# Patient Record
Sex: Female | Born: 1970
Health system: Southern US, Community
[De-identification: ages and names within clinical notes are randomized; demographics above are authoritative.]

## PROBLEM LIST (undated history)

## (undated) DIAGNOSIS — R87619 Unspecified abnormal cytological findings in specimens from cervix uteri: Secondary | ICD-10-CM

## (undated) DIAGNOSIS — N390 Urinary tract infection, site not specified: Secondary | ICD-10-CM

## (undated) DIAGNOSIS — N200 Calculus of kidney: Secondary | ICD-10-CM

## (undated) DIAGNOSIS — F419 Anxiety disorder, unspecified: Secondary | ICD-10-CM

## (undated) DIAGNOSIS — L570 Actinic keratosis: Secondary | ICD-10-CM

## (undated) DIAGNOSIS — G35 Multiple sclerosis: Secondary | ICD-10-CM

## (undated) HISTORY — DX: Anxiety disorder, unspecified: F41.9

## (undated) HISTORY — DX: Urinary tract infection, site not specified: N39.0

## (undated) HISTORY — DX: Calculus of kidney: N20.0

## (undated) HISTORY — DX: Unspecified abnormal cytological findings in specimens from cervix uteri: R87.619

## (undated) HISTORY — DX: Multiple sclerosis: G35

## (undated) HISTORY — DX: Actinic keratosis: L57.0

---

## 2004-03-27 HISTORY — PX: TONSILLECTOMY: SUR1361

## 2004-07-29 ENCOUNTER — Ambulatory Visit: Payer: Self-pay | Admitting: Otolaryngology

## 2006-02-26 ENCOUNTER — Ambulatory Visit: Payer: Self-pay

## 2006-04-26 ENCOUNTER — Inpatient Hospital Stay: Payer: Self-pay | Admitting: Obstetrics & Gynecology

## 2008-03-27 HISTORY — PX: BREAST BIOPSY: SHX20

## 2008-04-14 DIAGNOSIS — C4491 Basal cell carcinoma of skin, unspecified: Secondary | ICD-10-CM

## 2008-04-14 HISTORY — DX: Basal cell carcinoma of skin, unspecified: C44.91

## 2008-05-25 ENCOUNTER — Ambulatory Visit: Payer: Self-pay | Admitting: General Surgery

## 2009-05-19 ENCOUNTER — Ambulatory Visit: Payer: Self-pay | Admitting: Internal Medicine

## 2010-03-27 HISTORY — PX: BREAST BIOPSY: SHX20

## 2010-04-04 ENCOUNTER — Ambulatory Visit: Payer: Self-pay | Admitting: General Surgery

## 2010-12-07 ENCOUNTER — Ambulatory Visit: Payer: Self-pay | Admitting: General Surgery

## 2012-02-13 ENCOUNTER — Ambulatory Visit: Payer: Self-pay | Admitting: Internal Medicine

## 2012-04-05 ENCOUNTER — Ambulatory Visit: Payer: Self-pay | Admitting: Neurology

## 2012-06-26 ENCOUNTER — Encounter: Payer: Self-pay | Admitting: Neurology

## 2012-07-25 ENCOUNTER — Encounter: Payer: Self-pay | Admitting: Neurology

## 2012-09-17 ENCOUNTER — Other Ambulatory Visit: Payer: Self-pay | Admitting: Gastroenterology

## 2012-09-17 LAB — CLOSTRIDIUM DIFFICILE BY PCR

## 2012-09-19 LAB — STOOL CULTURE

## 2013-03-14 ENCOUNTER — Ambulatory Visit: Payer: Self-pay | Admitting: Family Medicine

## 2013-04-18 ENCOUNTER — Ambulatory Visit: Payer: Self-pay | Admitting: Neurology

## 2013-04-18 LAB — HCG, QUANTITATIVE, PREGNANCY: Beta Hcg, Quant.: <1 m[IU]/mL — ABNORMAL LOW

## 2014-01-14 ENCOUNTER — Encounter: Payer: Self-pay | Admitting: Neurology

## 2014-01-25 ENCOUNTER — Encounter: Payer: Self-pay | Admitting: Neurology

## 2014-02-24 ENCOUNTER — Encounter: Payer: Self-pay | Admitting: Neurology

## 2014-03-27 ENCOUNTER — Encounter: Payer: Self-pay | Admitting: Neurology

## 2014-09-01 ENCOUNTER — Other Ambulatory Visit: Payer: Self-pay | Admitting: Neurology

## 2014-09-01 DIAGNOSIS — G35 Multiple sclerosis: Secondary | ICD-10-CM

## 2014-09-11 ENCOUNTER — Ambulatory Visit: Payer: Self-pay

## 2014-09-24 ENCOUNTER — Encounter: Payer: BLUE CROSS/BLUE SHIELD | Attending: Neurology | Admitting: Dietician

## 2014-09-24 ENCOUNTER — Encounter: Payer: Self-pay | Admitting: Dietician

## 2014-09-24 VITALS — Ht 69.0 in | Wt 180.4 lb

## 2014-09-24 DIAGNOSIS — R635 Abnormal weight gain: Secondary | ICD-10-CM | POA: Insufficient documentation

## 2014-09-24 DIAGNOSIS — E669 Obesity, unspecified: Secondary | ICD-10-CM

## 2014-09-24 NOTE — Progress Notes (Signed)
Medical Nutrition Therapy: Visit start time: 13:30 end time: 14:30  Assessment:  Diagnosis: unexplained weight gain Past medical history: MS diagnosed in 2014 Psychosocial issues/ stress concerns: rates her stress as moderate and indicates "ok" as to how she is dealing with her stress. Preferred learning method:  . Audio Current weight: 180.4lbs  Height: 69 in Medications, supplements: see list Progress and evaluation: Patient in for initial nutrition assessment. Reports that despite efforts of exercise and improved diet habits, she has continued to gain weight.Documented gain of 5 lbs in past month. She is interested in a meal plan to promote weight loss but wants to lose weight in a "healthy way". Her current diet is low in fruits/vegetables. There is often more than 6 hours between lunch and dinner making it difficult to control portions at dinner. Continues to make frequent high fat choices. Physical activity: 30 minutes of walking daily. Has recently added 30 minutes on her stationary bike.  Dietary Intake:  Usual eating pattern includes 3 meals and 1 snacks per day. Dining out frequency: 2 meals per week.  Breakfast: 10:00am- granola bar (2 in pkg), water, 8oz. Coffee with 1 tbsp sugar Lunch: 12:00 - hamburger, chips, potato salad, coleslaw or roast beef sandwich with cheese, mustard, mayonnaise, carrots, veggie dip, Supper: rice/beans/broccoli for example or salmon, starch, non-starchy vegetables Snack: ice cream, coffee with sugar Beverages: water, unsweetened tea and coffee (2 c./day)  Nutrition Care Education: Weight control:Instructed on a meal plan based on 1500 calories, including carbohydrate counting and balance of carbohydrate, protein and fat. Discussed how to adjust portions of current meals and ways to increase fruit/vegetable intake. Also, instructed on reading labels. Exercise: Commended on her exercise and suggested some light weight bearing exercises to help with  maintaining muscle mass. Nutritional Diagnosis:  [place]-3.4 Unintentional weight gain As related to higher fat food choices, added fats and large portions at evening meals.  As evidenced by weight gain.  Intervention:  Balance meals with 2-3 servings of carbohydrate, protein and non-starchy vegetables. Spread 10 servings of carbohydrate over the day. Include at least 1 oz. protein at breakfast. Add a small serving of fruit when possible at breakfast. Add an afternoon snack that consists of 1 oz. Protein and 1 serving of carbohydrate. Include at least 5 servings of fruits/veg. daily. Limit wine to 5 oz. on Friday and Saturday. Limit sugar in coffee to 1 tsp. In each of 2 cups.  Education Materials given:  . Food lists/ Planning A Balanced Meal . Sample meal pattern/ menus . Goals/ instructions  Learner/ who was taught:  . Patient   Level of understanding: . Partial understanding; needs review/ practice Learning barriers: . None  Willingness to learn/ readiness for change: . Eager, change in progress  Monitoring and Evaluation:  Dietary intake, exercise,, and body weight      follow up: July 25,2016

## 2014-09-24 NOTE — Patient Instructions (Signed)
Balance meals with 2-3 servings of carbohydrate, protein and non-starchy vegetables. Include at least 1 oz. protein at breakfast. Add a small serving of fruit when possible. Add an afternoon snack that consists of 1 oz. Protein and 1 serving of carbohydrate. Include at least 5 servings of fruits/veg. daily. Limit wine to 5 oz. on Friday and Saturday. Limit sugar in coffee to 1 tsp. In each of 2 cups.

## 2014-10-06 ENCOUNTER — Ambulatory Visit
Admission: RE | Admit: 2014-10-06 | Discharge: 2014-10-06 | Disposition: A | Discharge status: Home / Self Care | Payer: BLUE CROSS/BLUE SHIELD | Source: Ambulatory Visit | Attending: Neurology | Admitting: Neurology

## 2014-10-06 DIAGNOSIS — G35 Multiple sclerosis: Secondary | ICD-10-CM | POA: Insufficient documentation

## 2014-10-06 DIAGNOSIS — G959 Disease of spinal cord, unspecified: Secondary | ICD-10-CM | POA: Insufficient documentation

## 2014-10-06 MED ORDER — GADOBENATE DIMEGLUMINE 529 MG/ML IV SOLN
20.0000 mL | Freq: Once | INTRAVENOUS | Status: AC | PRN
  Administered 2014-10-06: 16 mL via INTRAVENOUS

## 2014-10-19 ENCOUNTER — Encounter: Payer: Self-pay | Admitting: Dietician

## 2014-10-19 ENCOUNTER — Encounter: Payer: BLUE CROSS/BLUE SHIELD | Attending: Neurology | Admitting: Dietician

## 2014-10-19 VITALS — Wt 176.2 lb

## 2014-10-19 DIAGNOSIS — R635 Abnormal weight gain: Secondary | ICD-10-CM

## 2014-10-19 NOTE — Progress Notes (Signed)
Medical Nutrition Therapy Follow-up visit:  Time:13:00 - 13:55 Visit #: 2 ASSESSMENT:    Current weight:176.2 lbs    Height:69 in Medications: See list Medical History: MS since 2014 Progress and evaluation: Patient has lost 4.4 lbs since previous visit 1 month ago. She has followed through with goals to better balance meals, increase vegetables, decrease added sugar (she now adds stevia to her coffee instead of sugar), and to include an afternoon snack to help control portions at dinner meals. She states she has not had a cheeseburger since her last visit. She has added [place] yogurt to her breakfast which she states is helping to be less hungry at lunch.  Physical activity: walks on days that are not excessively hot. The heat aggravates her MS symptoms. She rides her exercise bike daily for 30 minutes.   NUTRITION CARE EDUCATION:  Weight control: Commended patient on following through with goals from initial visit. Reviewed 1500 calorie meal plan instructed on at initial visit. Discussed ways to increase fiber through more whole grains and fruits/vegetables. Also, discussed calcium needs and sources.  INTERVENTION:  Increase fiber by increasing whole grains: Ex. Fiber One Clusters,   100% whole wheat bread, Quinoa, corn, brown or wild rice, or 100% whole wheat pasta Include at least 1 cup of vegetables at dinner. Add stevia to strawberries to help sweeten it. Include yogurt 2x/day. Continue with previous goals.   EDUCATION MATERIALS GIVEN:  . Goals/ instructions . Other : List of foods and fiber content  LEARNER/ who was taught:  . Patient   LEVEL OF UNDERSTANDING: . Verbalizes/ demonstrates competency LEARNING BARRIERS: . None  WILLINGNESS TO LEARN/READINESS FOR CHANGE: . Eager, change in progress MONITORING AND EVALUATION:  Diet, exercise and weight Follow-up: 11/18/14 at 8:00am.

## 2014-10-19 NOTE — Patient Instructions (Signed)
Increase fiber by increasing whole grains: Ex. Fiber One Clusters,   100% whole wheat bread, Quinoa, corn, brown or wild rice, or 100% whole wheat pasta Include at least 1 cup of vegetables at dinner. Add stevia to strawberries to help sweeten it. Include yogurt 2x/day.

## 2014-11-18 ENCOUNTER — Encounter: Payer: Self-pay | Admitting: Dietician

## 2014-11-18 ENCOUNTER — Encounter: Payer: BLUE CROSS/BLUE SHIELD | Attending: Neurology | Admitting: Dietician

## 2014-11-18 VITALS — Ht 69.0 in | Wt 173.5 lb

## 2014-11-18 DIAGNOSIS — R635 Abnormal weight gain: Secondary | ICD-10-CM | POA: Insufficient documentation

## 2014-11-18 NOTE — Progress Notes (Signed)
Medical Nutrition Therapy Follow-up visit:  Time:8:00-8:45 Visit #:3 ASSESSMENT:  Diagnosis:unexplained weight gain  Current weight: 173.5 lbs    Height: 69 in Medications: See list Medical History: MS since 2014 Progress and evaluation:Patient has lost 2.7 lbs in past month. She has followed through with previous goals set to increase fiber by increasing whole grain foods. She tried Fiber One Clusters and states "I love that cereal". She eats the cereal for evening snack. She eats a vegetable serving at lunch (carrots) and includes 1 to 1.5 cups of vegetables for her evening meal. She is including yogurt, cheese and milk daily for 3 servings of milk product/day. She is meeting protein needs through her milk products, lean meats at lunch and dinner and a protein bar for afternoon snack. Physical activity:walks 30 minutes per day; also uses exercise bike 30 minutes/day.   NUTRITION CARE EDUCATION:  Weight control: Commended patient on her continued efforts to lose weight through healthy diet habits and exercise. Reviewed food group servings needed and compared to her current intake. Also, discussed benefit of adding weight bearing exercises to help maintain muscle over time.  INTERVENTION:  Consider adding weight bearing exercises 3 days per week. Continue with previous goals. Add 1 fruit serving per day.  EDUCATION MATERIALS GIVEN:  . Goals/ instructions LEARNER/ who was taught:  . Patient   LEVEL OF UNDERSTANDING: . Verbalizes/ demonstrates competency LEARNING BARRIERS: . None  WILLINGNESS TO LEARN/READINESS FOR CHANGE: . Eager, change in progress MONITORING AND EVALUATION:  Follow-up to monitor diet, weight, exercise Sept. 28th at 8:00am.

## 2014-11-18 NOTE — Patient Instructions (Signed)
Consider adding weight bearing exercises 3 days per week. Continue with previous goals. Add 1 fruit serving per day.

## 2014-12-23 ENCOUNTER — Ambulatory Visit: Payer: BLUE CROSS/BLUE SHIELD | Admitting: Dietician

## 2015-01-14 ENCOUNTER — Ambulatory Visit: Payer: BLUE CROSS/BLUE SHIELD | Admitting: Dietician

## 2015-01-28 ENCOUNTER — Encounter: Payer: Self-pay | Admitting: Dietician

## 2015-08-27 DIAGNOSIS — F411 Generalized anxiety disorder: Secondary | ICD-10-CM | POA: Diagnosis not present

## 2015-08-27 DIAGNOSIS — F330 Major depressive disorder, recurrent, mild: Secondary | ICD-10-CM | POA: Diagnosis not present

## 2015-08-27 DIAGNOSIS — F3281 Premenstrual dysphoric disorder: Secondary | ICD-10-CM | POA: Diagnosis not present

## 2015-10-06 ENCOUNTER — Other Ambulatory Visit: Payer: Self-pay

## 2015-10-06 ENCOUNTER — Ambulatory Visit: Payer: BLUE CROSS/BLUE SHIELD

## 2015-10-25 DIAGNOSIS — Z79899 Other long term (current) drug therapy: Secondary | ICD-10-CM | POA: Diagnosis not present

## 2015-10-25 DIAGNOSIS — G35 Multiple sclerosis: Secondary | ICD-10-CM | POA: Diagnosis not present

## 2015-11-23 DIAGNOSIS — F330 Major depressive disorder, recurrent, mild: Secondary | ICD-10-CM | POA: Diagnosis not present

## 2015-11-23 DIAGNOSIS — F3281 Premenstrual dysphoric disorder: Secondary | ICD-10-CM | POA: Diagnosis not present

## 2015-11-23 DIAGNOSIS — F411 Generalized anxiety disorder: Secondary | ICD-10-CM | POA: Diagnosis not present

## 2015-12-07 DIAGNOSIS — Z1283 Encounter for screening for malignant neoplasm of skin: Secondary | ICD-10-CM | POA: Diagnosis not present

## 2015-12-07 DIAGNOSIS — L700 Acne vulgaris: Secondary | ICD-10-CM | POA: Diagnosis not present

## 2015-12-07 DIAGNOSIS — L219 Seborrheic dermatitis, unspecified: Secondary | ICD-10-CM | POA: Diagnosis not present

## 2015-12-07 DIAGNOSIS — Z85828 Personal history of other malignant neoplasm of skin: Secondary | ICD-10-CM | POA: Diagnosis not present

## 2015-12-07 DIAGNOSIS — L820 Inflamed seborrheic keratosis: Secondary | ICD-10-CM | POA: Diagnosis not present

## 2015-12-30 DIAGNOSIS — Z23 Encounter for immunization: Secondary | ICD-10-CM | POA: Diagnosis not present

## 2016-02-24 DIAGNOSIS — F411 Generalized anxiety disorder: Secondary | ICD-10-CM | POA: Diagnosis not present

## 2016-02-24 DIAGNOSIS — N3000 Acute cystitis without hematuria: Secondary | ICD-10-CM | POA: Diagnosis not present

## 2016-02-24 DIAGNOSIS — F330 Major depressive disorder, recurrent, mild: Secondary | ICD-10-CM | POA: Diagnosis not present

## 2016-02-24 DIAGNOSIS — F3281 Premenstrual dysphoric disorder: Secondary | ICD-10-CM | POA: Diagnosis not present

## 2016-02-24 DIAGNOSIS — N390 Urinary tract infection, site not specified: Secondary | ICD-10-CM | POA: Diagnosis not present

## 2016-02-25 DIAGNOSIS — N3000 Acute cystitis without hematuria: Secondary | ICD-10-CM | POA: Diagnosis not present

## 2016-02-25 DIAGNOSIS — N390 Urinary tract infection, site not specified: Secondary | ICD-10-CM | POA: Diagnosis not present

## 2016-03-06 DIAGNOSIS — N390 Urinary tract infection, site not specified: Secondary | ICD-10-CM | POA: Diagnosis not present

## 2016-03-06 DIAGNOSIS — R300 Dysuria: Secondary | ICD-10-CM | POA: Diagnosis not present

## 2016-03-14 DIAGNOSIS — F330 Major depressive disorder, recurrent, mild: Secondary | ICD-10-CM | POA: Diagnosis not present

## 2016-03-14 DIAGNOSIS — F3281 Premenstrual dysphoric disorder: Secondary | ICD-10-CM | POA: Diagnosis not present

## 2016-03-14 DIAGNOSIS — F411 Generalized anxiety disorder: Secondary | ICD-10-CM | POA: Diagnosis not present

## 2016-04-26 DIAGNOSIS — G35 Multiple sclerosis: Secondary | ICD-10-CM | POA: Diagnosis not present

## 2016-04-26 DIAGNOSIS — Z79899 Other long term (current) drug therapy: Secondary | ICD-10-CM | POA: Diagnosis not present

## 2016-04-27 DIAGNOSIS — F3281 Premenstrual dysphoric disorder: Secondary | ICD-10-CM | POA: Diagnosis not present

## 2016-04-27 DIAGNOSIS — F411 Generalized anxiety disorder: Secondary | ICD-10-CM | POA: Diagnosis not present

## 2016-04-27 DIAGNOSIS — F330 Major depressive disorder, recurrent, mild: Secondary | ICD-10-CM | POA: Diagnosis not present

## 2016-05-16 DIAGNOSIS — F411 Generalized anxiety disorder: Secondary | ICD-10-CM | POA: Diagnosis not present

## 2016-05-16 DIAGNOSIS — F3281 Premenstrual dysphoric disorder: Secondary | ICD-10-CM | POA: Diagnosis not present

## 2016-05-16 DIAGNOSIS — F330 Major depressive disorder, recurrent, mild: Secondary | ICD-10-CM | POA: Diagnosis not present

## 2016-06-20 DIAGNOSIS — L281 Prurigo nodularis: Secondary | ICD-10-CM | POA: Diagnosis not present

## 2016-06-20 DIAGNOSIS — D485 Neoplasm of uncertain behavior of skin: Secondary | ICD-10-CM | POA: Diagnosis not present

## 2016-06-20 DIAGNOSIS — Z85828 Personal history of other malignant neoplasm of skin: Secondary | ICD-10-CM | POA: Diagnosis not present

## 2016-06-20 DIAGNOSIS — Z1283 Encounter for screening for malignant neoplasm of skin: Secondary | ICD-10-CM | POA: Diagnosis not present

## 2016-06-20 DIAGNOSIS — D229 Melanocytic nevi, unspecified: Secondary | ICD-10-CM | POA: Diagnosis not present

## 2016-06-20 DIAGNOSIS — L578 Other skin changes due to chronic exposure to nonionizing radiation: Secondary | ICD-10-CM | POA: Diagnosis not present

## 2016-07-04 ENCOUNTER — Encounter: Payer: Self-pay | Admitting: Obstetrics and Gynecology

## 2016-07-04 ENCOUNTER — Ambulatory Visit (INDEPENDENT_AMBULATORY_CARE_PROVIDER_SITE_OTHER): Payer: BLUE CROSS/BLUE SHIELD | Admitting: Obstetrics and Gynecology

## 2016-07-04 VITALS — BP 124/82 | HR 94 | Ht 69.0 in | Wt 184.0 lb

## 2016-07-04 DIAGNOSIS — Z1231 Encounter for screening mammogram for malignant neoplasm of breast: Secondary | ICD-10-CM | POA: Diagnosis not present

## 2016-07-04 DIAGNOSIS — Z01419 Encounter for gynecological examination (general) (routine) without abnormal findings: Secondary | ICD-10-CM

## 2016-07-04 DIAGNOSIS — Z1239 Encounter for other screening for malignant neoplasm of breast: Secondary | ICD-10-CM

## 2016-07-04 NOTE — Progress Notes (Signed)
Chief Complaint  Patient presents with  . Gynecologic Exam     HPI:      [name] is a 46 y.o. G2P1011 who LMP was Patient's last menstrual period was 06/30/2016., presents today for her annual examination.  Her menses are regular every 25 days, lasting 4 days.  Dysmenorrhea none. She does not have intermenstrual bleeding.  Sex activity: single partner, contraception - vasectomy.  Last Pap: April 15, 2015  Results were: no abnormalities /neg HPV DNA  Hx of STDs: none  Last mammogram: April 15, 2015  Results were: normal--routine follow-up in 12 months There is no FH of breast cancer. There is no FH of ovarian cancer. The patient does not do self-breast exams.  Tobacco use: The patient denies current or previous tobacco use. Alcohol use: none Exercise: moderately active  She does get adequate calcium and Vitamin D in her diet.  Her UTI sx from 11/17 and 12/17 have resolved.   Past Medical History:  Diagnosis Date  . Abnormal Pap smear of cervix   . Anxiety   . Kidney stones   . Neuromuscular disorder [facility])     Past Surgical History:  Procedure Laterality Date  . CESAREAN SECTION  2008   [facility]    Family History  Problem Relation Age of Onset  . Endometrial cancer Mother 71  . Multiple sclerosis Sister 48  . Autism Son     Infantile  . Diabetes type II Other     Aunt   Social History   Social History  . Marital status: Married    Spouse name: N/A  . Number of children: N/A  . Years of education: N/A   Occupational History  . Not on file.   Social History Main Topics  . Smoking status: Never Smoker  . Smokeless tobacco: Never Used  . Alcohol use 2.4 oz/week    4 Glasses of wine per week  . Drug use: No  . Sexual activity: Yes    Birth control/ protection: Surgical     Comment: Vasectomy   Other Topics Concern  . Not on file   Social History Narrative  . No narrative on file    Current Outpatient Prescriptions:  .  baclofen  (LIORESAL) 10 MG tablet, 10 mg 2 (two) times daily., Disp: , Rfl:  .  buPROPion (WELLBUTRIN) 100 MG tablet, Take 100 mg by mouth 2 (two) times daily., Disp: , Rfl:  .  Cholecalciferol (PA VITAMIN D-3) 2000 UNITS CAPS, Take 2,000 Units by mouth., Disp: , Rfl:  .  cyanocobalamin 2000 MCG tablet, Take 2,000 mcg by mouth daily., Disp: , Rfl:  .  dalfampridine (AMPYRA) 10 MG TB12, Take 10 mg by mouth 2 (two) times daily., Disp: , Rfl:  .  Dimethyl Fumarate (TECFIDERA) 240 MG CPDR, 240 mg 1 day or 1 dose., Disp: , Rfl:  .  sertraline (ZOLOFT) 50 MG tablet, Take 100 mg by mouth., Disp: , Rfl:   ROS:  Review of Systems  Constitutional: Negative for fever, malaise/fatigue and weight loss.  HENT: Negative for congestion, ear pain and sinus pain.   Respiratory: Negative for cough, shortness of breath and wheezing.   Cardiovascular: Negative for chest pain, orthopnea and leg swelling.  Gastrointestinal: Negative for constipation, diarrhea, nausea and vomiting.  Genitourinary: Negative for dysuria, frequency, hematuria and urgency.       Breast ROS: negative   Musculoskeletal: Negative for back pain, joint pain and myalgias.  Skin: Negative for itching and  rash.  Neurological: Negative for dizziness, tingling, focal weakness and headaches.  Endo/Heme/Allergies: Negative for environmental allergies. Does not bruise/bleed easily.  Psychiatric/Behavioral: Negative for depression and suicidal ideas. The patient is not nervous/anxious and does not have insomnia.     Objective: BP 124/82 (BP Location: Left Arm, Patient Position: Sitting, Cuff Size: Normal)   Pulse 94   Ht 5\' 9"  (1.753 m)   Wt 184 lb (83.5 kg)   LMP 06/30/2016   BMI 27.17 kg/m    Physical Exam  Constitutional: She is oriented to person, place, and time. She appears well-developed and well-nourished.  Genitourinary: Vagina normal and uterus normal. No erythema or tenderness in the vagina. No vaginal discharge found. Right adnexum  does not display mass and does not display tenderness. Left adnexum does not display mass and does not display tenderness. Cervix does not exhibit motion tenderness or polyp. Uterus is not enlarged or tender.  Neck: Normal range of motion. No thyromegaly present.  Cardiovascular: Normal rate, regular rhythm and normal heart sounds.   No murmur heard. Pulmonary/Chest: Effort normal and breath sounds normal. Right breast exhibits no mass, no nipple discharge, no skin change and no tenderness. Left breast exhibits no mass, no nipple discharge, no skin change and no tenderness.  Abdominal: Soft. There is no tenderness. There is no guarding.  Musculoskeletal: Normal range of motion.  Neurological: She is alert and oriented to person, place, and time. No cranial nerve deficit.  Psychiatric: She has a normal mood and affect. Her behavior is normal.  Vitals reviewed.   Assessment/Plan: Encounter for annual routine gynecological examination  Screening for breast cancer - Plan: MM DIGITAL SCREENING BILATERAL              GYN counsel mammography screening, menopause, adequate intake of calcium and vitamin D     F/U  No Follow-up on file.  Tobey Schmelzle B. Nino Amano, PA-C 07/04/2016 2:12 PM

## 2016-07-18 ENCOUNTER — Ambulatory Visit
Admission: RE | Admit: 2016-07-18 | Discharge: 2016-07-18 | Disposition: A | Discharge status: Home / Self Care | Payer: BLUE CROSS/BLUE SHIELD | Source: Ambulatory Visit | Attending: Obstetrics and Gynecology | Admitting: Obstetrics and Gynecology

## 2016-07-18 DIAGNOSIS — Z1231 Encounter for screening mammogram for malignant neoplasm of breast: Secondary | ICD-10-CM | POA: Diagnosis not present

## 2016-07-18 DIAGNOSIS — Z1239 Encounter for other screening for malignant neoplasm of breast: Secondary | ICD-10-CM

## 2016-07-27 ENCOUNTER — Inpatient Hospital Stay
Admission: RE | Admit: 2016-07-27 | Discharge: 2016-07-27 | Disposition: A | Discharge status: Home / Self Care | Payer: Self-pay | Source: Ambulatory Visit | Attending: *Deleted | Admitting: *Deleted

## 2016-07-27 ENCOUNTER — Other Ambulatory Visit: Payer: Self-pay | Admitting: *Deleted

## 2016-07-27 DIAGNOSIS — Z9289 Personal history of other medical treatment: Secondary | ICD-10-CM

## 2016-08-09 DIAGNOSIS — F3281 Premenstrual dysphoric disorder: Secondary | ICD-10-CM | POA: Diagnosis not present

## 2016-08-09 DIAGNOSIS — F411 Generalized anxiety disorder: Secondary | ICD-10-CM | POA: Diagnosis not present

## 2016-08-09 DIAGNOSIS — F330 Major depressive disorder, recurrent, mild: Secondary | ICD-10-CM | POA: Diagnosis not present

## 2016-08-18 DIAGNOSIS — S0501XA Injury of conjunctiva and corneal abrasion without foreign body, right eye, initial encounter: Secondary | ICD-10-CM | POA: Diagnosis not present

## 2016-08-24 DIAGNOSIS — S0501XD Injury of conjunctiva and corneal abrasion without foreign body, right eye, subsequent encounter: Secondary | ICD-10-CM | POA: Diagnosis not present

## 2016-09-19 DIAGNOSIS — G35 Multiple sclerosis: Secondary | ICD-10-CM | POA: Diagnosis not present

## 2016-09-19 DIAGNOSIS — Z79899 Other long term (current) drug therapy: Secondary | ICD-10-CM | POA: Diagnosis not present

## 2016-09-29 ENCOUNTER — Other Ambulatory Visit: Payer: Self-pay | Admitting: Neurology

## 2016-09-29 DIAGNOSIS — G35 Multiple sclerosis: Secondary | ICD-10-CM

## 2016-10-05 ENCOUNTER — Ambulatory Visit
Admission: RE | Admit: 2016-10-05 | Discharge: 2016-10-05 | Disposition: A | Discharge status: Home / Self Care | Payer: BLUE CROSS/BLUE SHIELD | Source: Ambulatory Visit | Attending: Neurology | Admitting: Neurology

## 2016-10-05 DIAGNOSIS — G35 Multiple sclerosis: Secondary | ICD-10-CM

## 2016-10-05 DIAGNOSIS — M5021 Other cervical disc displacement,  high cervical region: Secondary | ICD-10-CM | POA: Diagnosis not present

## 2016-10-05 MED ORDER — GADOBENATE DIMEGLUMINE 529 MG/ML IV SOLN
17.0000 mL | Freq: Once | INTRAVENOUS | Status: AC | PRN
  Administered 2016-10-05: 17 mL via INTRAVENOUS

## 2016-10-23 ENCOUNTER — Encounter: Payer: Self-pay | Admitting: Urology

## 2016-10-23 ENCOUNTER — Ambulatory Visit: Payer: BLUE CROSS/BLUE SHIELD | Admitting: Urology

## 2016-10-23 VITALS — BP 125/80 | HR 91 | Ht 69.0 in | Wt 184.3 lb

## 2016-10-23 DIAGNOSIS — R3912 Poor urinary stream: Secondary | ICD-10-CM | POA: Diagnosis not present

## 2016-10-23 NOTE — Progress Notes (Signed)
10/23/2016 11:55 AM   Michelle Ruiz 10-01-1970 [id number]  Referring provider: Vladimir Crofts, MD [facility] [place], [place] [address]  Chief Complaint  Patient presents with  . New Patient (Initial Visit)    Neurogenic bladder    HPI: I was consulted to assess the patient's flow symptoms and likely neurogenic bladder. She has had multiple sclerosis for 5 years. For a number of years she has a poor flow and hesitates. She stops and starts. She has decreased sensation. She will sit and tried her relaxants and double void of varying amount. She took a medication that may have been Flomax and it causes pressure and the inability to urinate  Once the year she gets a bladder infection. She is continent. She voids every 2 or 3 hours and has nocturia 1. The patient thinks her sensation is still present but just a little bit less near the vagina and rectum  She denies a history of kidney stones or previous GU surgery. She has not had a hysterectomy  Modifying factors: There are no other modifying factors  Associated signs and symptoms: There are no other associated signs and symptoms Aggravating and relieving factors: There are no other aggravating or relieving factors Severity: Moderate Duration: Persistent   PMH: Past Medical History:  Diagnosis Date  . Abnormal Pap smear of cervix   . Anxiety   . Kidney stones   . Neuromuscular disorder [facility])     Surgical History: Past Surgical History:  Procedure Laterality Date  . BREAST BIOPSY Right 2010   stereotactic biopsy/ neg/ dr.byrnett  . BREAST BIOPSY Left 2012   neg/dr. brynett  . CESAREAN SECTION  2008   [name]    Home Medications:  Allergies as of 10/23/2016      Reactions   Amoxicillin Other (See Comments)   Other Other (See Comments)   Sulfa Antibiotics    Other reaction(s): Unknown      Medication List       Accurate as of 10/23/16 11:55 AM. Always use your most recent med  list.          AMPYRA 10 MG Tb12 Generic drug:  dalfampridine Take 10 mg by mouth 2 (two) times daily.   baclofen 10 MG tablet Commonly known as:  LIORESAL 10 mg 2 (two) times daily.   buPROPion 100 MG tablet Commonly known as:  WELLBUTRIN Take 100 mg by mouth 2 (two) times daily.   cyanocobalamin 2000 MCG tablet Take 2,000 mcg by mouth daily.   PA VITAMIN D-3 2000 units Caps Generic drug:  Cholecalciferol Take 2,000 Units by mouth.   sertraline 50 MG tablet Commonly known as:  ZOLOFT Take 100 mg by mouth.   TECFIDERA 240 MG Cpdr Generic drug:  Dimethyl Fumarate 240 mg 1 day or 1 dose.       Allergies:  Allergies  Allergen Reactions  . Amoxicillin Other (See Comments)  . Other Other (See Comments)  . Sulfa Antibiotics     Other reaction(s): Unknown    Family History: Family History  Problem Relation Age of Onset  . Endometrial cancer Mother 63  . Multiple sclerosis Sister 35  . Autism Son        Infantile  . Diabetes type II Other        Aunt  . Bladder Cancer Neg Hx   . Kidney cancer Neg Hx     Social History:  reports that she has never smoked. She has  never used smokeless tobacco. She reports that she drinks about 2.4 oz of alcohol per week . She reports that she does not use drugs.  ROS: UROLOGY Frequent Urination?: No Hard to postpone urination?: No Burning/pain with urination?: No Get up at night to urinate?: Yes Leakage of urine?: No Urine stream starts and stops?: Yes Trouble starting stream?: Yes Do you have to strain to urinate?: Yes Blood in urine?: No Urinary tract infection?: No Sexually transmitted disease?: No Injury to kidneys or bladder?: No Painful intercourse?: No Weak stream?: Yes Currently pregnant?: No Vaginal bleeding?: No Last menstrual period?: n  Gastrointestinal Nausea?: No Vomiting?: No Indigestion/heartburn?: No Diarrhea?: No Constipation?: No  Constitutional Fever: No Night sweats?: No Weight loss?:  No Fatigue?: No  Skin Skin rash/lesions?: No Itching?: No  Eyes Blurred vision?: No Double vision?: No  Ears/Nose/Throat Sore throat?: No Sinus problems?: No  Hematologic/Lymphatic Swollen glands?: No Easy bruising?: No  Cardiovascular Leg swelling?: No Chest pain?: No  Respiratory Cough?: No Shortness of breath?: No  Endocrine Excessive thirst?: No  Musculoskeletal Back pain?: No Joint pain?: No  Neurological Headaches?: No Dizziness?: No  Psychologic Depression?: No Anxiety?: No  Physical Exam: BP 125/80 (BP Location: Left Arm, Patient Position: Sitting, Cuff Size: Normal)   Pulse 91   Ht 5\' 9"  (1.753 m)   Wt 184 lb 4.8 oz (83.6 kg)   BMI 27.22 kg/m   Constitutional:  Alert and oriented, No acute distress. HEENT: [place] AT, moist mucus membranes.  Trachea midline, no masses. Cardiovascular: No clubbing, cyanosis, or edema. Respiratory: Normal respiratory effort, no increased work of breathing. GI: Abdomen is soft, nontender, nondistended, no abdominal masses GU: No CVA tenderness. No prolapse. Possible mild decrease in sensation of the introitus Skin: No rashes, bruises or suspicious lesions. Lymph: No cervical or inguinal adenopathy. Neurologic: Grossly intact, no focal deficits, moving all 4 extremities. Psychiatric: Normal mood and affect.  Laboratory Data:  Urinalysis No results found for: COLORURINE, APPEARANCEUR, LABSPEC, PHURINE, GLUCOSEU, HGBUR, BILIRUBINUR, KETONESUR, PROTEINUR, UROBILINOGEN, NITRITE, LEUKOCYTESUR  Pertinent Imaging: None  Assessment & Plan:  The patient has significant flow symptoms in keeping with a diagnosis of neurogenic bladder secondary to multiple sclerosis. The role of urodynamics and baseline renal ultrasound was discussed including the concept of high bladder pressures. She did not void today so a residual was not checked. I could not confirm from the chart that she took Flomax but she did remember that name.  Pathophysiology of retention discussed     There are no diagnoses linked to this encounter.  No Follow-up on file.  Reece Packer, MD  [facility] [address] [place],  [address] [phone]

## 2016-11-01 ENCOUNTER — Telehealth: Payer: Self-pay | Admitting: Urology

## 2016-11-01 NOTE — Telephone Encounter (Signed)
Can you get with Dr.Macdiarmid on Monday and make sure he really wanted this patient to have a RUS? He ordered on and mentioned something about it in her chart but she said she was not told about it. I told her I would have you ask and let me know on Tuesday and I will call her.  Thanks, Sharyn Lull

## 2016-11-06 NOTE — Telephone Encounter (Signed)
Per MacDiarmid No ultrasound needed yet

## 2016-11-10 DIAGNOSIS — F411 Generalized anxiety disorder: Secondary | ICD-10-CM | POA: Diagnosis not present

## 2016-11-10 DIAGNOSIS — F330 Major depressive disorder, recurrent, mild: Secondary | ICD-10-CM | POA: Diagnosis not present

## 2016-11-10 DIAGNOSIS — F3281 Premenstrual dysphoric disorder: Secondary | ICD-10-CM | POA: Diagnosis not present

## 2016-12-21 DIAGNOSIS — R3911 Hesitancy of micturition: Secondary | ICD-10-CM | POA: Diagnosis not present

## 2016-12-21 DIAGNOSIS — R351 Nocturia: Secondary | ICD-10-CM | POA: Diagnosis not present

## 2016-12-21 DIAGNOSIS — N311 Reflex neuropathic bladder, not elsewhere classified: Secondary | ICD-10-CM | POA: Diagnosis not present

## 2016-12-22 ENCOUNTER — Other Ambulatory Visit: Payer: Self-pay | Admitting: Urology

## 2016-12-30 DIAGNOSIS — Z23 Encounter for immunization: Secondary | ICD-10-CM | POA: Diagnosis not present

## 2017-01-01 ENCOUNTER — Ambulatory Visit (INDEPENDENT_AMBULATORY_CARE_PROVIDER_SITE_OTHER): Payer: BLUE CROSS/BLUE SHIELD | Admitting: Urology

## 2017-01-01 VITALS — BP 139/91 | HR 87 | Ht 69.0 in | Wt 186.9 lb

## 2017-01-01 DIAGNOSIS — N1330 Unspecified hydronephrosis: Secondary | ICD-10-CM | POA: Diagnosis not present

## 2017-01-01 MED ORDER — TAMSULOSIN HCL 0.4 MG PO CAPS: 0.4000 mg | ORAL_CAPSULE | Freq: Every day | ORAL | 11 refills | Status: DC

## 2017-01-01 NOTE — Progress Notes (Signed)
01/01/2017 9:51 AM   Michelle Ruiz 12/07/1970 [id number]  Referring provider: No referring provider defined for this encounter.  Chief Complaint  Patient presents with  . Follow-up    UDS results    HPI: I was consulted to assess the patient's flow symptoms and likely neurogenic bladder. She has had multiple sclerosis for 5 years. For a number of years she has a poor flow and hesitates. She stops and starts. She has decreased sensation. She will sit and tried her relaxants and double void of varying amount. She took a medication that may have been Flomax and it causes pressure and the inability to urinate  Once the year she gets a bladder infection. She is continent. She voids every 2 or 3 hours and has nocturia 1. The patient thinks her sensation is still present but just a little bit less near the vagina and rectum   The patient has significant flow symptoms in keeping with a diagnosis of neurogenic bladder secondary to multiple sclerosis. The role of urodynamics and baseline renal ultrasound was discussed including the concept of high bladder pressures. She did not void today so a residual was not checked. I could not confirm from the chart that she took Flomax but she did remember that name. Pathophysiology of retention discussed  Today It appears the renal ultrasound was not done as noted Frequency and flow symptoms persisting On urodynamics she did not void was catheterized for 300 mL. Bladder capacity was 578 mL. Bladder was unstable reaching a pressure of 7 cm water and she did not leak. She did not leak with a Valsalva pressure of 78 cm of water. She could not voluntarily void. She tried for a long time. She generated contraction of 6 cm of water but it was not well sustained. She does have a bashful bladder. EMG activity was unremarkable. Bladder neck descent it 1 cm. Importantly she voided 550 mL after the study. The details of the urodynamics are signed and  dictated   PMH: Past Medical History:  Diagnosis Date  . Abnormal Pap smear of cervix   . Anxiety   . Kidney stones   . Neuromuscular disorder [facility])     Surgical History: Past Surgical History:  Procedure Laterality Date  . BREAST BIOPSY Right 2010   stereotactic biopsy/ neg/ dr.byrnett  . BREAST BIOPSY Left 2012   neg/dr. brynett  . CESAREAN SECTION  2008   [facility]    Home Medications:  Allergies as of 01/01/2017      Reactions   Amoxicillin Other (See Comments)   Other Other (See Comments)   Sulfa Antibiotics    Other reaction(s): Unknown      Medication List       Accurate as of 01/01/17  9:51 AM. Always use your most recent med list.          AMPYRA 10 MG Tb12 Generic drug:  dalfampridine Take 10 mg by mouth 2 (two) times daily.   baclofen 10 MG tablet Commonly known as:  LIORESAL 10 mg 2 (two) times daily.   buPROPion 100 MG tablet Commonly known as:  WELLBUTRIN Take 100 mg by mouth 2 (two) times daily.   cyanocobalamin 2000 MCG tablet Take 2,000 mcg by mouth daily.   PA VITAMIN D-3 2000 units Caps Generic drug:  Cholecalciferol Take 2,000 Units by mouth.   sertraline 50 MG tablet Commonly known as:  ZOLOFT Take 100 mg by mouth.   TECFIDERA 240 MG Cpdr Generic drug:  Dimethyl Fumarate 240 mg 1 day or 1 dose.       Allergies:  Allergies  Allergen Reactions  . Amoxicillin Other (See Comments)  . Other Other (See Comments)  . Sulfa Antibiotics     Other reaction(s): Unknown    Family History: Family History  Problem Relation Age of Onset  . Endometrial cancer Mother 73  . Multiple sclerosis Sister 27  . Autism Son        Infantile  . Diabetes type II Other        Aunt  . Bladder Cancer Neg Hx   . Kidney cancer Neg Hx     Social History:  reports that she has never smoked. She has never used smokeless tobacco. She reports that she drinks about 2.4 oz of alcohol per week . She reports that she does not use  drugs.  ROS: UROLOGY Frequent Urination?: No Hard to postpone urination?: No Burning/pain with urination?: No Get up at night to urinate?: No Leakage of urine?: No Urine stream starts and stops?: No Trouble starting stream?: Yes Do you have to strain to urinate?: No Blood in urine?: No Urinary tract infection?: No Sexually transmitted disease?: No Injury to kidneys or bladder?: No Painful intercourse?: No Weak stream?: Yes Currently pregnant?: No Vaginal bleeding?: No Last menstrual period?: n  Gastrointestinal Nausea?: No Vomiting?: No Indigestion/heartburn?: No Diarrhea?: No Constipation?: No  Constitutional Fever: No Night sweats?: No Weight loss?: No Fatigue?: No  Skin Skin rash/lesions?: No Itching?: No  Eyes Blurred vision?: No Double vision?: No  Ears/Nose/Throat Sore throat?: No Sinus problems?: No  Hematologic/Lymphatic Swollen glands?: No Easy bruising?: No  Cardiovascular Leg swelling?: No Chest pain?: No  Respiratory Cough?: No Shortness of breath?: No  Endocrine Excessive thirst?: No  Musculoskeletal Back pain?: No Joint pain?: No  Neurological Headaches?: No Dizziness?: No  Psychologic Depression?: No Anxiety?: No  Physical Exam: BP (!) 139/91 (BP Location: Right Arm, Patient Position: Sitting, Cuff Size: Normal)   Pulse 87   Ht 5\' 9"  (1.753 m)   Wt 84.8 kg (186 lb 14.4 oz)   BMI 27.60 kg/m   Constitutional:  Alert and oriented, No acute distress.   Laboratory Data:  Urinalysis No results found for: COLORURINE, APPEARANCEUR, LABSPEC, PHURINE, GLUCOSEU, HGBUR, BILIRUBINUR, KETONESUR, PROTEINUR, UROBILINOGEN, NITRITE, LEUKOCYTESUR  Pertinent Imaging: none  Assessment & Plan:  The patient has a low pressure neurogenic bladder. It appears that her flow symptoms are from poor bladder contractility. The role of Flomax physical therapy and a baseline renal ultrasound was discussed. The patient and I both think she  never took Flomax. Prescription given. She will call when things settle down and likely see physical therapy. We will get a baseline ultrasound and I will reassess her in 6 or 7 weeks unfortunately she mentioned that I believe her sister has cancer in her appendix  There are no diagnoses linked to this encounter.  No Follow-up on file.  Reece Packer, MD  [facility] [address] [place], [place] [address] [phone]

## 2017-01-16 DIAGNOSIS — D485 Neoplasm of uncertain behavior of skin: Secondary | ICD-10-CM | POA: Diagnosis not present

## 2017-01-16 DIAGNOSIS — Z85828 Personal history of other malignant neoplasm of skin: Secondary | ICD-10-CM | POA: Diagnosis not present

## 2017-01-16 DIAGNOSIS — B078 Other viral warts: Secondary | ICD-10-CM | POA: Diagnosis not present

## 2017-02-13 DIAGNOSIS — D485 Neoplasm of uncertain behavior of skin: Secondary | ICD-10-CM | POA: Diagnosis not present

## 2017-02-13 DIAGNOSIS — L739 Follicular disorder, unspecified: Secondary | ICD-10-CM | POA: Diagnosis not present

## 2017-02-13 DIAGNOSIS — Z85828 Personal history of other malignant neoplasm of skin: Secondary | ICD-10-CM | POA: Diagnosis not present

## 2017-02-13 DIAGNOSIS — Z1283 Encounter for screening for malignant neoplasm of skin: Secondary | ICD-10-CM | POA: Diagnosis not present

## 2017-02-13 DIAGNOSIS — D229 Melanocytic nevi, unspecified: Secondary | ICD-10-CM | POA: Diagnosis not present

## 2017-02-13 DIAGNOSIS — D2371 Other benign neoplasm of skin of right lower limb, including hip: Secondary | ICD-10-CM | POA: Diagnosis not present

## 2017-02-13 DIAGNOSIS — L820 Inflamed seborrheic keratosis: Secondary | ICD-10-CM | POA: Diagnosis not present

## 2017-02-22 DIAGNOSIS — F411 Generalized anxiety disorder: Secondary | ICD-10-CM | POA: Diagnosis not present

## 2017-02-22 DIAGNOSIS — F330 Major depressive disorder, recurrent, mild: Secondary | ICD-10-CM | POA: Diagnosis not present

## 2017-02-22 DIAGNOSIS — F3281 Premenstrual dysphoric disorder: Secondary | ICD-10-CM | POA: Diagnosis not present

## 2017-02-23 ENCOUNTER — Ambulatory Visit
Admission: RE | Admit: 2017-02-23 | Discharge: 2017-02-23 | Disposition: A | Discharge status: Home / Self Care | Payer: BLUE CROSS/BLUE SHIELD | Source: Ambulatory Visit | Attending: Urology | Admitting: Urology

## 2017-02-23 DIAGNOSIS — N1330 Unspecified hydronephrosis: Secondary | ICD-10-CM | POA: Diagnosis not present

## 2017-02-26 ENCOUNTER — Encounter: Payer: Self-pay | Admitting: Urology

## 2017-02-26 ENCOUNTER — Ambulatory Visit: Payer: BLUE CROSS/BLUE SHIELD | Admitting: Urology

## 2017-02-26 VITALS — BP 150/81 | HR 90 | Ht 69.0 in | Wt 187.0 lb

## 2017-02-26 DIAGNOSIS — R3912 Poor urinary stream: Secondary | ICD-10-CM

## 2017-02-26 NOTE — Progress Notes (Signed)
02/26/2017 9:16 AM   Michelle Ruiz 17-Jul-1970 [id number]  Referring provider: No referring provider defined for this encounter.  Chief Complaint  Patient presents with  . Hydronephrosis    8 wk follow up    HPI: I was consulted to assess the patient's flow symptoms and likely neurogenic bladder. She has had multiple sclerosisfor 5 years. For a number of years she has a poor flow and hesitates. She stops and starts. She has decreased sensation. She will sit and tried her relaxants and double void of varying amount. She took a medication that may have been Flomax and it causes pressure and the inability to urinate  Once the year she gets a bladder infection. She is continent. She voids every 2 or 3 hours and has nocturia 1. The patient thinks her sensation is still present but just a little bit less near the vagina and rectum   The patient has significant flow symptoms in keeping with a diagnosis of neurogenic bladder secondary to multiple sclerosis.   On urodynamics she did not void was catheterized for 300 mL. Bladder capacity was 578 mL. Bladder was unstable reaching a pressure of 7 cm water and she did not leak. She did not leak with a Valsalva pressure of 78 cm of water. She could not voluntarily void. She tried for a long time. She generated contraction of 6 cm of water but it was not well sustained. She does have a bashful bladder. EMG activity was unremarkable. Bladder neck descent it 1 cm. Importantly she voided 550 mL after the study.  The patient has a low pressure neurogenic bladder. It appears that her flow symptoms are from poor bladder contractility. The role of Flomax physical therapy and a baseline renal ultrasound was discussed. The patient and I both think she never took Flomax. Prescription given. She will call when things settle down and likely see physical therapy. We will get a baseline ultrasound and I will reassess her in 6 or 7 weeks unfortunately she mentioned  that I believe her sister has cancer in her appendix   Today U/sound normal and PVR was 32 ML Frequency and flow symptoms are stable.    PMH: Past Medical History:  Diagnosis Date  . Abnormal Pap smear of cervix   . Anxiety   . Kidney stones   . Neuromuscular disorder [name])     Surgical History: Past Surgical History:  Procedure Laterality Date  . BREAST BIOPSY Right 2010   stereotactic biopsy/ neg/ dr.byrnett  . BREAST BIOPSY Left 2012   neg/dr. brynett  . CESAREAN SECTION  2008   [facility]    Home Medications:  Allergies as of 02/26/2017      Reactions   Amoxicillin Other (See Comments)   Other Other (See Comments)   Sulfa Antibiotics    Other reaction(s): Unknown      Medication List        Accurate as of 02/26/17  9:16 AM. Always use your most recent med list.          AMPYRA 10 MG Tb12 Generic drug:  dalfampridine Take 10 mg by mouth 2 (two) times daily.   baclofen 10 MG tablet Commonly known as:  LIORESAL 10 mg 2 (two) times daily.   buPROPion 100 MG tablet Commonly known as:  WELLBUTRIN Take 100 mg by mouth 2 (two) times daily.   cyanocobalamin 2000 MCG tablet Take 2,000 mcg by mouth daily.   PA VITAMIN D-3 2000 units Caps Generic drug:  Cholecalciferol Take 2,000 Units by mouth.   sertraline 50 MG tablet Commonly known as:  ZOLOFT Take 100 mg by mouth.   tamsulosin 0.4 MG Caps capsule Commonly known as:  FLOMAX Take 1 capsule (0.4 mg total) by mouth daily.   TECFIDERA 240 MG Cpdr Generic drug:  Dimethyl Fumarate 240 mg 1 day or 1 dose.       Allergies:  Allergies  Allergen Reactions  . Amoxicillin Other (See Comments)  . Other Other (See Comments)  . Sulfa Antibiotics     Other reaction(s): Unknown    Family History: Family History  Problem Relation Age of Onset  . Endometrial cancer Mother 43  . Multiple sclerosis Sister 39  . Autism Son        Infantile  . Diabetes type II Other        Aunt  . Bladder Cancer Neg Hx   .  Kidney cancer Neg Hx     Social History:  reports that  has never smoked. she has never used smokeless tobacco. She reports that she drinks about 2.4 oz of alcohol per week. She reports that she does not use drugs.  ROS: UROLOGY Frequent Urination?: No Hard to postpone urination?: No Burning/pain with urination?: No Get up at night to urinate?: No Leakage of urine?: No Urine stream starts and stops?: No Trouble starting stream?: No Do you have to strain to urinate?: No Blood in urine?: No Urinary tract infection?: No Sexually transmitted disease?: No Injury to kidneys or bladder?: No Painful intercourse?: No Weak stream?: No Currently pregnant?: No Vaginal bleeding?: No Last menstrual period?: n  Gastrointestinal Nausea?: No Vomiting?: No Indigestion/heartburn?: No Diarrhea?: No Constipation?: No  Constitutional Fever: No Night sweats?: No Weight loss?: No Fatigue?: No  Skin Skin rash/lesions?: No Itching?: No  Eyes Blurred vision?: No Double vision?: No  Ears/Nose/Throat Sore throat?: No Sinus problems?: No  Hematologic/Lymphatic Swollen glands?: No Easy bruising?: No  Cardiovascular Leg swelling?: No Chest pain?: No  Respiratory Cough?: No Shortness of breath?: No  Endocrine Excessive thirst?: No  Musculoskeletal Back pain?: No Joint pain?: No  Neurological Headaches?: No Dizziness?: No  Psychologic Depression?: No Anxiety?: No  Physical Exam: BP (!) 150/81   Pulse 90   Ht 5\' 9"  (1.753 m)   Wt 187 lb (84.8 kg)   LMP 02/03/2017 (Approximate)   BMI 27.62 kg/m   Constitutional:  Alert and oriented, No acute distress.  Laboratory Data:  Urinalysis No results found for: COLORURINE, APPEARANCEUR, LABSPEC, PHURINE, GLUCOSEU, HGBUR, BILIRUBINUR, KETONESUR, PROTEINUR, UROBILINOGEN, NITRITE, LEUKOCYTESUR  Pertinent Imaging: As above  Assessment & Plan: The patient does not want to take the alpha blocker or see physical therapy at  this stage.  If she does go on a pill I will see her once a year.  Otherwise I will see her as needed.  She was pleased with the reassurance  There are no diagnoses linked to this encounter.  No Follow-up on file.  Reece Packer, MD  [facility] [address], [place], [place] [address] [phone]

## 2017-03-29 DIAGNOSIS — R635 Abnormal weight gain: Secondary | ICD-10-CM | POA: Diagnosis not present

## 2017-03-29 DIAGNOSIS — G35 Multiple sclerosis: Secondary | ICD-10-CM | POA: Diagnosis not present

## 2017-03-29 DIAGNOSIS — Z79899 Other long term (current) drug therapy: Secondary | ICD-10-CM | POA: Diagnosis not present

## 2017-04-06 DIAGNOSIS — L570 Actinic keratosis: Secondary | ICD-10-CM | POA: Diagnosis not present

## 2017-04-18 ENCOUNTER — Encounter: Payer: BLUE CROSS/BLUE SHIELD | Attending: Neurology | Admitting: Dietician

## 2017-04-18 ENCOUNTER — Encounter: Payer: Self-pay | Admitting: Dietician

## 2017-04-18 VITALS — Ht 69.0 in | Wt 193.5 lb

## 2017-04-18 DIAGNOSIS — R739 Hyperglycemia, unspecified: Secondary | ICD-10-CM | POA: Diagnosis not present

## 2017-04-18 DIAGNOSIS — R635 Abnormal weight gain: Secondary | ICD-10-CM

## 2017-04-18 NOTE — Patient Instructions (Signed)
   Decrease ice cream intake at night, look for low-cal alternatives such as lowfat fudge bar, frozen fruit and vanilla yogurt, high fiber cereal, etc.   Use less creamy/ cheesy/ buttery sauces and toppings on foods -- smaller portions or lower fat alternatives.   Limit or avoid fast foods. Choose grilled chicken, salads, soups instead of burgers and fries when possible.   Track food intake using phone app -- Fitbit, MyFitnessPal, LoseIt, and Calorie [employer] are all options that offer free tracking.

## 2017-04-18 NOTE — Progress Notes (Signed)
Medical Nutrition Therapy: Visit start time: 1030  end time: 1130  Assessment:  Diagnosis: weight gain; patient reports pre-diabetes Past medical history: MS Psychosocial issues/ stress concerns: none; some history of anxiety and depression Preferred learning method:  . Visual  Current weight: 193.5lbs with shoes  Height: 5'9" Medications, supplements: reconciled list in medical record  Progress and evaluation: Patient reports weight gain of about 30lbs in past 2 years. She has stopped drinking 2 glasses of wine nightly as of 1 year ago, but replaced it with 1pt. ice cream nightly. Otherwise no significant diet or lifesltye changes. No special dieting at this time; met with RD several years ago, did weight watchers 10 years ago. Goal weight is 160lbs. Husband does grocery shopping and meal prep.    Physical activity: walking 30 minutes daily, bike 30 minutes 3 times a week + arm exercise with 5lb free weights.  Dietary Intake:  Usual eating pattern includes 3 meals and 1-2 snacks per day. Dining out frequency: 3 meals per week.  Breakfast: 10am [place] light n fit yogurt with granola or [employer] protein bar Snack: none Lunch: 12 [place] sandwich with garlic aioli, lettuce, and chips or pretzels Snack: occasional protein bar about 4pm Supper: husband cooks: salmon with couscous and asparagus; meat less red meats veg, starch; grilled chicken sandwich, etc. Sometimes with creamy sauces, butter. Snack: 1 pint Ben and Jerry's ice cream (craves it) Beverages: water, occasional unsweetened tea (no sweetener), 2-3c. coffee with Stevia, creamer. No sodas, wine.   Nutrition Care Education: Topics covered: weight control.  Basic nutrition: basic food groups, appropriate nutrient balance, appropriate meal and snack schedule, general nutrition guidelines    Weight control: determining reasonable weight loss rate, determined energy needs for weight loss at about 1600kcal daily per patient request, and  instructed on needs for carbohydrates and healthy carb choices; importance of low fat and low sugar foods, and increasing fiber to promote satiety; discussed healthy options for snacks; portion control strategies; benefits of tracking food intake and regular weighing.  Hyperglycemia: appropriate meal and snack schedule, appropriate carb intake and balance and healthy carb choices, strategies for limiting sugar intake.  Nutritional Diagnosis:  [place]-3.4 Unintentional weight gain As related to increase in caloric intake.  As evidenced by gain of 30 lbs in past 2 years per patient report.  Intervention: Instruction as noted above.   Patient is now making many healthy choices; will work on some sources of extra calories.   Set goals with input from patient.     Education Materials given:  . General diet guidelines for Diabetes . Food lists/ Planning A Balanced Meal . Snacking handout . Goals/ instructions   Learner/ who was taught:  . Patient   Level of understanding: Marland Kitchen Verbalizes/ demonstrates competency  Demonstrated understanding of instruction by: teach back Learning barriers: . None  Willingness to learn/ readiness for change: . Eager, change in progress  Monitoring and Evaluation:  Dietary intake, exercise, and body weight      follow up: 05/16/17

## 2017-05-01 DIAGNOSIS — J069 Acute upper respiratory infection, unspecified: Secondary | ICD-10-CM | POA: Diagnosis not present

## 2017-05-10 DIAGNOSIS — F3281 Premenstrual dysphoric disorder: Secondary | ICD-10-CM | POA: Diagnosis not present

## 2017-05-10 DIAGNOSIS — F411 Generalized anxiety disorder: Secondary | ICD-10-CM | POA: Diagnosis not present

## 2017-05-10 DIAGNOSIS — F330 Major depressive disorder, recurrent, mild: Secondary | ICD-10-CM | POA: Diagnosis not present

## 2017-05-16 ENCOUNTER — Encounter: Payer: Self-pay | Admitting: Dietician

## 2017-05-16 ENCOUNTER — Encounter: Payer: BLUE CROSS/BLUE SHIELD | Attending: Neurology | Admitting: Dietician

## 2017-05-16 VITALS — Ht 69.0 in | Wt 189.3 lb

## 2017-05-16 DIAGNOSIS — R635 Abnormal weight gain: Secondary | ICD-10-CM | POA: Diagnosis not present

## 2017-05-16 NOTE — Progress Notes (Signed)
Medical Nutrition Therapy: Visit start time: 8115  end time: 1145  Assessment:  Diagnosis: abnormal weight gain Medical history changes: no changes Psychosocial issues/ stress concerns: none  Current weight: 189.3lbs  Height: 5'9" Medications, supplement changes: no changes per patient  Progress and evaluation: Weight has decreased by 4.2lbs since 04/18/17. Patient states she has stopped Ben and Jerry's ice cream at night, switched to [employer] bars instead, is now portioning snacks; logs food intake almost daily, measures food portions; making lower-cal choices when eating out, no fast food unless salad.    Physical activity: walking 30 minutes daily, bike 30 minutes 3 times a week + arm exercise 5lb free weights.   Dietary Intake:  Usual eating pattern includes 3 meals and 2 snacks per day. Dining out frequency: not assessed today.  Breafast: [place] yogurt or protein bar Snack: none Lunch: [place] sandwich with mustard, provolone cheese, and mini pretzels (portioned) Snack: protein bar, or 1/4cup peanuts and string cheese Supper: tilapia with couscours and asparagus, grilled chicken sandwich with broccoli Snack: [place] bar 100-130kcal Beverages: water, some unsweetened tea, coffee with creamer and Stevia  Nutrition Care Education: Topics covered: weight control Weight control: reviewed progress since previous visit; typical weight loss patterns and potential for weight loss plateaus, making lifestyle changes into permanent habits.  Nutritional Diagnosis:  [place]-3.3 Overweight/obesity As related to history of excess calories.  As evidenced by BMI of 27.95, patient report.  Intervention: Discussion as noted above.   Commended patient for changes made.    No new goals at this time, patient will continue with current eating and exercise patterns.  Education Materials given:  Marland Kitchen Goals/ instructions  Learner/ who was taught:  . Patient   Level of understanding: Marland Kitchen Verbalizes/  demonstrates competency   Demonstrated degree of understanding via:   Teach back Learning barriers: . None  Willingness to learn/ readiness for change: . Eager, change in progress  Monitoring and Evaluation:  Dietary intake, exercise, and body weight      follow up: 06/27/17

## 2017-05-16 NOTE — Patient Instructions (Signed)
   Continue with current healthy food choices and portion control, great job!

## 2017-05-18 DIAGNOSIS — L570 Actinic keratosis: Secondary | ICD-10-CM | POA: Diagnosis not present

## 2017-06-12 ENCOUNTER — Ambulatory Visit: Payer: BLUE CROSS/BLUE SHIELD | Admitting: Obstetrics and Gynecology

## 2017-06-12 ENCOUNTER — Encounter: Payer: Self-pay | Admitting: Obstetrics and Gynecology

## 2017-06-12 VITALS — BP 124/80 | HR 75 | Ht 69.0 in | Wt 183.0 lb

## 2017-06-12 DIAGNOSIS — N3001 Acute cystitis with hematuria: Secondary | ICD-10-CM | POA: Diagnosis not present

## 2017-06-12 LAB — POCT URINALYSIS DIPSTICK
Bilirubin, UA: NEGATIVE
Glucose, UA: NEGATIVE
Ketones, UA: NEGATIVE
Nitrite, UA: POSITIVE
Protein, UA: NEGATIVE
Spec Grav, UA: 1.01 (ref 1.010–1.025)
pH, UA: 7.5 (ref 5.0–8.0)

## 2017-06-12 MED ORDER — CIPROFLOXACIN HCL 500 MG PO TABS: 500.0000 mg | ORAL_TABLET | Freq: Two times a day (BID) | ORAL | 0 refills | Status: DC

## 2017-06-12 NOTE — Patient Instructions (Signed)
I value your feedback and entrusting us with your care. If you get a [place] patient survey, I would appreciate you taking the time to let us know about your experience today. Thank you! 

## 2017-06-12 NOTE — Progress Notes (Signed)
Patient, No Pcp Per   Chief Complaint  Patient presents with  . Urinary Tract Infection    HPI:      [name] is a 47 y.o. G2P1011 who LMP was Patient's last menstrual period was 06/05/2017., presents today for UTI sx of urinary frequency/urgency/dysuria, urine odor, and suprapubic pressure. No hematuria/LBP/fevers, no vag sx, no VB. Hx of UTIs in past, last one 2017. Macrobid didn't work in past even though sensitivities showed susceptibility. Pt does better with cipro.   Past Medical History:  Diagnosis Date  . Abnormal Pap smear of cervix   . Anxiety   . Kidney stones   . Neuromuscular disorder [facility])     Past Surgical History:  Procedure Laterality Date  . BREAST BIOPSY Right 2010   stereotactic biopsy/ neg/ dr.byrnett  . BREAST BIOPSY Left 2012   neg/dr. brynett  . CESAREAN SECTION  2008   [facility]    Family History  Problem Relation Age of Onset  . Endometrial cancer Mother 22  . Multiple sclerosis Sister 83  . Autism Son        Infantile  . Diabetes type II Other        Aunt  . Bladder Cancer Neg Hx   . Kidney cancer Neg Hx     Social History   Socioeconomic History  . Marital status: Married    Spouse name: Not on file  . Number of children: Not on file  . Years of education: Not on file  . Highest education level: Not on file  Social Needs  . Financial resource strain: Not on file  . Food insecurity - worry: Not on file  . Food insecurity - inability: Not on file  . Transportation needs - medical: Not on file  . Transportation needs - non-medical: Not on file  Occupational History  . Not on file  Tobacco Use  . Smoking status: Never Smoker  . Smokeless tobacco: Never Used  Substance and Sexual Activity  . Alcohol use: Yes    Alcohol/week: 2.4 oz    Types: 4 Glasses of wine per week  . Drug use: No  . Sexual activity: Yes    Birth control/protection: Surgical    Comment: Vasectomy  Other Topics Concern  . Not on file  Social  History Narrative  . Not on file    Outpatient Medications Prior to Visit  Medication Sig Dispense Refill  . baclofen (LIORESAL) 10 MG tablet 10 mg 2 (two) times daily.    Marland Kitchen buPROPion (WELLBUTRIN SR) 100 MG 12 hr tablet TAKE 1 TABLET BY MOUTH TWICE DAILY AT 8AM AND 3PM  1  . Cholecalciferol (PA VITAMIN D-3) 2000 UNITS CAPS Take 2,000 Units by mouth.    . clonazePAM (KLONOPIN) 0.5 MG tablet TAKE 1 TABLET BY MOUTH EVERY DAY AS NEEDED **STOP XANAX**  3  . dalfampridine (AMPYRA) 10 MG TB12 Take 10 mg by mouth 2 (two) times daily.    . Dimethyl Fumarate (TECFIDERA) 240 MG CPDR 240 mg 1 day or 1 dose.    . sertraline (ZOLOFT) 50 MG tablet Take 100 mg by mouth.    . cyanocobalamin 2000 MCG tablet Take 2,000 mcg by mouth daily.     No facility-administered medications prior to visit.     ROS:  Review of Systems  Constitutional: Negative for fever.  Gastrointestinal: Negative for blood in stool, constipation, diarrhea, nausea and vomiting.  Genitourinary: Positive for dysuria, frequency and urgency. Negative for  dyspareunia, flank pain, hematuria, vaginal bleeding, vaginal discharge and vaginal pain.  Musculoskeletal: Negative for back pain.  Skin: Negative for rash.    OBJECTIVE:   Vitals:  BP 124/80   Pulse 75   Ht 5\' 9"  (1.753 m)   Wt 183 lb (83 kg)   LMP 06/05/2017   BMI 27.02 kg/m   Physical Exam  Constitutional: She is oriented to person, place, and time and well-developed, well-nourished, and in no distress.  Abdominal: There is no CVA tenderness.  Neurological: She is alert and oriented to person, place, and time.  Psychiatric: Memory, affect and judgment normal.  Vitals reviewed.   Results: Results for orders placed or performed in visit on 06/12/17 (from the past 24 hour(s))  POCT Urinalysis Dipstick     Status: Abnormal   Collection Time: 06/12/17  4:15 PM  Result Value Ref Range   Color, UA yellow    Clarity, UA cloudy    Glucose, UA neg    Bilirubin, UA neg     Ketones, UA neg    Spec Grav, UA 1.010 1.010 - 1.025   Blood, UA moderate    pH, UA 7.5 5.0 - 8.0   Protein, UA neg    Urobilinogen, UA  0.2 or 1.0 E.U./dL   Nitrite, UA pos    Leukocytes, UA Trace (A) Negative   Appearance     Odor yes     Assessment/Plan: Acute cystitis with hematuria - Pos dip. Rx cipro (macrobid didn't help in past even though bacteria susc). Check C&S. F/u prn.  - Plan: POCT Urinalysis Dipstick, Urine Culture, ciprofloxacin (CIPRO) 500 MG tablet    Meds ordered this encounter  Medications  . ciprofloxacin (CIPRO) 500 MG tablet    Sig: Take 1 tablet (500 mg total) by mouth 2 (two) times daily.    Dispense:  6 tablet    Refill:  0    Order Specific Question:   Supervising Provider    Answer:   Gae Dry [[id number]]   Due for annual.    Return if symptoms worsen or fail to improve.  Alicia B. Copland, PA-C 06/12/2017 4:17 PM

## 2017-06-14 LAB — URINE CULTURE

## 2017-06-27 ENCOUNTER — Encounter: Payer: Self-pay | Admitting: Obstetrics and Gynecology

## 2017-06-27 ENCOUNTER — Ambulatory Visit: Payer: BLUE CROSS/BLUE SHIELD | Admitting: Dietician

## 2017-06-27 ENCOUNTER — Ambulatory Visit: Payer: BLUE CROSS/BLUE SHIELD | Admitting: Obstetrics and Gynecology

## 2017-06-27 VITALS — BP 140/80 | HR 84 | Ht 69.0 in | Wt 187.0 lb

## 2017-06-27 DIAGNOSIS — N3001 Acute cystitis with hematuria: Secondary | ICD-10-CM

## 2017-06-27 LAB — POCT URINALYSIS DIPSTICK
Bilirubin, UA: NEGATIVE
Glucose, UA: NEGATIVE
Ketones, UA: NEGATIVE
Nitrite, UA: NEGATIVE
Protein, UA: NEGATIVE
Spec Grav, UA: 1.01 (ref 1.010–1.025)
pH, UA: 7 (ref 5.0–8.0)

## 2017-06-27 MED ORDER — CIPROFLOXACIN HCL 500 MG PO TABS: 500.0000 mg | ORAL_TABLET | Freq: Two times a day (BID) | ORAL | 0 refills | Status: DC

## 2017-06-27 NOTE — Progress Notes (Signed)
Patient, No Pcp Per   Chief Complaint  Patient presents with  . Urinary Tract Infection    feels like it's coming back;  woke up in middle of night c a lot of pressure    HPI:      [name] is a 47 y.o. G2P1011 who LMP was Patient's last menstrual period was 06/05/2017., presents today for UTI sx again. Pt with urinary frequency, pressure, dysuria, chills since this morning. No LBP, vag sx, vag bleeding. Drinks lots of water. Drinks caffeine. Hx of frequent UTIs due to bladder relaxation and spasms with MS. Has had full eval with urology in past. Treated for UTI 06/12/17 with cipro for 3 days. C&S showed proteus mirabilis, cipro susc, 06/12/17.  Past Medical History:  Diagnosis Date  . Abnormal Pap smear of cervix   . Anxiety   . Kidney stones   . MS (multiple sclerosis) ([place])   . UTI (urinary tract infection)     Past Surgical History:  Procedure Laterality Date  . BREAST BIOPSY Right 2010   stereotactic biopsy/ neg/ dr.byrnett  . BREAST BIOPSY Left 2012   neg/dr. brynett  . C[facility]AREAN SECTION  2008   [facility]    Family History  Problem Relation Age of Onset  . Endometrial cancer Mother 70  . Multiple sclerosis Sister 41  . Autism Son        Infantile  . Diabetes type II Other        Aunt  . Bladder Cancer Neg Hx   . Kidney cancer Neg Hx     Social History   Socioeconomic History  . Marital status: Married    Spouse name: Not on file  . Number of children: Not on file  . Years of education: Not on file  . Highest education level: Not on file  Occupational History  . Not on file  Social Needs  . Financial resource strain: Not on file  . Food insecurity:    Worry: Not on file    Inability: Not on file  . Transportation needs:    Medical: Not on file    Non-medical: Not on file  Tobacco Use  . Smoking status: Never Smoker  . Smokeless tobacco: Never Used  Substance and Sexual Activity  . Alcohol use: Not Currently    Alcohol/week: 2.4 oz   Types: 4 Glasses of wine per week  . Drug use: No  . Sexual activity: Yes    Birth control/protection: Surgical    Comment: Vasectomy  Lifestyle  . Physical activity:    Days per week: Not on file    Minutes per session: Not on file  . Stress: Not on file  Relationships  . Social connections:    Talks on phone: Not on file    Gets together: Not on file    Attends religious service: Not on file    Active member of club or organization: Not on file    Attends meetings of clubs or organizations: Not on file    Relationship status: Not on file  . Intimate partner violence:    Fear of current or ex partner: Not on file    Emotionally abused: Not on file    Physically abused: Not on file    Forced sexual activity: Not on file  Other Topics Concern  . Not on file  Social History Narrative  . Not on file    Outpatient Medications Prior to Visit  Medication Sig  Dispense Refill  . buPROPion (WELLBUTRIN SR) 100 MG 12 hr tablet TAKE 1 TABLET BY MOUTH TWICE DAILY AT 8AM AND 3PM  1  . Cholecalciferol (PA VITAMIN D-3) 2000 UNITS CAPS Take 2,000 Units by mouth.    . clonazePAM (KLONOPIN) 0.5 MG tablet TAKE 1 TABLET BY MOUTH EVERY DAY AS NEEDED **STOP XANAX**  3  . cyanocobalamin 2000 MCG tablet Take 2,000 mcg by mouth daily.    Marland Kitchen dalfampridine (AMPYRA) 10 MG TB12 Take 10 mg by mouth 2 (two) times daily.    . Dimethyl Fumarate (TECFIDERA) 240 MG CPDR 240 mg 1 day or 1 dose.    . sertraline (ZOLOFT) 50 MG tablet Take 100 mg by mouth.    . baclofen (LIORESAL) 10 MG tablet 10 mg 2 (two) times daily.    . ciprofloxacin (CIPRO) 500 MG tablet Take 1 tablet (500 mg total) by mouth 2 (two) times daily. (Patient not taking: Reported on 06/27/2017) 6 tablet 0   No facility-administered medications prior to visit.       ROS:  Review of Systems  Constitutional: Positive for chills. Negative for fever.  Gastrointestinal: Negative for blood in stool, constipation, diarrhea, nausea and vomiting.    Genitourinary: Positive for dysuria and frequency. Negative for dyspareunia, flank pain, hematuria, urgency, vaginal bleeding, vaginal discharge and vaginal pain.  Musculoskeletal: Negative for back pain.  Skin: Negative for rash.   BREAST: No symptoms   OBJECTIVE:   Vitals:  BP 140/80   Pulse 84   Ht 5\' 9"  (1.753 m)   Wt 187 lb (84.8 kg)   LMP 06/05/2017   BMI 27.62 kg/m   Physical Exam  Constitutional: She is oriented to person, place, and time.  Abdominal: There is no CVA tenderness.  Genitourinary: Vagina normal and uterus normal. There is no rash, tenderness, lesion or injury on the right labia. There is no rash, tenderness, lesion or injury on the left labia. Uterus is not enlarged and not tender. Cervix exhibits no motion tenderness and no discharge. Right adnexum displays no mass and no tenderness. Left adnexum displays no mass and no tenderness. No erythema or bleeding in the vagina. No vaginal discharge found.  Neurological: She is alert and oriented to person, place, and time.  Psychiatric: Judgment normal.  Vitals reviewed.   Results: Results for orders placed or performed in visit on 06/27/17 (from the past 24 hour(s))  POCT Urinalysis Dipstick     Status: Abnormal   Collection Time: 06/27/17  4:59 PM  Result Value Ref Range   Color, UA yellow    Clarity, UA clear    Glucose, UA neg    Bilirubin, UA neg    Ketones, UA neg    Spec Grav, UA 1.010 1.010 - 1.025   Blood, UA small    pH, UA 7.0 5.0 - 8.0   Protein, UA neg    Urobilinogen, UA  0.2 or 1.0 E.U./dL   Nitrite, UA neg    Leukocytes, UA Trace (A) Negative   Appearance     Odor       Assessment/Plan: Acute cystitis with hematuria - Pos dip. Rx cipro. Check C&S. If susc to cipro, will add additional wk of tx. D/C caffeine/add Vit C/void frequently. Has seen uro for sx, but has MS. - Plan: POCT Urinalysis Dipstick, Urine Culture, ciprofloxacin (CIPRO) 500 MG tablet    Meds ordered this encounter   Medications  . ciprofloxacin (CIPRO) 500 MG tablet    Sig: Take 1  tablet (500 mg total) by mouth 2 (two) times daily for 7 days.    Dispense:  14 tablet    Refill:  0    Order Specific Question:   Supervising Provider    Answer:   Gae Dry [[id number]]      Return if symptoms worsen or fail to improve.  Alicia B. Copland, PA-C 06/27/2017 5:00 PM

## 2017-06-27 NOTE — Patient Instructions (Signed)
I value your feedback and entrusting us with your care. If you get a [place] patient survey, I would appreciate you taking the time to let us know about your experience today. Thank you! 

## 2017-06-28 DIAGNOSIS — N3001 Acute cystitis with hematuria: Secondary | ICD-10-CM | POA: Diagnosis not present

## 2017-07-02 ENCOUNTER — Telehealth: Payer: Self-pay | Admitting: Obstetrics and Gynecology

## 2017-07-02 DIAGNOSIS — N3001 Acute cystitis with hematuria: Secondary | ICD-10-CM

## 2017-07-02 LAB — URINE CULTURE

## 2017-07-02 MED ORDER — CIPROFLOXACIN HCL 500 MG PO TABS: 500.0000 mg | ORAL_TABLET | Freq: Two times a day (BID) | ORAL | 0 refills | Status: AC

## 2017-07-02 NOTE — Telephone Encounter (Signed)
Pt aware of C&S results. Same bacteria as 3/19. On cipro with sx improvement. Will treat for an addl wk (2 wks total) due to sx hx. F/u prn.

## 2017-07-05 ENCOUNTER — Encounter: Payer: Self-pay | Admitting: Obstetrics and Gynecology

## 2017-07-05 ENCOUNTER — Ambulatory Visit (INDEPENDENT_AMBULATORY_CARE_PROVIDER_SITE_OTHER): Payer: BLUE CROSS/BLUE SHIELD | Admitting: Obstetrics and Gynecology

## 2017-07-05 VITALS — BP 108/70 | HR 79 | Ht 69.0 in | Wt 187.0 lb

## 2017-07-05 DIAGNOSIS — Z01419 Encounter for gynecological examination (general) (routine) without abnormal findings: Secondary | ICD-10-CM

## 2017-07-05 DIAGNOSIS — Z1231 Encounter for screening mammogram for malignant neoplasm of breast: Secondary | ICD-10-CM | POA: Diagnosis not present

## 2017-07-05 DIAGNOSIS — Z1239 Encounter for other screening for malignant neoplasm of breast: Secondary | ICD-10-CM

## 2017-07-05 NOTE — Patient Instructions (Signed)
I value your feedback and entrusting us with your care. If you get a  patient survey, I would appreciate you taking the time to let us know about your experience today. Thank you! 

## 2017-07-05 NOTE — Progress Notes (Signed)
Chief Complaint  Patient presents with  . Gynecologic Exam     HPI:      [name] is a 47 y.o. G2P1011 who LMP was Patient's last menstrual period was 05/31/2017 (exact date)., presents today for her annual examination. Her menses are regular every 25 days, lasting 4 days. Dysmenorrhea none. She does not have intermenstrual bleeding.  Sex activity: single partner, contraception - vasectomy.  Last Pap: April 15, 2015  Results were: no abnormalities /neg HPV DNA  Hx of STDs: none  Last mammogram: 07/18/16  Results were: normal--routine follow-up in 12 months There is no FH of breast cancer. There is no FH of ovarian cancer. The patient does not do self-breast exams.  Tobacco use: The patient denies current or previous tobacco use. Alcohol use: none Exercise: moderately active  She does get adequate calcium and Vitamin D in her diet.  Her UTI sx from 3/19 and 4/19 have resolved with cipro. Taking cipro for 2 wks this time due to recurrent sx. Hx of MS with bladder laxity.   Past Medical History:  Diagnosis Date  . Abnormal Pap smear of cervix   . Anxiety   . Kidney stones   . MS (multiple sclerosis) ([place])   . UTI (urinary tract infection)     Past Surgical History:  Procedure Laterality Date  . BREAST BIOPSY Right 2010   stereotactic biopsy/ neg/ dr.byrnett  . BREAST BIOPSY Left 2012   neg/dr. brynett  . CESAREAN SECTION  2008   [facility]    Family History  Problem Relation Age of Onset  . Endometrial cancer Mother 77  . Multiple sclerosis Sister 35  . Cancer Sister 21       appendix  . Autism Son        Infantile  . Diabetes type II Other        Aunt  . Bladder Cancer Neg Hx   . Kidney cancer Neg Hx     Social History   Socioeconomic History  . Marital status: Married    Spouse name: Not on file  . Number of children: Not on file  . Years of education: Not on file  . Highest education level: Not on file  Occupational History  . Not on  file  Social Needs  . Financial resource strain: Not on file  . Food insecurity:    Worry: Not on file    Inability: Not on file  . Transportation needs:    Medical: Not on file    Non-medical: Not on file  Tobacco Use  . Smoking status: Never Smoker  . Smokeless tobacco: Never Used  Substance and Sexual Activity  . Alcohol use: Not Currently    Alcohol/week: 2.4 oz    Types: 4 Glasses of wine per week  . Drug use: No  . Sexual activity: Yes    Birth control/protection: Surgical    Comment: Vasectomy  Lifestyle  . Physical activity:    Days per week: Not on file    Minutes per session: Not on file  . Stress: Not on file  Relationships  . Social connections:    Talks on phone: Not on file    Gets together: Not on file    Attends religious service: Not on file    Active member of club or organization: Not on file    Attends meetings of clubs or organizations: Not on file    Relationship status: Not on file  .  Intimate partner violence:    Fear of current or ex partner: Not on file    Emotionally abused: Not on file    Physically abused: Not on file    Forced sexual activity: Not on file  Other Topics Concern  . Not on file  Social History Narrative  . Not on file    Current Outpatient Medications on File Prior to Visit  Medication Sig Dispense Refill  . baclofen (LIORESAL) 10 MG tablet 10 mg 2 (two) times daily.    Marland Kitchen buPROPion (WELLBUTRIN SR) 100 MG 12 hr tablet TAKE 1 TABLET BY MOUTH TWICE DAILY AT 8AM AND 3PM  1  . Cholecalciferol (PA VITAMIN D-3) 2000 UNITS CAPS Take 2,000 Units by mouth.    . ciprofloxacin (CIPRO) 500 MG tablet Take 1 tablet (500 mg total) by mouth 2 (two) times daily for 7 days. 14 tablet 0  . clonazePAM (KLONOPIN) 0.5 MG tablet TAKE 1 TABLET BY MOUTH EVERY DAY AS NEEDED **STOP XANAX**  3  . cyanocobalamin 2000 MCG tablet Take 2,000 mcg by mouth daily.    Marland Kitchen dalfampridine (AMPYRA) 10 MG TB12 Take 10 mg by mouth 2 (two) times daily.    . Dimethyl  Fumarate (TECFIDERA) 240 MG CPDR 240 mg 1 day or 1 dose.    . sertraline (ZOLOFT) 50 MG tablet Take 100 mg by mouth.     No current facility-administered medications on file prior to visit.       ROS:  Review of Systems  Constitutional: Negative for fatigue, fever and unexpected weight change.  Respiratory: Negative for cough, shortness of breath and wheezing.   Cardiovascular: Negative for chest pain, palpitations and leg swelling.  Gastrointestinal: Negative for blood in stool, constipation, diarrhea, nausea and vomiting.  Endocrine: Negative for cold intolerance, heat intolerance and polyuria.  Genitourinary: Negative for dyspareunia, dysuria, flank pain, frequency, genital sores, hematuria, menstrual problem, pelvic pain, urgency, vaginal bleeding, vaginal discharge and vaginal pain.  Musculoskeletal: Negative for back pain, joint swelling and myalgias.  Skin: Negative for rash.  Neurological: Negative for dizziness, syncope, light-headedness, numbness and headaches.  Hematological: Negative for adenopathy.  Psychiatric/Behavioral: Negative for agitation, confusion, sleep disturbance and suicidal ideas. The patient is not nervous/anxious.      Objective: BP 108/70   Pulse 79   Ht 5\' 9"  (1.753 m)   Wt 187 lb (84.8 kg)   LMP 05/31/2017 (Exact Date)   BMI 27.62 kg/m    Physical Exam  Constitutional: She is oriented to person, place, and time. She appears well-developed and well-nourished.  Genitourinary: Vagina normal and uterus normal. There is no rash or tenderness on the right labia. There is no rash or tenderness on the left labia. No erythema or tenderness in the vagina. No vaginal discharge found. Right adnexum does not display mass and does not display tenderness. Left adnexum does not display mass and does not display tenderness. Cervix does not exhibit motion tenderness or polyp. Uterus is not enlarged or tender.  Neck: Normal range of motion. No thyromegaly present.    Cardiovascular: Normal rate, regular rhythm and normal heart sounds.  No murmur heard. Pulmonary/Chest: Effort normal and breath sounds normal. Right breast exhibits no mass, no nipple discharge, no skin change and no tenderness. Left breast exhibits no mass, no nipple discharge, no skin change and no tenderness.  Abdominal: Soft. There is no tenderness. There is no guarding.  Musculoskeletal: Normal range of motion.  Neurological: She is alert and oriented to person, place,  and time. No cranial nerve deficit.  Psychiatric: She has a normal mood and affect. Her behavior is normal.  Vitals reviewed.  Assessment/Plan: Encounter for annual routine gynecological examination  Screening for breast cancer - Pt to sched mammo - Plan: MM DIGITAL SCREENING BILATERAL     GYN counsel breast self exam, mammography screening, adequate intake of calcium and vitamin D, diet and exercise     F/U  Return in about 1 year (around 07/06/2018).  Freyja Govea B. Maijor Hornig, PA-C 07/05/2017 9:32 AM

## 2017-07-09 ENCOUNTER — Encounter: Payer: Self-pay | Admitting: Dietician

## 2017-07-09 ENCOUNTER — Encounter: Payer: BLUE CROSS/BLUE SHIELD | Attending: Neurology | Admitting: Dietician

## 2017-07-09 VITALS — Ht 69.0 in | Wt 185.8 lb

## 2017-07-09 DIAGNOSIS — R635 Abnormal weight gain: Secondary | ICD-10-CM

## 2017-07-09 NOTE — Progress Notes (Signed)
Medical Nutrition Therapy: Visit start time: 1330  end time: 1410  Assessment:  Diagnosis: abnormal weight gain Medical history changes: no changes per patient Psychosocial issues/ stress concerns: none  Current weight: 185.8lbs Height: 5'9" Medications, supplement changes: reconciled list in medical record  Progress and evaluation: Patient reports less food tracking and attention to food portions in the past month. She has continued with lower calorie snacks and with regular physical activity. She has been eating multiple lifesavers candies during the day to help herself wean off nicotine gum.    Physical activity: bike, walking, arm weights 3x a week  Dietary Intake:  Usual eating pattern includes 3 meals and 1 snacks per day. Dining out frequency: not assessed today.  Breakfast: [place] yogurt of protein bar Snack: lifesavers Lunch: [place] and provolone sandwich with sun chips or pretaels Snack: lifesavers orange mint throughout the day Supper: salmon, tuna with rice and asparagus or green beans; crock pot pork [place] style with tortilla chips scoops and cheese dip Snack: [place] bar Beverages: water, unsweetened tea, 2c coffee daily  Nutrition Care Education: Topics covered: weight control    Weight control: appropriate food portions and behavioral strategies for controlling, such as measuring, hand comparisons, avoiding eating from large containers; options for tracking food intake Other:  Recommended limit for added sugars and plan for limiting sugar from lifesavers.   Nutritional Diagnosis:  [place]-3.3 Overweight/obesity As related to history of excess calories and inactivity.  As evidenced by patient with BMI of 27.4, improving with gradual weight loss with diet changes.  Intervention: Discussion as noted above.   Updated goals with direction from patient.   Education Materials given:  Marland Kitchen Goals/ instructions   Learner/ who was taught:  . Patient   Level of  understanding: Marland Kitchen Verbalizes/ demonstrates competency   Demonstrated degree of understanding via:   Teach back Learning barriers: . None  Willingness to learn/ readiness for change: . Eager, change in progress  Monitoring and Evaluation:  Dietary intake, exercise, and body weight      follow up: 09/17/17

## 2017-07-09 NOTE — Patient Instructions (Signed)
   Resume logging food intake, OK to try paper instead of phone app.   Work on limiting the number of [employer] each day. Ideal goal would be 8 or less daily to keep added sugar to a heart healthy, anti-inflammatory level.  Measure some food portions, especially starches, and added sauces/gravies/dips. Goal for starches is 1 cup, or fist-size or less. Goal for sauces is likely 2-4 Tbsp.

## 2017-08-03 ENCOUNTER — Ambulatory Visit
Admission: RE | Admit: 2017-08-03 | Discharge: 2017-08-03 | Disposition: A | Discharge status: Home / Self Care | Payer: BLUE CROSS/BLUE SHIELD | Source: Ambulatory Visit | Attending: Obstetrics and Gynecology | Admitting: Obstetrics and Gynecology

## 2017-08-03 DIAGNOSIS — Z1231 Encounter for screening mammogram for malignant neoplasm of breast: Secondary | ICD-10-CM | POA: Insufficient documentation

## 2017-08-03 DIAGNOSIS — Z1239 Encounter for other screening for malignant neoplasm of breast: Secondary | ICD-10-CM

## 2017-08-06 ENCOUNTER — Encounter: Payer: Self-pay | Admitting: Obstetrics and Gynecology

## 2017-08-14 DIAGNOSIS — D1800 Hemangioma unspecified site: Secondary | ICD-10-CM | POA: Diagnosis not present

## 2017-08-14 DIAGNOSIS — L820 Inflamed seborrheic keratosis: Secondary | ICD-10-CM | POA: Diagnosis not present

## 2017-08-14 DIAGNOSIS — Z85828 Personal history of other malignant neoplasm of skin: Secondary | ICD-10-CM | POA: Diagnosis not present

## 2017-08-14 DIAGNOSIS — Z1283 Encounter for screening for malignant neoplasm of skin: Secondary | ICD-10-CM | POA: Diagnosis not present

## 2017-08-14 DIAGNOSIS — D229 Melanocytic nevi, unspecified: Secondary | ICD-10-CM | POA: Diagnosis not present

## 2017-09-11 DIAGNOSIS — F411 Generalized anxiety disorder: Secondary | ICD-10-CM | POA: Diagnosis not present

## 2017-09-11 DIAGNOSIS — F3281 Premenstrual dysphoric disorder: Secondary | ICD-10-CM | POA: Diagnosis not present

## 2017-09-11 DIAGNOSIS — F330 Major depressive disorder, recurrent, mild: Secondary | ICD-10-CM | POA: Diagnosis not present

## 2017-09-12 DIAGNOSIS — H6693 Otitis media, unspecified, bilateral: Secondary | ICD-10-CM | POA: Diagnosis not present

## 2017-09-12 DIAGNOSIS — J069 Acute upper respiratory infection, unspecified: Secondary | ICD-10-CM | POA: Diagnosis not present

## 2017-09-17 ENCOUNTER — Ambulatory Visit: Payer: BLUE CROSS/BLUE SHIELD | Admitting: Dietician

## 2017-09-19 DIAGNOSIS — H6500 Acute serous otitis media, unspecified ear: Secondary | ICD-10-CM | POA: Diagnosis not present

## 2017-09-19 DIAGNOSIS — H6980 Other specified disorders of Eustachian tube, unspecified ear: Secondary | ICD-10-CM | POA: Diagnosis not present

## 2017-09-21 ENCOUNTER — Encounter: Payer: Self-pay | Admitting: Dietician

## 2017-09-21 NOTE — Progress Notes (Signed)
Patient cancelled appointment for 09/17/17, and does not wish to reschedule at this time. Sent letter to referring provider.

## 2017-09-25 DIAGNOSIS — E538 Deficiency of other specified B group vitamins: Secondary | ICD-10-CM | POA: Diagnosis not present

## 2017-09-25 DIAGNOSIS — E559 Vitamin D deficiency, unspecified: Secondary | ICD-10-CM | POA: Diagnosis not present

## 2017-09-25 DIAGNOSIS — G35 Multiple sclerosis: Secondary | ICD-10-CM | POA: Diagnosis not present

## 2017-09-25 DIAGNOSIS — R635 Abnormal weight gain: Secondary | ICD-10-CM | POA: Diagnosis not present

## 2017-12-15 DIAGNOSIS — S4991XA Unspecified injury of right shoulder and upper arm, initial encounter: Secondary | ICD-10-CM | POA: Diagnosis not present

## 2017-12-15 DIAGNOSIS — M25421 Effusion, right elbow: Secondary | ICD-10-CM | POA: Diagnosis not present

## 2017-12-15 DIAGNOSIS — S52124A Nondisplaced fracture of head of right radius, initial encounter for closed fracture: Secondary | ICD-10-CM | POA: Diagnosis not present

## 2017-12-15 DIAGNOSIS — S59911A Unspecified injury of right forearm, initial encounter: Secondary | ICD-10-CM | POA: Diagnosis not present

## 2017-12-18 DIAGNOSIS — S80212A Abrasion, left knee, initial encounter: Secondary | ICD-10-CM | POA: Diagnosis not present

## 2017-12-18 DIAGNOSIS — W010XXA Fall on same level from slipping, tripping and stumbling without subsequent striking against object, initial encounter: Secondary | ICD-10-CM | POA: Diagnosis not present

## 2017-12-18 DIAGNOSIS — M25562 Pain in left knee: Secondary | ICD-10-CM | POA: Diagnosis not present

## 2017-12-18 DIAGNOSIS — S52124A Nondisplaced fracture of head of right radius, initial encounter for closed fracture: Secondary | ICD-10-CM | POA: Diagnosis not present

## 2017-12-26 DIAGNOSIS — M25521 Pain in right elbow: Secondary | ICD-10-CM | POA: Diagnosis not present

## 2017-12-26 DIAGNOSIS — S52124D Nondisplaced fracture of head of right radius, subsequent encounter for closed fracture with routine healing: Secondary | ICD-10-CM | POA: Diagnosis not present

## 2018-01-09 DIAGNOSIS — M25521 Pain in right elbow: Secondary | ICD-10-CM | POA: Diagnosis not present

## 2018-01-09 DIAGNOSIS — S52124D Nondisplaced fracture of head of right radius, subsequent encounter for closed fracture with routine healing: Secondary | ICD-10-CM | POA: Diagnosis not present

## 2018-01-15 ENCOUNTER — Ambulatory Visit: Payer: BLUE CROSS/BLUE SHIELD | Attending: Orthopedic Surgery

## 2018-01-15 DIAGNOSIS — M25621 Stiffness of right elbow, not elsewhere classified: Secondary | ICD-10-CM | POA: Diagnosis not present

## 2018-01-15 DIAGNOSIS — M25521 Pain in right elbow: Secondary | ICD-10-CM | POA: Diagnosis not present

## 2018-01-15 NOTE — Addendum Note (Signed)
Addended by: Blain Pais on: 01/15/2018 01:03 PM   Modules accepted: Orders

## 2018-01-15 NOTE — Therapy (Signed)
[facility] [address] [place], [address] Phone: [phone]   Fax:  [phone]  Physical Therapy Evaluation  Patient Details  Name: Michelle Ruiz MRN: [id number] Date of Birth: 1970/06/16 Referring Provider (PT): Rachelle Hora   Encounter Date: 01/15/2018  PT End of Session - 01/15/18 0924    Visit Number  1    Number of Visits  13    Date for PT Re-Evaluation  02/26/18    Authorization Type  BCBS    PT Start Time  0805    PT Stop Time  0905    PT Time Calculation (min)  60 min    Activity Tolerance  Patient tolerated treatment well    Behavior During Therapy  [facility] for tasks assessed/performed       Past Medical History:  Diagnosis Date  . Abnormal Pap smear of cervix   . Anxiety   . Kidney stones   . MS (multiple sclerosis) ([place])   . UTI (urinary tract infection)     Past Surgical History:  Procedure Laterality Date  . BREAST BIOPSY Right 2010   stereotactic biopsy/ neg/ dr.byrnett  . BREAST BIOPSY Left 2012   neg/dr. brynett  . CESAREAN SECTION  2008   RPH    There were no vitals filed for this visit.   Subjective Assessment - 01/15/18 0915    Subjective  Patient states increased R elbow fracture and pain after sustaining a fall 12/14/2017. Patient reports difficulty in moving her elbow into flexion/extension and performing supination/pronation on the R side. Patient reports a history of fall risk secondary to having foot drop along her L LE. Patient reports she was in physical therapy before for balance difficulties from her diagnosis of MS. Patient states she has difficulties with donning/doffing her bra and washing her hair. Patient reports overall improvement in symtpoms but continues to have difficulties otherwise. Patient states she has no pain at baseline but increased pain at end range and all elbow directions. Patient reports she is afraid to move her shoulder and R UE secondary to  pain.     Pertinent History  Patient reports history of falls x3 in the past 6 months secondary to drop foot on the L LE. PMH: Multiple sclerosis; elbow fracture of the R UE    Limitations  Lifting    Currently in Pain?  No/denies    Pain Score  0-No pain   worst pain: 8/10   Pain Location  Elbow    Pain Orientation  Right    Pain Descriptors / Indicators  Aching;Sore    Pain Type  Acute pain    Pain Onset  More than a month ago    Pain Frequency  Intermittent         [facility] PT Assessment - 01/15/18 0811      Assessment   Medical Diagnosis  R elbow fracture    Referring Provider (PT)  Rachelle Hora    Onset Date/Surgical Date  12/14/17    Hand Dominance  Right    Next MD Visit  unknown    Prior Therapy  yes- balance      Balance Screen   Has the patient fallen in the past 6 months  Yes    How many times?  3    Has the patient had a decrease in activity level because of a fear of falling?   Yes    Is the patient reluctant to  leave their home because of a fear of falling?   No      Home Film/video editor residence    Living Arrangements  Spouse/significant other;Children      Prior Function   Level of Independence  Independent    Vocation  Full time employment    Vocation Requirements  typing    Leisure  work, Associate Professor, stationary bike      Cognition   Overall Cognitive Status  Within Functional Limits for tasks assessed      Observation/Other Assessments   Observations  Increased forward head posture and forward rounded shoulders      Sensation   Light Touch  Appears Intact      Posture/Postural Control   Posture Comments  Forward rounded shoulders, forward head posture       ROM / Strength   AROM / PROM / Strength  AROM;Strength      AROM   AROM Assessment Site  Elbow;Forearm;Shoulder    Right/Left Shoulder  Right;Left    Right Shoulder Flexion  170 Degrees    Right Shoulder ABduction  170 Degrees    Right Shoulder Internal Rotation   90 Degrees   painful at end rnge   Right Shoulder External Rotation  80 Degrees   Increased pain at end range   Left Shoulder Flexion  170 Degrees    Left Shoulder ABduction  170 Degrees    Left Shoulder Internal Rotation  90 Degrees    Left Shoulder External Rotation  80 Degrees    Right/Left Elbow  Right;Left    Right/Left Forearm  Right;Left    Right Forearm Pronation  85 Degrees   Inreased pain at end range   Right Forearm Supination  80 Degrees   Increased pain at end range   Left Forearm Pronation  85 Degrees    Left Forearm Supination  80 Degrees    Right/Left Wrist  --      Strength   Strength Assessment Site  Forearm;Elbow;Shoulder    Right/Left Shoulder  Left;Right    Right Shoulder Flexion  4/5    Right Shoulder ABduction  4/5    Right Shoulder Internal Rotation  4-/5    Right Shoulder External Rotation  4-/5    Left Shoulder Flexion  5/5    Left Shoulder ABduction  5/5    Left Shoulder Internal Rotation  4+/5    Left Shoulder External Rotation  4+/5    Right/Left Elbow  Right;Left    Right Elbow Flexion  4-/5   Increased pain   Right Elbow Extension  4-/5   Increased pain   Left Elbow Flexion  5/5    Left Elbow Extension  5/5    Right/Left Forearm  Right;Left    Right Forearm Pronation  4-/5   Increased pain   Right Forearm Supination  4-/5   Increased pain   Left Forearm Pronation  5/5    Left Forearm Supination  5/5      Palpation   Palpation comment  Increased muscular spasms along tricep tendon, forearm supinators       Ambulation/Gait   Gait Comments  Increased dorsiflexion along L LE        Objective measurements completed on examination: See above findings.    TREATMENT  Therapeutic Exercise: Supination/pronation in sitting -- 2x 20  Elbow flexion/extension with OP into extension -- 2x 20  Patient demonstrates no increase in pain at the end of the session  PT Education - 01/15/18 [phone]    Education Details  HEP: elbow  flexion/extension, supination/pronation; not to lift anything over 5 # on the R side; POC and expectations with therapy    Person(s) Educated  Patient    Methods  Explanation;Demonstration;Handout    Comprehension  Returned demonstration;Verbalized understanding          PT Long Term Goals - 01/15/18 0934      PT LONG TERM GOAL #1   Title  Patient will be independent with HEP to continue benefits of therapy after discharge.    Baseline  dependent with form/technique    Time  6    Period  Weeks    Status  New    Target Date  02/26/18      PT LONG TERM GOAL #2   Title  Patient will have a worst elbow pain of 0/10 with all movements to indicate improvement with performing typing actions    Baseline  worst pain: 8/10    Time  6    Period  Weeks    Status  New    Target Date  02/26/18      PT LONG TERM GOAL #3   Title  Patient will be able to donn/doff her bra without increased difficulties or pain to better be able to self-dress without assistance.    Baseline  Increased pain with peformance    Time  6    Period  Weeks    Status  New    Target Date  02/26/18             Plan - 01/15/18 0925    Clinical Impression Statement  Michelle Ruiz is a 47 yo right hand dominant female presenting with increased pain along her R elbow s/p a fall on 12/14/2017. Patient demosntrates increased elbow dysfunction with increased muscular guarding along the elbow restricting motion. Patient demonstrates similar AROM on the R vs the L with the exception of performing elbow extension which is restricted 10 degrees. Patient is overall hesistant to elbow motion and goals of therapy will be focused on decreasing guarding to allow further motion. Patient also demonstrates restrictions and pain with R shoulder motion most likely from muscular guarding; will further address limitations at a later date. Patient will benefit from further skilled therapy to return to prior level of function     History and  Personal Factors relevant to plan of care:  Hx of MS; requent falls    Clinical Presentation  Stable    Clinical Presentation due to:  Pain not improving    Clinical Decision Making  Moderate    Rehab Potential  Good    Clinical Impairments Affecting Rehab Potential  (-) hx of MS; (+) good restriction    PT Frequency  2x / week    PT Duration  6 weeks    PT Treatment/Interventions  Moist Heat;Iontophoresis 4mg /ml Dexamethasone;Electrical Stimulation;Aquatic Therapy;Cryotherapy;Therapeutic activities;Neuromuscular re-education;Therapeutic exercise;Patient/family education;Dry needling;Manual techniques;Passive range of motion;Balance training;Functional mobility training;Gait training;Stair training;Joint Manipulations;Taping    PT Next Visit Plan  address shoulder limitations, screen balance    PT Home Exercise Plan  See education    Consulted and Agree with Plan of Care  Patient       Patient will benefit from skilled therapeutic intervention in order to improve the following deficits and impairments:  Pain, Decreased coordination, Decreased endurance, Decreased range of motion, Decreased strength, Hypomobility, Impaired perceived functional ability, Increased fascial restricitons, Increased muscle spasms, Difficulty walking  Visit  Diagnosis: Pain in right elbow  Stiffness of right elbow, not elsewhere classified     Problem List There are no active problems to display for this patient.   [name], PT DPT 01/15/2018, 12:59 PM  [facility] [address], [place], [address] Phone: [phone]   Fax:  [phone]  Name: Michelle Ruiz MRN: [id number] Date of Birth: 1971-02-17

## 2018-01-17 ENCOUNTER — Ambulatory Visit: Payer: BLUE CROSS/BLUE SHIELD

## 2018-01-17 DIAGNOSIS — M25521 Pain in right elbow: Secondary | ICD-10-CM | POA: Diagnosis not present

## 2018-01-17 DIAGNOSIS — M25621 Stiffness of right elbow, not elsewhere classified: Secondary | ICD-10-CM | POA: Diagnosis not present

## 2018-01-17 NOTE — Therapy (Signed)
[facility] [address], [place], [address] Phone: [phone]   Fax:  [phone]  Physical Therapy Treatment  Patient Details  Name: Michelle Ruiz MRN: [id number] Date of Birth: October 30, 1970 Referring Provider (PT): Rachelle Hora   Encounter Date: 01/17/2018  PT End of Session - 01/17/18 1150    Visit Number  2    Number of Visits  13    Date for PT Re-Evaluation  02/26/18    Authorization Type  BCBS    PT Start Time  0815    PT Stop Time  0900    PT Time Calculation (min)  45 min    Activity Tolerance  Patient tolerated treatment well    Behavior During Therapy  [facility] for tasks assessed/performed       Past Medical History:  Diagnosis Date  . Abnormal Pap smear of cervix   . Anxiety   . Kidney stones   . MS (multiple sclerosis) ([place])   . UTI (urinary tract infection)     Past Surgical History:  Procedure Laterality Date  . BREAST BIOPSY Right 2010   stereotactic biopsy/ neg/ dr.byrnett  . BREAST BIOPSY Left 2012   neg/dr. brynett  . CESAREAN SECTION  2008   [facility]    There were no vitals filed for this visit.  Subjective Assessment - 01/17/18 0844    Subjective  Patient reports she felt increased popping in her elbow and states she tired to perform the exercises but had increased popping and stopped.     Pertinent History  Patient reports history of falls x3 in the past 6 months secondary to drop foot on the L LE. PMH: Multiple sclerosis; elbow fracture of the R UE    Limitations  Lifting    Currently in Pain?  No/denies    Pain Onset  More than a month ago       TREATMENT  Therapeutic Exercise Forearm maze - x3 back and forth on the R UE Pronation/supination in R UE - x30 Elbow/flexion in R UE- x 30  Tricep extension in standing - OMEGA 2 x 10 5# Shoulder rows in standing - OMEGA x 20 5# Single leg stance - 2 x 20sec Tandem stance balance - 2 x 20sec Patient demonstrates increased  fatigue at the end of the session   PT Education - 01/17/18 1150    Education Details  HEP: single leg stance    Person(s) Educated  Patient    Methods  Explanation;Demonstration    Comprehension  Verbalized understanding;Returned demonstration          PT Long Term Goals - 01/15/18 0934      PT LONG TERM GOAL #1   Title  Patient will be independent with HEP to continue benefits of therapy after discharge.    Baseline  dependent with form/technique    Time  6    Period  Weeks    Status  New    Target Date  02/26/18      PT LONG TERM GOAL #2   Title  Patient will have a worst elbow pain of 0/10 with all movements to indicate improvement with performing typing actions    Baseline  worst pain: 8/10    Time  6    Period  Weeks    Status  New    Target Date  02/26/18      PT LONG TERM GOAL #3   Title  Patient  will be able to donn/doff her bra without increased difficulties or pain to better be able to self-dress without assistance.    Baseline  Increased pain with peformance    Time  6    Period  Weeks    Status  New    Target Date  02/26/18            Plan - 01/17/18 1151    Clinical Impression Statement  Patient demosntrates poor balance most notably with performing static balancing with narrow BOS. Patient demonstrates decreased dorsiflexion AROM and MMT along the L side and educated patient to wear dorsiflexion brace to help with pain. Patient demonstrates improvement with speed of movement with elbow function but continues to have increased pain at end range. Patient will benefit from further skilled therapy to return to prior level of function.     Rehab Potential  Good    Clinical Impairments Affecting Rehab Potential  (-) hx of MS; (+) good restriction    PT Frequency  2x / week    PT Duration  6 weeks    PT Treatment/Interventions  Moist Heat;Iontophoresis 4mg /ml Dexamethasone;Electrical Stimulation;Aquatic Therapy;Cryotherapy;Therapeutic  activities;Neuromuscular re-education;Therapeutic exercise;Patient/family education;Dry needling;Manual techniques;Passive range of motion;Balance training;Functional mobility training;Gait training;Stair training;Joint Manipulations;Taping    PT Next Visit Plan  address shoulder limitations, screen balance    PT Home Exercise Plan  See education    Consulted and Agree with Plan of Care  Patient       Patient will benefit from skilled therapeutic intervention in order to improve the following deficits and impairments:  Pain, Decreased coordination, Decreased endurance, Decreased range of motion, Decreased strength, Hypomobility, Impaired perceived functional ability, Increased fascial restricitons, Increased muscle spasms, Difficulty walking  Visit Diagnosis: Pain in right elbow  Stiffness of right elbow, not elsewhere classified     Problem List There are no active problems to display for this patient.   [name], PT DPT 01/17/2018, 11:55 AM  [facility] [address], [place], [address] Phone: [phone]   Fax:  [phone]  Name: Michelle Ruiz MRN: [id number] Date of Birth: [birth year]

## 2018-01-22 ENCOUNTER — Ambulatory Visit: Payer: BLUE CROSS/BLUE SHIELD

## 2018-01-22 DIAGNOSIS — M25621 Stiffness of right elbow, not elsewhere classified: Secondary | ICD-10-CM | POA: Diagnosis not present

## 2018-01-22 DIAGNOSIS — M25521 Pain in right elbow: Secondary | ICD-10-CM

## 2018-01-22 NOTE — Therapy (Signed)
[facility] [address], [place], [address] Phone: [phone]   Fax:  [phone]  Physical Therapy Treatment  Patient Details  Name: Michelle Ruiz MRN: [id number] Date of Birth: 1970/08/12 Referring Provider (PT): Rachelle Hora   Encounter Date: 01/22/2018  PT End of Session - 01/22/18 0945    Visit Number  3    Number of Visits  13    Date for PT Re-Evaluation  02/26/18    Authorization Type  BCBS    PT Start Time  0815    PT Stop Time  0900    PT Time Calculation (min)  45 min    Activity Tolerance  Patient tolerated treatment well    Behavior During Therapy  [facility] for tasks assessed/performed       Past Medical History:  Diagnosis Date  . Abnormal Pap smear of cervix   . Anxiety   . Kidney stones   . MS (multiple sclerosis) ([place])   . UTI (urinary tract infection)     Past Surgical History:  Procedure Laterality Date  . BREAST BIOPSY Right 2010   stereotactic biopsy/ neg/ dr.byrnett  . BREAST BIOPSY Left 2012   neg/dr. brynett  . CESAREAN SECTION  2008   [facility]    There were no vitals filed for this visit.  Subjective Assessment - 01/22/18 0845    Subjective  Patient reports she is improving and was able to use her mouse all day yesterday.     Pertinent History  Patient reports history of falls x3 in the past 6 months secondary to drop foot on the L LE. PMH: Multiple sclerosis; elbow fracture of the R UE    Limitations  Lifting    Currently in Pain?  No/denies    Pain Onset  More than a month ago       TREATMENT  Therapeutic Exercise Pronation/supination in R UE - x30 with 1#db Elbow/flexion in R UE- x 30 1#db Tricep extension in standing - OMEGA 2 x 10 5# [place] behind back with 1# weight for shoulder IR - x 20  Tandem stance - x30   Patient demonstrates increased fatigue at the end of the session   PT Education - 01/22/18 0853    Education Details  HEP: tandem stance    Person(s) Educated  Patient    Methods  Explanation;Demonstration    Comprehension  Verbalized understanding;Returned demonstration          PT Long Term Goals - 01/15/18 0934      PT LONG TERM GOAL #1   Title  Patient will be independent with HEP to continue benefits of therapy after discharge.    Baseline  dependent with form/technique    Time  6    Period  Weeks    Status  New    Target Date  02/26/18      PT LONG TERM GOAL #2   Title  Patient will have a worst elbow pain of 0/10 with all movements to indicate improvement with performing typing actions    Baseline  worst pain: 8/10    Time  6    Period  Weeks    Status  New    Target Date  02/26/18      PT LONG TERM GOAL #3   Title  Patient will be able to donn/doff her bra without increased difficulties or pain to better be able to self-dress without assistance.  Baseline  Increased pain with peformance    Time  6    Period  Weeks    Status  New    Target Date  02/26/18            Plan - 01/22/18 0946    Clinical Impression Statement  Patient demonstrates improvement with pain and spasms with performing exercises indicating functional carryover between treatment sessions. Patient continues to have increased difficulty with  pronation and supination and patient will benefit from further skilled therapy to return to prior level of function.     Rehab Potential  Good    Clinical Impairments Affecting Rehab Potential  (-) hx of MS; (+) good restriction    PT Frequency  2x / week    PT Duration  6 weeks    PT Treatment/Interventions  Moist Heat;Iontophoresis 4mg /ml Dexamethasone;Electrical Stimulation;Aquatic Therapy;Cryotherapy;Therapeutic activities;Neuromuscular re-education;Therapeutic exercise;Patient/family education;Dry needling;Manual techniques;Passive range of motion;Balance training;Functional mobility training;Gait training;Stair training;Joint Manipulations;Taping    PT Next Visit Plan  address shoulder  limitations, screen balance    PT Home Exercise Plan  See education    Consulted and Agree with Plan of Care  Patient       Patient will benefit from skilled therapeutic intervention in order to improve the following deficits and impairments:  Pain, Decreased coordination, Decreased endurance, Decreased range of motion, Decreased strength, Hypomobility, Impaired perceived functional ability, Increased fascial restricitons, Increased muscle spasms, Difficulty walking  Visit Diagnosis: Pain in right elbow  Stiffness of right elbow, not elsewhere classified     Problem List There are no active problems to display for this patient.   [name], PT DPT 01/22/2018, 10:10 AM  [facility] [address], [place], [address] Phone: [phone]   Fax:  [phone]  Name: Michelle Ruiz MRN: [id number] Date of Birth: [birth year]

## 2018-01-24 ENCOUNTER — Ambulatory Visit: Payer: BLUE CROSS/BLUE SHIELD

## 2018-01-24 DIAGNOSIS — M25521 Pain in right elbow: Secondary | ICD-10-CM | POA: Diagnosis not present

## 2018-01-24 DIAGNOSIS — M25621 Stiffness of right elbow, not elsewhere classified: Secondary | ICD-10-CM

## 2018-01-24 NOTE — Therapy (Signed)
[facility] [address], [place], [address] Phone: [phone]   Fax:  [phone]  Physical Therapy Treatment  Patient Details  Name: Michelle Ruiz MRN: [id number] Date of Birth: [birth year] Referring Provider (PT): Rachelle Hora   Encounter Date: 01/24/2018  PT End of Session - 01/24/18 0847    Visit Number  4    Number of Visits  13    Date for PT Re-Evaluation  02/26/18    Authorization Type  BCBS    PT Start Time  0815    PT Stop Time  0900    PT Time Calculation (min)  45 min    Activity Tolerance  Patient tolerated treatment well    Behavior During Therapy  [facility] for tasks assessed/performed       Past Medical History:  Diagnosis Date  . Abnormal Pap smear of cervix   . Anxiety   . Kidney stones   . MS (multiple sclerosis) ([place])   . UTI (urinary tract infection)     Past Surgical History:  Procedure Laterality Date  . BREAST BIOPSY Right 2010   stereotactic biopsy/ neg/ dr.byrnett  . BREAST BIOPSY Left 2012   neg/dr. brynett  . CESAREAN SECTION  2008   RPH    There were no vitals filed for this visit.  Subjective Assessment - 01/24/18 0832    Subjective  Patient reports she is improving but continues to have issues with strapping her bra strap and combing her hair.     Pertinent History  Patient reports history of falls x3 in the past 6 months secondary to drop foot on the L LE. PMH: Multiple sclerosis; elbow fracture of the R UE    Limitations  Lifting    Currently in Pain?  No/denies    Pain Onset  More than a month ago       TREATMENT  Therapeutic Exercise Pronation/supination in R UE - x30 with 1#db Elbow/flexion in R UE- x 30 2#db Shoulder IR behind the back with towel - x20  Behind the back push offs - x 30 Tricep push downs unilaterally - x 30  Tandem stance - x30  Shoulder extension with YTB - x 30 Single leg stance in standing - x 30sec Feet together balance on  airex pad - x 30    Patient demonstrates increased fatigue at the end of the session    PT Education - 01/24/18 0846    Education Details  form/technique with exercise    Person(s) Educated  Patient    Methods  Explanation;Demonstration    Comprehension  Verbalized understanding;Returned demonstration          PT Long Term Goals - 01/15/18 0934      PT LONG TERM GOAL #1   Title  Patient will be independent with HEP to continue benefits of therapy after discharge.    Baseline  dependent with form/technique    Time  6    Period  Weeks    Status  New    Target Date  02/26/18      PT LONG TERM GOAL #2   Title  Patient will have a worst elbow pain of 0/10 with all movements to indicate improvement with performing typing actions    Baseline  worst pain: 8/10    Time  6    Period  Weeks    Status  New    Target Date  02/26/18  PT LONG TERM GOAL #3   Title  Patient will be able to donn/doff her bra without increased difficulties or pain to better be able to self-dress without assistance.    Baseline  Increased pain with peformance    Time  6    Period  Weeks    Status  New    Target Date  02/26/18            Plan - 01/24/18 0847    Clinical Impression Statement  Patietn demosntrats improvement with exercise technique and performance of shoulder mobility indicating functional carryover between visits. Patient continues to demosntrates increased balance difficulties and increased pain with performing tricep extension. Patient will benefit from further skilled therapy to return to prior level of function.     Rehab Potential  Good    Clinical Impairments Affecting Rehab Potential  (-) hx of MS; (+) good restriction    PT Frequency  2x / week    PT Duration  6 weeks    PT Treatment/Interventions  Moist Heat;Iontophoresis 4mg /ml Dexamethasone;Electrical Stimulation;Aquatic Therapy;Cryotherapy;Therapeutic activities;Neuromuscular re-education;Therapeutic  exercise;Patient/family education;Dry needling;Manual techniques;Passive range of motion;Balance training;Functional mobility training;Gait training;Stair training;Joint Manipulations;Taping    PT Next Visit Plan  address shoulder limitations, screen balance    PT Home Exercise Plan  See education    Consulted and Agree with Plan of Care  Patient       Patient will benefit from skilled therapeutic intervention in order to improve the following deficits and impairments:  Pain, Decreased coordination, Decreased endurance, Decreased range of motion, Decreased strength, Hypomobility, Impaired perceived functional ability, Increased fascial restricitons, Increased muscle spasms, Difficulty walking  Visit Diagnosis: Stiffness of right elbow, not elsewhere classified  Pain in right elbow     Problem List There are no active problems to display for this patient.   [name], PT DPT 01/24/2018, 9:00 AM  [facility] [address], [place], [address] Phone: [phone]   Fax:  [phone]  Name: Michelle Ruiz MRN: [id number] Date of Birth: 01/15/1971

## 2018-01-25 DIAGNOSIS — R441 Visual hallucinations: Secondary | ICD-10-CM | POA: Diagnosis not present

## 2018-01-25 DIAGNOSIS — R2689 Other abnormalities of gait and mobility: Secondary | ICD-10-CM | POA: Diagnosis not present

## 2018-01-25 DIAGNOSIS — G35 Multiple sclerosis: Secondary | ICD-10-CM | POA: Diagnosis not present

## 2018-01-25 DIAGNOSIS — Z8659 Personal history of other mental and behavioral disorders: Secondary | ICD-10-CM | POA: Diagnosis not present

## 2018-01-28 ENCOUNTER — Ambulatory Visit: Payer: BLUE CROSS/BLUE SHIELD | Attending: Orthopedic Surgery

## 2018-01-28 DIAGNOSIS — M25621 Stiffness of right elbow, not elsewhere classified: Secondary | ICD-10-CM | POA: Diagnosis not present

## 2018-01-28 DIAGNOSIS — R262 Difficulty in walking, not elsewhere classified: Secondary | ICD-10-CM | POA: Diagnosis not present

## 2018-01-28 DIAGNOSIS — Z9181 History of falling: Secondary | ICD-10-CM | POA: Insufficient documentation

## 2018-01-28 DIAGNOSIS — M25521 Pain in right elbow: Secondary | ICD-10-CM | POA: Diagnosis not present

## 2018-01-28 NOTE — Therapy (Signed)
[facility] [address] [place], [address] Phone: [phone]   Fax:  [phone]  Physical Therapy Treatment  Patient Details  Name: Michelle Ruiz MRN: [id number] Date of Birth: 1970/04/21 Referring Provider (PT): Rachelle Hora   Encounter Date: 01/28/2018  PT End of Session - 01/28/18 1826    Visit Number  5    Number of Visits  13    Date for PT Re-Evaluation  02/26/18    Authorization Type  BCBS    PT Start Time  1800    PT Stop Time  1845    PT Time Calculation (min)  45 min    Activity Tolerance  Patient tolerated treatment well    Behavior During Therapy  [facility] for tasks assessed/performed       Past Medical History:  Diagnosis Date  . Abnormal Pap smear of cervix   . Anxiety   . Kidney stones   . MS (multiple sclerosis) ([place])   . UTI (urinary tract infection)     Past Surgical History:  Procedure Laterality Date  . BREAST BIOPSY Right 2010   stereotactic biopsy/ neg/ dr.byrnett  . BREAST BIOPSY Left 2012   neg/dr. brynett  . CESAREAN SECTION  2008   [facility]    There were no vitals filed for this visit.  Subjective Assessment - 01/28/18 1807    Subjective  Patient reports she saw her neurologist who gave her a prescription for balance and a walkaide brace.     Pertinent History  Patient reports history of falls x3 in the past 6 months secondary to drop foot on the L LE. PMH: Multiple sclerosis; elbow fracture of the R UE    Limitations  Lifting    Currently in Pain?  No/denies    Pain Onset  More than a month ago          TREATMENT  Manual Therapy Shoulder mobilizations with patient in supine to improve mobility and decrease pain along the R UE grade III inferiorly, distraction, and posteriorly 3 x 30sec  Therapeutic Exercise Pronation/supination in R UE - x30 with 2#db Elbow/flexion in R UE- x 30 2#db Shoulder IR behind the back with towel - x20  Shoulder IR in standing - x 20  with YTB Tricep push downs bilaterally - x 30 RTB B Dorsiflexion in standing - 2 x 20    Patient demonstrates increased fatigue at the end of the session    PT Education - 01/28/18 1826    Education Details  Behind the back shoulder IR    Person(s) Educated  Patient    Methods  Demonstration;Explanation    Comprehension  Verbalized understanding;Returned demonstration          PT Long Term Goals - 01/15/18 0934      PT LONG TERM GOAL #1   Title  Patient will be independent with HEP to continue benefits of therapy after discharge.    Baseline  dependent with form/technique    Time  6    Period  Weeks    Status  New    Target Date  02/26/18      PT LONG TERM GOAL #2   Title  Patient will have a worst elbow pain of 0/10 with all movements to indicate improvement with performing typing actions    Baseline  worst pain: 8/10    Time  6    Period  Weeks    Status  New    Target Date  02/26/18      PT LONG TERM GOAL #3   Title  Patient will be able to donn/doff her bra without increased difficulties or pain to better be able to self-dress without assistance.    Baseline  Increased pain with peformance    Time  6    Period  Weeks    Status  New    Target Date  02/26/18            Plan - 01/28/18 1834    Clinical Impression Statement  Patient demonstrates improvement in shoulder IR after performing shoulder mobilization posteriorly and inferiorly indicating improvement in tissue elasticity. Patient demonstrates increased weakness and pain with performing end range ER/IR and will benefit from further skilled therapy to return to prior level of function.     Rehab Potential  Good    Clinical Impairments Affecting Rehab Potential  (-) hx of MS; (+) good restriction    PT Frequency  2x / week    PT Duration  6 weeks    PT Treatment/Interventions  Moist Heat;Iontophoresis 4mg /ml Dexamethasone;Electrical Stimulation;Aquatic Therapy;Cryotherapy;Therapeutic  activities;Neuromuscular re-education;Therapeutic exercise;Patient/family education;Dry needling;Manual techniques;Passive range of motion;Balance training;Functional mobility training;Gait training;Stair training;Joint Manipulations;Taping    PT Next Visit Plan  address shoulder limitations, screen balance    PT Home Exercise Plan  See education    Consulted and Agree with Plan of Care  Patient       Patient will benefit from skilled therapeutic intervention in order to improve the following deficits and impairments:  Pain, Decreased coordination, Decreased endurance, Decreased range of motion, Decreased strength, Hypomobility, Impaired perceived functional ability, Increased fascial restricitons, Increased muscle spasms, Difficulty walking  Visit Diagnosis: Stiffness of right elbow, not elsewhere classified  Pain in right elbow     Problem List There are no active problems to display for this patient.   [name], PT DPT 01/28/2018, 6:43 PM  [facility] [address], [place], [address] Phone: [phone]   Fax:  [phone]  Name: Michelle Ruiz MRN: [id number] Date of Birth: 1971-01-21

## 2018-01-30 ENCOUNTER — Ambulatory Visit: Payer: BLUE CROSS/BLUE SHIELD

## 2018-01-30 DIAGNOSIS — M25621 Stiffness of right elbow, not elsewhere classified: Secondary | ICD-10-CM | POA: Diagnosis not present

## 2018-01-30 DIAGNOSIS — Z9181 History of falling: Secondary | ICD-10-CM | POA: Diagnosis not present

## 2018-01-30 DIAGNOSIS — M25521 Pain in right elbow: Secondary | ICD-10-CM | POA: Diagnosis not present

## 2018-01-30 DIAGNOSIS — R262 Difficulty in walking, not elsewhere classified: Secondary | ICD-10-CM | POA: Diagnosis not present

## 2018-01-30 DIAGNOSIS — S52124D Nondisplaced fracture of head of right radius, subsequent encounter for closed fracture with routine healing: Secondary | ICD-10-CM | POA: Diagnosis not present

## 2018-01-30 NOTE — Therapy (Signed)
[facility] [facility] [address], [place], [address] Phone: [phone]   Fax:  [phone]  Physical Therapy Treatment  Patient Details  Name: Michelle Ruiz MRN: [id number] Date of Birth: [birth year] Referring Provider (PT): Rachelle Hora   Encounter Date: 01/30/2018  PT End of Session - 01/30/18 1712    Visit Number  6    Number of Visits  13    Date for PT Re-Evaluation  02/26/18    Authorization Type  BCBS    PT Start Time  1630    PT Stop Time  1711    PT Time Calculation (min)  41 min    Activity Tolerance  Patient tolerated treatment well    Behavior During Therapy  [facility] for tasks assessed/performed       Past Medical History:  Diagnosis Date  . Abnormal Pap smear of cervix   . Anxiety   . Kidney stones   . MS (multiple sclerosis) ([place])   . UTI (urinary tract infection)     Past Surgical History:  Procedure Laterality Date  . BREAST BIOPSY Right 2010   stereotactic biopsy/ neg/ dr.byrnett  . BREAST BIOPSY Left 2012   neg/dr. brynett  . CESAREAN SECTION  2008   RPH    There were no vitals filed for this visit.  Subjective Assessment - 01/30/18 1703    Subjective  Patient reports she saw her orthopedic physician who reports she is cleared from her elbow fracture but states the physician would like her to continue physical therapy    Pertinent History  Patient reports history of falls x3 in the past 6 months secondary to drop foot on the L LE. PMH: Multiple sclerosis; elbow fracture of the R UE    Limitations  Lifting    Currently in Pain?  No/denies    Pain Onset  More than a month ago         TREATMENT    Therapeutic Exercise Shoulder IR with blue molding - x 20   Shoulder IR in standing - x 20 with YTB Tricep push downs bilaterally - x 20 5# Shoulder flexion with RTB - x 20  Shoulder flexion with yellow physioball - x 20  Shoulder ER with hand behind head - x 20  Shoulder retraction  rows - x 20 10# B Dorsiflexion in standing - 2 x 20 5#  Single leg stance - 2 x 30sec on airex pad  Tandem stance with lumbar rotation for balance - x 20     Patient demonstrates increased fatigue at the end of the session    PT Education - 01/30/18 1708    Education Details  Behind the back blue clay manipulations    Person(s) Educated  Patient    Methods  Explanation;Demonstration    Comprehension  Verbalized understanding;Returned demonstration          PT Long Term Goals - 01/15/18 0934      PT LONG TERM GOAL #1   Title  Patient will be independent with HEP to continue benefits of therapy after discharge.    Baseline  dependent with form/technique    Time  6    Period  Weeks    Status  New    Target Date  02/26/18      PT LONG TERM GOAL #2   Title  Patient will have a worst elbow pain of 0/10 with all movements to indicate improvement with performing typing  actions    Baseline  worst pain: 8/10    Time  6    Period  Weeks    Status  New    Target Date  02/26/18      PT LONG TERM GOAL #3   Title  Patient will be able to donn/doff her bra without increased difficulties or pain to better be able to self-dress without assistance.    Baseline  Increased pain with peformance    Time  6    Period  Weeks    Status  New    Target Date  02/26/18            Plan - 01/30/18 1712    Clinical Impression Statement  Patient demonstrates improvement in shoulder IR at the end of the session. Patient demonstrates improvement with strength but continues to have increased pain with end range movement of the shoulder. Patient will benefit from further skilled therapy to return to prior level of function.     Rehab Potential  Good    Clinical Impairments Affecting Rehab Potential  (-) hx of MS; (+) good restriction    PT Frequency  2x / week    PT Duration  6 weeks    PT Treatment/Interventions  Moist Heat;Iontophoresis 4mg /ml Dexamethasone;Electrical Stimulation;Aquatic  Therapy;Cryotherapy;Therapeutic activities;Neuromuscular re-education;Therapeutic exercise;Patient/family education;Dry needling;Manual techniques;Passive range of motion;Balance training;Functional mobility training;Gait training;Stair training;Joint Manipulations;Taping    PT Next Visit Plan  address shoulder limitations, screen balance    PT Home Exercise Plan  See education    Consulted and Agree with Plan of Care  Patient       Patient will benefit from skilled therapeutic intervention in order to improve the following deficits and impairments:  Pain, Decreased coordination, Decreased endurance, Decreased range of motion, Decreased strength, Hypomobility, Impaired perceived functional ability, Increased fascial restricitons, Increased muscle spasms, Difficulty walking  Visit Diagnosis: Stiffness of right elbow, not elsewhere classified  Pain in right elbow     Problem List There are no active problems to display for this patient.   [name], PT DPT 01/30/2018, 5:19 PM  [facility] [address] [place], [address] Phone: [phone]   Fax:  [phone]  Name: Michelle Ruiz MRN: [id number] Date of Birth: 01/13/1971

## 2018-02-04 ENCOUNTER — Ambulatory Visit: Payer: BLUE CROSS/BLUE SHIELD

## 2018-02-04 DIAGNOSIS — M25521 Pain in right elbow: Secondary | ICD-10-CM | POA: Diagnosis not present

## 2018-02-04 DIAGNOSIS — M25621 Stiffness of right elbow, not elsewhere classified: Secondary | ICD-10-CM

## 2018-02-04 DIAGNOSIS — Z9181 History of falling: Secondary | ICD-10-CM | POA: Diagnosis not present

## 2018-02-04 DIAGNOSIS — R262 Difficulty in walking, not elsewhere classified: Secondary | ICD-10-CM | POA: Diagnosis not present

## 2018-02-04 NOTE — Therapy (Signed)
[facility] [address] [place], [address] Phone: [phone]   Fax:  [phone]  Physical Therapy Treatment/ Discharge Summary  Patient Details  Name: Michelle Ruiz MRN: [id number] Date of Birth: [birth year] Referring Provider (PT): Rachelle Hora  Patient has either met or partially met all long term goals and is to be discharged from PT from elbow dysfunction  Encounter Date: 02/04/2018  PT End of Session - 02/04/18 1843    Visit Number  7    Number of Visits  13    Date for PT Re-Evaluation  02/26/18    Authorization Type  BCBS    PT Start Time  1800    PT Stop Time  1845    PT Time Calculation (min)  45 min    Activity Tolerance  Patient tolerated treatment well    Behavior During Therapy  [facility] for tasks assessed/performed       Past Medical History:  Diagnosis Date  . Abnormal Pap smear of cervix   . Anxiety   . Kidney stones   . MS (multiple sclerosis) ([place])   . UTI (urinary tract infection)     Past Surgical History:  Procedure Laterality Date  . BREAST BIOPSY Right 2010   stereotactic biopsy/ neg/ dr.byrnett  . BREAST BIOPSY Left 2012   neg/dr. brynett  . CESAREAN SECTION  2008   RPH    There were no vitals filed for this visit.  Subjective Assessment - 02/04/18 1842    Subjective  Patient reports her shoulder and elbow has greatly improved and is prepared to be dishcarged from her elbow pain and start addressing more of her balance concerns next visit.     Pertinent History  Patient reports history of falls x3 in the past 6 months secondary to drop foot on the L LE. PMH: Multiple sclerosis; elbow fracture of the R UE    Limitations  Lifting    Currently in Pain?  No/denies    Pain Onset  More than a month ago       TREATMENT    Therapeutic Exercise Shoulder Forearm supination/pronation - x30 Shoulder rows in standing - x 30 GTB Behind the back shoulder IR - x 30 db  3# Tricep push down GTB - x 30 Bicep push down YTB - x30  Shoulder IR / ER with YRB - x 20     Patient demonstrates increased fatigue at the end of the session    PT Education - 02/04/18 [place]    Education Details  form/technique with exercise    Person(s) Educated  Patient    Methods  Explanation;Demonstration    Comprehension  Verbalized understanding;Returned demonstration          PT Long Term Goals - 02/04/18 1844      PT LONG TERM GOAL #1   Title  Patient will be independent with HEP to continue benefits of therapy after discharge.    Baseline  dependent with form/technique; 11/11/2019Independent with Elbow/shoulder exercises    Time  6    Period  Weeks    Status  Achieved      PT LONG TERM GOAL #2   Title  Patient will have a worst elbow pain of 0/10 with all movements to indicate improvement with performing typing actions    Baseline  worst pain: 8/10; 02/04/2018: 2/10    Time  6    Period  Weeks    Status  Partially Met      PT LONG TERM GOAL #3   Title  Patient will be able to donn/doff her bra without increased difficulties or pain to better be able to self-dress without assistance.    Baseline  Increased pain with peformance; 02/04/2018: Able to do without difficulty    Time  6    Period  Weeks    Status  Achieved            Plan - 02/04/18 1843    Clinical Impression Statement  Patient has either met or partially met all long term goals, she has demonstrated significant improvement with ability to perform all elbow movements without increase in pain and is able to donn/doff her bra strap without pain or difficulty. Patient is to be discharged from elbow physical therapy and to start balancing and LE strength based therapy next visit.     Rehab Potential  Good    Clinical Impairments Affecting Rehab Potential  (-) hx of MS; (+) good restriction    PT Frequency  2x / week    PT Duration  6 weeks    PT Treatment/Interventions  Moist Heat;Iontophoresis  '4mg'$ /ml Dexamethasone;Electrical Stimulation;Aquatic Therapy;Cryotherapy;Therapeutic activities;Neuromuscular re-education;Therapeutic exercise;Patient/family education;Dry needling;Manual techniques;Passive range of motion;Balance training;Functional mobility training;Gait training;Stair training;Joint Manipulations;Taping    PT Next Visit Plan  address shoulder limitations, screen balance    PT Home Exercise Plan  See education    Consulted and Agree with Plan of Care  Patient       Patient will benefit from skilled therapeutic intervention in order to improve the following deficits and impairments:  Pain, Decreased coordination, Decreased endurance, Decreased range of motion, Decreased strength, Hypomobility, Impaired perceived functional ability, Increased fascial restricitons, Increased muscle spasms, Difficulty walking  Visit Diagnosis: Pain in right elbow  Stiffness of right elbow, not elsewhere classified     Problem List There are no active problems to display for this patient.   [name], PT DPT 02/04/2018, 6:46 PM  [facility] [address], [place], [address] Phone: [phone]   Fax:  [phone]  Name: Michelle Ruiz MRN: [id number] Date of Birth: 08-17-1970

## 2018-02-06 ENCOUNTER — Ambulatory Visit: Payer: BLUE CROSS/BLUE SHIELD

## 2018-02-06 DIAGNOSIS — M25621 Stiffness of right elbow, not elsewhere classified: Secondary | ICD-10-CM | POA: Diagnosis not present

## 2018-02-06 DIAGNOSIS — R262 Difficulty in walking, not elsewhere classified: Secondary | ICD-10-CM | POA: Diagnosis not present

## 2018-02-06 DIAGNOSIS — Z9181 History of falling: Secondary | ICD-10-CM | POA: Diagnosis not present

## 2018-02-06 DIAGNOSIS — M25521 Pain in right elbow: Secondary | ICD-10-CM | POA: Diagnosis not present

## 2018-02-07 NOTE — Therapy (Signed)
[facility] [address], [place], [address] Phone: [phone]   Fax:  [phone]  Physical Therapy Evaluation  Patient Details  Name: Michelle Ruiz MRN: [id number] Date of Birth: 1970-04-02 Referring Provider (PT): Darrin Luis MD   Encounter Date: 02/06/2018  PT End of Session - 02/07/18 0824    Visit Number  1    Number of Visits  13    Date for PT Re-Evaluation  03/20/18    Authorization Type  BCBS    PT Start Time  1800    PT Stop Time  1855    PT Time Calculation (min)  55 min    Activity Tolerance  Patient tolerated treatment well    Behavior During Therapy  [facility] for tasks assessed/performed       Past Medical History:  Diagnosis Date  . Abnormal Pap smear of cervix   . Anxiety   . Kidney stones   . MS (multiple sclerosis) ([place])   . UTI (urinary tract infection)     Past Surgical History:  Procedure Laterality Date  . BREAST BIOPSY Right 2010   stereotactic biopsy/ neg/ dr.byrnett  . BREAST BIOPSY Left 2012   neg/dr. brynett  . CESAREAN SECTION  2008   [facility]    There were no vitals filed for this visit.   Subjective Assessment - 02/06/18 1804    Subjective  Patient reports increased difficulty with walking and performing prolonged standing (more than 20 min). Patient states her difficulty from walking stems from her inability to lift her foot when ambulating and states most often when she falls, it is because she trips over a crack on the road/sidewalk. Patient states she must stand with a wide BOS to balance and shift her weight laterally to maintain stability. Patient states increased balance difficulties with walking and report she was advised to use a gait assistive device for her walking with activating the doriflexors of the foot. Patient states she has no pain and repots overall LE weakness on the L side versus the R secondary to her MS. Patient reports decreased sensationon the L  side versus the R side.      Pertinent History  Patient reports history of falls x3 in the past 6 months secondary to drop foot on the L LE. PMH: Multiple sclerosis; elbow fracture of the R UE    Limitations  Standing;Walking    How long can you stand comfortably?  20 min    How long can you walk comfortably?  1 miles     Patient Stated Goals  To be more balanced and core strengthening     Currently in Pain?  No/denies    Pain Onset  --         [facility] PT Assessment - 02/07/18 0001      Assessment   Medical Diagnosis  MS    Referring Provider (PT)  Darrin Luis MD    Onset Date/Surgical Date  03/27/13    Hand Dominance  Right    Next MD Visit  unknown    Prior Therapy  yes- balance & elbow fx      Balance Screen   Has the patient fallen in the past 6 months  Yes    How many times?  3    Has the patient had a decrease in activity level because of a fear of falling?   Yes    Is the patient reluctant to leave  their home because of a fear of falling?   No      Home Film/video editor residence    Living Arrangements  Spouse/significant other;Children      Prior Function   Level of Independence  Independent    Vocation  Full time employment    Vocation Requirements  typing, walking    Leisure  work, walking, stationary bike      Cognition   Overall Cognitive Status  Within Functional Limits for tasks assessed      Observation/Other Assessments   Observations  Increased forward head posture and forward rounded shoulders      Sensation   Light Touch  Impaired by gross assessment   Hypersensitivity on L LE     Posture/Postural Control   Posture Comments  Forward rounded shoulders, forward head posture       ROM / Strength   AROM / PROM / Strength  AROM;Strength      AROM   AROM Assessment Site  Ankle;Knee;Hip    Right/Left Hip  Right;Left    Right Hip Extension  10    Right Hip Flexion  120    Right Hip External Rotation   30    Right Hip  Internal Rotation   20    Right Hip ABduction  40    Right Hip ADduction  20    Left Hip Extension  10    Left Hip Flexion  120    Left Hip External Rotation   30    Left Hip Internal Rotation   20    Left Hip ABduction  30    Left Hip ADduction  20    Right/Left Knee  Right;Left    Right Knee Extension  0    Right Knee Flexion  110    Left Knee Extension  0    Left Knee Flexion  110    Right/Left Ankle  Right;Left    Right Ankle Dorsiflexion  0    Right Ankle Inversion  30    Right Ankle Eversion  15    Left Ankle Dorsiflexion  0    Left Ankle Inversion  30    Left Ankle Eversion  15      Strength   Strength Assessment Site  Hip;Knee;Ankle    Right/Left Hip  Right;Left    Right Hip Flexion  4/5    Right Hip Extension  4-/5    Right Hip External Rotation   4/5    Right Hip Internal Rotation  4-/5    Right Hip ABduction  4-/5    Right Hip ADduction  4/5    Left Hip Flexion  4/5    Left Hip Extension  4-/5    Left Hip External Rotation  4-/5    Left Hip Internal Rotation  4-/5    Left Hip ABduction  4-/5    Left Hip ADduction  4-/5    Right/Left Knee  Left;Right    Right Knee Flexion  4-/5    Right Knee Extension  5/5    Left Knee Flexion  4-/5    Left Knee Extension  5/5    Right/Left Ankle  Right;Left    Right Ankle Dorsiflexion  4/5    Right Ankle Plantar Flexion  4/5    Right Ankle Inversion  4+/5    Right Ankle Eversion  4+/5    Left Ankle Dorsiflexion  4-/5    Left Ankle Plantar Flexion  4-/5    Left Ankle Inversion  4/5    Left Ankle Eversion  4/5      Ambulation/Gait   Gait velocity  1.71m/s    Gait Comments  Decreased dorsiflexion on the L LE with amb, decreased speed, decreased push off on the R LE      High Level Balance   High Level Balance Comments  SLS EC: 2 sec; SLS EO: >10sec      Functional Gait  Assessment   Gait assessed   Yes    Gait Level Surface  Walks 20 ft in less than 5.5 sec, no assistive devices, good speed, no evidence for  imbalance, normal gait pattern, deviates no more than 6 in outside of the 12 in walkway width.    Change in Gait Speed  Able to change speed, demonstrates mild gait deviations, deviates 6-10 in outside of the 12 in walkway width, or no gait deviations, unable to achieve a major change in velocity, or uses a change in velocity, or uses an assistive device.    Gait with Horizontal Head Turns  Performs head turns smoothly with slight change in gait velocity (eg, minor disruption to smooth gait path), deviates 6-10 in outside 12 in walkway width, or uses an assistive device.    Gait with Vertical Head Turns  Performs task with slight change in gait velocity (eg, minor disruption to smooth gait path), deviates 6 - 10 in outside 12 in walkway width or uses assistive device    Gait and Pivot Turn  Pivot turns safely in greater than 3 sec and stops with no loss of balance, or pivot turns safely within 3 sec and stops with mild imbalance, requires small steps to catch balance.    Step Over Obstacle  Is able to step over 2 stacked shoe boxes taped together (9 in total height) without changing gait speed. No evidence of imbalance.    Gait with Narrow Base of Support  Ambulates 7-9 steps.    Gait with Eyes Closed  Walks 20 ft, slow speed, abnormal gait pattern, evidence for imbalance, deviates 10-15 in outside 12 in walkway width. Requires more than 9 sec to ambulate 20 ft.    Ambulating Backwards  Walks 20 ft, no assistive devices, good speed, no evidence for imbalance, normal gait    Steps  Alternating feet, must use rail.    Total Score  22        Objective measurements completed on examination: See above findings.   TREATMENT Therapeutic Exercise: SLS in stand -- 2 x 30 sec  Tandem Ambulation -- x 20  Ambulation with focus on improving dorsiflexion -- x 20    Patient demonstrates fatigue at the end of the session      PT Education - 02/07/18 0824    Education Details  POC; ambulation with heel  strike, standing for time on the L LE versus the R LE    Person(s) Educated  Patient    Methods  Explanation;Demonstration    Comprehension  Verbalized understanding;Returned demonstration          PT Long Term Goals - 02/07/18 7591      PT LONG TERM GOAL #1   Title  Patient will be independent with balance HEP to continue benefits of therapy after discharge.    Baseline  dependent with form/technique for balance exercise    Time  6    Period  Weeks    Status  New  Target Date  03/20/18      PT LONG TERM GOAL #2   Title  Patient will improve FGA by 4 points to indicate significant improvement with balancing while ambulating and decrease of fall risk    Time  6    Period  Weeks    Status  New    Target Date  03/20/18      PT LONG TERM GOAL #3   Title  Patient will be able to perform prolonged single leg stance >10sec with eyes closed to indicate improvement with static balance and decrease fall risk    Baseline  2 sec in standing EC    Time  6    Period  Weeks    Status  New    Target Date  03/20/18             Plan - 02/07/18 0825    Clinical Impression Statement  Patient is a 47 yo right hand dominant female presenting with increased weakness and balance difficulties secondary to MS and weakness from general inactivity. Patient demonstrates poor strength along her hips B and demonstrates poor dynamic and static balance as indicated by decreased FGA and single leg stance scores. Patient will benefit from further skilled therapy to return to prior level of function.     History and Personal Factors relevant to plan of care:  Hx of MS; frequent falls; previous elbow fx    Clinical Presentation  Stable    Clinical Presentation due to:  balance not improving    Clinical Decision Making  Moderate    Rehab Potential  Good    Clinical Impairments Affecting Rehab Potential  (-) hx of MS; (+) good motivation    PT Frequency  2x / week    PT Duration  6 weeks    PT  Treatment/Interventions  Moist Heat;Iontophoresis 4mg /ml Dexamethasone;Electrical Stimulation;Aquatic Therapy;Cryotherapy;Therapeutic activities;Neuromuscular re-education;Therapeutic exercise;Patient/family education;Dry needling;Manual techniques;Passive range of motion;Balance training;Functional mobility training;Gait training;Stair training;Joint Manipulations    PT Next Visit Plan  Continue addressing balance    PT Home Exercise Plan  See education    Consulted and Agree with Plan of Care  Patient       Patient will benefit from skilled therapeutic intervention in order to improve the following deficits and impairments:  Pain, Decreased coordination, Impaired perceived functional ability, Increased fascial restricitons, Increased muscle spasms, Difficulty walking, Abnormal gait, Decreased balance, Decreased mobility, Impaired sensation  Visit Diagnosis: Stiffness of right elbow, not elsewhere classified  Pain in right elbow     Problem List There are no active problems to display for this patient.   [name], PT DPT 02/07/2018, 8:44 AM  [facility] [address] [place], [address] Phone: [phone]   Fax:  [phone]  Name: LOVELY KERINS MRN: [id number] Date of Birth: 08-08-1970

## 2018-02-11 ENCOUNTER — Ambulatory Visit: Payer: BLUE CROSS/BLUE SHIELD

## 2018-02-11 DIAGNOSIS — R262 Difficulty in walking, not elsewhere classified: Secondary | ICD-10-CM

## 2018-02-11 DIAGNOSIS — Z9181 History of falling: Secondary | ICD-10-CM | POA: Diagnosis not present

## 2018-02-11 DIAGNOSIS — M25621 Stiffness of right elbow, not elsewhere classified: Secondary | ICD-10-CM | POA: Diagnosis not present

## 2018-02-11 DIAGNOSIS — M25521 Pain in right elbow: Secondary | ICD-10-CM | POA: Diagnosis not present

## 2018-02-11 NOTE — Therapy (Signed)
[facility] [address], [place], [address] Phone: [phone]   Fax:  [phone]  Physical Therapy Treatment  Patient Details  Name: Michelle Ruiz MRN: [id number] Date of Birth: 1970-07-14 Referring Provider (PT): Darrin Luis MD   Encounter Date: 02/11/2018  PT End of Session - 02/11/18 1757    Visit Number  2    Number of Visits  13    Date for PT Re-Evaluation  03/20/18    Authorization Type  BCBS    PT Start Time  9622    PT Stop Time  1842    PT Time Calculation (min)  44 min    Activity Tolerance  Patient tolerated treatment well    Behavior During Therapy  [facility] for tasks assessed/performed       Past Medical History:  Diagnosis Date  . Abnormal Pap smear of cervix   . Anxiety   . Kidney stones   . MS (multiple sclerosis) ([place])   . UTI (urinary tract infection)     Past Surgical History:  Procedure Laterality Date  . BREAST BIOPSY Right 2010   stereotactic biopsy/ neg/ dr.byrnett  . BREAST BIOPSY Left 2012   neg/dr. brynett  . CESAREAN SECTION  2008   [facility]    There were no vitals filed for this visit.  Subjective Assessment - 02/11/18 1801    Subjective  Patient stated that she has been trying to be more aware of walking heel to toe when she remembers.     Currently in Pain?  No/denies    Pain Onset  More than a month ago       TREATMENT:   Gait training Tandem Ambulation x12ft with CGA/close supervision Ambulation with focus on improving dorsiflexion -- 115ft x2 rounds during session pregait activities, rocking heel toe in step forward position 2 x5mins with ambulation immediately following  Neuro-reedu SLS in stand -- 2 x 30 sec  Tandem ambulation on blue foam x19ft 3 rounds Step forward over hurdles, to touch oppposite heel down 2x20 bilaterally   Therapeutic exercises Seated DF x30 on LLE Standing DF/PF x20  With UE support ankle rocker x30 with UE support    Patient fatigued with no complaints of pain at end of session.     PT Education - 02/11/18 1757    Education Details  therex technique/form    Person(s) Educated  Patient    Methods  Demonstration;Explanation    Comprehension  Verbalized understanding;Returned demonstration;Verbal cues required          PT Long Term Goals - 02/07/18 0832      PT LONG TERM GOAL #1   Title  Patient will be independent with balance HEP to continue benefits of therapy after discharge.    Baseline  dependent with form/technique for balance exercise    Time  6    Period  Weeks    Status  New    Target Date  03/20/18      PT LONG TERM GOAL #2   Title  Patient will improve FGA by 4 points to indicate significant improvement with balancing while ambulating and decrease of fall risk    Time  6    Period  Weeks    Status  New    Target Date  03/20/18      PT LONG TERM GOAL #3   Title  Patient will be able to perform prolonged single leg stance >10sec with eyes  closed to indicate improvement with static balance and decrease fall risk    Baseline  2 sec in standing EC    Time  6    Period  Weeks    Status  New    Target Date  03/20/18            Plan - 02/11/18 1827    Clinical Impression Statement  Patient most challenged by active DF of L ankle, as well as single leg stance. The patient would benefit from further gait training as well as hip and ankle strengthening. May consider L ankle mobilizations to maxmize ankle mobility.    Rehab Potential  Good    Clinical Impairments Affecting Rehab Potential  (-) hx of MS; (+) good motivation    PT Frequency  2x / week    PT Duration  6 weeks    PT Treatment/Interventions  Moist Heat;Iontophoresis 4mg /ml Dexamethasone;Electrical Stimulation;Aquatic Therapy;Cryotherapy;Therapeutic activities;Neuromuscular re-education;Therapeutic exercise;Patient/family education;Dry needling;Manual techniques;Passive range of motion;Balance training;Functional  mobility training;Gait training;Stair training;Joint Manipulations    PT Next Visit Plan  Continue addressing balance    PT Home Exercise Plan  See education    Consulted and Agree with Plan of Care  Patient       Patient will benefit from skilled therapeutic intervention in order to improve the following deficits and impairments:  Pain, Decreased coordination, Impaired perceived functional ability, Increased fascial restricitons, Increased muscle spasms, Difficulty walking, Abnormal gait, Decreased balance, Decreased mobility, Impaired sensation  Visit Diagnosis: Difficulty in walking, not elsewhere classified  History of falling     Problem List There are no active problems to display for this patient.  Lieutenant Diego PT, DPT 6:41 PM,02/11/18 [facility] [address], [place], [address] Phone: [phone]   Fax:  [phone]  Name: Michelle Ruiz MRN: [id number] Date of Birth: 10-02-1970

## 2018-02-13 ENCOUNTER — Ambulatory Visit: Payer: BLUE CROSS/BLUE SHIELD

## 2018-02-13 DIAGNOSIS — Z9181 History of falling: Secondary | ICD-10-CM | POA: Diagnosis not present

## 2018-02-13 DIAGNOSIS — R262 Difficulty in walking, not elsewhere classified: Secondary | ICD-10-CM

## 2018-02-13 DIAGNOSIS — M25521 Pain in right elbow: Secondary | ICD-10-CM | POA: Diagnosis not present

## 2018-02-13 DIAGNOSIS — M25621 Stiffness of right elbow, not elsewhere classified: Secondary | ICD-10-CM | POA: Diagnosis not present

## 2018-02-13 NOTE — Therapy (Signed)
[facility] [address], [place], [address] Phone: [phone]   Fax:  [phone]  Physical Therapy Treatment  Patient Details  Name: Michelle Ruiz MRN: [id number] Date of Birth: 1971-01-29 Referring Provider (PT): Darrin Luis MD   Encounter Date: 02/13/2018  PT End of Session - 02/13/18 1852    Visit Number  3    Number of Visits  13    Date for PT Re-Evaluation  03/20/18    Authorization Type  BCBS    PT Start Time  1800    PT Stop Time  1845    PT Time Calculation (min)  45 min    Activity Tolerance  Patient tolerated treatment well    Behavior During Therapy  [facility] for tasks assessed/performed       Past Medical History:  Diagnosis Date  . Abnormal Pap smear of cervix   . Anxiety   . Kidney stones   . MS (multiple sclerosis) ([place])   . UTI (urinary tract infection)     Past Surgical History:  Procedure Laterality Date  . BREAST BIOPSY Right 2010   stereotactic biopsy/ neg/ dr.byrnett  . BREAST BIOPSY Left 2012   neg/dr. brynett  . CESAREAN SECTION  2008   [facility]    There were no vitals filed for this visit.  Subjective Assessment - 02/13/18 1829    Subjective  Patient reports she was sore after the previous session and states she is not wearing the dorsiflexion brace to walk. Patient reports she twisted her ankle yesterday.     Pertinent History  Patient reports history of falls x3 in the past 6 months secondary to drop foot on the L LE. PMH: Multiple sclerosis; elbow fracture of the R UE    Limitations  Standing;Walking    How long can you stand comfortably?  20 min    How long can you walk comfortably?  1 miles     Patient Stated Goals  To be more balanced and core strengthening     Currently in Pain?  No/denies    Pain Onset  More than a month ago       TREATMENT:    Therapeutic Exercise Ambulation over hurdles (6) - x 10 without UE support  SLS in standing -- 4 x 30 sec   Standing DF/PF x20  With UE support Ankle rocker x30 with UE support Duck walking - 2 x 71ft Ambulation with focus on performing heel - toe - 352ft  Heel raises unilaterally - x 20  Plantarflexion stretch in sitting - x 20   Manual therapy: with patient in sitting STM performed to the plantar flexors along the affected side to improve AROM and decrease pain TC A-P to improve AROM grade III - 4 x 30sec   Patient fatigued with no complaints of pain at end of session.    PT Education - 02/13/18 1844    Education Details  form/technique with exercise    Person(s) Educated  Patient    Methods  Explanation;Demonstration    Comprehension  Verbalized understanding;Returned demonstration          PT Long Term Goals - 02/07/18 9233      PT LONG TERM GOAL #1   Title  Patient will be independent with balance HEP to continue benefits of therapy after discharge.    Baseline  dependent with form/technique for balance exercise    Time  6    Period  Weeks    Status  New    Target Date  03/20/18      PT LONG TERM GOAL #2   Title  Patient will improve FGA by 4 points to indicate significant improvement with balancing while ambulating and decrease of fall risk    Time  6    Period  Weeks    Status  New    Target Date  03/20/18      PT LONG TERM GOAL #3   Title  Patient will be able to perform prolonged single leg stance >10sec with eyes closed to indicate improvement with static balance and decrease fall risk    Baseline  2 sec in standing EC    Time  6    Period  Weeks    Status  New    Target Date  03/20/18            Plan - 02/13/18 1853    Clinical Impression Statement  Patient demonstrates increased difficulty performing DF most notably after performing walking exercises indicating increased fatigue and decreased AROM. Patient continues to demonstrate increased overall fatigue with exercises and will benefit from further skilled therapy to return to prior level of  function.     Rehab Potential  Good    Clinical Impairments Affecting Rehab Potential  (-) hx of MS; (+) good motivation    PT Frequency  2x / week    PT Duration  6 weeks    PT Treatment/Interventions  Moist Heat;Iontophoresis 4mg /ml Dexamethasone;Electrical Stimulation;Aquatic Therapy;Cryotherapy;Therapeutic activities;Neuromuscular re-education;Therapeutic exercise;Patient/family education;Dry needling;Manual techniques;Passive range of motion;Balance training;Functional mobility training;Gait training;Stair training;Joint Manipulations    PT Next Visit Plan  Continue addressing balance    PT Home Exercise Plan  See education    Consulted and Agree with Plan of Care  Patient       Patient will benefit from skilled therapeutic intervention in order to improve the following deficits and impairments:  Pain, Decreased coordination, Impaired perceived functional ability, Increased fascial restricitons, Increased muscle spasms, Difficulty walking, Abnormal gait, Decreased balance, Decreased mobility, Impaired sensation  Visit Diagnosis: Difficulty in walking, not elsewhere classified  History of falling     Problem List There are no active problems to display for this patient.   [name], PT DPT 02/13/2018, 6:56 PM  [facility] [address], [place], [address] Phone: [phone]   Fax:  [phone]  Name: NASHALIE SALLIS MRN: [id number] Date of Birth: 1971/01/30

## 2018-02-18 ENCOUNTER — Ambulatory Visit: Payer: BLUE CROSS/BLUE SHIELD

## 2018-02-18 DIAGNOSIS — Z9181 History of falling: Secondary | ICD-10-CM | POA: Diagnosis not present

## 2018-02-18 DIAGNOSIS — M25521 Pain in right elbow: Secondary | ICD-10-CM | POA: Diagnosis not present

## 2018-02-18 DIAGNOSIS — M25621 Stiffness of right elbow, not elsewhere classified: Secondary | ICD-10-CM | POA: Diagnosis not present

## 2018-02-18 DIAGNOSIS — R262 Difficulty in walking, not elsewhere classified: Secondary | ICD-10-CM

## 2018-02-19 NOTE — Therapy (Signed)
[facility] [address], [place], [address] Phone: [phone]   Fax:  [phone]  Physical Therapy Treatment  Patient Details  Name: Michelle Ruiz MRN: [id number] Date of Birth: November 22, 1970 Referring Provider (PT): Darrin Luis MD   Encounter Date: 02/18/2018  PT End of Session - 02/18/18 1831    Visit Number  4    Number of Visits  13    Date for PT Re-Evaluation  03/20/18    Authorization Type  BCBS    PT Start Time  1800    PT Stop Time  1845    PT Time Calculation (min)  45 min    Activity Tolerance  Patient tolerated treatment well    Behavior During Therapy  [facility] for tasks assessed/performed       Past Medical History:  Diagnosis Date  . Abnormal Pap smear of cervix   . Anxiety   . Kidney stones   . MS (multiple sclerosis) ([place])   . UTI (urinary tract infection)     Past Surgical History:  Procedure Laterality Date  . BREAST BIOPSY Right 2010   stereotactic biopsy/ neg/ dr.byrnett  . BREAST BIOPSY Left 2012   neg/dr. brynett  . CESAREAN SECTION  2008   R[facility]    There were no vitals filed for this visit.  Subjective Assessment - 02/18/18 1824    Subjective  Patient reports her foot has hurting but has been recently feeling better. Patient states she has not been wearing her dorsiflexion brace to improve her strengthening.     Pertinent History  Patient reports history of falls x3 in the past 6 months secondary to drop foot on the L LE. PMH: Multiple sclerosis; elbow fracture of the R UE    Limitations  Standing;Walking    How long can you stand comfortably?  20 min    How long can you walk comfortably?  1 miles     Patient Stated Goals  To be more balanced and core strengthening     Currently in Pain?  No/denies    Pain Onset  More than a month ago           TREATMENT:    Therapeutic Exercise Ambulation over hurdles (6) - x 10 without UE support  Side stepping over hurdles (6)  - x 4 without UE support  Squat pick ups of cones and balance stones from airex pad - 2 x 10  Side stepping across balance stones - x 5 B (7 balance stones) Side stepping with GTB around knees - x 3 B with 75ft Cone taps from airex pad - x 15 B  Standing gastroc stretch - x 2 x 10sec holds B    Patient fatigued with no complaints of pain at end of session.   PT Education - 02/18/18 [place]    Education Details  form/technique with exercise    Person(s) Educated  Patient    Methods  Explanation;Demonstration    Comprehension  Verbalized understanding;Returned demonstration          PT Long Term Goals - 02/07/18 0832      PT LONG TERM GOAL #1   Title  Patient will be independent with balance HEP to continue benefits of therapy after discharge.    Baseline  dependent with form/technique for balance exercise    Time  6    Period  Weeks    Status  New    Target Date  03/20/18      PT LONG TERM GOAL #2   Title  Patient will improve FGA by 4 points to indicate significant improvement with balancing while ambulating and decrease of fall risk    Time  6    Period  Weeks    Status  New    Target Date  03/20/18      PT LONG TERM GOAL #3   Title  Patient will be able to perform prolonged single leg stance >10sec with eyes closed to indicate improvement with static balance and decrease fall risk    Baseline  2 sec in standing EC    Time  6    Period  Weeks    Status  New    Target Date  03/20/18            Plan - 02/18/18 1832    Clinical Impression Statement  Patient demonstrates improvement with exercises however continues to increased faitgue with exercises requiring semi-frequent breaks. Patient edemosntrates improvement with dynamic balance requiring less self-righting stepping techniques to maintain balance. Patient will benefit from furhter skilled therapy to return to prior level of function.     Rehab Potential  Good    Clinical Impairments Affecting Rehab Potential   (-) hx of MS; (+) good motivation    PT Frequency  2x / week    PT Duration  6 weeks    PT Treatment/Interventions  Moist Heat;Iontophoresis 4mg /ml Dexamethasone;Electrical Stimulation;Aquatic Therapy;Cryotherapy;Therapeutic activities;Neuromuscular re-education;Therapeutic exercise;Patient/family education;Dry needling;Manual techniques;Passive range of motion;Balance training;Functional mobility training;Gait training;Stair training;Joint Manipulations    PT Next Visit Plan  Continue addressing balance    PT Home Exercise Plan  See education    Consulted and Agree with Plan of Care  Patient       Patient will benefit from skilled therapeutic intervention in order to improve the following deficits and impairments:  Pain, Decreased coordination, Impaired perceived functional ability, Increased fascial restricitons, Increased muscle spasms, Difficulty walking, Abnormal gait, Decreased balance, Decreased mobility, Impaired sensation  Visit Diagnosis: Difficulty in walking, not elsewhere classified  History of falling     Problem List There are no active problems to display for this patient.   [name], PT DPT 02/19/2018, 9:37 AM  [facility] [address] [place], [address] Phone: [phone]   Fax:  [phone]  Name: Michelle Ruiz MRN: [id number] Date of Birth: 02/26/1971

## 2018-02-20 ENCOUNTER — Ambulatory Visit: Payer: BLUE CROSS/BLUE SHIELD

## 2018-02-20 DIAGNOSIS — Z9181 History of falling: Secondary | ICD-10-CM

## 2018-02-20 DIAGNOSIS — M25521 Pain in right elbow: Secondary | ICD-10-CM | POA: Diagnosis not present

## 2018-02-20 DIAGNOSIS — R262 Difficulty in walking, not elsewhere classified: Secondary | ICD-10-CM | POA: Diagnosis not present

## 2018-02-20 DIAGNOSIS — M25621 Stiffness of right elbow, not elsewhere classified: Secondary | ICD-10-CM | POA: Diagnosis not present

## 2018-02-20 NOTE — Therapy (Signed)
[facility] [address], [place], [address] Phone: [phone]   Fax:  [phone]  Physical Therapy Treatment  Patient Details  Name: Michelle Ruiz MRN: [id number] Date of Birth: 1970/04/14 Referring Provider (PT): Darrin Luis MD   Encounter Date: 02/20/2018  PT End of Session - 02/20/18 1828    Visit Number  5    Number of Visits  13    Date for PT Re-Evaluation  03/20/18    Authorization Type  BCBS    PT Start Time  0973    PT Stop Time  1840    PT Time Calculation (min)  45 min    Activity Tolerance  Patient tolerated treatment well    Behavior During Therapy  [facility] for tasks assessed/performed       Past Medical History:  Diagnosis Date  . Abnormal Pap smear of cervix   . Anxiety   . Kidney stones   . MS (multiple sclerosis) ([place])   . UTI (urinary tract infection)     Past Surgical History:  Procedure Laterality Date  . BREAST BIOPSY Right 2010   stereotactic biopsy/ neg/ dr.byrnett  . BREAST BIOPSY Left 2012   neg/dr. brynett  . CESAREAN SECTION  2008   [facility]    There were no vitals filed for this visit.  Subjective Assessment - 02/20/18 1759    Subjective  Patient reports increased fatigue at the previous sessions. Patient states no major changes otherwise.     Pertinent History  Patient reports history of falls x3 in the past 6 months secondary to drop foot on the L LE. PMH: Multiple sclerosis; elbow fracture of the R UE    Limitations  Standing;Walking    How long can you stand comfortably?  20 min    How long can you walk comfortably?  1 miles     Patient Stated Goals  To be more balanced and core strengthening     Currently in Pain?  No/denies    Pain Onset  More than a month ago       TREATMENT:    Therapeutic Exercise Standing gastroc stretch - x 2 x 10sec holds B  Backwards ambulation with GTB around knees - x 20  Sit to stands with GTB around knees - 2 x 15 Side  stepping with GTB around knees - x 3 B with 67ft Backwards ambulation over hurdles (6) - x 4 Ambulation over hurdles (6) - x 10 without UE support  Side stepping over hurdles (6) - x 4 without UE support  Side stepping across airex beam - x 4 B  Feet together with ball toss on airex - x 10   Patient fatigued with no complaints of pain at end of session.   PT Education - 02/20/18 1824    Education Details  form/technique with exercise    Person(s) Educated  Patient    Methods  Explanation;Demonstration    Comprehension  Verbalized understanding;Returned demonstration          PT Long Term Goals - 02/07/18 0832      PT LONG TERM GOAL #1   Title  Patient will be independent with balance HEP to continue benefits of therapy after discharge.    Baseline  dependent with form/technique for balance exercise    Time  6    Period  Weeks    Status  New    Target Date  03/20/18  PT LONG TERM GOAL #2   Title  Patient will improve FGA by 4 points to indicate significant improvement with balancing while ambulating and decrease of fall risk    Time  6    Period  Weeks    Status  New    Target Date  03/20/18      PT LONG TERM GOAL #3   Title  Patient will be able to perform prolonged single leg stance >10sec with eyes closed to indicate improvement with static balance and decrease fall risk    Baseline  2 sec in standing EC    Time  6    Period  Weeks    Status  New    Target Date  03/20/18            Plan - 02/20/18 1830    Clinical Impression Statement  Patient demonstrates improvement with ability to perform greater amount of exercises in standing with less sitting rest breaks compared to the previous session. Patient continues to demonstrate poor tandem stance and poor balance with lesser BOS. Patient will benefit from furhter skilled therapy to return to prior level of function.     Rehab Potential  Good    Clinical Impairments Affecting Rehab Potential  (-) hx of MS;  (+) good motivation    PT Frequency  2x / week    PT Duration  6 weeks    PT Treatment/Interventions  Moist Heat;Iontophoresis 4mg /ml Dexamethasone;Electrical Stimulation;Aquatic Therapy;Cryotherapy;Therapeutic activities;Neuromuscular re-education;Therapeutic exercise;Patient/family education;Dry needling;Manual techniques;Passive range of motion;Balance training;Functional mobility training;Gait training;Stair training;Joint Manipulations    PT Next Visit Plan  Continue addressing balance    PT Home Exercise Plan  See education    Consulted and Agree with Plan of Care  Patient       Patient will benefit from skilled therapeutic intervention in order to improve the following deficits and impairments:  Pain, Decreased coordination, Impaired perceived functional ability, Increased fascial restricitons, Increased muscle spasms, Difficulty walking, Abnormal gait, Decreased balance, Decreased mobility, Impaired sensation  Visit Diagnosis: Difficulty in walking, not elsewhere classified  History of falling     Problem List There are no active problems to display for this patient.   [name], PT DPT 02/20/2018, 6:43 PM  [facility] [address], [place], [address] Phone: [phone]   Fax:  [phone]  Name: Michelle Ruiz MRN: [id number] Date of Birth: 1970-08-23

## 2018-02-25 ENCOUNTER — Ambulatory Visit: Payer: BLUE CROSS/BLUE SHIELD

## 2018-02-25 DIAGNOSIS — F330 Major depressive disorder, recurrent, mild: Secondary | ICD-10-CM | POA: Diagnosis not present

## 2018-02-25 DIAGNOSIS — F3281 Premenstrual dysphoric disorder: Secondary | ICD-10-CM | POA: Diagnosis not present

## 2018-02-25 DIAGNOSIS — F411 Generalized anxiety disorder: Secondary | ICD-10-CM | POA: Diagnosis not present

## 2018-02-27 ENCOUNTER — Ambulatory Visit: Payer: BLUE CROSS/BLUE SHIELD | Attending: Neurology

## 2018-02-27 DIAGNOSIS — R262 Difficulty in walking, not elsewhere classified: Secondary | ICD-10-CM | POA: Diagnosis not present

## 2018-02-27 DIAGNOSIS — M25521 Pain in right elbow: Secondary | ICD-10-CM | POA: Diagnosis not present

## 2018-02-27 DIAGNOSIS — Z9181 History of falling: Secondary | ICD-10-CM | POA: Diagnosis not present

## 2018-02-27 NOTE — Therapy (Signed)
[facility] [address] [place], [address] Phone: [phone]   Fax:  [phone]  Physical Therapy Treatment  Patient Details  Name: Michelle Ruiz MRN: [id number] Date of Birth: 05-25-1970 Referring Provider (PT): Darrin Luis MD   Encounter Date: 02/27/2018  PT End of Session - 02/27/18 1830    Visit Number  6    Number of Visits  13    Date for PT Re-Evaluation  03/20/18    Authorization Type  BCBS    PT Start Time  1800    PT Stop Time  1845    PT Time Calculation (min)  45 min    Activity Tolerance  Patient tolerated treatment well    Behavior During Therapy  [facility] for tasks assessed/performed       Past Medical History:  Diagnosis Date  . Abnormal Pap smear of cervix   . Anxiety   . Kidney stones   . MS (multiple sclerosis) ([place])   . UTI (urinary tract infection)     Past Surgical History:  Procedure Laterality Date  . BREAST BIOPSY Right 2010   stereotactic biopsy/ neg/ dr.byrnett  . BREAST BIOPSY Left 2012   neg/dr. brynett  . CESAREAN SECTION  2008   RPH    There were no vitals filed for this visit.  Subjective Assessment - 02/27/18 1824    Subjective  Patient states no major changes since the previous session. States she has a small amount of shoulder pain.     Pertinent History  Patient reports history of falls x3 in the past 6 months secondary to drop foot on the L LE. PMH: Multiple sclerosis; elbow fracture of the R UE    Limitations  Standing;Walking    How long can you stand comfortably?  20 min    How long can you walk comfortably?  1 miles     Patient Stated Goals  To be more balanced and core strengthening     Currently in Pain?  No/denies    Pain Onset  More than a month ago       TREATMENT:    Therapeutic Exercise Side stepping over hurdles (6) - x 8 without UE support  Ambulation over hurdles (6) - x 10 without UE support  Backwards ambulation over hurdles (6) -  x 6 Hops with B UE support - x 20  Running man on airex pad - 2 x 15  Ball toss with standing on airex pad - x 20  Hip abduction on airex pad - x 20 with UE support Hip extension on airex pad - x 20    Patient fatigued with no complaints of pain at end of session.   PT Education - 02/27/18 1830    Education Details  form/technique with exercise    Person(s) Educated  Patient    Methods  Explanation;Demonstration    Comprehension  Verbalized understanding;Returned demonstration          PT Long Term Goals - 02/07/18 0832      PT LONG TERM GOAL #1   Title  Patient will be independent with balance HEP to continue benefits of therapy after discharge.    Baseline  dependent with form/technique for balance exercise    Time  6    Period  Weeks    Status  New    Target Date  03/20/18      PT LONG TERM GOAL #2   Title  Patient will improve FGA by 4 points to indicate significant improvement with balancing while ambulating and decrease of fall risk    Time  6    Period  Weeks    Status  New    Target Date  03/20/18      PT LONG TERM GOAL #3   Title  Patient will be able to perform prolonged single leg stance >10sec with eyes closed to indicate improvement with static balance and decrease fall risk    Baseline  2 sec in standing EC    Time  6    Period  Weeks    Status  New    Target Date  03/20/18            Plan - 02/27/18 1832    Clinical Impression Statement  Patient demonstrates improved ability to perform greater amount of standing exercises compared to the previous session indicating functional carryover between sessions. Although patient is improving, she continues to have decreased strength and patient will benefit from further skilled therapy to return to prior level of function.     Rehab Potential  Good    Clinical Impairments Affecting Rehab Potential  (-) hx of MS; (+) good motivation    PT Frequency  2x / week    PT Duration  6 weeks    PT  Treatment/Interventions  Moist Heat;Iontophoresis 4mg /ml Dexamethasone;Electrical Stimulation;Aquatic Therapy;Cryotherapy;Therapeutic activities;Neuromuscular re-education;Therapeutic exercise;Patient/family education;Dry needling;Manual techniques;Passive range of motion;Balance training;Functional mobility training;Gait training;Stair training;Joint Manipulations    PT Next Visit Plan  Continue addressing balance    PT Home Exercise Plan  See education    Consulted and Agree with Plan of Care  Patient       Patient will benefit from skilled therapeutic intervention in order to improve the following deficits and impairments:  Pain, Decreased coordination, Impaired perceived functional ability, Increased fascial restricitons, Increased muscle spasms, Difficulty walking, Abnormal gait, Decreased balance, Decreased mobility, Impaired sensation  Visit Diagnosis: Difficulty in walking, not elsewhere classified  History of falling  Pain in right elbow     Problem List There are no active problems to display for this patient.   [name], PT DPT 02/27/2018, 6:40 PM  [facility] [address] [place], [address] Phone: [phone]   Fax:  [phone]  Name: Michelle Ruiz MRN: [id number] Date of Birth: 1970/10/18

## 2018-03-04 ENCOUNTER — Ambulatory Visit: Payer: BLUE CROSS/BLUE SHIELD

## 2018-03-04 DIAGNOSIS — R262 Difficulty in walking, not elsewhere classified: Secondary | ICD-10-CM | POA: Diagnosis not present

## 2018-03-04 DIAGNOSIS — Z9181 History of falling: Secondary | ICD-10-CM

## 2018-03-04 DIAGNOSIS — M25521 Pain in right elbow: Secondary | ICD-10-CM | POA: Diagnosis not present

## 2018-03-04 NOTE — Therapy (Signed)
[facility] [address] [place], [address] Phone: [phone]   Fax:  [phone]  Physical Therapy Treatment  Patient Details  Name: Michelle Ruiz MRN: [id number] Date of Birth: 1971-01-19 Referring Provider (PT): Darrin Luis MD   Encounter Date: 03/04/2018  PT End of Session - 03/04/18 1859    Visit Number  7    Number of Visits  13    Date for PT Re-Evaluation  03/20/18    Authorization Type  BCBS    PT Start Time  1815    PT Stop Time  1900    PT Time Calculation (min)  45 min    Activity Tolerance  Patient tolerated treatment well    Behavior During Therapy  [facility] for tasks assessed/performed       Past Medical History:  Diagnosis Date  . Abnormal Pap smear of cervix   . Anxiety   . Kidney stones   . MS (multiple sclerosis) ([place])   . UTI (urinary tract infection)     Past Surgical History:  Procedure Laterality Date  . BREAST BIOPSY Right 2010   stereotactic biopsy/ neg/ dr.byrnett  . BREAST BIOPSY Left 2012   neg/dr. brynett  . CESAREAN SECTION  2008   [facility]    There were no vitals filed for this visit.  Subjective Assessment - 03/04/18 1856    Subjective  Patient states no major changes since the previous session but states no pain overall.     Pertinent History  Patient reports history of falls x3 in the past 6 months secondary to drop foot on the L LE. PMH: Multiple sclerosis; elbow fracture of the R UE    Limitations  Standing;Walking    How long can you stand comfortably?  20 min    How long can you walk comfortably?  1 miles     Patient Stated Goals  To be more balanced and core strengthening     Currently in Pain?  No/denies    Pain Onset  More than a month ago       TREATMENT:    Therapeutic Exercise Side stepping over hurdles (6) - x 8 without UE support with airex pads underneath Ambulation over hurdles (6) - x 10 without UE support with airex pads underneath Step ups  onto 8 " step - x 15 B LE standing on blue side of bosu - x 2 min Sit to stands with holding 10# weight - x10 Ball toss with standing on airex pad - x 20  Agility ladder hops forward - x 20  Hip hikes in standing off of 4" step - x15   Patient fatigued with no complaints of pain at end of session.  PT Education - 03/04/18 1859    Education Details  form/technique with exercise    Person(s) Educated  Patient    Methods  Explanation;Demonstration    Comprehension  Verbalized understanding;Returned demonstration          PT Long Term Goals - 02/07/18 0832      PT LONG TERM GOAL #1   Title  Patient will be independent with balance HEP to continue benefits of therapy after discharge.    Baseline  dependent with form/technique for balance exercise    Time  6    Period  Weeks    Status  New    Target Date  03/20/18      PT LONG TERM GOAL #2  Title  Patient will improve FGA by 4 points to indicate significant improvement with balancing while ambulating and decrease of fall risk    Time  6    Period  Weeks    Status  New    Target Date  03/20/18      PT LONG TERM GOAL #3   Title  Patient will be able to perform prolonged single leg stance >10sec with eyes closed to indicate improvement with static balance and decrease fall risk    Baseline  2 sec in standing EC    Time  6    Period  Weeks    Status  New    Target Date  03/20/18            Plan - 03/04/18 1900    Clinical Impression Statement  Patient demonstrates improvement overall most notably with hopping motions indicating improvement with coordination, power and strength. Although patient is improving, she continues to demonstrate difficulty with performing static and dynamic balance activites. Patient will benefit from further skilled therapy to return to prior level of function.     Rehab Potential  Good    Clinical Impairments Affecting Rehab Potential  (-) hx of MS; (+) good motivation    PT Frequency  2x /  week    PT Duration  6 weeks    PT Treatment/Interventions  Moist Heat;Iontophoresis 4mg /ml Dexamethasone;Electrical Stimulation;Aquatic Therapy;Cryotherapy;Therapeutic activities;Neuromuscular re-education;Therapeutic exercise;Patient/family education;Dry needling;Manual techniques;Passive range of motion;Balance training;Functional mobility training;Gait training;Stair training;Joint Manipulations    PT Next Visit Plan  Continue addressing balance    PT Home Exercise Plan  See education    Consulted and Agree with Plan of Care  Patient       Patient will benefit from skilled therapeutic intervention in order to improve the following deficits and impairments:  Pain, Decreased coordination, Impaired perceived functional ability, Increased fascial restricitons, Increased muscle spasms, Difficulty walking, Abnormal gait, Decreased balance, Decreased mobility, Impaired sensation  Visit Diagnosis: Difficulty in walking, not elsewhere classified  History of falling     Problem List There are no active problems to display for this patient.   [name], PT DPT 03/04/2018, 7:03 PM  [facility] [address] [place], [address] Phone: [phone]   Fax:  [phone]  Name: Michelle Ruiz MRN: [id number] Date of Birth: [birth year]

## 2018-03-06 ENCOUNTER — Ambulatory Visit: Payer: BLUE CROSS/BLUE SHIELD

## 2018-03-06 DIAGNOSIS — M25521 Pain in right elbow: Secondary | ICD-10-CM | POA: Diagnosis not present

## 2018-03-06 DIAGNOSIS — Z9181 History of falling: Secondary | ICD-10-CM

## 2018-03-06 DIAGNOSIS — R262 Difficulty in walking, not elsewhere classified: Secondary | ICD-10-CM | POA: Diagnosis not present

## 2018-03-07 NOTE — Therapy (Signed)
[facility] [address], [place], [address] Phone: [phone]   Fax:  [phone]  Physical Therapy Treatment  Patient Details  Name: Michelle Ruiz MRN: [id number] Date of Birth: September 06, 1970 Referring Provider (PT): Darrin Luis MD   Encounter Date: 03/06/2018  PT End of Session - 03/07/18 1238    Visit Number  8    Number of Visits  13    Date for PT Re-Evaluation  03/20/18    Authorization Type  BCBS    PT Start Time  1815    PT Stop Time  1900    PT Time Calculation (min)  45 min    Activity Tolerance  Patient tolerated treatment well    Behavior During Therapy  [facility] for tasks assessed/performed       Past Medical History:  Diagnosis Date  . Abnormal Pap smear of cervix   . Anxiety   . Kidney stones   . MS (multiple sclerosis) ([place])   . UTI (urinary tract infection)     Past Surgical History:  Procedure Laterality Date  . BREAST BIOPSY Right 2010   stereotactic biopsy/ neg/ dr.byrnett  . BREAST BIOPSY Left 2012   neg/dr. brynett  . CESAREAN SECTION  2008   RPH    There were no vitals filed for this visit.  Subjective Assessment - 03/07/18 1236    Subjective  Patient reports she has been performing her HEP and states she is improving overall.     Pertinent History  Patient reports history of falls x3 in the past 6 months secondary to drop foot on the L LE. PMH: Multiple sclerosis; elbow fracture of the R UE    Limitations  Standing;Walking    How long can you stand comfortably?  20 min    How long can you walk comfortably?  1 miles     Patient Stated Goals  To be more balanced and core strengthening     Currently in Pain?  No/denies    Pain Onset  More than a month ago        TREATMENT:    Therapeutic Exercise Agility ladder hops forward - x 10 Agility ladder hops forward double hops - x 6 Side stepping across airex beam - x10 without UE support B Cone taps from airex pad - x20  B Tandem ambulation across airex beam - x 10  Ball toss with standing on airex pad - x 20  B LE standing on blue side of bosu - x 2 min [place] with YTB - x x39ft   Patient fatigued with no complaints of pain at end of session.   PT Education - 03/07/18 1238    Education Details  form/technique with exercise    Person(s) Educated  Patient    Methods  Explanation;Demonstration    Comprehension  Verbalized understanding;Returned demonstration          PT Long Term Goals - 02/07/18 0832      PT LONG TERM GOAL #1   Title  Patient will be independent with balance HEP to continue benefits of therapy after discharge.    Baseline  dependent with form/technique for balance exercise    Time  6    Period  Weeks    Status  New    Target Date  03/20/18      PT LONG TERM GOAL #2   Title  Patient will improve FGA by 4 points to indicate significant  improvement with balancing while ambulating and decrease of fall risk    Time  6    Period  Weeks    Status  New    Target Date  03/20/18      PT LONG TERM GOAL #3   Title  Patient will be able to perform prolonged single leg stance >10sec with eyes closed to indicate improvement with static balance and decrease fall risk    Baseline  2 sec in standing EC    Time  6    Period  Weeks    Status  New    Target Date  03/20/18            Plan - 03/07/18 1238    Clinical Impression Statement  Continued to address balance difficulties and power in standing with exercises. Patient demonstrates greater amount of stepping out motions. Patient demonstrates decreased hip strength limiting ability to perform prolonged ambulation and patient will benefit from further skilled therapy to return to prior level of function.     Rehab Potential  Good    Clinical Impairments Affecting Rehab Potential  (-) hx of MS; (+) good motivation    PT Frequency  2x / week    PT Duration  6 weeks    PT Treatment/Interventions  Moist Heat;Iontophoresis 4mg /ml  Dexamethasone;Electrical Stimulation;Aquatic Therapy;Cryotherapy;Therapeutic activities;Neuromuscular re-education;Therapeutic exercise;Patient/family education;Dry needling;Manual techniques;Passive range of motion;Balance training;Functional mobility training;Gait training;Stair training;Joint Manipulations    PT Next Visit Plan  Continue addressing balance    PT Home Exercise Plan  See education    Consulted and Agree with Plan of Care  Patient       Patient will benefit from skilled therapeutic intervention in order to improve the following deficits and impairments:  Pain, Decreased coordination, Impaired perceived functional ability, Increased fascial restricitons, Increased muscle spasms, Difficulty walking, Abnormal gait, Decreased balance, Decreased mobility, Impaired sensation  Visit Diagnosis: Difficulty in walking, not elsewhere classified  History of falling     Problem List There are no active problems to display for this patient.   [name], PT DPT 03/07/2018, 12:42 PM  [facility] [address] [place], [address] Phone: [phone]   Fax:  [phone]  Name: Michelle Ruiz MRN: [id number] Date of Birth: [birth year]

## 2018-03-11 ENCOUNTER — Ambulatory Visit: Payer: BLUE CROSS/BLUE SHIELD

## 2018-03-11 ENCOUNTER — Encounter: Payer: Self-pay | Admitting: Obstetrics and Gynecology

## 2018-03-11 ENCOUNTER — Ambulatory Visit: Payer: BLUE CROSS/BLUE SHIELD | Admitting: Obstetrics and Gynecology

## 2018-03-11 VITALS — BP 124/74 | HR 100 | Ht 69.0 in | Wt 198.0 lb

## 2018-03-11 DIAGNOSIS — Z9181 History of falling: Secondary | ICD-10-CM

## 2018-03-11 DIAGNOSIS — M25521 Pain in right elbow: Secondary | ICD-10-CM | POA: Diagnosis not present

## 2018-03-11 DIAGNOSIS — R262 Difficulty in walking, not elsewhere classified: Secondary | ICD-10-CM | POA: Diagnosis not present

## 2018-03-11 DIAGNOSIS — R300 Dysuria: Secondary | ICD-10-CM

## 2018-03-11 LAB — POCT URINALYSIS DIPSTICK
Bilirubin, UA: NEGATIVE
Glucose, UA: NEGATIVE
Ketones, UA: NEGATIVE
Leukocytes, UA: NEGATIVE
Nitrite, UA: NEGATIVE
Protein, UA: NEGATIVE
Spec Grav, UA: 1.01 (ref 1.010–1.025)
pH, UA: 6 (ref 5.0–8.0)

## 2018-03-11 NOTE — Progress Notes (Signed)
Copland, Deirdre Evener, PA-C   Chief Complaint  Patient presents with  . Urinary Tract Infection    at the end of pts urine stream it burns, no frequency or blood in urine, sometimes odor since saturday    HPI:      [name] is a 47 y.o. G2P1011 who LMP was Patient's last menstrual period was 03/04/2018 (exact date)., presents today for dysuria sx for 3 days. No frequency/urgency/hematuria/LBP/fevers. Hx of UTIs in past, bacteria usually Proteus and resistant to macrobid. Due to pt drug allergies, usually has to take cipro and pt states needs 10 days worth to get resolution. Usually has more sx than this, however. No vag sx. Stopped menses yesterday.    Past Medical History:  Diagnosis Date  . Abnormal Pap smear of cervix   . Anxiety   . Kidney stones   . MS (multiple sclerosis) ([place])   . UTI (urinary tract infection)     Past Surgical History:  Procedure Laterality Date  . BREAST BIOPSY Right 2010   stereotactic biopsy/ neg/ dr.byrnett  . BREAST BIOPSY Left 2012   neg/dr. brynett  . CESAREAN SECTION  2008   RPH  . TONSILLECTOMY  2006    Family History  Problem Relation Age of Onset  . Endometrial cancer Mother 41  . Multiple sclerosis Sister 57  . Cancer Sister 83       appendix  . Autism Son        Infantile  . Diabetes type II Other        Aunt  . Bladder Cancer Neg Hx   . Kidney cancer Neg Hx     Social History   Socioeconomic History  . Marital status: Married    Spouse name: Not on file  . Number of children: Not on file  . Years of education: Not on file  . Highest education level: Not on file  Occupational History  . Not on file  Social Needs  . Financial resource strain: Not on file  . Food insecurity:    Worry: Not on file    Inability: Not on file  . Transportation needs:    Medical: Not on file    Non-medical: Not on file  Tobacco Use  . Smoking status: Never Smoker  . Smokeless tobacco: Never Used  Substance and Sexual  Activity  . Alcohol use: Not Currently    Alcohol/week: 4.0 standard drinks    Types: 4 Glasses of wine per week  . Drug use: No  . Sexual activity: Yes    Birth control/protection: Surgical    Comment: Vasectomy  Lifestyle  . Physical activity:    Days per week: Not on file    Minutes per session: Not on file  . Stress: Not on file  Relationships  . Social connections:    Talks on phone: Not on file    Gets together: Not on file    Attends religious service: Not on file    Active member of club or organization: Not on file    Attends meetings of clubs or organizations: Not on file    Relationship status: Not on file  . Intimate partner violence:    Fear of current or ex partner: Not on file    Emotionally abused: Not on file    Physically abused: Not on file    Forced sexual activity: Not on file  Other Topics Concern  . Not on file  Social History  Narrative  . Not on file    Outpatient Medications Prior to Visit  Medication Sig Dispense Refill  . Ascorbic Acid (VITAMIN C) 1000 MG tablet Take 1,000 mg by mouth daily.    . baclofen (LIORESAL) 10 MG tablet 10 mg 2 (two) times daily.    Marland Kitchen buPROPion (WELLBUTRIN SR) 100 MG 12 hr tablet TAKE 1 TABLET BY MOUTH TWICE DAILY AT 8AM AND 3PM  1  . Cholecalciferol (PA VITAMIN D-3) 2000 UNITS CAPS Take 2,000 Units by mouth.    . clonazePAM (KLONOPIN) 0.5 MG tablet TAKE 1 TABLET BY MOUTH EVERY DAY AS NEEDED **STOP XANAX**  3  . cyanocobalamin 2000 MCG tablet Take 2,000 mcg by mouth daily.    Marland Kitchen dalfampridine (AMPYRA) 10 MG TB12 Take 10 mg by mouth 2 (two) times daily.    . Dimethyl Fumarate (TECFIDERA) 240 MG CPDR 240 mg 1 day or 1 dose.    . sertraline (ZOLOFT) 50 MG tablet Take 100 mg by mouth.     No facility-administered medications prior to visit.       ROS:  Review of Systems  Constitutional: Negative for fever.  Gastrointestinal: Negative for blood in stool, constipation, diarrhea, nausea and vomiting.  Genitourinary:  Positive for dysuria. Negative for dyspareunia, flank pain, frequency, hematuria, urgency, vaginal bleeding, vaginal discharge and vaginal pain.  Musculoskeletal: Negative for back pain.  Skin: Negative for rash.    OBJECTIVE:   Vitals:  BP 124/74   Pulse 100   Ht 5\' 9"  (1.753 m)   Wt 198 lb (89.8 kg)   LMP 03/04/2018 (Exact Date)   BMI 29.24 kg/m   Physical Exam Vitals signs reviewed.  Constitutional:      Appearance: She is well-developed.  Neck:     Musculoskeletal: Normal range of motion.  Pulmonary:     Effort: Pulmonary effort is normal.  Abdominal:     Tenderness: There is no right CVA tenderness or left CVA tenderness.  Neurological:     Mental Status: She is alert and oriented to person, place, and time.     Cranial Nerves: No cranial nerve deficit.  Psychiatric:        Behavior: Behavior normal.        Thought Content: Thought content normal.        Judgment: Judgment normal.     Results: Results for orders placed or performed in visit on 03/11/18 (from the past 24 hour(s))  POCT Urinalysis Dipstick     Status: Normal   Collection Time: 03/11/18  2:46 PM  Result Value Ref Range   Color, UA yellow    Clarity, UA clear    Glucose, UA Negative Negative   Bilirubin, UA neg    Ketones, UA neg    Spec Grav, UA 1.010 1.010 - 1.025   Blood, UA trace    pH, UA 6.0 5.0 - 8.0   Protein, UA Negative Negative   Urobilinogen, UA     Nitrite, UA neg    Leukocytes, UA Negative Negative   Appearance     Odor     FINISHED MENSES YESTERDAY, QUESTION BLOOD FROM URINE OR VAGINA  Assessment/Plan: Dysuria - Neg UA. Check C&S. Will call with results. Several drug allergies. May need levaquin prn C&S results.  - Plan: POCT Urinalysis Dipstick, Urine Culture    Return if symptoms worsen or fail to improve.  Alicia B. Copland, PA-C 03/11/2018 2:53 PM

## 2018-03-11 NOTE — Patient Instructions (Signed)
I value your feedback and entrusting us with your care. If you get a [facility] patient survey, I would appreciate you taking the time to let us know about your experience today. Thank you! 

## 2018-03-11 NOTE — Therapy (Signed)
[facility] [address] [place], [address] Phone: [phone]   Fax:  [phone]  Physical Therapy Treatment  Patient Details  Name: Michelle Ruiz MRN: [id number] Date of Birth: [birth year] Referring Provider (PT): Darrin Luis MD   Encounter Date: 03/11/2018  PT End of Session - 03/11/18 1811    Visit Number  9    Number of Visits  13    Date for PT Re-Evaluation  03/20/18    Authorization Type  BCBS    PT Start Time  1745    PT Stop Time  1830    PT Time Calculation (min)  45 min    Activity Tolerance  Patient tolerated treatment well    Behavior During Therapy  [facility] for tasks assessed/performed       Past Medical History:  Diagnosis Date  . Abnormal Pap smear of cervix   . Anxiety   . Kidney stones   . MS (multiple sclerosis) ([place])   . UTI (urinary tract infection)     Past Surgical History:  Procedure Laterality Date  . BREAST BIOPSY Right 2010   stereotactic biopsy/ neg/ dr.byrnett  . BREAST BIOPSY Left 2012   neg/dr. brynett  . CESAREAN SECTION  2008   RPH  . TONSILLECTOMY  2006    There were no vitals filed for this visit.  Subjective Assessment - 03/11/18 1802    Subjective  Patient reports she was able to perform her HEP at home and states she has been improving.     Pertinent History  Patient reports history of falls x3 in the past 6 months secondary to drop foot on the L LE. PMH: Multiple sclerosis; elbow fracture of the R UE    Limitations  Standing;Walking    How long can you stand comfortably?  20 min    How long can you walk comfortably?  1 miles     Patient Stated Goals  To be more balanced and core strengthening     Currently in Pain?  No/denies    Pain Onset  More than a month ago       TREATMENT:    Therapeutic Exercise Step ups onto bosu ball onto blue side - x 15  Agility ladder hops forward double hops - x 15 Hip machine abduction - 2 x 10 40#  Side  stepping up and down Bosu ball - x 20  Ball toss with standing on airex pad - x 20  Running man on airex pad - x 10 B with UE support  Box lifts from airex pad - x 10 with 5# weight in box Foot sliders in standing hip abduction - x 10    Patient fatigued with no complaints of pain at end of session.  PT Education - 03/11/18 1810    Education Details  form/technique with exercise    Person(s) Educated  Patient    Methods  Explanation;Demonstration    Comprehension  Verbalized understanding;Returned demonstration          PT Long Term Goals - 02/07/18 0832      PT LONG TERM GOAL #1   Title  Patient will be independent with balance HEP to continue benefits of therapy after discharge.    Baseline  dependent with form/technique for balance exercise    Time  6    Period  Weeks    Status  New    Target Date  03/20/18  PT LONG TERM GOAL #2   Title  Patient will improve FGA by 4 points to indicate significant improvement with balancing while ambulating and decrease of fall risk    Time  6    Period  Weeks    Status  New    Target Date  03/20/18      PT LONG TERM GOAL #3   Title  Patient will be able to perform prolonged single leg stance >10sec with eyes closed to indicate improvement with static balance and decrease fall risk    Baseline  2 sec in standing EC    Time  6    Period  Weeks    Status  New    Target Date  03/20/18            Plan - 03/11/18 1825    Clinical Impression Statement  Patient demonstrates improvement with exercise performance with ability to perform triple hop where previously fearful to perform x1 week. Although patient is improving, she continues to demonstrates decreased strength and balance with single leg positions. Patient will benefi tfrom further skilled therapy to return to prior level of function.     Clinical Decision Making  Low    Rehab Potential  Good    Clinical Impairments Affecting Rehab Potential  (-) hx of MS; (+) good  motivation    PT Frequency  2x / week    PT Duration  6 weeks    PT Treatment/Interventions  Moist Heat;Iontophoresis 4mg /ml Dexamethasone;Electrical Stimulation;Aquatic Therapy;Cryotherapy;Therapeutic activities;Neuromuscular re-education;Therapeutic exercise;Patient/family education;Dry needling;Manual techniques;Passive range of motion;Balance training;Functional mobility training;Gait training;Stair training;Joint Manipulations    PT Next Visit Plan  Continue addressing balance    PT Home Exercise Plan  See education    Consulted and Agree with Plan of Care  Patient       Patient will benefit from skilled therapeutic intervention in order to improve the following deficits and impairments:  Pain, Decreased coordination, Impaired perceived functional ability, Increased fascial restricitons, Increased muscle spasms, Difficulty walking, Abnormal gait, Decreased balance, Decreased mobility, Impaired sensation  Visit Diagnosis: Difficulty in walking, not elsewhere classified  History of falling     Problem List There are no active problems to display for this patient.   [name], PT DPT 03/11/2018, 6:33 PM  [facility] [address], [place], [address] Phone: [phone]   Fax:  [phone]  Name: Michelle Ruiz MRN: [id number] Date of Birth: [birth year]

## 2018-03-13 ENCOUNTER — Ambulatory Visit: Payer: BLUE CROSS/BLUE SHIELD

## 2018-03-13 ENCOUNTER — Other Ambulatory Visit: Payer: Self-pay | Admitting: Obstetrics and Gynecology

## 2018-03-13 LAB — URINE CULTURE

## 2018-03-13 MED ORDER — NITROFURANTOIN MONOHYD MACRO 100 MG PO CAPS: 100.0000 mg | ORAL_CAPSULE | Freq: Two times a day (BID) | ORAL | 0 refills | Status: AC

## 2018-03-13 NOTE — Progress Notes (Signed)
Rx macrobid for pos urine C&S.

## 2018-03-25 ENCOUNTER — Ambulatory Visit: Payer: BLUE CROSS/BLUE SHIELD

## 2018-03-25 DIAGNOSIS — R262 Difficulty in walking, not elsewhere classified: Secondary | ICD-10-CM | POA: Diagnosis not present

## 2018-03-25 DIAGNOSIS — Z9181 History of falling: Secondary | ICD-10-CM

## 2018-03-25 DIAGNOSIS — M25521 Pain in right elbow: Secondary | ICD-10-CM | POA: Diagnosis not present

## 2018-03-26 NOTE — Therapy (Signed)
[facility] [address] [place], [address] Phone: [phone]   Fax:  [phone]  Physical Therapy Treatment  Patient Details  Name: YOCHEVED DEPNER MRN: [id number] Date of Birth: [birth year] Referring Provider (PT): Darrin Luis MD   Encounter Date: 03/25/2018  PT End of Session - 03/25/18 1842    Visit Number  10    Number of Visits  13    Date for PT Re-Evaluation  04/22/18    Authorization Type  BCBS    PT Start Time  1815    PT Stop Time  1900    PT Time Calculation (min)  45 min    Activity Tolerance  Patient tolerated treatment well    Behavior During Therapy  [facility] for tasks assessed/performed       Past Medical History:  Diagnosis Date  . Abnormal Pap smear of cervix   . Anxiety   . Kidney stones   . MS (multiple sclerosis) ([place])   . UTI (urinary tract infection)     Past Surgical History:  Procedure Laterality Date  . BREAST BIOPSY Right 2010   stereotactic biopsy/ neg/ dr.byrnett  . BREAST BIOPSY Left 2012   neg/dr. brynett  . CESAREAN SECTION  2008   RPH  . TONSILLECTOMY  2006    There were no vitals filed for this visit.  Subjective Assessment - 03/25/18 1834    Subjective  Patient states she continues to perform her HEP and states walking has been good. Patient states her shoes sometimes "scuff" but she is able to maintain her balance.     Pertinent History  Patient reports history of falls x3 in the past 6 months secondary to drop foot on the L LE. PMH: Multiple sclerosis; elbow fracture of the R UE    Limitations  Standing;Walking    How long can you stand comfortably?  20 min    How long can you walk comfortably?  1 miles     Patient Stated Goals  To be more balanced and core strengthening     Currently in Pain?  No/denies    Pain Onset  More than a month ago         TREATMENT- RECERT Therapeutic Exercise EC Single leg stance - 2 x 30sec Side stepping up and down - x  20 4" Pro stretch in standing - x 20 with UE support Lunges in standing - x 20  Squats in standing with UE support - x 20  Forward stepping over hurdles - x 20  Tandem ambulation - x 20  EC ambulation - 3 x 65ft  Patient demonstrates increased fatigue at the end of the session    PT Education - 03/25/18 [facility]    Education Details  form/technique with exercise    Person(s) Educated  Patient    Methods  Explanation;Demonstration    Comprehension  Verbalized understanding;Returned demonstration          PT Long Term Goals - 03/25/18 1844      PT LONG TERM GOAL #1   Title  Patient will be independent with balance HEP to continue benefits of therapy after discharge.    Baseline  dependent with form/technique for balance exercise; 03/25/2018: Independent     Time  6    Period  Weeks    Status  Achieved      PT LONG TERM GOAL #2   Title  Patient will improve FGA by 4  points to indicate significant improvement with balancing while ambulating and decrease of fall risk    Baseline  03/25/2018: 28/30    Time  6    Period  Weeks    Status  Achieved      PT LONG TERM GOAL #3   Title  Patient will be able to perform prolonged single leg stance >10sec with eyes closed to indicate improvement with static balance and decrease fall risk    Baseline  2 sec in standing EC; 03/25/2018: 3 sec     Time  6    Period  Weeks    Status  On-going            Plan - 03/26/18 2440    Clinical Impression Statement  Patient demonstrates improvement with her long term goals with ability to perform single leg stance with her EC as well as overall improvement with her FGA scores. Although patient is improving, she continues to have decreased single leg stance positions and patient will benefit from further skilled therapy.     Rehab Potential  Good    Clinical Impairments Affecting Rehab Potential  (-) hx of MS; (+) good motivation    PT Frequency  2x / week    PT Duration  6 weeks    PT  Treatment/Interventions  Moist Heat;Iontophoresis 4mg /ml Dexamethasone;Electrical Stimulation;Aquatic Therapy;Cryotherapy;Therapeutic activities;Neuromuscular re-education;Therapeutic exercise;Patient/family education;Dry needling;Manual techniques;Passive range of motion;Balance training;Functional mobility training;Gait training;Stair training;Joint Manipulations    PT Next Visit Plan  Continue addressing balance    PT Home Exercise Plan  See education    Consulted and Agree with Plan of Care  Patient       Patient will benefit from skilled therapeutic intervention in order to improve the following deficits and impairments:  Pain, Decreased coordination, Impaired perceived functional ability, Increased fascial restricitons, Increased muscle spasms, Difficulty walking, Abnormal gait, Decreased balance, Decreased mobility, Impaired sensation  Visit Diagnosis: Difficulty in walking, not elsewhere classified  History of falling     Problem List There are no active problems to display for this patient.   [name], PT DPT 03/26/2018, 8:14 AM  [facility] [address] [place], [address] Phone: [phone]   Fax:  [phone]  Name: SAHARRA SANTO MRN: [id number] Date of Birth: 1971-03-03

## 2018-04-02 ENCOUNTER — Ambulatory Visit: Payer: BLUE CROSS/BLUE SHIELD | Attending: Neurology

## 2018-04-02 DIAGNOSIS — Z9181 History of falling: Secondary | ICD-10-CM

## 2018-04-02 DIAGNOSIS — R262 Difficulty in walking, not elsewhere classified: Secondary | ICD-10-CM | POA: Diagnosis not present

## 2018-04-02 NOTE — Therapy (Signed)
[facility] [address] [place], [address] Phone: [phone]   Fax:  [phone]  Physical Therapy Treatment  Patient Details  Name: Michelle Ruiz MRN: [id number] Date of Birth: [birth year] Referring Provider (PT): Darrin Luis MD   Encounter Date: 04/02/2018  PT End of Session - 04/02/18 1726    Visit Number  11    Number of Visits  13    Date for PT Re-Evaluation  04/22/18    Authorization Type  BCBS    PT Start Time  1700    PT Stop Time  1745    PT Time Calculation (min)  45 min    Activity Tolerance  Patient tolerated treatment well    Behavior During Therapy  [facility] for tasks assessed/performed       Past Medical History:  Diagnosis Date  . Abnormal Pap smear of cervix   . Anxiety   . Kidney stones   . MS (multiple sclerosis) ([place])   . UTI (urinary tract infection)     Past Surgical History:  Procedure Laterality Date  . BREAST BIOPSY Right 2010   stereotactic biopsy/ neg/ dr.byrnett  . BREAST BIOPSY Left 2012   neg/dr. brynett  . CESAREAN SECTION  2008   RPH  . TONSILLECTOMY  2006    There were no vitals filed for this visit.  Subjective Assessment - 04/02/18 1720    Subjective  Patient states she slightly twisted her ankle over the weekend but was able to continue with little to no pain.     Pertinent History  Patient reports history of falls x3 in the past 6 months secondary to drop foot on the L LE. PMH: Multiple sclerosis; elbow fracture of the R UE    Limitations  Standing;Walking    How long can you stand comfortably?  20 min    How long can you walk comfortably?  1 miles     Patient Stated Goals  To be more balanced and core strengthening     Currently in Pain?  No/denies    Pain Onset  More than a month ago       TREATMENT Therapeutic Exercise Lunges sideways in standing - x 20  Running man on an airex pad - x 20  Squats in standing with UE support - x 20  Single leg  with pushing ball into wall - x 20  Standing semi tandem ball toss on the airex pad - x 20  Ball throws from kneeling position on a pillow - x 20  Pro stretch in standing - x 20 with UE support Hip abduction in standing with hip machine -- x 20 B 40#  Patient demonstrates increased fatigue at the end of the session  PT Education - 04/02/18 1725    Education Details  form/technique with exercise    Person(s) Educated  Patient    Methods  Explanation;Demonstration    Comprehension  Verbalized understanding;Returned demonstration          PT Long Term Goals - 03/25/18 1844      PT LONG TERM GOAL #1   Title  Patient will be independent with balance HEP to continue benefits of therapy after discharge.    Baseline  dependent with form/technique for balance exercise; 03/25/2018: Independent     Time  6    Period  Weeks    Status  Achieved      PT LONG TERM GOAL #2  Title  Patient will improve FGA by 4 points to indicate significant improvement with balancing while ambulating and decrease of fall risk    Baseline  03/25/2018: 28/30    Time  6    Period  Weeks    Status  Achieved      PT LONG TERM GOAL #3   Title  Patient will be able to perform prolonged single leg stance >10sec with eyes closed to indicate improvement with static balance and decrease fall risk    Baseline  2 sec in standing EC; 03/25/2018: 3 sec     Time  6    Period  Weeks    Status  On-going            Plan - 04/02/18 1728    Clinical Impression Statement  Patient demonstrates improvement with ability to perform greater single leg exercises indicating improvement in static and dynamic balance. Although patient is improving, she continues to demonstrates early onset of fatigue and decreased muscular endurance with exercises. Patient will benefit from further skilled therapy to return to prior level of function.     Rehab Potential  Good    Clinical Impairments Affecting Rehab Potential  (-) hx of MS;  (+) good motivation    PT Frequency  2x / week    PT Duration  6 weeks    PT Treatment/Interventions  Moist Heat;Iontophoresis 4mg /ml Dexamethasone;Electrical Stimulation;Aquatic Therapy;Cryotherapy;Therapeutic activities;Neuromuscular re-education;Therapeutic exercise;Patient/family education;Dry needling;Manual techniques;Passive range of motion;Balance training;Functional mobility training;Gait training;Stair training;Joint Manipulations    PT Next Visit Plan  Continue addressing balance    PT Home Exercise Plan  See education    Consulted and Agree with Plan of Care  Patient       Patient will benefit from skilled therapeutic intervention in order to improve the following deficits and impairments:  Pain, Decreased coordination, Impaired perceived functional ability, Increased fascial restricitons, Increased muscle spasms, Difficulty walking, Abnormal gait, Decreased balance, Decreased mobility, Impaired sensation  Visit Diagnosis: Difficulty in walking, not elsewhere classified  History of falling     Problem List There are no active problems to display for this patient.   [name], PT DPT 04/02/2018, 5:34 PM  [place] [address] [place], [address] Phone: [phone]   Fax:  [phone]  Name: ZAEDA MCFERRAN MRN: [id number] Date of Birth: 1970/04/28

## 2018-04-03 DIAGNOSIS — Z8659 Personal history of other mental and behavioral disorders: Secondary | ICD-10-CM | POA: Diagnosis not present

## 2018-04-03 DIAGNOSIS — G35 Multiple sclerosis: Secondary | ICD-10-CM | POA: Diagnosis not present

## 2018-04-03 DIAGNOSIS — R2689 Other abnormalities of gait and mobility: Secondary | ICD-10-CM | POA: Diagnosis not present

## 2018-04-03 DIAGNOSIS — R441 Visual hallucinations: Secondary | ICD-10-CM | POA: Diagnosis not present

## 2018-04-08 ENCOUNTER — Ambulatory Visit: Payer: BLUE CROSS/BLUE SHIELD

## 2018-04-10 ENCOUNTER — Ambulatory Visit: Payer: BLUE CROSS/BLUE SHIELD

## 2018-04-10 DIAGNOSIS — R262 Difficulty in walking, not elsewhere classified: Secondary | ICD-10-CM

## 2018-04-10 DIAGNOSIS — Z9181 History of falling: Secondary | ICD-10-CM

## 2018-04-10 NOTE — Therapy (Signed)
[facility] [address] [place], [address] Phone: [phone]   Fax:  [phone]  Physical Therapy Treatment  Patient Details  Name: Michelle Ruiz MRN: [id number] Date of Birth: 20-Nov-1970 Referring Provider (PT): Darrin Luis MD   Encounter Date: 04/10/2018  PT End of Session - 04/10/18 1848    Visit Number  12    Number of Visits  13    Date for PT Re-Evaluation  04/22/18    Authorization Type  BCBS    PT Start Time  1800    PT Stop Time  1845    PT Time Calculation (min)  45 min    Activity Tolerance  Patient tolerated treatment well    Behavior During Therapy  [facility] for tasks assessed/performed       Past Medical History:  Diagnosis Date  . Abnormal Pap smear of cervix   . Anxiety   . Kidney stones   . MS (multiple sclerosis) ([place])   . UTI (urinary tract infection)     Past Surgical History:  Procedure Laterality Date  . BREAST BIOPSY Right 2010   stereotactic biopsy/ neg/ dr.byrnett  . BREAST BIOPSY Left 2012   neg/dr. brynett  . CESAREAN SECTION  2008   RPH  . TONSILLECTOMY  2006    There were no vitals filed for this visit.  Subjective Assessment - 04/10/18 1840    Subjective  Patient reports her ankle is bothering her slightly but not as much as it was the previous week. Patient states she had a poor day with her MS on Monday limiting her ability to participate in therapy thus canceling.     Pertinent History  Patient reports history of falls x3 in the past 6 months secondary to drop foot on the L LE. PMH: Multiple sclerosis; elbow fracture of the R UE    Limitations  Standing;Walking    How long can you stand comfortably?  20 min    How long can you walk comfortably?  1 miles     Patient Stated Goals  To be more balanced and core strengthening     Currently in Pain?  No/denies    Pain Onset  More than a month ago       TREATMENT  Therapeutic Exercise  Agility ladder drill:   . Hopping Forward down and back x1 (10 hops per length of ladder) B tandem jumping stance switching feet: x5 B narrowed tandem stance with eyes closed: 1x30 sec Backward walk over hurdles (6) down and back: x2 Single hurdle hop: 1x10 (with hurdle lying on ground) B lateral step-up on 8" step down and back: x10 Sagittal plane lunges B with UE support of one hand: 1x20 B lateral lunge on furniture slider with UE support: 1x15 Ball passes on Airex pad: 5 min with feet in sharpened [place]  Therapeutic Exercise to improve on balance, coordination, and LE strengthening/endurance to decrease fall risk.   PT Education - 04/10/18 1848    Education Details  form/technique with exercise    Person(s) Educated  Patient    Methods  Explanation;Demonstration    Comprehension  Returned demonstration;Verbalized understanding          PT Long Term Goals - 04/10/18 1855      PT LONG TERM GOAL #1   Title  Patient will be independent with balance HEP to continue benefits of therapy after discharge.    Baseline  dependent with form/technique for  balance exercise; 03/25/2018: Independent     Time  6    Period  Weeks    Status  Achieved      PT LONG TERM GOAL #2   Title  Patient will improve FGA by 4 points to indicate significant improvement with balancing while ambulating and decrease of fall risk    Baseline  03/25/2018: 28/30    Time  6    Period  Weeks    Status  Achieved      PT LONG TERM GOAL #3   Title  Patient will be able to perform prolonged single leg stance >10sec with eyes closed to indicate improvement with static balance and decrease fall risk    Baseline  2 sec in standing EC; 03/25/2018: 3 sec     Time  6    Period  Weeks    Status  On-going            Plan - 04/10/18 1849    Clinical Impression Statement  Pt able to perform greater hopping exercises and perform hops higher in air demonstarting improvement in power to benefit LE strength and decrease fall risk.  However, pt continues to show early onset fatigue and decreased LE endurance requiring breaks between and during exercises. Therefore pt can further benefit from skilled treatment to decrease fatigue, increase endurance, and Increase LE strength in order to decrease fall risk.    Rehab Potential  Good    Clinical Impairments Affecting Rehab Potential  (-) hx of MS; (+) good motivation    PT Frequency  2x / week    PT Duration  6 weeks    PT Treatment/Interventions  Moist Heat;Iontophoresis 4mg /ml Dexamethasone;Electrical Stimulation;Aquatic Therapy;Cryotherapy;Therapeutic activities;Neuromuscular re-education;Therapeutic exercise;Patient/family education;Dry needling;Manual techniques;Passive range of motion;Balance training;Functional mobility training;Gait training;Stair training;Joint Manipulations    PT Next Visit Plan  Continue addressing balance    PT Home Exercise Plan  See education    Consulted and Agree with Plan of Care  Patient       Patient will benefit from skilled therapeutic intervention in order to improve the following deficits and impairments:  Pain, Decreased coordination, Impaired perceived functional ability, Increased fascial restricitons, Increased muscle spasms, Difficulty walking, Abnormal gait, Decreased balance, Decreased mobility, Impaired sensation  Visit Diagnosis: Difficulty in walking, not elsewhere classified  History of falling     Problem List There are no active problems to display for this patient.   Rachael Fee, SPT 04/11/2018, 9:12 AM  [place] [facility] [address], [place], [address] Phone: [phone]   Fax:  [phone]  Name: Michelle Ruiz MRN: [id number] Date of Birth: [birth year]

## 2018-04-16 DIAGNOSIS — Z85828 Personal history of other malignant neoplasm of skin: Secondary | ICD-10-CM | POA: Diagnosis not present

## 2018-04-16 DIAGNOSIS — D229 Melanocytic nevi, unspecified: Secondary | ICD-10-CM | POA: Diagnosis not present

## 2018-04-16 DIAGNOSIS — L578 Other skin changes due to chronic exposure to nonionizing radiation: Secondary | ICD-10-CM | POA: Diagnosis not present

## 2018-04-16 DIAGNOSIS — L820 Inflamed seborrheic keratosis: Secondary | ICD-10-CM | POA: Diagnosis not present

## 2018-04-16 DIAGNOSIS — Z1283 Encounter for screening for malignant neoplasm of skin: Secondary | ICD-10-CM | POA: Diagnosis not present

## 2018-04-22 ENCOUNTER — Ambulatory Visit: Payer: BLUE CROSS/BLUE SHIELD

## 2018-04-22 DIAGNOSIS — Z9181 History of falling: Secondary | ICD-10-CM

## 2018-04-22 DIAGNOSIS — R262 Difficulty in walking, not elsewhere classified: Secondary | ICD-10-CM | POA: Diagnosis not present

## 2018-04-22 NOTE — Therapy (Signed)
[facility] [address] [place], [address] Phone: [phone]   Fax:  [phone]  Physical Therapy Treatment  Patient Details  Name: Michelle Ruiz MRN: [id number] Date of Birth: 12-15-1970 Referring Provider (PT): Darrin Luis MD   Encounter Date: 04/22/2018  PT End of Session - 04/22/18 1852    Visit Number  13    Number of Visits  13    Date for PT Re-Evaluation  04/22/18    Authorization Type  BCBS    PT Start Time  1800    PT Stop Time  1845    PT Time Calculation (min)  45 min    Activity Tolerance  Patient tolerated treatment well    Behavior During Therapy  [facility] for tasks assessed/performed       Past Medical History:  Diagnosis Date  . Abnormal Pap smear of cervix   . Anxiety   . Kidney stones   . MS (multiple sclerosis) ([place])   . UTI (urinary tract infection)     Past Surgical History:  Procedure Laterality Date  . BREAST BIOPSY Right 2010   stereotactic biopsy/ neg/ dr.byrnett  . BREAST BIOPSY Left 2012   neg/dr. brynett  . CESAREAN SECTION  2008   RPH  . TONSILLECTOMY  2006    There were no vitals filed for this visit.  Subjective Assessment - 04/22/18 1850    Subjective  Patient reports her ankle has been feeling much better since the previous session and states she has been performing her exercises. Patient states no major changes otherwise.     Pertinent History  Patient reports history of falls x3 in the past 6 months secondary to drop foot on the L LE. PMH: Multiple sclerosis; elbow fracture of the R UE    Limitations  Standing;Walking    How long can you stand comfortably?  20 min    How long can you walk comfortably?  1 miles     Patient Stated Goals  To be more balanced and core strengthening     Currently in Pain?  No/denies    Pain Onset  More than a month ago           TREATMENT  Therapeutic Exercise Side stepping up and down 8" step - x10 without UE  support Lunges in sagittal plane - x 15 Side lunges with UE support - x 15 Squats with 3kg ball - 2 x 20  Hip abduction with BTB - x15 Hip extension with BTB - x 15  Single leg stance on airex - x 2 min Ball passes on Airex pad: 5 min with feet in sharpened [place] Airex beam tandem ambulation - x 20    Therapeutic Exercise to improve on balance, coordination, and LE strengthening/endurance to decrease fall risk and improve walking quality   PT Education - 04/22/18 1852    Education Details  form/technique with exercise    Person(s) Educated  Patient    Methods  Explanation;Demonstration    Comprehension  Verbalized understanding;Returned demonstration          PT Long Term Goals - 04/10/18 1855      PT LONG TERM GOAL #1   Title  Patient will be independent with balance HEP to continue benefits of therapy after discharge.    Baseline  dependent with form/technique for balance exercise; 03/25/2018: Independent     Time  6    Period  Weeks  Status  Achieved      PT LONG TERM GOAL #2   Title  Patient will improve FGA by 4 points to indicate significant improvement with balancing while ambulating and decrease of fall risk    Baseline  03/25/2018: 28/30    Time  6    Period  Weeks    Status  Achieved      PT LONG TERM GOAL #3   Title  Patient will be able to perform prolonged single leg stance >10sec with eyes closed to indicate improvement with static balance and decrease fall risk    Baseline  2 sec in standing EC; 03/25/2018: 3 sec     Time  6    Period  Weeks    Status  On-going            Plan - 04/22/18 1853    Clinical Impression Statement  Patient demonstrates improvement with exercises with ability to perform greater amount off exercises before requiring standing rest breaks. Although patient is improving, she conitnues to requiring UE support to perform high level dynamic activities such as single leg stance on compliant surfaces and ambulating in tandem  across airex beam. Patient will benefit from [place] skilled therapy to return to prior level of function.     Rehab Potential  Good    Clinical Impairments Affecting Rehab Potential  (-) hx of MS; (+) good motivation    PT Frequency  2x / week    PT Duration  6 weeks    PT Treatment/Interventions  Moist Heat;Iontophoresis 4mg /ml Dexamethasone;Electrical Stimulation;Aquatic Therapy;Cryotherapy;Therapeutic activities;Neuromuscular re-education;Therapeutic exercise;Patient/family education;Dry needling;Manual techniques;Passive range of motion;Balance training;Functional mobility training;Gait training;Stair training;Joint Manipulations    PT Next Visit Plan  Continue addressing balance    PT Home Exercise Plan  See education    Consulted and Agree with Plan of Care  Patient       Patient will benefit from skilled therapeutic intervention in order to improve the following deficits and impairments:  Pain, Decreased coordination, Impaired perceived functional ability, Increased fascial restricitons, Increased muscle spasms, Difficulty walking, Abnormal gait, Decreased balance, Decreased mobility, Impaired sensation  Visit Diagnosis: Difficulty in walking, not elsewhere classified  History of falling     Problem List There are no active problems to display for this patient.   [name], PT DPT 04/22/2018, 6:56 PM  [place] [facility] [address] [place], [address] Phone: [phone]   Fax:  [phone]  Name: Michelle Ruiz MRN: [id number] Date of Birth: January 21, 1971

## 2018-04-24 ENCOUNTER — Ambulatory Visit: Payer: BLUE CROSS/BLUE SHIELD

## 2018-04-29 ENCOUNTER — Ambulatory Visit: Payer: BLUE CROSS/BLUE SHIELD | Attending: Neurology

## 2018-04-29 DIAGNOSIS — Z9181 History of falling: Secondary | ICD-10-CM | POA: Diagnosis not present

## 2018-04-29 DIAGNOSIS — R262 Difficulty in walking, not elsewhere classified: Secondary | ICD-10-CM | POA: Diagnosis not present

## 2018-04-29 NOTE — Therapy (Signed)
[place] [address], [place], [address] Phone: [phone]   Fax:  [phone]  Physical Therapy Treatment  Patient Details  Name: Michelle Ruiz MRN: [id number] Date of Birth: September 10, 1970 Referring Provider (PT): Darrin Luis MD   Encounter Date: 04/29/2018  PT End of Session - 04/29/18 1755    Visit Number  14    Number of Visits  20    Date for PT Re-Evaluation  06/10/18    Authorization Type  BCBS    PT Start Time  1324    PT Stop Time  1800    PT Time Calculation (min)  45 min    Activity Tolerance  Patient tolerated treatment well    Behavior During Therapy  [facility] for tasks assessed/performed       Past Medical History:  Diagnosis Date  . Abnormal Pap smear of cervix   . Anxiety   . Kidney stones   . MS (multiple sclerosis) ([place])   . UTI (urinary tract infection)     Past Surgical History:  Procedure Laterality Date  . BREAST BIOPSY Right 2010   stereotactic biopsy/ neg/ dr.byrnett  . BREAST BIOPSY Left 2012   neg/dr. brynett  . CESAREAN SECTION  2008   RPH  . TONSILLECTOMY  2006    There were no vitals filed for this visit.  Subjective Assessment - 04/29/18 1753    Subjective  Pt reports no pain or relapse since previous sesssion. No pain reported in ankle at this time. Pt reports difficulty in gettind medications to help with her MS.     Pertinent History  Patient reports history of falls x3 in the past 6 months secondary to drop foot on the L LE. PMH: Multiple sclerosis; elbow fracture of the R UE    Limitations  Standing;Walking    How long can you stand comfortably?  20 min    How long can you walk comfortably?  1 miles     Patient Stated Goals  To be more balanced and core strengthening     Currently in Pain?  No/denies    Pain Onset  More than a month ago     TREATMENT  THERAPEUTIC EXERCISE   B lateral lunge 8" step: 1x15 each Walking lunges: 4x by 10 feet Sideways walk  with TB (red): 4x by 10 feet Tandem stance med ball toss: 2x12 per foot, with 2 kg ball Tandem stance on airex beam to 2x4 board to  foam bolster: 3x down and back Hip extension machine: 2x12, 40 lbs.  Single leg stance with eyes closed: x4 by 15 sec   Therapeutic exercises to improve LE strength and to improve balance. Pt continues to demonstrate fatigue in between sets and in between exercises.   PT Education - 04/29/18 1754    Education Details  Form/technique with exercise.    Person(s) Educated  Patient    Methods  Explanation;Demonstration    Comprehension  Verbalized understanding;Returned demonstration          PT Long Term Goals - 04/29/18 1816      PT LONG TERM GOAL #3   Baseline  2 sec in standing EC; 03/25/2018: 3 sec; 04/29/2018: 4 sec    Time  6    Period  Weeks    Status  On-going    Target Date  06/10/18            Plan - 04/29/18 1808  Clinical Impression Statement  Pt was able to tolerate improved number of resistance exercises and balance but pt continues to display fatigue and LE weakness. Pt has achieved all goals except SLS with eyes closed. However pt was able to improve SLS time to 4 seconds. Due to a decrease in LE endurance and cardiorespiratory endurance, a new, long-term goal was added by improving 6 MWT time. This goal was deferred to next session due to not enough time to perform. Pt can continue to benefit from skilled PT treatment to continue to improve LE strength/endurance, and cardiorespiratory fitness to return to PLOF.    Rehab Potential  Good    Clinical Impairments Affecting Rehab Potential  (-) hx of MS; (+) good motivation    PT Frequency  2x / week    PT Duration  6 weeks    PT Treatment/Interventions  Moist Heat;Iontophoresis 4mg /ml Dexamethasone;Electrical Stimulation;Aquatic Therapy;Cryotherapy;Therapeutic activities;Neuromuscular re-education;Therapeutic exercise;Patient/family education;Dry needling;Manual techniques;Passive  range of motion;Balance training;Functional mobility training;Gait training;Stair training;Joint Manipulations    PT Next Visit Plan  Continue addressing balance    PT Home Exercise Plan  See education    Consulted and Agree with Plan of Care  Patient       Patient will benefit from skilled therapeutic intervention in order to improve the following deficits and impairments:  Pain, Decreased coordination, Impaired perceived functional ability, Increased fascial restricitons, Increased muscle spasms, Difficulty walking, Abnormal gait, Decreased balance, Decreased mobility, Impaired sensation  Visit Diagnosis: Difficulty in walking, not elsewhere classified     Problem List There are no active problems to display for this patient.   Rachael Fee, SPT 04/29/2018, 6:20 PM  [place] [facility] [address] [place], [address] Phone: [phone]   Fax:  [phone]  Name: Michelle Ruiz MRN: [id number] Date of Birth: 1970-12-09

## 2018-05-01 ENCOUNTER — Ambulatory Visit: Payer: BLUE CROSS/BLUE SHIELD

## 2018-05-01 DIAGNOSIS — R262 Difficulty in walking, not elsewhere classified: Secondary | ICD-10-CM | POA: Diagnosis not present

## 2018-05-01 DIAGNOSIS — Z9181 History of falling: Secondary | ICD-10-CM | POA: Diagnosis not present

## 2018-05-02 NOTE — Therapy (Signed)
[facility] [address] [place], [address] Phone: [phone]   Fax:  [phone]  Physical Therapy Treatment  Patient Details  Name: Michelle Ruiz MRN: [id number] Date of Birth: Dec 16, 1970 Referring Provider (PT): Darrin Luis MD   Encounter Date: 05/01/2018  PT End of Session - 05/01/18 1859    Visit Number  15    Number of Visits  20    Date for PT Re-Evaluation  06/10/18    Authorization Type  BCBS    PT Start Time  1849    PT Stop Time  1930    PT Time Calculation (min)  41 min    Activity Tolerance  Patient tolerated treatment well    Behavior During Therapy  [facility] for tasks assessed/performed       Past Medical History:  Diagnosis Date  . Abnormal Pap smear of cervix   . Anxiety   . Kidney stones   . MS (multiple sclerosis) ([place])   . UTI (urinary tract infection)     Past Surgical History:  Procedure Laterality Date  . BREAST BIOPSY Right 2010   stereotactic biopsy/ neg/ dr.byrnett  . BREAST BIOPSY Left 2012   neg/dr. brynett  . CESAREAN SECTION  2008   RPH  . TONSILLECTOMY  2006    There were no vitals filed for this visit.  Subjective Assessment - 05/01/18 1854    Subjective  Patient reports she has been performing her HEP. Patient states she would like to perform her 46min walk test. Patient states no current changes otherwise.     Pertinent History  Patient reports history of falls x3 in the past 6 months secondary to drop foot on the L LE. PMH: Multiple sclerosis; elbow fracture of the R UE    Limitations  Standing;Walking    How long can you stand comfortably?  20 min    How long can you walk comfortably?  1 miles     Patient Stated Goals  To be more balanced and core strengthening     Currently in Pain?  No/denies    Pain Onset  More than a month ago       TREATMENT  THERAPEUTIC EXERCISE  Single leg stance with eyes closed: x4 by 15 sec  Pro-Stretch in standing - x 2  min Sit to stands with TRX with heel strike - x 20  Semi-Circular motions with furniture slider - x 20 with UE support Ambulation around gym with focus on improving speed - x1448ft 4-square jumping in ccw and cw motions - x 3 (4 jumps each direction) Feet together ball toss on airex pad - x 20     74minWT:1450ft  Therapeutic exercises to improve LE strength and to improve balance. Pt continues to demonstrate fatigue in between sets and in between exercises.   PT Education - 05/01/18 1858    Education Details  form/technique with exercise.    Person(s) Educated  Patient    Methods  Explanation;Demonstration    Comprehension  Verbalized understanding;Returned demonstration          PT Long Term Goals - 04/30/18 0757      PT LONG TERM GOAL #1   Title  Patient will be independent with balance HEP to continue benefits of therapy after discharge.    Baseline  dependent with form/technique for balance exercise; 03/25/2018: Independent     Time  6    Period  Weeks  Status  Achieved      PT LONG TERM GOAL #2   Title  Patient will improve FGA by 4 points to indicate significant improvement with balancing while ambulating and decrease of fall risk    Baseline  03/25/2018: 28/30    Time  6    Period  Weeks    Status  Achieved      PT LONG TERM GOAL #3   Title  Patient will be able to perform prolonged single leg stance >10sec with eyes closed to indicate improvement with static balance and decrease fall risk    Baseline  2 sec in standing EC; 03/25/2018: 3 sec; 04/29/2018: 4 sec    Time  6    Period  Weeks    Status  On-going      PT LONG TERM GOAL #4   Title   Pt will improve 6 MWT by 100 m to demonstrate improved cardiorespiratory fitness    Baseline  Deferred to next session: 04/29/2018    Period  Weeks    Status  New    Target Date  06/10/18            Plan - 05/02/18 0755    Clinical Impression Statement  Patient demonstrates poor strength with performing sit to  stand motions with inability to perform bending motions in single leg positions. However, patient demonstrates improvement with overall hopping ability, demonstrating improvement with ability to perform hopping in the four main directions without use of UEs. Patient will benefit from further skilled therapy to further improve power and return to prior level of function.     Rehab Potential  Good    Clinical Impairments Affecting Rehab Potential  (-) hx of MS; (+) good motivation    PT Frequency  2x / week    PT Duration  6 weeks    PT Treatment/Interventions  Moist Heat;Iontophoresis 4mg /ml Dexamethasone;Electrical Stimulation;Aquatic Therapy;Cryotherapy;Therapeutic activities;Neuromuscular re-education;Therapeutic exercise;Patient/family education;Dry needling;Manual techniques;Passive range of motion;Balance training;Functional mobility training;Gait training;Stair training;Joint Manipulations    PT Next Visit Plan  Continue addressing balance    PT Home Exercise Plan  See education    Consulted and Agree with Plan of Care  Patient       Patient will benefit from skilled therapeutic intervention in order to improve the following deficits and impairments:  Pain, Decreased coordination, Impaired perceived functional ability, Increased fascial restricitons, Increased muscle spasms, Difficulty walking, Abnormal gait, Decreased balance, Decreased mobility, Impaired sensation  Visit Diagnosis: Difficulty in walking, not elsewhere classified  History of falling     Problem List There are no active problems to display for this patient.   [name], PT DPT 05/02/2018, 9:10 AM  [facility] [address] [place], [address] Phone: [phone]   Fax:  [phone]  Name: Michelle Ruiz MRN: [id number] Date of Birth: 13-Jan-1971

## 2018-05-06 ENCOUNTER — Ambulatory Visit: Payer: BLUE CROSS/BLUE SHIELD

## 2018-05-06 DIAGNOSIS — R262 Difficulty in walking, not elsewhere classified: Secondary | ICD-10-CM | POA: Diagnosis not present

## 2018-05-06 DIAGNOSIS — Z9181 History of falling: Secondary | ICD-10-CM

## 2018-05-07 NOTE — Therapy (Signed)
[facility] [address] [place], [address] Phone: [phone]   Fax:  [phone]  Physical Therapy Treatment  Patient Details  Name: Michelle Ruiz MRN: [id number] Date of Birth: 06-Feb-1971 Referring Provider (PT): Darrin Luis MD   Encounter Date: 05/06/2018  PT End of Session - 05/07/18 0740    Visit Number  16    Number of Visits  20    Date for PT Re-Evaluation  06/10/18    Authorization Type  BCBS    PT Start Time  1800    PT Stop Time  1845    PT Time Calculation (min)  45 min    Activity Tolerance  Patient tolerated treatment well    Behavior During Therapy  [facility] for tasks assessed/performed       Past Medical History:  Diagnosis Date  . Abnormal Pap smear of cervix   . Anxiety   . Kidney stones   . MS (multiple sclerosis) ([place])   . UTI (urinary tract infection)     Past Surgical History:  Procedure Laterality Date  . BREAST BIOPSY Right 2010   stereotactic biopsy/ neg/ dr.byrnett  . BREAST BIOPSY Left 2012   neg/dr. brynett  . CESAREAN SECTION  2008   RPH  . TONSILLECTOMY  2006    There were no vitals filed for this visit.  Subjective Assessment - 05/06/18 1834    Subjective  Patient reports she has been performing her HEP. Patient states her husband is worried about her walking her dogs by herself.     Pertinent History  Patient reports history of falls x3 in the past 6 months secondary to drop foot on the L LE. PMH: Multiple sclerosis; elbow fracture of the R UE    Limitations  Standing;Walking    How long can you stand comfortably?  20 min    How long can you walk comfortably?  1 miles     Patient Stated Goals  To be more balanced and core strengthening     Currently in Pain?  No/denies    Pain Onset  More than a month ago       TREATMENT Therapeutic Exercise  Sit to stands - 2 x 10 with arms straight Side stepping with RTB around ankles - x 5 down and back on airex beam   Balance stones standing with ball toss at rebounder - 2 x 10 with 4# Stepping forward in mini squats - 6 x 24ft with RTB around ankles Running man off of airex beam - x 15 with unilateral support Lateral lunges in standing with UE support - x 15  Ball toss in standing with performing prolonged standing on airex pad - x 20  Patient requires unilateral support to perform most exercises in standing. Patient requires tactile and verbal cueing for limb progression to activate appropriate hip musculature in standing.    PT Education - 05/07/18 0739    Education Details  form/technique with exercise; walking safely with dog    Person(s) Educated  Patient    Methods  Demonstration;Explanation    Comprehension  Verbalized understanding;Returned demonstration          PT Long Term Goals - 05/02/18 0914      PT LONG TERM GOAL #1   Title  Patient will be independent with balance HEP to continue benefits of therapy after discharge.    Baseline  dependent with form/technique for balance exercise; 03/25/2018: Independent  Time  6    Period  Weeks    Status  Achieved      PT LONG TERM GOAL #2   Title  Patient will improve FGA by 4 points to indicate significant improvement with balancing while ambulating and decrease of fall risk    Baseline  03/25/2018: 28/30    Time  6    Period  Weeks    Status  Achieved      PT LONG TERM GOAL #3   Title  Patient will be able to perform prolonged single leg stance >10sec with eyes closed to indicate improvement with static balance and decrease fall risk    Baseline  2 sec in standing EC; 03/25/2018: 3 sec; 04/29/2018: 4 sec    Time  6    Period  Weeks    Status  On-going      PT LONG TERM GOAL #4   Title   Pt will improve 6 MWT by 100 m (143ft) to demonstrate improved cardiorespiratory fitness    Baseline  Deferred to next session: 04/29/2018; 05/01/2018: 1425ft    Period  Weeks    Status  New            Plan - 05/07/18 0740    Clinical  Impression Statement  Patient demonstrates early onset of fatigue with exercises today and requires increased sitting rest break compared to previous sessions. However, patient was able to tolerate greater amount  exercises performed unilaterally indicating improvement in overall strength. Patient continues to demonstrate decreased foot clearane with ambulation, especially when fatigued, leading to fall risk and patient will benefit from further skilled therapy to return to prior level of function.     Rehab Potential  Good    Clinical Impairments Affecting Rehab Potential  (-) hx of MS; (+) good motivation    PT Frequency  2x / week    PT Duration  6 weeks    PT Treatment/Interventions  Moist Heat;Iontophoresis 4mg /ml Dexamethasone;Electrical Stimulation;Aquatic Therapy;Cryotherapy;Therapeutic activities;Neuromuscular re-education;Therapeutic exercise;Patient/family education;Dry needling;Manual techniques;Passive range of motion;Balance training;Functional mobility training;Gait training;Stair training;Joint Manipulations    PT Next Visit Plan  Continue addressing balance    PT Home Exercise Plan  See education    Consulted and Agree with Plan of Care  Patient       Patient will benefit from skilled therapeutic intervention in order to improve the following deficits and impairments:  Pain, Decreased coordination, Impaired perceived functional ability, Increased fascial restricitons, Increased muscle spasms, Difficulty walking, Abnormal gait, Decreased balance, Decreased mobility, Impaired sensation  Visit Diagnosis: Difficulty in walking, not elsewhere classified  History of falling     Problem List There are no active problems to display for this patient.   [name], PT DPT 05/07/2018, 7:44 AM  [facility] [address], [place], [address] Phone: [phone]   Fax:  [phone]  Name: Michelle Ruiz MRN: [id number] Date of Birth: 17-Sep-1970

## 2018-05-08 ENCOUNTER — Ambulatory Visit: Payer: BLUE CROSS/BLUE SHIELD

## 2018-05-08 DIAGNOSIS — Z9181 History of falling: Secondary | ICD-10-CM

## 2018-05-08 DIAGNOSIS — R262 Difficulty in walking, not elsewhere classified: Secondary | ICD-10-CM

## 2018-05-08 NOTE — Therapy (Signed)
[facility] [address], [place], [address] Phone: [phone]   Fax:  [phone]  Physical Therapy Treatment  Patient Details  Name: Michelle Ruiz MRN: [id number] Date of Birth: 08/10/1970 Referring Provider (PT): Darrin Luis MD   Encounter Date: 05/08/2018  PT End of Session - 05/08/18 1730    Visit Number  17    Number of Visits  20    Date for PT Re-Evaluation  06/10/18    Authorization Type  BCBS    PT Start Time  7253    PT Stop Time  1800    PT Time Calculation (min)  45 min    Activity Tolerance  Patient tolerated treatment well    Behavior During Therapy  [facility] for tasks assessed/performed       Past Medical History:  Diagnosis Date  . Abnormal Pap smear of cervix   . Anxiety   . Kidney stones   . MS (multiple sclerosis) ([place])   . UTI (urinary tract infection)     Past Surgical History:  Procedure Laterality Date  . BREAST BIOPSY Right 2010   stereotactic biopsy/ neg/ dr.byrnett  . BREAST BIOPSY Left 2012   neg/dr. brynett  . CESAREAN SECTION  2008   RPH  . TONSILLECTOMY  2006    There were no vitals filed for this visit.  Subjective Assessment - 05/08/18 1721    Subjective  Pt reports performing HEP regularly but did not perform yesterday. No reports of soreness from previous session.     Pertinent History  Patient reports history of falls x3 in the past 6 months secondary to drop foot on the L LE. PMH: Multiple sclerosis; elbow fracture of the R UE    Limitations  Standing;Walking    How long can you stand comfortably?  20 min    How long can you walk comfortably?  1 miles     Patient Stated Goals  To be more balanced and core strengthening     Currently in Pain?  No/denies    Pain Score  0-No pain    Pain Onset  More than a month ago     TREATMENT  THERAPEUTIC EXERCISE  B hip abduction machine: 2x15, 40 lbs,  B hip extension machine: 2x12, 70 lbs  4 square TB (Blue)  clockwise/counterclockwise: 1x5 rotations CW/CCW  Obstacle course to challenge balance: x4   Tandem Walking across 2x4 plank/ Side-stepping (2x)   Side-stepping across airex beam/ tandem stance on beam (2x)   SLS on airexbeam with Y balance testing touching each balance stone (3)   Side-stepping over hurdles (2)     Body squats with med ball: 2x20, 3.3 KG  Standing on airex pad catching med ball (1 KG) using it as perturbation to challenge balance side to side and overhead: 4 min   Therapeutic exercises to target pt's balance deficits. LE resistance exercises to improve LE strength and endurance to decrease pt's risk of falls and improve ADL's. Pt displayed fatigue after session. Min CGA with Y balance on balance stones.  PT Education - 05/08/18 1730    Education Details  Form/technique with exercise    Person(s) Educated  Patient    Methods  Demonstration;Explanation    Comprehension  Verbalized understanding;Returned demonstration          PT Long Term Goals - 05/02/18 0914      PT LONG TERM GOAL #1   Title  Patient will  be independent with balance HEP to continue benefits of therapy after discharge.    Baseline  dependent with form/technique for balance exercise; 03/25/2018: Independent     Time  6    Period  Weeks    Status  Achieved      PT LONG TERM GOAL #2   Title  Patient will improve FGA by 4 points to indicate significant improvement with balancing while ambulating and decrease of fall risk    Baseline  03/25/2018: 28/30    Time  6    Period  Weeks    Status  Achieved      PT LONG TERM GOAL #3   Title  Patient will be able to perform prolonged single leg stance >10sec with eyes closed to indicate improvement with static balance and decrease fall risk    Baseline  2 sec in standing EC; 03/25/2018: 3 sec; 04/29/2018: 4 sec    Time  6    Period  Weeks    Status  On-going      PT LONG TERM GOAL #4   Title   Pt will improve 6 MWT by 100 m (129ft) to demonstrate  improved cardiorespiratory fitness    Baseline  Deferred to next session: 04/29/2018; 05/01/2018: 1462ft    Period  Weeks    Status  New            Plan - 05/08/18 1804    Clinical Impression Statement  Pt was able to tolerate all resistance and balancing exercises with minimal resting between sessions compared to previous sessions. However pt tolerated an increase in amount of resistance exercises compared to previous sessions displaying long term carryover from previous treatment. Pt coninues to display LE weakness, decreaesd balance, and fatigue with exercises. Pt can benefit from skilled Pt treatment to further imrpove these impairments and return to PLOF.    Rehab Potential  Good    Clinical Impairments Affecting Rehab Potential  (-) hx of MS; (+) good motivation    PT Frequency  2x / week    PT Duration  6 weeks    PT Treatment/Interventions  Moist Heat;Iontophoresis 4mg /ml Dexamethasone;Electrical Stimulation;Aquatic Therapy;Cryotherapy;Therapeutic activities;Neuromuscular re-education;Therapeutic exercise;Patient/family education;Dry needling;Manual techniques;Passive range of motion;Balance training;Functional mobility training;Gait training;Stair training;Joint Manipulations    PT Next Visit Plan  Continue addressing balance    PT Home Exercise Plan  See education    Consulted and Agree with Plan of Care  Patient       Patient will benefit from skilled therapeutic intervention in order to improve the following deficits and impairments:  Pain, Decreased coordination, Impaired perceived functional ability, Increased fascial restricitons, Increased muscle spasms, Difficulty walking, Abnormal gait, Decreased balance, Decreased mobility, Impaired sensation  Visit Diagnosis: Difficulty in walking, not elsewhere classified  History of falling     Problem List There are no active problems to display for this patient.   Rachael Fee, SPT 05/08/2018, 6:12 PM  [facility] [address] [place], [address] Phone: [phone]   Fax:  [phone]  Name: Michelle Ruiz MRN: [id number] Date of Birth: [birth year]

## 2018-05-09 DIAGNOSIS — L570 Actinic keratosis: Secondary | ICD-10-CM | POA: Diagnosis not present

## 2018-05-13 ENCOUNTER — Ambulatory Visit: Payer: BLUE CROSS/BLUE SHIELD

## 2018-05-13 DIAGNOSIS — R262 Difficulty in walking, not elsewhere classified: Secondary | ICD-10-CM

## 2018-05-13 DIAGNOSIS — Z9181 History of falling: Secondary | ICD-10-CM

## 2018-05-14 NOTE — Therapy (Signed)
[facility] [address], [place], [address] Phone: [phone]   Fax:  [phone]  Physical Therapy Treatment  Patient Details  Name: Michelle Ruiz MRN: [id number] Date of Birth: 1970-05-06 Referring Provider (PT): Darrin Luis MD   Encounter Date: 05/13/2018  PT End of Session - 05/14/18 0852    Visit Number  18    Number of Visits  20    Date for PT Re-Evaluation  06/10/18    Authorization Type  BCBS    PT Start Time  1815    PT Stop Time  1900    PT Time Calculation (min)  45 min    Activity Tolerance  Patient tolerated treatment well    Behavior During Therapy  [facility] for tasks assessed/performed       Past Medical History:  Diagnosis Date  . Abnormal Pap smear of cervix   . Anxiety   . Kidney stones   . MS (multiple sclerosis) ([place])   . UTI (urinary tract infection)     Past Surgical History:  Procedure Laterality Date  . BREAST BIOPSY Right 2010   stereotactic biopsy/ neg/ dr.byrnett  . BREAST BIOPSY Left 2012   neg/dr. brynett  . CESAREAN SECTION  2008   RPH  . TONSILLECTOMY  2006    There were no vitals filed for this visit.  Subjective Assessment - 05/14/18 0846    Subjective  Patient states no major changes since the previous session. Patient reports she has had no fall since starting therapy and has conitnued to ambulate.     Pertinent History  Patient reports history of falls x3 in the past 6 months secondary to drop foot on the L LE. PMH: Multiple sclerosis; elbow fracture of the R UE    Limitations  Standing;Walking    How long can you stand comfortably?  20 min    How long can you walk comfortably?  1 miles     Patient Stated Goals  To be more balanced and core strengthening     Currently in Pain?  No/denies    Pain Onset  More than a month ago       TREATMENT  THERAPEUTIC EXERCISE             B hip abduction machine: 2x15, 40 lbs,             B hip extension machine:  2x15, 70 lbs  Marches on large dynadiscs without UE support - x 20  Body squats with large physioball - x 15 on wall   Hops over semicircle foam roller - x 10  Standing on airex pad catching med ball using it as perturbation to challenge balance side to side and overhead: 4 min     Therapeutic exercises to target pt's balance deficits. LE resistance exercises to improve LE strength and endurance to decrease pt's risk of falls and improve ADL's. Pt displayed fatigue after session. Min CGA with Y balance on balance stones.   PT Education - 05/14/18 0850    Education Details  form/technique with exercise; continue to perform ambulation at home for exercise    Person(s) Educated  Patient    Methods  Explanation;Demonstration    Comprehension  Verbalized understanding;Returned demonstration          PT Long Term Goals - 05/02/18 0914      PT LONG TERM GOAL #1   Title  Patient will be independent with balance  HEP to continue benefits of therapy after discharge.    Baseline  dependent with form/technique for balance exercise; 03/25/2018: Independent     Time  6    Period  Weeks    Status  Achieved      PT LONG TERM GOAL #2   Title  Patient will improve FGA by 4 points to indicate significant improvement with balancing while ambulating and decrease of fall risk    Baseline  03/25/2018: 28/30    Time  6    Period  Weeks    Status  Achieved      PT LONG TERM GOAL #3   Title  Patient will be able to perform prolonged single leg stance >10sec with eyes closed to indicate improvement with static balance and decrease fall risk    Baseline  2 sec in standing EC; 03/25/2018: 3 sec; 04/29/2018: 4 sec    Time  6    Period  Weeks    Status  On-going      PT LONG TERM GOAL #4   Title   Pt will improve 6 MWT by 100 m (152ft) to demonstrate improved cardiorespiratory fitness    Baseline  Deferred to next session: 04/29/2018; 05/01/2018: 1423ft    Period  Weeks    Status  New             Plan - 05/14/18 0901    Clinical Impression Statement  Patient demonstrates overall improvement with ability to perform a forward hop of 31in compard to previous session with inability to hop forward. Although patient is improving, she continues to have decresaed balance with performing activites requiring narrow BOS. Patient will benefit from further skilled therapy focused on improving limitations to return to prior level of function.     Rehab Potential  Good    Clinical Impairments Affecting Rehab Potential  (-) hx of MS; (+) good motivation    PT Frequency  2x / week    PT Duration  6 weeks    PT Treatment/Interventions  Moist Heat;Iontophoresis 4mg /ml Dexamethasone;Electrical Stimulation;Aquatic Therapy;Cryotherapy;Therapeutic activities;Neuromuscular re-education;Therapeutic exercise;Patient/family education;Dry needling;Manual techniques;Passive range of motion;Balance training;Functional mobility training;Gait training;Stair training;Joint Manipulations    PT Next Visit Plan  Continue addressing balance    PT Home Exercise Plan  See education    Consulted and Agree with Plan of Care  Patient       Patient will benefit from skilled therapeutic intervention in order to improve the following deficits and impairments:  Pain, Decreased coordination, Impaired perceived functional ability, Increased fascial restricitons, Increased muscle spasms, Difficulty walking, Abnormal gait, Decreased balance, Decreased mobility, Impaired sensation  Visit Diagnosis: Difficulty in walking, not elsewhere classified  History of falling     Problem List There are no active problems to display for this patient.   [name], PT DPT 05/14/2018, 9:10 AM  [facility] [address] [place], [address] Phone: [phone]   Fax:  [phone]  Name: Michelle Ruiz MRN: [id number] Date of Birth: 1970/07/30

## 2018-05-15 ENCOUNTER — Ambulatory Visit: Payer: BLUE CROSS/BLUE SHIELD

## 2018-05-15 DIAGNOSIS — Z9181 History of falling: Secondary | ICD-10-CM

## 2018-05-15 DIAGNOSIS — R262 Difficulty in walking, not elsewhere classified: Secondary | ICD-10-CM | POA: Diagnosis not present

## 2018-05-15 NOTE — Therapy (Signed)
[facility] [address], [place], [address] Phone: [phone]   Fax:  [phone]  Physical Therapy Treatment  Patient Details  Name: Michelle Ruiz MRN: [id number] Date of Birth: 1970-05-02 Referring Provider (PT): Darrin Luis MD   Encounter Date: 05/15/2018  PT End of Session - 05/15/18 1745    Visit Number  19    Number of Visits  20    Date for PT Re-Evaluation  06/10/18    Authorization Type  BCBS    PT Start Time  9323    PT Stop Time  1800    PT Time Calculation (min)  45 min    Activity Tolerance  Patient tolerated treatment well    Behavior During Therapy  [facility] for tasks assessed/performed       Past Medical History:  Diagnosis Date  . Abnormal Pap smear of cervix   . Anxiety   . Kidney stones   . MS (multiple sclerosis) ([place])   . UTI (urinary tract infection)     Past Surgical History:  Procedure Laterality Date  . BREAST BIOPSY Right 2010   stereotactic biopsy/ neg/ dr.byrnett  . BREAST BIOPSY Left 2012   neg/dr. brynett  . CESAREAN SECTION  2008   RPH  . TONSILLECTOMY  2006    There were no vitals filed for this visit.  Subjective Assessment - 05/15/18 1717    Subjective  Pt reports "nothing new" since previous session. Patient reports she has difficulty with ambulation but has noticed improvement in her fall risk.     Patient is accompained by:  Family member    Pertinent History  Patient reports history of falls x3 in the past 6 months secondary to drop foot on the L LE. PMH: Multiple sclerosis; elbow fracture of the R UE    Limitations  Standing;Walking    How long can you stand comfortably?  20 min    How long can you walk comfortably?  1 miles     Patient Stated Goals  To be more balanced and core strengthening     Currently in Pain?  No/denies    Pain Onset  More than a month ago     TREATMENT THERAPEUTIC EXERCISE  Hopping:    B LE Frontal plane over line: 1x10 per  leg    B LE Sagittal plane over half bolster: 1x10 forward hopping B hip abduction machine: 1x20, 40 lbs   B hip extension machine: 1x20, 70 lbs  Sit-to-stands with airex pad under feet holding med ball: 1 kg, 2x10  Standing on airex pad catching 1kg med ball using it as perturbation to challenge balance side to side and overhead: 4 min   B Standing on one leg sundial: x2 per leg  Therapeutic exercises to improve LE strength and to challenge pt's balance to improve community ambulation and to decrease risk of falls. Pt continues to have difficulty with single leg stance. Pt displays LE fatigue after session.  PT Education - 05/15/18 1744    Education Details  Form/technique with exercise. Pain science education throughout session.    Person(s) Educated  Patient    Methods  Explanation;Demonstration    Comprehension  Verbalized understanding;Returned demonstration          PT Long Term Goals - 05/02/18 0914      PT LONG TERM GOAL #1   Title  Patient will be independent with balance HEP to continue benefits of therapy  after discharge.    Baseline  dependent with form/technique for balance exercise; 03/25/2018: Independent     Time  6    Period  Weeks    Status  Achieved      PT LONG TERM GOAL #2   Title  Patient will improve FGA by 4 points to indicate significant improvement with balancing while ambulating and decrease of fall risk    Baseline  03/25/2018: 28/30    Time  6    Period  Weeks    Status  Achieved      PT LONG TERM GOAL #3   Title  Patient will be able to perform prolonged single leg stance >10sec with eyes closed to indicate improvement with static balance and decrease fall risk    Baseline  2 sec in standing EC; 03/25/2018: 3 sec; 04/29/2018: 4 sec    Time  6    Period  Weeks    Status  On-going      PT LONG TERM GOAL #4   Title   Pt will improve 6 MWT by 100 m (175ft) to demonstrate improved cardiorespiratory fitness    Baseline  Deferred to next session:  04/29/2018; 05/01/2018: 1433ft    Period  Weeks    Status  New            Plan - 05/15/18 1842    Clinical Impression Statement  Pt was able to perform hopping forward and sideways with improved ability to performing stepping out strategies to maintain balance. Pt continuse to display decreased balance with decreased BOS and displays LE muscle weakness and fatigue. Pt can continue to benefit from skilled treatment to return to PLOF.    Rehab Potential  Good    Clinical Impairments Affecting Rehab Potential  (-) hx of MS; (+) good motivation    PT Frequency  2x / week    PT Duration  6 weeks    PT Treatment/Interventions  Moist Heat;Iontophoresis 4mg /ml Dexamethasone;Electrical Stimulation;Aquatic Therapy;Cryotherapy;Therapeutic activities;Neuromuscular re-education;Therapeutic exercise;Patient/family education;Dry needling;Manual techniques;Passive range of motion;Balance training;Functional mobility training;Gait training;Stair training;Joint Manipulations    PT Next Visit Plan  Continue addressing balance    PT Home Exercise Plan  See education    Consulted and Agree with Plan of Care  Patient       Patient will benefit from skilled therapeutic intervention in order to improve the following deficits and impairments:  Pain, Decreased coordination, Impaired perceived functional ability, Increased fascial restricitons, Increased muscle spasms, Difficulty walking, Abnormal gait, Decreased balance, Decreased mobility, Impaired sensation  Visit Diagnosis: Difficulty in walking, not elsewhere classified  History of falling     Problem List There are no active problems to display for this patient.   Rachael Fee, SPT 05/16/2018, 8:54 AM  [facility] [address], [place], [address] Phone: [phone]   Fax:  [phone]  Name: Michelle Ruiz MRN: [id number] Date of Birth: 10-25-1970

## 2018-05-20 ENCOUNTER — Ambulatory Visit: Payer: BLUE CROSS/BLUE SHIELD

## 2018-05-20 DIAGNOSIS — R262 Difficulty in walking, not elsewhere classified: Secondary | ICD-10-CM | POA: Diagnosis not present

## 2018-05-20 DIAGNOSIS — Z9181 History of falling: Secondary | ICD-10-CM | POA: Diagnosis not present

## 2018-05-20 NOTE — Therapy (Signed)
[facility] [address], [place], [address] Phone: [phone]   Fax:  [phone]  Physical Therapy Treatment  Patient Details  Name: Michelle Ruiz MRN: [id number] Date of Birth: [birth year] Referring Provider (PT): Darrin Luis MD   Encounter Date: 05/20/2018  PT End of Session - 05/20/18 1850    Visit Number  20    Number of Visits  20    Date for PT Re-Evaluation  06/10/18    Authorization Type  BCBS    PT Start Time  1800    PT Stop Time  1845    PT Time Calculation (min)  45 min    Activity Tolerance  Patient tolerated treatment well    Behavior During Therapy  [facility] for tasks assessed/performed       Past Medical History:  Diagnosis Date  . Abnormal Pap smear of cervix   . Anxiety   . Kidney stones   . MS (multiple sclerosis) ([place])   . UTI (urinary tract infection)     Past Surgical History:  Procedure Laterality Date  . BREAST BIOPSY Right 2010   stereotactic biopsy/ neg/ dr.byrnett  . BREAST BIOPSY Left 2012   neg/dr. brynett  . CESAREAN SECTION  2008   RPH  . TONSILLECTOMY  2006    There were no vitals filed for this visit.  Subjective Assessment - 05/20/18 1822    Subjective  Pt reports soreness from previous session. Pt states continuing having difficulty with hopping acitivites.    Patient is accompained by:  Family member    Pertinent History  Patient reports history of falls x3 in the past 6 months secondary to drop foot on the L LE. PMH: Multiple sclerosis; elbow fracture of the R UE    Limitations  Standing;Walking    How long can you stand comfortably?  20 min    How long can you walk comfortably?  1 miles     Patient Stated Goals  To be more balanced and core strengthening     Currently in Pain?  No/denies    Pain Onset  More than a month ago     TREATMENT  THERAPEUTIC EXERCISE  Sit to stands with med ball: 2x10, 3kg  B hip abduction machine: 2x8, 40 lbs  Hopping  forward focusing on power production and eccentric control: 1x12   Longest distance: 40"  Side-hopping over line: 1x10 per leg  SLS on airex pad tapping cones: 1x20 B, 13 cones  B 8" step-ups with contralateral hip flexion to improve SLS: 2x12    Therapeutic exercise focusing on single leg balance to decrease risk of falls and to improve ADL's requiring single leg balance such as ascending and descending stairs. LE strengthening to improve function with ADL's and improved muscular endurance to tolerate longer bouts of activity. Pt displayed muscular fatigue after session.  PT Education - 05/20/18 1849    Education Details  Form/technique with exercise. Educated on motorunit recruitment responsible for inital strength gains with exercise.    Person(s) Educated  Patient    Methods  Explanation;Demonstration    Comprehension  Verbalized understanding;Returned demonstration          PT Long Term Goals - 05/02/18 0914      PT LONG TERM GOAL #1   Title  Patient will be independent with balance HEP to continue benefits of therapy after discharge.    Baseline  dependent with form/technique for balance exercise; 03/25/2018:  Independent     Time  6    Period  Weeks    Status  Achieved      PT LONG TERM GOAL #2   Title  Patient will improve FGA by 4 points to indicate significant improvement with balancing while ambulating and decrease of fall risk    Baseline  03/25/2018: 28/30    Time  6    Period  Weeks    Status  Achieved      PT LONG TERM GOAL #3   Title  Patient will be able to perform prolonged single leg stance >10sec with eyes closed to indicate improvement with static balance and decrease fall risk    Baseline  2 sec in standing EC; 03/25/2018: 3 sec; 04/29/2018: 4 sec    Time  6    Period  Weeks    Status  On-going      PT LONG TERM GOAL #4   Title   Pt will improve 6 MWT by 100 m (16ft) to demonstrate improved cardiorespiratory fitness    Baseline  Deferred to next  session: 04/29/2018; 05/01/2018: 1442ft    Period  Weeks    Status  New            Plan - 05/20/18 1850    Clinical Impression Statement  pt was able to forward hop 40" compared to 31" previously displaying long term carryover in power production and LE strength. Pt continues to have difficulty with balancing on one leg requiring SBA. Pt also displays increased muscular fatigue and LE weakness. Pt can continue to benefit from skilled PT treament to return to PLOF.    Rehab Potential  Good    Clinical Impairments Affecting Rehab Potential  (-) hx of MS; (+) good motivation    PT Frequency  2x / week    PT Duration  6 weeks    PT Treatment/Interventions  Moist Heat;Iontophoresis 4mg /ml Dexamethasone;Electrical Stimulation;Aquatic Therapy;Cryotherapy;Therapeutic activities;Neuromuscular re-education;Therapeutic exercise;Patient/family education;Dry needling;Manual techniques;Passive range of motion;Balance training;Functional mobility training;Gait training;Stair training;Joint Manipulations    PT Next Visit Plan  Continue addressing balance    PT Home Exercise Plan  See education    Consulted and Agree with Plan of Care  Patient       Patient will benefit from skilled therapeutic intervention in order to improve the following deficits and impairments:  Pain, Decreased coordination, Impaired perceived functional ability, Increased fascial restricitons, Increased muscle spasms, Difficulty walking, Abnormal gait, Decreased balance, Decreased mobility, Impaired sensation  Visit Diagnosis: History of falling  Difficulty in walking, not elsewhere classified     Problem List There are no active problems to display for this patient.   Rachael Fee, SPT 05/20/2018, 6:53 PM  [facility] [address], [place], [address] Phone: [phone]   Fax:  [phone]  Name: Michelle Ruiz MRN: [id number] Date of Birth:  Oct 09, 1970

## 2018-05-22 ENCOUNTER — Ambulatory Visit: Payer: BLUE CROSS/BLUE SHIELD

## 2018-05-22 DIAGNOSIS — R262 Difficulty in walking, not elsewhere classified: Secondary | ICD-10-CM | POA: Diagnosis not present

## 2018-05-22 DIAGNOSIS — Z9181 History of falling: Secondary | ICD-10-CM | POA: Diagnosis not present

## 2018-05-22 NOTE — Therapy (Signed)
[facility] [address], [place], [address] Phone: [phone]   Fax:  [phone]  Physical Therapy Treatment  Patient Details  Name: LENNI RECKNER MRN: [id number] Date of Birth: [birth year] Referring Provider (PT): Darrin Luis MD   Encounter Date: 05/22/2018  PT End of Session - 05/22/18 1732    Visit Number  21    Number of Visits  26    Date for PT Re-Evaluation  06/10/18    Authorization Type  BCBS    PT Start Time  8250    PT Stop Time  1800    PT Time Calculation (min)  45 min    Activity Tolerance  Patient tolerated treatment well    Behavior During Therapy  [facility] for tasks assessed/performed       Past Medical History:  Diagnosis Date  . Abnormal Pap smear of cervix   . Anxiety   . Kidney stones   . MS (multiple sclerosis) ([place])   . UTI (urinary tract infection)     Past Surgical History:  Procedure Laterality Date  . BREAST BIOPSY Right 2010   stereotactic biopsy/ neg/ dr.byrnett  . BREAST BIOPSY Left 2012   neg/dr. brynett  . CESAREAN SECTION  2008   RPH  . TONSILLECTOMY  2006    There were no vitals filed for this visit.  Subjective Assessment - 05/22/18 1718    Subjective  Pt reports soreness from previous session. Pt states using exercise bike and walking. Pt states walking and biking has improved since starting PT treatment.    Patient is accompained by:  Family member    Pertinent History  Patient reports history of falls x3 in the past 6 months secondary to drop foot on the L LE. PMH: Multiple sclerosis; elbow fracture of the R UE    Limitations  Standing;Walking    How long can you stand comfortably?  20 min    How long can you walk comfortably?  1 miles     Patient Stated Goals  To be more balanced and core strengthening     Currently in Pain?  No/denies    Pain Onset  More than a month ago     TREATMENT  THERAPEUTIC EXERCISE             Body squat med ball wall toss:  2x10, 1 KG  B lunges with UE support: 2x10 B hip abduction machine: 2x12, 55 lbs             B Standing march: 1x15             B 8" step-ups with contralateral hip flexion to improve SLS: 2x12, 3 lbs ankle weights   Therapeutic exercises to address LE muscle weakness. Compound LE exercises focusing on improving power production to improve walking speed to reduce risk of fall. Pt displayed fatigue after session. Standing marches and 8" step-ups to improve single leg stance balance.   PT Education - 05/22/18 1731    Education Details  Form/technique with exercise.     Person(s) Educated  Patient    Methods  Explanation;Demonstration    Comprehension  Verbalized understanding;Returned demonstration          PT Long Term Goals - 05/02/18 0914      PT LONG TERM GOAL #1   Title  Patient will be independent with balance HEP to continue benefits of therapy after discharge.    Baseline  dependent  with form/technique for balance exercise; 03/25/2018: Independent     Time  6    Period  Weeks    Status  Achieved      PT LONG TERM GOAL #2   Title  Patient will improve FGA by 4 points to indicate significant improvement with balancing while ambulating and decrease of fall risk    Baseline  03/25/2018: 28/30    Time  6    Period  Weeks    Status  Achieved      PT LONG TERM GOAL #3   Title  Patient will be able to perform prolonged single leg stance >10sec with eyes closed to indicate improvement with static balance and decrease fall risk    Baseline  2 sec in standing EC; 03/25/2018: 3 sec; 04/29/2018: 4 sec    Time  6    Period  Weeks    Status  On-going      PT LONG TERM GOAL #4   Title   Pt will improve 6 MWT by 100 m (14ft) to demonstrate improved cardiorespiratory fitness    Baseline  Deferred to next session: 04/29/2018; 05/01/2018: 1481ft    Period  Weeks    Status  New            Plan - 05/22/18 1805    Clinical Impression Statement  Pt required decreased rest breaks  between sets of resistance exercises. Pt able to increase resistance with 8" step ups displaying improved LE strength. Pt continues to have difficulty with single leg balance and displays LE weakness therefore pt can continue to benefit from skilled PT treatment to return to PLOF.    Rehab Potential  Good    Clinical Impairments Affecting Rehab Potential  (-) hx of MS; (+) good motivation    PT Frequency  2x / week    PT Duration  6 weeks    PT Treatment/Interventions  Moist Heat;Iontophoresis 4mg /ml Dexamethasone;Electrical Stimulation;Aquatic Therapy;Cryotherapy;Therapeutic activities;Neuromuscular re-education;Therapeutic exercise;Patient/family education;Dry needling;Manual techniques;Passive range of motion;Balance training;Functional mobility training;Gait training;Stair training;Joint Manipulations    PT Next Visit Plan  Continue addressing balance    PT Home Exercise Plan  See education    Consulted and Agree with Plan of Care  Patient       Patient will benefit from skilled therapeutic intervention in order to improve the following deficits and impairments:  Pain, Decreased coordination, Impaired perceived functional ability, Increased fascial restricitons, Increased muscle spasms, Difficulty walking, Abnormal gait, Decreased balance, Decreased mobility, Impaired sensation  Visit Diagnosis: History of falling  Difficulty in walking, not elsewhere classified     Problem List There are no active problems to display for this patient.   Rachael Fee, SPT 05/22/2018, 6:08 PM  [facility] [address] [place], [address] Phone: [phone]   Fax:  [phone]  Name: VESTA WHEELAND MRN: [id number] Date of Birth: 1970-06-21

## 2018-05-27 ENCOUNTER — Ambulatory Visit: Payer: BLUE CROSS/BLUE SHIELD | Attending: Neurology

## 2018-05-27 DIAGNOSIS — F330 Major depressive disorder, recurrent, mild: Secondary | ICD-10-CM | POA: Diagnosis not present

## 2018-05-27 DIAGNOSIS — F411 Generalized anxiety disorder: Secondary | ICD-10-CM | POA: Diagnosis not present

## 2018-05-27 DIAGNOSIS — Z9181 History of falling: Secondary | ICD-10-CM | POA: Diagnosis not present

## 2018-05-27 DIAGNOSIS — F3281 Premenstrual dysphoric disorder: Secondary | ICD-10-CM | POA: Diagnosis not present

## 2018-05-27 DIAGNOSIS — R262 Difficulty in walking, not elsewhere classified: Secondary | ICD-10-CM | POA: Diagnosis not present

## 2018-05-27 NOTE — Therapy (Signed)
[facility] [address] [place], [address] Phone: [phone]   Fax:  [phone]  Physical Therapy Treatment  Patient Details  Name: Michelle Ruiz MRN: [id number] Date of Birth: 21-Jun-1970 Referring Provider (PT): Darrin Luis MD   Encounter Date: 05/27/2018  PT End of Session - 05/27/18 1747    Visit Number  22    Number of Visits  26    Date for PT Re-Evaluation  06/10/18    Authorization Type  BCBS    PT Start Time  8546    PT Stop Time  1800    PT Time Calculation (min)  45 min    Activity Tolerance  Patient tolerated treatment well    Behavior During Therapy  [facility] for tasks assessed/performed       Past Medical History:  Diagnosis Date  . Abnormal Pap smear of cervix   . Anxiety   . Kidney stones   . MS (multiple sclerosis) ([place])   . UTI (urinary tract infection)     Past Surgical History:  Procedure Laterality Date  . BREAST BIOPSY Right 2010   stereotactic biopsy/ neg/ dr.byrnett  . BREAST BIOPSY Left 2012   neg/dr. brynett  . CESAREAN SECTION  2008   RPH  . TONSILLECTOMY  2006    There were no vitals filed for this visit.  Subjective Assessment - 05/27/18 1730    Subjective  Patient reports no major changes and denies having any falls since the previous session.    Patient is accompained by:  Family member    Pertinent History  Patient reports history of falls x3 in the past 6 months secondary to drop foot on the L LE. PMH: Multiple sclerosis; elbow fracture of the R UE    Limitations  Standing;Walking    How long can you stand comfortably?  20 min    How long can you walk comfortably?  1 miles     Patient Stated Goals  To be more balanced and core strengthening     Currently in Pain?  No/denies    Pain Onset  More than a month ago         TREATMENT  THERAPEUTIC EXERCISE Step ups onto 8" without UE - x 20 B Body squat sit to stand: 2x10, 10# Standing ball toss on airex  pad - x 20 in semitandem stance to address static balance Standing Jumping for distance to address decrease in power - x 20 Hip hikes in standing off of airex pad - x 15 B hip abduction machine: x20, 55 lbs Running man in standing with foot slider - x 10    Therapeutic exercises to address LE muscle weakness. Compound LE exercises focusing on improving power production to improve walking speed to reduce risk of fall. Pt displayed fatigue after session. 8" step-ups to improve single leg stance balance.    PT Education - 05/27/18 1745    Education Details  form/technique with exercise    Person(s) Educated  Patient    Methods  Explanation;Demonstration    Comprehension  Verbalized understanding;Returned demonstration          PT Long Term Goals - 05/02/18 0914      PT LONG TERM GOAL #1   Title  Patient will be independent with balance HEP to continue benefits of therapy after discharge.    Baseline  dependent with form/technique for balance exercise; 03/25/2018: Independent     Time  6    Period  Weeks    Status  Achieved      PT LONG TERM GOAL #2   Title  Patient will improve FGA by 4 points to indicate significant improvement with balancing while ambulating and decrease of fall risk    Baseline  03/25/2018: 28/30    Time  6    Period  Weeks    Status  Achieved      PT LONG TERM GOAL #3   Title  Patient will be able to perform prolonged single leg stance >10sec with eyes closed to indicate improvement with static balance and decrease fall risk    Baseline  2 sec in standing EC; 03/25/2018: 3 sec; 04/29/2018: 4 sec    Time  6    Period  Weeks    Status  On-going      PT LONG TERM GOAL #4   Title   Pt will improve 6 MWT by 100 m (173ft) to demonstrate improved cardiorespiratory fitness    Baseline  Deferred to next session: 04/29/2018; 05/01/2018: 1414ft    Period  Weeks    Status  New            Plan - 05/27/18 1801    Clinical Impression Statement  Patient  demonstrates improvement with ability to perform same amount of jumping. Patient demonstrates ability to perform greater amount of hip strengthening compared to previous session and improved static balance ability on airex pad during ball toss. Patient continues to have decreased single leg stance indicating poor balance quality. Patient will benefit from further skilled therapy to return to prior level of function.     Rehab Potential  Good    Clinical Impairments Affecting Rehab Potential  (-) hx of MS; (+) good motivation    PT Frequency  2x / week    PT Duration  6 weeks    PT Treatment/Interventions  Moist Heat;Iontophoresis 4mg /ml Dexamethasone;Electrical Stimulation;Aquatic Therapy;Cryotherapy;Therapeutic activities;Neuromuscular re-education;Therapeutic exercise;Patient/family education;Dry needling;Manual techniques;Passive range of motion;Balance training;Functional mobility training;Gait training;Stair training;Joint Manipulations    PT Next Visit Plan  Continue addressing balance    PT Home Exercise Plan  See education    Consulted and Agree with Plan of Care  Patient       Patient will benefit from skilled therapeutic intervention in order to improve the following deficits and impairments:  Pain, Decreased coordination, Impaired perceived functional ability, Increased fascial restricitons, Increased muscle spasms, Difficulty walking, Abnormal gait, Decreased balance, Decreased mobility, Impaired sensation  Visit Diagnosis: History of falling  Difficulty in walking, not elsewhere classified     Problem List There are no active problems to display for this patient.   [name], PT DPT 05/27/2018, 6:13 PM  [facility] [address], [place], [address] Phone: [phone]   Fax:  [phone]  Name: KHAMRYN CALDERONE MRN: [id number] Date of Birth: 02-Apr-1970

## 2018-05-29 ENCOUNTER — Ambulatory Visit: Payer: BLUE CROSS/BLUE SHIELD

## 2018-05-29 DIAGNOSIS — R262 Difficulty in walking, not elsewhere classified: Secondary | ICD-10-CM

## 2018-05-29 DIAGNOSIS — Z9181 History of falling: Secondary | ICD-10-CM | POA: Diagnosis not present

## 2018-05-29 NOTE — Therapy (Signed)
[facility] [address] [place], [address] Phone: [phone]   Fax:  [phone]  Physical Therapy Treatment  Patient Details  Name: MATY ZEISLER MRN: [id number] Date of Birth: 1970/04/05 Referring Provider (PT): Darrin Luis MD   Encounter Date: 05/29/2018  PT End of Session - 05/29/18 1754    Visit Number  23    Number of Visits  26    Date for PT Re-Evaluation  06/10/18    Authorization Type  BCBS    PT Start Time  2505    PT Stop Time  1800    PT Time Calculation (min)  45 min    Activity Tolerance  Patient tolerated treatment well    Behavior During Therapy  [facility] for tasks assessed/performed       Past Medical History:  Diagnosis Date  . Abnormal Pap smear of cervix   . Anxiety   . Kidney stones   . MS (multiple sclerosis) ([place])   . UTI (urinary tract infection)     Past Surgical History:  Procedure Laterality Date  . BREAST BIOPSY Right 2010   stereotactic biopsy/ neg/ dr.byrnett  . BREAST BIOPSY Left 2012   neg/dr. brynett  . CESAREAN SECTION  2008   RPH  . TONSILLECTOMY  2006    There were no vitals filed for this visit.  Subjective Assessment - 05/29/18 1752    Subjective  Patient reports improvement overtime and states she has been able to wear boots which she has not been able to do.     Patient is accompained by:  Family member    Pertinent History  Patient reports history of falls x3 in the past 6 months secondary to drop foot on the L LE. PMH: Multiple sclerosis; elbow fracture of the R UE    Limitations  Standing;Walking    How long can you stand comfortably?  20 min    How long can you walk comfortably?  1 miles     Patient Stated Goals  To be more balanced and core strengthening     Currently in Pain?  No/denies    Pain Onset  More than a month ago       TREATMENT  THERAPEUTIC EXERCISE B hip abduction machine: x20, 55 lbs; x 20 40# Planks on treatment table with  arm raises - x 10, x5 Floor to standing transfers without external device use to address power - x 5  Body squat sit to stand: 2x10, 10# Step ups onto bosu without UE - x 12 B 4# Hip extension with 4# weights around ankles - x 20 Standing ball toss on airex pad - x 20 in semitandem stance to address static balance and with 4# weights around ankles   Therapeutic exercises to address LE muscle weakness. Compound LE exercises focusing on improving power production to improve walking speed to reduce risk of fall. Pt displayed fatigue after session. Addressed static and dyanamic balance with use of airex pad and use of bosu ball   PT Education - 05/29/18 1753    Education Details  form/technique with exercise    Person(s) Educated  Patient    Methods  Explanation;Demonstration    Comprehension  Verbalized understanding;Returned demonstration          PT Long Term Goals - 05/02/18 0914      PT LONG TERM GOAL #1   Title  Patient will be independent with balance HEP to continue  benefits of therapy after discharge.    Baseline  dependent with form/technique for balance exercise; 03/25/2018: Independent     Time  6    Period  Weeks    Status  Achieved      PT LONG TERM GOAL #2   Title  Patient will improve FGA by 4 points to indicate significant improvement with balancing while ambulating and decrease of fall risk    Baseline  03/25/2018: 28/30    Time  6    Period  Weeks    Status  Achieved      PT LONG TERM GOAL #3   Title  Patient will be able to perform prolonged single leg stance >10sec with eyes closed to indicate improvement with static balance and decrease fall risk    Baseline  2 sec in standing EC; 03/25/2018: 3 sec; 04/29/2018: 4 sec    Time  6    Period  Weeks    Status  On-going      PT LONG TERM GOAL #4   Title   Pt will improve 6 MWT by 100 m (120ft) to demonstrate improved cardiorespiratory fitness    Baseline  Deferred to next session: 04/29/2018; 05/01/2018: 1465ft     Period  Weeks    Status  New            Plan - 05/29/18 1754    Clinical Impression Statement  Patient reports limited ability to perform high stepping with heavy boots and performed weighted step ups to address this. Patient demosntrates difficulty with performing exercises with narrow base of support. Although patient continues to demonstrate difficulty, she is overall improving with dynamic balance and will benefit from further skilled therapy to return to prior level of function.     Rehab Potential  Good    Clinical Impairments Affecting Rehab Potential  (-) hx of MS; (+) good motivation    PT Frequency  2x / week    PT Duration  6 weeks    PT Treatment/Interventions  Moist Heat;Iontophoresis 4mg /ml Dexamethasone;Electrical Stimulation;Aquatic Therapy;Cryotherapy;Therapeutic activities;Neuromuscular re-education;Therapeutic exercise;Patient/family education;Dry needling;Manual techniques;Passive range of motion;Balance training;Functional mobility training;Gait training;Stair training;Joint Manipulations    PT Next Visit Plan  Continue addressing balance    PT Home Exercise Plan  See education    Consulted and Agree with Plan of Care  Patient       Patient will benefit from skilled therapeutic intervention in order to improve the following deficits and impairments:  Pain, Decreased coordination, Impaired perceived functional ability, Increased fascial restricitons, Increased muscle spasms, Difficulty walking, Abnormal gait, Decreased balance, Decreased mobility, Impaired sensation  Visit Diagnosis: History of falling  Difficulty in walking, not elsewhere classified     Problem List There are no active problems to display for this patient.   [name], PT DPT 05/29/2018, 6:08 PM  [place] [address], [place], [address] Phone: [phone]   Fax:  [phone]  Name: MADY OUBRE MRN:  [id number] Date of Birth: 1970-09-03

## 2018-06-03 ENCOUNTER — Ambulatory Visit: Payer: BLUE CROSS/BLUE SHIELD

## 2018-06-03 DIAGNOSIS — Z9181 History of falling: Secondary | ICD-10-CM

## 2018-06-03 DIAGNOSIS — R262 Difficulty in walking, not elsewhere classified: Secondary | ICD-10-CM | POA: Diagnosis not present

## 2018-06-03 NOTE — Therapy (Signed)
[facility] [address] [place], [address] Phone: [phone]   Fax:  [phone]  Physical Therapy Treatment  Patient Details  Name: Michelle Ruiz MRN: [id number] Date of Birth: [birth year] Referring Provider (PT): Darrin Luis MD   Encounter Date: 06/03/2018  PT End of Session - 06/03/18 1804    Visit Number  24    Number of Visits  26    Date for PT Re-Evaluation  06/10/18    Authorization Type  BCBS    PT Start Time  1884    PT Stop Time  1800    PT Time Calculation (min)  45 min    Activity Tolerance  Patient tolerated treatment well    Behavior During Therapy  [facility] for tasks assessed/performed       Past Medical History:  Diagnosis Date  . Abnormal Pap smear of cervix   . Anxiety   . Kidney stones   . MS (multiple sclerosis) ([place])   . UTI (urinary tract infection)     Past Surgical History:  Procedure Laterality Date  . BREAST BIOPSY Right 2010   stereotactic biopsy/ neg/ dr.byrnett  . BREAST BIOPSY Left 2012   neg/dr. brynett  . CESAREAN SECTION  2008   RPH  . TONSILLECTOMY  2006    There were no vitals filed for this visit.  Subjective Assessment - 06/03/18 1800    Subjective  Patient reports no major changes since the previous session. Patient states overall improvement in strength compared to previous sessions.     Patient is accompained by:  Family member    Pertinent History  Patient reports history of falls x3 in the past 6 months secondary to drop foot on the L LE. PMH: Multiple sclerosis; elbow fracture of the R UE    Limitations  Standing;Walking    How long can you stand comfortably?  20 min    How long can you walk comfortably?  1 miles     Patient Stated Goals  To be more balanced and core strengthening     Currently in Pain?  No/denies    Pain Onset  More than a month ago       TREATMENT  THERAPEUTIC EXERCISE Running man off of airex pad - x 20 Hip  abduction/extension with foot slider - x 20 cw Hops in tandem back and forth - x 15 Body squat sit to stand -- 2x10, 12# Single leg stance with intermittent UE support - x 20 B hip abduction machine: x20, 55 lbs;  Single leg heel taps off of 6" step - x 20    Therapeutic exercises to address LE muscle weakness. Compound LE exercises focusing on improving power production to improve walking speed to reduce risk of fall. Pt displayed fatigue after session. Addressed static balance with use of single leg stance.    PT Education - 06/03/18 1804    Education Details  form/technique with exercise    Person(s) Educated  Patient    Methods  Explanation;Demonstration    Comprehension  Verbalized understanding;Returned demonstration          PT Long Term Goals - 05/02/18 0914      PT LONG TERM GOAL #1   Title  Patient will be independent with balance HEP to continue benefits of therapy after discharge.    Baseline  dependent with form/technique for balance exercise; 03/25/2018: Independent     Time  6  Period  Weeks    Status  Achieved      PT LONG TERM GOAL #2   Title  Patient will improve FGA by 4 points to indicate significant improvement with balancing while ambulating and decrease of fall risk    Baseline  03/25/2018: 28/30    Time  6    Period  Weeks    Status  Achieved      PT LONG TERM GOAL #3   Title  Patient will be able to perform prolonged single leg stance >10sec with eyes closed to indicate improvement with static balance and decrease fall risk    Baseline  2 sec in standing EC; 03/25/2018: 3 sec; 04/29/2018: 4 sec    Time  6    Period  Weeks    Status  On-going      PT LONG TERM GOAL #4   Title   Pt will improve 6 MWT by 100 m (13ft) to demonstrate improved cardiorespiratory fitness    Baseline  Deferred to next session: 04/29/2018; 05/01/2018: 1417ft    Period  Weeks    Status  New            Plan - 06/03/18 1810    Clinical Impression Statement  Patient  demonstrates overall improvement with hopping compared to previous sessions indicating functional carryover between sessions and an overall improvement of power/strength. Patient demonstrates poor muscular endurance with exercises requiring frequent standing/sitting rest breaks throuhgout the entirity of the session. Patient will benefit from furhter skilled therapy to return to prior level of function.     Rehab Potential  Good    Clinical Impairments Affecting Rehab Potential  (-) hx of MS; (+) good motivation    PT Frequency  2x / week    PT Duration  6 weeks    PT Treatment/Interventions  Moist Heat;Iontophoresis 4mg /ml Dexamethasone;Electrical Stimulation;Aquatic Therapy;Cryotherapy;Therapeutic activities;Neuromuscular re-education;Therapeutic exercise;Patient/family education;Dry needling;Manual techniques;Passive range of motion;Balance training;Functional mobility training;Gait training;Stair training;Joint Manipulations    PT Next Visit Plan  Continue addressing balance    PT Home Exercise Plan  See education    Consulted and Agree with Plan of Care  Patient       Patient will benefit from skilled therapeutic intervention in order to improve the following deficits and impairments:  Pain, Decreased coordination, Impaired perceived functional ability, Increased fascial restricitons, Increased muscle spasms, Difficulty walking, Abnormal gait, Decreased balance, Decreased mobility, Impaired sensation  Visit Diagnosis: History of falling  Difficulty in walking, not elsewhere classified     Problem List There are no active problems to display for this patient.   [name], PT DPT 06/03/2018, 6:24 PM  [place] [facility] [address], [place], [address] Phone: [phone]   Fax:  [phone]  Name: Michelle Ruiz MRN: [id number] Date of Birth: 1971-02-26

## 2018-06-05 ENCOUNTER — Other Ambulatory Visit: Payer: Self-pay

## 2018-06-05 ENCOUNTER — Ambulatory Visit: Payer: BLUE CROSS/BLUE SHIELD

## 2018-06-05 DIAGNOSIS — Z9181 History of falling: Secondary | ICD-10-CM

## 2018-06-05 DIAGNOSIS — R262 Difficulty in walking, not elsewhere classified: Secondary | ICD-10-CM | POA: Diagnosis not present

## 2018-06-05 NOTE — Therapy (Signed)
[facility] [address], [place], [address] Phone: [phone]   Fax:  [phone]  Physical Therapy Treatment  Patient Details  Name: Michelle Ruiz MRN: [id number] Date of Birth: 1971-02-02 Referring Provider (PT): Darrin Luis MD   Encounter Date: 06/05/2018  PT End of Session - 06/05/18 1748    Visit Number  25    Number of Visits  26    Date for PT Re-Evaluation  06/10/18    Authorization Type  BCBS    PT Start Time  6962    PT Stop Time  1800    PT Time Calculation (min)  45 min    Activity Tolerance  Patient tolerated treatment well    Behavior During Therapy  [facility] for tasks assessed/performed       Past Medical History:  Diagnosis Date  . Abnormal Pap smear of cervix   . Anxiety   . Kidney stones   . MS (multiple sclerosis) ([place])   . UTI (urinary tract infection)     Past Surgical History:  Procedure Laterality Date  . BREAST BIOPSY Right 2010   stereotactic biopsy/ neg/ dr.byrnett  . BREAST BIOPSY Left 2012   neg/dr. brynett  . CESAREAN SECTION  2008   RPH  . TONSILLECTOMY  2006    There were no vitals filed for this visit.  Subjective Assessment - 06/05/18 1732    Subjective  Patient reports increased soreness after the previous session. Patient states she continues to be sore today although not as much as the previous day.      Patient is accompained by:  Family member    Pertinent History  Patient reports history of falls x3 in the past 6 months secondary to drop foot on the L LE. PMH: Multiple sclerosis; elbow fracture of the R UE    Limitations  Standing;Walking    How long can you stand comfortably?  20 min    How long can you walk comfortably?  1 miles     Patient Stated Goals  To be more balanced and core strengthening     Currently in Pain?  No/denies    Pain Onset  More than a month ago       TREATMENT  THERAPEUTIC EXERCISE Side stepping over hurdle in a circular  motion - x 8 in each direction ( 3 hurdles in each direction) Heel raises in standing -2 x 20 Side stepping over airex beam - x 10 wide steps  Ball tosses off of airex pad - x30 [place] walking - x 20  Tandem stance on airex beam - x 20  Single leg stance on flat ground - x10 x 5 sec     Therapeutic exercises to address LE muscle weakness. Compound LE exercises focusing on improving power production to improve walking speed to reduce risk of fall. Pt displayed fatigue after session. Addressed static balance with use of single leg stance.   PT Education - 06/05/18 1743    Education Details  form/technique with exercise    Person(s) Educated  Patient    Methods  Explanation;Demonstration    Comprehension  Verbalized understanding;Returned demonstration          PT Long Term Goals - 05/02/18 0914      PT LONG TERM GOAL #1   Title  Patient will be independent with balance HEP to continue benefits of therapy after discharge.    Baseline  dependent with form/technique for  balance exercise; 03/25/2018: Independent     Time  6    Period  Weeks    Status  Achieved      PT LONG TERM GOAL #2   Title  Patient will improve FGA by 4 points to indicate significant improvement with balancing while ambulating and decrease of fall risk    Baseline  03/25/2018: 28/30    Time  6    Period  Weeks    Status  Achieved      PT LONG TERM GOAL #3   Title  Patient will be able to perform prolonged single leg stance >10sec with eyes closed to indicate improvement with static balance and decrease fall risk    Baseline  2 sec in standing EC; 03/25/2018: 3 sec; 04/29/2018: 4 sec    Time  6    Period  Weeks    Status  On-going      PT LONG TERM GOAL #4   Title   Pt will improve 6 MWT by 100 m (13ft) to demonstrate improved cardiorespiratory fitness    Baseline  Deferred to next session: 04/29/2018; 05/01/2018: 1460ft    Period  Weeks    Status  New            Plan - 06/05/18 1748    Clinical  Impression Statement  Focussed on improving balance today as patient continues to have increased soreness along her LEs from the previous session. Patient demosntrates ability to perform balancing without UE support with eyes closed in tandem stance on airex. Patient continues to demonstrate improvement with SLS balance. Patient will benefit from further skilled therapy to return to prior level of function.     Rehab Potential  Good    Clinical Impairments Affecting Rehab Potential  (-) hx of MS; (+) good motivation    PT Frequency  2x / week    PT Duration  6 weeks    PT Treatment/Interventions  Moist Heat;Iontophoresis 4mg /ml Dexamethasone;Electrical Stimulation;Aquatic Therapy;Cryotherapy;Therapeutic activities;Neuromuscular re-education;Therapeutic exercise;Patient/family education;Dry needling;Manual techniques;Passive range of motion;Balance training;Functional mobility training;Gait training;Stair training;Joint Manipulations    PT Next Visit Plan  Continue addressing balance    PT Home Exercise Plan  See education    Consulted and Agree with Plan of Care  Patient       Patient will benefit from skilled therapeutic intervention in order to improve the following deficits and impairments:  Pain, Decreased coordination, Impaired perceived functional ability, Increased fascial restricitons, Increased muscle spasms, Difficulty walking, Abnormal gait, Decreased balance, Decreased mobility, Impaired sensation  Visit Diagnosis: History of falling  Difficulty in walking, not elsewhere classified     Problem List There are no active problems to display for this patient.   [name], PT DPT 06/05/2018, 5:57 PM  [place] [address] [place], [address] Phone: [phone]   Fax:  [phone]  Name: Michelle Ruiz MRN: [id number] Date of Birth: Apr 05, 1970

## 2018-06-06 ENCOUNTER — Other Ambulatory Visit: Payer: Self-pay

## 2018-06-10 ENCOUNTER — Other Ambulatory Visit: Payer: Self-pay

## 2018-06-10 ENCOUNTER — Ambulatory Visit: Payer: BLUE CROSS/BLUE SHIELD

## 2018-06-10 DIAGNOSIS — Z9181 History of falling: Secondary | ICD-10-CM

## 2018-06-10 DIAGNOSIS — R262 Difficulty in walking, not elsewhere classified: Secondary | ICD-10-CM | POA: Diagnosis not present

## 2018-06-11 NOTE — Therapy (Signed)
[facility] [address], [place], [address] Phone: [phone]   Fax:  [phone]  Physical Therapy Treatment  Patient Details  Name: Michelle Ruiz MRN: [id number] Date of Birth: [birth year] Referring Provider (PT): Darrin Luis MD   Encounter Date: 06/10/2018  PT End of Session - 06/10/18 1905    Visit Number  26    Number of Visits  26    Date for PT Re-Evaluation  06/10/18    Authorization Type  BCBS    PT Start Time  1840    PT Stop Time  1930    PT Time Calculation (min)  50 min    Activity Tolerance  Patient tolerated treatment well    Behavior During Therapy  [facility] for tasks assessed/performed       Past Medical History:  Diagnosis Date  . Abnormal Pap smear of cervix   . Anxiety   . Kidney stones   . MS (multiple sclerosis) ([place])   . UTI (urinary tract infection)     Past Surgical History:  Procedure Laterality Date  . BREAST BIOPSY Right 2010   stereotactic biopsy/ neg/ dr.byrnett  . BREAST BIOPSY Left 2012   neg/dr. brynett  . CESAREAN SECTION  2008   RPH  . TONSILLECTOMY  2006    There were no vitals filed for this visit.  Subjective Assessment - 06/10/18 1855    Subjective  Patient reports she felt beter after the previous session. Patient states she feels okay today.    Patient is accompained by:  Family member    Pertinent History  Patient reports history of falls x3 in the past 6 months secondary to drop foot on the L LE. PMH: Multiple sclerosis; elbow fracture of the R UE    Limitations  Standing;Walking    How long can you stand comfortably?  20 min    How long can you walk comfortably?  1 miles     Patient Stated Goals  To be more balanced and core strengthening     Currently in Pain?  No/denies    Pain Onset  More than a month ago       TREATMENT  THERAPEUTIC EXERCISE  Heel raises in standing -2 x 20 Tandem walking across balance stones with hurdle - x 12 Side  stepping across bosu ball - x 20 Forward step ups on bosu ball - x 20 Tandem stance on airex pad - x 20 with ball toss Hip abduction at hip machine -- x 20 55#    Therapeutic exercises to address LE muscle weakness. Compound LE exercises focusing on improving power production to improve walking speed to reduce risk of fall. Pt displayed fatigue after session. Addressed static balance with use of single leg stance.       PT Education - 06/10/18 1904    Education Details  form/technique with exercise    Person(s) Educated  Patient    Methods  Explanation;Demonstration    Comprehension  Verbalized understanding;Returned demonstration          PT Long Term Goals - 05/02/18 0914      PT LONG TERM GOAL #1   Title  Patient will be independent with balance HEP to continue benefits of therapy after discharge.    Baseline  dependent with form/technique for balance exercise; 03/25/2018: Independent     Time  6    Period  Weeks    Status  Achieved  PT LONG TERM GOAL #2   Title  Patient will improve FGA by 4 points to indicate significant improvement with balancing while ambulating and decrease of fall risk    Baseline  03/25/2018: 28/30    Time  6    Period  Weeks    Status  Achieved      PT LONG TERM GOAL #3   Title  Patient will be able to perform prolonged single leg stance >10sec with eyes closed to indicate improvement with static balance and decrease fall risk    Baseline  2 sec in standing EC; 03/25/2018: 3 sec; 04/29/2018: 4 sec    Time  6    Period  Weeks    Status  On-going      PT LONG TERM GOAL #4   Title   Pt will improve 6 MWT by 100 m (131ft) to demonstrate improved cardiorespiratory fitness    Baseline  Deferred to next session: 04/29/2018; 05/01/2018: 1410ft    Period  Weeks    Status  New            Plan - 06/10/18 1913    Clinical Impression Statement  Continued to perform exercises in standing to address strength and balance limitations. Although  patient demonstrates improvement with these motions, she demonstrates difficulty with performing dynamic balance in narrow BOS indicating increased fall risk with ambulating with narrow BOS. Patient will benefit from further skilled therapy to return to prior level of function; will perform recert NV.     Rehab Potential  Good    Clinical Impairments Affecting Rehab Potential  (-) hx of MS; (+) good motivation    PT Frequency  2x / week    PT Duration  6 weeks    PT Treatment/Interventions  Moist Heat;Iontophoresis 4mg /ml Dexamethasone;Electrical Stimulation;Aquatic Therapy;Cryotherapy;Therapeutic activities;Neuromuscular re-education;Therapeutic exercise;Patient/family education;Dry needling;Manual techniques;Passive range of motion;Balance training;Functional mobility training;Gait training;Stair training;Joint Manipulations    PT Next Visit Plan  Continue addressing balance    PT Home Exercise Plan  See education    Consulted and Agree with Plan of Care  Patient       Patient will benefit from skilled therapeutic intervention in order to improve the following deficits and impairments:  Pain, Decreased coordination, Impaired perceived functional ability, Increased fascial restricitons, Increased muscle spasms, Difficulty walking, Abnormal gait, Decreased balance, Decreased mobility, Impaired sensation  Visit Diagnosis: History of falling  Difficulty in walking, not elsewhere classified     Problem List There are no active problems to display for this patient.   [name], PT DPT 06/11/2018, 9:27 AM  [place] [facility] [address], [place], [address] Phone: [phone]   Fax:  [phone]  Name: Michelle Ruiz MRN: [id number] Date of Birth: 04-Jul-1970

## 2018-06-20 ENCOUNTER — Ambulatory Visit: Payer: BLUE CROSS/BLUE SHIELD

## 2018-07-08 NOTE — Therapy (Signed)
[place] [facility] SERVICES [address] [place], [place], [address] Phone: [phone]   Fax:  [phone]  Patient Details  Name: Michelle Ruiz MRN: [id number] Date of Birth: 19-Sep-1970 Referring Provider:  No ref. provider found  Encounter Date: 07/08/2018  The [facility] outpatient clinics are closed at this time due to the COVID-19 epidemic. The patient was contacted in regards to their therapy services. The patient is in agreement that they are safe and consent to being on hold for therapy services until the [facility] outpatient facilities reopen. At which time, the patient will be contacted to schedule an appointment to resume therapy services.    [name], PT DPT 07/08/2018, 11:13 AM  [place] [facility] SERVICES [address] [place], [place], [address] Phone: [phone]   Fax:  [phone]

## 2018-07-29 ENCOUNTER — Encounter: Payer: Self-pay | Admitting: Obstetrics and Gynecology

## 2018-07-29 ENCOUNTER — Other Ambulatory Visit: Payer: Self-pay

## 2018-07-29 ENCOUNTER — Ambulatory Visit: Payer: BLUE CROSS/BLUE SHIELD | Admitting: Obstetrics and Gynecology

## 2018-07-29 ENCOUNTER — Ambulatory Visit (INDEPENDENT_AMBULATORY_CARE_PROVIDER_SITE_OTHER): Payer: BLUE CROSS/BLUE SHIELD | Admitting: Obstetrics and Gynecology

## 2018-07-29 VITALS — BP 130/80 | Ht 69.0 in | Wt 187.0 lb

## 2018-07-29 DIAGNOSIS — R300 Dysuria: Secondary | ICD-10-CM

## 2018-07-29 DIAGNOSIS — N390 Urinary tract infection, site not specified: Secondary | ICD-10-CM | POA: Diagnosis not present

## 2018-07-29 LAB — POCT URINALYSIS DIPSTICK
Bilirubin, UA: NEGATIVE
Glucose, UA: NEGATIVE
Ketones, UA: NEGATIVE
Nitrite, UA: NEGATIVE
Protein, UA: NEGATIVE
Spec Grav, UA: <=1.005 — AB (ref 1.010–1.025)
Urobilinogen, UA: 0.2 E.U./dL
pH, UA: 6.5 (ref 5.0–8.0)

## 2018-07-29 MED ORDER — CIPROFLOXACIN HCL 500 MG PO TABS: 500.0000 mg | ORAL_TABLET | Freq: Every day | ORAL | 1 refills | Status: DC

## 2018-07-29 NOTE — Patient Instructions (Signed)
Michelle Ruiz [id number] [employer] Environmental consultant

## 2018-07-29 NOTE — Progress Notes (Signed)
Patient ID: Michelle Ruiz, female   DOB: 1970-03-30, 48 y.o.   MRN: [id number]  Reason for Consult: Urinary Tract Infection (Burning with urination, frequency, pain with urination )   Referred by Copland, Deirdre Evener, PA-C  Subjective:     HPI:  Michelle Ruiz is a 48 y.o. female  She presents today with complaints of a UTI. She has been having pain and frequency. This happens to her commonly after having intercourse. She has been seen by urology for this issue and was diagnosed with MD bladder. She has difficulty fully emptying her bladder. She tries to urinate and shower after intercourse. Because she commonly gets UTI after intercourse she often avoids the activity despite desire to have intercourse.    Past Medical History:  Diagnosis Date  . Abnormal Pap smear of cervix   . Anxiety   . Kidney stones   . MS (multiple sclerosis) ([place])   . UTI (urinary tract infection)    Family History  Problem Relation Age of Onset  . Endometrial cancer Mother 74  . Multiple sclerosis Sister 74  . Cancer Sister 58       appendix  . Autism Son        Infantile  . Diabetes type II Other        Aunt  . Bladder Cancer Neg Hx   . Kidney cancer Neg Hx    Past Surgical History:  Procedure Laterality Date  . BREAST BIOPSY Right 2010   stereotactic biopsy/ neg/ dr.byrnett  . BREAST BIOPSY Left 2012   neg/dr. brynett  . CESAREAN SECTION  2008   RPH  . TONSILLECTOMY  2006    Short Social History:  Social History   Tobacco Use  . Smoking status: Never Smoker  . Smokeless tobacco: Never Used  Substance Use Topics  . Alcohol use: Not Currently    Alcohol/week: 4.0 standard drinks    Types: 4 Glasses of wine per week    Allergies  Allergen Reactions  . Amoxicillin Other (See Comments)  . Other Other (See Comments)    Mushrooms - SOB  . Sulfa Antibiotics     Other reaction(s): Unknown    Current Outpatient Medications  Medication Sig Dispense Refill  . Ascorbic Acid (VITAMIN C)  1000 MG tablet Take 1,000 mg by mouth daily.    . baclofen (LIORESAL) 10 MG tablet 10 mg 2 (two) times daily.    Marland Kitchen buPROPion (WELLBUTRIN SR) 100 MG 12 hr tablet TAKE 1 TABLET BY MOUTH TWICE DAILY AT 8AM AND 3PM  1  . Cholecalciferol (PA VITAMIN D-3) 2000 UNITS CAPS Take 2,000 Units by mouth.    . clonazePAM (KLONOPIN) 0.5 MG tablet TAKE 1 TABLET BY MOUTH EVERY DAY AS NEEDED **STOP XANAX**  3  . cyanocobalamin 2000 MCG tablet Take 2,000 mcg by mouth daily.    Marland Kitchen dalfampridine (AMPYRA) 10 MG TB12 Take 10 mg by mouth 2 (two) times daily.    . Dimethyl Fumarate (TECFIDERA) 240 MG CPDR 240 mg 1 day or 1 dose.    . sertraline (ZOLOFT) 50 MG tablet Take 100 mg by mouth.     No current facility-administered medications for this visit.     Review of Systems  Constitutional: Negative for chills, fatigue, fever and unexpected weight change.  HENT: Negative for trouble swallowing.  Eyes: Negative for loss of vision.  Respiratory: Negative for cough, shortness of breath and wheezing.  Cardiovascular: Negative for chest pain, leg swelling, palpitations and  syncope.  GI: Negative for abdominal pain, blood in stool, diarrhea, nausea and vomiting.  GU: Negative for difficulty urinating, dysuria, frequency and hematuria.  Musculoskeletal: Negative for back pain, leg pain and joint pain.  Skin: Negative for rash.  Neurological: Negative for dizziness, headaches, light-headedness, numbness and seizures.  Psychiatric: Negative for behavioral problem, confusion, depressed mood and sleep disturbance.       Objective:  Objective   Vitals:   07/29/18 1044  BP: 130/80  Weight: 187 lb (84.8 kg)  Height: 5\' 9"  (1.753 m)   Body mass index is 27.62 kg/m.  Physical Exam Vitals signs and nursing note reviewed.  Constitutional:      Appearance: She is well-developed.  HENT:     Head: Normocephalic and atraumatic.  Eyes:     Pupils: Pupils are equal, round, and reactive to light.  Cardiovascular:      Rate and Rhythm: Normal rate and regular rhythm.  Pulmonary:     Effort: Pulmonary effort is normal. No respiratory distress.  Skin:    General: Skin is warm and dry.  Neurological:     Mental Status: She is alert and oriented to person, place, and time.  Psychiatric:        Behavior: Behavior normal.        Thought Content: Thought content normal.        Judgment: Judgment normal.        Assessment/Plan:     48 yo with recurrent UTI 1. Will treat with 3 day course of ciprofloxacin urine culture sent to confirm diagnosis. Has been sensitive to all medications when tested in the past.  2. She would like to start suppressive therapy by taking an antibiotic after intercourse. Her frequency of intercourse is such that this would be reasonable and not result with her taking a daily medication.  3. Discussed options such as a bidet to aide in hygiene.  More than 20 minutes were spent face to face with the patient in the room with more than 50% of the time spent providing counseling and discussing the plan of management. We discussed.    Adrian Prows MD [facility], [facility] 07/29/2018 11:52 AM

## 2018-07-30 ENCOUNTER — Telehealth: Payer: Self-pay

## 2018-07-30 NOTE — Telephone Encounter (Signed)
Pt called triage line stating she was seen yesterday for a UTI. She was given Cipro 500mg . The instructions state she needs to take 1 tablet once daily. Pt says when she takes this medication she usually takes it 2x daily. She thinks it was sent in wrong. Please call her back at [phone]

## 2018-07-30 NOTE — Telephone Encounter (Signed)
Returned call and explained that the dose and instructions were correct. She may of taken 250mg  of CIPRO twice a day in the past but since I gave her 500mg  she only needs it once a day.  Thanks, [employer]

## 2018-07-31 LAB — URINE CULTURE

## 2018-07-31 NOTE — Progress Notes (Signed)
Released to mychart with note, patient taking antibiotic.

## 2018-08-21 ENCOUNTER — Other Ambulatory Visit: Payer: Self-pay

## 2018-08-21 ENCOUNTER — Ambulatory Visit: Payer: BLUE CROSS/BLUE SHIELD | Attending: Neurology

## 2018-08-21 DIAGNOSIS — R262 Difficulty in walking, not elsewhere classified: Secondary | ICD-10-CM | POA: Diagnosis not present

## 2018-08-21 DIAGNOSIS — Z9181 History of falling: Secondary | ICD-10-CM | POA: Diagnosis not present

## 2018-08-21 NOTE — Therapy (Signed)
[facility] [address], [place], [address] Phone: [phone]   Fax:  [phone]  Physical Therapy Treatment The patient has been informed of current processes in place at Outpatient Rehab to protect patients from Covid-19 exposure including social distancing, schedule modifications, and new cleaning procedures. After discussing their particular risk with a therapist based on the patient's personal risk factors, the patient has decided to proceed with in-person therapy.   Patient Details  Name: Michelle Ruiz MRN: [id number] Date of Birth: 1970/04/10 Referring Provider (PT): Darrin Luis MD   Encounter Date: 08/21/2018  PT End of Session - 08/21/18 1618    Visit Number  27    Number of Visits  36    Date for PT Re-Evaluation  09/18/18    Authorization Type  BCBS    PT Start Time  1600    PT Stop Time  1645    PT Time Calculation (min)  45 min    Activity Tolerance  Patient tolerated treatment well    Behavior During Therapy  WFL for tasks assessed/performed       Past Medical History:  Diagnosis Date  . Abnormal Pap smear of cervix   . Anxiety   . Kidney stones   . MS (multiple sclerosis) ([place])   . UTI (urinary tract infection)     Past Surgical History:  Procedure Laterality Date  . BREAST BIOPSY Right 2010   stereotactic biopsy/ neg/ dr.byrnett  . BREAST BIOPSY Left 2012   neg/dr. brynett  . CESAREAN SECTION  2008   RPH  . TONSILLECTOMY  2006    There were no vitals filed for this visit.  Subjective Assessment - 08/21/18 1626    Subjective  Patient reports she had one fall since the previous session. Patient states she has been performing her exercise routine two times per week. Patient states she is worried about her mask keeping her too warm. Patient states increased pain along her knee since the fall but this has been improving since the incident.     Patient is accompained by:  Family  member    Pertinent History  Patient reports history of falls x3 in the past 6 months secondary to drop foot on the L LE. PMH: Multiple sclerosis; elbow fracture of the R UE    Limitations  Standing;Walking    How long can you stand comfortably?  20 min    How long can you walk comfortably?  1 miles     Patient Stated Goals  To be more balanced and core strengthening     Currently in Pain?  No/denies    Pain Onset  More than a month ago       TREATMENT  Therapeutic Exercise Single leg stance EC, EO - x 4 x 30sec Both for EC and EO 1424ft ambulation with focus on improving heel strike  Lunges - with UE support - x15  Lateral hip slider - x 20  Single leg - leg reaches - x 8 B  Ball toss while standing on airex pad - x 20 Addressed these exercises to help patient's endurance and LE strength. Patient demonstrates increased fatigue at the end of the session but no increase in pain.    PT Education - 08/21/18 1622    Education Details  form/technique with exercise ; HEP lunges    Person(s) Educated  Patient    Methods  Explanation;Demonstration    Comprehension  Verbalized  understanding;Returned demonstration          PT Long Term Goals - 08/21/18 1639      PT LONG TERM GOAL #1   Title  Patient will be independent with balance HEP to continue benefits of therapy after discharge.    Baseline  dependent with form/technique for balance exercise; 03/25/2018: Independent     Time  6    Period  Weeks    Status  Achieved      PT LONG TERM GOAL #2   Title  Patient will improve FGA by 4 points to indicate significant improvement with balancing while ambulating and decrease of fall risk    Baseline  03/25/2018: 28/30    Time  6    Period  Weeks    Status  Achieved      PT LONG TERM GOAL #3   Title  Patient will be able to perform prolonged single leg stance >10sec with eyes closed to indicate improvement with static balance and decrease fall risk    Baseline  2 sec in standing EC;  03/25/2018: 3 sec; 04/29/2018: 4 sec; 08/21/2018: 5 sec    Time  6    Period  Weeks    Status  On-going      PT LONG TERM GOAL #4   Title   Pt will improve 6 MWT by 100 m (110ft) to demonstrate improved cardiorespiratory fitness    Baseline  Deferred to next session: 04/29/2018; 05/01/2018: 1439ft; 08/21/2018: 1423ft    Period  Weeks    Status  On-going            Plan - 08/21/18 1632    Clinical Impression Statement  Patient is making progress towards long term goals despite being away from physical from physical therapy secondary to COVID crisis. Although patient is improving, she continues to have limitations with ambulation with scoring less on the 14minWT. Patient demonstrates poor dynamic balance with requiring UE support for exercises with movement most notably with single leg stance. Patient will benefit from further skilled therapy to return to prior level of function.      Rehab Potential  Good    Clinical Impairments Affecting Rehab Potential  (-) hx of MS; (+) good motivation    PT Frequency  2x / week    PT Duration  6 weeks    PT Treatment/Interventions  Moist Heat;Iontophoresis 4mg /ml Dexamethasone;Electrical Stimulation;Aquatic Therapy;Cryotherapy;Therapeutic activities;Neuromuscular re-education;Therapeutic exercise;Patient/family education;Dry needling;Manual techniques;Passive range of motion;Balance training;Functional mobility training;Gait training;Stair training;Joint Manipulations    PT Next Visit Plan  Continue addressing balance    PT Home Exercise Plan  See education    Consulted and Agree with Plan of Care  Patient       Patient will benefit from skilled therapeutic intervention in order to improve the following deficits and impairments:  Pain, Decreased coordination, Impaired perceived functional ability, Increased fascial restricitons, Increased muscle spasms, Difficulty walking, Abnormal gait, Decreased balance, Decreased mobility, Impaired sensation  Visit  Diagnosis: History of falling  Difficulty in walking, not elsewhere classified     Problem List There are no active problems to display for this patient.   [name], PT DPT 08/21/2018, 4:50 PM  [facility] [address] [place], [address] Phone: [phone]   Fax:  [phone]  Name: Michelle Ruiz MRN: [id number] Date of Birth: [birth year]

## 2018-08-26 ENCOUNTER — Other Ambulatory Visit: Payer: Self-pay

## 2018-08-26 ENCOUNTER — Ambulatory Visit: Payer: BC Managed Care – PPO | Attending: Neurology

## 2018-08-26 DIAGNOSIS — Z9181 History of falling: Secondary | ICD-10-CM | POA: Diagnosis not present

## 2018-08-26 DIAGNOSIS — R262 Difficulty in walking, not elsewhere classified: Secondary | ICD-10-CM | POA: Insufficient documentation

## 2018-08-26 NOTE — Therapy (Signed)
[facility] [address] [place], [address] Phone: [phone]   Fax:  [phone]  Physical Therapy Treatment  Patient Details  Name: Michelle Ruiz MRN: [id number] Date of Birth: 10/25/1970 Referring Provider (PT): Darrin Luis MD   Encounter Date: 08/26/2018  PT End of Session - 08/26/18 1715    Visit Number  28    Number of Visits  36    Date for PT Re-Evaluation  09/18/18    Authorization Type  BCBS    PT Start Time  1700    PT Stop Time  1745    PT Time Calculation (min)  45 min    Activity Tolerance  Patient tolerated treatment well    Behavior During Therapy  [facility] for tasks assessed/performed       Past Medical History:  Diagnosis Date  . Abnormal Pap smear of cervix   . Anxiety   . Kidney stones   . MS (multiple sclerosis) ([place])   . UTI (urinary tract infection)     Past Surgical History:  Procedure Laterality Date  . BREAST BIOPSY Right 2010   stereotactic biopsy/ neg/ dr.byrnett  . BREAST BIOPSY Left 2012   neg/dr. brynett  . CESAREAN SECTION  2008   RPH  . TONSILLECTOMY  2006    There were no vitals filed for this visit.  Subjective Assessment - 08/26/18 1711    Subjective  Patient reports increased sorenes and fatigue after the previous session but no issues otherwise.     Patient is accompained by:  Family member    Pertinent History  Patient reports history of falls x3 in the past 6 months secondary to drop foot on the L LE. PMH: Multiple sclerosis; elbow fracture of the R UE    Limitations  Standing;Walking    How long can you stand comfortably?  20 min    How long can you walk comfortably?  1 miles     Patient Stated Goals  To be more balanced and core strengthening     Currently in Pain?  No/denies    Pain Onset  More than a month ago         TREATMENT  Therapeutic Exercise Running man on airex pad - x15 Monster walks in standing - x5  Forward/backward leg swings -  x 1 min Side to side leg swings - x 74min  Single leg - leg reaches - x 8 B  Lunges - with UE support - x15  Semi-Tandem stance hops - x 10 Standing squat jumps - x 20   Addressed these exercises to help patient's endurance and LE strength. Patient demonstrates increased fatigue at the end of the session but no increase in pain.    PT Education - 08/26/18 1714    Education Details  form/technique with exercise; HEP running man     Person(s) Educated  Patient    Methods  Explanation;Demonstration    Comprehension  Verbalized understanding;Returned demonstration          PT Long Term Goals - 08/21/18 1639      PT LONG TERM GOAL #1   Title  Patient will be independent with balance HEP to continue benefits of therapy after discharge.    Baseline  dependent with form/technique for balance exercise; 03/25/2018: Independent     Time  6    Period  Weeks    Status  Achieved      PT LONG TERM  GOAL #2   Title  Patient will improve FGA by 4 points to indicate significant improvement with balancing while ambulating and decrease of fall risk    Baseline  03/25/2018: 28/30    Time  6    Period  Weeks    Status  Achieved      PT LONG TERM GOAL #3   Title  Patient will be able to perform prolonged single leg stance >10sec with eyes closed to indicate improvement with static balance and decrease fall risk    Baseline  2 sec in standing EC; 03/25/2018: 3 sec; 04/29/2018: 4 sec; 08/21/2018: 5 sec    Time  6    Period  Weeks    Status  On-going      PT LONG TERM GOAL #4   Title   Pt will improve 6 MWT by 100 m (150ft) to demonstrate improved cardiorespiratory fitness    Baseline  Deferred to next session: 04/29/2018; 05/01/2018: 1462ft; 08/21/2018: 1413ft    Period  Weeks    Status  On-going            Plan - 08/26/18 1741    Clinical Impression Statement  Patient demonstrates improvement with hopping compared to previous sessions indicating functional carryover between sessions. Although  patient is improving, she continues to have difficulty with single leg positions and will benefit from further skilled therapy to return to prior level function.     Rehab Potential  Good    Clinical Impairments Affecting Rehab Potential  (-) hx of MS; (+) good motivation    PT Frequency  2x / week    PT Duration  6 weeks    PT Treatment/Interventions  Moist Heat;Iontophoresis 4mg /ml Dexamethasone;Electrical Stimulation;Aquatic Therapy;Cryotherapy;Therapeutic activities;Neuromuscular re-education;Therapeutic exercise;Patient/family education;Dry needling;Manual techniques;Passive range of motion;Balance training;Functional mobility training;Gait training;Stair training;Joint Manipulations    PT Next Visit Plan  Continue addressing balance    PT Home Exercise Plan  See education    Consulted and Agree with Plan of Care  Patient       Patient will benefit from skilled therapeutic intervention in order to improve the following deficits and impairments:  Pain, Decreased coordination, Impaired perceived functional ability, Increased fascial restricitons, Increased muscle spasms, Difficulty walking, Abnormal gait, Decreased balance, Decreased mobility, Impaired sensation  Visit Diagnosis: Difficulty in walking, not elsewhere classified  History of falling     Problem List There are no active problems to display for this patient.   [name], PT DPT 08/26/2018, 5:52 PM  [facility] [address] [place], [address] Phone: [phone]   Fax:  [phone]  Name: FINESSE FIELDER MRN: [id number] Date of Birth: 10/02/1970

## 2018-08-28 ENCOUNTER — Ambulatory Visit: Payer: BC Managed Care – PPO

## 2018-08-28 ENCOUNTER — Other Ambulatory Visit: Payer: Self-pay

## 2018-08-28 DIAGNOSIS — R262 Difficulty in walking, not elsewhere classified: Secondary | ICD-10-CM

## 2018-08-28 DIAGNOSIS — Z9181 History of falling: Secondary | ICD-10-CM | POA: Diagnosis not present

## 2018-08-28 NOTE — Therapy (Signed)
[facility] [address] [place], [address] Phone: [phone]   Fax:  [phone]  Physical Therapy Treatment  Patient Details  Name: Michelle Ruiz MRN: [id number] Date of Birth: September 24, 1970 Referring Provider (PT): Darrin Luis MD   Encounter Date: 08/28/2018  PT End of Session - 08/28/18 1709    Visit Number  29    Number of Visits  36    Date for PT Re-Evaluation  09/18/18    Authorization Type  BCBS    PT Start Time  1700    PT Stop Time  1745    PT Time Calculation (min)  45 min    Activity Tolerance  Patient tolerated treatment well    Behavior During Therapy  [facility] for tasks assessed/performed       Past Medical History:  Diagnosis Date  . Abnormal Pap smear of cervix   . Anxiety   . Kidney stones   . MS (multiple sclerosis) ([place])   . UTI (urinary tract infection)     Past Surgical History:  Procedure Laterality Date  . BREAST BIOPSY Right 2010   stereotactic biopsy/ neg/ dr.byrnett  . BREAST BIOPSY Left 2012   neg/dr. brynett  . CESAREAN SECTION  2008   RPH  . TONSILLECTOMY  2006    There were no vitals filed for this visit.  Subjective Assessment - 08/28/18 1706    Subjective  Patient reports she was not as sore compared to the previous session. Patient states she is improving overall.     Patient is accompained by:  Family member    Pertinent History  Patient reports history of falls x3 in the past 6 months secondary to drop foot on the L LE. PMH: Multiple sclerosis; elbow fracture of the R UE    Limitations  Standing;Walking    How long can you stand comfortably?  20 min    How long can you walk comfortably?  1 miles     Patient Stated Goals  To be more balanced and core strengthening     Currently in Pain?  No/denies    Pain Onset  More than a month ago       TREATMENT  Therapeutic Exercise Side to side leg swings - x 66min B Forward leg swings - x 76min B Cone taps with  hands onto 3 cones on SLS with UE support - x 20  Single leg - leg reaches - x 5 with three cones  Single leg hops with UE support - x 15 Squats: sit to stands with - x 15 Monster walks in standing - x10 2ft Ball toss with feet in semitandem - x 15 on airex   Addressed these exercises to help patient's endurance and LE strength. Patient demonstrates increased fatigue at the end of the session but no increase in pain.    PT Education - 08/28/18 1708    Education Details  Form/technique with exercise; hip abduction    Person(s) Educated  Patient    Methods  Explanation;Demonstration    Comprehension  Verbalized understanding;Returned demonstration          PT Long Term Goals - 08/21/18 1639      PT LONG TERM GOAL #1   Title  Patient will be independent with balance HEP to continue benefits of therapy after discharge.    Baseline  dependent with form/technique for balance exercise; 03/25/2018: Independent     Time  6  Period  Weeks    Status  Achieved      PT LONG TERM GOAL #2   Title  Patient will improve FGA by 4 points to indicate significant improvement with balancing while ambulating and decrease of fall risk    Baseline  03/25/2018: 28/30    Time  6    Period  Weeks    Status  Achieved      PT LONG TERM GOAL #3   Title  Patient will be able to perform prolonged single leg stance >10sec with eyes closed to indicate improvement with static balance and decrease fall risk    Baseline  2 sec in standing EC; 03/25/2018: 3 sec; 04/29/2018: 4 sec; 08/21/2018: 5 sec    Time  6    Period  Weeks    Status  On-going      PT LONG TERM GOAL #4   Title   Pt will improve 6 MWT by 100 m (199ft) to demonstrate improved cardiorespiratory fitness    Baseline  Deferred to next session: 04/29/2018; 05/01/2018: 1424ft; 08/21/2018: 1423ft    Period  Weeks    Status  On-going            Plan - 08/28/18 1725    Clinical Impression Statement  Patient demonstrates improvement with hopping,  shes able to perform on one hop which she was not able to perform previously indicating improvement in power. Patient demonstrates earlier onset of fatigue compared to the visits before the COVID crisis indicating worse endurance. Patient will benefit from further skilled therapy focused on improving power to address balance limitations and getting up from the ground without assistance.      Rehab Potential  Good    Clinical Impairments Affecting Rehab Potential  (-) hx of MS; (+) good motivation    PT Frequency  2x / week    PT Duration  6 weeks    PT Treatment/Interventions  Moist Heat;Iontophoresis 4mg /ml Dexamethasone;Electrical Stimulation;Aquatic Therapy;Cryotherapy;Therapeutic activities;Neuromuscular re-education;Therapeutic exercise;Patient/family education;Dry needling;Manual techniques;Passive range of motion;Balance training;Functional mobility training;Gait training;Stair training;Joint Manipulations    PT Next Visit Plan  Continue addressing balance    PT Home Exercise Plan  See education    Consulted and Agree with Plan of Care  Patient       Patient will benefit from skilled therapeutic intervention in order to improve the following deficits and impairments:  Pain, Decreased coordination, Impaired perceived functional ability, Increased fascial restricitons, Increased muscle spasms, Difficulty walking, Abnormal gait, Decreased balance, Decreased mobility, Impaired sensation  Visit Diagnosis: Difficulty in walking, not elsewhere classified  History of falling     Problem List There are no active problems to display for this patient.   [name], PT DPT 08/28/2018, 5:45 PM  [facility] [address] [place], [address] Phone: [phone]   Fax:  [phone]  Name: CALISE DUNCKEL MRN: [id number] Date of Birth: 09-11-1970

## 2018-09-02 ENCOUNTER — Ambulatory Visit: Payer: BC Managed Care – PPO

## 2018-09-04 ENCOUNTER — Other Ambulatory Visit: Payer: Self-pay | Admitting: Obstetrics and Gynecology

## 2018-09-04 DIAGNOSIS — Z1231 Encounter for screening mammogram for malignant neoplasm of breast: Secondary | ICD-10-CM

## 2018-09-05 ENCOUNTER — Other Ambulatory Visit: Payer: Self-pay

## 2018-09-05 ENCOUNTER — Ambulatory Visit: Payer: BC Managed Care – PPO

## 2018-09-05 DIAGNOSIS — Z9181 History of falling: Secondary | ICD-10-CM

## 2018-09-05 DIAGNOSIS — R262 Difficulty in walking, not elsewhere classified: Secondary | ICD-10-CM | POA: Diagnosis not present
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
## 2018-09-09 ENCOUNTER — Other Ambulatory Visit: Payer: Self-pay

## 2018-09-09 ENCOUNTER — Ambulatory Visit: Payer: BC Managed Care – PPO

## 2018-09-09 DIAGNOSIS — R262 Difficulty in walking, not elsewhere classified: Secondary | ICD-10-CM

## 2018-09-09 DIAGNOSIS — Z9181 History of falling: Secondary | ICD-10-CM | POA: Diagnosis not present

## 2018-09-09 NOTE — Therapy (Signed)
[facility] [address] [place], [address] Phone: [phone]   Fax:  [phone]  Physical Therapy Treatment  Patient Details  Name: Michelle Ruiz MRN: [id number] Date of Birth: January 25, 1971 Referring Provider (PT): Darrin Luis MD   Encounter Date: 09/09/2018  PT End of Session - 09/09/18 1731    Visit Number  31    Number of Visits  36    Date for PT Re-Evaluation  09/18/18    Authorization Type  BCBS    PT Start Time  1700    PT Stop Time  1745    PT Time Calculation (min)  45 min    Activity Tolerance  Patient tolerated treatment well    Behavior During Therapy  [facility] for tasks assessed/performed       Past Medical History:  Diagnosis Date  . Abnormal Pap smear of cervix   . Anxiety   . Kidney stones   . MS (multiple sclerosis) ([place])   . UTI (urinary tract infection)     Past Surgical History:  Procedure Laterality Date  . BREAST BIOPSY Right 2010   stereotactic biopsy/ neg/ dr.byrnett  . BREAST BIOPSY Left 2012   neg/dr. brynett  . CESAREAN SECTION  2008   RPH  . TONSILLECTOMY  2006    There were no vitals filed for this visit.  Subjective Assessment - 09/09/18 1721    Subjective  Patient states she was sore from the previous session reports no changes otherwise.    Patient is accompained by:  Family member    Pertinent History  Patient reports history of falls x3 in the past 6 months secondary to drop foot on the L LE. PMH: Multiple sclerosis; elbow fracture of the R UE    Limitations  Standing;Walking    How long can you stand comfortably?  20 min    How long can you walk comfortably?  1 miles     Patient Stated Goals  To be more balanced and core strengthening     Currently in Pain?  No/denies    Pain Onset  More than a month ago       TREATMENT  Therapeutic Exercise Side stepping across airex beam - x 20 Lunges reverse - x 20 Side to side leg swings - x 54min B Forward leg  swings - x 58min B Lateral lunges in standing - x 20  Tandem ambulation along airex beam - x10 down and back Hip circles with YTB around knees - x 20 circles cw/ccw Running man in standing off of airex pad - x 10 Ball toss with feet in semitandem - x 25 on airex beam   Addressed these exercises to help patient's endurance and LE strength. Patient demonstrates increased fatigue at the end of the session but no increase in pain.     PT Education - 09/09/18 1730    Education Details  form/technique with exercise; hip circles in standing    Person(s) Educated  Patient    Methods  Explanation;Demonstration    Comprehension  Verbalized understanding;Returned demonstration          PT Long Term Goals - 08/21/18 1639      PT LONG TERM GOAL #1   Title  Patient will be independent with balance HEP to continue benefits of therapy after discharge.    Baseline  dependent with form/technique for balance exercise; 03/25/2018: Independent     Time  6  Period  Weeks    Status  Achieved      PT LONG TERM GOAL #2   Title  Patient will improve FGA by 4 points to indicate significant improvement with balancing while ambulating and decrease of fall risk    Baseline  03/25/2018: 28/30    Time  6    Period  Weeks    Status  Achieved      PT LONG TERM GOAL #3   Title  Patient will be able to perform prolonged single leg stance >10sec with eyes closed to indicate improvement with static balance and decrease fall risk    Baseline  2 sec in standing EC; 03/25/2018: 3 sec; 04/29/2018: 4 sec; 08/21/2018: 5 sec    Time  6    Period  Weeks    Status  On-going      PT LONG TERM GOAL #4   Title   Pt will improve 6 MWT by 100 m (166ft) to demonstrate improved cardiorespiratory fitness    Baseline  Deferred to next session: 04/29/2018; 05/01/2018: 1425ft; 08/21/2018: 1471ft    Period  Weeks    Status  On-going            Plan - 09/09/18 1731    Clinical Impression Statement  Continued to focus on  improving patient's LE strength and balance with activities to address patient's ability to dynamic step out to catch her balance when ambulating. Patient demonstrates increased onset of fatigue with exercises, however, is able to do more than previous sessions demonstrating improvement. Patient will benefit from further skilled therapy to return to prior level of function.    Rehab Potential  Good    Clinical Impairments Affecting Rehab Potential  (-) hx of MS; (+) good motivation    PT Frequency  2x / week    PT Duration  6 weeks    PT Treatment/Interventions  Moist Heat;Iontophoresis 4mg /ml Dexamethasone;Electrical Stimulation;Aquatic Therapy;Cryotherapy;Therapeutic activities;Neuromuscular re-education;Therapeutic exercise;Patient/family education;Dry needling;Manual techniques;Passive range of motion;Balance training;Functional mobility training;Gait training;Stair training;Joint Manipulations    PT Next Visit Plan  Continue addressing balance    PT Home Exercise Plan  See education    Consulted and Agree with Plan of Care  Patient       Patient will benefit from skilled therapeutic intervention in order to improve the following deficits and impairments:  Pain, Decreased coordination, Impaired perceived functional ability, Increased fascial restricitons, Increased muscle spasms, Difficulty walking, Abnormal gait, Decreased balance, Decreased mobility, Impaired sensation  Visit Diagnosis: 1. Difficulty in walking, not elsewhere classified   2. History of falling        Problem List There are no active problems to display for this patient.   [name], PT DPT 09/09/2018, 5:46 PM  [facility] [address] [place], [address] Phone: [phone]   Fax:  [phone]  Name: Michelle Ruiz MRN: [id number] Date of Birth: Jan 20, 1971

## 2018-09-12 ENCOUNTER — Other Ambulatory Visit: Payer: Self-pay

## 2018-09-12 ENCOUNTER — Ambulatory Visit: Payer: BC Managed Care – PPO

## 2018-09-12 DIAGNOSIS — Z9181 History of falling: Secondary | ICD-10-CM

## 2018-09-12 DIAGNOSIS — R262 Difficulty in walking, not elsewhere classified: Secondary | ICD-10-CM

## 2018-09-12 NOTE — Therapy (Signed)
[facility] [address], [place], [address] Phone: [phone]   Fax:  [phone]  Physical Therapy Treatment  Patient Details  Name: Michelle Ruiz MRN: [id number] Date of Birth: May 18, 1970 Referring Provider (PT): Darrin Luis MD   Encounter Date: 09/12/2018  PT End of Session - 09/12/18 1725    Visit Number  32    Number of Visits  36    Date for PT Re-Evaluation  09/18/18    Authorization Type  BCBS    PT Start Time  1700    PT Stop Time  1745    PT Time Calculation (min)  45 min    Activity Tolerance  Patient tolerated treatment well    Behavior During Therapy  [facility] for tasks assessed/performed       Past Medical History:  Diagnosis Date  . Abnormal Pap smear of cervix   . Anxiety   . Kidney stones   . MS (multiple sclerosis) ([place])   . UTI (urinary tract infection)     Past Surgical History:  Procedure Laterality Date  . BREAST BIOPSY Right 2010   stereotactic biopsy/ neg/ dr.byrnett  . BREAST BIOPSY Left 2012   neg/dr. brynett  . CESAREAN SECTION  2008   RPH  . TONSILLECTOMY  2006    There were no vitals filed for this visit.  Subjective Assessment - 09/12/18 1705    Subjective  Patient reports no major changes since the previous session.    Patient is accompained by:  Family member    Pertinent History  Patient reports history of falls x3 in the past 6 months secondary to drop foot on the L LE. PMH: Multiple sclerosis; elbow fracture of the R UE    Limitations  Standing;Walking    How long can you stand comfortably?  20 min    How long can you walk comfortably?  1 miles     Patient Stated Goals  To be more balanced and core strengthening     Currently in Pain?  No/denies    Pain Onset  More than a month ago       TREATMENT  Therapeutic Exercise Side to side leg swings - x 92min B Forward leg swings - x 41min B Lateral lunges in standing - x 20  Squats in standing with UE  support - x 20 Single leg stance on airex pad - x 10 10sec Hip abduction with GTB around the knees - x 20  Heel raises on airex pad - x 20 with minimal UE support Side stepping across GTB - x 20 Ball toss with feet in semitandem - x 25 on airex beam   Addressed these exercises to help patient's endurance and LE strength. Patient demonstrates increased fatigue at the end of the session but no increase in pain.       PT Education - 09/12/18 1724    Education Details  form/technique with exercise    Person(s) Educated  Patient    Methods  Explanation;Demonstration    Comprehension  Verbalized understanding;Returned demonstration          PT Long Term Goals - 08/21/18 1639      PT LONG TERM GOAL #1   Title  Patient will be independent with balance HEP to continue benefits of therapy after discharge.    Baseline  dependent with form/technique for balance exercise; 03/25/2018: Independent     Time  6    Period  Weeks    Status  Achieved      PT LONG TERM GOAL #2   Title  Patient will improve FGA by 4 points to indicate significant improvement with balancing while ambulating and decrease of fall risk    Baseline  03/25/2018: 28/30    Time  6    Period  Weeks    Status  Achieved      PT LONG TERM GOAL #3   Title  Patient will be able to perform prolonged single leg stance >10sec with eyes closed to indicate improvement with static balance and decrease fall risk    Baseline  2 sec in standing EC; 03/25/2018: 3 sec; 04/29/2018: 4 sec; 08/21/2018: 5 sec    Time  6    Period  Weeks    Status  On-going      PT LONG TERM GOAL #4   Title   Pt will improve 6 MWT by 100 m (174ft) to demonstrate improved cardiorespiratory fitness    Baseline  Deferred to next session: 04/29/2018; 05/01/2018: 1450ft; 08/21/2018: 1439ft    Period  Weeks    Status  On-going            Plan - 09/12/18 1729    Clinical Impression Statement  Patient demonstrates with exercise with ability to perform  greater amount of exercise with the GTB compared to previous session. Continued to focus on improving dynamic balance with hopping and performing exercise with less restrictive UE support. Patient continues to demonstrate deceased dorsiflexion with commands and will benefit from further skilled therapy to return to prior level of function.    Rehab Potential  Good    Clinical Impairments Affecting Rehab Potential  (-) hx of MS; (+) good motivation    PT Frequency  2x / week    PT Duration  6 weeks    PT Treatment/Interventions  Moist Heat;Iontophoresis 4mg /ml Dexamethasone;Electrical Stimulation;Aquatic Therapy;Cryotherapy;Therapeutic activities;Neuromuscular re-education;Therapeutic exercise;Patient/family education;Dry needling;Manual techniques;Passive range of motion;Balance training;Functional mobility training;Gait training;Stair training;Joint Manipulations    PT Next Visit Plan  Continue addressing balance    PT Home Exercise Plan  See education    Consulted and Agree with Plan of Care  Patient       Patient will benefit from skilled therapeutic intervention in order to improve the following deficits and impairments:  Pain, Decreased coordination, Impaired perceived functional ability, Increased fascial restricitons, Increased muscle spasms, Difficulty walking, Abnormal gait, Decreased balance, Decreased mobility, Impaired sensation  Visit Diagnosis: 1. Difficulty in walking, not elsewhere classified   2. History of falling        Problem List There are no active problems to display for this patient.   [name], PT DPT 09/12/2018, 5:48 PM  [facility] [address], [place], [address] Phone: [phone]   Fax:  [phone]  Name: Michelle Ruiz MRN: [id number] Date of Birth: 1970-07-26

## 2018-09-13 ENCOUNTER — Ambulatory Visit
Admission: RE | Admit: 2018-09-13 | Discharge: 2018-09-13 | Disposition: A | Discharge status: Home / Self Care | Payer: BC Managed Care – PPO | Source: Ambulatory Visit | Attending: Obstetrics and Gynecology | Admitting: Obstetrics and Gynecology

## 2018-09-13 DIAGNOSIS — Z1231 Encounter for screening mammogram for malignant neoplasm of breast: Secondary | ICD-10-CM | POA: Insufficient documentation

## 2018-09-16 ENCOUNTER — Other Ambulatory Visit: Payer: Self-pay | Admitting: Obstetrics and Gynecology

## 2018-09-16 ENCOUNTER — Ambulatory Visit: Payer: BC Managed Care – PPO

## 2018-09-16 ENCOUNTER — Other Ambulatory Visit: Payer: Self-pay

## 2018-09-16 DIAGNOSIS — R262 Difficulty in walking, not elsewhere classified: Secondary | ICD-10-CM

## 2018-09-16 DIAGNOSIS — Z9181 History of falling: Secondary | ICD-10-CM

## 2018-09-16 DIAGNOSIS — N6310 Unspecified lump in the right breast, unspecified quadrant: Secondary | ICD-10-CM

## 2018-09-16 DIAGNOSIS — R928 Other abnormal and inconclusive findings on diagnostic imaging of breast: Secondary | ICD-10-CM

## 2018-09-17 DIAGNOSIS — F330 Major depressive disorder, recurrent, mild: Secondary | ICD-10-CM | POA: Diagnosis not present

## 2018-09-17 DIAGNOSIS — F3281 Premenstrual dysphoric disorder: Secondary | ICD-10-CM | POA: Diagnosis not present

## 2018-09-17 DIAGNOSIS — F411 Generalized anxiety disorder: Secondary | ICD-10-CM | POA: Diagnosis not present

## 2018-09-17 NOTE — Therapy (Signed)
[facility] [address], [place], [address] Phone: [phone]   Fax:  [phone]  Physical Therapy Treatment  Patient Details  Name: Michelle Ruiz MRN: [id number] Date of Birth: [birth year] Referring Provider (PT): Darrin Luis MD   Encounter Date: 09/16/2018  PT End of Session - 09/16/18 1731    Visit Number  33    Number of Visits  36    Date for PT Re-Evaluation  09/18/18    Authorization Type  BCBS    PT Start Time  1700    PT Stop Time  1745    PT Time Calculation (min)  45 min    Activity Tolerance  Patient tolerated treatment well    Behavior During Therapy  [facility] for tasks assessed/performed       Past Medical History:  Diagnosis Date  . Abnormal Pap smear of cervix   . Anxiety   . Kidney stones   . MS (multiple sclerosis) ([place])   . UTI (urinary tract infection)     Past Surgical History:  Procedure Laterality Date  . BREAST BIOPSY Right 2010   stereotactic biopsy/ neg/ dr.byrnett  . BREAST BIOPSY Left 2012   neg/dr. brynett  . CESAREAN SECTION  2008   RPH  . TONSILLECTOMY  2006    There were no vitals filed for this visit.  Subjective Assessment - 09/16/18 1723    Subjective  Patient states she had difficulty with performing walking after the last session but states it was temporary.    Patient is accompained by:  Family member    Pertinent History  Patient reports history of falls x3 in the past 6 months secondary to drop foot on the L LE. PMH: Multiple sclerosis; elbow fracture of the R UE    Limitations  Standing;Walking    How long can you stand comfortably?  20 min    How long can you walk comfortably?  1 miles     Patient Stated Goals  To be more balanced and core strengthening     Currently in Pain?  No/denies    Pain Onset  More than a month ago       TREATMENT  Therapeutic Exercise Side to side leg swings - x 62min B Forward leg swings - x 51min B Squats in standing  with UE support - x 20 with chair Tandem ambulation on airex beam - x 10 down and back Side stepping with heels off the airex beam - x 10 Lunges in standing forward and backward - x10 Making circles on airex beam for dynamic balance and foot planning - x 5 B Ball toss with feet in semitandem - x 25 on airex beam   Addressed these exercises to help patient's endurance and LE strength. Patient demonstrates increased fatigue at the end of the session but no increase in pain.     PT Education - 09/16/18 1731    Education Details  form/technique with exercise    Person(s) Educated  Patient    Methods  Explanation;Demonstration    Comprehension  Verbalized understanding;Returned demonstration          PT Long Term Goals - 08/21/18 1639      PT LONG TERM GOAL #1   Title  Patient will be independent with balance HEP to continue benefits of therapy after discharge.    Baseline  dependent with form/technique for balance exercise; 03/25/2018: Independent     Time  6    Period  Weeks    Status  Achieved      PT LONG TERM GOAL #2   Title  Patient will improve FGA by 4 points to indicate significant improvement with balancing while ambulating and decrease of fall risk    Baseline  03/25/2018: 28/30    Time  6    Period  Weeks    Status  Achieved      PT LONG TERM GOAL #3   Title  Patient will be able to perform prolonged single leg stance >10sec with eyes closed to indicate improvement with static balance and decrease fall risk    Baseline  2 sec in standing EC; 03/25/2018: 3 sec; 04/29/2018: 4 sec; 08/21/2018: 5 sec    Time  6    Period  Weeks    Status  On-going      PT LONG TERM GOAL #4   Title   Pt will improve 6 MWT by 100 m (162ft) to demonstrate improved cardiorespiratory fitness    Baseline  Deferred to next session: 04/29/2018; 05/01/2018: 1458ft; 08/21/2018: 148ft    Period  Weeks    Status  On-going            Plan - 09/17/18 1000    Clinical Impression Statement   Focused on improving LE strength and balance today. Patient makes continous improvements with ability to perform more dynamic strengthening exercises; i.e forward/backward lunges with step downs where needed indicating improvement in power and strength. Although patient is improving, she continues to demonstrate poor foot clearance when LEs are fatigued. Patient will benefit from furhter skilled therapy focused on improving endurance most notably of the dorsiflexors to decrease fall risk.    Rehab Potential  Good    Clinical Impairments Affecting Rehab Potential  (-) hx of MS; (+) good motivation    PT Frequency  2x / week    PT Duration  6 weeks    PT Treatment/Interventions  Moist Heat;Iontophoresis 4mg /ml Dexamethasone;Electrical Stimulation;Aquatic Therapy;Cryotherapy;Therapeutic activities;Neuromuscular re-education;Therapeutic exercise;Patient/family education;Dry needling;Manual techniques;Passive range of motion;Balance training;Functional mobility training;Gait training;Stair training;Joint Manipulations    PT Next Visit Plan  Continue addressing balance    PT Home Exercise Plan  See education    Consulted and Agree with Plan of Care  Patient       Patient will benefit from skilled therapeutic intervention in order to improve the following deficits and impairments:  Pain, Decreased coordination, Impaired perceived functional ability, Increased fascial restricitons, Increased muscle spasms, Difficulty walking, Abnormal gait, Decreased balance, Decreased mobility, Impaired sensation  Visit Diagnosis: 1. Difficulty in walking, not elsewhere classified   2. History of falling        Problem List There are no active problems to display for this patient.   [name], PT DPT 09/17/2018, 10:09 AM  [facility] [address] [place], [address] Phone: [phone]   Fax:  [phone]  Name: Michelle Ruiz MRN: [id number] Date of Birth: 10/09/1970

## 2018-09-19 ENCOUNTER — Ambulatory Visit: Payer: BC Managed Care – PPO

## 2018-09-19 ENCOUNTER — Other Ambulatory Visit: Payer: Self-pay

## 2018-09-19 DIAGNOSIS — R262 Difficulty in walking, not elsewhere classified: Secondary | ICD-10-CM | POA: Diagnosis not present

## 2018-09-19 DIAGNOSIS — Z9181 History of falling: Secondary | ICD-10-CM | POA: Diagnosis not present

## 2018-09-19 NOTE — Addendum Note (Signed)
Addended by: Blain Pais on: 09/19/2018 05:57 PM   Modules accepted: Orders

## 2018-09-19 NOTE — Therapy (Signed)
[name] [address], [place], [address] Phone: [phone]   Fax:  [phone]  Physical Therapy Treatment  Patient Details  Name: Michelle Ruiz MRN: [id number] Date of Birth: 07/04/1970 Referring Provider (PT): Darrin Luis MD   Encounter Date: 09/19/2018  PT End of Session - 09/19/18 1749    Visit Number  34    Number of Visits  44    Date for PT Re-Evaluation  10/17/18    Authorization Type  BCBS    PT Start Time  1700    PT Stop Time  1745    PT Time Calculation (min)  45 min    Activity Tolerance  Patient tolerated treatment well    Behavior During Therapy  [facility] for tasks assessed/performed       Past Medical History:  Diagnosis Date  . Abnormal Pap smear of cervix   . Anxiety   . Kidney stones   . MS (multiple sclerosis) ([place])   . UTI (urinary tract infection)     Past Surgical History:  Procedure Laterality Date  . BREAST BIOPSY Right 2010   stereotactic biopsy/ neg/ dr.byrnett  . BREAST BIOPSY Left 2012   neg/dr. brynett  . CESAREAN SECTION  2008   RPH  . TONSILLECTOMY  2006    There were no vitals filed for this visit.  Subjective Assessment - 09/19/18 1726    Subjective  Patient states her legs continue to be sore after the previous session. Patient states increased soreness along her LEs.    Patient is accompained by:  Family member    Pertinent History  Patient reports history of falls x3 in the past 6 months secondary to drop foot on the L LE. PMH: Multiple sclerosis; elbow fracture of the R UE    Limitations  Standing;Walking    How long can you stand comfortably?  20 min    How long can you walk comfortably?  1 miles     Patient Stated Goals  To be more balanced and core strengthening     Pain Onset  More than a month ago       TREATMENT  Therapeutic Exercise Side to side leg swings - x 39min B Forward leg swings - x 2min B Walking lunges - 59ft x 4 Ball toss with  feet in semitandem - x 25 on airex beam Walking with cues to improve heel strike and cadence - 15 min with frequent cueing on improving hip extension/stride length and decreasing flat foot strike Walking on treadmill for total of 3.5 min @ 2.3 and 2.5 mph with UE support to build up speed for jogging Single leg stance EC - 6sec x 20 on R x10 on L   Addressed these exercises to help patient's endurance and LE strength. Patient demonstrates increased fatigue at the end of the session but no increase in pain.      PT Education - 09/19/18 1738    Education Details  form/technique with exercise    Person(s) Educated  Patient    Methods  Explanation;Demonstration    Comprehension  Verbalized understanding;Returned demonstration          PT Long Term Goals - 09/19/18 1712      PT LONG TERM GOAL #1   Title  Patient will be independent with balance HEP to continue benefits of therapy after discharge.    Baseline  dependent with form/technique for balance exercise; 03/25/2018: Independent  Time  6    Period  Weeks    Status  Achieved      PT LONG TERM GOAL #2   Title  Patient will improve FGA by 4 points to indicate significant improvement with balancing while ambulating and decrease of fall risk    Baseline  03/25/2018: 28/30    Time  6    Period  Weeks    Status  Achieved      PT LONG TERM GOAL #3   Title  Patient will be able to perform prolonged single leg stance >10sec with eyes closed to indicate improvement with static balance and decrease fall risk    Baseline  2 sec in standing EC; 03/25/2018: 3 sec; 04/29/2018: 4 sec; 08/21/2018: 5 sec; 09/19/2018: 6 sec    Time  6    Period  Weeks    Status  On-going      PT LONG TERM GOAL #4   Title   Pt will improve 6 MWT by 100 m (143ft) to demonstrate improved cardiorespiratory fitness    Baseline  Deferred to next session: 04/29/2018; 05/01/2018: 1410ft; 08/21/2018: 1421ft; 09/19/2018: 1488ft    Period  Weeks    Status  On-going             Plan - 09/19/18 1749    Clinical Impression Statement  Patient is making progress towards long term goals with ability to perform greater amount of single leg stance with EC and improvement in 82minWT walk test compared to the previous session. Although patient is improving, she continues to demonstrate poor tib ant eccentric control with walking quickly indicating increased fall risk most notably with walking at quick speeds. Patient will benefit from furhter skilled therapy focused on improving limitations to return to prior level of function.    Rehab Potential  Good    Clinical Impairments Affecting Rehab Potential  (-) hx of MS; (+) good motivation    PT Frequency  2x / week    PT Duration  6 weeks    PT Treatment/Interventions  Moist Heat;Iontophoresis 4mg /ml Dexamethasone;Electrical Stimulation;Aquatic Therapy;Cryotherapy;Therapeutic activities;Neuromuscular re-education;Therapeutic exercise;Patient/family education;Dry needling;Manual techniques;Passive range of motion;Balance training;Functional mobility training;Gait training;Stair training;Joint Manipulations    PT Next Visit Plan  Continue addressing balance    PT Home Exercise Plan  See education    Consulted and Agree with Plan of Care  Patient       Patient will benefit from skilled therapeutic intervention in order to improve the following deficits and impairments:  Pain, Decreased coordination, Impaired perceived functional ability, Increased fascial restricitons, Increased muscle spasms, Difficulty walking, Abnormal gait, Decreased balance, Decreased mobility, Impaired sensation  Visit Diagnosis: 1. Difficulty in walking, not elsewhere classified   2. History of falling        Problem List There are no active problems to display for this patient.   [name], PT DPT 09/19/2018, 5:52 PM  [facility] [address] [place],  [address] Phone: [phone]   Fax:  [phone]  Name: Michelle Ruiz MRN: [id number] Date of Birth: November 09, 1970

## 2018-09-23 ENCOUNTER — Ambulatory Visit: Payer: BC Managed Care – PPO

## 2018-09-23 ENCOUNTER — Other Ambulatory Visit: Payer: Self-pay

## 2018-09-23 DIAGNOSIS — R262 Difficulty in walking, not elsewhere classified: Secondary | ICD-10-CM

## 2018-09-23 DIAGNOSIS — Z9181 History of falling: Secondary | ICD-10-CM

## 2018-09-23 NOTE — Therapy (Signed)
[facility] [address] [place], [address] Phone: [phone]   Fax:  [phone]  Physical Therapy Treatment  Patient Details  Name: Michelle Ruiz MRN: [id number] Date of Birth: Nov 29, 1970 Referring Provider (PT): Darrin Luis MD   Encounter Date: 09/23/2018  PT End of Session - 09/23/18 1702    Visit Number  35    Number of Visits  44    Date for PT Re-Evaluation  10/17/18    Authorization Type  BCBS    PT Start Time  1700    PT Stop Time  1745    PT Time Calculation (min)  45 min    Activity Tolerance  Patient tolerated treatment well    Behavior During Therapy  [facility] for tasks assessed/performed       Past Medical History:  Diagnosis Date  . Abnormal Pap smear of cervix   . Anxiety   . Kidney stones   . MS (multiple sclerosis) ([place])   . UTI (urinary tract infection)     Past Surgical History:  Procedure Laterality Date  . BREAST BIOPSY Right 2010   stereotactic biopsy/ neg/ dr.byrnett  . BREAST BIOPSY Left 2012   neg/dr. brynett  . CESAREAN SECTION  2008   RPH  . TONSILLECTOMY  2006    There were no vitals filed for this visit.  Subjective Assessment - 09/23/18 1659    Subjective  Patient reports no major changes since the previous session. Patient states improvement overall.    Patient is accompained by:  Family member    Pertinent History  Patient reports history of falls x3 in the past 6 months secondary to drop foot on the L LE. PMH: Multiple sclerosis; elbow fracture of the R UE    Limitations  Standing;Walking    How long can you stand comfortably?  20 min    How long can you walk comfortably?  1 miles     Patient Stated Goals  To be more balanced and core strengthening     Currently in Pain?  No/denies    Pain Onset  More than a month ago       TREATMENT  Therapeutic Exercise Side to side leg swings - x 63min B Forward leg swings - x 69min B Twist and pivot in standing - 1  min B  Walking lunges - 64ft x 4 Ball toss with feet in semitandem - x 25 on airex beam Walking on treadmill for total of 6 min @ 2.5, 2.8, 3.0 and 3.2 mph with UE support to build up speed for jogging with focus on improving  Dorsiflexion in standing with 5# weight - x 15 Walking with speed with focus on improving dorsiflexion - 54ft x 6 with 5# around each ankle Single leg stance EC - 6sec x 20 on R x10 on L   Addressed these exercises to help patient's endurance and LE strength. Patient demonstrates increased fatigue at the end of the session but no increase in pain.    PT Education - 09/23/18 1702    Education Details  form/technique with exercise    Person(s) Educated  Patient    Methods  Explanation;Demonstration    Comprehension  Verbalized understanding;Returned demonstration          PT Long Term Goals - 09/19/18 1712      PT LONG TERM GOAL #1   Title  Patient will be independent with balance HEP to continue benefits  of therapy after discharge.    Baseline  dependent with form/technique for balance exercise; 03/25/2018: Independent     Time  6    Period  Weeks    Status  Achieved      PT LONG TERM GOAL #2   Title  Patient will improve FGA by 4 points to indicate significant improvement with balancing while ambulating and decrease of fall risk    Baseline  03/25/2018: 28/30    Time  6    Period  Weeks    Status  Achieved      PT LONG TERM GOAL #3   Title  Patient will be able to perform prolonged single leg stance >10sec with eyes closed to indicate improvement with static balance and decrease fall risk    Baseline  2 sec in standing EC; 03/25/2018: 3 sec; 04/29/2018: 4 sec; 08/21/2018: 5 sec; 09/19/2018: 6 sec    Time  6    Period  Weeks    Status  On-going      PT LONG TERM GOAL #4   Title   Pt will improve 6 MWT by 100 m (134ft) to demonstrate improved cardiorespiratory fitness    Baseline  Deferred to next session: 04/29/2018; 05/01/2018: 1447ft; 08/21/2018: 1445ft;  09/19/2018: 1463ft    Period  Weeks    Status  On-going            Plan - 09/23/18 1730    Clinical Impression Statement  Focused on improving dorsiflexion during today's session as patient has most trouble with ambulating long distances most notably when performing this quickly. Patient demonstrates improvement with dorsiflexion as she is able to perform dorsiflexion based exercises with a 5# weight. Although patient is improving, she continues to demonstrate difficulty walking and will benefit from furhter skilled therapy to return to prior level of function.    Rehab Potential  Good    Clinical Impairments Affecting Rehab Potential  (-) hx of MS; (+) good motivation    PT Frequency  2x / week    PT Duration  6 weeks    PT Treatment/Interventions  Moist Heat;Iontophoresis 4mg /ml Dexamethasone;Electrical Stimulation;Aquatic Therapy;Cryotherapy;Therapeutic activities;Neuromuscular re-education;Therapeutic exercise;Patient/family education;Dry needling;Manual techniques;Passive range of motion;Balance training;Functional mobility training;Gait training;Stair training;Joint Manipulations    PT Next Visit Plan  Continue addressing balance    PT Home Exercise Plan  See education    Consulted and Agree with Plan of Care  Patient       Patient will benefit from skilled therapeutic intervention in order to improve the following deficits and impairments:  Pain, Decreased coordination, Impaired perceived functional ability, Increased fascial restricitons, Increased muscle spasms, Difficulty walking, Abnormal gait, Decreased balance, Decreased mobility, Impaired sensation  Visit Diagnosis: 1. Difficulty in walking, not elsewhere classified   2. History of falling        Problem List There are no active problems to display for this patient.   [name], PT DPT 09/23/2018, 5:34 PM  [facility] [address] [place], [address] Phone: [phone]   Fax:  [phone]  Name: Michelle Ruiz MRN: [id number] Date of Birth: 04/08/1970

## 2018-09-26 ENCOUNTER — Encounter: Payer: Self-pay | Admitting: Obstetrics and Gynecology

## 2018-09-26 ENCOUNTER — Ambulatory Visit
Admission: RE | Admit: 2018-09-26 | Discharge: 2018-09-26 | Disposition: A | Discharge status: Home / Self Care | Payer: BC Managed Care – PPO | Source: Ambulatory Visit | Attending: Obstetrics and Gynecology | Admitting: Obstetrics and Gynecology

## 2018-09-26 ENCOUNTER — Other Ambulatory Visit: Payer: Self-pay

## 2018-09-26 DIAGNOSIS — R928 Other abnormal and inconclusive findings on diagnostic imaging of breast: Secondary | ICD-10-CM | POA: Insufficient documentation

## 2018-09-26 DIAGNOSIS — N6310 Unspecified lump in the right breast, unspecified quadrant: Secondary | ICD-10-CM

## 2018-09-26 DIAGNOSIS — R922 Inconclusive mammogram: Secondary | ICD-10-CM | POA: Diagnosis not present

## 2018-09-26 DIAGNOSIS — N6001 Solitary cyst of right breast: Secondary | ICD-10-CM | POA: Diagnosis not present

## 2018-09-30 ENCOUNTER — Ambulatory Visit: Payer: BLUE CROSS/BLUE SHIELD | Admitting: Obstetrics and Gynecology

## 2018-09-30 ENCOUNTER — Ambulatory Visit: Payer: BC Managed Care – PPO | Attending: Neurology

## 2018-09-30 ENCOUNTER — Other Ambulatory Visit: Payer: Self-pay

## 2018-09-30 DIAGNOSIS — Z9181 History of falling: Secondary | ICD-10-CM

## 2018-09-30 DIAGNOSIS — R262 Difficulty in walking, not elsewhere classified: Secondary | ICD-10-CM

## 2018-10-01 NOTE — Therapy (Signed)
[facility] [address] [place], [address] Phone: [phone]   Fax:  [phone]  Physical Therapy Treatment  Patient Details  Name: CAMRIE STOCK MRN: [id number] Date of Birth: 01-19-1971 Referring Provider (PT): Darrin Luis MD   Encounter Date: 09/30/2018  PT End of Session - 09/30/18 1747    Visit Number  36    Number of Visits  44    Date for PT Re-Evaluation  10/17/18    Authorization Type  BCBS    PT Start Time  1730    PT Stop Time  1815    PT Time Calculation (min)  45 min    Activity Tolerance  Patient tolerated treatment well    Behavior During Therapy  [facility] for tasks assessed/performed       Past Medical History:  Diagnosis Date  . Abnormal Pap smear of cervix   . Anxiety   . Kidney stones   . MS (multiple sclerosis) ([place])   . UTI (urinary tract infection)     Past Surgical History:  Procedure Laterality Date  . BREAST BIOPSY Right 2010   stereotactic biopsy/ neg/ dr.byrnett  . BREAST BIOPSY Left 2012   neg/dr. brynett  . CESAREAN SECTION  2008   RPH  . TONSILLECTOMY  2006    There were no vitals filed for this visit.  Subjective Assessment - 09/30/18 1742    Subjective  Patient states she was sore after the previous session. Patient reports she has been walking and have not had any near falls.    Patient is accompained by:  Family member    Pertinent History  Patient reports history of falls x3 in the past 6 months secondary to drop foot on the L LE. PMH: Multiple sclerosis; elbow fracture of the R UE    Limitations  Standing;Walking    How long can you stand comfortably?  20 min    How long can you walk comfortably?  1 miles     Patient Stated Goals  To be more balanced and core strengthening     Currently in Pain?  No/denies    Pain Onset  More than a month ago        TREATMENT  Therapeutic Exercise Side to side leg swings - x 23min B Forward leg swings - x 38min  B Walking lunges - 31ft x 4 Monster walks in standing with GTB around ankles with squats  - x 20 Dorsiflexion in standing with 5# weight - x 20 Walking on treadmill for total of 4.5 min @ 2.6, 2.9, 3.0 and 3.3 mph with UE support to build up speed for jogging with focus on improving  Ball toss with feet in semitandem - x 25 on airex beam Single leg stance EC - 6sec x 20 on R x10 on L  Performed exercises to address improvement in balance and LE strength most notably with performing walking. At fast speeds, patient demonstrates foot drop and less eccentric control.   PT Education - 09/30/18 1746    Education Details  form/technique with exercise    Person(s) Educated  Patient    Methods  Explanation;Demonstration    Comprehension  Verbalized understanding;Returned demonstration          PT Long Term Goals - 09/19/18 1712      PT LONG TERM GOAL #1   Title  Patient will be independent with balance HEP to continue benefits of therapy after discharge.  Baseline  dependent with form/technique for balance exercise; 03/25/2018: Independent     Time  6    Period  Weeks    Status  Achieved      PT LONG TERM GOAL #2   Title  Patient will improve FGA by 4 points to indicate significant improvement with balancing while ambulating and decrease of fall risk    Baseline  03/25/2018: 28/30    Time  6    Period  Weeks    Status  Achieved      PT LONG TERM GOAL #3   Title  Patient will be able to perform prolonged single leg stance >10sec with eyes closed to indicate improvement with static balance and decrease fall risk    Baseline  2 sec in standing EC; 03/25/2018: 3 sec; 04/29/2018: 4 sec; 08/21/2018: 5 sec; 09/19/2018: 6 sec    Time  6    Period  Weeks    Status  On-going      PT LONG TERM GOAL #4   Title   Pt will improve 6 MWT by 100 m (164ft) to demonstrate improved cardiorespiratory fitness    Baseline  Deferred to next session: 04/29/2018; 05/01/2018: 1487ft; 08/21/2018: 1421ft;  09/19/2018: 1464ft    Period  Weeks    Status  On-going            Plan - 09/30/18 1749    Clinical Impression Statement  Conitnued to focus on improving strength and coordination of the dorsiflexors with ambulating today. Patient demonstrates ability to perform faster ambulating on the treadmill compared to the previous session indicating improvement in muscular coordination. Patient demonstrates poor single leg balance most notably with eyes closed and continues to require frequent use of UEs. Patient will benefit from further skilled therapy to return to prior level of function.    Rehab Potential  Good    Clinical Impairments Affecting Rehab Potential  (-) hx of MS; (+) good motivation    PT Frequency  2x / week    PT Duration  6 weeks    PT Treatment/Interventions  Moist Heat;Iontophoresis 4mg /ml Dexamethasone;Electrical Stimulation;Aquatic Therapy;Cryotherapy;Therapeutic activities;Neuromuscular re-education;Therapeutic exercise;Patient/family education;Dry needling;Manual techniques;Passive range of motion;Balance training;Functional mobility training;Gait training;Stair training;Joint Manipulations    PT Next Visit Plan  Continue addressing balance    PT Home Exercise Plan  See education    Consulted and Agree with Plan of Care  Patient       Patient will benefit from skilled therapeutic intervention in order to improve the following deficits and impairments:  Pain, Decreased coordination, Impaired perceived functional ability, Increased fascial restricitons, Increased muscle spasms, Difficulty walking, Abnormal gait, Decreased balance, Decreased mobility, Impaired sensation  Visit Diagnosis: 1. Difficulty in walking, not elsewhere classified   2. History of falling        Problem List There are no active problems to display for this patient.   [name], PT DPT 10/01/2018, 9:21 AM  [facility] [address] [place], [address] Phone: [phone]   Fax:  [phone]  Name: VALERYA MAXTON MRN: [id number] Date of Birth: 09/10/1970

## 2018-10-03 ENCOUNTER — Ambulatory Visit: Payer: BC Managed Care – PPO

## 2018-10-03 ENCOUNTER — Other Ambulatory Visit: Payer: Self-pay

## 2018-10-03 DIAGNOSIS — Z9181 History of falling: Secondary | ICD-10-CM

## 2018-10-03 DIAGNOSIS — R262 Difficulty in walking, not elsewhere classified: Secondary | ICD-10-CM

## 2018-10-03 NOTE — Therapy (Signed)
[facility] [address] [place], [address] Phone: [phone]   Fax:  [phone]  Physical Therapy Treatment  Patient Details  Name: Michelle Ruiz MRN: [id number] Date of Birth: [birth year] Referring Provider (PT): Darrin Luis MD   Encounter Date: 10/03/2018  PT End of Session - 10/03/18 1754    Visit Number  37    Number of Visits  44    Date for PT Re-Evaluation  10/17/18    Authorization Type  BCBS    PT Start Time  1700    PT Stop Time  7341    PT Time Calculation (min)  45 min    Activity Tolerance  Patient tolerated treatment well    Behavior During Therapy  [facility] for tasks assessed/performed       Past Medical History:  Diagnosis Date   Abnormal Pap smear of cervix    Anxiety    Kidney stones    MS (multiple sclerosis) ([place])    UTI (urinary tract infection)     Past Surgical History:  Procedure Laterality Date   BREAST BIOPSY Right 2010   stereotactic biopsy/ neg/ dr.byrnett   BREAST BIOPSY Left 2012   neg/dr. brynett   CESAREAN SECTION  2008   [facility]   TONSILLECTOMY  2006    There were no vitals filed for this visit.  Subjective Assessment - 10/03/18 1749    Subjective  Patient states she was sore after the previous session but otherwise recovered well.    Pertinent History  Patient reports history of falls x3 in the past 6 months secondary to drop foot on the L LE. PMH: Multiple sclerosis; elbow fracture of the R UE    Limitations  Standing;Walking    How long can you stand comfortably?  20 min    How long can you walk comfortably?  1 miles     Patient Stated Goals  To be more balanced and core strengthening     Currently in Pain?  No/denies    Pain Score  0-No pain    Multiple Pain Sites  No           [facility] Adult PT Treatment/Exercise - 10/03/18 1752      Ambulation/Gait   Gait Comments  Decreased dorsiflexion on the L LE with amb, decreased speed, decreased push  off on bilateral (L>R) LE      Posture/Postural Control   Posture Comments  Forward rounded shoulders, forward head posture       Neuromuscular Re-education to address and improve her strength, dynamic/static postural stability, and independence with functional mobility.  Side-to-side leg swings, 1 min B with UE support Forward/back leg swings, 1 min B with UE support Agility Ladder bounding 30 feet x8 Pogo stick unilateral hopping 2x10 each leg with UE support Penguin walking (walking on heels) 50 feet x6 Monster walks GTB at ankles with squats, 50 feet x12 with throw/catch coordination SLS eyes closed, 3 minutes total, able to balance ~2 sec L, ~8 sec R SLS eyes open, 3 minutes total, able to balance ~10 sec L, ~20 sec R Tandem walking 50 feet x1, with FTN coordination component 50 feet x2  Gait training to address and improve her gait mechanics and activity tolerance.   Treadmill training, 8 minutes total: 2 min warm-up and cool-down, 90 second stages @ 2.6, 2.9, and 3.1 speed. With UE support       PT Education - 10/03/18 1753  Education Details  Form/technique with exercise. Cued to increase stride length/step height and cadence (audio).    Person(s) Educated  Patient    Methods  Explanation;Verbal cues    Comprehension  Verbalized understanding;Verbal cues required          PT Long Term Goals - 09/19/18 1712      PT LONG TERM GOAL #1   Title  Patient will be independent with balance HEP to continue benefits of therapy after discharge.    Baseline  dependent with form/technique for balance exercise; 03/25/2018: Independent     Time  6    Period  Weeks    Status  Achieved      PT LONG TERM GOAL #2   Title  Patient will improve FGA by 4 points to indicate significant improvement with balancing while ambulating and decrease of fall risk    Baseline  03/25/2018: 28/30    Time  6    Period  Weeks    Status  Achieved      PT LONG TERM GOAL #3   Title  Patient  will be able to perform prolonged single leg stance >10sec with eyes closed to indicate improvement with static balance and decrease fall risk    Baseline  2 sec in standing EC; 03/25/2018: 3 sec; 04/29/2018: 4 sec; 08/21/2018: 5 sec; 09/19/2018: 6 sec    Time  6    Period  Weeks    Status  On-going      PT LONG TERM GOAL #4   Title   Pt will improve 6 MWT by 100 m (118ft) to demonstrate improved cardiorespiratory fitness    Baseline  Deferred to next session: 04/29/2018; 05/01/2018: 1466ft; 08/21/2018: 1466ft; 09/19/2018: 1461ft    Period  Weeks    Status  On-going            Plan - 10/03/18 1801    Clinical Impression Statement  Focused on improving activity tolerance through varying loads and intensities. Pt demonstrated slight improvement in coordination with agility ladder exercises and pogo stick jumping, gait cadence (decreased foot slap bilaterally) during treadmill training, and single limb stance eyes open and eyes closed. Pt demonstrates improved confidence with static balance exercises. Pt demonstrated good dynamic balance with tandem walking then additional FTN coordination simultaneously. Pt demonstrated difficulty with coordination tasks, as well as dual tasking in general while ambulating. Pt still demonstrates increased difficulty with eyes closed static balance and plyometric exercises L>R, although pt required less UE support during said exercises compared to recent sessions. Pt will benefit from further skilled therapy treatment to return to prior level of function and to improve all aforementioned impairments.    Personal Factors and Comorbidities  Past/Current Experience    Examination-Activity Limitations  Locomotion Level    Examination-Participation Restrictions  Community Activity    Stability/Clinical Decision Making  Stable/Uncomplicated    Clinical Decision Making  Low    Clinical Presentation due to:  Stable presentation, unchanging status    Rehab Potential  Good     Clinical Impairments Affecting Rehab Potential  (-) hx of MS; (+) good motivation    PT Frequency  2x / week    PT Duration  6 weeks    PT Treatment/Interventions  Moist Heat;Iontophoresis 4mg /ml Dexamethasone;Electrical Stimulation;Aquatic Therapy;Cryotherapy;Therapeutic activities;Neuromuscular re-education;Therapeutic exercise;Patient/family education;Dry needling;Manual techniques;Passive range of motion;Balance training;Functional mobility training;Gait training;Stair training;Joint Manipulations    PT Next Visit Plan  Continue addressing static (eyes closed), dynamic balance exercises, plyometric power movements, as well  as incorporating more dual tasking and coordination activities.    PT Home Exercise Plan  See education    Consulted and Agree with Plan of Care  Patient       Patient will benefit from skilled therapeutic intervention in order to improve the following deficits and impairments:  Pain, Decreased coordination, Impaired perceived functional ability, Increased fascial restricitons, Increased muscle spasms, Difficulty walking, Abnormal gait, Decreased balance, Decreased mobility, Impaired sensation  Visit Diagnosis: 1. Difficulty in walking, not elsewhere classified        Problem List There are no active problems to display for this patient.   Scarlette Calico, SPT 10/03/2018, 6:04 PM  [facility] [address] [place], [address] Phone: [phone]   Fax:  [phone]  Name: Michelle Ruiz MRN: [id number] Date of Birth: 1970/05/17

## 2018-10-07 ENCOUNTER — Ambulatory Visit: Payer: BC Managed Care – PPO

## 2018-10-10 ENCOUNTER — Ambulatory Visit: Payer: BC Managed Care – PPO

## 2018-10-10 ENCOUNTER — Other Ambulatory Visit: Payer: Self-pay

## 2018-10-10 DIAGNOSIS — Z9181 History of falling: Secondary | ICD-10-CM | POA: Diagnosis not present

## 2018-10-10 DIAGNOSIS — R262 Difficulty in walking, not elsewhere classified: Secondary | ICD-10-CM

## 2018-10-10 NOTE — Therapy (Signed)
[facility] [address], [place], [address] Phone: [phone]   Fax:  [phone]  Physical Therapy Treatment  Patient Details  Name: Michelle Ruiz MRN: [id number] Date of Birth: 1970-05-25 Referring Provider (PT): Darrin Luis MD   Encounter Date: 10/10/2018  PT End of Session - 10/10/18 1809    Visit Number  38    Number of Visits  44    Date for PT Re-Evaluation  10/17/18    Authorization Type  BCBS    PT Start Time  1700    PT Stop Time  1745    PT Time Calculation (min)  45 min    Activity Tolerance  Patient tolerated treatment well    Behavior During Therapy  [facility] for tasks assessed/performed       Past Medical History:  Diagnosis Date  . Abnormal Pap smear of cervix   . Anxiety   . Kidney stones   . MS (multiple sclerosis) ([place])   . UTI (urinary tract infection)     Past Surgical History:  Procedure Laterality Date  . BREAST BIOPSY Right 2010   stereotactic biopsy/ neg/ dr.byrnett  . BREAST BIOPSY Left 2012   neg/dr. brynett  . CESAREAN SECTION  2008   [facility]  . TONSILLECTOMY  2006    There were no vitals filed for this visit.  Subjective Assessment - 10/10/18 1730    Subjective  Patient reports she was significantly sore after the previous session. Patient reports a slight increase in knee pain.    Pertinent History  Patient reports history of falls x3 in the past 6 months secondary to drop foot on the L LE. PMH: Multiple sclerosis; elbow fracture of the R UE    Limitations  Standing;Walking    How long can you stand comfortably?  20 min    How long can you walk comfortably?  1 miles     Patient Stated Goals  To be more balanced and core strengthening     Currently in Pain?  No/denies       TREATMENT Therapeutic Exercise Ambulation with increased dorsiflexion -- 10 x 38ft Ball toss with feet on airex pad -- x 20 Standing dorsiflexion with B 10# weight on each ankle -- x 20 Sit  to stand from chair -- x 15 Hip abduction in standing -- x 20 Hip external in standing -- x20 Resisted dorsiflexion -- x 20   Patient demonstrates improvement in dorsiflexion after performing resisted dorsiflexion   PT Education - 10/10/18 1808    Education Details  form/technique with exercise    Person(s) Educated  Patient    Methods  Explanation;Demonstration    Comprehension  Verbalized understanding;Returned demonstration          PT Long Term Goals - 09/19/18 1712      PT LONG TERM GOAL #1   Title  Patient will be independent with balance HEP to continue benefits of therapy after discharge.    Baseline  dependent with form/technique for balance exercise; 03/25/2018: Independent     Time  6    Period  Weeks    Status  Achieved      PT LONG TERM GOAL #2   Title  Patient will improve FGA by 4 points to indicate significant improvement with balancing while ambulating and decrease of fall risk    Baseline  03/25/2018: 28/30    Time  6    Period  Weeks  Status  Achieved      PT LONG TERM GOAL #3   Title  Patient will be able to perform prolonged single leg stance >10sec with eyes closed to indicate improvement with static balance and decrease fall risk    Baseline  2 sec in standing EC; 03/25/2018: 3 sec; 04/29/2018: 4 sec; 08/21/2018: 5 sec; 09/19/2018: 6 sec    Time  6    Period  Weeks    Status  On-going      PT LONG TERM GOAL #4   Title   Pt will improve 6 MWT by 100 m (131ft) to demonstrate improved cardiorespiratory fitness    Baseline  Deferred to next session: 04/29/2018; 05/01/2018: 1476ft; 08/21/2018: 1419ft; 09/19/2018: 1450ft    Period  Weeks    Status  On-going            Plan - 10/10/18 1809    Clinical Impression Statement  Focused today's session on improving dorsiflexion on the affected side as she continues to have decreased foot clearance on the L LE. Patient demonstrates improvement in muscular activation after performing tactile cueing to the  musculature. Patient demonstrates less fatigue today compared to previous sessions and patient will benefit from further skilled therapy to return to prior level of function.    Personal Factors and Comorbidities  Past/Current Experience    Examination-Activity Limitations  Locomotion Level    Examination-Participation Restrictions  Community Activity    Stability/Clinical Decision Making  Stable/Uncomplicated    Rehab Potential  Good    Clinical Impairments Affecting Rehab Potential  (-) hx of MS; (+) good motivation    PT Frequency  2x / week    PT Duration  6 weeks    PT Treatment/Interventions  Moist Heat;Iontophoresis 4mg /ml Dexamethasone;Electrical Stimulation;Aquatic Therapy;Cryotherapy;Therapeutic activities;Neuromuscular re-education;Therapeutic exercise;Patient/family education;Dry needling;Manual techniques;Passive range of motion;Balance training;Functional mobility training;Gait training;Stair training;Joint Manipulations    PT Next Visit Plan  Continue addressing static (eyes closed), dynamic balance exercises, plyometric power movements, as well as incorporating more dual tasking and coordination activities.    PT Home Exercise Plan  See education    Consulted and Agree with Plan of Care  Patient       Patient will benefit from skilled therapeutic intervention in order to improve the following deficits and impairments:  Pain, Decreased coordination, Impaired perceived functional ability, Increased fascial restricitons, Increased muscle spasms, Difficulty walking, Abnormal gait, Decreased balance, Decreased mobility, Impaired sensation  Visit Diagnosis: 1. Difficulty in walking, not elsewhere classified   2. History of falling        Problem List There are no active problems to display for this patient.   [name], PT DPT 10/10/2018, 6:12 PM  [facility] [address], [place],  [address] Phone: [phone]   Fax:  [phone]  Name: Michelle Ruiz MRN: [id number] Date of Birth: 1970/07/06

## 2018-10-11 ENCOUNTER — Encounter: Payer: Self-pay | Admitting: Obstetrics and Gynecology

## 2018-10-11 ENCOUNTER — Other Ambulatory Visit (HOSPITAL_COMMUNITY)
Admission: RE | Admit: 2018-10-11 | Discharge: 2018-10-11 | Disposition: A | Discharge status: Home / Self Care | Payer: BC Managed Care – PPO | Source: Ambulatory Visit | Attending: Obstetrics and Gynecology | Admitting: Obstetrics and Gynecology

## 2018-10-11 ENCOUNTER — Ambulatory Visit (INDEPENDENT_AMBULATORY_CARE_PROVIDER_SITE_OTHER): Payer: BC Managed Care – PPO | Admitting: Obstetrics and Gynecology

## 2018-10-11 VITALS — BP 120/70 | HR 92 | Ht 69.0 in | Wt 186.0 lb

## 2018-10-11 DIAGNOSIS — Z1211 Encounter for screening for malignant neoplasm of colon: Secondary | ICD-10-CM

## 2018-10-11 DIAGNOSIS — Z124 Encounter for screening for malignant neoplasm of cervix: Secondary | ICD-10-CM | POA: Diagnosis not present

## 2018-10-11 DIAGNOSIS — Z01419 Encounter for gynecological examination (general) (routine) without abnormal findings: Secondary | ICD-10-CM | POA: Diagnosis not present

## 2018-10-11 DIAGNOSIS — Z1239 Encounter for other screening for malignant neoplasm of breast: Secondary | ICD-10-CM

## 2018-10-11 DIAGNOSIS — Z0000 Encounter for general adult medical examination without abnormal findings: Secondary | ICD-10-CM

## 2018-10-11 NOTE — Progress Notes (Signed)
Gynecology Annual Exam  PCP: Chad Cordial, PA-C  Chief Complaint:  Chief Complaint  Patient presents with  . Gynecologic Exam    History of Present Illness: Patient is a 48 y.o. G2P1011 presents for annual exam. The patient has no complaints today.   LMP: Patient's last menstrual period was 09/23/2018 (exact date). Average Interval: regular, 25 days Duration of flow: 5 days Heavy Menses: no Clots: no Intermenstrual Bleeding: no Postcoital Bleeding: no Dysmenorrhea: no   The patient is sexually active. She currently uses none for contraception. She denies dyspareunia.  The patient does perform self breast exams.  There is no notable family history of breast or ovarian cancer in her family.  The patient wears seatbelts: yes.   The patient has regular exercise: yes, bicycle and PT for balance.    The patient denies current symptoms of depression.    Review of Systems: ROS  Past Medical History:  Past Medical History:  Diagnosis Date  . Abnormal Pap smear of cervix   . Anxiety   . Kidney stones   . MS (multiple sclerosis) ([place])   . UTI (urinary tract infection)     Past Surgical History:  Past Surgical History:  Procedure Laterality Date  . BREAST BIOPSY Right 2010   stereotactic biopsy/ neg/ dr.byrnett  . BREAST BIOPSY Left 2012   neg/dr. brynett  . CESAREAN SECTION  2008   [facility]  . TONSILLECTOMY  2006    Gynecologic History:  Patient's last menstrual period was 09/23/2018 (exact date). Contraception: none Last Pap: Results were: 2017 Last mammogram: 09/26/2018 Results were: BI-RAD II  Obstetric History: G2P1011  Family History:  Family History  Problem Relation Age of Onset  . Endometrial cancer Mother 75  . Multiple sclerosis Sister 62  . Cancer Sister 35       appendix  . Autism Son        Infantile  . Diabetes type II Other        Aunt  . Bladder Cancer Neg Hx   . Kidney cancer Neg Hx     Social History:  Social History    Socioeconomic History  . Marital status: Married    Spouse name: Not on file  . Number of children: Not on file  . Years of education: Not on file  . Highest education level: Not on file  Occupational History  . Not on file  Social Needs  . Financial resource strain: Not on file  . Food insecurity    Worry: Not on file    Inability: Not on file  . Transportation needs    Medical: Not on file    Non-medical: Not on file  Tobacco Use  . Smoking status: Never Smoker  . Smokeless tobacco: Never Used  Substance and Sexual Activity  . Alcohol use: Not Currently    Alcohol/week: 4.0 standard drinks    Types: 4 Glasses of wine per week  . Drug use: No  . Sexual activity: Yes    Birth control/protection: Surgical    Comment: Vasectomy  Lifestyle  . Physical activity    Days per week: Not on file    Minutes per session: Not on file  . Stress: Not on file  Relationships  . Social Herbalist on phone: Not on file    Gets together: Not on file    Attends religious service: Not on file    Active member of club or organization: Not on file  Attends meetings of clubs or organizations: Not on file    Relationship status: Not on file  . Intimate partner violence    Fear of current or ex partner: Not on file    Emotionally abused: Not on file    Physically abused: Not on file    Forced sexual activity: Not on file  Other Topics Concern  . Not on file  Social History Narrative  . Not on file    Allergies:  Allergies  Allergen Reactions  . Amoxicillin Other (See Comments)  . Other Other (See Comments)    Mushrooms - SOB  . Sulfa Antibiotics     Other reaction(s): Unknown    Medications: Prior to Admission medications   Medication Sig Start Date End Date Taking? Authorizing Provider  Ascorbic Acid (VITAMIN C) 1000 MG tablet Take 1,000 mg by mouth daily.   Yes [provider]  baclofen (LIORESAL) 10 MG tablet 10 mg 2 (two) times daily. 08/31/14  Yes  [provider]  buPROPion (WELLBUTRIN SR) 100 MG 12 hr tablet TAKE 1 TABLET BY MOUTH TWICE DAILY AT 8AM AND 3PM 03/18/17  Yes [provider]  Cholecalciferol (PA VITAMIN D-3) 2000 UNITS CAPS Take 2,000 Units by mouth.   Yes [provider]  clonazePAM (KLONOPIN) 0.5 MG tablet TAKE 1 TABLET BY MOUTH EVERY DAY AS NEEDED **STOP XANAX** 02/22/17  Yes [provider]  cyanocobalamin 2000 MCG tablet Take 2,000 mcg by mouth daily.   Yes [provider]  dalfampridine (AMPYRA) 10 MG TB12 Take 10 mg by mouth 2 (two) times daily.   Yes [provider]  Dimethyl Fumarate (TECFIDERA) 240 MG CPDR 240 mg 1 day or 1 dose. 07/27/14  Yes [provider]  sertraline (ZOLOFT) 50 MG tablet Take 100 mg by mouth.   Yes [provider]    Physical Exam Vitals: Blood pressure 120/70, pulse 92, height 5\' 9"  (1.753 m), weight 186 lb (84.4 kg), last menstrual period 09/23/2018.  General: NAD HEENT: normocephalic, anicteric Thyroid: no enlargement, no palpable nodules Pulmonary: No increased work of breathing, CTAB Cardiovascular: RRR, distal pulses 2+ Breast: Breast symmetrical, no tenderness, no palpable nodules or masses, no skin or nipple retraction present, no nipple discharge.  No axillary or supraclavicular lymphadenopathy. Abdomen: NABS, soft, non-tender, non-distended.  Umbilicus without lesions.  No hepatomegaly, splenomegaly or masses palpable. No evidence of hernia  Genitourinary:  External: Normal external female genitalia.  Normal urethral meatus, normal Bartholin's and Skene's glands.    Vagina: Normal vaginal mucosa, no evidence of prolapse.    Cervix: Grossly normal in appearance, no bleeding  Uterus: Non-enlarged, mobile, normal contour.  No CMT  Adnexa: ovaries non-enlarged, no adnexal masses  Rectal: deferred  Lymphatic: no evidence of inguinal lymphadenopathy Extremities: no edema, erythema, or tenderness Neurologic:  Grossly intact Psychiatric: mood appropriate, affect full  Female chaperone present for pelvic and breast  portions of the physical exam    Assessment: 48 y.o. G2P1011 routine annual exam  Plan: Problem List Items Addressed This Visit    None    Visit Diagnoses    Healthcare maintenance    -  Primary   Cervical cancer screening       Relevant Orders   Cytology - PAP   Colon cancer screening       Relevant Orders   Ambulatory referral to Gastroenterology   Screening for breast cancer       Encounter for cervical Pap smear with pelvic exam  1) Mammogram - recommend yearly screening mammogram.  Mammogram Is up to date   2) STI screening  was notoffered and therefore not obtained  3) ASCCP guidelines and rational discussed.  Patient opts for every 3 years screening interval  4) Contraception - the patient is currently using  none.  She is happy with her current form of contraception and plans to continue  5) Colonoscopy -- Screening recommended starting at age 65 for average risk individuals, age 35 for individuals deemed at increased risk (including African Americans) and recommended to continue until age 67.  For patient age 39-85 individualized approach is recommended.  Gold standard screening is via colonoscopy, Cologuard screening is an acceptable alternative for patient unwilling or unable to undergo colonoscopy.  "Colorectal cancer screening for average?risk adults: 2018 guideline update from the American Cancer Society"CA: A Cancer Journal for Clinicians: Aug 23, 2016   6) Routine healthcare maintenance including cholesterol, diabetes screening discussed managed by neurologist, WNL  7) Return in about 1 year (around 10/11/2019) for annual.  Adrian Prows MD [place] 10/11/2018 9:03 AM

## 2018-10-15 DIAGNOSIS — L820 Inflamed seborrheic keratosis: Secondary | ICD-10-CM | POA: Diagnosis not present

## 2018-10-15 DIAGNOSIS — Z85828 Personal history of other malignant neoplasm of skin: Secondary | ICD-10-CM | POA: Diagnosis not present

## 2018-10-15 DIAGNOSIS — L578 Other skin changes due to chronic exposure to nonionizing radiation: Secondary | ICD-10-CM | POA: Diagnosis not present

## 2018-10-15 DIAGNOSIS — L570 Actinic keratosis: Secondary | ICD-10-CM | POA: Diagnosis not present

## 2018-10-15 DIAGNOSIS — Z1283 Encounter for screening for malignant neoplasm of skin: Secondary | ICD-10-CM | POA: Diagnosis not present

## 2018-10-15 DIAGNOSIS — D1800 Hemangioma unspecified site: Secondary | ICD-10-CM | POA: Diagnosis not present

## 2018-10-15 LAB — CYTOLOGY - PAP
Diagnosis: NEGATIVE
HPV: NOT DETECTED

## 2018-10-16 ENCOUNTER — Other Ambulatory Visit: Payer: Self-pay

## 2018-10-16 ENCOUNTER — Ambulatory Visit: Payer: BC Managed Care – PPO

## 2018-10-16 DIAGNOSIS — Z9181 History of falling: Secondary | ICD-10-CM

## 2018-10-16 DIAGNOSIS — R262 Difficulty in walking, not elsewhere classified: Secondary | ICD-10-CM | POA: Diagnosis not present

## 2018-10-16 NOTE — Therapy (Signed)
[facility] [address] [place], [address] Phone: [phone]   Fax:  [phone]  Physical Therapy Treatment  Patient Details  Name: Michelle Ruiz MRN: [id number] Date of Birth: [birth year] Referring Provider (PT): Darrin Luis MD   Encounter Date: 10/16/2018  PT End of Session - 10/16/18 1758    Visit Number  39    Number of Visits  44    Date for PT Re-Evaluation  10/17/18    Authorization Type  BCBS    PT Start Time  1745    PT Stop Time  1830    PT Time Calculation (min)  45 min    Activity Tolerance  Patient tolerated treatment well    Behavior During Therapy  [facility] for tasks assessed/performed       Past Medical History:  Diagnosis Date  . Abnormal Pap smear of cervix   . Anxiety   . Kidney stones   . MS (multiple sclerosis) ([place])   . UTI (urinary tract infection)     Past Surgical History:  Procedure Laterality Date  . BREAST BIOPSY Right 2010   stereotactic biopsy/ neg/ dr.byrnett  . BREAST BIOPSY Left 2012   neg/dr. brynett  . CESAREAN SECTION  2008   [facility]  . TONSILLECTOMY  2006    There were no vitals filed for this visit.  Subjective Assessment - 10/16/18 1754    Subjective  Patient states she is no longer having fatigue with exercise. Patient reports no significant changes otherwise.    Pertinent History  Patient reports history of falls x3 in the past 6 months secondary to drop foot on the L LE. PMH: Multiple sclerosis; elbow fracture of the R UE    Limitations  Standing;Walking    How long can you stand comfortably?  20 min    How long can you walk comfortably?  1 miles     Patient Stated Goals  To be more balanced and core strengthening     Currently in Pain?  No/denies       TREATMENT  Therapeutic Exercise Side to side leg swings - x 98min B Forward leg swings - x 31min B Standing squats with UE support - x 25 Dorsiflexion in standing with 5# weight - x 35 Walking  lunges - 52ft x 4 Single leg stance EC - 6sec x 20 on R x10 on L Stepping with skipping a square on agility ladder - x 5 down and back  Ball toss with feet in semitandem - x 25 on airex beam   Performed exercises to address improvement in balance and LE strength most notably with performing walking. At fast speeds, patient demonstrates foot drop and less eccentric control.   PT Education - 10/16/18 1758    Education Details  form/technique with exercise    Person(s) Educated  Patient    Methods  Explanation;Demonstration    Comprehension  Verbalized understanding;Returned demonstration          PT Long Term Goals - 09/19/18 1712      PT LONG TERM GOAL #1   Title  Patient will be independent with balance HEP to continue benefits of therapy after discharge.    Baseline  dependent with form/technique for balance exercise; 03/25/2018: Independent     Time  6    Period  Weeks    Status  Achieved      PT LONG TERM GOAL #2   Title  Patient  will improve FGA by 4 points to indicate significant improvement with balancing while ambulating and decrease of fall risk    Baseline  03/25/2018: 28/30    Time  6    Period  Weeks    Status  Achieved      PT LONG TERM GOAL #3   Title  Patient will be able to perform prolonged single leg stance >10sec with eyes closed to indicate improvement with static balance and decrease fall risk    Baseline  2 sec in standing EC; 03/25/2018: 3 sec; 04/29/2018: 4 sec; 08/21/2018: 5 sec; 09/19/2018: 6 sec    Time  6    Period  Weeks    Status  On-going      PT LONG TERM GOAL #4   Title   Pt will improve 6 MWT by 100 m (173ft) to demonstrate improved cardiorespiratory fitness    Baseline  Deferred to next session: 04/29/2018; 05/01/2018: 1460ft; 08/21/2018: 149ft; 09/19/2018: 1467ft    Period  Weeks    Status  On-going            Plan - 10/16/18 1813    Clinical Impression Statement  Patient demonstrates improvement with dorsiflexion today versus previous  session and continued to address single leg stance balance both statically and dynamically. Patient continues to have difficulty with single leg stance statically and dynamically indicating poor stability contributing to a decrease in ability to ambulate. Patient will benefit from further skilled therapy to return to prior level of function.    Personal Factors and Comorbidities  Past/Current Experience    Examination-Activity Limitations  Locomotion Level    Examination-Participation Restrictions  Community Activity    Stability/Clinical Decision Making  Stable/Uncomplicated    Rehab Potential  Good    Clinical Impairments Affecting Rehab Potential  (-) hx of MS; (+) good motivation    PT Frequency  2x / week    PT Duration  6 weeks    PT Treatment/Interventions  Moist Heat;Iontophoresis 4mg /ml Dexamethasone;Electrical Stimulation;Aquatic Therapy;Cryotherapy;Therapeutic activities;Neuromuscular re-education;Therapeutic exercise;Patient/family education;Dry needling;Manual techniques;Passive range of motion;Balance training;Functional mobility training;Gait training;Stair training;Joint Manipulations    PT Next Visit Plan  Continue addressing static (eyes closed), dynamic balance exercises, plyometric power movements, as well as incorporating more dual tasking and coordination activities.    PT Home Exercise Plan  See education    Consulted and Agree with Plan of Care  Patient       Patient will benefit from skilled therapeutic intervention in order to improve the following deficits and impairments:  Pain, Decreased coordination, Impaired perceived functional ability, Increased fascial restricitons, Increased muscle spasms, Difficulty walking, Abnormal gait, Decreased balance, Decreased mobility, Impaired sensation  Visit Diagnosis: 1. Difficulty in walking, not elsewhere classified   2. History of falling        Problem List There are no active problems to display for this  patient.   [name], PT DPT 10/16/2018, 6:30 PM  [facility] [address], [place], [address] Phone: [phone]   Fax:  [phone]  Name: Michelle Ruiz MRN: [id number] Date of Birth: 1970-11-02

## 2018-10-21 ENCOUNTER — Other Ambulatory Visit: Payer: Self-pay

## 2018-10-21 ENCOUNTER — Encounter: Payer: Self-pay | Admitting: *Deleted

## 2018-10-21 ENCOUNTER — Ambulatory Visit: Payer: BC Managed Care – PPO

## 2018-10-21 DIAGNOSIS — R262 Difficulty in walking, not elsewhere classified: Secondary | ICD-10-CM

## 2018-10-21 DIAGNOSIS — Z9181 History of falling: Secondary | ICD-10-CM

## 2018-10-21 NOTE — Therapy (Signed)
[facility] [address], [place], [address] Phone: [phone]   Fax:  [phone]  Physical Therapy Treatment, Progress Note and Re-certification  Dates of reporting period  09/19/2018   to   10/21/2018    Patient Details  Name: Michelle Ruiz MRN: [id number] Date of Birth: [birth year] Referring Provider (PT): Darrin Luis MD   Encounter Date: 10/21/2018  PT End of Session - 10/21/18 1730    Visit Number  40    Number of Visits  44    Authorization Type  BCBS    PT Start Time  3295    PT Stop Time  1814    PT Time Calculation (min)  43 min    Activity Tolerance  Patient tolerated treatment well    Behavior During Therapy  [facility] for tasks assessed/performed       Past Medical History:  Diagnosis Date  . Abnormal Pap smear of cervix   . Anxiety   . Kidney stones   . MS (multiple sclerosis) ([place])   . UTI (urinary tract infection)     Past Surgical History:  Procedure Laterality Date  . BREAST BIOPSY Right 2010   stereotactic biopsy/ neg/ dr.byrnett  . BREAST BIOPSY Left 2012   neg/dr. brynett  . CESAREAN SECTION  2008   [facility]  . TONSILLECTOMY  2006    There were no vitals filed for this visit.  Subjective Assessment - 10/21/18 1732    Subjective  Patient reported that she had fatigue "for days" two weeks ago until the middle of last week. Stated that now she feels better. Stated that she feels 75-80% since start of evaluation. Stated she has felt an improvement in balance, strength, and  confidence. Pt would like to continue with PT to continue to progress.    Patient is accompained by:  Family member    Pertinent History  Patient reports history of falls x3 in the past 6 months secondary to drop foot on the L LE. PMH: Multiple sclerosis; elbow fracture of the R UE    Limitations  Standing;Walking    How long can you stand comfortably?  20 min    How long can you walk comfortably?  1 miles    Patient Stated Goals  To be more balanced and core strengthening     Currently in Pain?  No/denies       TREATMENT   HEP updated:  Access Code: [email or web address]  URL: [email or web address]  Date: 10/21/2018  Prepared by: Lieutenant Diego   Therapeutic exercises: Mini Squat with Counter Support - 10 reps - 3 sets - 1x daily - 7x weekly Toe Raises with Counter Support - 20 reps - 2 sets - 1x daily - 7x weekly Static Lunge - 10 reps - 1 sets - 1x daily - 7x weekly Penguin walking 3 rounds 39ft (DF during ambulation, (heel walking)   Neuromuscular re-ed: Standing hip flexion/extension x45sec ea side focused on balance/hip control 2 finger support Standing Single Leg Stance with Counter Support - 3 reps - 1 sets- 10 hold - 1x daily - 7x weekly Standing Romberg to 1/2 Tandem Stance - 4 reps - 1 sets - 20 hold - 1x daily - 7x weekly   Pt response/clinical impression: Patient continued to demonstrated progression towards goals as well as improved functional abilities. The patient still exhibits fatigue with prolonged activities and difficulty with dynamic balance activities. The patient would benefit from continued skilled PT to  maximize pt mobility, safety, and maintenance against natural progression of disease.           PT Education - 10/21/18 1728    Education Details  form/technique, HEP, POC    Person(s) Educated  Patient    Methods  Explanation;Demonstration    Comprehension  Verbalized understanding;Returned demonstration          PT Long Term Goals - 10/21/18 1735      PT LONG TERM GOAL #1   Title  Patient will be independent with balance HEP to continue benefits of therapy after discharge.    Baseline  dependent with form/technique for balance exercise; 03/25/2018: Independent     Time  6    Period  Weeks    Status  Achieved    Target Date  03/20/18      PT LONG TERM GOAL #2   Title  Patient will improve FGA by 4 points to indicate significant  improvement with balancing while ambulating and decrease of fall risk    Baseline  03/25/2018: 28/30    Time  6    Period  Weeks    Status  Achieved    Target Date  03/20/18      PT LONG TERM GOAL #3   Title  Patient will be able to perform prolonged single leg stance >10sec with eyes closed to indicate improvement with static balance and decrease fall risk    Baseline  2 sec in standing EC; 03/25/2018: 3 sec; 04/29/2018: 4 sec; 08/21/2018: 5 sec; 09/19/2018: 6 sec ; 10/21/2018 3 sec    Time  4    Period  Weeks    Status  On-going    Target Date  11/18/18      PT LONG TERM GOAL #4   Title  Pt will improve 6 MWT >2034ft to demonstrate improved cardiorespiratory fitness    Baseline  Deferred to next session: 04/29/2018; 05/01/2018: 1478ft; 08/21/2018: 1419ft; 09/19/2018: 1466ft; 10/21/2018 1621    Time  4    Period  Weeks    Status  On-going    Target Date  11/18/18            Plan - 10/21/18 1729    Clinical Impression Statement  Patient continued to demonstrated progression towards goals as well as improved functional abilities. The patient still exhibits fatigue with prolonged activities and difficulty with dynamic balance activities. The patient would benefit from continued skilled PT to maximize pt mobility, safety, and maintenance against natural progression of disease.    Personal Factors and Comorbidities  Past/Current Experience    Examination-Activity Limitations  Locomotion Level    Examination-Participation Restrictions  Community Activity    Stability/Clinical Decision Making  Stable/Uncomplicated    Rehab Potential  Good    Clinical Impairments Affecting Rehab Potential  (-) hx of MS; (+) good motivation    PT Frequency  2x / week    PT Duration  4 weeks    PT Treatment/Interventions  Moist Heat;Iontophoresis 4mg /ml Dexamethasone;Electrical Stimulation;Aquatic Therapy;Cryotherapy;Therapeutic activities;Neuromuscular re-education;Therapeutic exercise;Patient/family  education;Dry needling;Manual techniques;Passive range of motion;Balance training;Functional mobility training;Gait training;Stair training;Joint Manipulations    PT Next Visit Plan  Continue addressing static (eyes closed), dynamic balance exercises, plyometric power movements, as well as incorporating more dual tasking and coordination activities.    PT Home Exercise Plan  See education    Consulted and Agree with Plan of Care  Patient       Patient will benefit from skilled therapeutic intervention in order  to improve the following deficits and impairments:  Pain, Decreased coordination, Impaired perceived functional ability, Increased fascial restricitons, Increased muscle spasms, Difficulty walking, Abnormal gait, Decreased balance, Decreased mobility, Impaired sensation  Visit Diagnosis: 1. Difficulty in walking, not elsewhere classified   2. History of falling        Problem List There are no active problems to display for this patient.  Lieutenant Diego PT, DPT 6:25 PM,10/21/18 [phone]  [facility] [address] [place], [address] Phone: [phone]   Fax:  [phone]  Name: Michelle Ruiz MRN: [id number] Date of Birth: 04/02/1970

## 2018-10-24 ENCOUNTER — Ambulatory Visit: Payer: BC Managed Care – PPO

## 2018-10-24 ENCOUNTER — Other Ambulatory Visit: Payer: Self-pay

## 2018-10-24 DIAGNOSIS — Z9181 History of falling: Secondary | ICD-10-CM

## 2018-10-24 DIAGNOSIS — R262 Difficulty in walking, not elsewhere classified: Secondary | ICD-10-CM | POA: Diagnosis not present

## 2018-10-24 NOTE — Therapy (Signed)
[facility] [address] [place], [address] Phone: [phone]   Fax:  [phone]  Physical Therapy Treatment  Patient Details  Name: Michelle Ruiz MRN: [id number] Date of Birth: 01-05-1971 Referring Provider (PT): Darrin Luis MD   Encounter Date: 10/24/2018  PT End of Session - 10/24/18 1744    Visit Number  41    Number of Visits  44    Date for PT Re-Evaluation  11/18/18    Authorization Type  BCBS    PT Start Time  1700    PT Stop Time  1742    PT Time Calculation (min)  42 min    Activity Tolerance  Patient tolerated treatment well    Behavior During Therapy  [facility] for tasks assessed/performed       Past Medical History:  Diagnosis Date   Abnormal Pap smear of cervix    Anxiety    Kidney stones    MS (multiple sclerosis) ([place])    UTI (urinary tract infection)     Past Surgical History:  Procedure Laterality Date   BREAST BIOPSY Right 2010   stereotactic biopsy/ neg/ dr.byrnett   BREAST BIOPSY Left 2012   neg/dr. brynett   CESAREAN SECTION  2008   [facility]   TONSILLECTOMY  2006    There were no vitals filed for this visit.  Subjective Assessment - 10/24/18 1718    Subjective  Pt reports feeling great after session with moderate muscle fatigue. Pt reports being close to 100% the following day of therapy. Pt does not report doing any HEP.    Patient is accompained by:  Family member    Pertinent History  Patient reports history of falls x3 in the past 6 months secondary to drop foot on the L LE. PMH: Multiple sclerosis; elbow fracture of the R UE    Limitations  Standing;Walking    How long can you stand comfortably?  20 min    How long can you walk comfortably?  1 miles    Patient Stated Goals  To be more balanced and core strengthening     Currently in Pain?  No/denies    Pain Score  0-No pain    Pain Onset  More than a month ago              TREATMENT   Therapeutic  Exercise to improve strength and activity tolerance. Side to side leg swings - x 58min B Forward leg swings - x 76min B Standing squats with UE support - x 25  Dorsiflexion in standing with 5# weight - x 35 Penguin walks (DF) 20 feet x6 Lateral weight shifts in B DF x25 Walking lunges - 48ft x 4 Bounding on agility ladder - x 4 down and back    Neuromuscular Re-education to improve strength and facilitate effective postural strategies to reduce fall risk. Single leg stance EC - 4-6ssec x 10 on R x10 on L Ball toss with feet in semitandem - x 12 each leg forward Tandem walking - 61ft x3 with alternating vertical head turns   Performed exercises to address improvement in balance and LE strength most notably with performing walking.                   PT Education - 10/24/18 1802    Education Details  Educated on technique/form.    Person(s) Educated  Patient    Methods  Explanation;Demonstration;Verbal cues  Comprehension  Verbalized understanding;Returned demonstration          PT Long Term Goals - 10/21/18 1735      PT LONG TERM GOAL #1   Title  Patient will be independent with balance HEP to continue benefits of therapy after discharge.    Baseline  dependent with form/technique for balance exercise; 03/25/2018: Independent     Time  6    Period  Weeks    Status  Achieved    Target Date  03/20/18      PT LONG TERM GOAL #2   Title  Patient will improve FGA by 4 points to indicate significant improvement with balancing while ambulating and decrease of fall risk    Baseline  03/25/2018: 28/30    Time  6    Period  Weeks    Status  Achieved    Target Date  03/20/18      PT LONG TERM GOAL #3   Title  Patient will be able to perform prolonged single leg stance >10sec with eyes closed to indicate improvement with static balance and decrease fall risk    Baseline  2 sec in standing EC; 03/25/2018: 3 sec; 04/29/2018: 4 sec; 08/21/2018: 5 sec; 09/19/2018: 6 sec ;  10/21/2018 3 sec    Time  4    Period  Weeks    Status  On-going    Target Date  11/18/18      PT LONG TERM GOAL #4   Title  Pt will improve 6 MWT >2070ft to demonstrate improved cardiorespiratory fitness    Baseline  Deferred to next session: 04/29/2018; 05/01/2018: 1412ft; 08/21/2018: 1479ft; 09/19/2018: 1450ft; 10/21/2018 1621    Time  4    Period  Weeks    Status  On-going    Target Date  11/18/18            Plan - 10/24/18 1757    Clinical Impression Statement  Patient continues to demonstrate progress towards her functional goals. The patient still exhibits fatigue with prolonged activities and difficulty with dynamic balance exercises, indicating continued impaired dynamic postural stability. Pt did not report exacerbation of sx following the session, citing only moderate muscle fatigue. The patient would benefit from continued skilled PT to maximize pt mobility, safety, and maintenance against natural progression of disease.    Personal Factors and Comorbidities  Past/Current Experience    Examination-Activity Limitations  Locomotion Level    Examination-Participation Restrictions  Community Activity    Stability/Clinical Decision Making  Stable/Uncomplicated    Rehab Potential  Good    Clinical Impairments Affecting Rehab Potential  (-) hx of MS; (+) good motivation    PT Frequency  2x / week    PT Duration  4 weeks    PT Treatment/Interventions  Moist Heat;Iontophoresis 4mg /ml Dexamethasone;Electrical Stimulation;Aquatic Therapy;Cryotherapy;Therapeutic activities;Neuromuscular re-education;Therapeutic exercise;Patient/family education;Dry needling;Manual techniques;Passive range of motion;Balance training;Functional mobility training;Gait training;Stair training;Joint Manipulations    PT Next Visit Plan  Continue addressing static (eyes closed), dynamic balance exercises, plyometric power movements, as well as incorporating more dual tasking and coordination activities.    PT Home  Exercise Plan  See education    Consulted and Agree with Plan of Care  Patient       Patient will benefit from skilled therapeutic intervention in order to improve the following deficits and impairments:  Pain, Decreased coordination, Impaired perceived functional ability, Increased fascial restricitons, Increased muscle spasms, Difficulty walking, Abnormal gait, Decreased balance, Decreased mobility, Impaired sensation  Visit Diagnosis:  1. Difficulty in walking, not elsewhere classified   2. History of falling        Problem List There are no active problems to display for this patient.   Scarlette Calico, SPT 10/24/2018, 6:02 PM  [facility] [address] [place], [address] Phone: [phone]   Fax:  [phone]  Name: Michelle Ruiz MRN: [id number] Date of Birth: Mar 03, 1971

## 2018-10-28 ENCOUNTER — Ambulatory Visit: Payer: BC Managed Care – PPO | Attending: Neurology

## 2018-10-28 ENCOUNTER — Other Ambulatory Visit: Payer: Self-pay

## 2018-10-28 DIAGNOSIS — R262 Difficulty in walking, not elsewhere classified: Secondary | ICD-10-CM | POA: Diagnosis not present

## 2018-10-28 DIAGNOSIS — M6281 Muscle weakness (generalized): Secondary | ICD-10-CM | POA: Diagnosis not present

## 2018-10-28 DIAGNOSIS — Z9181 History of falling: Secondary | ICD-10-CM | POA: Diagnosis not present

## 2018-10-28 NOTE — Therapy (Signed)
[facility] [address], [place], [address] Phone: [phone]   Fax:  [phone]  Physical Therapy Treatment  Patient Details  Name: Michelle Ruiz MRN: [id number] Date of Birth: April 28, 1970 Referring Provider (PT): Darrin Luis MD   Encounter Date: 10/28/2018  PT End of Session - 10/28/18 1814    Visit Number  42    Number of Visits  44    Date for PT Re-Evaluation  11/18/18    Authorization Type  BCBS    PT Start Time  1730    PT Stop Time  7342    PT Time Calculation (min)  40 min    Activity Tolerance  Patient tolerated treatment well    Behavior During Therapy  [facility] for tasks assessed/performed       Past Medical History:  Diagnosis Date   Abnormal Pap smear of cervix    Anxiety    Kidney stones    MS (multiple sclerosis) ([place])    UTI (urinary tract infection)     Past Surgical History:  Procedure Laterality Date   BREAST BIOPSY Right 2010   stereotactic biopsy/ neg/ dr.byrnett   BREAST BIOPSY Left 2012   neg/dr. brynett   CESAREAN SECTION  2008   [facility]   TONSILLECTOMY  2006    There were no vitals filed for this visit.  Subjective Assessment - 10/28/18 1738    Subjective  Pt reports feeling sore following last session and moderate muscle tightness the day after. Pt states no major changes since last session. Pt currently reports mid-thoracic trigger point acutely today.    Patient is accompained by:  Family member    Pertinent History  Patient reports history of falls x3 in the past 6 months secondary to drop foot on the L LE. PMH: Multiple sclerosis; elbow fracture of the R UE    Limitations  Standing;Walking    How long can you stand comfortably?  20 min    How long can you walk comfortably?  1 miles    Patient Stated Goals  To be more balanced and core strengthening     Currently in Pain?  No/denies    Pain Score  0-No pain    Pain Onset  More than a month ago        TREATMENT     Therapeutic Exercise to improve strength and activity tolerance.  Seated SNAG extensions Seated scapular retractions Seated cervical rotations   Performed exercises to reduce acute onset of pain and muscle spasms.   Side to side leg swings - x 62min B Forward leg swings - x 58min B Goblet squats (3#)- x 25  Dorsiflexion in standing with 5# weight - x 35 Lateral weight shifts in B DF x25 Explosive DL jumps 2x5     Neuromuscular Re-education to improve strength and facilitate effective postural strategies to reduce fall risk. Single leg stance EC - 4-6ssec x 10 on R x10 on L SL stance EO on Foam pad - ~20+s on R x10, 5-10s on L x10 Tandem Stance on Foam pad- x 12 each leg forward 10s each   Performed exercises to address improvement in balance and LE strength most notably with performing walking.       PT Education - 10/28/18 1812    Education Details  Pt educated on technique/form. HEP emphasized and modified to include cervical and scapular retractions, and self-SNAG extensions to manage thoracic pain.    Person(s)  Educated  Patient    Methods  Explanation;Demonstration;Verbal cues    Comprehension  Verbalized understanding;Returned demonstration          PT Long Term Goals - 10/21/18 1735      PT LONG TERM GOAL #1   Title  Patient will be independent with balance HEP to continue benefits of therapy after discharge.    Baseline  dependent with form/technique for balance exercise; 03/25/2018: Independent     Time  6    Period  Weeks    Status  Achieved    Target Date  03/20/18      PT LONG TERM GOAL #2   Title  Patient will improve FGA by 4 points to indicate significant improvement with balancing while ambulating and decrease of fall risk    Baseline  03/25/2018: 28/30    Time  6    Period  Weeks    Status  Achieved    Target Date  03/20/18      PT LONG TERM GOAL #3   Title  Patient will be able to perform prolonged single leg stance  >10sec with eyes closed to indicate improvement with static balance and decrease fall risk    Baseline  2 sec in standing EC; 03/25/2018: 3 sec; 04/29/2018: 4 sec; 08/21/2018: 5 sec; 09/19/2018: 6 sec ; 10/21/2018 3 sec    Time  4    Period  Weeks    Status  On-going    Target Date  11/18/18      PT LONG TERM GOAL #4   Title  Pt will improve 6 MWT >2096ft to demonstrate improved cardiorespiratory fitness    Baseline  Deferred to next session: 04/29/2018; 05/01/2018: 1433ft; 08/21/2018: 1468ft; 09/19/2018: 14102ft; 10/21/2018 1621    Time  4    Period  Weeks    Status  On-going    Target Date  11/18/18            Plan - 10/28/18 1814    Clinical Impression Statement  Pt continues to demonstrate improved tolerance to program, in terms of decreased exacerbation of sx during and following exercise. Pt able to perform exercises with good technique, demonstrating carryover from previous session. Pt able to tolerate increased loads with goblet squat and explosive DL squat jumps without sx increase. Pt continues to demonstrate improved static postural stability with and without foam pad (EO and EC) in SLS and tandem stance. Pt demonstrated improved pain response with cervical and scapular exercises, as well as technique modification with squats. Pt continues to demonstrate impaired activity tolerance, strength, and static/dynamic postural stability. Pt will continue to benefit from skilled therapy treatment in order to improve her aforementioned impairments, which will allow her to return to previous level of function.    Personal Factors and Comorbidities  Past/Current Experience    Examination-Activity Limitations  Locomotion Level    Examination-Participation Restrictions  Community Activity    Stability/Clinical Decision Making  Stable/Uncomplicated    Rehab Potential  Good    Clinical Impairments Affecting Rehab Potential  (-) hx of MS; (+) good motivation    PT Frequency  2x / week    PT Duration  4  weeks    PT Treatment/Interventions  Moist Heat;Iontophoresis 4mg /ml Dexamethasone;Electrical Stimulation;Aquatic Therapy;Cryotherapy;Therapeutic activities;Neuromuscular re-education;Therapeutic exercise;Patient/family education;Dry needling;Manual techniques;Passive range of motion;Balance training;Functional mobility training;Gait training;Stair training;Joint Manipulations    PT Next Visit Plan  Continue addressing static (eyes closed), dynamic balance exercises, plyometric power movements, as well as incorporating more dual  tasking and coordination activities.    PT Home Exercise Plan  See education    Consulted and Agree with Plan of Care  Patient       Patient will benefit from skilled therapeutic intervention in order to improve the following deficits and impairments:  Pain, Decreased coordination, Impaired perceived functional ability, Increased fascial restricitons, Increased muscle spasms, Difficulty walking, Abnormal gait, Decreased balance, Decreased mobility, Impaired sensation  Visit Diagnosis: 1. Difficulty in walking, not elsewhere classified   2. History of falling        Problem List There are no active problems to display for this patient.   Scarlette Calico, SPT 10/28/2018, 6:19 PM  [facility] [address], [place], [address] Phone: [phone]   Fax:  [phone]  Name: Michelle Ruiz MRN: [id number] Date of Birth: 07/28/1970

## 2018-10-29 ENCOUNTER — Other Ambulatory Visit: Payer: Self-pay | Admitting: Neurology

## 2018-10-29 ENCOUNTER — Other Ambulatory Visit (HOSPITAL_COMMUNITY): Payer: Self-pay | Admitting: Neurology

## 2018-10-29 DIAGNOSIS — G35 Multiple sclerosis: Secondary | ICD-10-CM | POA: Diagnosis not present

## 2018-10-29 DIAGNOSIS — E559 Vitamin D deficiency, unspecified: Secondary | ICD-10-CM | POA: Diagnosis not present

## 2018-10-29 DIAGNOSIS — E538 Deficiency of other specified B group vitamins: Secondary | ICD-10-CM | POA: Diagnosis not present

## 2018-10-29 DIAGNOSIS — R5383 Other fatigue: Secondary | ICD-10-CM | POA: Diagnosis not present

## 2018-10-29 DIAGNOSIS — R2689 Other abnormalities of gait and mobility: Secondary | ICD-10-CM | POA: Diagnosis not present

## 2018-10-30 ENCOUNTER — Other Ambulatory Visit: Payer: Self-pay

## 2018-10-30 ENCOUNTER — Ambulatory Visit: Payer: BC Managed Care – PPO

## 2018-10-30 DIAGNOSIS — Z9181 History of falling: Secondary | ICD-10-CM

## 2018-10-30 DIAGNOSIS — M6281 Muscle weakness (generalized): Secondary | ICD-10-CM | POA: Diagnosis not present

## 2018-10-30 DIAGNOSIS — R262 Difficulty in walking, not elsewhere classified: Secondary | ICD-10-CM | POA: Diagnosis not present

## 2018-10-30 NOTE — Therapy (Signed)
[facility] [address], [place], [address] Phone: [phone]   Fax:  [phone]  Physical Therapy Treatment  Patient Details  Name: Michelle Ruiz MRN: [id number] Date of Birth: [birth year] Referring Provider (PT): Darrin Luis MD   Encounter Date: 10/30/2018  PT End of Session - 10/30/18 1830    Visit Number  43    Number of Visits  44    Date for PT Re-Evaluation  11/18/18    Authorization Type  BCBS    PT Start Time  1745    PT Stop Time  4765    PT Time Calculation (min)  45 min    Activity Tolerance  Patient tolerated treatment well    Behavior During Therapy  [facility] for tasks assessed/performed       Past Medical History:  Diagnosis Date   Abnormal Pap smear of cervix    Anxiety    Kidney stones    MS (multiple sclerosis) ([place])    UTI (urinary tract infection)     Past Surgical History:  Procedure Laterality Date   BREAST BIOPSY Right 2010   stereotactic biopsy/ neg/ dr.byrnett   BREAST BIOPSY Left 2012   neg/dr. brynett   CESAREAN SECTION  2008   [facility]   TONSILLECTOMY  2006    There were no vitals filed for this visit.  Subjective Assessment - 10/30/18 1748    Subjective  Pt reports feeling better following last session but having some muscle soreness.    Patient is accompained by:  Family member    Pertinent History  Patient reports history of falls x3 in the past 6 months secondary to drop foot on the L LE. PMH: Multiple sclerosis; elbow fracture of the R UE    Limitations  Standing;Walking    How long can you stand comfortably?  20 min    How long can you walk comfortably?  1 miles    Patient Stated Goals  To be more balanced and core strengthening     Currently in Pain?  No/denies    Pain Score  0-No pain    Pain Onset  More than a month ago       Therapeutic Exercise to improve strength and activity tolerance.     Performed exercises to reduce acute onset of pain  and muscle spasms.    Side to side leg swings - x 94min B Forward leg swings - x 76min B Goblet squats (6#)- x 25  Dorsiflexion in standing with 5# weight - x 35 Explosive SL jumps 2x5 each  Walking Lunges 20 feet x4       Neuromuscular Re-education to improve strength and facilitate effective postural strategies to reduce fall risk.  Tandem Walking with arms extended and alternating horizontal head turns 20 feet x4 Lateral braiding 20 feet x4 Lateral braiding with ball catch and toss 20 feet x4 Tandem Walking with ball catch and toss 20 feet x4 Tandem Stance on Foam pad 2 min each with ball catch and toss     Performed exercises to address improvement in balance and LE strength most notably with performing walking.         PT Education - 10/30/18 1811    Education Details  Pt educated on technique/form. Continue HEP as is.    Person(s) Educated  Patient    Methods  Explanation;Demonstration;Verbal cues    Comprehension  Verbalized understanding;Returned demonstration  PT Long Term Goals - 10/21/18 1735      PT LONG TERM GOAL #1   Title  Patient will be independent with balance HEP to continue benefits of therapy after discharge.    Baseline  dependent with form/technique for balance exercise; 03/25/2018: Independent     Time  6    Period  Weeks    Status  Achieved    Target Date  03/20/18      PT LONG TERM GOAL #2   Title  Patient will improve FGA by 4 points to indicate significant improvement with balancing while ambulating and decrease of fall risk    Baseline  03/25/2018: 28/30    Time  6    Period  Weeks    Status  Achieved    Target Date  03/20/18      PT LONG TERM GOAL #3   Title  Patient will be able to perform prolonged single leg stance >10sec with eyes closed to indicate improvement with static balance and decrease fall risk    Baseline  2 sec in standing EC; 03/25/2018: 3 sec; 04/29/2018: 4 sec; 08/21/2018: 5 sec; 09/19/2018: 6 sec ; 10/21/2018  3 sec    Time  4    Period  Weeks    Status  On-going    Target Date  11/18/18      PT LONG TERM GOAL #4   Title  Pt will improve 6 MWT >2015ft to demonstrate improved cardiorespiratory fitness    Baseline  Deferred to next session: 04/29/2018; 05/01/2018: 1465ft; 08/21/2018: 1434ft; 09/19/2018: 145ft; 10/21/2018 1621    Time  4    Period  Weeks    Status  On-going    Target Date  11/18/18            Plan - 10/30/18 1831    Clinical Impression Statement  Pt continues to demonstrate improved activity tolerance with progressed exercises focused on strength training and static/dynamic balance (EO and EC) training. Pt able to tolerate increased weight with goblet squats (3# to 6#), as well as increased dual tasking loads with tandem walking and braiding. Pt did not demonstrate observed or verbalized exacerbation of sx. Pt verbalized that she has improved significantly with tandem walking and SLS. Although her progress, pt still demonstrates impaired strength and activity tolerance with higher levels of activity, as well as impaired static/dynamic postural stability (SL>DL, and EC>EO). Pt will continue to benefit from skilled therapy treatment in order to improve her overall fall risk and improve her quality of life.    Personal Factors and Comorbidities  Past/Current Experience    Examination-Activity Limitations  Locomotion Level    Examination-Participation Restrictions  Community Activity    Stability/Clinical Decision Making  Stable/Uncomplicated    Rehab Potential  Good    Clinical Impairments Affecting Rehab Potential  (-) hx of MS; (+) good motivation    PT Frequency  2x / week    PT Duration  4 weeks    PT Treatment/Interventions  Moist Heat;Iontophoresis 4mg /ml Dexamethasone;Electrical Stimulation;Aquatic Therapy;Cryotherapy;Therapeutic activities;Neuromuscular re-education;Therapeutic exercise;Patient/family education;Dry needling;Manual techniques;Passive range of motion;Balance  training;Functional mobility training;Gait training;Stair training;Joint Manipulations    PT Next Visit Plan  Continue addressing static (eyes closed), dynamic balance exercises, plyometric power movements, as well as incorporating more dual tasking and coordination activities.    PT Home Exercise Plan  See education    Consulted and Agree with Plan of Care  Patient       Patient will benefit from skilled  therapeutic intervention in order to improve the following deficits and impairments:  Pain, Decreased coordination, Impaired perceived functional ability, Increased fascial restricitons, Increased muscle spasms, Difficulty walking, Abnormal gait, Decreased balance, Decreased mobility, Impaired sensation  Visit Diagnosis: 1. Difficulty in walking, not elsewhere classified   2. History of falling        Problem List There are no active problems to display for this patient.   Michelle Ruiz, SPT 10/30/2018, 6:38 PM  [facility] [address] [place], [address] Phone: [phone]   Fax:  [phone]  Name: Michelle Ruiz MRN: [id number] Date of Birth: 10/25/1970

## 2018-11-04 ENCOUNTER — Other Ambulatory Visit: Payer: Self-pay

## 2018-11-04 ENCOUNTER — Ambulatory Visit: Payer: BC Managed Care – PPO

## 2018-11-04 DIAGNOSIS — Z9181 History of falling: Secondary | ICD-10-CM | POA: Diagnosis not present

## 2018-11-04 DIAGNOSIS — M6281 Muscle weakness (generalized): Secondary | ICD-10-CM | POA: Diagnosis not present

## 2018-11-04 DIAGNOSIS — R262 Difficulty in walking, not elsewhere classified: Secondary | ICD-10-CM

## 2018-11-05 NOTE — Therapy (Signed)
[facility] [address] [place], [address] Phone: [phone]   Fax:  [phone]  Physical Therapy Treatment  Patient Details  Name: Michelle Ruiz MRN: [id number] Date of Birth: 05/27/1970 Referring Provider (PT): Darrin Luis MD   Encounter Date: 11/04/2018  PT End of Session - 11/04/18 1807    Visit Number  44    Number of Visits  52    Date for PT Re-Evaluation  11/18/18    Authorization Type  BCBS    PT Start Time  1745    PT Stop Time  1830    PT Time Calculation (min)  45 min    Activity Tolerance  Patient tolerated treatment well    Behavior During Therapy  [facility] for tasks assessed/performed       Past Medical History:  Diagnosis Date  . Abnormal Pap smear of cervix   . Anxiety   . Kidney stones   . MS (multiple sclerosis) ([place])   . UTI (urinary tract infection)     Past Surgical History:  Procedure Laterality Date  . BREAST BIOPSY Right 2010   stereotactic biopsy/ neg/ dr.byrnett  . BREAST BIOPSY Left 2012   neg/dr. brynett  . CESAREAN SECTION  2008   [facility]  . TONSILLECTOMY  2006    There were no vitals filed for this visit.  Subjective Assessment - 11/04/18 1802    Subjective  Patient states she performed her exercises at home once. Patient reports overall she is improving.    Patient is accompained by:  Family member    Pertinent History  Patient reports history of falls x3 in the past 6 months secondary to drop foot on the L LE. PMH: Multiple sclerosis; elbow fracture of the R UE    Limitations  Standing;Walking    How long can you stand comfortably?  20 min    How long can you walk comfortably?  1 miles    Patient Stated Goals  To be more balanced and core strengthening     Currently in Pain?  No/denies    Pain Onset  More than a month ago         TREATMENT Therapeutic Exercise Hip abduction with GTB off of airex pad - x 20 B Hip hips in standing off of 8" step - x  20 Leg swings forward/ side to step - 1 min each side and in each direction Step ups onto 8" with high knee-x 10 B Forward monster walks in standing - 2 x 61ft Tandem ball toss on airex pad - 2 x 5  Bounding mini hops on agility ladder - 6 x 55ft  Performed exercises to address balance difficulties and to improve LE functional strength    PT Education - 11/04/18 1807    Education Details  form/technique with exercise    Person(s) Educated  Patient    Methods  Explanation;Demonstration    Comprehension  Verbalized understanding;Returned demonstration          PT Long Term Goals - 10/21/18 1735      PT LONG TERM GOAL #1   Title  Patient will be independent with balance HEP to continue benefits of therapy after discharge.    Baseline  dependent with form/technique for balance exercise; 03/25/2018: Independent     Time  6    Period  Weeks    Status  Achieved    Target Date  03/20/18  PT LONG TERM GOAL #2   Title  Patient will improve FGA by 4 points to indicate significant improvement with balancing while ambulating and decrease of fall risk    Baseline  03/25/2018: 28/30    Time  6    Period  Weeks    Status  Achieved    Target Date  03/20/18      PT LONG TERM GOAL #3   Title  Patient will be able to perform prolonged single leg stance >10sec with eyes closed to indicate improvement with static balance and decrease fall risk    Baseline  2 sec in standing EC; 03/25/2018: 3 sec; 04/29/2018: 4 sec; 08/21/2018: 5 sec; 09/19/2018: 6 sec ; 10/21/2018 3 sec    Time  4    Period  Weeks    Status  On-going    Target Date  11/18/18      PT LONG TERM GOAL #4   Title  Pt will improve 6 MWT >2070ft to demonstrate improved cardiorespiratory fitness    Baseline  Deferred to next session: 04/29/2018; 05/01/2018: 1436ft; 08/21/2018: 1426ft; 09/19/2018: 1424ft; 10/21/2018 1621    Time  4    Period  Weeks    Status  On-going    Target Date  11/18/18            Plan - 11/04/18 1811     Clinical Impression Statement  Focused on improving hip strength and improving dynamic/static balance as patient continues to present with increased fall risk most notably with walking for prolonged periods of time. Patient demonstrates difficulty with single leg stance further indicating static balance limitations and patient will benefit from further skilled therapy to return to prior level of function    Personal Factors and Comorbidities  Past/Current Experience    Examination-Activity Limitations  Locomotion Level    Examination-Participation Restrictions  Community Activity    Stability/Clinical Decision Making  Stable/Uncomplicated    Rehab Potential  Good    Clinical Impairments Affecting Rehab Potential  (-) hx of MS; (+) good motivation    PT Frequency  2x / week    PT Duration  4 weeks    PT Treatment/Interventions  Moist Heat;Iontophoresis 4mg /ml Dexamethasone;Electrical Stimulation;Aquatic Therapy;Cryotherapy;Therapeutic activities;Neuromuscular re-education;Therapeutic exercise;Patient/family education;Dry needling;Manual techniques;Passive range of motion;Balance training;Functional mobility training;Gait training;Stair training;Joint Manipulations    PT Next Visit Plan  Continue addressing static (eyes closed), dynamic balance exercises, plyometric power movements, as well as incorporating more dual tasking and coordination activities.    PT Home Exercise Plan  See education    Consulted and Agree with Plan of Care  Patient       Patient will benefit from skilled therapeutic intervention in order to improve the following deficits and impairments:  Pain, Decreased coordination, Impaired perceived functional ability, Increased fascial restricitons, Increased muscle spasms, Difficulty walking, Abnormal gait, Decreased balance, Decreased mobility, Impaired sensation  Visit Diagnosis: 1. Difficulty in walking, not elsewhere classified   2. Muscle weakness (generalized)   3. History  of falling        Problem List There are no active problems to display for this patient.   [name], PT DPT 11/05/2018, 8:25 AM  [facility] [address] [place], [address] Phone: [phone]   Fax:  [phone]  Name: Michelle Ruiz MRN: [id number] Date of Birth: [birth year]

## 2018-11-06 ENCOUNTER — Ambulatory Visit: Payer: BC Managed Care – PPO

## 2018-11-06 ENCOUNTER — Other Ambulatory Visit: Payer: Self-pay

## 2018-11-06 DIAGNOSIS — R262 Difficulty in walking, not elsewhere classified: Secondary | ICD-10-CM | POA: Diagnosis not present

## 2018-11-06 DIAGNOSIS — Z9181 History of falling: Secondary | ICD-10-CM

## 2018-11-06 DIAGNOSIS — M6281 Muscle weakness (generalized): Secondary | ICD-10-CM

## 2018-11-06 NOTE — Therapy (Signed)
[place] [facility] [address], [place], [address] Phone: [phone]   Fax:  [phone]  Physical Therapy Treatment  Patient Details  Name: Michelle Ruiz MRN: [id number] Date of Birth: [birth year] Referring Provider (PT): Darrin Luis MD   Encounter Date: 11/06/2018  PT End of Session - 11/06/18 1809    Visit Number  45    Number of Visits  52    Date for PT Re-Evaluation  11/18/18    Authorization Type  BCBS    PT Start Time  1745    PT Stop Time  5188    PT Time Calculation (min)  45 min    Activity Tolerance  Patient tolerated treatment well    Behavior During Therapy  [facility] for tasks assessed/performed       Past Medical History:  Diagnosis Date   Abnormal Pap smear of cervix    Anxiety    Kidney stones    MS (multiple sclerosis) ([place])    UTI (urinary tract infection)     Past Surgical History:  Procedure Laterality Date   BREAST BIOPSY Right 2010   stereotactic biopsy/ neg/ dr.byrnett   BREAST BIOPSY Left 2012   neg/dr. brynett   CESAREAN SECTION  2008   [facility]   TONSILLECTOMY  2006    There were no vitals filed for this visit.  Subjective Assessment - 11/06/18 1745    Subjective  Pt states that she felt better and without sx exacerbation. Pt reports no major changes since last session.    Patient is accompained by:  Family member    Pertinent History  Patient reports history of falls x3 in the past 6 months secondary to drop foot on the L LE. PMH: Multiple sclerosis; elbow fracture of the R UE    Limitations  Standing;Walking    How long can you stand comfortably?  20 min    How long can you walk comfortably?  1 miles    Patient Stated Goals  To be more balanced and core strengthening     Currently in Pain?  No/denies    Pain Score  0-No pain    Pain Onset  More than a month ago         Therapeutic Exercise to improve strength and activity tolerance.     Performed exercises  to reduce acute onset of pain and muscle spasms.    Side to side leg swings - x 20min B Forward leg swings - x 72min B Goblet squats (6#)- x 25  Hip abduction with GTB off of airex pad - x 20 B Bounding mini hops on agility ladder - 6 x 88ft Skipping on agility ladder - 4 x 39ft Step ups onto 8 with high knee-- 2 x 10 B       Neuromuscular Re-education to improve strength and facilitate effective postural strategies to reduce fall risk.   Tandem Walking with alternating horizontal head turns 20 feet x4 Lateral braiding 20 feet x2 with alternating horizontal head turns Lateral braiding with ball catch and football toss 20 feet x4 Tandem Stance on Foam pad 2 min each with ball catch and toss  Pt performed all exercises without sx exacerbation or sx increase.     PT Education - 11/06/18 1807    Education Details  Pt educated on technique/form. Continue HEP as is.    Person(s) Educated  Patient    Methods  Explanation;Demonstration;Verbal cues    Comprehension  Verbalized understanding;Returned demonstration;Verbal cues required          PT Long Term Goals - 10/21/18 1735      PT LONG TERM GOAL #1   Title  Patient will be independent with balance HEP to continue benefits of therapy after discharge.    Baseline  dependent with form/technique for balance exercise; 03/25/2018: Independent     Time  6    Period  Weeks    Status  Achieved    Target Date  03/20/18      PT LONG TERM GOAL #2   Title  Patient will improve FGA by 4 points to indicate significant improvement with balancing while ambulating and decrease of fall risk    Baseline  03/25/2018: 28/30    Time  6    Period  Weeks    Status  Achieved    Target Date  03/20/18      PT LONG TERM GOAL #3   Title  Patient will be able to perform prolonged single leg stance >10sec with eyes closed to indicate improvement with static balance and decrease fall risk    Baseline  2 sec in standing EC; 03/25/2018: 3 sec;  04/29/2018: 4 sec; 08/21/2018: 5 sec; 09/19/2018: 6 sec ; 10/21/2018 3 sec    Time  4    Period  Weeks    Status  On-going    Target Date  11/18/18      PT LONG TERM GOAL #4   Title  Pt will improve 6 MWT >2031ft to demonstrate improved cardiorespiratory fitness    Baseline  Deferred to next session: 04/29/2018; 05/01/2018: 1422ft; 08/21/2018: 1432ft; 09/19/2018: 1418ft; 10/21/2018 1621    Time  4    Period  Weeks    Status  On-going    Target Date  11/18/18            Plan - 11/06/18 1958    Clinical Impression Statement  Pt continues to demonstrate improved capacity with exercises focused on hip strength and dynamic/static balance as patient. Pts improved activity tolerance with higher levels of dynamic activity, as indicated by her decreased rest breaks and more consistent technique/form throughout the session (before onset of muscular fatigue). Patient further demonstrates difficulty with single leg stance, still indicating static/dynamic postural stability limitations and patient will benefit from further skilled therapy to return to prior level of function.    Personal Factors and Comorbidities  Past/Current Experience    Examination-Activity Limitations  Locomotion Level    Examination-Participation Restrictions  Community Activity    Stability/Clinical Decision Making  Stable/Uncomplicated    Rehab Potential  Good    Clinical Impairments Affecting Rehab Potential  (-) hx of MS; (+) good motivation    PT Frequency  2x / week    PT Duration  4 weeks    PT Treatment/Interventions  Moist Heat;Iontophoresis 4mg /ml Dexamethasone;Electrical Stimulation;Aquatic Therapy;Cryotherapy;Therapeutic activities;Neuromuscular re-education;Therapeutic exercise;Patient/family education;Dry needling;Manual techniques;Passive range of motion;Balance training;Functional mobility training;Gait training;Stair training;Joint Manipulations    PT Next Visit Plan  Continue addressing static (eyes closed), dynamic  balance exercises, plyometric power movements, as well as incorporating more dual tasking and coordination activities.    PT Home Exercise Plan  See education    Consulted and Agree with Plan of Care  Patient       Patient will benefit from skilled therapeutic intervention in order to improve the following deficits and impairments:  Pain, Decreased coordination, Impaired perceived functional ability, Increased fascial restricitons, Increased muscle spasms, Difficulty walking,  Abnormal gait, Decreased balance, Decreased mobility, Impaired sensation  Visit Diagnosis: No diagnosis found.     Problem List There are no active problems to display for this patient.   Scarlette Calico, SPT 11/06/2018, 7:59 PM  [place] [facility] [address], [place], [address] Phone: [phone]   Fax:  [phone]  Name: Michelle Ruiz MRN: [id number] Date of Birth: 1971-03-09

## 2018-11-09 ENCOUNTER — Other Ambulatory Visit: Payer: Self-pay

## 2018-11-09 ENCOUNTER — Ambulatory Visit
Admission: RE | Admit: 2018-11-09 | Discharge: 2018-11-09 | Disposition: A | Discharge status: Home / Self Care | Payer: BC Managed Care – PPO | Source: Ambulatory Visit | Attending: Neurology | Admitting: Neurology

## 2018-11-09 DIAGNOSIS — M47812 Spondylosis without myelopathy or radiculopathy, cervical region: Secondary | ICD-10-CM | POA: Diagnosis not present

## 2018-11-09 DIAGNOSIS — G35 Multiple sclerosis: Secondary | ICD-10-CM | POA: Insufficient documentation

## 2018-11-09 DIAGNOSIS — M5021 Other cervical disc displacement,  high cervical region: Secondary | ICD-10-CM | POA: Diagnosis not present

## 2018-11-09 DIAGNOSIS — M50223 Other cervical disc displacement at C6-C7 level: Secondary | ICD-10-CM | POA: Diagnosis not present

## 2018-11-09 DIAGNOSIS — M50221 Other cervical disc displacement at C4-C5 level: Secondary | ICD-10-CM | POA: Diagnosis not present

## 2018-11-09 MED ORDER — GADOBUTROL 1 MMOL/ML IV SOLN
7.0000 mL | Freq: Once | INTRAVENOUS | Status: AC | PRN
  Administered 2018-11-09: 7 mL via INTRAVENOUS

## 2018-11-11 ENCOUNTER — Ambulatory Visit: Payer: BC Managed Care – PPO

## 2018-11-13 ENCOUNTER — Ambulatory Visit: Payer: BC Managed Care – PPO

## 2018-11-13 ENCOUNTER — Other Ambulatory Visit: Payer: Self-pay

## 2018-11-13 DIAGNOSIS — M6281 Muscle weakness (generalized): Secondary | ICD-10-CM

## 2018-11-13 DIAGNOSIS — R262 Difficulty in walking, not elsewhere classified: Secondary | ICD-10-CM

## 2018-11-13 DIAGNOSIS — Z9181 History of falling: Secondary | ICD-10-CM | POA: Diagnosis not present

## 2018-11-13 NOTE — Therapy (Signed)
[place] [facility] [address] [place], [address] Phone: [phone]   Fax:  [phone]  Physical Therapy Treatment  Patient Details  Name: Michelle Ruiz MRN: [id number] Date of Birth: [birth year] Referring Provider (PT): Darrin Luis MD   Encounter Date: 11/13/2018  PT End of Session - 11/13/18 1930    Visit Number  46    Number of Visits  52    Date for PT Re-Evaluation  11/18/18    Authorization Type  BCBS    PT Start Time  1745    PT Stop Time  2694    PT Time Calculation (min)  45 min    Activity Tolerance  Patient tolerated treatment well    Behavior During Therapy  [facility] for tasks assessed/performed       Past Medical History:  Diagnosis Date   Abnormal Pap smear of cervix    Anxiety    Kidney stones    MS (multiple sclerosis) ([place])    UTI (urinary tract infection)     Past Surgical History:  Procedure Laterality Date   BREAST BIOPSY Right 2010   stereotactic biopsy/ neg/ dr.byrnett   BREAST BIOPSY Left 2012   neg/dr. brynett   CESAREAN SECTION  2008   [facility]   TONSILLECTOMY  2006    There were no vitals filed for this visit.  Subjective Assessment - 11/13/18 1929    Subjective  Pt reports current 0/10 pain and feeling great after last session. States no major changes since last session.    Patient is accompained by:  Family member    Pertinent History  Patient reports history of falls x3 in the past 6 months secondary to drop foot on the L LE. PMH: Multiple sclerosis; elbow fracture of the R UE    Limitations  Standing;Walking    How long can you stand comfortably?  20 min    How long can you walk comfortably?  1 miles    Patient Stated Goals  To be more balanced and core strengthening     Currently in Pain?  No/denies    Pain Score  0-No pain    Pain Onset  More than a month ago       Therapeutic Exercise to improve strength and activity tolerance.     Performed exercises  to reduce acute onset of pain and muscle spasms.    Side to side leg swings - x 72min B Forward leg swings - x 58min B Air squats- x 25  Hip abduction with GTB off airex pad - x 20 B Bounding mini hops on agility ladder - 4 x 22ft High jog on agility ladder - 4 x 94ft Step ups onto 8 with high knee-- 2 x 10 B      Neuromuscular Re-education to improve strength and facilitate effective postural strategies to fall risk.   Tandem Walking with alternating horizontal head turns 20 feet x3 SLS eyes, eyes closed x 1 min each leg, each condition Tandem Stance on Foam pad with ball catch and toss against rebounder x10 reps each   Pt performed all exercises without sx exacerbation or sx increase.                         PT Education - 11/13/18 1930    Education Details  Pt educated on technique/form. HEP to continue as is.    Person(s) Educated  Patient  Methods  Explanation;Demonstration    Comprehension  Verbalized understanding;Returned demonstration          PT Long Term Goals - 10/21/18 1735      PT LONG TERM GOAL #1   Title  Patient will be independent with balance HEP to continue benefits of therapy after discharge.    Baseline  dependent with form/technique for balance exercise; 03/25/2018: Independent     Time  6    Period  Weeks    Status  Achieved    Target Date  03/20/18      PT LONG TERM GOAL #2   Title  Patient will improve FGA by 4 points to indicate significant improvement with balancing while ambulating and decrease of fall risk    Baseline  03/25/2018: 28/30    Time  6    Period  Weeks    Status  Achieved    Target Date  03/20/18      PT LONG TERM GOAL #3   Title  Patient will be able to perform prolonged single leg stance >10sec with eyes closed to indicate improvement with static balance and decrease fall risk    Baseline  2 sec in standing EC; 03/25/2018: 3 sec; 04/29/2018: 4 sec; 08/21/2018: 5 sec; 09/19/2018: 6 sec ; 10/21/2018 3 sec     Time  4    Period  Weeks    Status  On-going    Target Date  11/18/18      PT LONG TERM GOAL #4   Title  Pt will improve 6 MWT >2012ft to demonstrate improved cardiorespiratory fitness    Baseline  Deferred to next session: 04/29/2018; 05/01/2018: 1447ft; 08/21/2018: 1479ft; 09/19/2018: 1473ft; 10/21/2018 1621    Time  4    Period  Weeks    Status  On-going    Target Date  11/18/18            Plan - 11/13/18 1933    Clinical Impression Statement  Pt demonstrated slightly increased difficulty with dynamic strengthening and dynamic postural stability exercises, citing increased muscular soreness and fatigue today. Pt able to tolerate all exercises with increased rest breaks in between sets without sx exacerbation. Pt demonstrated improved technique and power with plyometric exercises , indicating improved SL power and endurance. Pt had increased difficulty with standing squats with weight and dynamic balance exercises with horizontal head turns. Patient further demonstrates difficulty with single leg stance, still indicating static/dynamic postural stability limitations and patient will benefit from further skilled therapy to return to prior level of function.    Personal Factors and Comorbidities  Past/Current Experience    Examination-Activity Limitations  Locomotion Level    Examination-Participation Restrictions  Community Activity    Stability/Clinical Decision Making  Stable/Uncomplicated    Rehab Potential  Good    Clinical Impairments Affecting Rehab Potential  (-) hx of MS; (+) good motivation    PT Frequency  2x / week    PT Duration  4 weeks    PT Treatment/Interventions  Moist Heat;Iontophoresis 4mg /ml Dexamethasone;Electrical Stimulation;Aquatic Therapy;Cryotherapy;Therapeutic activities;Neuromuscular re-education;Therapeutic exercise;Patient/family education;Dry needling;Manual techniques;Passive range of motion;Balance training;Functional mobility training;Gait training;Stair  training;Joint Manipulations    PT Next Visit Plan  Continue addressing static (eyes closed), dynamic balance exercises, plyometric power movements, as well as incorporating more dual tasking and coordination activities.    PT Home Exercise Plan  See education    Consulted and Agree with Plan of Care  Patient       Patient will benefit  from skilled therapeutic intervention in order to improve the following deficits and impairments:  Pain, Decreased coordination, Impaired perceived functional ability, Increased fascial restricitons, Increased muscle spasms, Difficulty walking, Abnormal gait, Decreased balance, Decreased mobility, Impaired sensation  Visit Diagnosis: 1. Muscle weakness (generalized)   2. Difficulty in walking, not elsewhere classified        Problem List There are no active problems to display for this patient.   Scarlette Calico, SPT 11/13/2018, 7:34 PM  [place] [facility] [address] [place], [address] Phone: [phone]   Fax:  [phone]  Name: DAVIONNA BLACKSHER MRN: [id number] Date of Birth: [birth year]

## 2018-11-18 ENCOUNTER — Other Ambulatory Visit: Payer: Self-pay

## 2018-11-18 ENCOUNTER — Ambulatory Visit: Payer: BC Managed Care – PPO

## 2018-11-18 DIAGNOSIS — R262 Difficulty in walking, not elsewhere classified: Secondary | ICD-10-CM

## 2018-11-18 DIAGNOSIS — M6281 Muscle weakness (generalized): Secondary | ICD-10-CM

## 2018-11-18 DIAGNOSIS — Z9181 History of falling: Secondary | ICD-10-CM

## 2018-11-18 NOTE — Therapy (Signed)
[place] [facility] [address] [place], [address] Phone: [phone]   Fax:  [phone]  Physical Therapy Treatment  Patient Details  Name: Michelle Ruiz MRN: [id number] Date of Birth: 1971/03/14 Referring Provider (PT): Darrin Luis MD   Encounter Date: 11/18/2018  PT End of Session - 11/18/18 1910    Visit Number  47    Number of Visits  52    Date for PT Re-Evaluation  11/18/18    Authorization Type  BCBS    PT Start Time  1815    PT Stop Time  1900    PT Time Calculation (min)  45 min    Activity Tolerance  Patient tolerated treatment well    Behavior During Therapy  [facility] for tasks assessed/performed       Past Medical History:  Diagnosis Date   Abnormal Pap smear of cervix    Anxiety    Kidney stones    MS (multiple sclerosis) ([place])    UTI (urinary tract infection)     Past Surgical History:  Procedure Laterality Date   BREAST BIOPSY Right 2010   stereotactic biopsy/ neg/ dr.byrnett   BREAST BIOPSY Left 2012   neg/dr. brynett   [facility]AREAN SECTION  2008   [facility]   TONSILLECTOMY  2006    There were no vitals filed for this visit.  Subjective Assessment - 11/18/18 1828    Subjective  Pt reports a current 0/10 pain and only minor soreness after last session. Pt states no other major changes since last session.    Patient is accompained by:  Family member    Pertinent History  Patient reports history of falls x3 in the past 6 months secondary to drop foot on the L LE. PMH: Multiple sclerosis; elbow fracture of the R UE    Limitations  Standing;Walking    How long can you stand comfortably?  20 min    How long can you walk comfortably?  1 miles    Patient Stated Goals  To be more balanced and core strengthening     Currently in Pain?  No/denies    Pain Score  0-No pain    Pain Onset  More than a month ago         Therapeutic Exercise to address and improve her strength, activity  tolerance, and power with mobility.   Side to side leg swings - x 21min B Forward leg swings - x 80min B Goblet squats 7#- x 25  Hip abduction with GTB off airex pad - x 20 B 2 feet in each square, agility ladder - 2 x 20ft 1 foot in each square, agility ladder - 2 x 15 feet High knees on agility ladder - 2 x 64ft      Neuromuscular Re-education to improve strength and facilitate effective postural strategies to fall risk.   Tandem Walking with ball toss and catch 20 feet x4 SLS eyes open x 2 min on foam, each leg Tandem eyes closed x1 min on foam, each leg   Pt performed all exercises without sx exacerbation or sx increase.      PT Education - 11/18/18 1901    Education Details  Pt educated on technique/form. HEP to continue as is.    Person(s) Educated  Patient    Methods  Explanation;Demonstration    Comprehension  Verbalized understanding;Returned demonstration          PT Long Term Goals - 10/21/18  [place] #1   Title  Patient will be independent with balance HEP to continue benefits of therapy after discharge.    Baseline  dependent with form/technique for balance exercise; 03/25/2018: Independent     Time  6    Period  Weeks    Status  Achieved    Target Date  03/20/18      PT LONG TERM GOAL #2   Title  Patient will improve FGA by 4 points to indicate significant improvement with balancing while ambulating and decrease of fall risk    Baseline  03/25/2018: 28/30    Time  6    Period  Weeks    Status  Achieved    Target Date  03/20/18      PT LONG TERM GOAL #3   Title  Patient will be able to perform prolonged single leg stance >10sec with eyes closed to indicate improvement with static balance and decrease fall risk    Baseline  2 sec in standing EC; 03/25/2018: 3 sec; 04/29/2018: 4 sec; 08/21/2018: 5 sec; 09/19/2018: 6 sec ; 10/21/2018 3 sec    Time  4    Period  Weeks    Status  On-going    Target Date  11/18/18      PT LONG TERM GOAL  #4   Title  Pt will improve 6 MWT >2063ft to demonstrate improved cardiorespiratory fitness    Baseline  Deferred to next session: 04/29/2018; 05/01/2018: 1477ft; 08/21/2018: 1435ft; 09/19/2018: 1430ft; 10/21/2018 1621    Time  4    Period  Weeks    Status  On-going    Target Date  11/18/18            Plan - 11/18/18 1925    Clinical Impression Statement  Pt able to perform all exercises with improved tolerance and capacity, compared to previous session, as indicated by decreased rest breaks and observed/verbalized fatigue. Pt demonstrated improved technique and power with plyometric exercises, indicating improved SL/DL power and endurance. Patient further demonstrates difficulty with single leg stance, still indicating static/dynamic postural stability limitations and patient will benefit from further skilled therapy to return to prior level of function.    Personal Factors and Comorbidities  Past/Current Experience    Examination-Activity Limitations  Locomotion Level    Examination-Participation Restrictions  Community Activity    Stability/Clinical Decision Making  Stable/Uncomplicated    Rehab Potential  Good    Clinical Impairments Affecting Rehab Potential  (-) hx of MS; (+) good motivation    PT Frequency  2x / week    PT Duration  4 weeks    PT Treatment/Interventions  Moist Heat;Iontophoresis 4mg /ml Dexamethasone;Electrical Stimulation;Aquatic Therapy;Cryotherapy;Therapeutic activities;Neuromuscular re-education;Therapeutic exercise;Patient/family education;Dry needling;Manual techniques;Passive range of motion;Balance training;Functional mobility training;Gait training;Stair training;Joint Manipulations    PT Next Visit Plan  Continue addressing static (eyes closed), dynamic balance exercises, plyometric power movements, as well as incorporating more dual tasking and coordination activities.    PT Home Exercise Plan  See education    Consulted and Agree with Plan of Care  Patient        Patient will benefit from skilled therapeutic intervention in order to improve the following deficits and impairments:  Pain, Decreased coordination, Impaired perceived functional ability, Increased fascial restricitons, Increased muscle spasms, Difficulty walking, Abnormal gait, Decreased balance, Decreased mobility, Impaired sensation  Visit Diagnosis: Muscle weakness (generalized)  Difficulty in walking, not elsewhere classified  History of falling  Problem List There are no active problems to display for this patient.   Scarlette Calico, SPT 11/18/2018, 7:27 PM  [place] [facility] [address] [place], [address] Phone: [phone]   Fax:  [phone]  Name: Michelle Ruiz MRN: [id number] Date of Birth: 1970-05-03

## 2018-11-20 ENCOUNTER — Ambulatory Visit: Payer: BC Managed Care – PPO

## 2018-11-20 ENCOUNTER — Other Ambulatory Visit: Payer: Self-pay

## 2018-11-20 DIAGNOSIS — M6281 Muscle weakness (generalized): Secondary | ICD-10-CM

## 2018-11-20 DIAGNOSIS — R262 Difficulty in walking, not elsewhere classified: Secondary | ICD-10-CM

## 2018-11-20 DIAGNOSIS — Z9181 History of falling: Secondary | ICD-10-CM

## 2018-11-21 NOTE — Therapy (Signed)
[facility] [address], [place], [address] Phone: [phone]   Fax:  [phone]  Physical Therapy Treatment Physical Therapy Re-Certification Dates: 10/21/2018-11/20/2018  Patient Details  Name: Michelle Ruiz MRN: [id number] Date of Birth: 1970/12/18 Referring Provider (PT): Darrin Luis MD   Encounter Date: 11/20/2018  PT End of Session - 11/21/18 0903    Visit Number  48    Number of Visits  52    Date for PT Re-Evaluation  11/18/18    Authorization Type  BCBS    PT Start Time  1745    PT Stop Time  [id number]    PT Time Calculation (min)  45 min    Activity Tolerance  Patient tolerated treatment well    Behavior During Therapy  [facility] for tasks assessed/performed       Past Medical History:  Diagnosis Date   Abnormal Pap smear of cervix    Anxiety    Kidney stones    MS (multiple sclerosis) ([place])    UTI (urinary tract infection)     Past Surgical History:  Procedure Laterality Date   BREAST BIOPSY Right 2010   stereotactic biopsy/ neg/ dr.byrnett   BREAST BIOPSY Left 2012   neg/dr. brynett   CESAREAN SECTION  2008   [facility]   TONSILLECTOMY  2006    There were no vitals filed for this visit.  Subjective Assessment - 11/21/18 0902    Subjective  Pt states current 0/10 pain and no major changes since last session.    Patient is accompained by:  Family member    Pertinent History  Patient reports history of falls x3 in the past 6 months secondary to drop foot on the L LE. PMH: Multiple sclerosis; elbow fracture of the R UE    Limitations  Standing;Walking    How long can you stand comfortably?  20 min    How long can you walk comfortably?  1 miles    Patient Stated Goals  To be more balanced and core strengthening     Currently in Pain?  No/denies    Pain Score  0-No pain    Pain Onset  More than a month ago       TREATMENT  Therapeutic Exercise to address and improve her strength,  activity tolerance, and power with mobility.   Side to side leg swings - x 43min B Forward leg swings - x 28min B DL Jumps 2x6 with UE support, without UE support Goblet squats 10#- x 12  Step ups with high knees 6 step, without UE support - x10 each leg    Neuromuscular Re-education to improve strength and facilitate effective postural strategies to fall risk.   Penguin Walks 20 feet x4 Forward step overs 6 hurdles 15 feet x4, with foam pads 15 feet x6 with close SBA Backwards step overs 6 hurdles 15 x4 with close SBA Forward tandem walks on foam pad 15 feet x4, catch and throw ball Backwards tandem 15 feet x4 with close SBA    Pt performed all exercises without sx exacerbation or sx increase.    PT Education - 11/21/18 0902    Education Details  Educated on technique/form. HEP to continue as is.    Person(s) Educated  Patient    Methods  Explanation;Demonstration    Comprehension  Verbalized understanding;Returned demonstration          PT Long Term Goals - 11/21/18 [phone]  PT LONG TERM GOAL #1   Title  Patient will be independent with balance HEP to continue benefits of therapy after discharge.    Baseline  dependent with form/technique for balance exercise; 03/25/2018: Independent     Time  6    Period  Weeks    Status  Achieved      PT LONG TERM GOAL #2   Title  Patient will improve FGA by 4 points to indicate significant improvement with balancing while ambulating and decrease of fall risk    Baseline  03/25/2018: 28/30    Time  6    Period  Weeks    Status  Achieved      PT LONG TERM GOAL #3   Title  Patient will be able to perform prolonged single leg stance >10sec with eyes closed to indicate improvement with static balance and decrease fall risk    Baseline  2 sec in standing EC; 03/25/2018: 3 sec; 04/29/2018: 4 sec; 08/21/2018: 5 sec; 09/19/2018: 6 sec ; 10/21/2018 3 sec; 11/21/2018 5 sec    Time  4    Period  Weeks    Status  On-going      PT LONG  TERM GOAL #4   Title  Pt will improve 6 MWT >2060ft to demonstrate improved cardiorespiratory fitness    Baseline  Deferred to next session: 04/29/2018; 05/01/2018: 1444ft; 08/21/2018: 1459ft; 09/19/2018: 1449ft; 10/21/2018 1621; 11/21/2018 deferred to next session 2/2 time.    Time  4    Period  Weeks    Status  On-going            Plan - 11/21/18 0903    Clinical Impression Statement  Pt continues to be able to perform all exercises with improved tolerance and capacity, compared to previous sessions, as indicated by decreased rest breaks and observed/verbalized fatigue. Pt demonstrated improved technique and power with plyometric exercises (DL>SL), indicating improved SL/DL power and endurance. Pt able to perform tandem walking forward/backwards, as well as forward steps over 6 hurdles forward/backwards over foam pads, indicating significantly improved dynamic postural stability on compliant and level surfaces. Pt demonstrates improvement with overall activity, with focus on plyometrics and dynamic postural stability exercises. Pt still demonstrates increased difficulty with DL/SL plyometrics and DL/SL balance exercises (EO < EC), indicating impaired DL/SL power and dynamic postural stability. Pt able to perform SLS EC in ~5 seconds (without UE support) for each leg, which has improved since initial onset, as indicated by her increased confidence in her balance and decreased requirement for physical assistance/UE support. Patient will benefit from further skilled therapy to return to prior level of function.    Personal Factors and Comorbidities  Past/Current Experience    Examination-Activity Limitations  Locomotion Level    Examination-Participation Restrictions  Community Activity    Stability/Clinical Decision Making  Stable/Uncomplicated    Rehab Potential  Good    Clinical Impairments Affecting Rehab Potential  (-) hx of MS; (+) good motivation    PT Frequency  2x / week    PT Duration  4  weeks    PT Treatment/Interventions  Moist Heat;Iontophoresis 4mg /ml Dexamethasone;Electrical Stimulation;Aquatic Therapy;Cryotherapy;Therapeutic activities;Neuromuscular re-education;Therapeutic exercise;Patient/family education;Dry needling;Manual techniques;Passive range of motion;Balance training;Functional mobility training;Gait training;Stair training;Joint Manipulations    PT Next Visit Plan  Continue addressing static (eyes closed), dynamic balance exercises, plyometric power movements, as well as incorporating more dual tasking and coordination activities.    PT Home Exercise Plan  See education    Consulted and Agree with  Plan of Care  Patient       Patient will benefit from skilled therapeutic intervention in order to improve the following deficits and impairments:  Pain, Decreased coordination, Impaired perceived functional ability, Increased fascial restricitons, Increased muscle spasms, Difficulty walking, Abnormal gait, Decreased balance, Decreased mobility, Impaired sensation  Visit Diagnosis: Muscle weakness (generalized)  Difficulty in walking, not elsewhere classified  History of falling     Problem List There are no active problems to display for this patient.   Scarlette Calico, SPT 11/21/2018, 9:12 AM  [facility] [address], [place], [address] Phone: [phone]   Fax:  [phone]  Name: Michelle Ruiz MRN: [id number] Date of Birth: [birth year]

## 2018-11-26 ENCOUNTER — Other Ambulatory Visit: Payer: Self-pay

## 2018-11-26 ENCOUNTER — Ambulatory Visit: Payer: BC Managed Care – PPO | Attending: Neurology

## 2018-11-26 DIAGNOSIS — M6281 Muscle weakness (generalized): Secondary | ICD-10-CM | POA: Diagnosis not present

## 2018-11-26 DIAGNOSIS — Z9181 History of falling: Secondary | ICD-10-CM | POA: Diagnosis not present

## 2018-11-26 DIAGNOSIS — R262 Difficulty in walking, not elsewhere classified: Secondary | ICD-10-CM | POA: Diagnosis not present

## 2018-11-26 NOTE — Therapy (Signed)
[facility] [facility] [address] [place], [address] Phone: [phone]   Fax:  [phone]  Physical Therapy Treatment  Patient Details  Name: Michelle Ruiz MRN: [id number] Date of Birth: [birth year] Referring Provider (PT): Darrin Luis MD   Encounter Date: 11/26/2018  PT End of Session - 11/26/18 0925    Visit Number  79    Number of Visits  52    Date for PT Re-Evaluation  11/18/18    Authorization Type  BCBS    PT Start Time  0915    PT Stop Time  1000    PT Time Calculation (min)  45 min    Activity Tolerance  Patient tolerated treatment well    Behavior During Therapy  [facility] for tasks assessed/performed       Past Medical History:  Diagnosis Date  . Abnormal Pap smear of cervix   . Anxiety   . Kidney stones   . MS (multiple sclerosis) ([place])   . UTI (urinary tract infection)     Past Surgical History:  Procedure Laterality Date  . BREAST BIOPSY Right 2010   stereotactic biopsy/ neg/ dr.byrnett  . BREAST BIOPSY Left 2012   neg/dr. brynett  . CESAREAN SECTION  2008   [facility]  . TONSILLECTOMY  2006    There were no vitals filed for this visit.  Subjective Assessment - 11/26/18 0918    Subjective  Patient states no major changes and reports her foot does no longer hurt her intermediately.    Patient is accompained by:  Family member    Pertinent History  Patient reports history of falls x3 in the past 6 months secondary to drop foot on the L LE. PMH: Multiple sclerosis; elbow fracture of the R UE    Limitations  Standing;Walking    How long can you stand comfortably?  20 min    How long can you walk comfortably?  1 miles    Patient Stated Goals  To be more balanced and core strengthening     Currently in Pain?  No/denies    Pain Onset  More than a month ago             TREATMENT   Therapeutic Exercise to address and improve her strength, activity tolerance, and power with mobility.   Side  to side leg swings - x 53min B Forward leg swings - x 73min B Alternating single leg hops throughout agility ladder - 5 x 9fts Side stepping single leg hops throughout agility ladder - 2 x 65fts    Neuromuscular Re-education to improve strength and facilitate effective postural strategies to fall risk.   SLS on airex pad - 3 x 30sec B Tandem walking airex beam - x10 39ft Side stepping on airex beam - x 10 100ft Backwards tandem in standing - x 65ft x 4 Feet together ball toss on airex pad - x 20 Ambulating over balance stone -- x 10    Pt performed all exercises without sx exacerbation or sx increase.   PT Education - 11/26/18 0925    Education Details  form/technique with exercise    Person(s) Educated  Patient    Methods  Explanation;Demonstration    Comprehension  Verbalized understanding;Returned demonstration          PT Long Term Goals - 11/21/18 0906      PT LONG TERM GOAL #1   Title  Patient will be independent with balance HEP  to continue benefits of therapy after discharge.    Baseline  dependent with form/technique for balance exercise; 03/25/2018: Independent     Time  6    Period  Weeks    Status  Achieved      PT LONG TERM GOAL #2   Title  Patient will improve FGA by 4 points to indicate significant improvement with balancing while ambulating and decrease of fall risk    Baseline  03/25/2018: 28/30    Time  6    Period  Weeks    Status  Achieved      PT LONG TERM GOAL #3   Title  Patient will be able to perform prolonged single leg stance >10sec with eyes closed to indicate improvement with static balance and decrease fall risk    Baseline  2 sec in standing EC; 03/25/2018: 3 sec; 04/29/2018: 4 sec; 08/21/2018: 5 sec; 09/19/2018: 6 sec ; 10/21/2018 3 sec; 11/21/2018 5 sec    Time  4    Period  Weeks    Status  On-going      PT LONG TERM GOAL #4   Title  Pt will improve 6 MWT >2062ft to demonstrate improved cardiorespiratory fitness    Baseline  Deferred to  next session: 04/29/2018; 05/01/2018: 1421ft; 08/21/2018: 148ft; 09/19/2018: 1466ft; 10/21/2018 1621; 11/21/2018 deferred to next session 2/2 time.    Time  4    Period  Weeks    Status  On-going            Plan - 11/26/18 0932    Clinical Impression Statement  Patient demonstrates improvement overall with exercises but has difficulty with performing bounding exercises limiting ability to perform running and recreational walking activities. Patient demonstrates improvement overall with requiring less touch downs to regain balance during to the session most notably in singl leg stance and ambulation with within narrow BOS. Patient limitations include performing ballistic movements to maintain balance and patient will benefit from further skilled therapy to return to prior level of function.    Personal Factors and Comorbidities  Past/Current Experience    Examination-Activity Limitations  Locomotion Level    Examination-Participation Restrictions  Community Activity    Stability/Clinical Decision Making  Stable/Uncomplicated    Rehab Potential  Good    Clinical Impairments Affecting Rehab Potential  (-) hx of MS; (+) good motivation    PT Frequency  2x / week    PT Duration  4 weeks    PT Treatment/Interventions  Moist Heat;Iontophoresis 4mg /ml Dexamethasone;Electrical Stimulation;Aquatic Therapy;Cryotherapy;Therapeutic activities;Neuromuscular re-education;Therapeutic exercise;Patient/family education;Dry needling;Manual techniques;Passive range of motion;Balance training;Functional mobility training;Gait training;Stair training;Joint Manipulations    PT Next Visit Plan  Continue addressing static (eyes closed), dynamic balance exercises, plyometric power movements, as well as incorporating more dual tasking and coordination activities.    PT Home Exercise Plan  See education    Consulted and Agree with Plan of Care  Patient       Patient will benefit from skilled therapeutic intervention in  order to improve the following deficits and impairments:  Pain, Decreased coordination, Impaired perceived functional ability, Increased fascial restricitons, Increased muscle spasms, Difficulty walking, Abnormal gait, Decreased balance, Decreased mobility, Impaired sensation  Visit Diagnosis: Muscle weakness (generalized)  Difficulty in walking, not elsewhere classified  History of falling     Problem List There are no active problems to display for this patient.   [name], PT DPT 11/26/2018, 10:02 AM  [facility] [facility]  [address], [place], [address] Phone: [phone]   Fax:  [phone]  Name: Michelle Ruiz MRN: [id number] Date of Birth: September 18, 1970

## 2018-12-04 ENCOUNTER — Other Ambulatory Visit: Payer: Self-pay

## 2018-12-04 ENCOUNTER — Ambulatory Visit: Payer: BC Managed Care – PPO

## 2018-12-04 DIAGNOSIS — M6281 Muscle weakness (generalized): Secondary | ICD-10-CM

## 2018-12-04 DIAGNOSIS — Z9181 History of falling: Secondary | ICD-10-CM

## 2018-12-04 DIAGNOSIS — R262 Difficulty in walking, not elsewhere classified: Secondary | ICD-10-CM | POA: Diagnosis not present

## 2018-12-04 NOTE — Addendum Note (Signed)
Addended by: Blain Pais on: 12/04/2018 06:38 PM   Modules accepted: Orders

## 2018-12-04 NOTE — Therapy (Signed)
[facility] [address], [place], [address] Phone: [phone]   Fax:  [phone]  Physical Therapy Treatment  Patient Details  Name: Michelle Ruiz MRN: [id number] Date of Birth: 1970-06-08 Referring Provider (PT): Darrin Luis MD   Encounter Date: 12/04/2018  PT End of Session - 12/04/18 1910    Visit Number  50    Number of Visits  60    Date for PT Re-Evaluation  01/02/19    Authorization Type  BCBS    PT Start Time  1800    PT Stop Time  1845    PT Time Calculation (min)  45 min    Activity Tolerance  Patient tolerated treatment well    Behavior During Therapy  [facility] for tasks assessed/performed       Past Medical History:  Diagnosis Date  . Abnormal Pap smear of cervix   . Anxiety   . Kidney stones   . MS (multiple sclerosis) ([place])   . UTI (urinary tract infection)     Past Surgical History:  Procedure Laterality Date  . BREAST BIOPSY Right 2010   stereotactic biopsy/ neg/ dr.byrnett  . BREAST BIOPSY Left 2012   neg/dr. brynett  . CESAREAN SECTION  2008   [facility]  . TONSILLECTOMY  2006    There were no vitals filed for this visit.  Subjective Assessment - 12/04/18 1854    Subjective  Patient states she went on a walk the other day which she attempted to run but states it felt awkward to perform.    Patient is accompained by:  Family member    Pertinent History  Patient reports history of falls x3 in the past 6 months secondary to drop foot on the L LE. PMH: Multiple sclerosis; elbow fracture of the R UE    Limitations  Standing;Walking    How long can you stand comfortably?  20 min    How long can you walk comfortably?  1 miles    Patient Stated Goals  To be more balanced and core strengthening     Currently in Pain?  No/denies    Pain Onset  More than a month ago           TREATMENT   Therapeutic Exercise to address and improve her strength, activity tolerance, and power with  mobility.   Side to side leg swings - x 12min B Forward leg swings - x 70min B Alternating single leg hops- 5 x 61fts Walking lunges - 2 x 12fts Side lunges - 2 x 10 in standing     Neuromuscular Re-education to improve strength and facilitate effective postural strategies to fall risk.   SLS EC in standing - 3 x 30sec B Tandem airex pad ball toss - x10 66ft Feet together ball toss on airex pad - x 20     Pt performed exercised to improve LE coordination and strength   PT Education - 12/04/18 1900    Education Details  form/technique with exercise    Person(s) Educated  Patient    Methods  Explanation;Demonstration    Comprehension  Verbalized understanding;Returned demonstration          PT Long Term Goals - 11/21/18 0906      PT LONG TERM GOAL #1   Title  Patient will be independent with balance HEP to continue benefits of therapy after discharge.    Baseline  dependent with form/technique for balance exercise; 03/25/2018: Independent  Time  6    Period  Weeks    Status  Achieved      PT LONG TERM GOAL #2   Title  Patient will improve FGA by 4 points to indicate significant improvement with balancing while ambulating and decrease of fall risk    Baseline  03/25/2018: 28/30    Time  6    Period  Weeks    Status  Achieved      PT LONG TERM GOAL #3   Title  Patient will be able to perform prolonged single leg stance >10sec with eyes closed to indicate improvement with static balance and decrease fall risk    Baseline  2 sec in standing EC; 03/25/2018: 3 sec; 04/29/2018: 4 sec; 08/21/2018: 5 sec; 09/19/2018: 6 sec ; 10/21/2018 3 sec; 11/21/2018 5 sec    Time  4    Period  Weeks    Status  On-going      PT LONG TERM GOAL #4   Title  Pt will improve 6 MWT >2053ft to demonstrate improved cardiorespiratory fitness    Baseline  Deferred to next session: 04/29/2018; 05/01/2018: 1470ft; 08/21/2018: 1455ft; 09/19/2018: 1467ft; 10/21/2018 1621; 11/21/2018 deferred to next session 2/2  time.    Time  4    Period  Weeks    Status  On-going            Plan - 12/04/18 1911    Clinical Impression Statement  Worked towards improving running during towards session and patient demonstrates poor knee elevation, push off, an postural leans with running. Educated patient to perform, however, patient demosntrates increased fatigue with performance limiting ability to progress. Patient demonstrates difficulty with SLS EC requiring step out to maintain balance ~3 seconds. Increased lean to the R with performing L sided SLS indicating further balance  limitation. Patient will benefit from further skilled therapy to return to prior level of function.    Personal Factors and Comorbidities  Past/Current Experience    Examination-Activity Limitations  Locomotion Level    Examination-Participation Restrictions  Community Activity    Stability/Clinical Decision Making  Stable/Uncomplicated    Rehab Potential  Good    Clinical Impairments Affecting Rehab Potential  (-) hx of MS; (+) good motivation    PT Frequency  2x / week    PT Duration  4 weeks    PT Treatment/Interventions  Moist Heat;Iontophoresis 4mg /ml Dexamethasone;Electrical Stimulation;Aquatic Therapy;Cryotherapy;Therapeutic activities;Neuromuscular re-education;Therapeutic exercise;Patient/family education;Dry needling;Manual techniques;Passive range of motion;Balance training;Functional mobility training;Gait training;Stair training;Joint Manipulations    PT Next Visit Plan  Continue addressing static (eyes closed), dynamic balance exercises, plyometric power movements, as well as incorporating more dual tasking and coordination activities.    PT Home Exercise Plan  See education    Consulted and Agree with Plan of Care  Patient       Patient will benefit from skilled therapeutic intervention in order to improve the following deficits and impairments:  Pain, Decreased coordination, Impaired perceived functional ability,  Increased fascial restricitons, Increased muscle spasms, Difficulty walking, Abnormal gait, Decreased balance, Decreased mobility, Impaired sensation  Visit Diagnosis: Muscle weakness (generalized)  Difficulty in walking, not elsewhere classified  History of falling     Problem List There are no active problems to display for this patient.   [name], PT DPT 12/04/2018, 8:15 PM  [facility] [address] [place], [address] Phone: [phone]   Fax:  [phone]  Name: Michelle Ruiz MRN:  [id number] Date of Birth: [birth year]

## 2018-12-09 ENCOUNTER — Other Ambulatory Visit: Payer: Self-pay

## 2018-12-09 ENCOUNTER — Ambulatory Visit: Payer: BC Managed Care – PPO

## 2018-12-09 DIAGNOSIS — R262 Difficulty in walking, not elsewhere classified: Secondary | ICD-10-CM | POA: Diagnosis not present

## 2018-12-09 DIAGNOSIS — Z9181 History of falling: Secondary | ICD-10-CM

## 2018-12-09 DIAGNOSIS — M6281 Muscle weakness (generalized): Secondary | ICD-10-CM

## 2018-12-09 NOTE — Therapy (Signed)
[facility] [address] [place], [address] Phone: [phone]   Fax:  [phone]  Physical Therapy Treatment  Patient Details  Name: Michelle Ruiz MRN: [id number] Date of Birth: 16-Mar-1971 Referring Provider (PT): Darrin Luis MD   Encounter Date: 12/09/2018  PT End of Session - 12/09/18 1730    Visit Number  80    Number of Visits  60    Date for PT Re-Evaluation  01/02/19    Authorization Type  BCBS    PT Start Time  [id number]    PT Stop Time  1800    PT Time Calculation (min)  45 min    Activity Tolerance  Patient tolerated treatment well    Behavior During Therapy  [facility] for tasks assessed/performed       Past Medical History:  Diagnosis Date  . Abnormal Pap smear of cervix   . Anxiety   . Kidney stones   . MS (multiple sclerosis) ([place])   . UTI (urinary tract infection)     Past Surgical History:  Procedure Laterality Date  . BREAST BIOPSY Right 2010   stereotactic biopsy/ neg/ dr.byrnett  . BREAST BIOPSY Left 2012   neg/dr. brynett  . CESAREAN SECTION  2008   RPH  . TONSILLECTOMY  2006    There were no vitals filed for this visit.  Subjective Assessment - 12/09/18 1722    Subjective  Patient states she walked 7 out of 7 days to improve her cardiovascular endurance. Patient reports she has not tried running since the previous session.    Patient is accompained by:  Family member    Pertinent History  Patient reports history of falls x3 in the past 6 months secondary to drop foot on the L LE. PMH: Multiple sclerosis; elbow fracture of the R UE    Limitations  Standing;Walking    How long can you stand comfortably?  20 min    How long can you walk comfortably?  1 miles    Patient Stated Goals  To be more balanced and core strengthening     Currently in Pain?  No/denies    Pain Onset  More than a month ago       TREATMENT   Therapeutic Exercise to address and improve her strength,  activity tolerance, and power with mobility.   Side to side leg swings - x 42min B Forward leg swings - x 12min B Alternating single leg hops- 10 x 70fts Light jogging without UE support - 3 x 53ft  Sit to stand - x6 reps     Neuromuscular Re-education to improve strength and facilitate effective postural strategies to fall risk.   Forward/Backward tandem airex pad with ball toss - x8 38ft Feet together ball toss on airex pad - x 30   Pt performed exercised to improve LE coordination and strength   PT Education - 12/09/18 1729    Education Details  form/technique with exercise    Person(s) Educated  Patient    Methods  Explanation;Demonstration    Comprehension  Verbalized understanding;Returned demonstration          PT Long Term Goals - 11/21/18 0906      PT LONG TERM GOAL #1   Title  Patient will be independent with balance HEP to continue benefits of therapy after discharge.    Baseline  dependent with form/technique for balance exercise; 03/25/2018: Independent     Time  6  Period  Weeks    Status  Achieved      PT LONG TERM GOAL #2   Title  Patient will improve FGA by 4 points to indicate significant improvement with balancing while ambulating and decrease of fall risk    Baseline  03/25/2018: 28/30    Time  6    Period  Weeks    Status  Achieved      PT LONG TERM GOAL #3   Title  Patient will be able to perform prolonged single leg stance >10sec with eyes closed to indicate improvement with static balance and decrease fall risk    Baseline  2 sec in standing EC; 03/25/2018: 3 sec; 04/29/2018: 4 sec; 08/21/2018: 5 sec; 09/19/2018: 6 sec ; 10/21/2018 3 sec; 11/21/2018 5 sec    Time  4    Period  Weeks    Status  On-going      PT LONG TERM GOAL #4   Title  Pt will improve 6 MWT >2038ft to demonstrate improved cardiorespiratory fitness    Baseline  Deferred to next session: 04/29/2018; 05/01/2018: 143ft; 08/21/2018: 1461ft; 09/19/2018: 149ft; 10/21/2018 1621; 11/21/2018  deferred to next session 2/2 time.    Time  4    Period  Weeks    Status  On-going            Plan - 12/09/18 1751    Clinical Impression Statement  Patient demonstrates increased difficulty with performing tandem balance most notably with narrow BOS with walking. Patient able to perform sit to stands x 6 with 20# which is an improvement compared to presvious sessions which the patient was only able to perform with 10#. Will continue to progress this limitation and limitations with jogging to return to to recreational activities as well as decreasing fall risk.    Personal Factors and Comorbidities  Past/Current Experience    Examination-Activity Limitations  Locomotion Level    Examination-Participation Restrictions  Community Activity    Stability/Clinical Decision Making  Stable/Uncomplicated    Rehab Potential  Good    Clinical Impairments Affecting Rehab Potential  (-) hx of MS; (+) good motivation    PT Frequency  2x / week    PT Duration  4 weeks    PT Treatment/Interventions  Moist Heat;Iontophoresis 4mg /ml Dexamethasone;Electrical Stimulation;Aquatic Therapy;Cryotherapy;Therapeutic activities;Neuromuscular re-education;Therapeutic exercise;Patient/family education;Dry needling;Manual techniques;Passive range of motion;Balance training;Functional mobility training;Gait training;Stair training;Joint Manipulations    PT Next Visit Plan  Continue addressing static (eyes closed), dynamic balance exercises, plyometric power movements, as well as incorporating more dual tasking and coordination activities.    PT Home Exercise Plan  See education    Consulted and Agree with Plan of Care  Patient       Patient will benefit from skilled therapeutic intervention in order to improve the following deficits and impairments:  Pain, Decreased coordination, Impaired perceived functional ability, Increased fascial restricitons, Increased muscle spasms, Difficulty walking, Abnormal gait, Decreased  balance, Decreased mobility, Impaired sensation  Visit Diagnosis: Difficulty in walking, not elsewhere classified  Muscle weakness (generalized)  History of falling     Problem List There are no active problems to display for this patient.   [name], PT DPT 12/09/2018, 6:02 PM  [facility] [address] [place], [address] Phone: [phone]   Fax:  [phone]  Name: ARMENDA ETHERIDGE MRN: [id number] Date of Birth: 06-16-1970

## 2018-12-11 ENCOUNTER — Other Ambulatory Visit: Payer: Self-pay

## 2018-12-11 ENCOUNTER — Ambulatory Visit: Payer: BC Managed Care – PPO

## 2018-12-11 DIAGNOSIS — M6281 Muscle weakness (generalized): Secondary | ICD-10-CM | POA: Diagnosis not present

## 2018-12-11 DIAGNOSIS — R262 Difficulty in walking, not elsewhere classified: Secondary | ICD-10-CM

## 2018-12-11 DIAGNOSIS — Z9181 History of falling: Secondary | ICD-10-CM | POA: Diagnosis not present

## 2018-12-11 NOTE — Therapy (Signed)
[facility] [address], [place], [address] Phone: [phone]   Fax:  [phone]  Physical Therapy Treatment  Patient Details  Name: Michelle Ruiz MRN: [id number] Date of Birth: 1971-01-02 Referring Provider (PT): Darrin Luis MD   Encounter Date: 12/11/2018  PT End of Session - 12/11/18 1718    Visit Number  52    Number of Visits  60    Date for PT Re-Evaluation  01/02/19    Authorization Type  BCBS    PT Start Time  [id number]    PT Stop Time  1800    PT Time Calculation (min)  45 min    Activity Tolerance  Patient tolerated treatment well    Behavior During Therapy  [facility] for tasks assessed/performed       Past Medical History:  Diagnosis Date  . Abnormal Pap smear of cervix   . Anxiety   . Kidney stones   . MS (multiple sclerosis) ([place])   . UTI (urinary tract infection)     Past Surgical History:  Procedure Laterality Date  . BREAST BIOPSY Right 2010   stereotactic biopsy/ neg/ dr.byrnett  . BREAST BIOPSY Left 2012   neg/dr. brynett  . CESAREAN SECTION  2008   RPH  . TONSILLECTOMY  2006    There were no vitals filed for this visit.  Subjective Assessment - 12/11/18 1717    Subjective  Patient reports no changes since last session. Has not tried running.    Patient is accompained by:  Family member    Pertinent History  Patient reports history of falls x3 in the past 6 months secondary to drop foot on the L LE. PMH: Multiple sclerosis; elbow fracture of the R UE    Limitations  Standing;Walking    How long can you stand comfortably?  20 min    How long can you walk comfortably?  1 miles    Patient Stated Goals  To be more balanced and core strengthening     Currently in Pain?  No/denies    Pain Onset  More than a month ago         Therex Leg swings flexion/ext and lateral/medial to increase tissue perfusion and improve therapy tolerance Sit <> stand 30#, fast tempo sit <> stand 20#  to improve extension power for dynamic stability and running gait; patient demonstrates lat-med hip instability  Step ups with mirror feedback Right left x 20 cues for knee stability to avoid valgus collapse Step ups onto foam with contralateral hip flexion for SLS dynamic stability approximating running motion Bounding 6 x 10 ft for hip stability and power Bounding with increased tempo 6 x 10 ft for hip stability and power - patient demonstrates lateral deviation in line of progression bilaterally Bounding with visual cues to increase distance for reduction of laterality of line of progression Running 300 ft - patient  Glute med hip hikes x 20 for lateral hip stability with SLS Resisted walking with verbal tactile cues for extension phase of gait to improve hip extension strength and stride length   Cryotherapy UNBILLED two ice packs over carotid/shoulder for improved recovery and comfort and reduction of core temperature to reduce heat exacerbation of pt's systemic neurological condition  Education: Form/technique with exercise; reinforced exercise performance at home for goal attainment   PT Long Term Goals - 11/21/18 0906      PT LONG TERM GOAL #1   Title  Patient  will be independent with balance HEP to continue benefits of therapy after discharge.    Baseline  dependent with form/technique for balance exercise; 03/25/2018: Independent     Time  6    Period  Weeks    Status  Achieved      PT LONG TERM GOAL #2   Title  Patient will improve FGA by 4 points to indicate significant improvement with balancing while ambulating and decrease of fall risk    Baseline  03/25/2018: 28/30    Time  6    Period  Weeks    Status  Achieved      PT LONG TERM GOAL #3   Title  Patient will be able to perform prolonged single leg stance >10sec with eyes closed to indicate improvement with static balance and decrease fall risk    Baseline  2 sec in standing EC; 03/25/2018: 3 sec; 04/29/2018: 4 sec;  08/21/2018: 5 sec; 09/19/2018: 6 sec ; 10/21/2018 3 sec; 11/21/2018 5 sec    Time  4    Period  Weeks    Status  On-going      PT LONG TERM GOAL #4   Title  Pt will improve 6 MWT >2049ft to demonstrate improved cardiorespiratory fitness    Baseline  Deferred to next session: 04/29/2018; 05/01/2018: 1422ft; 08/21/2018: 145ft; 09/19/2018: 1416ft; 10/21/2018 1621; 11/21/2018 deferred to next session 2/2 time.    Time  4    Period  Weeks    Status  On-going            Plan - 12/12/18 0841    Clinical Impression Statement  Patient shows improvement in bounding exercises and extension power with visual and verbal cues. Knee valgus with sit <> stand and step-ups corrected with mirror feedback and cues but additional work needed to [employer]. Plan to continue progession towards jogging to return to recreational activities. Patient will benefit from additional skilled physical therapy to reduce fall risk and return to PLOF.    Personal Factors and Comorbidities  Past/Current Experience    Examination-Activity Limitations  Locomotion Level    Examination-Participation Restrictions  Community Activity    Stability/Clinical Decision Making  Stable/Uncomplicated    Rehab Potential  Good    Clinical Impairments Affecting Rehab Potential  (-) hx of MS; (+) good motivation    PT Frequency  2x / week    PT Duration  4 weeks    PT Treatment/Interventions  Moist Heat;Iontophoresis 4mg /ml Dexamethasone;Electrical Stimulation;Aquatic Therapy;Cryotherapy;Therapeutic activities;Neuromuscular re-education;Therapeutic exercise;Patient/family education;Dry needling;Manual techniques;Passive range of motion;Balance training;Functional mobility training;Gait training;Stair training;Joint Manipulations    PT Next Visit Plan  Continue addressing static (eyes closed), dynamic balance exercises, plyometric power movements, as well as incorporating more dual tasking and coordination activities.    PT Home Exercise Plan  See  education    Consulted and Agree with Plan of Care  Patient       Patient will benefit from skilled therapeutic intervention in order to improve the following deficits and impairments:  Pain, Decreased coordination, Impaired perceived functional ability, Increased fascial restricitons, Increased muscle spasms, Difficulty walking, Abnormal gait, Decreased balance, Decreased mobility, Impaired sensation  Visit Diagnosis: Difficulty in walking, not elsewhere classified  Muscle weakness (generalized)  History of falling     Problem List There are no active problems to display for this patient.   [name], SPT 12/12/2018, 8:51 AM  [facility] [address] [place], [address] Phone:  [phone]   Fax:  [phone]  Name: Michelle Ruiz MRN: [id number] Date of Birth: November 02, 1970

## 2018-12-16 ENCOUNTER — Other Ambulatory Visit: Payer: Self-pay

## 2018-12-16 ENCOUNTER — Ambulatory Visit: Payer: BC Managed Care – PPO

## 2018-12-16 DIAGNOSIS — Z9181 History of falling: Secondary | ICD-10-CM

## 2018-12-16 DIAGNOSIS — R262 Difficulty in walking, not elsewhere classified: Secondary | ICD-10-CM

## 2018-12-16 DIAGNOSIS — M6281 Muscle weakness (generalized): Secondary | ICD-10-CM

## 2018-12-16 NOTE — Therapy (Signed)
[facility] [address] [place], [address] Phone: [phone]   Fax:  [phone]  Physical Therapy Treatment  Patient Details  Name: Michelle Ruiz MRN: [id number] Date of Birth: 1970/07/10 Referring Provider (PT): Darrin Luis MD   Encounter Date: 12/16/2018  PT End of Session - 12/16/18 1744    Visit Number  53    Number of Visits  60    Date for PT Re-Evaluation  01/02/19    Authorization Type  BCBS    PT Start Time  [id number]    PT Stop Time  1800    PT Time Calculation (min)  45 min    Activity Tolerance  Patient tolerated treatment well    Behavior During Therapy  [facility] for tasks assessed/performed       Past Medical History:  Diagnosis Date  . Abnormal Pap smear of cervix   . Anxiety   . Kidney stones   . MS (multiple sclerosis) ([place])   . UTI (urinary tract infection)     Past Surgical History:  Procedure Laterality Date  . BREAST BIOPSY Right 2010   stereotactic biopsy/ neg/ dr.byrnett  . BREAST BIOPSY Left 2012   neg/dr. brynett  . CESAREAN SECTION  2008   RPH  . TONSILLECTOMY  2006    There were no vitals filed for this visit.  Subjective Assessment - 12/16/18 1740    Subjective  Patient states she practiced running once since the previous session. Patient states no major changes overwise.    Patient is accompained by:  Family member    Pertinent History  Patient reports history of falls x3 in the past 6 months secondary to drop foot on the L LE. PMH: Multiple sclerosis; elbow fracture of the R UE    Limitations  Standing;Walking    How long can you stand comfortably?  20 min    How long can you walk comfortably?  1 miles    Patient Stated Goals  To be more balanced and core strengthening     Currently in Pain?  No/denies    Pain Onset  More than a month ago         Therex Leg swings flexion/ext and lateral/medial to increase tissue perfusion and improve therapy  tolerance Single leg hops B -- 2 x 10 to improve balance and power output for runninf Lateral/forward backward hops on R -- x 10 each direction (Not performed on L secondary to strength limitations Soccer kicks to L side to improve accuracy of L sided motion -- x 20  Bounding 6 x 10 ft for hip stability and power Bounding with increased tempo 6 x 10 ft for hip stability and power - patient demonstrates lateral deviation in line of progression bilaterally Bounding with visual cues to increase distance for reduction of laterality of line of progression Running 300 ft - patient    Cryotherapy UNBILLED two ice packs over carotid/shoulder for improved recovery and comfort and reduction of core temperature to reduce heat exacerbation of pt's systemic neurological condition    PT Education - 12/16/18 1744    Education Details  form/technique with exercise    Person(s) Educated  Patient    Methods  Explanation;Demonstration    Comprehension  Verbalized understanding;Returned demonstration          PT Long Term Goals - 11/21/18 0906      PT LONG TERM GOAL #1   Title  Patient will be  independent with balance HEP to continue benefits of therapy after discharge.    Baseline  dependent with form/technique for balance exercise; 03/25/2018: Independent     Time  6    Period  Weeks    Status  Achieved      PT LONG TERM GOAL #2   Title  Patient will improve FGA by 4 points to indicate significant improvement with balancing while ambulating and decrease of fall risk    Baseline  03/25/2018: 28/30    Time  6    Period  Weeks    Status  Achieved      PT LONG TERM GOAL #3   Title  Patient will be able to perform prolonged single leg stance >10sec with eyes closed to indicate improvement with static balance and decrease fall risk    Baseline  2 sec in standing EC; 03/25/2018: 3 sec; 04/29/2018: 4 sec; 08/21/2018: 5 sec; 09/19/2018: 6 sec ; 10/21/2018 3 sec; 11/21/2018 5 sec    Time  4    Period   Weeks    Status  On-going      PT LONG TERM GOAL #4   Title  Pt will improve 6 MWT >2068ft to demonstrate improved cardiorespiratory fitness    Baseline  Deferred to next session: 04/29/2018; 05/01/2018: 1472ft; 08/21/2018: 1435ft; 09/19/2018: 1433ft; 10/21/2018 1621; 11/21/2018 deferred to next session 2/2 time.    Time  4    Period  Weeks    Status  On-going            Plan - 12/16/18 1825    Clinical Impression Statement  Patient demonstrates improvement overall most notably with running and jogging. Although patient is improving,  she continues to have difficulty with narrow BOS limiting ability to perform activities such as donning/doffing pants in stadning wihtout UE support. Patient is improving and will benfit from further skilled therapy to return to prior level of function.    Personal Factors and Comorbidities  Past/Current Experience    Examination-Activity Limitations  Locomotion Level    Examination-Participation Restrictions  Community Activity    Stability/Clinical Decision Making  Stable/Uncomplicated    Rehab Potential  Good    Clinical Impairments Affecting Rehab Potential  (-) hx of MS; (+) good motivation    PT Frequency  2x / week    PT Duration  4 weeks    PT Treatment/Interventions  Moist Heat;Iontophoresis 4mg /ml Dexamethasone;Electrical Stimulation;Aquatic Therapy;Cryotherapy;Therapeutic activities;Neuromuscular re-education;Therapeutic exercise;Patient/family education;Dry needling;Manual techniques;Passive range of motion;Balance training;Functional mobility training;Gait training;Stair training;Joint Manipulations    PT Next Visit Plan  Continue addressing static (eyes closed), dynamic balance exercises, plyometric power movements, as well as incorporating more dual tasking and coordination activities.    PT Home Exercise Plan  See education    Consulted and Agree with Plan of Care  Patient       Patient will benefit from skilled therapeutic intervention in  order to improve the following deficits and impairments:  Pain, Decreased coordination, Impaired perceived functional ability, Increased fascial restricitons, Increased muscle spasms, Difficulty walking, Abnormal gait, Decreased balance, Decreased mobility, Impaired sensation  Visit Diagnosis: Difficulty in walking, not elsewhere classified  Muscle weakness (generalized)  History of falling     Problem List There are no active problems to display for this patient.   [name], PT DPT 12/16/2018, 6:28 PM  [facility] [address] [place], [address] Phone: [phone]   Fax:  [phone]  Name:  Michelle Ruiz MRN: [id number] Date of Birth: February 20, 1971

## 2018-12-18 ENCOUNTER — Other Ambulatory Visit: Payer: Self-pay

## 2018-12-18 ENCOUNTER — Ambulatory Visit: Payer: BC Managed Care – PPO

## 2018-12-18 DIAGNOSIS — R262 Difficulty in walking, not elsewhere classified: Secondary | ICD-10-CM

## 2018-12-18 DIAGNOSIS — M6281 Muscle weakness (generalized): Secondary | ICD-10-CM

## 2018-12-18 DIAGNOSIS — Z9181 History of falling: Secondary | ICD-10-CM | POA: Diagnosis not present

## 2018-12-19 NOTE — Therapy (Signed)
[place] [facility] [address] [place], [address] Phone: [phone]   Fax:  [phone]  Physical Therapy Treatment  Patient Details  Name: Michelle Ruiz MRN: [id number] Date of Birth: 1970/05/28 Referring Provider (PT): Darrin Luis MD   Encounter Date: 12/18/2018  PT End of Session - 12/18/18 1727    Visit Number  54    Number of Visits  60    Date for PT Re-Evaluation  01/02/19    Authorization Type  BCBS    PT Start Time  [id number]    PT Stop Time  1800    PT Time Calculation (min)  45 min    Activity Tolerance  Patient tolerated treatment well    Behavior During Therapy  [facility] for tasks assessed/performed       Past Medical History:  Diagnosis Date  . Abnormal Pap smear of cervix   . Anxiety   . Kidney stones   . MS (multiple sclerosis) ([place])   . UTI (urinary tract infection)     Past Surgical History:  Procedure Laterality Date  . BREAST BIOPSY Right 2010   stereotactic biopsy/ neg/ dr.byrnett  . BREAST BIOPSY Left 2012   neg/dr. brynett  . CESAREAN SECTION  2008   RPH  . TONSILLECTOMY  2006    There were no vitals filed for this visit.  Subjective Assessment - 12/18/18 1721    Subjective  Patient reports increased soreness along her hamstrings bilaterally. Patient reports no other significant changes.    Patient is accompained by:  Family member    Pertinent History  Patient reports history of falls x3 in the past 6 months secondary to drop foot on the L LE. PMH: Multiple sclerosis; elbow fracture of the R UE    Limitations  Standing;Walking    How long can you stand comfortably?  20 min    How long can you walk comfortably?  1 miles    Patient Stated Goals  To be more balanced and core strengthening     Currently in Pain?  No/denies    Pain Onset  More than a month ago       TREATMENT  Therex Leg swings flexion/ext and lateral/medial to increase tissue perfusion and improve therapy  tolerance Single leg stance balance - 3 x 30sec Single leg stance on large dynadisc - 2 x 30sec Single leg hamstring stretch - x 10 B with 5 sec hold Soccer kicks to L side to improve accuracy of L sided motion -- x 35 Running 100 ft - patient cued on improving pace and improving foot clearance on the affected side  Standing feet together on airex pad with ball toss - x20  Standing feet apart on Bosu ball- x 10 head turns  Single leg straight leg dead lifts in standing  -- x 10 B   Performed exercises to improve power, strength and single leg balance with exercise    PT Education - 12/18/18 1725    Education Details  form technique with exercise    Person(s) Educated  Patient    Methods  Explanation;Demonstration    Comprehension  Verbalized understanding;Returned demonstration          PT Long Term Goals - 11/21/18 0906      PT LONG TERM GOAL #1   Title  Patient will be independent with balance HEP to continue benefits of therapy after discharge.    Baseline  dependent with form/technique for  balance exercise; 03/25/2018: Independent     Time  6    Period  Weeks    Status  Achieved      PT LONG TERM GOAL #2   Title  Patient will improve FGA by 4 points to indicate significant improvement with balancing while ambulating and decrease of fall risk    Baseline  03/25/2018: 28/30    Time  6    Period  Weeks    Status  Achieved      PT LONG TERM GOAL #3   Title  Patient will be able to perform prolonged single leg stance >10sec with eyes closed to indicate improvement with static balance and decrease fall risk    Baseline  2 sec in standing EC; 03/25/2018: 3 sec; 04/29/2018: 4 sec; 08/21/2018: 5 sec; 09/19/2018: 6 sec ; 10/21/2018 3 sec; 11/21/2018 5 sec    Time  4    Period  Weeks    Status  On-going      PT LONG TERM GOAL #4   Title  Pt will improve 6 MWT >2082ft to demonstrate improved cardiorespiratory fitness    Baseline  Deferred to next session: 04/29/2018; 05/01/2018:  1424ft; 08/21/2018: 1462ft; 09/19/2018: 1473ft; 10/21/2018 1621; 11/21/2018 deferred to next session 2/2 time.    Time  4    Period  Weeks    Status  On-going            Plan - 12/18/18 1743    Clinical Impression Statement  Patient demonstrates improvement with running today requiring overall less cueing to perform. Performed greater amount of balancing today secondary to increased soreness along her hamstring. Patient required increaed sitting rest breaks secondary to fatigue and hamstring soreness indicating decreased muscular endurance and patient will benefit from further skilled therapy to return to prior level of function.    Personal Factors and Comorbidities  Past/Current Experience    Examination-Activity Limitations  Locomotion Level    Examination-Participation Restrictions  Community Activity    Stability/Clinical Decision Making  Stable/Uncomplicated    Rehab Potential  Good    Clinical Impairments Affecting Rehab Potential  (-) hx of MS; (+) good motivation    PT Frequency  2x / week    PT Duration  4 weeks    PT Treatment/Interventions  Moist Heat;Iontophoresis 4mg /ml Dexamethasone;Electrical Stimulation;Aquatic Therapy;Cryotherapy;Therapeutic activities;Neuromuscular re-education;Therapeutic exercise;Patient/family education;Dry needling;Manual techniques;Passive range of motion;Balance training;Functional mobility training;Gait training;Stair training;Joint Manipulations    PT Next Visit Plan  Continue addressing static (eyes closed), dynamic balance exercises, plyometric power movements, as well as incorporating more dual tasking and coordination activities.    PT Home Exercise Plan  See education    Consulted and Agree with Plan of Care  Patient       Patient will benefit from skilled therapeutic intervention in order to improve the following deficits and impairments:  Pain, Decreased coordination, Impaired perceived functional ability, Increased fascial restricitons,  Increased muscle spasms, Difficulty walking, Abnormal gait, Decreased balance, Decreased mobility, Impaired sensation  Visit Diagnosis: Difficulty in walking, not elsewhere classified  Muscle weakness (generalized)  History of falling     Problem List There are no active problems to display for this patient.   [name], PT DPT 12/19/2018, 8:19 AM  [facility] [address], [place], [address] Phone: [phone]   Fax:  [phone]  Name: Michelle Ruiz MRN: [id number] Date of Birth: [birth year]

## 2018-12-23 ENCOUNTER — Other Ambulatory Visit: Payer: Self-pay

## 2018-12-23 ENCOUNTER — Ambulatory Visit: Payer: BC Managed Care – PPO

## 2018-12-23 DIAGNOSIS — M6281 Muscle weakness (generalized): Secondary | ICD-10-CM | POA: Diagnosis not present

## 2018-12-23 DIAGNOSIS — R262 Difficulty in walking, not elsewhere classified: Secondary | ICD-10-CM

## 2018-12-23 DIAGNOSIS — Z9181 History of falling: Secondary | ICD-10-CM

## 2018-12-23 NOTE — Therapy (Signed)
[place] [facility] [address] [place], [address] Phone: [phone]   Fax:  [phone]  Physical Therapy Treatment  Patient Details  Name: Michelle Ruiz MRN: [id number] Date of Birth: [birth year] Referring Provider (PT): Darrin Luis MD   Encounter Date: 12/23/2018  PT End of Session - 12/23/18 1717    Visit Number  55    Number of Visits  60    Date for PT Re-Evaluation  01/02/19    Authorization Type  BCBS    PT Start Time  [id number]    PT Stop Time  1800    PT Time Calculation (min)  45 min    Activity Tolerance  Patient tolerated treatment well    Behavior During Therapy  [facility] for tasks assessed/performed       Past Medical History:  Diagnosis Date  . Abnormal Pap smear of cervix   . Anxiety   . Kidney stones   . MS (multiple sclerosis) ([place])   . UTI (urinary tract infection)     Past Surgical History:  Procedure Laterality Date  . BREAST BIOPSY Right 2010   stereotactic biopsy/ neg/ dr.byrnett  . BREAST BIOPSY Left 2012   neg/dr. brynett  . CESAREAN SECTION  2008   RPH  . TONSILLECTOMY  2006    There were no vitals filed for this visit.  Subjective Assessment - 12/23/18 1715    Subjective  Patient states no major changes since the previous session. Patient states she has been walking and tried running once.    Patient is accompained by:  Family member    Pertinent History  Patient reports history of falls x3 in the past 6 months secondary to drop foot on the L LE. PMH: Multiple sclerosis; elbow fracture of the R UE    Limitations  Standing;Walking    How long can you stand comfortably?  20 min    How long can you walk comfortably?  1 miles    Patient Stated Goals  To be more balanced and core strengthening     Currently in Pain?  No/denies    Pain Onset  More than a month ago       TREATMENT Ther ex Leg swings flexion/ext and lateral/medial to increase tissue perfusion and improve therapy  tolerance Bounding 6 x 10 ft for hip stability and power Single leg hops B -- x 10 to improve balance and power output for running  Lateral/forward backward hops on B -- x 10 each direction B LE Soccer kicks to L side to improve accuracy of L sided motion -- x 30  Feet together balance on airex beam with EC - 5 x 20 Tandem Balance walking - x 10 on airex beam  Ball toss with B LE on airex beam -- x 20  Performed exercise in standing to improve SLS balance, power, and endurance    PT Education - 12/23/18 1716    Education Details  form/technique with exercise    Person(s) Educated  Patient    Methods  Explanation;Demonstration    Comprehension  Verbalized understanding;Returned demonstration          PT Long Term Goals - 11/21/18 0906      PT LONG TERM GOAL #1   Title  Patient will be independent with balance HEP to continue benefits of therapy after discharge.    Baseline  dependent with form/technique for balance exercise; 03/25/2018: Independent     Time  6    Period  Weeks    Status  Achieved      PT LONG TERM GOAL #2   Title  Patient will improve FGA by 4 points to indicate significant improvement with balancing while ambulating and decrease of fall risk    Baseline  03/25/2018: 28/30    Time  6    Period  Weeks    Status  Achieved      PT LONG TERM GOAL #3   Title  Patient will be able to perform prolonged single leg stance >10sec with eyes closed to indicate improvement with static balance and decrease fall risk    Baseline  2 sec in standing EC; 03/25/2018: 3 sec; 04/29/2018: 4 sec; 08/21/2018: 5 sec; 09/19/2018: 6 sec ; 10/21/2018 3 sec; 11/21/2018 5 sec    Time  4    Period  Weeks    Status  On-going      PT LONG TERM GOAL #4   Title  Pt will improve 6 MWT >2082ft to demonstrate improved cardiorespiratory fitness    Baseline  Deferred to next session: 04/29/2018; 05/01/2018: 1472ft; 08/21/2018: 1468ft; 09/19/2018: 1463ft; 10/21/2018 1621; 11/21/2018 deferred to next  session 2/2 time.    Time  4    Period  Weeks    Status  On-going            Plan - 12/23/18 1908    Clinical Impression Statement  Patient demonstrates difficulty performing EC balance on a compliant surface indicating poor propioception without visual control. Continued to address balance in SLS with running/bounding and patient is making gradual improvements. Although patient is improving, she frequentl requiring stepping out to maintain balance. Patient will benefit from further skilled therapy to return to prior level of function.    Personal Factors and Comorbidities  Past/Current Experience    Examination-Activity Limitations  Locomotion Level    Examination-Participation Restrictions  Community Activity    Stability/Clinical Decision Making  Stable/Uncomplicated    Rehab Potential  Good    Clinical Impairments Affecting Rehab Potential  (-) hx of MS; (+) good motivation    PT Frequency  2x / week    PT Duration  4 weeks    PT Treatment/Interventions  Moist Heat;Iontophoresis 4mg /ml Dexamethasone;Electrical Stimulation;Aquatic Therapy;Cryotherapy;Therapeutic activities;Neuromuscular re-education;Therapeutic exercise;Patient/family education;Dry needling;Manual techniques;Passive range of motion;Balance training;Functional mobility training;Gait training;Stair training;Joint Manipulations    PT Next Visit Plan  Continue addressing static (eyes closed), dynamic balance exercises, plyometric power movements, as well as incorporating more dual tasking and coordination activities.    PT Home Exercise Plan  See education    Consulted and Agree with Plan of Care  Patient       Patient will benefit from skilled therapeutic intervention in order to improve the following deficits and impairments:  Pain, Decreased coordination, Impaired perceived functional ability, Increased fascial restricitons, Increased muscle spasms, Difficulty walking, Abnormal gait, Decreased balance, Decreased  mobility, Impaired sensation  Visit Diagnosis: Difficulty in walking, not elsewhere classified  Muscle weakness (generalized)  History of falling     Problem List There are no active problems to display for this patient.   [name], PT DPT 12/23/2018, 7:38 PM  [place] [facility] [address] [place], [address] Phone: [phone]   Fax:  [phone]  Name: Michelle Ruiz MRN: [id number] Date of Birth: 1970/12/19

## 2018-12-25 ENCOUNTER — Other Ambulatory Visit: Payer: Self-pay

## 2018-12-25 ENCOUNTER — Ambulatory Visit: Payer: BC Managed Care – PPO

## 2018-12-25 DIAGNOSIS — Z9181 History of falling: Secondary | ICD-10-CM | POA: Diagnosis not present

## 2018-12-25 DIAGNOSIS — R262 Difficulty in walking, not elsewhere classified: Secondary | ICD-10-CM

## 2018-12-25 DIAGNOSIS — M6281 Muscle weakness (generalized): Secondary | ICD-10-CM

## 2018-12-25 NOTE — Therapy (Signed)
[place] [address], [place], [address] Phone: [phone]   Fax:  [phone]  Physical Therapy Treatment  Patient Details  Name: Michelle Ruiz MRN: [id number] Date of Birth: [birth year] Referring Provider (PT): Darrin Luis MD   Encounter Date: 12/25/2018  PT End of Session - 12/25/18 1730    Visit Number  23    Number of Visits  60    Date for PT Re-Evaluation  01/02/19    Authorization Type  BCBS    PT Start Time  [id number]    PT Stop Time  1800    PT Time Calculation (min)  45 min    Activity Tolerance  Patient tolerated treatment well    Behavior During Therapy  [facility] for tasks assessed/performed       Past Medical History:  Diagnosis Date  . Abnormal Pap smear of cervix   . Anxiety   . Kidney stones   . MS (multiple sclerosis) ([place])   . UTI (urinary tract infection)     Past Surgical History:  Procedure Laterality Date  . BREAST BIOPSY Right 2010   stereotactic biopsy/ neg/ dr.byrnett  . BREAST BIOPSY Left 2012   neg/dr. brynett  . CESAREAN SECTION  2008   RPH  . TONSILLECTOMY  2006    There were no vitals filed for this visit.  Subjective Assessment - 12/25/18 1724    Subjective  Patient reports she tripped three times today. Patient states no major changes otherwise.    Patient is accompained by:  Family member    Pertinent History  Patient reports history of falls x3 in the past 6 months secondary to drop foot on the L LE. PMH: Multiple sclerosis; elbow fracture of the R UE    Limitations  Standing;Walking    How long can you stand comfortably?  20 min    How long can you walk comfortably?  1 miles    Patient Stated Goals  To be more balanced and core strengthening     Currently in Pain?  No/denies    Pain Onset  More than a month ago       TREATMENT Ther ex Leg swings flexion/ext and lateral/medial to increase tissue perfusion and improve therapy tolerance Bounding 6 x 10 ft  for hip stability and power Running with focus on foot placement and lifting her feet in the air - x 35ft Skiers lateral hops onto her legs - x 20  Soccer kicks to L side to improve accuracy of L sided motion -- x 30  Ball toss with B LE on airex beam -- x 20   Performed exercise in standing to improve SLS balance, power, and endurance  PT Education - 12/25/18 1729    Education Details  form/technique with exercise    Person(s) Educated  Patient    Methods  Explanation;Demonstration    Comprehension  Verbalized understanding;Returned demonstration          PT Long Term Goals - 11/21/18 0906      PT LONG TERM GOAL #1   Title  Patient will be independent with balance HEP to continue benefits of therapy after discharge.    Baseline  dependent with form/technique for balance exercise; 03/25/2018: Independent     Time  6    Period  Weeks    Status  Achieved      PT LONG TERM GOAL #2   Title  Patient will improve FGA  by 4 points to indicate significant improvement with balancing while ambulating and decrease of fall risk    Baseline  03/25/2018: 28/30    Time  6    Period  Weeks    Status  Achieved      PT LONG TERM GOAL #3   Title  Patient will be able to perform prolonged single leg stance >10sec with eyes closed to indicate improvement with static balance and decrease fall risk    Baseline  2 sec in standing EC; 03/25/2018: 3 sec; 04/29/2018: 4 sec; 08/21/2018: 5 sec; 09/19/2018: 6 sec ; 10/21/2018 3 sec; 11/21/2018 5 sec    Time  4    Period  Weeks    Status  On-going      PT LONG TERM GOAL #4   Title  Pt will improve 6 MWT >2032ft to demonstrate improved cardiorespiratory fitness    Baseline  Deferred to next session: 04/29/2018; 05/01/2018: 1486ft; 08/21/2018: 1414ft; 09/19/2018: 1467ft; 10/21/2018 1621; 11/21/2018 deferred to next session 2/2 time.    Time  4    Period  Weeks    Status  On-going            Plan - 12/25/18 1902    Clinical Impression Statement  Conitnued  to focus on improving balance with dynamic activities such as kicking, throwing and running. Patient demosntrates difficulty with ability to perform stepping to stop a ball. Patient is improving with SLS although she continues to have difficulty with dynamic single leg stance with difficulty with running. Patient is improving overall and will benefit from further skilled therapy to return to prior level of function.    Personal Factors and Comorbidities  Past/Current Experience    Examination-Activity Limitations  Locomotion Level    Examination-Participation Restrictions  Community Activity    Stability/Clinical Decision Making  Stable/Uncomplicated    Rehab Potential  Good    Clinical Impairments Affecting Rehab Potential  (-) hx of MS; (+) good motivation    PT Frequency  2x / week    PT Duration  4 weeks    PT Treatment/Interventions  Moist Heat;Iontophoresis 4mg /ml Dexamethasone;Electrical Stimulation;Aquatic Therapy;Cryotherapy;Therapeutic activities;Neuromuscular re-education;Therapeutic exercise;Patient/family education;Dry needling;Manual techniques;Passive range of motion;Balance training;Functional mobility training;Gait training;Stair training;Joint Manipulations    PT Next Visit Plan  Continue addressing static (eyes closed), dynamic balance exercises, plyometric power movements, as well as incorporating more dual tasking and coordination activities.    PT Home Exercise Plan  See education    Consulted and Agree with Plan of Care  Patient       Patient will benefit from skilled therapeutic intervention in order to improve the following deficits and impairments:  Pain, Decreased coordination, Impaired perceived functional ability, Increased fascial restricitons, Increased muscle spasms, Difficulty walking, Abnormal gait, Decreased balance, Decreased mobility, Impaired sensation  Visit Diagnosis: Difficulty in walking, not elsewhere classified  Muscle weakness (generalized)  History  of falling     Problem List There are no active problems to display for this patient.   [name], PT DPT 12/25/2018, 7:22 PM  [place] [facility] [address] [place], [address] Phone: [phone]   Fax:  [phone]  Name: Michelle Ruiz MRN: [id number] Date of Birth: 1971/02/26

## 2018-12-30 ENCOUNTER — Ambulatory Visit: Payer: BC Managed Care – PPO | Attending: Neurology

## 2018-12-30 ENCOUNTER — Other Ambulatory Visit: Payer: Self-pay

## 2018-12-30 DIAGNOSIS — Z9181 History of falling: Secondary | ICD-10-CM | POA: Insufficient documentation

## 2018-12-30 DIAGNOSIS — M6281 Muscle weakness (generalized): Secondary | ICD-10-CM | POA: Diagnosis not present

## 2018-12-30 DIAGNOSIS — R262 Difficulty in walking, not elsewhere classified: Secondary | ICD-10-CM | POA: Insufficient documentation

## 2018-12-31 NOTE — Therapy (Signed)
[facility] [address], [place], [address] Phone: [phone]   Fax:  [phone]  Physical Therapy Treatment  Patient Details  Name: Michelle Ruiz MRN: [id number] Date of Birth: 1970/05/10 Referring Provider (PT): Darrin Luis MD   Encounter Date: 12/30/2018  PT End of Session - 12/30/18 1726    Visit Number  44    Number of Visits  60    Date for PT Re-Evaluation  01/02/19    Authorization Type  BCBS    PT Start Time  [id number]    PT Stop Time  1800    PT Time Calculation (min)  45 min    Activity Tolerance  Patient tolerated treatment well    Behavior During Therapy  [facility] for tasks assessed/performed       Past Medical History:  Diagnosis Date  . Abnormal Pap smear of cervix   . Anxiety   . Kidney stones   . MS (multiple sclerosis) ([place])   . UTI (urinary tract infection)     Past Surgical History:  Procedure Laterality Date  . BREAST BIOPSY Right 2010   stereotactic biopsy/ neg/ dr.byrnett  . BREAST BIOPSY Left 2012   neg/dr. brynett  . CESAREAN SECTION  2008   RPH  . TONSILLECTOMY  2006    There were no vitals filed for this visit.  Subjective Assessment - 12/30/18 1721    Subjective  Patient state she has had "a bad day" which she has been very stressed out all day and "in a terrible mood".    Patient is accompained by:  Family member    Pertinent History  Patient reports history of falls x3 in the past 6 months secondary to drop foot on the L LE. PMH: Multiple sclerosis; elbow fracture of the R UE    Limitations  Standing;Walking    How long can you stand comfortably?  20 min    How long can you walk comfortably?  1 miles    Patient Stated Goals  To be more balanced and core strengthening     Currently in Pain?  No/denies    Pain Onset  More than a month ago       TREATMENT Ther ex Leg swings flexion/ext and lateral/medial to increase tissue perfusion and improve therapy  tolerance Ball toss with B LE on airex beam -- x 20 Tandem walking across airex beam - x20 Feet together on large dynadisc - x 20 STS with 10# weight - 2 x 15 Tandem stance on large balance stones with weight shifts forward backward  Marches on large balance stones - x 20    Performed exercise in standing to improve SLS balance, power, and endurance   PT Education - 12/30/18 1725    Education Details  form/technique with exercise    Person(s) Educated  Patient    Methods  Explanation;Demonstration    Comprehension  Verbalized understanding;Returned demonstration          PT Long Term Goals - 11/21/18 0906      PT LONG TERM GOAL #1   Title  Patient will be independent with balance HEP to continue benefits of therapy after discharge.    Baseline  dependent with form/technique for balance exercise; 03/25/2018: Independent     Time  6    Period  Weeks    Status  Achieved      PT LONG TERM GOAL #2   Title  Patient will  improve FGA by 4 points to indicate significant improvement with balancing while ambulating and decrease of fall risk    Baseline  03/25/2018: 28/30    Time  6    Period  Weeks    Status  Achieved      PT LONG TERM GOAL #3   Title  Patient will be able to perform prolonged single leg stance >10sec with eyes closed to indicate improvement with static balance and decrease fall risk    Baseline  2 sec in standing EC; 03/25/2018: 3 sec; 04/29/2018: 4 sec; 08/21/2018: 5 sec; 09/19/2018: 6 sec ; 10/21/2018 3 sec; 11/21/2018 5 sec    Time  4    Period  Weeks    Status  On-going      PT LONG TERM GOAL #4   Title  Pt will improve 6 MWT >2080ft to demonstrate improved cardiorespiratory fitness    Baseline  Deferred to next session: 04/29/2018; 05/01/2018: 1462ft; 08/21/2018: 1462ft; 09/19/2018: 146ft; 10/21/2018 1621; 11/21/2018 deferred to next session 2/2 time.    Time  4    Period  Weeks    Status  On-going            Plan - 12/30/18 1806    Clinical Impression  Statement  Focused on improving balance with exercises today as patient demosntrates increased stress with higher level exercise. Patient demonstrates increased difficulty with tandem stance in compliant surfaces. COntinued to address equilibrium difficulties throughout the session and patient improves as patinet continues. Patient will benefit from further skilled therapy to return to prior level of function.    Personal Factors and Comorbidities  Past/Current Experience    Examination-Activity Limitations  Locomotion Level    Examination-Participation Restrictions  Community Activity    Stability/Clinical Decision Making  Stable/Uncomplicated    Rehab Potential  Good    Clinical Impairments Affecting Rehab Potential  (-) hx of MS; (+) good motivation    PT Frequency  2x / week    PT Duration  4 weeks    PT Treatment/Interventions  Moist Heat;Iontophoresis 4mg /ml Dexamethasone;Electrical Stimulation;Aquatic Therapy;Cryotherapy;Therapeutic activities;Neuromuscular re-education;Therapeutic exercise;Patient/family education;Dry needling;Manual techniques;Passive range of motion;Balance training;Functional mobility training;Gait training;Stair training;Joint Manipulations    PT Next Visit Plan  Continue addressing static (eyes closed), dynamic balance exercises, plyometric power movements, as well as incorporating more dual tasking and coordination activities.    PT Home Exercise Plan  See education    Consulted and Agree with Plan of Care  Patient       Patient will benefit from skilled therapeutic intervention in order to improve the following deficits and impairments:  Pain, Decreased coordination, Impaired perceived functional ability, Increased fascial restricitons, Increased muscle spasms, Difficulty walking, Abnormal gait, Decreased balance, Decreased mobility, Impaired sensation  Visit Diagnosis: Difficulty in walking, not elsewhere classified  Muscle weakness (generalized)     Problem  List There are no active problems to display for this patient.   [name], PT DPT 12/31/2018, 7:31 AM  [facility] [address] [place], [address] Phone: [phone]   Fax:  [phone]  Name: Michelle Ruiz MRN: [id number] Date of Birth: [birth year]

## 2019-01-01 ENCOUNTER — Other Ambulatory Visit: Payer: Self-pay

## 2019-01-01 ENCOUNTER — Ambulatory Visit: Payer: BC Managed Care – PPO

## 2019-01-01 DIAGNOSIS — M6281 Muscle weakness (generalized): Secondary | ICD-10-CM | POA: Diagnosis not present

## 2019-01-01 DIAGNOSIS — R262 Difficulty in walking, not elsewhere classified: Secondary | ICD-10-CM | POA: Diagnosis not present

## 2019-01-01 DIAGNOSIS — Z9181 History of falling: Secondary | ICD-10-CM | POA: Diagnosis not present

## 2019-01-02 NOTE — Therapy (Signed)
[facility] [address], [place], [address] Phone: [phone]   Fax:  [phone]  Physical Therapy Treatment  Patient Details  Name: Michelle Ruiz MRN: [id number] Date of Birth: 08-08-1970 Referring Provider (PT): Darrin Luis MD   Encounter Date: 01/01/2019  PT End of Session - 01/01/19 1759    Visit Number  33    Number of Visits  60    Date for PT Re-Evaluation  01/02/19    Authorization Type  BCBS    PT Start Time  [id number]    PT Stop Time  1800    PT Time Calculation (min)  45 min    Activity Tolerance  Patient tolerated treatment well    Behavior During Therapy  [facility] for tasks assessed/performed       Past Medical History:  Diagnosis Date  . Abnormal Pap smear of cervix   . Anxiety   . Kidney stones   . MS (multiple sclerosis) ([place])   . UTI (urinary tract infection)     Past Surgical History:  Procedure Laterality Date  . BREAST BIOPSY Right 2010   stereotactic biopsy/ neg/ dr.byrnett  . BREAST BIOPSY Left 2012   neg/dr. brynett  . CESAREAN SECTION  2008   RPH  . TONSILLECTOMY  2006    There were no vitals filed for this visit.  Subjective Assessment - 01/01/19 1730    Subjective  Patient reports she feels better than the other session. Patient states she exercised with arm weights during her biking. Patient reports she is very stressed currently and would like to go over ways to help this.    Patient is accompained by:  Family member    Pertinent History  Patient reports history of falls x3 in the past 6 months secondary to drop foot on the L LE. PMH: Multiple sclerosis; elbow fracture of the R UE    Limitations  Standing;Walking    How long can you stand comfortably?  20 min    How long can you walk comfortably?  1 miles    Patient Stated Goals  To be more balanced and core strengthening     Currently in Pain?  No/denies    Pain Onset  More than a month ago          TREATMENT Ther ex Leg swings flexion/ext and lateral/medial to increase tissue perfusion and improve therapy tolerance - 3 min Lunges in standing - x 20 Balance machine maze control - performed with static positioning - x1 Squats in standing - x 20   Neuromuscular Reeducation Progressive muscle relaxation - PMR x 15 min  Gradually performed contact - relax from UE and LEs throughout the session. Performed to decrease muscular tension throughout the body and to lower the protective responses experienced during exercises.  Educated patient on connection between exercise performance and stress. Educated patient on ways to decrease stress at home and at work to help improve control of body  Performed exercise in standing to improve muscular strength  PT Education - 01/01/19 1758    Education Details  form/technique with exercise    Person(s) Educated  Patient    Methods  Explanation;Demonstration    Comprehension  Verbalized understanding;Returned demonstration          PT Long Term Goals - 11/21/18 0906      PT LONG TERM GOAL #1   Title  Patient will be independent with balance HEP to continue benefits  of therapy after discharge.    Baseline  dependent with form/technique for balance exercise; 03/25/2018: Independent     Time  6    Period  Weeks    Status  Achieved      PT LONG TERM GOAL #2   Title  Patient will improve FGA by 4 points to indicate significant improvement with balancing while ambulating and decrease of fall risk    Baseline  03/25/2018: 28/30    Time  6    Period  Weeks    Status  Achieved      PT LONG TERM GOAL #3   Title  Patient will be able to perform prolonged single leg stance >10sec with eyes closed to indicate improvement with static balance and decrease fall risk    Baseline  2 sec in standing EC; 03/25/2018: 3 sec; 04/29/2018: 4 sec; 08/21/2018: 5 sec; 09/19/2018: 6 sec ; 10/21/2018 3 sec; 11/21/2018 5 sec    Time  4    Period  Weeks    Status   On-going      PT LONG TERM GOAL #4   Title  Pt will improve 6 MWT >2058ft to demonstrate improved cardiorespiratory fitness    Baseline  Deferred to next session: 04/29/2018; 05/01/2018: 147ft; 08/21/2018: 141ft; 09/19/2018: 1461ft; 10/21/2018 1621; 11/21/2018 deferred to next session 2/2 time.    Time  4    Period  Weeks    Status  On-going            Plan - 01/02/19 1612    Clinical Impression Statement  Patient demonstrates improvement in symptoms with performing progressive muscle relaxation, indicating decrease in anxiety and improvement in desensitization of her nervous system. Performed exercises to improve strength with activities and patient will benefit from [facility] skilled therapy to return to prior level of function.    Personal Factors and Comorbidities  Past/Current Experience    Examination-Activity Limitations  Locomotion Level    Examination-Participation Restrictions  Community Activity    Stability/Clinical Decision Making  Stable/Uncomplicated    Rehab Potential  Good    Clinical Impairments Affecting Rehab Potential  (-) hx of MS; (+) good motivation    PT Frequency  2x / week    PT Duration  4 weeks    PT Treatment/Interventions  Moist Heat;Iontophoresis 4mg /ml Dexamethasone;Electrical Stimulation;Aquatic Therapy;Cryotherapy;Therapeutic activities;Neuromuscular re-education;Therapeutic exercise;Patient/family education;Dry needling;Manual techniques;Passive range of motion;Balance training;Functional mobility training;Gait training;Stair training;Joint Manipulations    PT Next Visit Plan  Continue addressing static (eyes closed), dynamic balance exercises, plyometric power movements, as well as incorporating more dual tasking and coordination activities.    PT Home Exercise Plan  See education    Consulted and Agree with Plan of Care  Patient       Patient will benefit from skilled therapeutic intervention in order to improve the following deficits and impairments:   Pain, Decreased coordination, Impaired perceived functional ability, Increased fascial restricitons, Increased muscle spasms, Difficulty walking, Abnormal gait, Decreased balance, Decreased mobility, Impaired sensation  Visit Diagnosis: Difficulty in walking, not elsewhere classified  Muscle weakness (generalized)     Problem List There are no active problems to display for this patient.   [name], PT DPT 01/02/2019, 4:16 PM  [facility] [address], [place], [address] Phone: [phone]   Fax:  [phone]  Name: Michelle Ruiz MRN: [id number] Date of Birth: [birth year]

## 2019-01-06 ENCOUNTER — Ambulatory Visit: Payer: BC Managed Care – PPO

## 2019-01-06 ENCOUNTER — Other Ambulatory Visit: Payer: Self-pay

## 2019-01-06 DIAGNOSIS — Z9181 History of falling: Secondary | ICD-10-CM | POA: Diagnosis not present

## 2019-01-06 DIAGNOSIS — R262 Difficulty in walking, not elsewhere classified: Secondary | ICD-10-CM

## 2019-01-06 DIAGNOSIS — M6281 Muscle weakness (generalized): Secondary | ICD-10-CM

## 2019-01-06 NOTE — Therapy (Signed)
[facility] [address], [place], [address] Phone: [phone]   Fax:  [phone]  Physical Therapy Treatment  Patient Details  Name: Michelle Ruiz MRN: [id number] Date of Birth: 18-May-1970 Referring Provider (PT): Darrin Luis MD   Encounter Date: 01/06/2019  PT End of Session - 01/06/19 1717    Visit Number  59    Number of Visits  60    Date for PT Re-Evaluation  01/02/19    Authorization Type  BCBS    PT Start Time  [id number]    PT Stop Time  1553    PT Time Calculation (min)  40 min    Activity Tolerance  Patient tolerated treatment well;No increased pain;Patient limited by fatigue    Behavior During Therapy  [facility] for tasks assessed/performed       Past Medical History:  Diagnosis Date  . Abnormal Pap smear of cervix   . Anxiety   . Kidney stones   . MS (multiple sclerosis) ([place])   . UTI (urinary tract infection)     Past Surgical History:  Procedure Laterality Date  . BREAST BIOPSY Right 2010   stereotactic biopsy/ neg/ dr.byrnett  . BREAST BIOPSY Left 2012   neg/dr. brynett  . CESAREAN SECTION  2008   RPH  . TONSILLECTOMY  2006    There were no vitals filed for this visit.  Subjective Assessment - 01/06/19 1714    Subjective  Pt says she is doing well today felt good after leaving her last session. Pt reports she has not been doing HEP much recently, but for no clear reason. Pt reports tripping going into the bathroom, but fell into the wall without much injury.    Pertinent History  Patient reports history of falls x3 in the past 6 months secondary to drop foot on the L LE. PMH: Multiple sclerosis; elbow fracture of the R UE    Currently in Pain?  No/denies       TREATMENT Leg swings flexion/ext and lateral/medial to increase tissue perfusion and improve therapy tolerance Narrow Stance Foam Self ball toss x20 Narrow Stance Foam Overhead rebound x20 Narrow Stance 90 degree trunk  rotation overhead rebound 12x each side  Marching on Airex 20x total STS with 10# weight - 3x10 Tandem walking across airex beam - x20      PT Long Term Goals - 11/21/18 0906      PT LONG TERM GOAL #1   Title  Patient will be independent with balance HEP to continue benefits of therapy after discharge.    Baseline  dependent with form/technique for balance exercise; 03/25/2018: Independent     Time  6    Period  Weeks    Status  Achieved      PT LONG TERM GOAL #2   Title  Patient will improve FGA by 4 points to indicate significant improvement with balancing while ambulating and decrease of fall risk    Baseline  03/25/2018: 28/30    Time  6    Period  Weeks    Status  Achieved      PT LONG TERM GOAL #3   Title  Patient will be able to perform prolonged single leg stance >10sec with eyes closed to indicate improvement with static balance and decrease fall risk    Baseline  2 sec in standing EC; 03/25/2018: 3 sec; 04/29/2018: 4 sec; 08/21/2018: 5 sec; 09/19/2018: 6 sec ; 10/21/2018 3 sec;  11/21/2018 5 sec    Time  4    Period  Weeks    Status  On-going      PT LONG TERM GOAL #4   Title  Pt will improve 6 MWT >2034ft to demonstrate improved cardiorespiratory fitness    Baseline  Deferred to next session: 04/29/2018; 05/01/2018: 1428ft; 08/21/2018: 1454ft; 09/19/2018: 1476ft; 10/21/2018 1621; 11/21/2018 deferred to next session 2/2 time.    Time  4    Period  Weeks    Status  On-going            Plan - 01/06/19 1717    Clinical Impression Statement  In general, patient demonstrating good tolerance to therapy session this date, reasonable accommodations are alllowed in-session to allow adequate rest between activities as needed. Pt especially overworked with full body balance actiivy, pt sweating and with increased RR. All interventional executed without any exacerbation of pain or other symptoms. Pt demonstrates focused motivation to fully participatein session. Pt is able to  progress in certain interventions, although strength deficits remain obvious to author, as well as easy fatiguability.  Pt given intermittent multimodal cues to teach best possible form with exercises. Pt continues to make steady progress toward most goals. No home exercise updates made at this time.    Stability/Clinical Decision Making  Stable/Uncomplicated    Rehab Potential  Good    PT Frequency  2x / week    PT Duration  4 weeks    PT Treatment/Interventions  Moist Heat;Iontophoresis 4mg /ml Dexamethasone;Electrical Stimulation;Aquatic Therapy;Cryotherapy;Therapeutic activities;Neuromuscular re-education;Therapeutic exercise;Patient/family education;Dry needling;Manual techniques;Passive range of motion;Balance training;Functional mobility training;Gait training;Stair training;Joint Manipulations    PT Next Visit Plan  Continue addressing static (eyes closed), dynamic balance exercises, plyometric power movements, as well as incorporating more dual tasking and coordination activities.    PT Home Exercise Plan  No updates this date.    Consulted and Agree with Plan of Care  Patient       Patient will benefit from skilled therapeutic intervention in order to improve the following deficits and impairments:  Pain, Decreased coordination, Impaired perceived functional ability, Increased fascial restricitons, Increased muscle spasms, Difficulty walking, Abnormal gait, Decreased balance, Decreased mobility, Impaired sensation  Visit Diagnosis: Difficulty in walking, not elsewhere classified  Muscle weakness (generalized)  History of falling     Problem List There are no active problems to display for this patient.  5:27 PM, 01/06/19 Etta Grandchild, PT, DPT Physical Therapist - [place] [phone] (Office)    Kristina Bertone C 01/06/2019, 5:23 PM  [facility] [address], [place], [address] Phone:  [phone]   Fax:  [phone]  Name: Michelle Ruiz MRN: [id number] Date of Birth: 1970-07-16

## 2019-01-08 ENCOUNTER — Other Ambulatory Visit: Payer: Self-pay

## 2019-01-08 ENCOUNTER — Ambulatory Visit: Payer: BC Managed Care – PPO

## 2019-01-08 DIAGNOSIS — Z9181 History of falling: Secondary | ICD-10-CM

## 2019-01-08 DIAGNOSIS — M6281 Muscle weakness (generalized): Secondary | ICD-10-CM | POA: Diagnosis not present

## 2019-01-08 DIAGNOSIS — R262 Difficulty in walking, not elsewhere classified: Secondary | ICD-10-CM | POA: Diagnosis not present

## 2019-01-08 NOTE — Therapy (Signed)
[place] [facility] [address], [place], [address] Phone: [phone]   Fax:  [phone]  Physical Therapy Treatment  Patient Details  Name: Michelle Ruiz MRN: [id number] Date of Birth: [birth year] Referring Provider (PT): Darrin Luis MD   Encounter Date: 01/08/2019  PT End of Session - 01/08/19 1727    Visit Number  60    Number of Visits  60    Date for PT Re-Evaluation  01/02/19    Authorization Type  BCBS    PT Start Time  [id number]    PT Stop Time  1600    PT Time Calculation (min)  45 min    Activity Tolerance  Patient tolerated treatment well;No increased pain;Patient limited by fatigue    Behavior During Therapy  [facility] for tasks assessed/performed       Past Medical History:  Diagnosis Date  . Abnormal Pap smear of cervix   . Anxiety   . Kidney stones   . MS (multiple sclerosis) ([place])   . UTI (urinary tract infection)     Past Surgical History:  Procedure Laterality Date  . BREAST BIOPSY Right 2010   stereotactic biopsy/ neg/ dr.byrnett  . BREAST BIOPSY Left 2012   neg/dr. brynett  . CESAREAN SECTION  2008   RPH  . TONSILLECTOMY  2006    There were no vitals filed for this visit.  Subjective Assessment - 01/08/19 1718    Subjective  Patient states she has not tried PMR since the previous session. Patient reports she has difficulty with balancing on one leg, running, and walking >34minutes.    Pertinent History  Patient reports history of falls x3 in the past 6 months secondary to drop foot on the L LE. PMH: Multiple sclerosis; elbow fracture of the R UE    Currently in Pain?  No/denies       TREATMENT  Therapeutic Exercise:  Running with focus on improving hip flexion with activity - x 39ft Ambulating with focus on improving speed - 1577ft Single leg stance standing in Doorway EC - 2 x 30sec Leg swings in standing - x 30 forward/backward/laterally Calf stretch in standing with foot  against elevated surface - x 20 Dorsiflexion in sitting with legs straight - x 5 with 10 sec holds  Performed exercises in standing to improve ankle mobility and improve strength.   PT Long Term Goals - 01/08/19 1743      PT LONG TERM GOAL #1   Title  Patient will be independent with balance HEP to continue benefits of therapy after discharge.    Baseline  dependent with form/technique for balance exercise; 03/25/2018: Independent     Time  6    Period  Weeks    Status  Achieved      PT LONG TERM GOAL #2   Title  Patient will improve FGA by 4 points to indicate significant improvement with balancing while ambulating and decrease of fall risk    Baseline  03/25/2018: 28/30    Time  6    Period  Weeks    Status  Achieved      PT LONG TERM GOAL #3   Title  Patient will be able to perform prolonged single leg stance >10sec with eyes closed to indicate improvement with static balance and decrease fall risk    Baseline  2 sec in standing EC; 03/25/2018: 3 sec; 04/29/2018: 4 sec; 08/21/2018: 5 sec; 09/19/2018: 6 sec ; 10/21/2018  3 sec; 11/21/2018 5 sec; 01/08/2019: 9 sec    Time  4    Period  Weeks    Status  On-going      PT LONG TERM GOAL #4   Title  Pt will improve 6 MWT >2076ft to demonstrate improved cardiorespiratory fitness    Baseline  Deferred to next session: 04/29/2018; 05/01/2018: 1451ft; 08/21/2018: 1437ft; 09/19/2018: 1458ft; 10/21/2018 1621; 11/21/2018 deferred to next session 2/2 time.; 01/08/2019: 1480 ft    Time  4    Period  Weeks    Status  On-going      PT LONG TERM GOAL #5   Title  Patient will be able to run 250ft without stopping to better be able to return to recreational activites and address cardiovascular endurance.    Baseline  75ft    Time  4    Period  Weeks    Status  New            Plan - 01/08/19 1804    Clinical Impression Statement  Patient is making progress towards long term goals with ability to stand for a longer period of time most notably with  EC indicating improvement in static balance. However, patient has a decrease in 54minWT but this is not secondary to cardiovascular fatigue, but to increased clonus along the L LE. Patient continues to fatigue easily with higher level exercises (running and hopping). Patient is improving overall and will continue to address limitations in dorsiflexion and performing prolonged running. Patient will benefit from further skilled therapy to return to prior level of function.    Stability/Clinical Decision Making  Stable/Uncomplicated    Rehab Potential  Good    PT Frequency  2x / week    PT Duration  4 weeks    PT Treatment/Interventions  Moist Heat;Iontophoresis 4mg /ml Dexamethasone;Electrical Stimulation;Aquatic Therapy;Cryotherapy;Therapeutic activities;Neuromuscular re-education;Therapeutic exercise;Patient/family education;Dry needling;Manual techniques;Passive range of motion;Balance training;Functional mobility training;Gait training;Stair training;Joint Manipulations    PT Next Visit Plan  Continue addressing static (eyes closed), dynamic balance exercises, plyometric power movements, as well as incorporating more dual tasking and coordination activities.    PT Home Exercise Plan  No updates this date.    Consulted and Agree with Plan of Care  Patient       Patient will benefit from skilled therapeutic intervention in order to improve the following deficits and impairments:  Pain, Decreased coordination, Impaired perceived functional ability, Increased fascial restricitons, Increased muscle spasms, Difficulty walking, Abnormal gait, Decreased balance, Decreased mobility, Impaired sensation  Visit Diagnosis: Difficulty in walking, not elsewhere classified  Muscle weakness (generalized)  History of falling     Problem List There are no active problems to display for this patient.   [name], PT DPT 01/08/2019, 6:18 PM  [place] [facility] [address] [place], [address] Phone: [phone]   Fax:  [phone]  Name: JACKLINE HANLIN MRN: [id number] Date of Birth: November 14, 1970

## 2019-01-09 DIAGNOSIS — F3281 Premenstrual dysphoric disorder: Secondary | ICD-10-CM | POA: Diagnosis not present

## 2019-01-09 DIAGNOSIS — F411 Generalized anxiety disorder: Secondary | ICD-10-CM | POA: Diagnosis not present

## 2019-01-09 DIAGNOSIS — F330 Major depressive disorder, recurrent, mild: Secondary | ICD-10-CM | POA: Diagnosis not present

## 2019-01-13 ENCOUNTER — Other Ambulatory Visit: Payer: Self-pay

## 2019-01-13 ENCOUNTER — Ambulatory Visit: Payer: BC Managed Care – PPO

## 2019-01-13 DIAGNOSIS — R262 Difficulty in walking, not elsewhere classified: Secondary | ICD-10-CM | POA: Diagnosis not present

## 2019-01-13 DIAGNOSIS — M6281 Muscle weakness (generalized): Secondary | ICD-10-CM

## 2019-01-13 DIAGNOSIS — Z9181 History of falling: Secondary | ICD-10-CM

## 2019-01-13 NOTE — Therapy (Signed)
[place] [facility] [address] [place], [address] Phone: [phone]   Fax:  [phone]  Physical Therapy Treatment  Patient Details  Name: Michelle Ruiz MRN: [id number] Date of Birth: [birth year] Referring Provider (PT): Darrin Luis MD   Encounter Date: 01/13/2019  PT End of Session - 01/13/19 1757    Visit Number  61    Number of Visits  60    Authorization Type  BCBS    PT Start Time  [id number]    PT Stop Time  [id number]    PT Time Calculation (min)  41 min    Activity Tolerance  Patient tolerated treatment well;No increased pain;Patient limited by fatigue    Behavior During Therapy  [facility] for tasks assessed/performed       Past Medical History:  Diagnosis Date  . Abnormal Pap smear of cervix   . Anxiety   . Kidney stones   . MS (multiple sclerosis) ([place])   . UTI (urinary tract infection)     Past Surgical History:  Procedure Laterality Date  . BREAST BIOPSY Right 2010   stereotactic biopsy/ neg/ dr.byrnett  . BREAST BIOPSY Left 2012   neg/dr. brynett  . CESAREAN SECTION  2008   RPH  . TONSILLECTOMY  2006    There were no vitals filed for this visit.  Subjective Assessment - 01/13/19 1724    Subjective  Patient stated that she is doing well today. Pt has not attempted PMR since previous session.    Patient is accompained by:  Family member    Pertinent History  Patient reports history of falls x3 in the past 6 months secondary to drop foot on the L LE. PMH: Multiple sclerosis; elbow fracture of the R UE    Limitations  Standing;Walking    Currently in Pain?  No/denies         TREATMENT  Therapeutic Exercise:  Treadmill walking 2.0 MPH 4:40sec, 2.4 MPH 46mins, constant monitoring throughout, cues to decrease UE support Running man ambulation (step with exaggerated hip flexion/knee flexion, pause at the top, then switch legs) x5 laps in hallway, cues for glute activation, upright posture CGA-minA  throughout for safety, constant LOB noted, increased with fatigue Standing hip flexion knee flexion without UE support, x10 bilaterally, noticeable strength difference in L hip flexor (weaker) Lap outside with progressively faster ambulation > 1081ft on uneven surfaces, grass, inclines, declines Standing balance ball toss feet togeteher, CGA for safety when needed Single leg stance bilaterally standing in Doorway  EC - 2 x 30sec Leg swings in standing - x 30 forward/backward/laterally Standing tosses in near tandem stance bilaterally x10 ea, with 4 lb medicine ball, supervision-CGA for safety.   Pt response/clinical impression: The patient reported fatigue during session, needed 2 seated rest breaks. Session focused on promoting balance and activity tolerance/endurance. Pt most challenged by single leg activities and increased gait speed. Overall the patient would benefit from further skilled PT intervention to continue to progress towards goals.    PT Education - 01/13/19 1744    Education Details  HEP, exercise form/technique, balance strategies    Person(s) Educated  Patient    Methods  Demonstration;Explanation;Tactile cues;Verbal cues    Comprehension  Verbalized understanding;Returned demonstration          PT Long Term Goals - 01/08/19 1743      PT LONG TERM GOAL #1   Title  Patient will be independent with balance HEP to continue benefits  of therapy after discharge.    Baseline  dependent with form/technique for balance exercise; 03/25/2018: Independent     Time  6    Period  Weeks    Status  Achieved      PT LONG TERM GOAL #2   Title  Patient will improve FGA by 4 points to indicate significant improvement with balancing while ambulating and decrease of fall risk    Baseline  03/25/2018: 28/30    Time  6    Period  Weeks    Status  Achieved      PT LONG TERM GOAL #3   Title  Patient will be able to perform prolonged single leg stance >10sec with eyes closed to  indicate improvement with static balance and decrease fall risk    Baseline  2 sec in standing EC; 03/25/2018: 3 sec; 04/29/2018: 4 sec; 08/21/2018: 5 sec; 09/19/2018: 6 sec ; 10/21/2018 3 sec; 11/21/2018 5 sec; 01/08/2019: 9 sec    Time  4    Period  Weeks    Status  On-going      PT LONG TERM GOAL #4   Title  Pt will improve 6 MWT >2067ft to demonstrate improved cardiorespiratory fitness    Baseline  Deferred to next session: 04/29/2018; 05/01/2018: 1452ft; 08/21/2018: 1470ft; 09/19/2018: 1462ft; 10/21/2018 1621; 11/21/2018 deferred to next session 2/2 time.; 01/08/2019: 1480 ft    Time  4    Period  Weeks    Status  On-going      PT LONG TERM GOAL #5   Title  Patient will be able to run 228ft without stopping to better be able to return to recreational activites and address cardiovascular endurance.    Baseline  15ft    Time  4    Period  Weeks    Status  New            Plan - 01/14/19 1319    Clinical Impression Statement  The patient reported fatigue during session, needed 2 seated rest breaks. Session focused on promoting balance and activity tolerance/endurance. Pt most challenged by single leg activities and increased gait speed. Overall the patient would benefit from further skilled PT intervention to continue to progress towards goals.    Personal Factors and Comorbidities  Past/Current Experience    Examination-Activity Limitations  Locomotion Level    Examination-Participation Restrictions  Community Activity    Stability/Clinical Decision Making  Stable/Uncomplicated    Rehab Potential  Good    Clinical Impairments Affecting Rehab Potential  (-) hx of MS; (+) good motivation    PT Frequency  2x / week    PT Duration  4 weeks    PT Treatment/Interventions  Moist Heat;Iontophoresis 4mg /ml Dexamethasone;Electrical Stimulation;Aquatic Therapy;Cryotherapy;Therapeutic activities;Neuromuscular re-education;Therapeutic exercise;Patient/family education;Dry needling;Manual  techniques;Passive range of motion;Balance training;Functional mobility training;Gait training;Stair training;Joint Manipulations    PT Next Visit Plan  Continue addressing static (eyes closed), dynamic balance exercises, plyometric power movements, as well as incorporating more dual tasking and coordination activities.    PT Home Exercise Plan  hip flexion in sitting for strengthening, hip flexion knee flexion in standing for balance    Consulted and Agree with Plan of Care  Patient       Patient will benefit from skilled therapeutic intervention in order to improve the following deficits and impairments:  Pain, Decreased coordination, Impaired perceived functional ability, Increased fascial restricitons, Increased muscle spasms, Difficulty walking, Abnormal gait, Decreased balance, Decreased mobility, Impaired sensation  Visit Diagnosis: Difficulty in walking,  not elsewhere classified  Muscle weakness (generalized)  History of falling     Problem List There are no active problems to display for this patient.   Lieutenant Diego PT, DPT 1:21 PM,01/14/19 [place] [facility] [address], [place], [address] Phone: [phone]   Fax:  [phone]  Name: AIVAH BANICKI MRN: [id number] Date of Birth: [birth year]

## 2019-01-15 ENCOUNTER — Ambulatory Visit: Payer: BC Managed Care – PPO

## 2019-01-15 ENCOUNTER — Other Ambulatory Visit: Payer: Self-pay

## 2019-01-15 DIAGNOSIS — Z9181 History of falling: Secondary | ICD-10-CM | POA: Diagnosis not present

## 2019-01-15 DIAGNOSIS — M6281 Muscle weakness (generalized): Secondary | ICD-10-CM | POA: Diagnosis not present

## 2019-01-15 DIAGNOSIS — R262 Difficulty in walking, not elsewhere classified: Secondary | ICD-10-CM

## 2019-01-15 NOTE — Therapy (Signed)
[place] [facility] [address] [place], [address] Phone: [phone]   Fax:  [phone]  Physical Therapy Treatment  Patient Details  Name: Michelle Ruiz MRN: [id number] Date of Birth: [birth year] Referring Provider (PT): Darrin Luis MD   Encounter Date: 01/15/2019  PT End of Session - 01/15/19 1750    Visit Number  9    Number of Visits  60    Authorization Type  BCBS    PT Start Time  [id number]    PT Stop Time  [id number]    PT Time Calculation (min)  40 min    Activity Tolerance  Patient tolerated treatment well;No increased pain;Patient limited by fatigue    Behavior During Therapy  [facility] for tasks assessed/performed       Past Medical History:  Diagnosis Date  . Abnormal Pap smear of cervix   . Anxiety   . Kidney stones   . MS (multiple sclerosis) ([place])   . UTI (urinary tract infection)     Past Surgical History:  Procedure Laterality Date  . BREAST BIOPSY Right 2010   stereotactic biopsy/ neg/ dr.byrnett  . BREAST BIOPSY Left 2012   neg/dr. brynett  . CESAREAN SECTION  2008   RPH  . TONSILLECTOMY  2006    There were no vitals filed for this visit.  Subjective Assessment - 01/15/19 1722    Subjective  Patient reports no changes since last session with moderately stressful work today.    Patient is accompained by:  Family member    Pertinent History  Patient reports history of falls x3 in the past 6 months secondary to drop foot on the L LE. PMH: Multiple sclerosis; elbow fracture of the R UE    Limitations  Standing;Walking    Currently in Pain?  No/denies    Multiple Pain Sites  No         TREATMENT  TE  Hip swings lateral flexion extension with UE support to increase tissue perfusion and therapy tolerance SLS L R 3 x 30 sec each Tempo walks 112 -> 127 bpm x 500 ft  Heel raises on step for full ROM x 10 Alfredson heel raises x 8 Heel raises with 20 deg. Knee flexion to target soleus x  20  Calf stretches gastroc soleus x 1 min ea  Donkey kicks R x 8; L 3 x 8 - increased activation R glutes > L with faded therapist cues tactile and verbal for isolating hip extensors Fire hydrants R x 8 L 2 x 8 - tactile verbal cues for hip abducton L>R Quadruped knee to elbow R x 8 L 2 x 8 - tactile   Extended rest period with ice  TE for improved extensor strength and power with ambulation for improved energy conservation, neuromotor drive       PT Education - 01/15/19 1723    Education Details  exercise form/technique    Person(s) Educated  Patient    Methods  Explanation;Demonstration;Tactile cues;Verbal cues    Comprehension  Verbalized understanding;Returned demonstration;Verbal cues required;Tactile cues required          PT Long Term Goals - 01/08/19 1743      PT LONG TERM GOAL #1   Title  Patient will be independent with balance HEP to continue benefits of therapy after discharge.    Baseline  dependent with form/technique for balance exercise; 03/25/2018: Independent     Time  6    Period  Weeks    Status  Achieved      PT LONG TERM GOAL #2   Title  Patient will improve FGA by 4 points to indicate significant improvement with balancing while ambulating and decrease of fall risk    Baseline  03/25/2018: 28/30    Time  6    Period  Weeks    Status  Achieved      PT LONG TERM GOAL #3   Title  Patient will be able to perform prolonged single leg stance >10sec with eyes closed to indicate improvement with static balance and decrease fall risk    Baseline  2 sec in standing EC; 03/25/2018: 3 sec; 04/29/2018: 4 sec; 08/21/2018: 5 sec; 09/19/2018: 6 sec ; 10/21/2018 3 sec; 11/21/2018 5 sec; 01/08/2019: 9 sec    Time  4    Period  Weeks    Status  On-going      PT LONG TERM GOAL #4   Title  Pt will improve 6 MWT >2081ft to demonstrate improved cardiorespiratory fitness    Baseline  Deferred to next session: 04/29/2018; 05/01/2018: 1443ft; 08/21/2018: 1414ft; 09/19/2018:  1428ft; 10/21/2018 1621; 11/21/2018 deferred to next session 2/2 time.; 01/08/2019: 1480 ft    Time  4    Period  Weeks    Status  On-going      PT LONG TERM GOAL #5   Title  Patient will be able to run 247ft without stopping to better be able to return to recreational activites and address cardiovascular endurance.    Baseline  34ft    Time  4    Period  Weeks    Status  New            Plan - 01/15/19 1815    Clinical Impression Statement  Patient demonstrates good tolerance with challenging extension-based therapeutic exercise program. Significantly increased motor recruitment and isolation with exercises on R compared to L suggestive of neuromotor deficits on LLE. Patient demonstrates improvement in LLE SLS activities and calf strength. Patient will benefit from skilled PT intervention to continue to progress towards goals    Personal Factors and Comorbidities  Past/Current Experience    Examination-Activity Limitations  Locomotion Level    Examination-Participation Restrictions  Community Activity    Stability/Clinical Decision Making  Stable/Uncomplicated    Rehab Potential  Good    Clinical Impairments Affecting Rehab Potential  (-) hx of MS; (+) good motivation    PT Frequency  2x / week    PT Duration  4 weeks    PT Treatment/Interventions  Moist Heat;Iontophoresis 4mg /ml Dexamethasone;Electrical Stimulation;Aquatic Therapy;Cryotherapy;Therapeutic activities;Neuromuscular re-education;Therapeutic exercise;Patient/family education;Dry needling;Manual techniques;Passive range of motion;Balance training;Functional mobility training;Gait training;Stair training;Joint Manipulations    PT Next Visit Plan  Continue addressing static (eyes closed), dynamic balance exercises, plyometric power movements, as well as incorporating more dual tasking and coordination activities; update HEP    PT Home Exercise Plan  hip flexion in sitting for strengthening, hip flexion knee flexion in standing  for balance    Consulted and Agree with Plan of Care  Patient       Patient will benefit from skilled therapeutic intervention in order to improve the following deficits and impairments:  Pain, Decreased coordination, Impaired perceived functional ability, Increased fascial restricitons, Increased muscle spasms, Difficulty walking, Abnormal gait, Decreased balance, Decreased mobility, Impaired sensation  Visit Diagnosis: Difficulty in walking, not elsewhere classified  Muscle weakness (generalized)  History of falling     Problem List There are no active  problems to display for this patient.   [name], SPT 01/15/2019, 6:18 PM  [place] [facility] [address], [place], [address] Phone: [phone]   Fax:  [phone]  Name: Michelle Ruiz MRN: [id number] Date of Birth: 09-09-1970

## 2019-01-15 NOTE — Addendum Note (Signed)
Addended by: Blain Pais on: 01/15/2019 05:32 PM   Modules accepted: Orders

## 2019-01-20 ENCOUNTER — Other Ambulatory Visit: Payer: Self-pay

## 2019-01-20 ENCOUNTER — Ambulatory Visit: Payer: BC Managed Care – PPO

## 2019-01-20 DIAGNOSIS — Z9181 History of falling: Secondary | ICD-10-CM

## 2019-01-20 DIAGNOSIS — M6281 Muscle weakness (generalized): Secondary | ICD-10-CM

## 2019-01-20 DIAGNOSIS — R262 Difficulty in walking, not elsewhere classified: Secondary | ICD-10-CM | POA: Diagnosis not present

## 2019-01-21 NOTE — Therapy (Signed)
[facility] [place] [facility] [address], [place], [address] Phone: [phone]   Fax:  [phone]  Physical Therapy Treatment  Patient Details  Name: Michelle Ruiz MRN: [id number] Date of Birth: 03/19/1971 Referring Provider (PT): Darrin Luis MD   Encounter Date: 01/20/2019  PT End of Session - 01/20/19 1732    Visit Number  63    Number of Visits  86    Date for PT Re-Evaluation  02/19/19    Authorization Type  BCBS    PT Start Time  [id number]    PT Stop Time  1800    PT Time Calculation (min)  45 min    Activity Tolerance  Patient tolerated treatment well;No increased pain;Patient limited by fatigue    Behavior During Therapy  [facility] for tasks assessed/performed       Past Medical History:  Diagnosis Date  . Abnormal Pap smear of cervix   . Anxiety   . Kidney stones   . MS (multiple sclerosis) ([place])   . UTI (urinary tract infection)     Past Surgical History:  Procedure Laterality Date  . BREAST BIOPSY Right 2010   stereotactic biopsy/ neg/ dr.byrnett  . BREAST BIOPSY Left 2012   neg/dr. brynett  . CESAREAN SECTION  2008   RPH  . TONSILLECTOMY  2006    There were no vitals filed for this visit.  Subjective Assessment - 01/20/19 1729    Subjective  Patient reports she was sore along the calf and hamstring after the previous session. Patient states the tightness would come on "all of a sudden".    Patient is accompained by:  Family member    Pertinent History  Patient reports history of falls x3 in the past 6 months secondary to drop foot on the L LE. PMH: Multiple sclerosis; elbow fracture of the R UE    Limitations  Standing;Walking    Currently in Pain?  No/denies           TREATMENT  Therapeutic Exercise: Single leg stance standing - ball toss - x 20 with rebounder 4# Leg swings in standing - x 30 forward/backward/laterally Crab walks lateral band walks with GTB around - x 20  Squats in standing  with UE support - x 15  Ambulation over uneven surfaces inclines and declines to assess dorsiflexion/plantarflexion --1034ft Donkey kicks in standing without resistance - x 10 (forward lean onto bar performing hip extension) Standing on airex pad with ball toss - x 20    Performed exercises in standing to improve ankle mobility and improve strength.   PT Education - 01/20/19 1731    Education Details  form/technique with exercise    Person(s) Educated  Patient    Methods  Explanation;Demonstration    Comprehension  Verbalized understanding;Returned demonstration          PT Long Term Goals - 01/08/19 1743      PT LONG TERM GOAL #1   Title  Patient will be independent with balance HEP to continue benefits of therapy after discharge.    Baseline  dependent with form/technique for balance exercise; 03/25/2018: Independent     Time  6    Period  Weeks    Status  Achieved      PT LONG TERM GOAL #2   Title  Patient will improve FGA by 4 points to indicate significant improvement with balancing while ambulating and decrease of fall risk    Baseline  03/25/2018:  28/30    Time  6    Period  Weeks    Status  Achieved      PT LONG TERM GOAL #3   Title  Patient will be able to perform prolonged single leg stance >10sec with eyes closed to indicate improvement with static balance and decrease fall risk    Baseline  2 sec in standing EC; 03/25/2018: 3 sec; 04/29/2018: 4 sec; 08/21/2018: 5 sec; 09/19/2018: 6 sec ; 10/21/2018 3 sec; 11/21/2018 5 sec; 01/08/2019: 9 sec    Time  4    Period  Weeks    Status  On-going      PT LONG TERM GOAL #4   Title  Pt will improve 6 MWT >2059ft to demonstrate improved cardiorespiratory fitness    Baseline  Deferred to next session: 04/29/2018; 05/01/2018: 1426ft; 08/21/2018: 1464ft; 09/19/2018: 1454ft; 10/21/2018 1621; 11/21/2018 deferred to next session 2/2 time.; 01/08/2019: 1480 ft    Time  4    Period  Weeks    Status  On-going      PT LONG TERM GOAL #5    Title  Patient will be able to run 23ft without stopping to better be able to return to recreational activites and address cardiovascular endurance.    Baseline  16ft    Time  4    Period  Weeks    Status  New            Plan - 01/20/19 1744    Clinical Impression Statement  Patient demonstrates increased difficulty with ambulating over grass and over uneven surfaces. Patient demonstrates increased difficulty with performing dorsiflexion and eccentric loading control most notably with ambulating downhill. Patient demonstrates increased weakness with muscles of the posterior chain indicated by increased difficulty with performing hip extension in standing. Patient is improving overall and will benefit from further skilled therapy to return to prior level of function.    Personal Factors and Comorbidities  Past/Current Experience    Examination-Activity Limitations  Locomotion Level    Examination-Participation Restrictions  Community Activity    Stability/Clinical Decision Making  Stable/Uncomplicated    Rehab Potential  Good    Clinical Impairments Affecting Rehab Potential  (-) hx of MS; (+) good motivation    PT Frequency  2x / week    PT Duration  4 weeks    PT Treatment/Interventions  Moist Heat;Iontophoresis 4mg /ml Dexamethasone;Electrical Stimulation;Aquatic Therapy;Cryotherapy;Therapeutic activities;Neuromuscular re-education;Therapeutic exercise;Patient/family education;Dry needling;Manual techniques;Passive range of motion;Balance training;Functional mobility training;Gait training;Stair training;Joint Manipulations    PT Next Visit Plan  Continue addressing static (eyes closed), dynamic balance exercises, plyometric power movements, as well as incorporating more dual tasking and coordination activities; update HEP    PT Home Exercise Plan  hip flexion in sitting for strengthening, hip flexion knee flexion in standing for balance    Consulted and Agree with Plan of Care  Patient        Patient will benefit from skilled therapeutic intervention in order to improve the following deficits and impairments:  Pain, Decreased coordination, Impaired perceived functional ability, Increased fascial restricitons, Increased muscle spasms, Difficulty walking, Abnormal gait, Decreased balance, Decreased mobility, Impaired sensation  Visit Diagnosis: Difficulty in walking, not elsewhere classified  Muscle weakness (generalized)  History of falling     Problem List There are no active problems to display for this patient.   [name], PT DPT 01/21/2019, 8:10 AM  [place] [facility] [address], [place], [address]  Phone: [phone]   Fax:  [phone]  Name: SHACARA GOLLIHAR MRN: [id number] Date of Birth: [birth year]

## 2019-01-22 ENCOUNTER — Ambulatory Visit: Payer: BC Managed Care – PPO

## 2019-01-22 ENCOUNTER — Other Ambulatory Visit: Payer: Self-pay

## 2019-01-22 DIAGNOSIS — M6281 Muscle weakness (generalized): Secondary | ICD-10-CM

## 2019-01-22 DIAGNOSIS — R262 Difficulty in walking, not elsewhere classified: Secondary | ICD-10-CM | POA: Diagnosis not present

## 2019-01-22 DIAGNOSIS — Z9181 History of falling: Secondary | ICD-10-CM | POA: Diagnosis not present

## 2019-01-22 NOTE — Therapy (Signed)
[place] [facility] [address] [place], [address] Phone: [phone]   Fax:  [phone]  Physical Therapy Treatment  Patient Details  Name: Michelle Ruiz MRN: [id number] Date of Birth: 10/17/1970 Referring Provider (PT): Darrin Luis MD   Encounter Date: 01/22/2019  PT End of Session - 01/22/19 1722    Visit Number  78    Number of Visits  28    Date for PT Re-Evaluation  02/19/19    Authorization Type  BCBS    PT Start Time  [id number]    PT Stop Time  1800    PT Time Calculation (min)  45 min    Activity Tolerance  Patient tolerated treatment well;No increased pain;Patient limited by fatigue    Behavior During Therapy  [facility] for tasks assessed/performed       Past Medical History:  Diagnosis Date  . Abnormal Pap smear of cervix   . Anxiety   . Kidney stones   . MS (multiple sclerosis) ([place])   . UTI (urinary tract infection)     Past Surgical History:  Procedure Laterality Date  . BREAST BIOPSY Right 2010   stereotactic biopsy/ neg/ dr.byrnett  . BREAST BIOPSY Left 2012   neg/dr. brynett  . CESAREAN SECTION  2008   RPH  . TONSILLECTOMY  2006    There were no vitals filed for this visit.  Subjective Assessment - 01/22/19 1718    Subjective  Patient reports she is trying to get used to zoloft increase (since Sunday).    Patient is accompained by:  Family member    Pertinent History  Patient reports history of falls x3 in the past 6 months secondary to drop foot on the L LE. PMH: Multiple sclerosis; elbow fracture of the R UE    Limitations  Standing;Walking    Currently in Pain?  No/denies         TREATMENT   TE  Heel raises x 10 R/L Heel lowers eccentric x 10 R/L Calf stretch x 30 sec Ankle jumps x 10 with UE support Bounding with BUE support - deferred due to clonus provocation TG lv 15: bounding x 10 SLS 5 x 10 sec on L Running 4 x 50 ft cues for tempo and verbal  encouragement Marching 2 x 50 ft cues for tempo and verbal encouragement Ball bridges x 10 UE support, x 10 without UE support  TE for improved extensor push off for improved energy conservation with walking and running     PT Education - 01/22/19 1720    Education Details  form/technique with exercise    Person(s) Educated  Patient    Methods  Explanation;Demonstration;Tactile cues;Verbal cues    Comprehension  Verbalized understanding;Returned demonstration;Verbal cues required;Tactile cues required          PT Long Term Goals - 01/08/19 1743      PT LONG TERM GOAL #1   Title  Patient will be independent with balance HEP to continue benefits of therapy after discharge.    Baseline  dependent with form/technique for balance exercise; 03/25/2018: Independent     Time  6    Period  Weeks    Status  Achieved      PT LONG TERM GOAL #2   Title  Patient will improve FGA by 4 points to indicate significant improvement with balancing while ambulating and decrease of fall risk    Baseline  03/25/2018: 28/30    Time  6    Period  Weeks    Status  Achieved      PT LONG TERM GOAL #3   Title  Patient will be able to perform prolonged single leg stance >10sec with eyes closed to indicate improvement with static balance and decrease fall risk    Baseline  2 sec in standing EC; 03/25/2018: 3 sec; 04/29/2018: 4 sec; 08/21/2018: 5 sec; 09/19/2018: 6 sec ; 10/21/2018 3 sec; 11/21/2018 5 sec; 01/08/2019: 9 sec    Time  4    Period  Weeks    Status  On-going      PT LONG TERM GOAL #4   Title  Pt will improve 6 MWT >2014ft to demonstrate improved cardiorespiratory fitness    Baseline  Deferred to next session: 04/29/2018; 05/01/2018: 1473ft; 08/21/2018: 1459ft; 09/19/2018: 1429ft; 10/21/2018 1621; 11/21/2018 deferred to next session 2/2 time.; 01/08/2019: 1480 ft    Time  4    Period  Weeks    Status  On-going      PT LONG TERM GOAL #5   Title  Patient will be able to run 267ft without stopping to  better be able to return to recreational activites and address cardiovascular endurance.    Baseline  28ft    Time  4    Period  Weeks    Status  New            Plan - 01/22/19 1809    Clinical Impression Statement  Patient demonstrates improvements with SLS over L achieving 8 seconds consecutively with therapist cues for visualization strategies. Clonus continues to limit power generation with push off but sequencing improved with tempo cues leading to more fluid and continuous movement quality. Patient is improving overall and will benefit from skilled physical therapy to return to PLOF.    Personal Factors and Comorbidities  Past/Current Experience    Examination-Activity Limitations  Locomotion Level    Examination-Participation Restrictions  Community Activity    Stability/Clinical Decision Making  Stable/Uncomplicated    Rehab Potential  Good    Clinical Impairments Affecting Rehab Potential  (-) hx of MS; (+) good motivation    PT Frequency  2x / week    PT Duration  4 weeks    PT Treatment/Interventions  Moist Heat;Iontophoresis 4mg /ml Dexamethasone;Electrical Stimulation;Aquatic Therapy;Cryotherapy;Therapeutic activities;Neuromuscular re-education;Therapeutic exercise;Patient/family education;Dry needling;Manual techniques;Passive range of motion;Balance training;Functional mobility training;Gait training;Stair training;Joint Manipulations    PT Next Visit Plan  Continue addressing static (eyes closed), dynamic balance exercises, plyometric power movements, as well as incorporating more dual tasking and coordination activities; update HEP    PT Home Exercise Plan  hip flexion in sitting for strengthening, hip flexion knee flexion in standing for balance    Consulted and Agree with Plan of Care  Patient       Patient will benefit from skilled therapeutic intervention in order to improve the following deficits and impairments:  Pain, Decreased coordination, Impaired perceived  functional ability, Increased fascial restricitons, Increased muscle spasms, Difficulty walking, Abnormal gait, Decreased balance, Decreased mobility, Impaired sensation  Visit Diagnosis: Difficulty in walking, not elsewhere classified  Muscle weakness (generalized)  History of falling     Problem List There are no active problems to display for this patient.   [name], SPT 01/22/2019, 6:16 PM  [place] [facility] [address], [place], [address] Phone: [phone]   Fax:  [phone]  Name: Michelle Ruiz MRN: [id number] Date of Birth: [birth year]

## 2019-01-27 ENCOUNTER — Ambulatory Visit: Payer: BC Managed Care – PPO | Attending: Neurology

## 2019-01-27 ENCOUNTER — Other Ambulatory Visit: Payer: Self-pay

## 2019-01-27 DIAGNOSIS — R262 Difficulty in walking, not elsewhere classified: Secondary | ICD-10-CM | POA: Diagnosis not present

## 2019-01-27 DIAGNOSIS — M6281 Muscle weakness (generalized): Secondary | ICD-10-CM | POA: Diagnosis not present

## 2019-01-27 DIAGNOSIS — Z9181 History of falling: Secondary | ICD-10-CM | POA: Diagnosis not present

## 2019-01-27 NOTE — Therapy (Signed)
[facility] [facility] [address] [place], [address] Phone: [phone]   Fax:  [phone]  Physical Therapy Treatment  Patient Details  Name: Michelle Ruiz MRN: [id number] Date of Birth: 01-31-1971 Referring Provider (PT): Darrin Luis MD   Encounter Date: 01/27/2019  PT End of Session - 01/27/19 1727    Visit Number  65    Number of Visits  78    Date for PT Re-Evaluation  02/19/19    Authorization Type  BCBS    PT Start Time  [id number]    PT Stop Time  1800    PT Time Calculation (min)  45 min    Activity Tolerance  Patient tolerated treatment well;No increased pain;Patient limited by fatigue    Behavior During Therapy  [facility] for tasks assessed/performed       Past Medical History:  Diagnosis Date  . Abnormal Pap smear of cervix   . Anxiety   . Kidney stones   . MS (multiple sclerosis) ([place])   . UTI (urinary tract infection)     Past Surgical History:  Procedure Laterality Date  . BREAST BIOPSY Right 2010   stereotactic biopsy/ neg/ dr.byrnett  . BREAST BIOPSY Left 2012   neg/dr. brynett  . CESAREAN SECTION  2008   RPH  . TONSILLECTOMY  2006    There were no vitals filed for this visit.  Subjective Assessment - 01/27/19 1725    Subjective  Patient states she has had no signfiicant changes since the previous session.    Patient is accompained by:  Family member    Pertinent History  Patient reports history of falls x3 in the past 6 months secondary to drop foot on the L LE. PMH: Multiple sclerosis; elbow fracture of the R UE    Limitations  Standing;Walking    Currently in Pain?  No/denies       TREATMENT   TE Running 2 x 50 ft cues for tempo and verbal encouragement Squats in standing with UE support - x 20 Leg swings lateral and forward backward - x 20  Heel raises x 10 R/L, x5 R/L  Walking lunges in standing - x 22ft Calf stretch x 30 sec Marching 2 x 20 cues for tempo and verbal  encouragement Tandem stance on airex pad - x 30sec x 2  Ball toss single leg against rebounder with 2kg ball - x 10 B Ball toss semi tandem on airex pad - x 14 two hand ball toss   TE for improved extensor push off for improved energy conservation with walking and running    PT Education - 01/27/19 1727    Education Details  form/technique with exercise    Person(s) Educated  Patient    Methods  Explanation;Demonstration    Comprehension  Verbalized understanding;Returned demonstration          PT Long Term Goals - 01/08/19 1743      PT LONG TERM GOAL #1   Title  Patient will be independent with balance HEP to continue benefits of therapy after discharge.    Baseline  dependent with form/technique for balance exercise; 03/25/2018: Independent     Time  6    Period  Weeks    Status  Achieved      PT LONG TERM GOAL #2   Title  Patient will improve FGA by 4 points to indicate significant improvement with balancing while ambulating and decrease of fall risk  Baseline  03/25/2018: 28/30    Time  6    Period  Weeks    Status  Achieved      PT LONG TERM GOAL #3   Title  Patient will be able to perform prolonged single leg stance >10sec with eyes closed to indicate improvement with static balance and decrease fall risk    Baseline  2 sec in standing EC; 03/25/2018: 3 sec; 04/29/2018: 4 sec; 08/21/2018: 5 sec; 09/19/2018: 6 sec ; 10/21/2018 3 sec; 11/21/2018 5 sec; 01/08/2019: 9 sec    Time  4    Period  Weeks    Status  On-going      PT LONG TERM GOAL #4   Title  Pt will improve 6 MWT >2046ft to demonstrate improved cardiorespiratory fitness    Baseline  Deferred to next session: 04/29/2018; 05/01/2018: 1458ft; 08/21/2018: 1461ft; 09/19/2018: 1426ft; 10/21/2018 1621; 11/21/2018 deferred to next session 2/2 time.; 01/08/2019: 1480 ft    Time  4    Period  Weeks    Status  On-going      PT LONG TERM GOAL #5   Title  Patient will be able to run 210ft without stopping to better be able  to return to recreational activites and address cardiovascular endurance.    Baseline  17ft    Time  4    Period  Weeks    Status  New            Plan - 01/27/19 1749    Clinical Impression Statement  Patient continues to have difficulty with single leg stance especially with dynamic control. Patient continues to have difficulty with performing forward progression of L LE while running which requiring greater quad and hip flexor activation to perform. Patient is improving overall with improved ability to run for longer distances. Patient will benefit from further skilled therapy to return to prior level of function.    Personal Factors and Comorbidities  Past/Current Experience    Examination-Activity Limitations  Locomotion Level    Examination-Participation Restrictions  Community Activity    Stability/Clinical Decision Making  Stable/Uncomplicated    Rehab Potential  Good    Clinical Impairments Affecting Rehab Potential  (-) hx of MS; (+) good motivation    PT Frequency  2x / week    PT Duration  4 weeks    PT Treatment/Interventions  Moist Heat;Iontophoresis 4mg /ml Dexamethasone;Electrical Stimulation;Aquatic Therapy;Cryotherapy;Therapeutic activities;Neuromuscular re-education;Therapeutic exercise;Patient/family education;Dry needling;Manual techniques;Passive range of motion;Balance training;Functional mobility training;Gait training;Stair training;Joint Manipulations    PT Next Visit Plan  Continue addressing static (eyes closed), dynamic balance exercises, plyometric power movements, as well as incorporating more dual tasking and coordination activities; update HEP    PT Home Exercise Plan  hip flexion in sitting for strengthening, hip flexion knee flexion in standing for balance    Consulted and Agree with Plan of Care  Patient       Patient will benefit from skilled therapeutic intervention in order to improve the following deficits and impairments:  Pain, Decreased  coordination, Impaired perceived functional ability, Increased fascial restricitons, Increased muscle spasms, Difficulty walking, Abnormal gait, Decreased balance, Decreased mobility, Impaired sensation  Visit Diagnosis: Muscle weakness (generalized)  Difficulty in walking, not elsewhere classified  History of falling     Problem List There are no active problems to display for this patient.   [name], PT DPT 01/27/2019, 6:06 PM  [place] [facility] [address], [place], [address]  Phone: [phone]   Fax:  [phone]  Name: LAVONA KREUSER MRN: [id number] Date of Birth: 1970/04/21

## 2019-01-29 ENCOUNTER — Ambulatory Visit: Payer: BC Managed Care – PPO

## 2019-02-04 DIAGNOSIS — H16001 Unspecified corneal ulcer, right eye: Secondary | ICD-10-CM | POA: Diagnosis not present

## 2019-02-10 ENCOUNTER — Other Ambulatory Visit: Payer: Self-pay

## 2019-02-10 ENCOUNTER — Ambulatory Visit: Payer: BC Managed Care – PPO

## 2019-02-10 DIAGNOSIS — M6281 Muscle weakness (generalized): Secondary | ICD-10-CM

## 2019-02-10 DIAGNOSIS — R262 Difficulty in walking, not elsewhere classified: Secondary | ICD-10-CM

## 2019-02-10 DIAGNOSIS — Z9181 History of falling: Secondary | ICD-10-CM | POA: Diagnosis not present

## 2019-02-11 NOTE — Therapy (Signed)
[facility] [address] [place], [address] Phone: [phone]   Fax:  [phone]  Physical Therapy Treatment  Patient Details  Name: Michelle Ruiz MRN: [id number] Date of Birth: [birth year] No data recorded  Encounter Date: 02/10/2019  PT End of Session - 02/10/19 1746    Visit Number  36    Number of Visits  55    Date for PT Re-Evaluation  02/19/19    Authorization Type  BCBS    PT Start Time  [id number]    PT Stop Time  1800    PT Time Calculation (min)  45 min    Activity Tolerance  Patient tolerated treatment well;No increased pain;Patient limited by fatigue    Behavior During Therapy  [facility] for tasks assessed/performed       Past Medical History:  Diagnosis Date  . Abnormal Pap smear of cervix   . Anxiety   . Kidney stones   . MS (multiple sclerosis) ([place])   . UTI (urinary tract infection)     Past Surgical History:  Procedure Laterality Date  . BREAST BIOPSY Right 2010   stereotactic biopsy/ neg/ dr.byrnett  . BREAST BIOPSY Left 2012   neg/dr. brynett  . CESAREAN SECTION  2008   RPH  . TONSILLECTOMY  2006    There were no vitals filed for this visit.  Subjective Assessment - 02/10/19 1733    Subjective  Patient reports she fell twicebetween today and the previous session. She was leaving work during both situations and "scuffed" her foot on the carpet. She feel on the L side suring both equations.    Patient is accompained by:  Family member    Pertinent History  Patient reports history of falls x3 in the past 6 months secondary to drop foot on the L LE. PMH: Multiple sclerosis; elbow fracture of the R UE    Limitations  Standing;Walking    Currently in Pain?  No/denies       TREATMENT Therapeutic Exercise *used 2# weight during entirety of the session around L ankle * Stepping over hurdles 7" - x 20 to address improving hip flexion with ambulation * Requires cueing to decrease   Walking with anterior to posterior perturbations to address stepping out ability for balance - x 20 Widened tandem stance with performing heel raises and subsequent toe raises B to improve sagittal plane stabilization - x 20  Forward leans with therapist support randomly letting go randomly to address stepping out to maintain balance - x 20 Dorsiflexion in standing without UE support - x 20 to address dorsiflexion strength with foot dorsiflexion strength Walking quickly with practicing "scuffing" of foot with EC - x 20 to address stepping forward to maintain balance  PT Education - 02/10/19 1746    Education Details  form/technique with exercise    Person(s) Educated  Patient    Methods  Explanation;Demonstration    Comprehension  Verbalized understanding;Returned demonstration          PT Long Term Goals - 01/08/19 1743      PT LONG TERM GOAL #1   Title  Patient will be independent with balance HEP to continue benefits of therapy after discharge.    Baseline  dependent with form/technique for balance exercise; 03/25/2018: Independent     Time  6    Period  Weeks    Status  Achieved      PT LONG TERM GOAL #2  Title  Patient will improve FGA by 4 points to indicate significant improvement with balancing while ambulating and decrease of fall risk    Baseline  03/25/2018: 28/30    Time  6    Period  Weeks    Status  Achieved      PT LONG TERM GOAL #3   Title  Patient will be able to perform prolonged single leg stance >10sec with eyes closed to indicate improvement with static balance and decrease fall risk    Baseline  2 sec in standing EC; 03/25/2018: 3 sec; 04/29/2018: 4 sec; 08/21/2018: 5 sec; 09/19/2018: 6 sec ; 10/21/2018 3 sec; 11/21/2018 5 sec; 01/08/2019: 9 sec    Time  4    Period  Weeks    Status  On-going      PT LONG TERM GOAL #4   Title  Pt will improve 6 MWT >2032ft to demonstrate improved cardiorespiratory fitness    Baseline  Deferred to next session: 04/29/2018;  05/01/2018: 1434ft; 08/21/2018: 1475ft; 09/19/2018: 1434ft; 10/21/2018 1621; 11/21/2018 deferred to next session 2/2 time.; 01/08/2019: 1480 ft    Time  4    Period  Weeks    Status  On-going      PT LONG TERM GOAL #5   Title  Patient will be able to run 245ft without stopping to better be able to return to recreational activites and address cardiovascular endurance.    Baseline  34ft    Time  4    Period  Weeks    Status  New            Plan - 02/11/19 0813    Clinical Impression Statement  Attached ankle weight to patient's affected L LE around the ankle to add resistance to stepping response. Utilized distraction techniques while ambulating with posterior to anterior purtubations to further address forward stepping techniques to improve balance. Patient's falls (x2) both happened when distacted leaving work, where she did not volitionally think to perform active dorsiflexion on the affected side with amb. This is the second time where  patient has fallen while having to miss physical therapy indicating further need for therapeutic services to decrease fall risk. Patient will benefit from further skilled therapy to return to prior level of function.    Personal Factors and Comorbidities  Past/Current Experience    Examination-Activity Limitations  Locomotion Level    Examination-Participation Restrictions  Community Activity    Stability/Clinical Decision Making  Stable/Uncomplicated    Rehab Potential  Good    Clinical Impairments Affecting Rehab Potential  (-) hx of MS; (+) good motivation    PT Frequency  2x / week    PT Duration  4 weeks    PT Treatment/Interventions  Moist Heat;Iontophoresis 4mg /ml Dexamethasone;Electrical Stimulation;Aquatic Therapy;Cryotherapy;Therapeutic activities;Neuromuscular re-education;Therapeutic exercise;Patient/family education;Dry needling;Manual techniques;Passive range of motion;Balance training;Functional mobility training;Gait training;Stair  training;Joint Manipulations    PT Next Visit Plan  Continue addressing static (eyes closed), dynamic balance exercises, plyometric power movements, as well as incorporating more dual tasking and coordination activities; update HEP    PT Home Exercise Plan  hip flexion in sitting for strengthening, hip flexion knee flexion in standing for balance    Consulted and Agree with Plan of Care  Patient       Patient will benefit from skilled therapeutic intervention in order to improve the following deficits and impairments:  Pain, Decreased coordination, Impaired perceived functional ability, Increased fascial restricitons, Increased muscle spasms, Difficulty walking, Abnormal gait, Decreased balance, Decreased  mobility, Impaired sensation  Visit Diagnosis: Difficulty in walking, not elsewhere classified  Muscle weakness (generalized)     Problem List There are no active problems to display for this patient.   [name], PT DPT 02/11/2019, 8:27 AM  [facility] [address] [place], [address] Phone: [phone]   Fax:  [phone]  Name: AVALIA SLONECKER MRN: [id number] Date of Birth: 1970/06/15

## 2019-02-12 ENCOUNTER — Other Ambulatory Visit: Payer: Self-pay

## 2019-02-12 ENCOUNTER — Ambulatory Visit: Payer: BC Managed Care – PPO

## 2019-02-12 DIAGNOSIS — R262 Difficulty in walking, not elsewhere classified: Secondary | ICD-10-CM

## 2019-02-12 DIAGNOSIS — M6281 Muscle weakness (generalized): Secondary | ICD-10-CM | POA: Diagnosis not present

## 2019-02-12 DIAGNOSIS — Z9181 History of falling: Secondary | ICD-10-CM | POA: Diagnosis not present

## 2019-02-12 NOTE — Therapy (Signed)
[facility] [address] [place], [address] Phone: [phone]   Fax:  [phone]  Physical Therapy Treatment  Patient Details  Name: Michelle Ruiz MRN: [id number] Date of Birth: 01-Sep-1970 No data recorded  Encounter Date: 02/12/2019  PT End of Session - 02/12/19 1716    Visit Number  73    Number of Visits  67    Date for PT Re-Evaluation  02/19/19    Authorization Type  BCBS    PT Start Time  [id number]    PT Stop Time  1800    PT Time Calculation (min)  45 min    Activity Tolerance  Patient tolerated treatment well;No increased pain;Patient limited by fatigue    Behavior During Therapy  [facility] for tasks assessed/performed       Past Medical History:  Diagnosis Date  . Abnormal Pap smear of cervix   . Anxiety   . Kidney stones   . MS (multiple sclerosis) ([place])   . UTI (urinary tract infection)     Past Surgical History:  Procedure Laterality Date  . BREAST BIOPSY Right 2010   stereotactic biopsy/ neg/ dr.byrnett  . BREAST BIOPSY Left 2012   neg/dr. brynett  . CESAREAN SECTION  2008   RPH  . TONSILLECTOMY  2006    There were no vitals filed for this visit.  TREATMENT  TE Hip swings lateral flexion/extension x 20 for increased tissue perfusion and improved therapy tolerance Walking over hurdles 10 x 50 ft at tempo of 90 bpm Retrowalking over hurdles 2 x 50 ft Ambulation x 500 ft with dual tasking with ball toss and avoiding obstacles Grapevine steps 10 x 20 ft with external directional cues and talking for dual tasking with complex motor task Tandem walking 10 ft lengths x 12 with dual tasking with ball toss and cues for switching directions "Trust fall" exercise in standing x 10, in midstance over R x 10 Step ups onto foam with 2 sec hold x 10 R/L Quick steps forward/back with dual tasking number recognition tasks tempo 135 x 45 sec  TE for dual tasking with ambulation to improve stepping  strategy and reduce fall risk    PT Education - 02/12/19 1716    Education Details  form/technique with exercise    Person(s) Educated  Patient    Methods  Explanation;Demonstration;Verbal cues    Comprehension  Verbalized understanding;Returned demonstration;Verbal cues required          PT Long Term Goals - 01/08/19 1743      PT LONG TERM GOAL #1   Title  Patient will be independent with balance HEP to continue benefits of therapy after discharge.    Baseline  dependent with form/technique for balance exercise; 03/25/2018: Independent     Time  6    Period  Weeks    Status  Achieved      PT LONG TERM GOAL #2   Title  Patient will improve FGA by 4 points to indicate significant improvement with balancing while ambulating and decrease of fall risk    Baseline  03/25/2018: 28/30    Time  6    Period  Weeks    Status  Achieved      PT LONG TERM GOAL #3   Title  Patient will be able to perform prolonged single leg stance >10sec with eyes closed to indicate improvement with static balance and decrease fall risk    Baseline  2 sec in standing EC; 03/25/2018: 3 sec; 04/29/2018: 4 sec; 08/21/2018: 5 sec; 09/19/2018: 6 sec ; 10/21/2018 3 sec; 11/21/2018 5 sec; 01/08/2019: 9 sec    Time  4    Period  Weeks    Status  On-going      PT LONG TERM GOAL #4   Title  Pt will improve 6 MWT >2027ft to demonstrate improved cardiorespiratory fitness    Baseline  Deferred to next session: 04/29/2018; 05/01/2018: 1421ft; 08/21/2018: 1439ft; 09/19/2018: 1433ft; 10/21/2018 1621; 11/21/2018 deferred to next session 2/2 time.; 01/08/2019: 1480 ft    Time  4    Period  Weeks    Status  On-going      PT LONG TERM GOAL #5   Title  Patient will be able to run 248ft without stopping to better be able to return to recreational activites and address cardiovascular endurance.    Baseline  50ft    Time  4    Period  Weeks    Status  New            Plan - 02/12/19 1941    Clinical Impression Statement   Treatment today focused on multiple dual-task balance and ambulation activities challenging feedforward and feedback postural control. Patient performed well demonstrating improvements in stepping strategies but apprehension with SLS activities and elevated fall risk remains. Patient will benefit from additional skilled physical therapy to reduce fall risk and return to PLOF.    Personal Factors and Comorbidities  Past/Current Experience    Examination-Activity Limitations  Locomotion Level    Examination-Participation Restrictions  Community Activity    Stability/Clinical Decision Making  Stable/Uncomplicated    Rehab Potential  Good    Clinical Impairments Affecting Rehab Potential  (-) hx of MS; (+) good motivation    PT Frequency  2x / week    PT Duration  4 weeks    PT Treatment/Interventions  Moist Heat;Iontophoresis 4mg /ml Dexamethasone;Electrical Stimulation;Aquatic Therapy;Cryotherapy;Therapeutic activities;Neuromuscular re-education;Therapeutic exercise;Patient/family education;Dry needling;Manual techniques;Passive range of motion;Balance training;Functional mobility training;Gait training;Stair training;Joint Manipulations    PT Next Visit Plan  Continue addressing static (eyes closed), dynamic balance exercises, plyometric power movements, as well as incorporating more dual tasking and coordination activities; update HEP    PT Home Exercise Plan  hip flexion in sitting for strengthening, hip flexion knee flexion in standing for balance    Consulted and Agree with Plan of Care  Patient       Patient will benefit from skilled therapeutic intervention in order to improve the following deficits and impairments:  Pain, Decreased coordination, Impaired perceived functional ability, Increased fascial restricitons, Increased muscle spasms, Difficulty walking, Abnormal gait, Decreased balance, Decreased mobility, Impaired sensation  Visit Diagnosis: Difficulty in walking, not elsewhere  classified  Muscle weakness (generalized)     Problem List There are no active problems to display for this patient.   [name], SPT 02/13/2019, 3:39 PM  [facility] [address], [place], [address] Phone: [phone]   Fax:  [phone]  Name: Michelle Ruiz MRN: [id number] Date of Birth: May 22, 1970

## 2019-02-17 ENCOUNTER — Ambulatory Visit: Payer: BC Managed Care – PPO

## 2019-02-17 ENCOUNTER — Other Ambulatory Visit: Payer: Self-pay

## 2019-02-17 DIAGNOSIS — R262 Difficulty in walking, not elsewhere classified: Secondary | ICD-10-CM

## 2019-02-17 DIAGNOSIS — M6281 Muscle weakness (generalized): Secondary | ICD-10-CM

## 2019-02-17 DIAGNOSIS — Z9181 History of falling: Secondary | ICD-10-CM | POA: Diagnosis not present

## 2019-02-18 NOTE — Therapy (Signed)
[facility] [address] [place], [address] Phone: [phone]   Fax:  [phone]  Physical Therapy Treatment  Patient Details  Name: Michelle Ruiz MRN: [id number] Date of Birth: 05-06-1970 No data recorded  Encounter Date: 02/17/2019  PT End of Session - 02/17/19 1731    Visit Number  55    Number of Visits  68    Date for PT Re-Evaluation  02/19/19    Authorization Type  BCBS    PT Start Time  [id number]    PT Stop Time  1800    PT Time Calculation (min)  45 min    Activity Tolerance  Patient tolerated treatment well;No increased pain;Patient limited by fatigue    Behavior During Therapy  [facility] for tasks assessed/performed       Past Medical History:  Diagnosis Date  . Abnormal Pap smear of cervix   . Anxiety   . Kidney stones   . MS (multiple sclerosis) ([place])   . UTI (urinary tract infection)     Past Surgical History:  Procedure Laterality Date  . BREAST BIOPSY Right 2010   stereotactic biopsy/ neg/ dr.byrnett  . BREAST BIOPSY Left 2012   neg/dr. brynett  . CESAREAN SECTION  2008   RPH  . TONSILLECTOMY  2006    There were no vitals filed for this visit.  Subjective Assessment - 02/17/19 1725    Subjective  Patient states no major changes since the previous session. Patient states no falls since the previous session.    Patient is accompained by:  Family member    Pertinent History  Patient reports history of falls x3 in the past 6 months secondary to drop foot on the L LE. PMH: Multiple sclerosis; elbow fracture of the R UE    Limitations  Standing;Walking    How long can you stand comfortably?  20 min    How long can you walk comfortably?  1 miles    Patient Stated Goals  To be more balanced and core strengthening     Currently in Pain?  No/denies          TREATMENT   TE Hip swings lateral flexion/extension x 20 for increased tissue perfusion and improved therapy tolerance "Trust  fall" exercise in standing x 10, in midstance over R x 10 Tandem Walking over hurdles with narrow BOS - x 10 Walking lunges -- x 10 B Side lunges with walking -- x 10 B Quick steps forward/back with dual tasking number recognition tasks tempo 135 x 45 sec Ball toss with feet together with foam -- x 30   TE for dual tasking with ambulation to improve stepping strategy and reduce fall risk     PT Education - 02/17/19 1731    Education Details  form/technique with exercise    Person(s) Educated  Patient    Methods  Explanation;Demonstration    Comprehension  Verbalized understanding;Returned demonstration          PT Long Term Goals - 01/08/19 1743      PT LONG TERM GOAL #1   Title  Patient will be independent with balance HEP to continue benefits of therapy after discharge.    Baseline  dependent with form/technique for balance exercise; 03/25/2018: Independent     Time  6    Period  Weeks    Status  Achieved      PT LONG TERM GOAL #2   Title  Patient  will improve FGA by 4 points to indicate significant improvement with balancing while ambulating and decrease of fall risk    Baseline  03/25/2018: 28/30    Time  6    Period  Weeks    Status  Achieved      PT LONG TERM GOAL #3   Title  Patient will be able to perform prolonged single leg stance >10sec with eyes closed to indicate improvement with static balance and decrease fall risk    Baseline  2 sec in standing EC; 03/25/2018: 3 sec; 04/29/2018: 4 sec; 08/21/2018: 5 sec; 09/19/2018: 6 sec ; 10/21/2018 3 sec; 11/21/2018 5 sec; 01/08/2019: 9 sec    Time  4    Period  Weeks    Status  On-going      PT LONG TERM GOAL #4   Title  Pt will improve 6 MWT >2057ft to demonstrate improved cardiorespiratory fitness    Baseline  Deferred to next session: 04/29/2018; 05/01/2018: 1425ft; 08/21/2018: 1449ft; 09/19/2018: 1445ft; 10/21/2018 1621; 11/21/2018 deferred to next session 2/2 time.; 01/08/2019: 1480 ft    Time  4    Period  Weeks     Status  On-going      PT LONG TERM GOAL #5   Title  Patient will be able to run 227ft without stopping to better be able to return to recreational activites and address cardiovascular endurance.    Baseline  50ft    Time  4    Period  Weeks    Status  New            Plan - 02/17/19 1744    Clinical Impression Statement  Patient demonstrates improvement with stepping cues today requiring less cueing and overall better steppage pattern to maintain balance. Although patient is improving, she continues to have a decrease in dorsiflexion when walking with distraction with one close LOB during ambulation. Patient is improving overall and will benefit from further skilled therapy to return to prior level of function.    Personal Factors and Comorbidities  Past/Current Experience    Examination-Activity Limitations  Locomotion Level    Examination-Participation Restrictions  Community Activity    Stability/Clinical Decision Making  Stable/Uncomplicated    Rehab Potential  Good    Clinical Impairments Affecting Rehab Potential  (-) hx of MS; (+) good motivation    PT Frequency  2x / week    PT Duration  4 weeks    PT Treatment/Interventions  Moist Heat;Iontophoresis 4mg /ml Dexamethasone;Electrical Stimulation;Aquatic Therapy;Cryotherapy;Therapeutic activities;Neuromuscular re-education;Therapeutic exercise;Patient/family education;Dry needling;Manual techniques;Passive range of motion;Balance training;Functional mobility training;Gait training;Stair training;Joint Manipulations    PT Next Visit Plan  Continue addressing static (eyes closed), dynamic balance exercises, plyometric power movements, as well as incorporating more dual tasking and coordination activities; update HEP    PT Home Exercise Plan  hip flexion in sitting for strengthening, hip flexion knee flexion in standing for balance    Consulted and Agree with Plan of Care  Patient       Patient will benefit from skilled therapeutic  intervention in order to improve the following deficits and impairments:  Pain, Decreased coordination, Impaired perceived functional ability, Increased fascial restricitons, Increased muscle spasms, Difficulty walking, Abnormal gait, Decreased balance, Decreased mobility, Impaired sensation  Visit Diagnosis: Difficulty in walking, not elsewhere classified  Muscle weakness (generalized)     Problem List There are no active problems to display for this patient.   [name], PT DPT 02/18/2019, 8:38 AM  [facility] [address], [place], [address] Phone: [phone]   Fax:  [phone]  Name: Michelle Ruiz MRN: [id number] Date of Birth: [birth year]

## 2019-02-19 ENCOUNTER — Other Ambulatory Visit: Payer: Self-pay

## 2019-02-19 ENCOUNTER — Ambulatory Visit: Payer: BC Managed Care – PPO

## 2019-02-19 DIAGNOSIS — M6281 Muscle weakness (generalized): Secondary | ICD-10-CM

## 2019-02-19 DIAGNOSIS — R262 Difficulty in walking, not elsewhere classified: Secondary | ICD-10-CM

## 2019-02-19 DIAGNOSIS — Z9181 History of falling: Secondary | ICD-10-CM

## 2019-02-19 NOTE — Therapy (Signed)
[facility] [address], [place], [address] Phone: [phone]   Fax:  [phone]  Physical Therapy Treatment  Patient Details  Name: Michelle Ruiz MRN: [id number] Date of Birth: [birth year] No data recorded  Encounter Date: 02/19/2019  PT End of Session - 02/19/19 1718    Visit Number  24    Number of Visits  5    Date for PT Re-Evaluation  02/19/19    Authorization Type  BCBS    PT Start Time  [id number]    PT Stop Time  1800    PT Time Calculation (min)  45 min    Activity Tolerance  Patient tolerated treatment well;No increased pain;Patient limited by fatigue    Behavior During Therapy  [facility] for tasks assessed/performed       Past Medical History:  Diagnosis Date  . Abnormal Pap smear of cervix   . Anxiety   . Kidney stones   . MS (multiple sclerosis) ([place])   . UTI (urinary tract infection)     Past Surgical History:  Procedure Laterality Date  . BREAST BIOPSY Right 2010   stereotactic biopsy/ neg/ dr.byrnett  . BREAST BIOPSY Left 2012   neg/dr. brynett  . CESAREAN SECTION  2008   RPH  . TONSILLECTOMY  2006    There were no vitals filed for this visit.  Subjective Assessment - 02/19/19 1715    Subjective  Patient states she turned her ankle when waling but was able to maintain balance while walking. Patient reports no other major changes.    Patient is accompained by:  Family member    Pertinent History  Patient reports history of falls x3 in the past 6 months secondary to drop foot on the L LE. PMH: Multiple sclerosis; elbow fracture of the R UE    Limitations  Standing;Walking    How long can you stand comfortably?  20 min    How long can you walk comfortably?  1 miles    Patient Stated Goals  To be more balanced and core strengthening     Currently in Pain?  No/denies          TREATMENT   TE Hip swings lateral flexion/extension x 20 for increased tissue perfusion and improved  therapy tolerance Walking lunges --  2 x 10 B Ball toss with feet together with foam -- x 30 Single leg stance - 90sec on each size Walking with pertubations - x 6 min, performing serial 7's and distractions Marches on large dynadisc - x 7 B Quick steps forward over 24 " - x 20 Tempo box steps with 120 bpm tempo - x 64min    TE for dual tasking with ambulation to improve stepping strategy and reduce fall risk    PT Education - 02/19/19 1715    Education Details  form/technique with exercise    Person(s) Educated  Patient    Methods  Explanation;Demonstration    Comprehension  Verbalized understanding;Returned demonstration          PT Long Term Goals - 01/08/19 1743      PT LONG TERM GOAL #1   Title  Patient will be independent with balance HEP to continue benefits of therapy after discharge.    Baseline  dependent with form/technique for balance exercise; 03/25/2018: Independent     Time  6    Period  Weeks    Status  Achieved      PT  LONG TERM GOAL #2   Title  Patient will improve FGA by 4 points to indicate significant improvement with balancing while ambulating and decrease of fall risk    Baseline  03/25/2018: 28/30    Time  6    Period  Weeks    Status  Achieved      PT LONG TERM GOAL #3   Title  Patient will be able to perform prolonged single leg stance >10sec with eyes closed to indicate improvement with static balance and decrease fall risk    Baseline  2 sec in standing EC; 03/25/2018: 3 sec; 04/29/2018: 4 sec; 08/21/2018: 5 sec; 09/19/2018: 6 sec ; 10/21/2018 3 sec; 11/21/2018 5 sec; 01/08/2019: 9 sec    Time  4    Period  Weeks    Status  On-going      PT LONG TERM GOAL #4   Title  Pt will improve 6 MWT >2057ft to demonstrate improved cardiorespiratory fitness    Baseline  Deferred to next session: 04/29/2018; 05/01/2018: 1436ft; 08/21/2018: 14104ft; 09/19/2018: 1462ft; 10/21/2018 1621; 11/21/2018 deferred to next session 2/2 time.; 01/08/2019: 1480 ft    Time  4     Period  Weeks    Status  On-going      PT LONG TERM GOAL #5   Title  Patient will be able to run 284ft without stopping to better be able to return to recreational activites and address cardiovascular endurance.    Baseline  14ft    Time  4    Period  Weeks    Status  New            Plan - 02/19/19 1737    Clinical Impression Statement  Worked on improving walking ability with saggital movements to better represent walking in particularly improving dorsiflexion with the performance of stepping tasks. Patient demosntrates difficulty with balancing and has particular difficulty with performing with performing stepping tasks. Patient demonstrates conitnued difficulty with balancing within narrow BOS and will require continuous work on this to improve. Patient is improving overall and will benefit from further skilled therapy to return to prior level of function.    Personal Factors and Comorbidities  Past/Current Experience    Examination-Activity Limitations  Locomotion Level    Examination-Participation Restrictions  Community Activity    Stability/Clinical Decision Making  Stable/Uncomplicated    Rehab Potential  Good    Clinical Impairments Affecting Rehab Potential  (-) hx of MS; (+) good motivation    PT Frequency  2x / week    PT Duration  4 weeks    PT Treatment/Interventions  Moist Heat;Iontophoresis 4mg /ml Dexamethasone;Electrical Stimulation;Aquatic Therapy;Cryotherapy;Therapeutic activities;Neuromuscular re-education;Therapeutic exercise;Patient/family education;Dry needling;Manual techniques;Passive range of motion;Balance training;Functional mobility training;Gait training;Stair training;Joint Manipulations    PT Next Visit Plan  Continue addressing static (eyes closed), dynamic balance exercises, plyometric power movements, as well as incorporating more dual tasking and coordination activities; update HEP    PT Home Exercise Plan  hip flexion in sitting for strengthening, hip  flexion knee flexion in standing for balance    Consulted and Agree with Plan of Care  Patient       Patient will benefit from skilled therapeutic intervention in order to improve the following deficits and impairments:  Pain, Decreased coordination, Impaired perceived functional ability, Increased fascial restricitons, Increased muscle spasms, Difficulty walking, Abnormal gait, Decreased balance, Decreased mobility, Impaired sensation  Visit Diagnosis: Difficulty in walking, not elsewhere classified  Muscle weakness (generalized)  History of falling  Problem List There are no active problems to display for this patient.   [name], PT DPT 02/19/2019, 6:00 PM  [place] [facility] [address], [place], [address] Phone: [phone]   Fax:  [phone]  Name: Michelle Ruiz MRN: [id number] Date of Birth: 09/23/1970

## 2019-02-25 ENCOUNTER — Other Ambulatory Visit: Payer: Self-pay

## 2019-02-25 ENCOUNTER — Ambulatory Visit: Payer: BC Managed Care – PPO | Attending: Neurology

## 2019-02-25 DIAGNOSIS — Z9181 History of falling: Secondary | ICD-10-CM | POA: Insufficient documentation

## 2019-02-25 DIAGNOSIS — R262 Difficulty in walking, not elsewhere classified: Secondary | ICD-10-CM | POA: Diagnosis not present

## 2019-02-25 DIAGNOSIS — M6281 Muscle weakness (generalized): Secondary | ICD-10-CM

## 2019-02-26 NOTE — Therapy (Addendum)
[facility] [address] [place], [address] Phone: [phone]   Fax:  [phone]  Physical Therapy Treatment/Progress Note  Patient Details  Name: Michelle Ruiz MRN: [id number] Date of Birth: [birth year] No data recorded Reporting period 01/08/2019 - 02/25/2019  Encounter Date: 02/25/2019  PT End of Session - 02/25/19 1716    Visit Number  29    Number of Visits  51    Date for PT Re-Evaluation  04/08/19    Authorization Type  BCBS    PT Start Time  1655    PT Stop Time  1740    PT Time Calculation (min)  45 min    Activity Tolerance  Patient tolerated treatment well;No increased pain;Patient limited by fatigue    Behavior During Therapy  [facility] for tasks assessed/performed       Past Medical History:  Diagnosis Date  . Abnormal Pap smear of cervix   . Anxiety   . Kidney stones   . MS (multiple sclerosis) ([place])   . UTI (urinary tract infection)     Past Surgical History:  Procedure Laterality Date  . BREAST BIOPSY Right 2010   stereotactic biopsy/ neg/ dr.byrnett  . BREAST BIOPSY Left 2012   neg/dr. brynett  . CESAREAN SECTION  2008   RPH  . TONSILLECTOMY  2006    There were no vitals filed for this visit.  Subjective Assessment - 02/25/19 1659    Subjective  Patient reports she has been feeling a weird 'buzzing in her left knee when walking. Patient states these symptoms have been around for less than a week.    Patient is accompained by:  Family member    Pertinent History  Patient reports history of falls x3 in the past 6 months secondary to drop foot on the L LE. PMH: Multiple sclerosis; elbow fracture of the R UE    Limitations  Standing;Walking    How long can you stand comfortably?  20 min    How long can you walk comfortably?  1 miles    Patient Stated Goals  To be more balanced and core strengthening     Currently in Pain?  No/denies        TREATMENT Therapeutic  Exercise Quad set in long sitting -- 3 x 10 SLR with patient in long sitting -- 2 x 10  Calf stretch in standing on step -- x 10 with 10sec Running/jogging with working on improving steppage gait pattern -- , x 43ft, x 35 ft, x90 ft Ambulation for time 1385ft several instances of LOB that requires self recovery to maintain balance. Address use of steppage gait and focus on maintaining increased speed during the motion.  Discussed POC    PT Education - 02/25/19 1715    Education Details  form/technique with exercise    Person(s) Educated  Patient    Methods  Explanation;Demonstration    Comprehension  Verbalized understanding;Returned demonstration          PT Long Term Goals - 02/25/19 1721      PT LONG TERM GOAL #1   Title  Patient will be independent with balance HEP to continue benefits of therapy after discharge.    Baseline  dependent with form/technique for balance exercise; 03/25/2018: Independent     Time  6    Period  Weeks    Status  Achieved      PT LONG TERM GOAL #2   Title  Patient will improve FGA by 4 points to indicate significant improvement with balancing while ambulating and decrease of fall risk    Baseline  03/25/2018: 28/30    Time  6    Period  Weeks    Status  Achieved      PT LONG TERM GOAL #3   Title  Patient will be able to perform prolonged single leg stance >10sec with eyes closed to indicate improvement with static balance and decrease fall risk    Baseline  2 sec in standing EC; 03/25/2018: 3 sec; 04/29/2018: 4 sec; 08/21/2018: 5 sec; 09/19/2018: 6 sec ; 10/21/2018 3 sec; 11/21/2018 5 sec; 01/08/2019: 9 sec; 02/25/2019: 14sec    Time  4    Period  Weeks    Status  Achieved      PT LONG TERM GOAL #4   Title  Pt will improve 6 MWT >208ft to demonstrate improved cardiorespiratory fitness    Baseline  Deferred to next session: 04/29/2018; 05/01/2018: 1438ft; 08/21/2018: 1441ft; 09/19/2018: 1437ft; 10/21/2018 1621; 11/21/2018 deferred to next session 2/2  time.; 01/08/2019: 1480 ft; 02/25/2019: 1340ft    Time  4    Period  Weeks    Status  On-going      PT LONG TERM GOAL #5   Title  Patient will be able to run 247ft without stopping to better be able to return to recreational activites and address cardiovascular endurance.    Baseline  23ft; 02/25/2019: 53ft    Time  4    Period  Weeks    Status  On-going            Plan - 02/26/19 1015    Clinical Impression Statement  Patient has made  progress with long term goals with a greater ability to perform to perform SLS EC >10sec attaining that long term goal. And patient was able to jog for 50ft which is a significant improvement compared to the 53ft the previous session. However, patient demonstrates a worsening of her 99minWT with several instances of inability to perform dorsiflexion with foot progression. At the end of ambulation patient has LOB forward without injury and did not impact numbers. Patient's 64minWT decreased by ~116ft compared to the previous progress note. Talked with patient about potential of using a AFO and receiving a referral from her MD to further address this limitation as she has had x3 LOB in the past month which is significant considering no changes otherwise. Patient will benefit from further skilled therapy to return to prior level of function.    Personal Factors and Comorbidities  Past/Current Experience    Examination-Activity Limitations  Locomotion Level    Examination-Participation Restrictions  Community Activity    Stability/Clinical Decision Making  Stable/Uncomplicated    Rehab Potential  Good    Clinical Impairments Affecting Rehab Potential  (-) hx of MS; (+) good motivation    PT Frequency  2x / week    PT Duration  4 weeks    PT Treatment/Interventions  Moist Heat;Iontophoresis 4mg /ml Dexamethasone;Electrical Stimulation;Aquatic Therapy;Cryotherapy;Therapeutic activities;Neuromuscular re-education;Therapeutic exercise;Patient/family education;Dry  needling;Manual techniques;Passive range of motion;Balance training;Functional mobility training;Gait training;Stair training;Joint Manipulations    PT Next Visit Plan  Continue addressing static (eyes closed), dynamic balance exercises, plyometric power movements, as well as incorporating more dual tasking and coordination activities; update HEP    PT Home Exercise Plan  hip flexion in sitting for strengthening, hip flexion knee flexion in standing for balance    Consulted and Agree with Plan of  Care  Patient       Patient will benefit from skilled therapeutic intervention in order to improve the following deficits and impairments:  Pain, Decreased coordination, Impaired perceived functional ability, Increased fascial restricitons, Increased muscle spasms, Difficulty walking, Abnormal gait, Decreased balance, Decreased mobility, Impaired sensation  Visit Diagnosis: Difficulty in walking, not elsewhere classified  Muscle weakness (generalized)     Problem List There are no active problems to display for this patient.   [name], PT DPT 02/26/2019, 10:27 AM  [facility] [address], [place], [address] Phone: [phone]   Fax:  [phone]  Name: EVANNE BONDOC MRN: [id number] Date of Birth: Aug 02, 1970

## 2019-02-26 NOTE — Addendum Note (Signed)
Addended by: Blain Pais on: 02/26/2019 10:40 AM   Modules accepted: Orders

## 2019-03-05 ENCOUNTER — Ambulatory Visit: Payer: BC Managed Care – PPO

## 2019-03-05 ENCOUNTER — Other Ambulatory Visit: Payer: Self-pay

## 2019-03-05 DIAGNOSIS — R262 Difficulty in walking, not elsewhere classified: Secondary | ICD-10-CM

## 2019-03-05 DIAGNOSIS — M6281 Muscle weakness (generalized): Secondary | ICD-10-CM

## 2019-03-05 DIAGNOSIS — Z9181 History of falling: Secondary | ICD-10-CM | POA: Diagnosis not present

## 2019-03-05 NOTE — Therapy (Signed)
[facility] [address] [place], [address] Phone: [phone]   Fax:  [phone]  Physical Therapy Treatment  Patient Details  Name: Michelle Ruiz MRN: [id number] Date of Birth: [birth year] No data recorded  Encounter Date: 03/05/2019  PT End of Session - 03/05/19 1727    Visit Number  73    Number of Visits  65    Date for PT Re-Evaluation  04/08/19    Authorization Type  BCBS    PT Start Time  [id number]    PT Stop Time  1800    PT Time Calculation (min)  45 min    Activity Tolerance  Patient tolerated treatment well;No increased pain;Patient limited by fatigue    Behavior During Therapy  [facility] for tasks assessed/performed       Past Medical History:  Diagnosis Date  . Abnormal Pap smear of cervix   . Anxiety   . Kidney stones   . MS (multiple sclerosis) ([place])   . UTI (urinary tract infection)     Past Surgical History:  Procedure Laterality Date  . BREAST BIOPSY Right 2010   stereotactic biopsy/ neg/ dr.byrnett  . BREAST BIOPSY Left 2012   neg/dr. brynett  . CESAREAN SECTION  2008   RPH  . TONSILLECTOMY  2006    There were no vitals filed for this visit.  Subjective Assessment - 03/05/19 1721    Subjective  Patient state no major changes since the previous session. Patient states she has been walking with little to no difficulty.    Patient is accompained by:  Family member    Pertinent History  Patient reports history of falls x3 in the past 6 months secondary to drop foot on the L LE. PMH: Multiple sclerosis; elbow fracture of the R UE    Limitations  Standing;Walking    How long can you stand comfortably?  20 min    How long can you walk comfortably?  1 miles    Patient Stated Goals  To be more balanced and core strengthening     Currently in Pain?  No/denies          TREATMENT Therapeutic exercise Ambulation 1311ft with performing distractions during ambulation and focusing on use  of steppage pattern and dorsiflexion Hip swings in standing forward/side to side - 2 min in each direction Side stepping over 11" hurdle - x 25 Forward and backward stepping over 11" hurdle - x25 Heel raises/ toe raises - x 20 with UE support Semi tandem ball toss on airex for improvement of balance - -x 20 Performed exercises to improve steppage pattern of gait and improve ablility to improve foot clearance.    PT Education - 03/05/19 1725    Education Details  form/technique with exercise    Person(s) Educated  Patient    Methods  Explanation;Demonstration    Comprehension  Verbalized understanding;Returned demonstration          PT Long Term Goals - 02/25/19 1721      PT LONG TERM GOAL #1   Title  Patient will be independent with balance HEP to continue benefits of therapy after discharge.    Baseline  dependent with form/technique for balance exercise; 03/25/2018: Independent     Time  6    Period  Weeks    Status  Achieved      PT LONG TERM GOAL #2   Title  Patient will improve FGA by 4 points  to indicate significant improvement with balancing while ambulating and decrease of fall risk    Baseline  03/25/2018: 28/30    Time  6    Period  Weeks    Status  Achieved      PT LONG TERM GOAL #3   Title  Patient will be able to perform prolonged single leg stance >10sec with eyes closed to indicate improvement with static balance and decrease fall risk    Baseline  2 sec in standing EC; 03/25/2018: 3 sec; 04/29/2018: 4 sec; 08/21/2018: 5 sec; 09/19/2018: 6 sec ; 10/21/2018 3 sec; 11/21/2018 5 sec; 01/08/2019: 9 sec; 02/25/2019: 14sec    Time  4    Period  Weeks    Status  Achieved      PT LONG TERM GOAL #4   Title  Pt will improve 6 MWT >2061ft to demonstrate improved cardiorespiratory fitness    Baseline  Deferred to next session: 04/29/2018; 05/01/2018: 1422ft; 08/21/2018: 1444ft; 09/19/2018: 1446ft; 10/21/2018 1621; 11/21/2018 deferred to next session 2/2 time.; 01/08/2019: 1480 ft;  02/25/2019: 1322ft    Time  4    Period  Weeks    Status  On-going      PT LONG TERM GOAL #5   Title  Patient will be able to run 243ft without stopping to better be able to return to recreational activites and address cardiovascular endurance.    Baseline  38ft; 02/25/2019: 76ft    Time  4    Period  Weeks    Status  On-going            Plan - 03/05/19 1730    Clinical Impression Statement  Continued to focus on stepping patterns during today's session, patient demonstrates less spasticity with walking today which may be due to the increase in baclophen dose taken earlier. Taught steppage pattern with performing prolonged walking and with noticable "scuffing" with performance. Patient has difficulty with walking and continued to focus on improving stepping pattern. Patient will benefit from further skilled therapy to improve ability to walk.    Personal Factors and Comorbidities  Past/Current Experience    Examination-Activity Limitations  Locomotion Level    Examination-Participation Restrictions  Community Activity    Stability/Clinical Decision Making  Stable/Uncomplicated    Rehab Potential  Good    Clinical Impairments Affecting Rehab Potential  (-) hx of MS; (+) good motivation    PT Frequency  2x / week    PT Duration  4 weeks    PT Treatment/Interventions  Moist Heat;Iontophoresis 4mg /ml Dexamethasone;Electrical Stimulation;Aquatic Therapy;Cryotherapy;Therapeutic activities;Neuromuscular re-education;Therapeutic exercise;Patient/family education;Dry needling;Manual techniques;Passive range of motion;Balance training;Functional mobility training;Gait training;Stair training;Joint Manipulations    PT Next Visit Plan  Continue addressing static (eyes closed), dynamic balance exercises, plyometric power movements, as well as incorporating more dual tasking and coordination activities; update HEP    PT Home Exercise Plan  hip flexion in sitting for strengthening, hip flexion knee  flexion in standing for balance    Consulted and Agree with Plan of Care  Patient       Patient will benefit from skilled therapeutic intervention in order to improve the following deficits and impairments:  Pain, Decreased coordination, Impaired perceived functional ability, Increased fascial restricitons, Increased muscle spasms, Difficulty walking, Abnormal gait, Decreased balance, Decreased mobility, Impaired sensation  Visit Diagnosis: Difficulty in walking, not elsewhere classified  Muscle weakness (generalized)  History of falling     Problem List There are no active problems to display for this patient.  [name], PT DPT 03/05/2019, 6:53 PM  [facility] [address] [place], [address] Phone: [phone]   Fax:  [phone]  Name: SANA KEESEE MRN: [id number] Date of Birth: 1970-10-20

## 2019-03-07 DIAGNOSIS — H17821 Peripheral opacity of cornea, right eye: Secondary | ICD-10-CM | POA: Diagnosis not present

## 2019-03-11 ENCOUNTER — Other Ambulatory Visit: Payer: Self-pay

## 2019-03-11 ENCOUNTER — Ambulatory Visit: Payer: BC Managed Care – PPO

## 2019-03-11 DIAGNOSIS — R262 Difficulty in walking, not elsewhere classified: Secondary | ICD-10-CM

## 2019-03-11 DIAGNOSIS — Z9181 History of falling: Secondary | ICD-10-CM | POA: Diagnosis not present

## 2019-03-11 DIAGNOSIS — M6281 Muscle weakness (generalized): Secondary | ICD-10-CM

## 2019-03-11 NOTE — Therapy (Signed)
[facility] [address] [place], [address] Phone: [phone]   Fax:  [phone]  Physical Therapy Treatment  Patient Details  Name: Michelle Ruiz MRN: [id number] Date of Birth: 1970-06-10 No data recorded  Encounter Date: 03/11/2019  PT End of Session - 03/11/19 1809    Visit Number  72    Number of Visits  55    Date for PT Re-Evaluation  04/08/19    Authorization Type  BCBS    PT Start Time  [id number]    PT Stop Time  1800    PT Time Calculation (min)  45 min    Equipment Utilized During Treatment  Gait belt    Activity Tolerance  Patient tolerated treatment well;No increased pain;Patient limited by fatigue    Behavior During Therapy  [facility] for tasks assessed/performed       Past Medical History:  Diagnosis Date  . Abnormal Pap smear of cervix   . Anxiety   . Kidney stones   . MS (multiple sclerosis) ([place])   . UTI (urinary tract infection)     Past Surgical History:  Procedure Laterality Date  . BREAST BIOPSY Right 2010   stereotactic biopsy/ neg/ dr.byrnett  . BREAST BIOPSY Left 2012   neg/dr. brynett  . CESAREAN SECTION  2008   RPH  . TONSILLECTOMY  2006    There were no vitals filed for this visit.  Subjective Assessment - 03/11/19 1715    Subjective  Patient states no changes since last visit.    Patient is accompained by:  Family member    Pertinent History  Patient reports history of falls x3 in the past 6 months secondary to drop foot on the L LE. PMH: Multiple sclerosis; elbow fracture of the R UE    Limitations  Standing;Walking    How long can you stand comfortably?  20 min    How long can you walk comfortably?  1 miles    Patient Stated Goals  To be more balanced and core strengthening     Currently in Pain?  No/denies      TREATMENT  NMR Hurdles with dual tasking via visual distractions and obstacles x 700 ft one near-LOB to R  "Floor is lava" steps on balance stones 5  steps x 12 with dual tasking Hurdles backwards 7 x 10 reps with dual tasking - 3 x LOB with stepping strategy for righting Slider hip extensions R/L x 8 for dynamic balance with SLS Karaoke for working on step strategies 40 ft x 6 Karaoke with perturbations 40 ft x 4 Dynadisc step ups R/L x 10 - note challenge on LLE Tempo box steps with tempo increasing to 120 bpm tempo x 3 min with cues for reversing direction  NMR for improving stepping strategies and motor planning under dual task conditions for reduced fall risk PT Education - 03/11/19 1809    Education Details  form/technique with exercise    Person(s) Educated  Patient    Methods  Explanation;Demonstration    Comprehension  Verbalized understanding;Returned demonstration          PT Long Term Goals - 02/25/19 1721      PT LONG TERM GOAL #1   Title  Patient will be independent with balance HEP to continue benefits of therapy after discharge.    Baseline  dependent with form/technique for balance exercise; 03/25/2018: Independent     Time  6    Period  Weeks    Status  Achieved      PT LONG TERM GOAL #2   Title  Patient will improve FGA by 4 points to indicate significant improvement with balancing while ambulating and decrease of fall risk    Baseline  03/25/2018: 28/30    Time  6    Period  Weeks    Status  Achieved      PT LONG TERM GOAL #3   Title  Patient will be able to perform prolonged single leg stance >10sec with eyes closed to indicate improvement with static balance and decrease fall risk    Baseline  2 sec in standing EC; 03/25/2018: 3 sec; 04/29/2018: 4 sec; 08/21/2018: 5 sec; 09/19/2018: 6 sec ; 10/21/2018 3 sec; 11/21/2018 5 sec; 01/08/2019: 9 sec; 02/25/2019: 14sec    Time  4    Period  Weeks    Status  Achieved      PT LONG TERM GOAL #4   Title  Pt will improve 6 MWT >2086ft to demonstrate improved cardiorespiratory fitness    Baseline  Deferred to next session: 04/29/2018; 05/01/2018: 1460ft; 08/21/2018:  1452ft; 09/19/2018: 1455ft; 10/21/2018 1621; 11/21/2018 deferred to next session 2/2 time.; 01/08/2019: 1480 ft; 02/25/2019: 1311ft    Time  4    Period  Weeks    Status  On-going      PT LONG TERM GOAL #5   Title  Patient will be able to run 272ft without stopping to better be able to return to recreational activites and address cardiovascular endurance.    Baseline  90ft; 02/25/2019: 84ft    Time  4    Period  Weeks    Status  On-going            Plan - 03/11/19 1800    Clinical Impression Statement  Focus today on dual tasking with obstacles and various gait patterns to challenge balance and improve stepping strategy under a variety of conditions. Note mild deficit in movement quality with reduced fluidity with ambulation possibly secondary to use of comparatively novel steppage strategy but good motor planning evidenced by adaptability to quick direction changes in response to external stimulus. Multiple LOB with challenging exercises throughout session but patient performs stepping strategy adequately to maintain dynamic postural equilibrium without assistance. Patient will benefit from further skilled therapy to reduce fall risk with ambulation    Personal Factors and Comorbidities  Past/Current Experience    Examination-Activity Limitations  Locomotion Level    Examination-Participation Restrictions  Community Activity    Stability/Clinical Decision Making  Stable/Uncomplicated    Rehab Potential  Good    Clinical Impairments Affecting Rehab Potential  (-) hx of MS; (+) good motivation    PT Frequency  2x / week    PT Duration  4 weeks    PT Treatment/Interventions  Moist Heat;Iontophoresis 4mg /ml Dexamethasone;Electrical Stimulation;Aquatic Therapy;Cryotherapy;Therapeutic activities;Neuromuscular re-education;Therapeutic exercise;Patient/family education;Dry needling;Manual techniques;Passive range of motion;Balance training;Functional mobility training;Gait training;Stair  training;Joint Manipulations    PT Next Visit Plan  Continue addressing static (eyes closed), dynamic balance exercises, plyometric power movements, as well as incorporating more dual tasking and coordination activities; update HEP    PT Home Exercise Plan  hip flexion in sitting for strengthening, hip flexion knee flexion in standing for balance    Consulted and Agree with Plan of Care  Patient       Patient will benefit from skilled therapeutic intervention in order to improve the following deficits and impairments:  Pain, Decreased coordination, Impaired  perceived functional ability, Increased fascial restricitons, Increased muscle spasms, Difficulty walking, Abnormal gait, Decreased balance, Decreased mobility, Impaired sensation  Visit Diagnosis: Difficulty in walking, not elsewhere classified  Muscle weakness (generalized)  History of falling     Problem List There are no problems to display for this patient.   [name], SPT 03/12/2019, 10:14 AM  [facility] [address] [place], [address] Phone: [phone]   Fax:  [phone]  Name: Michelle Ruiz MRN: [id number] Date of Birth: 05-18-1970

## 2019-03-18 ENCOUNTER — Other Ambulatory Visit: Payer: Self-pay

## 2019-03-18 ENCOUNTER — Ambulatory Visit: Payer: BC Managed Care – PPO

## 2019-03-18 DIAGNOSIS — R262 Difficulty in walking, not elsewhere classified: Secondary | ICD-10-CM | POA: Diagnosis not present

## 2019-03-18 DIAGNOSIS — Z9181 History of falling: Secondary | ICD-10-CM | POA: Diagnosis not present

## 2019-03-18 DIAGNOSIS — M6281 Muscle weakness (generalized): Secondary | ICD-10-CM

## 2019-03-18 NOTE — Therapy (Signed)
[facility] [address], [place], [address] Phone: [phone]   Fax:  [phone]  Physical Therapy Treatment  Patient Details  Name: Michelle Ruiz MRN: [id number] Date of Birth: Jul 05, 1970 No data recorded  Encounter Date: 03/18/2019  PT End of Session - 03/18/19 1711    Visit Number  27    Number of Visits  45    Date for PT Re-Evaluation  04/08/19    Authorization Type  BCBS    PT Start Time  1700    PT Stop Time  1745    PT Time Calculation (min)  45 min    Equipment Utilized During Treatment  Gait belt    Activity Tolerance  Patient tolerated treatment well;No increased pain;Patient limited by fatigue    Behavior During Therapy  [facility] for tasks assessed/performed       Past Medical History:  Diagnosis Date  . Abnormal Pap smear of cervix   . Anxiety   . Kidney stones   . MS (multiple sclerosis) ([place])   . UTI (urinary tract infection)     Past Surgical History:  Procedure Laterality Date  . BREAST BIOPSY Right 2010   stereotactic biopsy/ neg/ dr.byrnett  . BREAST BIOPSY Left 2012   neg/dr. brynett  . CESAREAN SECTION  2008   RPH  . TONSILLECTOMY  2006    There were no vitals filed for this visit.  Subjective Assessment - 03/18/19 1703    Subjective  Patient states she was not able to get receive the walk-aide because of insurance not covering the expense.    Patient is accompained by:  Family member    Pertinent History  Patient reports history of falls x3 in the past 6 months secondary to drop foot on the L LE. PMH: Multiple sclerosis; elbow fracture of the R UE    Limitations  Standing;Walking    How long can you stand comfortably?  20 min    How long can you walk comfortably?  1 miles    Patient Stated Goals  To be more balanced and core strengthening     Currently in Pain?  No/denies         TREATMENT   Therapeutic Exercise Lunges in static - x 20 B Squats in static  standing - x 25 Hip swings forward/backward; laterally/medially - 30sec each side Ambulation with focus on steppage gait - 321ft Backward ambulation with focus on improving stride length Deadbug - x5 to address core weakness and to help maintain balance Side Bridge with knees bent in sidelying - -x 5 Therapeutic Exercise performed improve strength   NMR Stand feet together on dynadisc - 2 x 5 with ball toss Step ups onto large dynadisc - x3 B without UE during asending / UE support during decension Feet together balance on airex pad - x20 with ball toss NMR for improving stepping strategies and motor planning under dual task conditions for reduced fall risk    PT Education - 03/18/19 1711    Education Details  form/technique with exercise    Person(s) Educated  Patient    Methods  Explanation;Demonstration    Comprehension  Verbalized understanding;Returned demonstration          PT Long Term Goals - 02/25/19 1721      PT LONG TERM GOAL #1   Title  Patient will be independent with balance HEP to continue benefits of therapy after discharge.    Baseline  dependent with form/technique for balance exercise; 03/25/2018: Independent     Time  6    Period  Weeks    Status  Achieved      PT LONG TERM GOAL #2   Title  Patient will improve FGA by 4 points to indicate significant improvement with balancing while ambulating and decrease of fall risk    Baseline  03/25/2018: 28/30    Time  6    Period  Weeks    Status  Achieved      PT LONG TERM GOAL #3   Title  Patient will be able to perform prolonged single leg stance >10sec with eyes closed to indicate improvement with static balance and decrease fall risk    Baseline  2 sec in standing EC; 03/25/2018: 3 sec; 04/29/2018: 4 sec; 08/21/2018: 5 sec; 09/19/2018: 6 sec ; 10/21/2018 3 sec; 11/21/2018 5 sec; 01/08/2019: 9 sec; 02/25/2019: 14sec    Time  4    Period  Weeks    Status  Achieved      PT LONG TERM GOAL #4   Title  Pt will  improve 6 MWT >202ft to demonstrate improved cardiorespiratory fitness    Baseline  Deferred to next session: 04/29/2018; 05/01/2018: 1418ft; 08/21/2018: 141ft; 09/19/2018: 141ft; 10/21/2018 1621; 11/21/2018 deferred to next session 2/2 time.; 01/08/2019: 1480 ft; 02/25/2019: 1368ft    Time  4    Period  Weeks    Status  On-going      PT LONG TERM GOAL #5   Title  Patient will be able to run 222ft without stopping to better be able to return to recreational activites and address cardiovascular endurance.    Baseline  56ft; 02/25/2019: 61ft    Time  4    Period  Weeks    Status  On-going            Plan - 03/18/19 1753    Clinical Impression Statement  COntinued to focus on improving steppage gait with ambulation especially when patient begins to become fatigued after ~261ft of quick-paced ambulation. Patient also demonstrates poor core control which may be negatively affecting standing balance. Added sidebridges and deadbug marches to HEP to further address less limitatoin. Patient demonstrates poor balance with backwards amb with notable hip extension. Patient will benefit from further skileld therapy to return to prior level of function.    Personal Factors and Comorbidities  Past/Current Experience    Examination-Activity Limitations  Locomotion Level    Examination-Participation Restrictions  Community Activity    Stability/Clinical Decision Making  Stable/Uncomplicated    Rehab Potential  Good    Clinical Impairments Affecting Rehab Potential  (-) hx of MS; (+) good motivation    PT Frequency  2x / week    PT Duration  4 weeks    PT Treatment/Interventions  Moist Heat;Iontophoresis 4mg /ml Dexamethasone;Electrical Stimulation;Aquatic Therapy;Cryotherapy;Therapeutic activities;Neuromuscular re-education;Therapeutic exercise;Patient/family education;Dry needling;Manual techniques;Passive range of motion;Balance training;Functional mobility training;Gait training;Stair training;Joint  Manipulations    PT Next Visit Plan  Continue addressing static (eyes closed), dynamic balance exercises, plyometric power movements, as well as incorporating more dual tasking and coordination activities; update HEP    PT Home Exercise Plan  hip flexion in sitting for strengthening, hip flexion knee flexion in standing for balance    Consulted and Agree with Plan of Care  Patient       Patient will benefit from skilled therapeutic intervention in order to improve the following deficits and impairments:  Pain, Decreased coordination, Impaired perceived  functional ability, Increased fascial restricitons, Increased muscle spasms, Difficulty walking, Abnormal gait, Decreased balance, Decreased mobility, Impaired sensation  Visit Diagnosis: Difficulty in walking, not elsewhere classified  Muscle weakness (generalized)     Problem List There are no problems to display for this patient.   [name], PT DPT 03/18/2019, 5:57 PM  [facility] [address] [place], [address] Phone: [phone]   Fax:  [phone]  Name: Michelle Ruiz MRN: [id number] Date of Birth: 18-Jun-1970

## 2019-03-24 ENCOUNTER — Ambulatory Visit: Payer: BC Managed Care – PPO

## 2019-03-24 ENCOUNTER — Other Ambulatory Visit: Payer: Self-pay

## 2019-03-24 DIAGNOSIS — M6281 Muscle weakness (generalized): Secondary | ICD-10-CM | POA: Diagnosis not present

## 2019-03-24 DIAGNOSIS — R262 Difficulty in walking, not elsewhere classified: Secondary | ICD-10-CM

## 2019-03-24 DIAGNOSIS — Z9181 History of falling: Secondary | ICD-10-CM

## 2019-03-24 NOTE — Therapy (Signed)
[facility] [address] [place], [address] Phone: [phone]   Fax:  [phone]  Physical Therapy Treatment  Patient Details  Name: Michelle Ruiz MRN: [id number] Date of Birth: [birth year] No data recorded  Encounter Date: 03/24/2019  PT End of Session - 03/24/19 1904    Visit Number  39    Number of Visits  78    Date for PT Re-Evaluation  04/08/19    Authorization Type  BCBS    PT Start Time  1820    PT Stop Time  1904    PT Time Calculation (min)  44 min    Equipment Utilized During Treatment  Gait belt    Activity Tolerance  Patient tolerated treatment well;No increased pain;Patient limited by fatigue    Behavior During Therapy  [facility] for tasks assessed/performed       Past Medical History:  Diagnosis Date  . Abnormal Pap smear of cervix   . Anxiety   . Kidney stones   . MS (multiple sclerosis) ([place])   . UTI (urinary tract infection)     Past Surgical History:  Procedure Laterality Date  . BREAST BIOPSY Right 2010   stereotactic biopsy/ neg/ dr.byrnett  . BREAST BIOPSY Left 2012   neg/dr. brynett  . CESAREAN SECTION  2008   RPH  . TONSILLECTOMY  2006    There were no vitals filed for this visit.  Subjective Assessment - 03/24/19 1820    Subjective  Patient reports having a good Christmas weekend.    Patient is accompained by:  Family member    Pertinent History  Patient reports history of falls x3 in the past 6 months secondary to drop foot on the L LE. PMH: Multiple sclerosis; elbow fracture of the R UE    Limitations  Standing;Walking    How long can you stand comfortably?  20 min    How long can you walk comfortably?  1 miles    Patient Stated Goals  To be more balanced and core strengthening     Currently in Pain?  No/denies        TREATMENT  TE Lunges x 5 R/L  Back lunges x 5 R/L Backward ambulation over hurdles 15 ft x 8  Hurdle high steps with tempo 80 increasing to  90 BPM 15 ft x 12 Dynadisc with ball toss x 15 TVA activation with tactile feedback x 5 min Sit up eccentrics x 5 Dead bugs x 10 Seated BLE flexion ball between legs without UE assist x 15 for improved anterior core control Ambulation 600 ft - note fatigue with steppage gait after ~200 ft but improved gait kinematics with heel strike/loading response. Note increased lateral shift after ~500 ft indicating early fatigue.  TE for improved hip strength and core control for steppage gait and reduced fall risk     PT Education - 03/24/19 1918    Education Details  form/technique with exercise    Person(s) Educated  Patient    Methods  Explanation;Demonstration;Verbal cues;Tactile cues    Comprehension  Verbalized understanding;Returned demonstration;Verbal cues required;Tactile cues required          PT Long Term Goals - 02/25/19 1721      PT LONG TERM GOAL #1   Title  Patient will be independent with balance HEP to continue benefits of therapy after discharge.    Baseline  dependent with form/technique for balance exercise; 03/25/2018: Independent  Time  6    Period  Weeks    Status  Achieved      PT LONG TERM GOAL #2   Title  Patient will improve FGA by 4 points to indicate significant improvement with balancing while ambulating and decrease of fall risk    Baseline  03/25/2018: 28/30    Time  6    Period  Weeks    Status  Achieved      PT LONG TERM GOAL #3   Title  Patient will be able to perform prolonged single leg stance >10sec with eyes closed to indicate improvement with static balance and decrease fall risk    Baseline  2 sec in standing EC; 03/25/2018: 3 sec; 04/29/2018: 4 sec; 08/21/2018: 5 sec; 09/19/2018: 6 sec ; 10/21/2018 3 sec; 11/21/2018 5 sec; 01/08/2019: 9 sec; 02/25/2019: 14sec    Time  4    Period  Weeks    Status  Achieved      PT LONG TERM GOAL #4   Title  Pt will improve 6 MWT >2028ft to demonstrate improved cardiorespiratory fitness    Baseline   Deferred to next session: 04/29/2018; 05/01/2018: 1478ft; 08/21/2018: 1455ft; 09/19/2018: 1455ft; 10/21/2018 1621; 11/21/2018 deferred to next session 2/2 time.; 01/08/2019: 1480 ft; 02/25/2019: 1384ft    Time  4    Period  Weeks    Status  On-going      PT LONG TERM GOAL #5   Title  Patient will be able to run 231ft without stopping to better be able to return to recreational activites and address cardiovascular endurance.    Baseline  81ft; 02/25/2019: 38ft    Time  4    Period  Weeks    Status  On-going            Plan - 03/24/19 1904    Clinical Impression Statement  Patient demonstrates improvements in steppage gait compensation for foot drop able to ambulate 600 ft at a moderate pace without scuffing. Patient demonstrates deficits in proprioception with anterior core exercises but is able to perform exercises with minimal compensations with tactile cueing. Hip extension component of push-off during gait is improved following intervention but gross hip weakness remains evident AEB performance in therapeutic exercises- said weakness influencing gait deviations and early fatigue. Patient will benefit from further skilled therapy to return to PLOF.    Personal Factors and Comorbidities  Past/Current Experience    Examination-Activity Limitations  Locomotion Level    Examination-Participation Restrictions  Community Activity    Stability/Clinical Decision Making  Stable/Uncomplicated    Rehab Potential  Good    Clinical Impairments Affecting Rehab Potential  (-) hx of MS; (+) good motivation    PT Frequency  2x / week    PT Duration  4 weeks    PT Treatment/Interventions  Moist Heat;Iontophoresis 4mg /ml Dexamethasone;Electrical Stimulation;Aquatic Therapy;Cryotherapy;Therapeutic activities;Neuromuscular re-education;Therapeutic exercise;Patient/family education;Dry needling;Manual techniques;Passive range of motion;Balance training;Functional mobility training;Gait training;Stair  training;Joint Manipulations    PT Next Visit Plan  Continue addressing static (eyes closed), dynamic balance exercises, plyometric power movements, as well as incorporating more dual tasking and coordination activities; update HEP    PT Home Exercise Plan  hip flexion in sitting for strengthening, hip flexion knee flexion in standing for balance    Consulted and Agree with Plan of Care  Patient       Patient will benefit from skilled therapeutic intervention in order to improve the following deficits and impairments:  Pain, Decreased coordination, Impaired perceived  functional ability, Increased fascial restricitons, Increased muscle spasms, Difficulty walking, Abnormal gait, Decreased balance, Decreased mobility, Impaired sensation  Visit Diagnosis: Difficulty in walking, not elsewhere classified  Muscle weakness (generalized)  History of falling     Problem List There are no problems to display for this patient.   [name], SPT 03/24/2019, 7:37 PM  [facility] [address], [place], [address] Phone: [phone]   Fax:  [phone]  Name: Michelle Ruiz MRN: [id number] Date of Birth: Dec 12, 1970

## 2019-03-31 ENCOUNTER — Ambulatory Visit: Payer: BC Managed Care – PPO | Attending: Neurology

## 2019-03-31 ENCOUNTER — Other Ambulatory Visit: Payer: Self-pay

## 2019-03-31 DIAGNOSIS — Z9181 History of falling: Secondary | ICD-10-CM | POA: Diagnosis present

## 2019-03-31 DIAGNOSIS — R262 Difficulty in walking, not elsewhere classified: Secondary | ICD-10-CM

## 2019-03-31 DIAGNOSIS — M6281 Muscle weakness (generalized): Secondary | ICD-10-CM | POA: Insufficient documentation

## 2019-04-01 NOTE — Therapy (Signed)
[place] [facility] [address], [place], [address] Phone: [phone]   Fax:  [phone]  Physical Therapy Treatment  Patient Details  Name: Michelle Ruiz MRN: [id number] Date of Birth: [birth year] No data recorded  Encounter Date: 03/31/2019  PT End of Session - 03/31/19 1741    Visit Number  75    Number of Visits  70    Date for PT Re-Evaluation  04/08/19    Authorization Type  BCBS (5/10)    PT Start Time  [id number]    PT Stop Time  1800    PT Time Calculation (min)  45 min    Equipment Utilized During Treatment  Gait belt    Activity Tolerance  Patient tolerated treatment well;No increased pain;Patient limited by fatigue    Behavior During Therapy  [facility] for tasks assessed/performed       Past Medical History:  Diagnosis Date  . Abnormal Pap smear of cervix   . Anxiety   . Kidney stones   . MS (multiple sclerosis) ([place])   . UTI (urinary tract infection)     Past Surgical History:  Procedure Laterality Date  . BREAST BIOPSY Right 2010   stereotactic biopsy/ neg/ dr.byrnett  . BREAST BIOPSY Left 2012   neg/dr. brynett  . CESAREAN SECTION  2008   RPH  . TONSILLECTOMY  2006    There were no vitals filed for this visit.  Subjective Assessment - 03/31/19 1728    Subjective  Patient states she brought in an AFO to try to wear to decrease the poor foot clearance with ambulation.    Patient is accompained by:  Family member    Pertinent History  Patient reports history of falls x3 in the past 6 months secondary to drop foot on the L LE. PMH: Multiple sclerosis; elbow fracture of the R UE    Limitations  Standing;Walking    How long can you stand comfortably?  20 min    How long can you walk comfortably?  1 miles    Patient Stated Goals  To be more balanced and core strengthening     Currently in Pain?  No/denies    Pain Onset  More than a month ago         TREATMENT Therapeutic Exercise Running man  with intermittent UE support - x 20  Ambulation and education with ambulating with AFO brace - x 112ft Hip swings forward/backward - x 20 B Hip lateral swings --  x20 B Dead bugs with focus on core activation - x20 with performing self-tactile cueing to "feel" activation Semi tandem stance on airex pad with ball toss - x 20 with focus on improving static balance and with ball throws outside of BOS Single leg stance with Diona Foley toss - x 10 B Increased difficulty with performance unable to perform >2 consectuatively Trust fall exercises to address ability to step out to maintain balance - x 20  Exercises performed to improve LE strength, static/dynamic balance with activity    PT Education - 03/31/19 1736    Education Details  form/technique with exercise    Person(s) Educated  Patient    Methods  Explanation;Demonstration    Comprehension  Verbalized understanding;Returned demonstration          PT Long Term Goals - 02/25/19 1721      PT LONG TERM GOAL #1   Title  Patient will be independent with balance HEP to continue benefits  of therapy after discharge.    Baseline  dependent with form/technique for balance exercise; 03/25/2018: Independent     Time  6    Period  Weeks    Status  Achieved      PT LONG TERM GOAL #2   Title  Patient will improve FGA by 4 points to indicate significant improvement with balancing while ambulating and decrease of fall risk    Baseline  03/25/2018: 28/30    Time  6    Period  Weeks    Status  Achieved      PT LONG TERM GOAL #3   Title  Patient will be able to perform prolonged single leg stance >10sec with eyes closed to indicate improvement with static balance and decrease fall risk    Baseline  2 sec in standing EC; 03/25/2018: 3 sec; 04/29/2018: 4 sec; 08/21/2018: 5 sec; 09/19/2018: 6 sec ; 10/21/2018 3 sec; 11/21/2018 5 sec; 01/08/2019: 9 sec; 02/25/2019: 14sec    Time  4    Period  Weeks    Status  Achieved      PT LONG TERM GOAL #4   Title  Pt  will improve 6 MWT >2032ft to demonstrate improved cardiorespiratory fitness    Baseline  Deferred to next session: 04/29/2018; 05/01/2018: 1485ft; 08/21/2018: 1473ft; 09/19/2018: 1447ft; 10/21/2018 1621; 11/21/2018 deferred to next session 2/2 time.; 01/08/2019: 1480 ft; 02/25/2019: 1343ft    Time  4    Period  Weeks    Status  On-going      PT LONG TERM GOAL #5   Title  Patient will be able to run 271ft without stopping to better be able to return to recreational activites and address cardiovascular endurance.    Baseline  79ft; 02/25/2019: 37ft    Time  4    Period  Weeks    Status  On-going            Plan - 03/31/19 1746    Clinical Impression Statement  Patient demonstrates good ambulation quality with AFO brace requiring little to no cueing to use. Educated patient to begin using brace for longer walks to reduce decreased foot clearance with follow through. Continued to foucs on dynamic balance and dynamic movements that mimic ambulation to better enhance ambulation performance. Patient fatigues quickly with exercises but demonstrates improvement with foot clearance compared to previous sessions. Patient will benefit from further skilled therapy.    Personal Factors and Comorbidities  Past/Current Experience    Examination-Activity Limitations  Locomotion Level    Examination-Participation Restrictions  Community Activity    Stability/Clinical Decision Making  Stable/Uncomplicated    Rehab Potential  Good    Clinical Impairments Affecting Rehab Potential  (-) hx of MS; (+) good motivation    PT Frequency  2x / week    PT Duration  4 weeks    PT Treatment/Interventions  Moist Heat;Iontophoresis 4mg /ml Dexamethasone;Electrical Stimulation;Aquatic Therapy;Cryotherapy;Therapeutic activities;Neuromuscular re-education;Therapeutic exercise;Patient/family education;Dry needling;Manual techniques;Passive range of motion;Balance training;Functional mobility training;Gait training;Stair  training;Joint Manipulations    PT Next Visit Plan  Continue addressing static (eyes closed), dynamic balance exercises, plyometric power movements, as well as incorporating more dual tasking and coordination activities; update HEP    PT Home Exercise Plan  hip flexion in sitting for strengthening, hip flexion knee flexion in standing for balance    Consulted and Agree with Plan of Care  Patient       Patient will benefit from skilled therapeutic intervention in order to improve the following  deficits and impairments:  Pain, Decreased coordination, Impaired perceived functional ability, Increased fascial restricitons, Increased muscle spasms, Difficulty walking, Abnormal gait, Decreased balance, Decreased mobility, Impaired sensation  Visit Diagnosis: Difficulty in walking, not elsewhere classified  Muscle weakness (generalized)     Problem List There are no problems to display for this patient.   [name], PT DPT 04/01/2019, 9:13 AM  [place] [facility] [address], [place], [address] Phone: [phone]   Fax:  [phone]  Name: QUANTA RISDON MRN: [id number] Date of Birth: 04/24/1970

## 2019-04-07 ENCOUNTER — Ambulatory Visit: Payer: BC Managed Care – PPO

## 2019-04-10 ENCOUNTER — Ambulatory Visit: Payer: BC Managed Care – PPO

## 2019-04-10 ENCOUNTER — Other Ambulatory Visit: Payer: Self-pay

## 2019-04-10 DIAGNOSIS — M6281 Muscle weakness (generalized): Secondary | ICD-10-CM

## 2019-04-10 DIAGNOSIS — R262 Difficulty in walking, not elsewhere classified: Secondary | ICD-10-CM | POA: Diagnosis not present

## 2019-04-10 DIAGNOSIS — Z9181 History of falling: Secondary | ICD-10-CM

## 2019-04-10 NOTE — Therapy (Signed)
[place] [facility] [address] [place], [address] Phone: [phone]   Fax:  [phone]  Physical Therapy Treatment  Patient Details  Name: CORDASIA STEUART MRN: [id number] Date of Birth: 09-22-1970 No data recorded  Encounter Date: 04/10/2019  PT End of Session - 04/10/19 1814    Visit Number  3    Number of Visits  63    Date for PT Re-Evaluation  04/08/19    Authorization Type  BCBS (5/10)    PT Start Time  1716    PT Stop Time  1800    PT Time Calculation (min)  44 min    Equipment Utilized During Treatment  Gait belt    Activity Tolerance  Patient tolerated treatment well;No increased pain;Patient limited by fatigue    Behavior During Therapy  [facility] for tasks assessed/performed       Past Medical History:  Diagnosis Date  . Abnormal Pap smear of cervix   . Anxiety   . Kidney stones   . MS (multiple sclerosis) ([place])   . UTI (urinary tract infection)     Past Surgical History:  Procedure Laterality Date  . BREAST BIOPSY Right 2010   stereotactic biopsy/ neg/ dr.byrnett  . BREAST BIOPSY Left 2012   neg/dr. brynett  . CESAREAN SECTION  2008   RPH  . TONSILLECTOMY  2006    There were no vitals filed for this visit.  Subjective Assessment - 04/10/19 1720    Subjective  Patient reports to PT without AFO saying it did not help her avoid scuffing foot when walking. Reports ankle pain over last couple days.    Patient is accompained by:  Family member    Pertinent History  Patient reports history of falls x3 in the past 6 months secondary to drop foot on the L LE. PMH: Multiple sclerosis; elbow fracture of the R UE    Limitations  Standing;Walking    How long can you stand comfortably?  20 min    How long can you walk comfortably?  1 miles    Patient Stated Goals  To be more balanced and core strengthening     Currently in Pain?  No/denies    Pain Onset  More than a month ago        [facility] PT  Assessment - 04/10/19 0001      Functional Gait  Assessment   Gait Level Surface  Walks 20 ft in less than 5.5 sec, no assistive devices, good speed, no evidence for imbalance, normal gait pattern, deviates no more than 6 in outside of the 12 in walkway width.    Change in Gait Speed  Makes only minor adjustments to walking speed, or accomplishes a change in speed with significant gait deviations, deviates 10-15 in outside the 12 in walkway width, or changes speed but loses balance but is able to recover and continue walking.    Gait with Horizontal Head Turns  Performs head turns smoothly with no change in gait. Deviates no more than 6 in outside 12 in walkway width    Gait with Vertical Head Turns  Performs head turns with no change in gait. Deviates no more than 6 in outside 12 in walkway width.    Gait and Pivot Turn  Pivot turns safely in greater than 3 sec and stops with no loss of balance, or pivot turns safely within 3 sec and stops with mild imbalance, requires small steps to  catch balance.    Step Over Obstacle  Is able to step over one shoe box (4.5 in total height) without changing gait speed. No evidence of imbalance.    Gait with Narrow Base of Support  Ambulates 4-7 steps.    Gait with Eyes Closed  Walks 20 ft, uses assistive device, slower speed, mild gait deviations, deviates 6-10 in outside 12 in walkway width. Ambulates 20 ft in less than 9 sec but greater than 7 sec.    Ambulating Backwards  Walks 20 ft, uses assistive device, slower speed, mild gait deviations, deviates 6-10 in outside 12 in walkway width.    Steps  Alternating feet, no rail.    Total Score  22      TREATMENT  MMT Hip flexion  R 4- L 3+ Knee ext R 5- L 4+  SLR 60 deg on L, WNL R  Note minor clonic response with quick stretch to R ankle  6MWT 1400  TUG-C 9.0 s 9.5 s   FGA: 22  Pt c/o L ankle pain over achilles likely due to insufficient DF ROM for stair descent   Calf stretch L 2 x 30 sec -  cues for excursion and internal feedback Standing marches without UE support x 20 R/L Standing hip flexion followed by knee extension and slow eccentric lower x 5 R/L with UE support for balance  TE for strengthening and assessment for improved gait PT Education - 04/10/19 1814    Education Details  form/technique with exercise; calf stretch; requirement of hip flexion strength for gait    Person(s) Educated  Patient    Methods  Explanation;Demonstration;Verbal cues    Comprehension  Verbalized understanding;Returned demonstration;Verbal cues required          PT Long Term Goals - 04/10/19 1810      PT LONG TERM GOAL #1   Title  Patient will be independent with balance HEP to continue benefits of therapy after discharge.    Baseline  dependent with form/technique for balance exercise; 03/25/2018: Independent     Time  6    Period  Weeks    Status  Achieved      PT LONG TERM GOAL #2   Title  Patient will improve FGA by 4 points to indicate significant improvement with balancing while ambulating and decrease of fall risk    Baseline  03/25/2018: 28/30; 04/10/2019: 22    Time  6    Period  Weeks    Status  On-going    Target Date  05/22/19      PT LONG TERM GOAL #3   Title  Patient will be able to perform prolonged single leg stance >10sec with eyes closed to indicate improvement with static balance and decrease fall risk    Baseline  2 sec in standing EC; 03/25/2018: 3 sec; 04/29/2018: 4 sec; 08/21/2018: 5 sec; 09/19/2018: 6 sec ; 10/21/2018 3 sec; 11/21/2018 5 sec; 01/08/2019: 9 sec; 02/25/2019: 14sec    Time  4    Period  Weeks    Status  Achieved      PT LONG TERM GOAL #4   Title  Pt will improve 6 MWT >2073ft to demonstrate improved cardiorespiratory fitness    Baseline  Deferred to next session: 04/29/2018; 05/01/2018: 1460ft; 08/21/2018: 1446ft; 09/19/2018: 1429ft; 10/21/2018 1621; 11/21/2018 deferred to next session 2/2 time.; 01/08/2019: 1480 ft; 02/25/2019: 1388ft; 04/10/2019: 1400  ft    Time  4    Period  [place]  Status  On-going    Target Date  05/22/19      PT LONG TERM GOAL #5   Title  Patient will be able to run 258ft without stopping to better be able to return to recreational activites and address cardiovascular endurance.    Baseline  68ft; 02/25/2019: 51ft; 04/10/2019 deferred    Time  4    Period  Weeks    Status  On-going    Target Date  05/22/19      Additional Long Term Goals   Additional Long Term Goals  Yes      PT LONG TERM GOAL #6   Title  Patient will improve L hip flexion strength to 4/5 for improved steppage gait and reduced fall risk.    Baseline  04/10/2019: L hip flexion 3+/5    Time  6    Period  Weeks    Status  New    Target Date  05/22/19            Plan - 04/10/19 1822    Clinical Impression Statement  Reassessment: poor strength in L hip flexion noted, likely contributory to poor foot clearance with gait and contributing to fall risk. Improvements noted in 6MWT but short of goal. TUG-C results WNL but FGA reassessment demonstrates deficits compared to one year prior. Note minor clonic response with quick stretch to R ankle. Plan to focus on hip strengthening and dual tasking for improved steppage gait and stair gait. Patient will benefit from skilled physical therapy to reduce fall risk.    Personal Factors and Comorbidities  Past/Current Experience    Examination-Activity Limitations  Locomotion Level    Examination-Participation Restrictions  Community Activity    Stability/Clinical Decision Making  Stable/Uncomplicated    Rehab Potential  Good    Clinical Impairments Affecting Rehab Potential  (-) hx of MS; (+) good motivation    PT Frequency  2x / week    PT Duration  4 weeks    PT Treatment/Interventions  Moist Heat;Iontophoresis 4mg /ml Dexamethasone;Electrical Stimulation;Aquatic Therapy;Cryotherapy;Therapeutic activities;Neuromuscular re-education;Therapeutic exercise;Patient/family education;Dry needling;Manual  techniques;Passive range of motion;Balance training;Functional mobility training;Gait training;Stair training;Joint Manipulations    PT Next Visit Plan  Continue addressing static (eyes closed), dynamic balance exercises, plyometric power movements, as well as incorporating more dual tasking and coordination activities; update HEP    PT Home Exercise Plan  hip flexion in sitting for strengthening, hip flexion knee flexion in standing for balance    Consulted and Agree with Plan of Care  Patient       Patient will benefit from skilled therapeutic intervention in order to improve the following deficits and impairments:  Pain, Decreased coordination, Impaired perceived functional ability, Increased fascial restricitons, Increased muscle spasms, Difficulty walking, Abnormal gait, Decreased balance, Decreased mobility, Impaired sensation  Visit Diagnosis: Difficulty in walking, not elsewhere classified  Muscle weakness (generalized)  History of falling     Problem List There are no problems to display for this patient.   [name], SPT 04/10/2019, 6:27 PM  [place] [facility] [address], [place], [address] Phone: [phone]   Fax:  [phone]  Name: HARIKLIA CHAMBLESS MRN: [id number] Date of Birth: March 08, 1971

## 2019-04-14 ENCOUNTER — Other Ambulatory Visit: Payer: Self-pay

## 2019-04-14 ENCOUNTER — Ambulatory Visit: Payer: BC Managed Care – PPO

## 2019-04-14 DIAGNOSIS — R262 Difficulty in walking, not elsewhere classified: Secondary | ICD-10-CM | POA: Diagnosis not present

## 2019-04-14 DIAGNOSIS — Z9181 History of falling: Secondary | ICD-10-CM

## 2019-04-14 DIAGNOSIS — M6281 Muscle weakness (generalized): Secondary | ICD-10-CM

## 2019-04-14 NOTE — Therapy (Signed)
[place] [facility] [address], [place], [address] Phone: [phone]   Fax:  [phone]  Physical Therapy Treatment  Patient Details  Name: Michelle Ruiz MRN: [id number] Date of Birth: 10-22-1970 No data recorded  Encounter Date: 04/14/2019  PT End of Session - 04/14/19 1740    Visit Number  89    Number of Visits  85    Date for PT Re-Evaluation  05/22/19    Authorization Type  BCBS (7/10)    PT Start Time  [id number]    PT Stop Time  1800    PT Time Calculation (min)  45 min    Equipment Utilized During Treatment  Gait belt    Activity Tolerance  Patient tolerated treatment well;No increased pain;Patient limited by fatigue    Behavior During Therapy  [facility] for tasks assessed/performed       Past Medical History:  Diagnosis Date  . Abnormal Pap smear of cervix   . Anxiety   . Kidney stones   . MS (multiple sclerosis) ([place])   . UTI (urinary tract infection)     Past Surgical History:  Procedure Laterality Date  . BREAST BIOPSY Right 2010   stereotactic biopsy/ neg/ dr.byrnett  . BREAST BIOPSY Left 2012   neg/dr. brynett  . CESAREAN SECTION  2008   RPH  . TONSILLECTOMY  2006    There were no vitals filed for this visit.  Subjective Assessment - 04/14/19 1728    Subjective  Patient reports her ankle on the left siode continues to bother her. Patient states she has been scoffing her foot less compared to previous session.    Patient is accompained by:  Family member    Pertinent History  Patient reports history of falls x3 in the past 6 months secondary to drop foot on the L LE. PMH: Multiple sclerosis; elbow fracture of the R UE    Limitations  Standing;Walking    How long can you stand comfortably?  20 min    How long can you walk comfortably?  1 miles    Patient Stated Goals  To be more balanced and core strengthening     Currently in Pain?  No/denies    Pain Onset  More than a month ago        TREATMENT Therapeutic Exercise Hip swings forward/backward - x 20 B Hip lateral swings --  x20 B Hip flexor activation in standing with blue band - x 10 B Heel raises off of 6" step with B UE support - 2 x 10  Sitting Hip flexor activation with BTB - x 10  Reverse lunges in standing - x10 Exercises performed to improve LE strength, static/dynamic balance with activity   PT Education - 04/14/19 1739    Education Details  form/technique with exercise; heel raises off of step    Person(s) Educated  Patient    Methods  Explanation;Demonstration    Comprehension  Verbalized understanding;Returned demonstration          PT Long Term Goals - 04/10/19 1810      PT LONG TERM GOAL #1   Title  Patient will be independent with balance HEP to continue benefits of therapy after discharge.    Baseline  dependent with form/technique for balance exercise; 03/25/2018: Independent     Time  6    Period  Weeks    Status  Achieved      PT LONG TERM GOAL #2  Title  Patient will improve FGA by 4 points to indicate significant improvement with balancing while ambulating and decrease of fall risk    Baseline  03/25/2018: 28/30; 04/10/2019: 22    Time  6    Period  Weeks    Status  On-going    Target Date  05/22/19      PT LONG TERM GOAL #3   Title  Patient will be able to perform prolonged single leg stance >10sec with eyes closed to indicate improvement with static balance and decrease fall risk    Baseline  2 sec in standing EC; 03/25/2018: 3 sec; 04/29/2018: 4 sec; 08/21/2018: 5 sec; 09/19/2018: 6 sec ; 10/21/2018 3 sec; 11/21/2018 5 sec; 01/08/2019: 9 sec; 02/25/2019: 14sec    Time  4    Period  Weeks    Status  Achieved      PT LONG TERM GOAL #4   Title  Pt will improve 6 MWT >2054ft to demonstrate improved cardiorespiratory fitness    Baseline  Deferred to next session: 04/29/2018; 05/01/2018: 144ft; 08/21/2018: 1455ft; 09/19/2018: 1485ft; 10/21/2018 1621; 11/21/2018 deferred to next session  2/2 time.; 01/08/2019: 1480 ft; 02/25/2019: 1354ft; 04/10/2019: 1400 ft    Time  4    Period  Weeks    Status  On-going    Target Date  05/22/19      PT LONG TERM GOAL #5   Title  Patient will be able to run 284ft without stopping to better be able to return to recreational activites and address cardiovascular endurance.    Baseline  70ft; 02/25/2019: 36ft; 04/10/2019 deferred    Time  4    Period  Weeks    Status  On-going    Target Date  05/22/19      Additional Long Term Goals   Additional Long Term Goals  Yes      PT LONG TERM GOAL #6   Title  Patient will improve L hip flexion strength to 4/5 for improved steppage gait and reduced fall risk.    Baseline  04/10/2019: L hip flexion 3+/5    Time  6    Period  Weeks    Status  New    Target Date  05/22/19            Plan - 04/14/19 1746    Clinical Impression Statement  Focused on improving hip flexor activation and strength to improve steppage gait in standing to decrease fall risk and improve clearance on the L side during swing phase. Patient continues to demonstrate LE weakness with is most notable with performing functional movemonts with lunges and squats. Patient's limitations are contributing to a great fall risk and patient will benefit from further skilled therapy to return to prior level of function.    Personal Factors and Comorbidities  Past/Current Experience    Examination-Activity Limitations  Locomotion Level    Examination-Participation Restrictions  Community Activity    Stability/Clinical Decision Making  Stable/Uncomplicated    Rehab Potential  Good    Clinical Impairments Affecting Rehab Potential  (-) hx of MS; (+) good motivation    PT Frequency  2x / week    PT Duration  4 weeks    PT Treatment/Interventions  Moist Heat;Iontophoresis 4mg /ml Dexamethasone;Electrical Stimulation;Aquatic Therapy;Cryotherapy;Therapeutic activities;Neuromuscular re-education;Therapeutic exercise;Patient/family education;Dry  needling;Manual techniques;Passive range of motion;Balance training;Functional mobility training;Gait training;Stair training;Joint Manipulations    PT Next Visit Plan  Continue addressing static (eyes closed), dynamic balance exercises, plyometric power movements, as well as  incorporating more dual tasking and coordination activities; update HEP    PT Home Exercise Plan  hip flexion in sitting for strengthening, hip flexion knee flexion in standing for balance    Consulted and Agree with Plan of Care  Patient       Patient will benefit from skilled therapeutic intervention in order to improve the following deficits and impairments:  Pain, Decreased coordination, Impaired perceived functional ability, Increased fascial restricitons, Increased muscle spasms, Difficulty walking, Abnormal gait, Decreased balance, Decreased mobility, Impaired sensation  Visit Diagnosis: No diagnosis found.     Problem List There are no problems to display for this patient.   [name], PT DPT 04/14/2019, 6:10 PM  [place] [facility] [address], [place], [address] Phone: [phone]   Fax:  [phone]  Name: Michelle Ruiz MRN: [id number] Date of Birth: April 24, 1970

## 2019-04-21 ENCOUNTER — Other Ambulatory Visit: Payer: Self-pay

## 2019-04-21 ENCOUNTER — Ambulatory Visit: Payer: BC Managed Care – PPO

## 2019-04-21 DIAGNOSIS — Z9181 History of falling: Secondary | ICD-10-CM

## 2019-04-21 DIAGNOSIS — R262 Difficulty in walking, not elsewhere classified: Secondary | ICD-10-CM | POA: Diagnosis not present

## 2019-04-21 DIAGNOSIS — M6281 Muscle weakness (generalized): Secondary | ICD-10-CM

## 2019-04-21 NOTE — Therapy (Signed)
[facility] [address] [place], [address] Phone: [phone]   Fax:  [phone]  Physical Therapy Treatment  Patient Details  Name: Michelle Ruiz MRN: [id number] Date of Birth: February 17, 1971 No data recorded  Encounter Date: 04/21/2019  PT End of Session - 04/21/19 1929    Visit Number  78    Number of Visits  85    Date for PT Re-Evaluation  05/22/19    Authorization Type  BCBS (7/10)    PT Start Time  1815    PT Stop Time  1900    PT Time Calculation (min)  45 min    Equipment Utilized During Treatment  Gait belt    Activity Tolerance  Patient tolerated treatment well;No increased pain;Patient limited by fatigue    Behavior During Therapy  [facility] for tasks assessed/performed       Past Medical History:  Diagnosis Date  . Abnormal Pap smear of cervix   . Anxiety   . Kidney stones   . MS (multiple sclerosis) ([place])   . UTI (urinary tract infection)     Past Surgical History:  Procedure Laterality Date  . BREAST BIOPSY Right 2010   stereotactic biopsy/ neg/ dr.byrnett  . BREAST BIOPSY Left 2012   neg/dr. brynett  . CESAREAN SECTION  2008   RPH  . TONSILLECTOMY  2006    There were no vitals filed for this visit.  Subjective Assessment - 04/21/19 1915    Subjective  Patient states she has not had any falls since she was here in PT last time.  She has been working on her hip flexion exercises.  She states they are challenging.    Patient is accompained by:  Family member    Pertinent History  Patient reports history of falls x3 in the past 6 months secondary to drop foot on the L LE. PMH: Multiple sclerosis; elbow fracture of the R UE    Limitations  Standing;Walking    How long can you stand comfortably?  20 min    How long can you walk comfortably?  1 miles    Patient Stated Goals  To be more balanced and core strengthening     Currently in Pain?  No/denies    Pain Score  0-No pain    Pain  Onset  More than a month ago          Today's Treatment:  Therapeutic Exercise: Eccentric chair squats 2x8; holding small ball 2x8 (focused on tapping hips to chair but not fully sitting before concentric motion) Standing hip flexion: L 2x15, R 1x15 (marches) Standing hip flexion + knee extension + eccentric lower to standing: L 2x8, R x10 Standing hip flexion with knee fully extended: L 2x10, R x15 Standing heel raises 2x15    Neuro Re-ed: (PT SBA with gait belt on patient during these exercises) Hurdle drills: 10 min  Forward stepping R and L, focused on hip flexion strategy  Lateral stepping R and L, focused on hip flexion strategy  Dual tasking (holding small physioball) while stepping forward/lateral over hurdles, focused on only briefly looking at feet during this task Alternate foot taps on step- eyes open and then eyes closed 3x10 ea   Therapeutic Exercise and Neuromuscular Re-ed for improved hip strength and core control, for steppage gait, for balance/safety during ambulation and dual task activities, and reduced fall risk        PT Education - 04/21/19 1922  Education Details  exercise technique/foot placement during exercise          PT Long Term Goals - 04/10/19 1810      PT LONG TERM GOAL #1   Title  Patient will be independent with balance HEP to continue benefits of therapy after discharge.    Baseline  dependent with form/technique for balance exercise; 03/25/2018: Independent     Time  6    Period  Weeks    Status  Achieved      PT LONG TERM GOAL #2   Title  Patient will improve FGA by 4 points to indicate significant improvement with balancing while ambulating and decrease of fall risk    Baseline  03/25/2018: 28/30; 04/10/2019: 22    Time  6    Period  Weeks    Status  On-going    Target Date  05/22/19      PT LONG TERM GOAL #3   Title  Patient will be able to perform prolonged single leg stance >10sec with eyes closed to indicate  improvement with static balance and decrease fall risk    Baseline  2 sec in standing EC; 03/25/2018: 3 sec; 04/29/2018: 4 sec; 08/21/2018: 5 sec; 09/19/2018: 6 sec ; 10/21/2018 3 sec; 11/21/2018 5 sec; 01/08/2019: 9 sec; 02/25/2019: 14sec    Time  4    Period  Weeks    Status  Achieved      PT LONG TERM GOAL #4   Title  Pt will improve 6 MWT >2028ft to demonstrate improved cardiorespiratory fitness    Baseline  Deferred to next session: 04/29/2018; 05/01/2018: 1464ft; 08/21/2018: 1473ft; 09/19/2018: 1413ft; 10/21/2018 1621; 11/21/2018 deferred to next session 2/2 time.; 01/08/2019: 1480 ft; 02/25/2019: 1396ft; 04/10/2019: 1400 ft    Time  4    Period  Weeks    Status  On-going    Target Date  05/22/19      PT LONG TERM GOAL #5   Title  Patient will be able to run 242ft without stopping to better be able to return to recreational activites and address cardiovascular endurance.    Baseline  44ft; 02/25/2019: 3ft; 04/10/2019 deferred    Time  4    Period  Weeks    Status  On-going    Target Date  05/22/19      Additional Long Term Goals   Additional Long Term Goals  Yes      PT LONG TERM GOAL #6   Title  Patient will improve L hip flexion strength to 4/5 for improved steppage gait and reduced fall risk.    Baseline  04/10/2019: L hip flexion 3+/5    Time  6    Period  Weeks    Status  New    Target Date  05/22/19            Plan - 04/21/19 1930    Clinical Impression Statement  Pt tolerated today's session well.  She was challenged to fatigue with standing squat exercise.  Pt continues to have more difficulty with active L hip flexion than R during exercises.  She had a few mild losses of balance during dual task hurdle exrcise today while pusing off L LE.  She was able to recover her balance using a posterior cross body stepping strategy.  Pt should continue to benefit from skilled PT to promote optimal functional movement strategies during standing and ambulation and to reduce fall risk.     Personal Factors and Comorbidities  Past/Current Experience    Examination-Activity Limitations  Locomotion Level    Examination-Participation Restrictions  Community Activity    Stability/Clinical Decision Making  Stable/Uncomplicated    Rehab Potential  Good    Clinical Impairments Affecting Rehab Potential  (-) hx of MS; (+) good motivation    PT Frequency  2x / week    PT Duration  4 weeks    PT Treatment/Interventions  Moist Heat;Iontophoresis 4mg /ml Dexamethasone;Electrical Stimulation;Aquatic Therapy;Cryotherapy;Therapeutic activities;Neuromuscular re-education;Therapeutic exercise;Patient/family education;Dry needling;Manual techniques;Passive range of motion;Balance training;Functional mobility training;Gait training;Stair training;Joint Manipulations    PT Next Visit Plan  Continue addressing static (eyes closed), dynamic balance exercises, plyometric power movements, as well as incorporating more dual tasking and coordination activities; update HEP    PT Home Exercise Plan  hip flexion in sitting for strengthening, hip flexion knee flexion in standing for balance    Consulted and Agree with Plan of Care  Patient       Patient will benefit from skilled therapeutic intervention in order to improve the following deficits and impairments:  Pain, Decreased coordination, Impaired perceived functional ability, Increased fascial restricitons, Increased muscle spasms, Difficulty walking, Abnormal gait, Decreased balance, Decreased mobility, Impaired sensation  Visit Diagnosis: Difficulty in walking, not elsewhere classified  Muscle weakness (generalized)  History of falling     Problem List There are no problems to display for this patient.   Pincus Badder 04/21/2019, 7:38 PM  Merdis Delay, PT, DPT Physical Therapist - [place] [facility] [address], [place], [address] Phone: [phone]    Fax:  [phone]  Name: Michelle Ruiz MRN: [id number] Date of Birth: 11-13-1970

## 2019-04-29 ENCOUNTER — Ambulatory Visit: Payer: BC Managed Care – PPO

## 2019-05-06 ENCOUNTER — Ambulatory Visit: Payer: BC Managed Care – PPO | Attending: Neurology

## 2019-05-06 ENCOUNTER — Other Ambulatory Visit: Payer: Self-pay

## 2019-05-06 DIAGNOSIS — R262 Difficulty in walking, not elsewhere classified: Secondary | ICD-10-CM | POA: Diagnosis present

## 2019-05-06 DIAGNOSIS — Z9181 History of falling: Secondary | ICD-10-CM | POA: Diagnosis present

## 2019-05-06 DIAGNOSIS — M6281 Muscle weakness (generalized): Secondary | ICD-10-CM | POA: Insufficient documentation

## 2019-05-06 NOTE — Therapy (Signed)
[facility] [address] [place], [address] Phone: [phone]   Fax:  [phone]  Physical Therapy Treatment  Patient Details  Name: Michelle Ruiz MRN: [id number] Date of Birth: 1970-05-10 No data recorded  Encounter Date: 05/06/2019  PT End of Session - 05/06/19 1838    Visit Number  32    Number of Visits  85    Date for PT Re-Evaluation  05/22/19    Authorization Type  BCBS (8/10)    PT Start Time  1700    PT Stop Time  1745    PT Time Calculation (min)  45 min    Equipment Utilized During Treatment  Gait belt    Activity Tolerance  Patient tolerated treatment well;No increased pain;Patient limited by fatigue    Behavior During Therapy  [facility] for tasks assessed/performed       Past Medical History:  Diagnosis Date  . Abnormal Pap smear of cervix   . Anxiety   . Kidney stones   . MS (multiple sclerosis) ([place])   . UTI (urinary tract infection)     Past Surgical History:  Procedure Laterality Date  . BREAST BIOPSY Right 2010   stereotactic biopsy/ neg/ dr.byrnett  . BREAST BIOPSY Left 2012   neg/dr. brynett  . CESAREAN SECTION  2008   RPH  . TONSILLECTOMY  2006    There were no vitals filed for this visit.  Subjective Assessment - 05/06/19 1832    Subjective  Patient states she has not fallen or stumbled since her last PT appointment.  She has been working on her HEP.  She has been walking and riding her stationary bike at home too in the evenings.  Pt is wearing a small soft ankle brace on L ankle for support after seeing her neurologist and being told she has plantar fasciitis.  The brace is helping.    Patient is accompained by:  Family member    Pertinent History  Patient reports history of falls x3 in the past 6 months secondary to drop foot on the L LE. PMH: Multiple sclerosis; elbow fracture of the R UE    Limitations  Standing;Walking    How long can you stand comfortably?  20 min     How long can you walk comfortably?  1 miles    Patient Stated Goals  To be more balanced and core strengthening     Pain Onset  More than a month ago        Treatment today:  *PT SBA, gait belt used for standing exercises today  Front Marches over 4 hurdles 4 x 71ft ea (R and then L LE leading)  Lateral Marches over 4 hurdles 4 x 53ft (R and then L LE leading)  4 square hurdle stepovers x 4 (leading with R LE, then reversed x4 leading with L)  Front step Ups (4 inch height): alternated step up/back down leading with R and leading with L  Standing Knee flexion 2 x 10: used mirror and PT tapping to cue mm on L LE   Lateral Step ups x 12 ea (step up and over)  Blue physio ball marches 2 x 12 ea: PT SBA behind pt, pt used single UE support for first 5 reps of each set  Reverse Lunges x 15 ea (holding single UE support)  Bilateral Standing Calf raises x 20 (limited motion into DF due to ankle brace)  TRX Squats x 10 (  chair behind patient)     PT Education - 05/06/19 1837    Education Details  activity pacing during session, exercise form    Person(s) Educated  Patient    Methods  Explanation;Tactile cues    Comprehension  Verbalized understanding;Returned demonstration          PT Long Term Goals - 04/10/19 1810      PT LONG TERM GOAL #1   Title  Patient will be independent with balance HEP to continue benefits of therapy after discharge.    Baseline  dependent with form/technique for balance exercise; 03/25/2018: Independent     Time  6    Period  Weeks    Status  Achieved      PT LONG TERM GOAL #2   Title  Patient will improve FGA by 4 points to indicate significant improvement with balancing while ambulating and decrease of fall risk    Baseline  03/25/2018: 28/30; 04/10/2019: 22    Time  6    Period  Weeks    Status  On-going    Target Date  05/22/19      PT LONG TERM GOAL #3   Title  Patient will be able to perform prolonged single leg stance >10sec with  eyes closed to indicate improvement with static balance and decrease fall risk    Baseline  2 sec in standing EC; 03/25/2018: 3 sec; 04/29/2018: 4 sec; 08/21/2018: 5 sec; 09/19/2018: 6 sec ; 10/21/2018 3 sec; 11/21/2018 5 sec; 01/08/2019: 9 sec; 02/25/2019: 14sec    Time  4    Period  Weeks    Status  Achieved      PT LONG TERM GOAL #4   Title  Pt will improve 6 MWT >2058ft to demonstrate improved cardiorespiratory fitness    Baseline  Deferred to next session: 04/29/2018; 05/01/2018: 1460ft; 08/21/2018: 1469ft; 09/19/2018: 1457ft; 10/21/2018 1621; 11/21/2018 deferred to next session 2/2 time.; 01/08/2019: 1480 ft; 02/25/2019: 1379ft; 04/10/2019: 1400 ft    Time  4    Period  Weeks    Status  On-going    Target Date  05/22/19      PT LONG TERM GOAL #5   Title  Patient will be able to run 259ft without stopping to better be able to return to recreational activites and address cardiovascular endurance.    Baseline  43ft; 02/25/2019: 65ft; 04/10/2019 deferred    Time  4    Period  Weeks    Status  On-going    Target Date  05/22/19      Additional Long Term Goals   Additional Long Term Goals  Yes      PT LONG TERM GOAL #6   Title  Patient will improve L hip flexion strength to 4/5 for improved steppage gait and reduced fall risk.    Baseline  04/10/2019: L hip flexion 3+/5    Time  6    Period  Weeks    Status  New    Target Date  05/22/19            Plan - 05/06/19 1839    Clinical Impression Statement  Pt required occasional rest breaks throughout session due to report of fatigue after more challenging/higher level standing exercises.  She did not have any major losses of balance requiring PT assistance to recover.  She does have difficulty with accurate foot placement during standing coordination/multi directional stepping exercises (hurdles/step ups).  L LE fatigues more quickly than R during hip  exercises.  She should continue to benefit from skilled PT to promote optimal, safe functional  movement strategies during standing and walking and to reduce fall risk.    Personal Factors and Comorbidities  Past/Current Experience    Examination-Activity Limitations  Locomotion Level    Examination-Participation Restrictions  Community Activity    Stability/Clinical Decision Making  Stable/Uncomplicated    Rehab Potential  Good    Clinical Impairments Affecting Rehab Potential  (-) hx of MS; (+) good motivation    PT Frequency  2x / week    PT Duration  4 weeks    PT Treatment/Interventions  Moist Heat;Iontophoresis 4mg /ml Dexamethasone;Electrical Stimulation;Aquatic Therapy;Cryotherapy;Therapeutic activities;Neuromuscular re-education;Therapeutic exercise;Patient/family education;Dry needling;Manual techniques;Passive range of motion;Balance training;Functional mobility training;Gait training;Stair training;Joint Manipulations    PT Next Visit Plan  Continue addressing static (eyes closed), dynamic balance exercises, plyometric power movements, as well as incorporating more dual tasking and coordination activities; update HEP    PT Home Exercise Plan  hip flexion in sitting for strengthening, hip flexion knee flexion in standing for balance    Consulted and Agree with Plan of Care  Patient       Patient will benefit from skilled therapeutic intervention in order to improve the following deficits and impairments:  Pain, Decreased coordination, Impaired perceived functional ability, Increased fascial restricitons, Increased muscle spasms, Difficulty walking, Abnormal gait, Decreased balance, Decreased mobility, Impaired sensation  Visit Diagnosis: Difficulty in walking, not elsewhere classified  Muscle weakness (generalized)  History of falling     Problem List There are no problems to display for this patient.   Pincus Badder 05/06/2019, 6:50 PM  Merdis Delay, PT, DPT   [facility] [address] [place], [address] Phone: [phone]   Fax:  [phone]  Name: Michelle Ruiz MRN: [id number] Date of Birth: [birth year]

## 2019-05-13 ENCOUNTER — Ambulatory Visit: Payer: BC Managed Care – PPO

## 2019-05-13 ENCOUNTER — Other Ambulatory Visit: Payer: Self-pay

## 2019-05-13 DIAGNOSIS — R262 Difficulty in walking, not elsewhere classified: Secondary | ICD-10-CM

## 2019-05-13 DIAGNOSIS — Z9181 History of falling: Secondary | ICD-10-CM

## 2019-05-13 DIAGNOSIS — M6281 Muscle weakness (generalized): Secondary | ICD-10-CM

## 2019-05-13 NOTE — Therapy (Signed)
[facility] [address] [place], [address] Phone: [phone]   Fax:  [phone]  Physical Therapy Treatment  Patient Details  Name: Michelle Ruiz MRN: [id number] Date of Birth: 1970-09-20 No data recorded  Encounter Date: 05/13/2019  PT End of Session - 05/13/19 1710    Visit Number  80    Number of Visits  34    Date for PT Re-Evaluation  05/22/19    Authorization Type  BCBS (9/10)    PT Start Time  1700    PT Stop Time  1747    PT Time Calculation (min)  47 min    Equipment Utilized During Treatment  Gait belt    Activity Tolerance  Patient tolerated treatment well;No increased pain;Patient limited by fatigue    Behavior During Therapy  [facility] for tasks assessed/performed       Past Medical History:  Diagnosis Date  . Abnormal Pap smear of cervix   . Anxiety   . Kidney stones   . MS (multiple sclerosis) ([place])   . UTI (urinary tract infection)     Past Surgical History:  Procedure Laterality Date  . BREAST BIOPSY Right 2010   stereotactic biopsy/ neg/ dr.byrnett  . BREAST BIOPSY Left 2012   neg/dr. brynett  . CESAREAN SECTION  2008   RPH  . TONSILLECTOMY  2006    There were no vitals filed for this visit.  Subjective Assessment - 05/13/19 1704    Subjective  Patient states that her ankle is feeling better today and is not wearing the brace. Patient has continued w/ walking and biking at home    Patient is accompained by:  Family member    Pertinent History  Patient reports history of falls x3 in the past 6 months secondary to drop foot on the L LE. PMH: Multiple sclerosis; elbow fracture of the R UE    Limitations  Standing;Walking    How long can you stand comfortably?  20 min    How long can you walk comfortably?  1 miles    Patient Stated Goals  To be more balanced and core strengthening     Currently in Pain?  No/denies    Pain Onset  More than a month ago        TREATMENT  TE 6 MWT = 1321 ft (PT CGA with gait belt throughout entire test) Patient walked at 2.5 mph  Interval Exercises 65minute on/1 minute off x 3 sets of each exercise TRX squats  Standing Marches w/ 3# wt Monster walks w/ RTB  Exercised within a 70%-75% of Predicted max HR (~128-129 bpm), O2 sat remained at 99% throughout   Therapeutic exercises performed to increase cardiorespiratory endurance and LE muscular endurance.    PT Education - 05/13/19 1708    Education Details  activity pacing during session, work to rest ratios w/ exercise    Person(s) Educated  Patient    Methods  Explanation;Demonstration;Tactile cues    Comprehension  Verbalized understanding;Returned demonstration;Verbal cues required          PT Long Term Goals - 05/13/19 1824      PT LONG TERM GOAL #1   Title  Patient will be independent with balance HEP to continue benefits of therapy after discharge.    Baseline  dependent with form/technique for balance exercise; 03/25/2018: Independent     Time  6    Period  Weeks    Status  Achieved      PT LONG TERM GOAL #2   Title  Patient will improve FGA by 4 points to indicate significant improvement with balancing while ambulating and decrease of fall risk    Baseline  03/25/2018: 28/30; 04/10/2019: 22    Time  6    Period  Weeks    Status  On-going      PT LONG TERM GOAL #3   Title  Patient will be able to perform prolonged single leg stance >10sec with eyes closed to indicate improvement with static balance and decrease fall risk    Baseline  2 sec in standing EC; 03/25/2018: 3 sec; 04/29/2018: 4 sec; 08/21/2018: 5 sec; 09/19/2018: 6 sec ; 10/21/2018 3 sec; 11/21/2018 5 sec; 01/08/2019: 9 sec; 02/25/2019: 14sec    Time  4    Period  Weeks    Status  Achieved      PT LONG TERM GOAL #4   Title  Pt will improve 6 MWT >2093ft to demonstrate improved cardiorespiratory fitness    Baseline  Deferred to next session: 04/29/2018; 05/01/2018: 1464ft;  08/21/2018: 1439ft; 09/19/2018: 1437ft; 10/21/2018 1621; 11/21/2018 deferred to next session 2/2 time.; 01/08/2019: 1480 ft; 02/25/2019: 1312ft; 04/10/2019: 1400 ft, 05/13/19: 1321 ft    Time  4    Period  Weeks    Status  On-going      PT LONG TERM GOAL #5   Title  Patient will be able to run 251ft without stopping to better be able to return to recreational activites and address cardiovascular endurance.    Baseline  70ft; 02/25/2019: 76ft; 04/10/2019 deferred    Time  4    Period  Weeks    Status  On-going      PT LONG TERM GOAL #6   Title  Patient will improve L hip flexion strength to 4/5 for improved steppage gait and reduced fall risk.    Baseline  04/10/2019: L hip flexion 3+/5    Time  6    Period  Weeks    Status  New       Plan - 05/13/19 1805    Clinical Impression Statement  Patient able to perform a 6 MWT at a 2.5 mph speed; however, patient showed decreased tolerance for walking as noted by decreased terminal swing through in gait. Pt had 1 loss of balance during last minute of the test.  She caught her foot on carpet and was not able to catch herself by stepping forward.  She required PT assistance with gait belt to regain balance and not fall.  Patient demonstrated a steady HR at a 70%-75% through interval wor indicating an ability to stay at a moderate to high intensity level exercise. Patient showed decreased endurance for standing marches as indicated by decrease hip flexion range w/ later reps in each set, particularly on LLE. Patient would continue to benefit from physical therapy to return her to walking w/o increased likelihood of falling.    Personal Factors and Comorbidities  Past/Current Experience    Examination-Activity Limitations  Locomotion Level    Examination-Participation Restrictions  Community Activity    Stability/Clinical Decision Making  Stable/Uncomplicated    Rehab Potential  Good    Clinical Impairments Affecting Rehab Potential  (-) hx of MS; (+) good  motivation    PT Frequency  2x / week    PT Duration  4 weeks    PT Treatment/Interventions  Moist Heat;Iontophoresis 4mg /ml Dexamethasone;Electrical Stimulation;Aquatic Therapy;Cryotherapy;Therapeutic activities;Neuromuscular re-education;Therapeutic exercise;Patient/family education;Dry  needling;Manual techniques;Passive range of motion;Balance training;Functional mobility training;Gait training;Stair training;Joint Manipulations    PT Next Visit Plan  Continue addressing static (eyes closed), dynamic balance exercises, plyometric power movements, as well as incorporating more dual tasking and coordination activities; update HEP    PT Home Exercise Plan  hip flexion in sitting for strengthening, hip flexion knee flexion in standing for balance, bike intervals at 70%-75% max HR    Consulted and Agree with Plan of Care  Patient       Patient will benefit from skilled therapeutic intervention in order to improve the following deficits and impairments:  Pain, Decreased coordination, Impaired perceived functional ability, Increased fascial restricitons, Increased muscle spasms, Difficulty walking, Abnormal gait, Decreased balance, Decreased mobility, Impaired sensation  Visit Diagnosis: Difficulty in walking, not elsewhere classified  Muscle weakness (generalized)  History of falling     Problem List There are no problems to display for this patient.   Gloriann Loan, SPT  05/13/2019, 6:32 PM   Merdis Delay, PT, DPT     [place] [facility] [address], [place], [address] Phone: [phone]   Fax:  [phone]  Name: Michelle Ruiz MRN: [id number] Date of Birth: 1970-07-20

## 2019-05-19 ENCOUNTER — Encounter: Payer: Self-pay | Admitting: Obstetrics and Gynecology

## 2019-05-20 ENCOUNTER — Other Ambulatory Visit: Payer: Self-pay

## 2019-05-20 ENCOUNTER — Ambulatory Visit: Payer: BC Managed Care – PPO

## 2019-05-20 DIAGNOSIS — R262 Difficulty in walking, not elsewhere classified: Secondary | ICD-10-CM

## 2019-05-20 DIAGNOSIS — M6281 Muscle weakness (generalized): Secondary | ICD-10-CM

## 2019-05-20 DIAGNOSIS — Z9181 History of falling: Secondary | ICD-10-CM

## 2019-05-21 NOTE — Therapy (Signed)
[facility] [address], [place], [address] Phone: [phone]   Fax:  [phone]  Physical Therapy Treatment  Patient Details  Name: Michelle Ruiz MRN: [id number] Date of Birth: 02-24-1971 No data recorded  Encounter Date: 05/20/2019  PT End of Session - 05/21/19 1550    Visit Number  81    Number of Visits  85    Date for PT Re-Evaluation  05/22/19    Authorization Type  10/10    PT Start Time  1700    PT Stop Time  1745    PT Time Calculation (min)  45 min    Equipment Utilized During Treatment  Gait belt    Activity Tolerance  Patient tolerated treatment well;No increased pain;Patient limited by fatigue    Behavior During Therapy  [facility] for tasks assessed/performed       Past Medical History:  Diagnosis Date  . Abnormal Pap smear of cervix   . Anxiety   . Kidney stones   . MS (multiple sclerosis) ([place])   . UTI (urinary tract infection)     Past Surgical History:  Procedure Laterality Date  . BREAST BIOPSY Right 2010   stereotactic biopsy/ neg/ dr.byrnett  . BREAST BIOPSY Left 2012   neg/dr. brynett  . CESAREAN SECTION  2008   RPH  . TONSILLECTOMY  2006    There were no vitals filed for this visit.  Subjective Assessment - 05/21/19 1546    Subjective  Patient reports increased ankle pain that increased pain overall. Patient states difficulty with exercise but reports increased enjoyment with activity.    Patient is accompained by:  Family member    Pertinent History  Patient reports history of falls x3 in the past 6 months secondary to drop foot on the L LE. PMH: Multiple sclerosis; elbow fracture of the R UE    Limitations  Standing;Walking    How long can you stand comfortably?  20 min    How long can you walk comfortably?  1 miles    Patient Stated Goals  To be more balanced and core strengthening     Currently in Pain?  No/denies    Pain Onset  More than a month ago        TREATMENT  TE Interval Exercises 45sec on x 2 sets of each exercise TRX squats  Standing Marches w/ 5# wt Plantar flexion off of step  Toe flexion/ great toe resisted flexion Monster walks w/ RTB  Hip abduction in standing   Assessed ankle pain Increased pain with TTP along flexor hallucis longus, flexor digitorum longus and tibialis posterior Therapeutic exercises performed to increase cardiorespiratory endurance and LE muscular endurance.    PT Education - 05/21/19 1550    Education Details  form/technique with exercise    Person(s) Educated  Patient    Methods  Explanation;Demonstration    Comprehension  Returned demonstration;Verbalized understanding          PT Long Term Goals - 05/13/19 1824      PT LONG TERM GOAL #1   Title  Patient will be independent with balance HEP to continue benefits of therapy after discharge.    Baseline  dependent with form/technique for balance exercise; 03/25/2018: Independent     Time  6    Period  Weeks    Status  Achieved      PT LONG TERM GOAL #2   Title  Patient will improve FGA  by 4 points to indicate significant improvement with balancing while ambulating and decrease of fall risk    Baseline  03/25/2018: 28/30; 04/10/2019: 22    Time  6    Period  Weeks    Status  On-going      PT LONG TERM GOAL #3   Title  Patient will be able to perform prolonged single leg stance >10sec with eyes closed to indicate improvement with static balance and decrease fall risk    Baseline  2 sec in standing EC; 03/25/2018: 3 sec; 04/29/2018: 4 sec; 08/21/2018: 5 sec; 09/19/2018: 6 sec ; 10/21/2018 3 sec; 11/21/2018 5 sec; 01/08/2019: 9 sec; 02/25/2019: 14sec    Time  4    Period  Weeks    Status  Achieved      PT LONG TERM GOAL #4   Title  Pt will improve 6 MWT >2042ft to demonstrate improved cardiorespiratory fitness    Baseline  Deferred to next session: 04/29/2018; 05/01/2018: 1475ft; 08/21/2018: 1442ft; 09/19/2018: 1487ft; 10/21/2018 1621;  11/21/2018 deferred to next session 2/2 time.; 01/08/2019: 1480 ft; 02/25/2019: 1310ft; 04/10/2019: 1400 ft, 05/13/19: 1321 ft    Time  4    Period  Weeks    Status  On-going      PT LONG TERM GOAL #5   Title  Patient will be able to run 250ft without stopping to better be able to return to recreational activites and address cardiovascular endurance.    Baseline  37ft; 02/25/2019: 49ft; 04/10/2019 deferred    Time  4    Period  Weeks    Status  On-going      PT LONG TERM GOAL #6   Title  Patient will improve L hip flexion strength to 4/5 for improved steppage gait and reduced fall risk.    Baseline  04/10/2019: L hip flexion 3+/5    Time  6    Period  Weeks    Status  New            Plan - 05/21/19 1551    Clinical Impression Statement  Patient demonstrates increased pain along her ankle inverters which is aggravated with TTP and against flexor hallucis longus. Patient demonstrates increased onset of fatigue with performing squatting and hip abduction exercises. Patient demonstrates poor strength along the LE and will benefit form furhter skilled therapy to return to prior level of function.    Personal Factors and Comorbidities  Past/Current Experience    Examination-Activity Limitations  Locomotion Level    Examination-Participation Restrictions  Community Activity    Stability/Clinical Decision Making  Stable/Uncomplicated    Rehab Potential  Good    Clinical Impairments Affecting Rehab Potential  (-) hx of MS; (+) good motivation    PT Frequency  2x / week    PT Duration  4 weeks    PT Treatment/Interventions  Moist Heat;Iontophoresis 4mg /ml Dexamethasone;Electrical Stimulation;Aquatic Therapy;Cryotherapy;Therapeutic activities;Neuromuscular re-education;Therapeutic exercise;Patient/family education;Dry needling;Manual techniques;Passive range of motion;Balance training;Functional mobility training;Gait training;Stair training;Joint Manipulations    PT Next Visit Plan  Continue  addressing static (eyes closed), dynamic balance exercises, plyometric power movements, as well as incorporating more dual tasking and coordination activities; update HEP    PT Home Exercise Plan  hip flexion in sitting for strengthening, hip flexion knee flexion in standing for balance, bike intervals at 70%-75% max HR    Consulted and Agree with Plan of Care  Patient       Patient will benefit from skilled therapeutic intervention in order to improve  the following deficits and impairments:  Pain, Decreased coordination, Impaired perceived functional ability, Increased fascial restricitons, Increased muscle spasms, Difficulty walking, Abnormal gait, Decreased balance, Decreased mobility, Impaired sensation  Visit Diagnosis: Difficulty in walking, not elsewhere classified  Muscle weakness (generalized)  History of falling     Problem List There are no problems to display for this patient.   [name], PT DPT 05/21/2019, 3:59 PM  [facility] [address] [place], [address] Phone: [phone]   Fax:  [phone]  Name: Michelle Ruiz MRN: [id number] Date of Birth: 02-Jul-1970

## 2019-05-27 ENCOUNTER — Ambulatory Visit: Payer: BC Managed Care – PPO | Attending: Neurology

## 2019-05-27 ENCOUNTER — Other Ambulatory Visit: Payer: Self-pay

## 2019-05-27 DIAGNOSIS — Z9181 History of falling: Secondary | ICD-10-CM | POA: Insufficient documentation

## 2019-05-27 DIAGNOSIS — M6281 Muscle weakness (generalized): Secondary | ICD-10-CM | POA: Insufficient documentation

## 2019-05-27 DIAGNOSIS — R262 Difficulty in walking, not elsewhere classified: Secondary | ICD-10-CM

## 2019-05-28 NOTE — Therapy (Signed)
[facility] [address] [place], [address] Phone: [phone]   Fax:  [phone]  Physical Therapy Treatment/ Progress Note  Patient Details  Name: Michelle Ruiz MRN: [id number] Date of Birth: [birth year] No data recorded  Reporting Period: 04/10/2019 - 05/27/2019  Encounter Date: 05/27/2019  PT End of Session - 05/27/19 1712    Visit Number  26    Number of Visits  92    Date for PT Re-Evaluation  07/08/19    Authorization Type  1 / 10    PT Start Time  1700    PT Stop Time  1745    PT Time Calculation (min)  45 min    Equipment Utilized During Treatment  Gait belt    Activity Tolerance  Patient tolerated treatment well;No increased pain;Patient limited by fatigue    Behavior During Therapy  [facility] for tasks assessed/performed       Past Medical History:  Diagnosis Date  . Abnormal Pap smear of cervix   . Anxiety   . Kidney stones   . MS (multiple sclerosis) ([place])   . UTI (urinary tract infection)     Past Surgical History:  Procedure Laterality Date  . BREAST BIOPSY Right 2010   stereotactic biopsy/ neg/ dr.byrnett  . BREAST BIOPSY Left 2012   neg/dr. brynett  . CESAREAN SECTION  2008   RPH  . TONSILLECTOMY  2006    There were no vitals filed for this visit.  Subjective Assessment - 05/27/19 1702    Subjective  Patient reports no major changes since the previous session. Patient reports increased difficulty with walking.    Patient is accompained by:  Family member    Pertinent History  Patient reports history of falls x3 in the past 6 months secondary to drop foot on the L LE. PMH: Multiple sclerosis; elbow fracture of the R UE    Limitations  Standing;Walking    How long can you stand comfortably?  20 min    How long can you walk comfortably?  1 miles    Patient Stated Goals  To be more balanced and core strengthening     Currently in Pain?  No/denies    Pain Onset  More than a month ago         TREATMENT Therapeutic Exercise Heel lifts in standing - 1 min to improve calf pain Lunges background - 30sec  Hip extension with YTB - 4 x 30sec Squats in standing - 1 min Hip abduction in standing - 1 min Cone taps with 3 cones--x 5 Standing on airex pad with ball tosses intermittently standing on one foot - x 20 B - Patient requires intermittent UE support  Performed exercises to improve pain and increase LE strengthening and endurance   PT Education - 05/27/19 1709    Education Details  form/technique with exercise    Person(s) Educated  Patient    Methods  Explanation;Demonstration    Comprehension  Verbalized understanding;Returned demonstration          PT Long Term Goals - 05/27/19 1711      PT LONG TERM GOAL #1   Title  Patient will be independent with balance HEP to continue benefits of therapy after discharge.    Baseline  dependent with form/technique for balance exercise; 03/25/2018: Independent     Time  6    Period  Weeks    Status  Achieved  PT LONG TERM GOAL #2   Title  Patient will improve FGA by 4 points to indicate significant improvement with balancing while ambulating and decrease of fall risk    Baseline  03/25/2018: 28/30; 04/10/2019: 22    Time  6    Period  Weeks    Status  On-going      PT LONG TERM GOAL #3   Title  Patient will be able to perform prolonged single leg stance >10sec with eyes closed to indicate improvement with static balance and decrease fall risk    Baseline  2 sec in standing EC; 03/25/2018: 3 sec; 04/29/2018: 4 sec; 08/21/2018: 5 sec; 09/19/2018: 6 sec ; 10/21/2018 3 sec; 11/21/2018 5 sec; 01/08/2019: 9 sec; 02/25/2019: 14sec    Time  4    Period  Weeks    Status  Achieved      PT LONG TERM GOAL #4   Title  Pt will improve 6 MWT >2069ft to demonstrate improved cardiorespiratory fitness    Baseline  Deferred to next session: 04/29/2018; 05/01/2018: 1465ft; 08/21/2018: 1452ft; 09/19/2018: 1432ft; 10/21/2018 1621;  11/21/2018 deferred to next session 2/2 time.; 01/08/2019: 1480 ft; 02/25/2019: 1320ft; 04/10/2019: 1400 ft, 05/13/19: 1321 ft    Time  4    Period  Weeks    Status  On-going      PT LONG TERM GOAL #5   Title  Patient will be able to run 267ft without stopping to better be able to return to recreational activites and address cardiovascular endurance.    Baseline  45ft; 02/25/2019: 50ft; 04/10/2019 deferred    Time  4    Period  Weeks    Status  On-going      PT LONG TERM GOAL #6   Title  Patient will improve L hip flexion strength to 4/5 for improved steppage gait and reduced fall risk.    Baseline  04/10/2019: L hip flexion 3+/5    Time  6    Period  Weeks    Status  New            Plan - 05/27/19 1722    Clinical Impression Statement  Patient making progress toward long term goals with improvement in walking and performing interval based exercises. Patient demonstrates difficulty with SLS on compliant surfaces without UE support. Continuing to work on interval training to improve muscular endurance and strength to return to prior level of function and decrease overall fall risk.    Personal Factors and Comorbidities  Past/Current Experience    Examination-Activity Limitations  Locomotion Level    Examination-Participation Restrictions  Community Activity    Stability/Clinical Decision Making  Stable/Uncomplicated    Rehab Potential  Good    Clinical Impairments Affecting Rehab Potential  (-) hx of MS; (+) good motivation    PT Frequency  2x / week    PT Duration  4 weeks    PT Treatment/Interventions  Moist Heat;Iontophoresis 4mg /ml Dexamethasone;Electrical Stimulation;Aquatic Therapy;Cryotherapy;Therapeutic activities;Neuromuscular re-education;Therapeutic exercise;Patient/family education;Dry needling;Manual techniques;Passive range of motion;Balance training;Functional mobility training;Gait training;Stair training;Joint Manipulations    PT Next Visit Plan  Continue addressing  static (eyes closed), dynamic balance exercises, plyometric power movements, as well as incorporating more dual tasking and coordination activities; update HEP    PT Home Exercise Plan  hip flexion in sitting for strengthening, hip flexion knee flexion in standing for balance, bike intervals at 70%-75% max HR    Consulted and Agree with Plan of Care  Patient  Patient will benefit from skilled therapeutic intervention in order to improve the following deficits and impairments:  Pain, Decreased coordination, Impaired perceived functional ability, Increased fascial restricitons, Increased muscle spasms, Difficulty walking, Abnormal gait, Decreased balance, Decreased mobility, Impaired sensation  Visit Diagnosis: Difficulty in walking, not elsewhere classified  Muscle weakness (generalized)     Problem List There are no problems to display for this patient.   [name], PT DPT 05/28/2019, 10:18 AM  [place] [facility] [address] [place], [address] Phone: [phone]   Fax:  [phone]  Name: Michelle Ruiz MRN: [id number] Date of Birth: 1970/05/30

## 2019-06-03 ENCOUNTER — Ambulatory Visit: Payer: BC Managed Care – PPO

## 2019-06-03 ENCOUNTER — Other Ambulatory Visit: Payer: Self-pay

## 2019-06-03 DIAGNOSIS — M6281 Muscle weakness (generalized): Secondary | ICD-10-CM

## 2019-06-03 DIAGNOSIS — R262 Difficulty in walking, not elsewhere classified: Secondary | ICD-10-CM

## 2019-06-04 NOTE — Therapy (Signed)
[facility] [address], [place], [address] Phone: [phone]   Fax:  [phone]  Physical Therapy Treatment  Patient Details  Name: Michelle Ruiz MRN: [id number] Date of Birth: 06/03/1970 No data recorded  Encounter Date: 06/03/2019  PT End of Session - 06/03/19 1725    Visit Number  58    Number of Visits  92    Date for PT Re-Evaluation  07/08/19    Authorization Type  2 / 10    PT Start Time  1700    PT Stop Time  1745    PT Time Calculation (min)  45 min    Equipment Utilized During Treatment  Gait belt    Activity Tolerance  Patient tolerated treatment well;No increased pain;Patient limited by fatigue    Behavior During Therapy  [facility] for tasks assessed/performed       Past Medical History:  Diagnosis Date  . Abnormal Pap smear of cervix   . Anxiety   . Kidney stones   . MS (multiple sclerosis) ([place])   . UTI (urinary tract infection)     Past Surgical History:  Procedure Laterality Date  . BREAST BIOPSY Right 2010   stereotactic biopsy/ neg/ dr.byrnett  . BREAST BIOPSY Left 2012   neg/dr. brynett  . CESAREAN SECTION  2008   RPH  . TONSILLECTOMY  2006    There were no vitals filed for this visit.  Subjective Assessment - 06/03/19 1709    Subjective  Patient states she's been walking around the block and does not report any falls since the previous session.    Patient is accompained by:  Family member    Pertinent History  Patient reports history of falls x3 in the past 6 months secondary to drop foot on the L LE. PMH: Multiple sclerosis; elbow fracture of the R UE    Limitations  Standing;Walking    How long can you stand comfortably?  20 min    How long can you walk comfortably?  1 miles    Patient Stated Goals  To be more balanced and core strengthening     Currently in Pain?  No/denies    Pain Onset  More than a month ago          Therapeutic Exercise Side stepping with  black theraband - x10 B Heel lifts in standing - 1 min to improve calf pain Lunges background - 30sec  Squats in standing - x 1 min  Hip circles with GTB - 30sec  Asending/descending the stairs - 8 (4 stairs) Manually resisted ankle inversion, toe flexion, great toe flexion - x 10 with 5 sec holds Calf stretch in long sitting - x 20    Performed exercises to improve pain and increase LE strengthening and endurance  PT Education - 06/03/19 1724    Education Details  form/technique with exercise    Person(s) Educated  Patient    Methods  Explanation;Demonstration    Comprehension  Returned demonstration;Verbalized understanding          PT Long Term Goals - 05/27/19 1711      PT LONG TERM GOAL #1   Title  Patient will be independent with balance HEP to continue benefits of therapy after discharge.    Baseline  dependent with form/technique for balance exercise; 03/25/2018: Independent     Time  6    Period  Weeks    Status  Achieved  PT LONG TERM GOAL #2   Title  Patient will improve FGA by 4 points to indicate significant improvement with balancing while ambulating and decrease of fall risk    Baseline  03/25/2018: 28/30; 04/10/2019: 22    Time  6    Period  Weeks    Status  On-going      PT LONG TERM GOAL #3   Title  Patient will be able to perform prolonged single leg stance >10sec with eyes closed to indicate improvement with static balance and decrease fall risk    Baseline  2 sec in standing EC; 03/25/2018: 3 sec; 04/29/2018: 4 sec; 08/21/2018: 5 sec; 09/19/2018: 6 sec ; 10/21/2018 3 sec; 11/21/2018 5 sec; 01/08/2019: 9 sec; 02/25/2019: 14sec    Time  4    Period  Weeks    Status  Achieved      PT LONG TERM GOAL #4   Title  Pt will improve 6 MWT >2028ft to demonstrate improved cardiorespiratory fitness    Baseline  Deferred to next session: 04/29/2018; 05/01/2018: 1447ft; 08/21/2018: 1470ft; 09/19/2018: 142ft; 10/21/2018 1621; 11/21/2018 deferred to next session 2/2 time.;  01/08/2019: 1480 ft; 02/25/2019: 1374ft; 04/10/2019: 1400 ft, 05/13/19: 1321 ft    Time  4    Period  Weeks    Status  On-going      PT LONG TERM GOAL #5   Title  Patient will be able to run 232ft without stopping to better be able to return to recreational activites and address cardiovascular endurance.    Baseline  29ft; 02/25/2019: 27ft; 04/10/2019 deferred    Time  4    Period  Weeks    Status  On-going      PT LONG TERM GOAL #6   Title  Patient will improve L hip flexion strength to 4/5 for improved steppage gait and reduced fall risk.    Baseline  04/10/2019: L hip flexion 3+/5    Time  6    Period  Weeks    Status  New            Plan - 06/04/19 1029    Clinical Impression Statement  Conitnued to focus on performing interval training to improve strengthening along the LE, decrease fall risk, and improve ability to walk for longer periods of time before requiring a a sitting rest break. Patient able to perform greater amount of exercises compared to previous session however, continues to demosntrate increased fatigue during the session requiring sitting rest breaks through the session. Patient with increased difficulties with squatting through greater AROM and performing descending the stairs, this is most likely due to decreased dorsiflexion. Patient will benefit from further skilled therapy focused on improving limitations to return to prior level of function.    Personal Factors and Comorbidities  Past/Current Experience    Examination-Activity Limitations  Locomotion Level    Examination-Participation Restrictions  Community Activity    Stability/Clinical Decision Making  Stable/Uncomplicated    Rehab Potential  Good    Clinical Impairments Affecting Rehab Potential  (-) hx of MS; (+) good motivation    PT Frequency  2x / week    PT Duration  4 weeks    PT Treatment/Interventions  Moist Heat;Iontophoresis 4mg /ml Dexamethasone;Electrical Stimulation;Aquatic  Therapy;Cryotherapy;Therapeutic activities;Neuromuscular re-education;Therapeutic exercise;Patient/family education;Dry needling;Manual techniques;Passive range of motion;Balance training;Functional mobility training;Gait training;Stair training;Joint Manipulations    PT Next Visit Plan  Continue addressing static (eyes closed), dynamic balance exercises, plyometric power movements, as well as incorporating more dual tasking  and coordination activities; update HEP    PT Home Exercise Plan  hip flexion in sitting for strengthening, hip flexion knee flexion in standing for balance, bike intervals at 70%-75% max HR    Consulted and Agree with Plan of Care  Patient       Patient will benefit from skilled therapeutic intervention in order to improve the following deficits and impairments:  Pain, Decreased coordination, Impaired perceived functional ability, Increased fascial restricitons, Increased muscle spasms, Difficulty walking, Abnormal gait, Decreased balance, Decreased mobility, Impaired sensation  Visit Diagnosis: Difficulty in walking, not elsewhere classified  Muscle weakness (generalized)     Problem List There are no problems to display for this patient.   [name], PT DPT 06/04/2019, 10:34 AM  [facility] [address] [place], [address] Phone: [phone]   Fax:  [phone]  Name: Michelle Ruiz MRN: [id number] Date of Birth: 1970/07/12

## 2019-06-09 ENCOUNTER — Ambulatory Visit (INDEPENDENT_AMBULATORY_CARE_PROVIDER_SITE_OTHER): Payer: Self-pay

## 2019-06-09 ENCOUNTER — Other Ambulatory Visit: Payer: Self-pay

## 2019-06-09 DIAGNOSIS — L578 Other skin changes due to chronic exposure to nonionizing radiation: Secondary | ICD-10-CM

## 2019-06-09 DIAGNOSIS — L821 Other seborrheic keratosis: Secondary | ICD-10-CM

## 2019-06-09 DIAGNOSIS — L719 Rosacea, unspecified: Secondary | ICD-10-CM

## 2019-06-09 DIAGNOSIS — L988 Other specified disorders of the skin and subcutaneous tissue: Secondary | ICD-10-CM

## 2019-06-09 DIAGNOSIS — I781 Nevus, non-neoplastic: Secondary | ICD-10-CM

## 2019-06-11 ENCOUNTER — Ambulatory Visit: Payer: BC Managed Care – PPO

## 2019-06-11 ENCOUNTER — Other Ambulatory Visit: Payer: Self-pay

## 2019-06-11 DIAGNOSIS — R262 Difficulty in walking, not elsewhere classified: Secondary | ICD-10-CM | POA: Diagnosis not present

## 2019-06-11 DIAGNOSIS — Z9181 History of falling: Secondary | ICD-10-CM

## 2019-06-11 DIAGNOSIS — M6281 Muscle weakness (generalized): Secondary | ICD-10-CM

## 2019-06-11 NOTE — Therapy (Signed)
[facility] [address] [place], [address] Phone: [phone]   Fax:  [phone]  Physical Therapy Treatment  Patient Details  Name: Michelle Ruiz MRN: [id number] Date of Birth: 16-Feb-1971 No data recorded  Encounter Date: 06/11/2019  PT End of Session - 06/11/19 1637    Visit Number  54    Number of Visits  92    Date for PT Re-Evaluation  07/08/19    Authorization Type  3 / 10    PT Start Time  1630    PT Stop Time  1715    PT Time Calculation (min)  45 min    Equipment Utilized During Treatment  Gait belt    Activity Tolerance  Patient tolerated treatment well;No increased pain;Patient limited by fatigue    Behavior During Therapy  [facility] for tasks assessed/performed       Past Medical History:  Diagnosis Date  . Abnormal Pap smear of cervix   . Anxiety   . Kidney stones   . MS (multiple sclerosis) ([place])   . UTI (urinary tract infection)     Past Surgical History:  Procedure Laterality Date  . BREAST BIOPSY Right 2010   stereotactic biopsy/ neg/ dr.byrnett  . BREAST BIOPSY Left 2012   neg/dr. brynett  . CESAREAN SECTION  2008   RPH  . TONSILLECTOMY  2006    There were no vitals filed for this visit.  Subjective Assessment - 06/11/19 1633    Subjective  Patient reports no major changes since the previous session. Patient states she has been walking " fine ".    Patient is accompained by:  Family member    Pertinent History  Patient reports history of falls x3 in the past 6 months secondary to drop foot on the L LE. PMH: Multiple sclerosis; elbow fracture of the R UE    Limitations  Standing;Walking    How long can you stand comfortably?  20 min    How long can you walk comfortably?  1 miles    Patient Stated Goals  To be more balanced and core strengthening     Currently in Pain?  No/denies    Pain Onset  More than a month ago       Therapeutic Exercise Lunges background - 60 sec   Squats in standing - 60 sec   Single leg stance - 60sec  Hip abduction with UE support - 30sec  Soccer kicks in standing - 2 x 60sec  Monster walks with GTB - 2 x 60sec  Knees to wall on single leg - 2 x 10  Calf stretch in standing with B UE support - x 10 20sec     Performed exercises to improve pain and increase LE strengthening and endurance  PT Education - 06/11/19 1636    Education Details  form/technique with exercise    Person(s) Educated  Patient    Methods  Explanation;Demonstration    Comprehension  Verbalized understanding;Returned demonstration          PT Long Term Goals - 05/27/19 1711      PT LONG TERM GOAL #1   Title  Patient will be independent with balance HEP to continue benefits of therapy after discharge.    Baseline  dependent with form/technique for balance exercise; 03/25/2018: Independent     Time  6    Period  Weeks    Status  Achieved      PT  LONG TERM GOAL #2   Title  Patient will improve FGA by 4 points to indicate significant improvement with balancing while ambulating and decrease of fall risk    Baseline  03/25/2018: 28/30; 04/10/2019: 22    Time  6    Period  Weeks    Status  On-going      PT LONG TERM GOAL #3   Title  Patient will be able to perform prolonged single leg stance >10sec with eyes closed to indicate improvement with static balance and decrease fall risk    Baseline  2 sec in standing EC; 03/25/2018: 3 sec; 04/29/2018: 4 sec; 08/21/2018: 5 sec; 09/19/2018: 6 sec ; 10/21/2018 3 sec; 11/21/2018 5 sec; 01/08/2019: 9 sec; 02/25/2019: 14sec    Time  4    Period  Weeks    Status  Achieved      PT LONG TERM GOAL #4   Title  Pt will improve 6 MWT >2054ft to demonstrate improved cardiorespiratory fitness    Baseline  Deferred to next session: 04/29/2018; 05/01/2018: 1445ft; 08/21/2018: 1450ft; 09/19/2018: 1492ft; 10/21/2018 1621; 11/21/2018 deferred to next session 2/2 time.; 01/08/2019: 1480 ft; 02/25/2019: 1333ft; 04/10/2019: 1400 ft,  05/13/19: 1321 ft    Time  4    Period  Weeks    Status  On-going      PT LONG TERM GOAL #5   Title  Patient will be able to run 258ft without stopping to better be able to return to recreational activites and address cardiovascular endurance.    Baseline  37ft; 02/25/2019: 51ft; 04/10/2019 deferred    Time  4    Period  Weeks    Status  On-going      PT LONG TERM GOAL #6   Title  Patient will improve L hip flexion strength to 4/5 for improved steppage gait and reduced fall risk.    Baseline  04/10/2019: L hip flexion 3+/5    Time  6    Period  Weeks    Status  New            Plan - 06/11/19 1638    Clinical Impression Statement  Patient demonstrates increased fatigue during today's session continued to address single leg stance during today's session to address walking ability. Patient is improving requiring less sitting trest breaking during the session compared to previous visits. COntinued to focus on strength along the calf to decrease pain in the ankle and also build stabilization along the posterior chain. Patient will benefit from further skilled therapy focused on improving limitations to return to prior level of function.    Personal Factors and Comorbidities  Past/Current Experience    Examination-Activity Limitations  Locomotion Level    Examination-Participation Restrictions  Community Activity    Stability/Clinical Decision Making  Stable/Uncomplicated    Rehab Potential  Good    Clinical Impairments Affecting Rehab Potential  (-) hx of MS; (+) good motivation    PT Frequency  2x / week    PT Duration  4 weeks    PT Treatment/Interventions  Moist Heat;Iontophoresis 4mg /ml Dexamethasone;Electrical Stimulation;Aquatic Therapy;Cryotherapy;Therapeutic activities;Neuromuscular re-education;Therapeutic exercise;Patient/family education;Dry needling;Manual techniques;Passive range of motion;Balance training;Functional mobility training;Gait training;Stair training;Joint  Manipulations    PT Next Visit Plan  Continue addressing static (eyes closed), dynamic balance exercises, plyometric power movements, as well as incorporating more dual tasking and coordination activities; update HEP    PT Home Exercise Plan  hip flexion in sitting for strengthening, hip flexion knee flexion in standing for balance,  bike intervals at 70%-75% max HR    Consulted and Agree with Plan of Care  Patient       Patient will benefit from skilled therapeutic intervention in order to improve the following deficits and impairments:  Pain, Decreased coordination, Impaired perceived functional ability, Increased fascial restricitons, Increased muscle spasms, Difficulty walking, Abnormal gait, Decreased balance, Decreased mobility, Impaired sensation  Visit Diagnosis: Difficulty in walking, not elsewhere classified  Muscle weakness (generalized)  History of falling     Problem List There are no problems to display for this patient.   [name], PT DPT 06/11/2019, 5:21 PM  [facility] [address] [place], [address] Phone: [phone]   Fax:  [phone]  Name: BEREA NOGUERAS MRN: [id number] Date of Birth: Jun 16, 1970

## 2019-06-23 ENCOUNTER — Other Ambulatory Visit: Payer: Self-pay

## 2019-06-23 ENCOUNTER — Ambulatory Visit: Payer: BC Managed Care – PPO

## 2019-06-23 DIAGNOSIS — R262 Difficulty in walking, not elsewhere classified: Secondary | ICD-10-CM

## 2019-06-23 DIAGNOSIS — M6281 Muscle weakness (generalized): Secondary | ICD-10-CM

## 2019-06-23 NOTE — Therapy (Signed)
[facility] [address], [place], [address] Phone: [phone]   Fax:  [phone]  Physical Therapy Treatment  Patient Details  Name: Michelle Ruiz MRN: [id number] Date of Birth: [birth year] No data recorded  Encounter Date: 06/23/2019  PT End of Session - 06/23/19 1725    Visit Number  84    Number of Visits  92    Date for PT Re-Evaluation  07/08/19    Authorization Type  4 / 10    PT Start Time  [id number]    PT Stop Time  1800    PT Time Calculation (min)  45 min    Equipment Utilized During Treatment  Gait belt    Activity Tolerance  Patient tolerated treatment well;No increased pain;Patient limited by fatigue    Behavior During Therapy  [facility] for tasks assessed/performed       Past Medical History:  Diagnosis Date  . Abnormal Pap smear of cervix   . Anxiety   . Kidney stones   . MS (multiple sclerosis) ([place])   . UTI (urinary tract infection)     Past Surgical History:  Procedure Laterality Date  . BREAST BIOPSY Right 2010   stereotactic biopsy/ neg/ dr.byrnett  . BREAST BIOPSY Left 2012   neg/dr. brynett  . CESAREAN SECTION  2008   RPH  . TONSILLECTOMY  2006    There were no vitals filed for this visit.  Subjective Assessment - 06/23/19 1718    Subjective  Patient states she had her COVID vaccine and has been feeling increased fatigue the past two days.    Patient is accompained by:  Family member    Pertinent History  Patient reports history of falls x3 in the past 6 months secondary to drop foot on the L LE. PMH: Multiple sclerosis; elbow fracture of the R UE    Limitations  Standing;Walking    How long can you stand comfortably?  20 min    How long can you walk comfortably?  1 miles    Patient Stated Goals  To be more balanced and core strengthening     Currently in Pain?  No/denies    Pain Onset  More than a month ago       TREATMENT Therapeutic Exercise Squats in standing with  TRX - x 10 Hip abduction/adduction; extension/flexion - 3 x 71ft  Monster walks with YTB - x 10 Side lunges - x 10 Tandem amb - x10 forward/backward Step up onto 10" - x 10 Running man with UE support - x 10 Standing semitandem ball toss - x 15 on airex pad  Performed exercises to improve LE strength and decrease fall risk  PT Education - 06/23/19 1723    Education Details  form/technique with exercise    Person(s) Educated  Patient    Methods  Explanation;Demonstration    Comprehension  Verbalized understanding;Returned demonstration          PT Long Term Goals - 05/27/19 1711      PT LONG TERM GOAL #1   Title  Patient will be independent with balance HEP to continue benefits of therapy after discharge.    Baseline  dependent with form/technique for balance exercise; 03/25/2018: Independent     Time  6    Period  Weeks    Status  Achieved      PT LONG TERM GOAL #2   Title  Patient will improve FGA by 4 points  to indicate significant improvement with balancing while ambulating and decrease of fall risk    Baseline  03/25/2018: 28/30; 04/10/2019: 22    Time  6    Period  Weeks    Status  On-going      PT LONG TERM GOAL #3   Title  Patient will be able to perform prolonged single leg stance >10sec with eyes closed to indicate improvement with static balance and decrease fall risk    Baseline  2 sec in standing EC; 03/25/2018: 3 sec; 04/29/2018: 4 sec; 08/21/2018: 5 sec; 09/19/2018: 6 sec ; 10/21/2018 3 sec; 11/21/2018 5 sec; 01/08/2019: 9 sec; 02/25/2019: 14sec    Time  4    Period  Weeks    Status  Achieved      PT LONG TERM GOAL #4   Title  Pt will improve 6 MWT >2034ft to demonstrate improved cardiorespiratory fitness    Baseline  Deferred to next session: 04/29/2018; 05/01/2018: 1429ft; 08/21/2018: 143ft; 09/19/2018: 1438ft; 10/21/2018 1621; 11/21/2018 deferred to next session 2/2 time.; 01/08/2019: 1480 ft; 02/25/2019: 1379ft; 04/10/2019: 1400 ft, 05/13/19: 1321 ft    Time  4     Period  Weeks    Status  On-going      PT LONG TERM GOAL #5   Title  Patient will be able to run 217ft without stopping to better be able to return to recreational activites and address cardiovascular endurance.    Baseline  71ft; 02/25/2019: 10ft; 04/10/2019 deferred    Time  4    Period  Weeks    Status  On-going      PT LONG TERM GOAL #6   Title  Patient will improve L hip flexion strength to 4/5 for improved steppage gait and reduced fall risk.    Baseline  04/10/2019: L hip flexion 3+/5    Time  6    Period  Weeks    Status  New            Plan - 06/23/19 1733    Clinical Impression Statement  Performed less exercises today as patient felt increase fatigue after recieivng the COVID vaccine. Patient demonstrates difficulty with narrow BOS balancing and increased fatigue with exercises; Patient requires use of UE to perform step ups and patient will benefit from further skilled therapy to return to prior level of function.    Personal Factors and Comorbidities  Past/Current Experience    Examination-Activity Limitations  Locomotion Level    Examination-Participation Restrictions  Community Activity    Stability/Clinical Decision Making  Stable/Uncomplicated    Rehab Potential  Good    Clinical Impairments Affecting Rehab Potential  (-) hx of MS; (+) good motivation    PT Frequency  2x / week    PT Duration  4 weeks    PT Treatment/Interventions  Moist Heat;Iontophoresis 4mg /ml Dexamethasone;Electrical Stimulation;Aquatic Therapy;Cryotherapy;Therapeutic activities;Neuromuscular re-education;Therapeutic exercise;Patient/family education;Dry needling;Manual techniques;Passive range of motion;Balance training;Functional mobility training;Gait training;Stair training;Joint Manipulations    PT Next Visit Plan  Continue addressing static (eyes closed), dynamic balance exercises, plyometric power movements, as well as incorporating more dual tasking and coordination activities; update HEP     PT Home Exercise Plan  hip flexion in sitting for strengthening, hip flexion knee flexion in standing for balance, bike intervals at 70%-75% max HR    Consulted and Agree with Plan of Care  Patient       Patient will benefit from skilled therapeutic intervention in order to improve the following deficits and impairments:  Pain, Decreased coordination, Impaired perceived functional ability, Increased fascial restricitons, Increased muscle spasms, Difficulty walking, Abnormal gait, Decreased balance, Decreased mobility, Impaired sensation  Visit Diagnosis: Difficulty in walking, not elsewhere classified  Muscle weakness (generalized)     Problem List There are no problems to display for this patient.   [name], PT DPT 06/23/2019, 5:53 PM  [facility] [address], [place], [address] Phone: [phone]   Fax:  [phone]  Name: Michelle Ruiz MRN: [id number] Date of Birth: 1970/12/23

## 2019-06-24 ENCOUNTER — Ambulatory Visit: Payer: BC Managed Care – PPO

## 2019-07-01 ENCOUNTER — Other Ambulatory Visit: Payer: Self-pay

## 2019-07-01 ENCOUNTER — Ambulatory Visit: Payer: BC Managed Care – PPO | Attending: Neurology

## 2019-07-01 DIAGNOSIS — M6281 Muscle weakness (generalized): Secondary | ICD-10-CM

## 2019-07-01 DIAGNOSIS — Z9181 History of falling: Secondary | ICD-10-CM

## 2019-07-01 DIAGNOSIS — R262 Difficulty in walking, not elsewhere classified: Secondary | ICD-10-CM

## 2019-07-02 NOTE — Therapy (Signed)
[facility] [address], [place], [address] Phone: [phone]   Fax:  [phone]  Physical Therapy Treatment  Patient Details  Name: JORA LOGAN MRN: [id number] Date of Birth: 11-09-1970 No data recorded  Encounter Date: 07/01/2019  PT End of Session - 07/01/19 1718    Visit Number  49    Number of Visits  92    Date for PT Re-Evaluation  07/08/19    Authorization Type  5 / 10    PT Start Time  1700    PT Stop Time  1745    PT Time Calculation (min)  45 min    Equipment Utilized During Treatment  Gait belt    Activity Tolerance  Patient tolerated treatment well;No increased pain;Patient limited by fatigue    Behavior During Therapy  [facility] for tasks assessed/performed       Past Medical History:  Diagnosis Date  . Abnormal Pap smear of cervix   . Anxiety   . Kidney stones   . MS (multiple sclerosis) ([place])   . UTI (urinary tract infection)     Past Surgical History:  Procedure Laterality Date  . BREAST BIOPSY Right 2010   stereotactic biopsy/ neg/ dr.byrnett  . BREAST BIOPSY Left 2012   neg/dr. brynett  . CESAREAN SECTION  2008   RPH  . TONSILLECTOMY  2006    There were no vitals filed for this visit.  Subjective Assessment - 07/01/19 1705    Subjective  Patient reports no significant changes since the previous session. Patient states her legs have been feeling 'fine' and has continued to walk and ride the bike.    Patient is accompained by:  Family member    Pertinent History  Patient reports history of falls x3 in the past 6 months secondary to drop foot on the L LE. PMH: Multiple sclerosis; elbow fracture of the R UE    Limitations  Standing;Walking    How long can you stand comfortably?  20 min    How long can you walk comfortably?  1 miles    Patient Stated Goals  To be more balanced and core strengthening     Currently in Pain?  No/denies    Pain Onset  More than a month ago      TREATMENT Therapeutic Exercise Squats in standing with TRX - 2 x 1 min Hip abduction/adduction; extension/flexion - x1 min Hip abduction with TRX support - x 1 min Side hops in standing (small lateral bounding) - x 1 min Forward backward bounding in standing - x 1 min B Step ups with 10" step - x 1 min B Side steps with 10" step - x 1 min B  Standing semitandem ball toss - x 15 on airex pad   Performed exercises to improve LE strength and decrease fall risk    PT Education - 07/01/19 1717    Education Details  form/technique with exercise    Person(s) Educated  Patient    Methods  Explanation;Demonstration    Comprehension  Verbalized understanding;Returned demonstration          PT Long Term Goals - 05/27/19 1711      PT LONG TERM GOAL #1   Title  Patient will be independent with balance HEP to continue benefits of therapy after discharge.    Baseline  dependent with form/technique for balance exercise; 03/25/2018: Independent     Time  6    Period  Weeks    Status  Achieved      PT LONG TERM GOAL #2   Title  Patient will improve FGA by 4 points to indicate significant improvement with balancing while ambulating and decrease of fall risk    Baseline  03/25/2018: 28/30; 04/10/2019: 22    Time  6    Period  Weeks    Status  On-going      PT LONG TERM GOAL #3   Title  Patient will be able to perform prolonged single leg stance >10sec with eyes closed to indicate improvement with static balance and decrease fall risk    Baseline  2 sec in standing EC; 03/25/2018: 3 sec; 04/29/2018: 4 sec; 08/21/2018: 5 sec; 09/19/2018: 6 sec ; 10/21/2018 3 sec; 11/21/2018 5 sec; 01/08/2019: 9 sec; 02/25/2019: 14sec    Time  4    Period  Weeks    Status  Achieved      PT LONG TERM GOAL #4   Title  Pt will improve 6 MWT >206ft to demonstrate improved cardiorespiratory fitness    Baseline  Deferred to next session: 04/29/2018; 05/01/2018: 1433ft; 08/21/2018: 1431ft; 09/19/2018: 1436ft; 10/21/2018  1621; 11/21/2018 deferred to next session 2/2 time.; 01/08/2019: 1480 ft; 02/25/2019: 13107ft; 04/10/2019: 1400 ft, 05/13/19: 1321 ft    Time  4    Period  Weeks    Status  On-going      PT LONG TERM GOAL #5   Title  Patient will be able to run 251ft without stopping to better be able to return to recreational activites and address cardiovascular endurance.    Baseline  43ft; 02/25/2019: 59ft; 04/10/2019 deferred    Time  4    Period  Weeks    Status  On-going      PT LONG TERM GOAL #6   Title  Patient will improve L hip flexion strength to 4/5 for improved steppage gait and reduced fall risk.    Baseline  04/10/2019: L hip flexion 3+/5    Time  6    Period  Weeks    Status  New            Plan - 07/01/19 1734    Clinical Impression Statement  Performed exercises in standing with focusing on improving strengthening and stabilization with compound exercises. Performed intervals to continue improving LE strength and power to respond to LOB while walking. Patient demonstrates improved foot clearance compared to previous sessions, however, continues to have increased onset of fatigue with activity. Patient will benefit from further skilled therapy focused on improving limitations to return to prior level of function and decrease fall risk.    Personal Factors and Comorbidities  Past/Current Experience    Examination-Activity Limitations  Locomotion Level    Examination-Participation Restrictions  Community Activity    Stability/Clinical Decision Making  Stable/Uncomplicated    Rehab Potential  Good    Clinical Impairments Affecting Rehab Potential  (-) hx of MS; (+) good motivation    PT Frequency  2x / week    PT Duration  4 weeks    PT Treatment/Interventions  Moist Heat;Iontophoresis 4mg /ml Dexamethasone;Electrical Stimulation;Aquatic Therapy;Cryotherapy;Therapeutic activities;Neuromuscular re-education;Therapeutic exercise;Patient/family education;Dry needling;Manual techniques;Passive  range of motion;Balance training;Functional mobility training;Gait training;Stair training;Joint Manipulations    PT Next Visit Plan  Continue addressing static (eyes closed), dynamic balance exercises, plyometric power movements, as well as incorporating more dual tasking and coordination activities; update HEP    PT Home Exercise Plan  hip flexion in sitting for strengthening,  hip flexion knee flexion in standing for balance, bike intervals at 70%-75% max HR    Consulted and Agree with Plan of Care  Patient       Patient will benefit from skilled therapeutic intervention in order to improve the following deficits and impairments:  Pain, Decreased coordination, Impaired perceived functional ability, Increased fascial restricitons, Increased muscle spasms, Difficulty walking, Abnormal gait, Decreased balance, Decreased mobility, Impaired sensation  Visit Diagnosis: Difficulty in walking, not elsewhere classified  Muscle weakness (generalized)  History of falling     Problem List There are no problems to display for this patient.   [name], PT DPT 07/02/2019, 9:40 AM  [facility] [address], [place], [address] Phone: [phone]   Fax:  [phone]  Name: YEIRA HOFFNER MRN: [id number] Date of Birth: [birth year]

## 2019-07-07 ENCOUNTER — Ambulatory Visit: Payer: BC Managed Care – PPO

## 2019-07-07 ENCOUNTER — Other Ambulatory Visit: Payer: Self-pay

## 2019-07-07 DIAGNOSIS — R262 Difficulty in walking, not elsewhere classified: Secondary | ICD-10-CM | POA: Diagnosis not present

## 2019-07-07 DIAGNOSIS — Z9181 History of falling: Secondary | ICD-10-CM

## 2019-07-07 DIAGNOSIS — M6281 Muscle weakness (generalized): Secondary | ICD-10-CM

## 2019-07-07 NOTE — Therapy (Signed)
[place] [facility] [address] [place], [address] Phone: [phone]   Fax:  [phone]  Physical Therapy Treatment  Patient Details  Name: Michelle Ruiz MRN: [id number] Date of Birth: 02/26/1971 No data recorded  Encounter Date: 07/07/2019  PT End of Session - 07/07/19 1725    Visit Number  71    Number of Visits  92    Date for PT Re-Evaluation  07/08/19    Authorization Type  6/10    PT Start Time  [id number]    PT Stop Time  1800    PT Time Calculation (min)  45 min    Equipment Utilized During Treatment  Gait belt    Activity Tolerance  Patient tolerated treatment well;No increased pain;Patient limited by fatigue    Behavior During Therapy  [facility] for tasks assessed/performed       Past Medical History:  Diagnosis Date  . Abnormal Pap smear of cervix   . Anxiety   . Kidney stones   . MS (multiple sclerosis) ([place])   . UTI (urinary tract infection)     Past Surgical History:  Procedure Laterality Date  . BREAST BIOPSY Right 2010   stereotactic biopsy/ neg/ dr.byrnett  . BREAST BIOPSY Left 2012   neg/dr. brynett  . CESAREAN SECTION  2008   RPH  . TONSILLECTOMY  2006    There were no vitals filed for this visit.  Subjective Assessment - 07/07/19 1723    Subjective  Patient reports she is trying to stop nicotine which has been challenging. Patinet states she is more irritated because of this.    Patient is accompained by:  Family member    Pertinent History  Patient reports history of falls x3 in the past 6 months secondary to drop foot on the L LE. PMH: Multiple sclerosis; elbow fracture of the R UE    Limitations  Standing;Walking    How long can you stand comfortably?  20 min    How long can you walk comfortably?  1 miles    Patient Stated Goals  To be more balanced and core strengthening     Currently in Pain?  No/denies    Pain Onset  More than a month ago         TREATMENT Therapeutic  Exercise Squats in standing with TRX -  x 1 min Hip swings abduction/adduction; extension/flexion - x1 min Hip abduction with GTB around ankles - 1 min B Lunges in standing with UE support - x 1 min B Step ups with 12" step - x 1 min B Side steps with 12" step - x 1 min B Side hops in standing (small lateral bounding) - x 1 min Running man in standing with UE support - x 74min   Performed exercises to improve LE strength and decrease fall risk    PT Education - 07/07/19 1725    Education Details  form/technique with exercise    Person(s) Educated  Patient    Methods  Explanation;Demonstration    Comprehension  Verbalized understanding;Returned demonstration          PT Long Term Goals - 05/27/19 1711      PT LONG TERM GOAL #1   Title  Patient will be independent with balance HEP to continue benefits of therapy after discharge.    Baseline  dependent with form/technique for balance exercise; 03/25/2018: Independent     Time  6    Period  Weeks    Status  Achieved      PT LONG TERM GOAL #2   Title  Patient will improve FGA by 4 points to indicate significant improvement with balancing while ambulating and decrease of fall risk    Baseline  03/25/2018: 28/30; 04/10/2019: 22    Time  6    Period  Weeks    Status  On-going      PT LONG TERM GOAL #3   Title  Patient will be able to perform prolonged single leg stance >10sec with eyes closed to indicate improvement with static balance and decrease fall risk    Baseline  2 sec in standing EC; 03/25/2018: 3 sec; 04/29/2018: 4 sec; 08/21/2018: 5 sec; 09/19/2018: 6 sec ; 10/21/2018 3 sec; 11/21/2018 5 sec; 01/08/2019: 9 sec; 02/25/2019: 14sec    Time  4    Period  Weeks    Status  Achieved      PT LONG TERM GOAL #4   Title  Pt will improve 6 MWT >2051ft to demonstrate improved cardiorespiratory fitness    Baseline  Deferred to next session: 04/29/2018; 05/01/2018: 1461ft; 08/21/2018: 1443ft; 09/19/2018: 1467ft; 10/21/2018 1621; 11/21/2018  deferred to next session 2/2 time.; 01/08/2019: 1480 ft; 02/25/2019: 1311ft; 04/10/2019: 1400 ft, 05/13/19: 1321 ft    Time  4    Period  Weeks    Status  On-going      PT LONG TERM GOAL #5   Title  Patient will be able to run 245ft without stopping to better be able to return to recreational activites and address cardiovascular endurance.    Baseline  26ft; 02/25/2019: 46ft; 04/10/2019 deferred    Time  4    Period  Weeks    Status  On-going      PT LONG TERM GOAL #6   Title  Patient will improve L hip flexion strength to 4/5 for improved steppage gait and reduced fall risk.    Baseline  04/10/2019: L hip flexion 3+/5    Time  6    Period  Weeks    Status  New            Plan - 07/07/19 1730    Clinical Impression Statement  Patient demonstrates improvement with exercise with ability to perform step ups from a higher height compared to previous sesssions. Although patient is improving, she continues to have increased difficulty with performing exercises before onset of fatigue requiring sitting rest breaks to regain energy levels. Patient will benefit from further skilled therapy to return to prior level of function.    Personal Factors and Comorbidities  Past/Current Experience    Examination-Activity Limitations  Locomotion Level    Examination-Participation Restrictions  Community Activity    Stability/Clinical Decision Making  Stable/Uncomplicated    Rehab Potential  Good    Clinical Impairments Affecting Rehab Potential  (-) hx of MS; (+) good motivation    PT Frequency  2x / week    PT Duration  4 weeks    PT Treatment/Interventions  Moist Heat;Iontophoresis 4mg /ml Dexamethasone;Electrical Stimulation;Aquatic Therapy;Cryotherapy;Therapeutic activities;Neuromuscular re-education;Therapeutic exercise;Patient/family education;Dry needling;Manual techniques;Passive range of motion;Balance training;Functional mobility training;Gait training;Stair training;Joint Manipulations    PT  Next Visit Plan  Continue addressing static (eyes closed), dynamic balance exercises, plyometric power movements, as well as incorporating more dual tasking and coordination activities; update HEP    PT Home Exercise Plan  hip flexion in sitting for strengthening, hip flexion knee flexion in standing for balance, bike intervals at 70%-75%  max HR    Consulted and Agree with Plan of Care  Patient       Patient will benefit from skilled therapeutic intervention in order to improve the following deficits and impairments:  Pain, Decreased coordination, Impaired perceived functional ability, Increased fascial restricitons, Increased muscle spasms, Difficulty walking, Abnormal gait, Decreased balance, Decreased mobility, Impaired sensation  Visit Diagnosis: Difficulty in walking, not elsewhere classified  Muscle weakness (generalized)  History of falling     Problem List There are no problems to display for this patient.   [name], PT DPT 07/07/2019, 5:38 PM  [place] [facility] [address] [place], [address] Phone: [phone]   Fax:  [phone]  Name: Michelle Ruiz MRN: [id number] Date of Birth: 1970-10-29

## 2019-07-08 ENCOUNTER — Ambulatory Visit (INDEPENDENT_AMBULATORY_CARE_PROVIDER_SITE_OTHER): Payer: Self-pay

## 2019-07-08 DIAGNOSIS — Z872 Personal history of diseases of the skin and subcutaneous tissue: Secondary | ICD-10-CM

## 2019-07-08 DIAGNOSIS — I781 Nevus, non-neoplastic: Secondary | ICD-10-CM

## 2019-07-08 DIAGNOSIS — L578 Other skin changes due to chronic exposure to nonionizing radiation: Secondary | ICD-10-CM

## 2019-07-08 DIAGNOSIS — L719 Rosacea, unspecified: Secondary | ICD-10-CM

## 2019-07-14 ENCOUNTER — Ambulatory Visit: Payer: BC Managed Care – PPO

## 2019-07-21 ENCOUNTER — Other Ambulatory Visit: Payer: Self-pay

## 2019-07-21 ENCOUNTER — Ambulatory Visit: Payer: BC Managed Care – PPO

## 2019-07-21 DIAGNOSIS — R262 Difficulty in walking, not elsewhere classified: Secondary | ICD-10-CM | POA: Diagnosis not present

## 2019-07-21 DIAGNOSIS — M6281 Muscle weakness (generalized): Secondary | ICD-10-CM

## 2019-07-21 DIAGNOSIS — Z9181 History of falling: Secondary | ICD-10-CM

## 2019-07-21 NOTE — Therapy (Addendum)
[place] [facility] [address] [place], [address] Phone: [phone]   Fax:  [phone]  Physical Therapy Treatment/ Progress Note  Patient Details  Name: Michelle Ruiz MRN: [id number] Date of Birth: May 20, 1970 No data recorded Reporting period: 04/10/2019 - 07/21/2019  Encounter Date: 07/21/2019  PT End of Session - 07/21/19 1755    Visit Number  84    Number of Visits  92    Date for PT Re-Evaluation  09/15/19    Authorization Type  7/10    PT Start Time  1700    PT Stop Time  1745    PT Time Calculation (min)  45 min    Equipment Utilized During Treatment  Gait belt    Activity Tolerance  Patient tolerated treatment well;No increased pain;Patient limited by fatigue    Behavior During Therapy  [facility] for tasks assessed/performed       Past Medical History:  Diagnosis Date  . Abnormal Pap smear of cervix   . Anxiety   . Kidney stones   . MS (multiple sclerosis) ([place])   . UTI (urinary tract infection)     Past Surgical History:  Procedure Laterality Date  . BREAST BIOPSY Right 2010   stereotactic biopsy/ neg/ dr.byrnett  . BREAST BIOPSY Left 2012   neg/dr. brynett  . CESAREAN SECTION  2008   RPH  . TONSILLECTOMY  2006    There were no vitals filed for this visit.  Subjective Assessment - 07/21/19 1759    Subjective  Patient states she has been feeling increased fatigue since receiving her COVID 19 vaccine. Patient states she has not been able to walk as efficienctly since the vaccine.    Patient is accompained by:  Family member    Pertinent History  Patient reports history of falls x3 in the past 6 months secondary to drop foot on the L LE. PMH: Multiple sclerosis; elbow fracture of the R UE    Limitations  Standing;Walking    How long can you stand comfortably?  20 min    How long can you walk comfortably?  1 miles    Patient Stated Goals  To be more balanced and core strengthening     Currently  in Pain?  No/denies    Pain Onset  More than a month ago       TREATMENT Therapeutic Exercise Lunges in standing without UE support - x 20 B [place] in standing crossing over each foot forward/backward to address balance difficulties - 35 ft x 3 B Ambulation with focus on improving gait speed and step length/height - 1115ft Tandem ambulation with focus on performing a normal gait speed - 3 x 15 ft to address strength Backwards walking with CGA - x 20  EC ambulation to address proprioception difficulties - x39ft B LE jumping without UE support - 2 x 5 Unilateral LE jump without UE support - x 5 B Performed exercises to decrease pain and spasms   PT Education - 07/21/19 1803    Education Details  form/technique with exercise    Person(s) Educated  Patient    Methods  Explanation;Demonstration    Comprehension  Verbalized understanding;Returned demonstration          PT Long Term Goals - 07/21/19 1721      PT LONG TERM GOAL #1   Title  Patient will be independent with balance HEP to continue benefits of therapy after discharge.    Baseline  dependent with form/technique for balance exercise; 03/25/2018: Independent     Time  6    Period  Weeks    Status  Achieved      PT LONG TERM GOAL #2   Title  Patient will improve FGA by 4 points to indicate significant improvement with balancing while ambulating and decrease of fall risk    Baseline  03/25/2018: 28/30; 04/10/2019: 22; 07/21/2019: 29    Time  6    Period  Weeks    Status  On-going      PT LONG TERM GOAL #3   Title  Patient will be able to perform prolonged single leg stance >10sec with eyes closed to indicate improvement with static balance and decrease fall risk    Baseline  2 sec in standing EC; 03/25/2018: 3 sec; 04/29/2018: 4 sec; 08/21/2018: 5 sec; 09/19/2018: 6 sec ; 10/21/2018 3 sec; 11/21/2018 5 sec; 01/08/2019: 9 sec; 02/25/2019: 14sec    Time  4    Period  Weeks    Status  Achieved      PT LONG TERM GOAL #4    Title  Pt will improve 6 MWT >2042ft to demonstrate improved cardiorespiratory fitness    Baseline  Deferred to next session: 04/29/2018; 05/01/2018: 1446ft; 08/21/2018: 1413ft; 09/19/2018: 1473ft; 10/21/2018 1621; 11/21/2018 deferred to next session 2/2 time.; 01/08/2019: 1480 ft; 02/25/2019: 1387ft; 04/10/2019: 1400 ft, 05/13/19: 1321 ft; 07/21/2019: 1170 ft    Time  4    Period  Weeks    Status  On-going      PT LONG TERM GOAL #5   Title  Patient will be able to run 289ft without stopping to better be able to return to recreational activites and address cardiovascular endurance.    Baseline  57ft; 02/25/2019: 69ft; 04/10/2019 deferred    Time  4    Period  Weeks    Status  Deferred      PT LONG TERM GOAL #6   Title  Patient will improve L hip flexion strength to 4/5 for improved steppage gait and reduced fall risk.    Baseline  04/10/2019: L hip flexion 3+/5; 07/21/2019: L Hip 4/5    Time  6    Period  Weeks    Status  Achieved            Plan - 07/21/19 1756    Clinical Impression Statement  Patient making progress towards long term goals with improvement in FGA and hip strength compared to previous session indicating functional carryover and improvement in dynamic balance. Although patient is improving, she scored less on 58mwt compared to rpevious session. This is most likely from increae in fatigue from the COVID 19 vaccine she received the previous week. Patient demonstrates decreased endurance with movement and will benefit from further skilled therapy to return to prior level of function.    Personal Factors and Comorbidities  Past/Current Experience    Examination-Activity Limitations  Locomotion Level    Examination-Participation Restrictions  Community Activity    Stability/Clinical Decision Making  Stable/Uncomplicated    Rehab Potential  Good    Clinical Impairments Affecting Rehab Potential  (-) hx of MS; (+) good motivation    PT Frequency  2x / week    PT Duration  4 weeks     PT Treatment/Interventions  Moist Heat;Iontophoresis 4mg /ml Dexamethasone;Electrical Stimulation;Aquatic Therapy;Cryotherapy;Therapeutic activities;Neuromuscular re-education;Therapeutic exercise;Patient/family education;Dry needling;Manual techniques;Passive range of motion;Balance training;Functional mobility training;Gait training;Stair training;Joint Manipulations    PT Next Visit Plan  Continue  addressing static (eyes closed), dynamic balance exercises, plyometric power movements, as well as incorporating more dual tasking and coordination activities; update HEP    PT Home Exercise Plan  hip flexion in sitting for strengthening, hip flexion knee flexion in standing for balance, bike intervals at 70%-75% max HR    Consulted and Agree with Plan of Care  Patient       Patient will benefit from skilled therapeutic intervention in order to improve the following deficits and impairments:  Pain, Decreased coordination, Impaired perceived functional ability, Increased fascial restricitons, Increased muscle spasms, Difficulty walking, Abnormal gait, Decreased balance, Decreased mobility, Impaired sensation  Visit Diagnosis: Difficulty in walking, not elsewhere classified  Muscle weakness (generalized)  History of falling     Problem List There are no problems to display for this patient.   [name], PT DPT 07/21/2019, 6:04 PM  [place] [facility] [address], [place], [address] Phone: [phone]   Fax:  [phone]  Name: Michelle Ruiz MRN: [id number] Date of Birth: 1970-05-12

## 2019-07-28 ENCOUNTER — Ambulatory Visit: Payer: BC Managed Care – PPO | Attending: Neurology

## 2019-07-28 ENCOUNTER — Other Ambulatory Visit: Payer: Self-pay

## 2019-07-28 DIAGNOSIS — M6281 Muscle weakness (generalized): Secondary | ICD-10-CM

## 2019-07-28 DIAGNOSIS — R262 Difficulty in walking, not elsewhere classified: Secondary | ICD-10-CM | POA: Diagnosis not present

## 2019-07-28 NOTE — Therapy (Signed)
[place] [facility] [address] [place], [address] Phone: [phone]   Fax:  [phone]  Physical Therapy Treatment  Patient Details  Name: Michelle Ruiz MRN: [id number] Date of Birth: 09/22/1970 No data recorded  Encounter Date: 07/28/2019  PT End of Session - 07/28/19 1716    Visit Number  89    Number of Visits  92    Date for PT Re-Evaluation  09/15/19    Authorization Type  1/10    PT Start Time  1700    PT Stop Time  1745    PT Time Calculation (min)  45 min    Equipment Utilized During Treatment  Gait belt    Activity Tolerance  Patient tolerated treatment well;No increased pain;Patient limited by fatigue    Behavior During Therapy  [facility] for tasks assessed/performed       Past Medical History:  Diagnosis Date  . Abnormal Pap smear of cervix   . Anxiety   . Kidney stones   . MS (multiple sclerosis) ([place])   . UTI (urinary tract infection)     Past Surgical History:  Procedure Laterality Date  . BREAST BIOPSY Right 2010   stereotactic biopsy/ neg/ dr.byrnett  . BREAST BIOPSY Left 2012   neg/dr. brynett  . CESAREAN SECTION  2008   RPH  . TONSILLECTOMY  2006    There were no vitals filed for this visit.  Subjective Assessment - 07/28/19 1712    Subjective  Patient reports she has been performing her walking and is feeling less overall fatigue compared to previous session.    Patient is accompained by:  Family member    Pertinent History  Patient reports history of falls x3 in the past 6 months secondary to drop foot on the L LE. PMH: Multiple sclerosis; elbow fracture of the R UE    Limitations  Standing;Walking    How long can you stand comfortably?  20 min    How long can you walk comfortably?  1 miles    Patient Stated Goals  To be more balanced and core strengthening     Currently in Pain?  No/denies    Pain Onset  More than a month ago       TREATMENT Therapeutic Exercise Sit to  stands with use of TRX straps - 2 x 10 Hip swings in standing laterally/forward/backward - 2 x 10 Push ups against table - x10 Lunges - x 10 forward Lateral lunges in standing - x 20 Heel lifts in standing - x 20  Feet together ball toss on airex pad -- x 20   Performed exercises to improve LE strength   PT Education - 07/28/19 1715    Education Details  form/technique with exercise    Person(s) Educated  Patient    Methods  Explanation;Demonstration    Comprehension  Verbalized understanding;Returned demonstration          PT Long Term Goals - 07/21/19 1721      PT LONG TERM GOAL #1   Title  Patient will be independent with balance HEP to continue benefits of therapy after discharge.    Baseline  dependent with form/technique for balance exercise; 03/25/2018: Independent     Time  6    Period  Weeks    Status  Achieved      PT LONG TERM GOAL #2   Title  Patient will improve FGA by 4 points to indicate significant improvement  with balancing while ambulating and decrease of fall risk    Baseline  03/25/2018: 28/30; 04/10/2019: 22; 07/21/2019: 29    Time  6    Period  Weeks    Status  On-going      PT LONG TERM GOAL #3   Title  Patient will be able to perform prolonged single leg stance >10sec with eyes closed to indicate improvement with static balance and decrease fall risk    Baseline  2 sec in standing EC; 03/25/2018: 3 sec; 04/29/2018: 4 sec; 08/21/2018: 5 sec; 09/19/2018: 6 sec ; 10/21/2018 3 sec; 11/21/2018 5 sec; 01/08/2019: 9 sec; 02/25/2019: 14sec    Time  4    Period  Weeks    Status  Achieved      PT LONG TERM GOAL #4   Title  Pt will improve 6 MWT >202ft to demonstrate improved cardiorespiratory fitness    Baseline  Deferred to next session: 04/29/2018; 05/01/2018: 1429ft; 08/21/2018: 1423ft; 09/19/2018: 142ft; 10/21/2018 1621; 11/21/2018 deferred to next session 2/2 time.; 01/08/2019: 1480 ft; 02/25/2019: 1377ft; 04/10/2019: 1400 ft, 05/13/19: 1321 ft; 07/21/2019: 1170 ft     Time  4    Period  Weeks    Status  On-going      PT LONG TERM GOAL #5   Title  Patient will be able to run 234ft without stopping to better be able to return to recreational activites and address cardiovascular endurance.    Baseline  74ft; 02/25/2019: 55ft; 04/10/2019 deferred    Time  4    Period  Weeks    Status  Deferred      PT LONG TERM GOAL #6   Title  Patient will improve L hip flexion strength to 4/5 for improved steppage gait and reduced fall risk.    Baseline  04/10/2019: L hip flexion 3+/5; 07/21/2019: L Hip 4/5    Time  6    Period  Weeks    Status  Achieved            Plan - 07/28/19 1725    Clinical Impression Statement  Focused on improving LE strength during today's session. Patient demonstrates poor strength requiring frequent sitting breaks during the session indicating both a decrease in strength and LE endurance. After performing exercises patient demonstrates difficulty with performing foot clearance in standing. Patient will benefit from further skilled therapy to return to prior level of function.    Personal Factors and Comorbidities  Past/Current Experience    Examination-Activity Limitations  Locomotion Level    Examination-Participation Restrictions  Community Activity    Stability/Clinical Decision Making  Stable/Uncomplicated    Rehab Potential  Good    Clinical Impairments Affecting Rehab Potential  (-) hx of MS; (+) good motivation    PT Frequency  2x / week    PT Duration  4 weeks    PT Treatment/Interventions  Moist Heat;Iontophoresis 4mg /ml Dexamethasone;Electrical Stimulation;Aquatic Therapy;Cryotherapy;Therapeutic activities;Neuromuscular re-education;Therapeutic exercise;Patient/family education;Dry needling;Manual techniques;Passive range of motion;Balance training;Functional mobility training;Gait training;Stair training;Joint Manipulations    PT Next Visit Plan  Continue addressing static (eyes closed), dynamic balance exercises,  plyometric power movements, as well as incorporating more dual tasking and coordination activities; update HEP    PT Home Exercise Plan  hip flexion in sitting for strengthening, hip flexion knee flexion in standing for balance, bike intervals at 70%-75% max HR    Consulted and Agree with Plan of Care  Patient       Patient will benefit from skilled therapeutic intervention  in order to improve the following deficits and impairments:  Pain, Decreased coordination, Impaired perceived functional ability, Increased fascial restricitons, Increased muscle spasms, Difficulty walking, Abnormal gait, Decreased balance, Decreased mobility, Impaired sensation  Visit Diagnosis: Difficulty in walking, not elsewhere classified  Muscle weakness (generalized)     Problem List There are no problems to display for this patient.   [name], PT DPT 07/28/2019, 5:34 PM  [place] [facility] [address], [place], [address] Phone: [phone]   Fax:  [phone]  Name: ANAM HANIF MRN: [id number] Date of Birth: Mar 02, 1971

## 2019-08-04 ENCOUNTER — Other Ambulatory Visit: Payer: Self-pay

## 2019-08-04 ENCOUNTER — Ambulatory Visit: Payer: BC Managed Care – PPO

## 2019-08-04 DIAGNOSIS — R262 Difficulty in walking, not elsewhere classified: Secondary | ICD-10-CM

## 2019-08-04 DIAGNOSIS — M6281 Muscle weakness (generalized): Secondary | ICD-10-CM

## 2019-08-04 NOTE — Therapy (Addendum)
[facility] [address], [place], [address] Phone: [phone]   Fax:  [phone]  Physical Therapy Treatment  Patient Details  Name: Michelle Ruiz MRN: [id number] Date of Birth: 1970-05-20 No data recorded  Encounter Date: 08/04/2019  PT End of Session - 08/04/19 1713    Visit Number  90    Number of Visits  92    Date for PT Re-Evaluation  09/15/19    Authorization Type  2/10    PT Start Time  1700    PT Stop Time  1745    PT Time Calculation (min)  45 min    Equipment Utilized During Treatment  Gait belt    Activity Tolerance  Patient tolerated treatment well;No increased pain;Patient limited by fatigue    Behavior During Therapy  [facility] for tasks assessed/performed       Past Medical History:  Diagnosis Date  . Abnormal Pap smear of cervix   . Anxiety   . Kidney stones   . MS (multiple sclerosis) ([place])   . UTI (urinary tract infection)     Past Surgical History:  Procedure Laterality Date  . BREAST BIOPSY Right 2010   stereotactic biopsy/ neg/ dr.byrnett  . BREAST BIOPSY Left 2012   neg/dr. brynett  . CESAREAN SECTION  2008   RPH  . TONSILLECTOMY  2006    There were no vitals filed for this visit.  Subjective Assessment - 08/04/19 1704    Subjective  Patient states she has been walking and continues to exercise. Patient reports she has been using less nicotine in the form of gum and the fatigue has been improved.    Patient is accompained by:  Family member    Pertinent History  Patient reports history of falls x3 in the past 6 months secondary to drop foot on the L LE. PMH: Multiple sclerosis; elbow fracture of the R UE    Limitations  Standing;Walking    How long can you stand comfortably?  20 min    How long can you walk comfortably?  1 miles    Patient Stated Goals  To be more balanced and core strengthening     Currently in Pain?  No/denies    Pain Onset  More than a month ago                 TREATMENT Therapeutic Exercise Sit to stands with use of TRX straps - 2 x 10 Biking at level 10  -- x5 min Hip swings in standing laterally/forward/backward - 2 x 10 Hops in standing with UE support - 2 x10 Lateral lunges in standing - x 10 Sit to stands - x 5 15#; x 5 10#   Performed exercises to improve LE strength      PT Education - 08/04/19 1712    Education Details  form/technique with exercise    Person(s) Educated  Patient    Methods  Explanation;Demonstration    Comprehension  Verbalized understanding;Returned demonstration          PT Long Term Goals - 07/21/19 1721      PT LONG TERM GOAL #1   Title  Patient will be independent with balance HEP to continue benefits of therapy after discharge.    Baseline  dependent with form/technique for balance exercise; 03/25/2018: Independent     Time  6    Period  Weeks    Status  Achieved  PT LONG TERM GOAL #2   Title  Patient will improve FGA by 4 points to indicate significant improvement with balancing while ambulating and decrease of fall risk    Baseline  03/25/2018: 28/30; 04/10/2019: 22; 07/21/2019: 29    Time  6    Period  Weeks    Status  On-going      PT LONG TERM GOAL #3   Title  Patient will be able to perform prolonged single leg stance >10sec with eyes closed to indicate improvement with static balance and decrease fall risk    Baseline  2 sec in standing EC; 03/25/2018: 3 sec; 04/29/2018: 4 sec; 08/21/2018: 5 sec; 09/19/2018: 6 sec ; 10/21/2018 3 sec; 11/21/2018 5 sec; 01/08/2019: 9 sec; 02/25/2019: 14sec    Time  4    Period  Weeks    Status  Achieved      PT LONG TERM GOAL #4   Title  Pt will improve 6 MWT >2071ft to demonstrate improved cardiorespiratory fitness    Baseline  Deferred to next session: 04/29/2018; 05/01/2018: 1457ft; 08/21/2018: 1441ft; 09/19/2018: 1459ft; 10/21/2018 1621; 11/21/2018 deferred to next session 2/2 time.; 01/08/2019: 1480 ft; 02/25/2019: 1344ft; 04/10/2019:  1400 ft, 05/13/19: 1321 ft; 07/21/2019: 1170 ft    Time  4    Period  Weeks    Status  On-going      PT LONG TERM GOAL #5   Title  Patient will be able to run 269ft without stopping to better be able to return to recreational activites and address cardiovascular endurance.    Baseline  69ft; 02/25/2019: 42ft; 04/10/2019 deferred    Time  4    Period  Weeks    Status  Deferred      PT LONG TERM GOAL #6   Title  Patient will improve L hip flexion strength to 4/5 for improved steppage gait and reduced fall risk.    Baseline  04/10/2019: L hip flexion 3+/5; 07/21/2019: L Hip 4/5    Time  6    Period  Weeks    Status  Achieved            Plan - 08/04/19 1718    Clinical Impression Statement  Continued to address LE strengthening with performing lunges and dynamic movements to improve dynamic balance and strength to respond to LOB while walking. Patient demonstrates ability to perform hopping activites however fatigues quickly requring frequent sitting rest breaks. This may be secodnary to increased temperature in the gym. Patient will benefit from further skilled therapy to return to proir level of function.    Personal Factors and Comorbidities  Past/Current Experience    Examination-Activity Limitations  Locomotion Level    Examination-Participation Restrictions  Community Activity    Stability/Clinical Decision Making  Stable/Uncomplicated    Rehab Potential  Good    Clinical Impairments Affecting Rehab Potential  (-) hx of MS; (+) good motivation    PT Frequency  2x / week    PT Duration  4 weeks    PT Treatment/Interventions  Moist Heat;Iontophoresis 4mg /ml Dexamethasone;Electrical Stimulation;Aquatic Therapy;Cryotherapy;Therapeutic activities;Neuromuscular re-education;Therapeutic exercise;Patient/family education;Dry needling;Manual techniques;Passive range of motion;Balance training;Functional mobility training;Gait training;Stair training;Joint Manipulations    PT Next Visit Plan   Continue addressing static (eyes closed), dynamic balance exercises, plyometric power movements, as well as incorporating more dual tasking and coordination activities; update HEP    PT Home Exercise Plan  hip flexion in sitting for strengthening, hip flexion knee flexion in standing for balance, bike intervals at  70%-75% max HR    Consulted and Agree with Plan of Care  Patient       Patient will benefit from skilled therapeutic intervention in order to improve the following deficits and impairments:  Pain, Decreased coordination, Impaired perceived functional ability, Increased fascial restricitons, Increased muscle spasms, Difficulty walking, Abnormal gait, Decreased balance, Decreased mobility, Impaired sensation  Visit Diagnosis: Difficulty in walking, not elsewhere classified  Muscle weakness (generalized)     Problem List There are no problems to display for this patient.   [place] Aaleigha Bozza 08/04/2019, 5:49 PM  [facility] [address] [place], [address] Phone: [phone]   Fax:  [phone]  Name: EBONNIE STAHELI MRN: [id number] Date of Birth: 1970/10/01

## 2019-08-11 ENCOUNTER — Other Ambulatory Visit: Payer: Self-pay

## 2019-08-11 ENCOUNTER — Ambulatory Visit: Payer: BC Managed Care – PPO

## 2019-08-11 DIAGNOSIS — M6281 Muscle weakness (generalized): Secondary | ICD-10-CM

## 2019-08-11 DIAGNOSIS — R262 Difficulty in walking, not elsewhere classified: Secondary | ICD-10-CM

## 2019-08-11 NOTE — Therapy (Signed)
[facility] [address] [place], [address] Phone: [phone]   Fax:  [phone]  Physical Therapy Treatment  Patient Details  Name: Michelle Ruiz MRN: [id number] Date of Birth: 24-Oct-1970 No data recorded  Encounter Date: 08/11/2019  PT End of Session - 08/11/19 1721    Visit Number  91    Number of Visits  92    Date for PT Re-Evaluation  09/15/19    Authorization Type  3/10    PT Start Time  1700    PT Stop Time  1745    PT Time Calculation (min)  45 min    Equipment Utilized During Treatment  Gait belt    Activity Tolerance  Patient tolerated treatment well;No increased pain;Patient limited by fatigue    Behavior During Therapy  [facility] for tasks assessed/performed       Past Medical History:  Diagnosis Date  . Abnormal Pap smear of cervix   . Anxiety   . Kidney stones   . MS (multiple sclerosis) ([place])   . UTI (urinary tract infection)     Past Surgical History:  Procedure Laterality Date  . BREAST BIOPSY Right 2010   stereotactic biopsy/ neg/ dr.byrnett  . BREAST BIOPSY Left 2012   neg/dr. brynett  . CESAREAN SECTION  2008   RPH  . TONSILLECTOMY  2006    There were no vitals filed for this visit.  Subjective Assessment - 08/11/19 1706    Subjective  Patient reports she is having difficulty with walking as she feels increased difficulty with lifitng the L LE for progressing the LE forward.    Patient is accompained by:  Family member    Pertinent History  Patient reports history of falls x3 in the past 6 months secondary to drop foot on the L LE. PMH: Multiple sclerosis; elbow fracture of the R UE    Limitations  Standing;Walking    How long can you stand comfortably?  20 min    How long can you walk comfortably?  1 miles    Patient Stated Goals  To be more balanced and core strengthening     Currently in Pain?  No/denies    Pain Onset  More than a month ago            TREATMENT Therapeutic Exercise Static Skipping with UE support - x 20  High step taps onto 8" with focus on improving speed - x 20  Running man with unilateral UE support - x20  Hip swings in standing forward/backward - x 10  B High step with step ups - x 10 B Step over 4" hurdles (6 hurdles) - x 7 down and back   Performed exercises to improve LE strength  PT Education - 08/11/19 1721    Education Details  form/technique with exercise    Person(s) Educated  Patient    Methods  Explanation;Demonstration    Comprehension  Verbalized understanding;Returned demonstration          PT Long Term Goals - 07/21/19 1721      PT LONG TERM GOAL #1   Title  Patient will be independent with balance HEP to continue benefits of therapy after discharge.    Baseline  dependent with form/technique for balance exercise; 03/25/2018: Independent     Time  6    Period  Weeks    Status  Achieved      PT LONG TERM GOAL #2  Title  Patient will improve FGA by 4 points to indicate significant improvement with balancing while ambulating and decrease of fall risk    Baseline  03/25/2018: 28/30; 04/10/2019: 22; 07/21/2019: 29    Time  6    Period  Weeks    Status  On-going      PT LONG TERM GOAL #3   Title  Patient will be able to perform prolonged single leg stance >10sec with eyes closed to indicate improvement with static balance and decrease fall risk    Baseline  2 sec in standing EC; 03/25/2018: 3 sec; 04/29/2018: 4 sec; 08/21/2018: 5 sec; 09/19/2018: 6 sec ; 10/21/2018 3 sec; 11/21/2018 5 sec; 01/08/2019: 9 sec; 02/25/2019: 14sec    Time  4    Period  Weeks    Status  Achieved      PT LONG TERM GOAL #4   Title  Pt will improve 6 MWT >2031ft to demonstrate improved cardiorespiratory fitness    Baseline  Deferred to next session: 04/29/2018; 05/01/2018: 1454ft; 08/21/2018: 1448ft; 09/19/2018: 1451ft; 10/21/2018 1621; 11/21/2018 deferred to next session 2/2 time.; 01/08/2019: 1480 ft; 02/25/2019: 1327ft;  04/10/2019: 1400 ft, 05/13/19: 1321 ft; 07/21/2019: 1170 ft    Time  4    Period  Weeks    Status  On-going      PT LONG TERM GOAL #5   Title  Patient will be able to run 240ft without stopping to better be able to return to recreational activites and address cardiovascular endurance.    Baseline  39ft; 02/25/2019: 53ft; 04/10/2019 deferred    Time  4    Period  Weeks    Status  Deferred      PT LONG TERM GOAL #6   Title  Patient will improve L hip flexion strength to 4/5 for improved steppage gait and reduced fall risk.    Baseline  04/10/2019: L hip flexion 3+/5; 07/21/2019: L Hip 4/5    Time  6    Period  Weeks    Status  Achieved            Plan - 08/11/19 1730    Clinical Impression Statement  Focused on improving stepping and improving hip flexion with gait patterns. Patient with decreaed ability to perform stepping actions with foot clearnace on the affected side secondary to limitations secondary to MS. Patient able to improve this with focus on peforming step taps to an external cue indicating musuclar coordination limitation. Patient will benefit from further skilled therapy to return to prior leve lof function.    Personal Factors and Comorbidities  Past/Current Experience    Examination-Activity Limitations  Locomotion Level    Examination-Participation Restrictions  Community Activity    Stability/Clinical Decision Making  Stable/Uncomplicated    Rehab Potential  Good    Clinical Impairments Affecting Rehab Potential  (-) hx of MS; (+) good motivation    PT Frequency  2x / week    PT Duration  4 weeks    PT Treatment/Interventions  Moist Heat;Iontophoresis 4mg /ml Dexamethasone;Electrical Stimulation;Aquatic Therapy;Cryotherapy;Therapeutic activities;Neuromuscular re-education;Therapeutic exercise;Patient/family education;Dry needling;Manual techniques;Passive range of motion;Balance training;Functional mobility training;Gait training;Stair training;Joint Manipulations     PT Next Visit Plan  Continue addressing static (eyes closed), dynamic balance exercises, plyometric power movements, as well as incorporating more dual tasking and coordination activities; update HEP    PT Home Exercise Plan  hip flexion in sitting for strengthening, hip flexion knee flexion in standing for balance, bike intervals at 70%-75% max HR  Consulted and Agree with Plan of Care  Patient       Patient will benefit from skilled therapeutic intervention in order to improve the following deficits and impairments:  Pain, Decreased coordination, Impaired perceived functional ability, Increased fascial restricitons, Increased muscle spasms, Difficulty walking, Abnormal gait, Decreased balance, Decreased mobility, Impaired sensation  Visit Diagnosis: Difficulty in walking, not elsewhere classified  Muscle weakness (generalized)     Problem List There are no problems to display for this patient.   [name], PT DPT 08/11/2019, 5:49 PM  [place] [facility] [address] [place], [address] Phone: [phone]   Fax:  [phone]  Name: Michelle Ruiz MRN: [id number] Date of Birth: 08-27-1970

## 2019-08-18 ENCOUNTER — Ambulatory Visit: Payer: BC Managed Care – PPO

## 2019-08-18 ENCOUNTER — Other Ambulatory Visit: Payer: Self-pay

## 2019-08-18 DIAGNOSIS — R262 Difficulty in walking, not elsewhere classified: Secondary | ICD-10-CM

## 2019-08-18 DIAGNOSIS — M6281 Muscle weakness (generalized): Secondary | ICD-10-CM

## 2019-08-18 NOTE — Therapy (Signed)
[facility] [address], [place], [address] Phone: [phone]   Fax:  [phone]  Physical Therapy Treatment  Patient Details  Name: Michelle Ruiz MRN: [id number] Date of Birth: 06/03/1970 No data recorded  Encounter Date: 08/18/2019  PT End of Session - 08/18/19 1722    Visit Number  92    Number of Visits  92    Date for PT Re-Evaluation  09/15/19    Authorization Type  4/10    PT Start Time  [id number]    PT Stop Time  1745    PT Time Calculation (min)  40 min    Equipment Utilized During Treatment  Gait belt    Activity Tolerance  Patient tolerated treatment well;No increased pain;Patient limited by fatigue    Behavior During Therapy  [facility] for tasks assessed/performed       Past Medical History:  Diagnosis Date  . Abnormal Pap smear of cervix   . Anxiety   . Kidney stones   . MS (multiple sclerosis) ([place])   . UTI (urinary tract infection)     Past Surgical History:  Procedure Laterality Date  . BREAST BIOPSY Right 2010   stereotactic biopsy/ neg/ dr.byrnett  . BREAST BIOPSY Left 2012   neg/dr. brynett  . CESAREAN SECTION  2008   RPH  . TONSILLECTOMY  2006    There were no vitals filed for this visit.  Subjective Assessment - 08/18/19 1711    Subjective  Patient states she is going to the neurologist to assess her reaction to the vaccine. Patient reports she has been walking and riding her bike for exercise.    Patient is accompained by:  Family member    Pertinent History  Patient reports history of falls x3 in the past 6 months secondary to drop foot on the L LE. PMH: Multiple sclerosis; elbow fracture of the R UE    Limitations  Standing;Walking    How long can you stand comfortably?  20 min    How long can you walk comfortably?  1 miles    Patient Stated Goals  To be more balanced and core strengthening     Currently in Pain?  No/denies    Pain Onset  More than a month ago                       TREATMENT Therapeutic Exercise Hip swings in standing forward/backward - x 20  B Sit to stands with TRX straps with hops at top - x 5 to address power Sit to stands with TRX straps - x 15 Monster walks with side stepping with RTB - 3 x 10 B High step taps onto 12" with focus on improving speed - x 10; 16" x 10 with L  High step with step ups - x 10 B Step over 4" hurdles (6 hurdles) - x 7 down and back with airex beam   Performed exercises to improve LE strength     PT Education - 08/18/19 1719    Education Details  form/technique with exercise    Person(s) Educated  Patient    Methods  Explanation;Demonstration    Comprehension  Verbalized understanding;Returned demonstration          PT Long Term Goals - 07/21/19 1721      PT LONG TERM GOAL #1   Title  Patient will be independent with balance HEP to continue benefits of therapy  after discharge.    Baseline  dependent with form/technique for balance exercise; 03/25/2018: Independent     Time  6    Period  Weeks    Status  Achieved      PT LONG TERM GOAL #2   Title  Patient will improve FGA by 4 points to indicate significant improvement with balancing while ambulating and decrease of fall risk    Baseline  03/25/2018: 28/30; 04/10/2019: 22; 07/21/2019: 29    Time  6    Period  Weeks    Status  On-going      PT LONG TERM GOAL #3   Title  Patient will be able to perform prolonged single leg stance >10sec with eyes closed to indicate improvement with static balance and decrease fall risk    Baseline  2 sec in standing EC; 03/25/2018: 3 sec; 04/29/2018: 4 sec; 08/21/2018: 5 sec; 09/19/2018: 6 sec ; 10/21/2018 3 sec; 11/21/2018 5 sec; 01/08/2019: 9 sec; 02/25/2019: 14sec    Time  4    Period  Weeks    Status  Achieved      PT LONG TERM GOAL #4   Title  Pt will improve 6 MWT >2085ft to demonstrate improved cardiorespiratory fitness    Baseline  Deferred to next session: 04/29/2018; 05/01/2018: 1437ft;  08/21/2018: 1413ft; 09/19/2018: 143ft; 10/21/2018 1621; 11/21/2018 deferred to next session 2/2 time.; 01/08/2019: 1480 ft; 02/25/2019: 1374ft; 04/10/2019: 1400 ft, 05/13/19: 1321 ft; 07/21/2019: 1170 ft    Time  4    Period  Weeks    Status  On-going      PT LONG TERM GOAL #5   Title  Patient will be able to run 225ft without stopping to better be able to return to recreational activites and address cardiovascular endurance.    Baseline  68ft; 02/25/2019: 24ft; 04/10/2019 deferred    Time  4    Period  Weeks    Status  Deferred      PT LONG TERM GOAL #6   Title  Patient will improve L hip flexion strength to 4/5 for improved steppage gait and reduced fall risk.    Baseline  04/10/2019: L hip flexion 3+/5; 07/21/2019: L Hip 4/5    Time  6    Period  Weeks    Status  Achieved            Plan - 08/18/19 1735    Clinical Impression Statement  Improvement in hip flexion after cueing to perform with increased speed indicating improvement in power and motor control. Patient continues to have increased difficulty with performing dynamic balance exercises. Patient will benefit from further skilled therapy to return to prior level of function.    Personal Factors and Comorbidities  Past/Current Experience    Examination-Activity Limitations  Locomotion Level    Examination-Participation Restrictions  Community Activity    Stability/Clinical Decision Making  Stable/Uncomplicated    Rehab Potential  Good    Clinical Impairments Affecting Rehab Potential  (-) hx of MS; (+) good motivation    PT Frequency  2x / week    PT Duration  4 weeks    PT Treatment/Interventions  Moist Heat;Iontophoresis 4mg /ml Dexamethasone;Electrical Stimulation;Aquatic Therapy;Cryotherapy;Therapeutic activities;Neuromuscular re-education;Therapeutic exercise;Patient/family education;Dry needling;Manual techniques;Passive range of motion;Balance training;Functional mobility training;Gait training;Stair training;Joint  Manipulations    PT Next Visit Plan  Continue addressing static (eyes closed), dynamic balance exercises, plyometric power movements, as well as incorporating more dual tasking and coordination activities; update HEP    PT Home  Exercise Plan  hip flexion in sitting for strengthening, hip flexion knee flexion in standing for balance, bike intervals at 70%-75% max HR    Consulted and Agree with Plan of Care  Patient       Patient will benefit from skilled therapeutic intervention in order to improve the following deficits and impairments:  Pain, Decreased coordination, Impaired perceived functional ability, Increased fascial restricitons, Increased muscle spasms, Difficulty walking, Abnormal gait, Decreased balance, Decreased mobility, Impaired sensation  Visit Diagnosis: Difficulty in walking, not elsewhere classified  Muscle weakness (generalized)     Problem List There are no problems to display for this patient.   [name], PT DPT 08/18/2019, 5:42 PM  [facility] [address] [place], [address] Phone: [phone]   Fax:  [phone]  Name: MALIBU ARIZOLA MRN: [id number] Date of Birth: [birth year]

## 2019-08-28 ENCOUNTER — Other Ambulatory Visit: Payer: Self-pay | Admitting: Obstetrics and Gynecology

## 2019-08-28 DIAGNOSIS — Z1231 Encounter for screening mammogram for malignant neoplasm of breast: Secondary | ICD-10-CM

## 2019-09-03 ENCOUNTER — Ambulatory Visit: Payer: BC Managed Care – PPO

## 2019-09-08 ENCOUNTER — Ambulatory Visit: Payer: BC Managed Care – PPO | Attending: Neurology

## 2019-09-08 ENCOUNTER — Other Ambulatory Visit: Payer: Self-pay

## 2019-09-08 DIAGNOSIS — Z9181 History of falling: Secondary | ICD-10-CM | POA: Diagnosis present

## 2019-09-08 DIAGNOSIS — M6281 Muscle weakness (generalized): Secondary | ICD-10-CM

## 2019-09-08 DIAGNOSIS — R262 Difficulty in walking, not elsewhere classified: Secondary | ICD-10-CM | POA: Diagnosis present

## 2019-09-08 DIAGNOSIS — M25521 Pain in right elbow: Secondary | ICD-10-CM

## 2019-09-08 DIAGNOSIS — M25621 Stiffness of right elbow, not elsewhere classified: Secondary | ICD-10-CM | POA: Diagnosis present

## 2019-09-08 NOTE — Therapy (Signed)
[place] [facility] [address] [place], [address] Phone: [phone]   Fax:  [phone]  Physical Therapy Treatment  Patient Details  Name: Michelle Ruiz MRN: [id number] Date of Birth: 1970-10-10 No data recorded  Encounter Date: 09/08/2019   PT End of Session - 09/08/19 1713    Visit Number 93    Number of Visits 92    Date for PT Re-Evaluation 09/15/19    PT Start Time 0109    PT Stop Time 1745    PT Time Calculation (min) 40 min    Equipment Utilized During Treatment Gait belt    Activity Tolerance Patient tolerated treatment well;No increased pain;Patient limited by fatigue    Behavior During Therapy [facility] for tasks assessed/performed           Past Medical History:  Diagnosis Date  . Abnormal Pap smear of cervix   . Anxiety   . Kidney stones   . MS (multiple sclerosis) ([place])   . UTI (urinary tract infection)     Past Surgical History:  Procedure Laterality Date  . BREAST BIOPSY Right 2010   stereotactic biopsy/ neg/ dr.byrnett  . BREAST BIOPSY Left 2012   neg/dr. brynett  . CESAREAN SECTION  2008   RPH  . TONSILLECTOMY  2006    There were no vitals filed for this visit.   Subjective Assessment - 09/08/19 1710    Subjective Pt has been off for a few weeks due to scheduling changes and high clinic volume. Pt reports some recent exacerbation of foot drop, but has not had any falls. Pt continues to walk adn bike for exercise.    Patient is accompained by: Family member    Pertinent History Patient reports history of falls x3 in the past 6 months secondary to drop foot on the L LE. PMH: Multiple sclerosis; elbow fracture of the R UE    Limitations Standing;Walking    Currently in Pain? No/denies          INTERVENTION THIS DATE:  Hip swings in standing forward/backward - x 20  B Sit to stands with TRX straps with hops at top 2x10 c "hop at top" Monster walks with side stepping with RTB - 3  x 10 B High step taps onto 12" with focus on improving speed - 2x10;   Step over 4" hurdles (6 hurdles) -2x12 c gait belt, min guard assist  Airex normal stance balance 5x10sec at minGuardA level      PT Long Term Goals - 07/21/19 1721      PT LONG TERM GOAL #1   Title Patient will be independent with balance HEP to continue benefits of therapy after discharge.    Baseline dependent with form/technique for balance exercise; 03/25/2018: Independent     Time 6    Period Weeks    Status Achieved      PT LONG TERM GOAL #2   Title Patient will improve FGA by 4 points to indicate significant improvement with balancing while ambulating and decrease of fall risk    Baseline 03/25/2018: 28/30; 04/10/2019: 22; 07/21/2019: 29    Time 6    Period Weeks    Status On-going      PT LONG TERM GOAL #3   Title Patient will be able to perform prolonged single leg stance >10sec with eyes closed to indicate improvement with static balance and decrease fall risk    Baseline 2 sec in standing EC;  03/25/2018: 3 sec; 04/29/2018: 4 sec; 08/21/2018: 5 sec; 09/19/2018: 6 sec ; 10/21/2018 3 sec; 11/21/2018 5 sec; 01/08/2019: 9 sec; 02/25/2019: 14sec    Time 4    Period Weeks    Status Achieved      PT LONG TERM GOAL #4   Title Pt will improve 6 MWT >2061ft to demonstrate improved cardiorespiratory fitness    Baseline Deferred to next session: 04/29/2018; 05/01/2018: 1493ft; 08/21/2018: 1431ft; 09/19/2018: 1469ft; 10/21/2018 1621; 11/21/2018 deferred to next session 2/2 time.; 01/08/2019: 1480 ft; 02/25/2019: 1344ft; 04/10/2019: 1400 ft, 05/13/19: 1321 ft; 07/21/2019: 1170 ft    Time 4    Period Weeks    Status On-going      PT LONG TERM GOAL #5   Title Patient will be able to run 281ft without stopping to better be able to return to recreational activites and address cardiovascular endurance.    Baseline 5ft; 02/25/2019: 50ft; 04/10/2019 deferred    Time 4    Period Weeks    Status Deferred      PT LONG TERM GOAL #6    Title Patient will improve L hip flexion strength to 4/5 for improved steppage gait and reduced fall risk.    Baseline 04/10/2019: L hip flexion 3+/5; 07/21/2019: L Hip 4/5    Time 6    Period Weeks    Status Achieved                 Plan - 09/08/19 1718    Clinical Impression Statement Continued with current plan of care, gently progressing patient's program aimed at address deficits and limitations identified in evaluation. Pt does fatigue quickly from a cardiovascular standpoint, but is given adequate time for recovery between interventions. Fatigue of Left hip flexion is more apparent throughout session. Pt continues to make steady progress toward treatment goals in general. Author provides extensive verbal, visual, and tactile cues when needed to assure all interventions are performed with desired form and good accuracy. Extensive communicaiton to assure pt is able to perform all activities without exacerbation of pain or other symptoms.    Personal Factors and Comorbidities Past/Current Experience    Examination-Activity Limitations Locomotion Level    Examination-Participation Restrictions Community Activity    Stability/Clinical Decision Making Stable/Uncomplicated    Clinical Decision Making Low    Rehab Potential Good    Clinical Impairments Affecting Rehab Potential (-) hx of MS; (+) good motivation    PT Frequency 2x / week    PT Duration 4 weeks    PT Treatment/Interventions Moist Heat;Iontophoresis 4mg /ml Dexamethasone;Electrical Stimulation;Aquatic Therapy;Cryotherapy;Therapeutic activities;Neuromuscular re-education;Therapeutic exercise;Patient/family education;Dry needling;Manual techniques;Passive range of motion;Balance training;Functional mobility training;Gait training;Stair training;Joint Manipulations    PT Next Visit Plan Continue addressing static (eyes closed), dynamic balance exercises, plyometric power movements, as well as incorporating more dual tasking and  coordination activities; update HEP    PT Home Exercise Plan hip flexion in sitting for strengthening, hip flexion knee flexion in standing for balance, bike intervals at 70%-75% max HR    Consulted and Agree with Plan of Care Patient           Patient will benefit from skilled therapeutic intervention in order to improve the following deficits and impairments:  Pain, Decreased coordination, Impaired perceived functional ability, Increased fascial restricitons, Increased muscle spasms, Difficulty walking, Abnormal gait, Decreased balance, Decreased mobility, Impaired sensation  Visit Diagnosis: Difficulty in walking, not elsewhere classified  Muscle weakness (generalized)  History of falling  Pain in right elbow  Stiffness of  right elbow, not elsewhere classified     Problem List There are no problems to display for this patient.  5:25 PM, 09/08/19 Etta Grandchild, PT, DPT Physical Therapist - [place] [phone] (Office)   Breklyn Fabrizio C 09/08/2019, 5:24 PM  [place] [facility] [address], [place], [address] Phone: [phone]   Fax:  [phone]  Name: Michelle Ruiz MRN: [id number] Date of Birth: 1970/10/08

## 2019-09-15 ENCOUNTER — Ambulatory Visit
Admission: RE | Admit: 2019-09-15 | Discharge: 2019-09-15 | Disposition: A | Discharge status: Home / Self Care | Payer: BC Managed Care – PPO | Source: Ambulatory Visit | Attending: Obstetrics and Gynecology | Admitting: Obstetrics and Gynecology

## 2019-09-15 DIAGNOSIS — Z1231 Encounter for screening mammogram for malignant neoplasm of breast: Secondary | ICD-10-CM

## 2019-09-16 ENCOUNTER — Ambulatory Visit: Payer: BC Managed Care – PPO

## 2019-09-16 ENCOUNTER — Other Ambulatory Visit: Payer: Self-pay

## 2019-09-16 DIAGNOSIS — R262 Difficulty in walking, not elsewhere classified: Secondary | ICD-10-CM

## 2019-09-16 DIAGNOSIS — Z9181 History of falling: Secondary | ICD-10-CM

## 2019-09-16 DIAGNOSIS — M6281 Muscle weakness (generalized): Secondary | ICD-10-CM

## 2019-09-17 NOTE — Therapy (Addendum)
[place] [facility] [address] [place], [address] Phone: [phone]   Fax:  [phone]  Physical Therapy Treatment/ Progress Note  Patient Details  Name: Michelle Ruiz MRN: [id number] Date of Birth: 19-Jul-1970 No data recorded  Progress Note: 07/21/2019 - 09/16/2019  Encounter Date: 09/16/2019   PT End of Session - 09/17/19 1533    Visit Number 60    Number of Visits 103    Date for PT Re-Evaluation 09/15/19    PT Start Time 1700    PT Stop Time 1745    PT Time Calculation (min) 45 min    Equipment Utilized During Treatment Gait belt    Activity Tolerance Patient tolerated treatment well;No increased pain;Patient limited by fatigue    Behavior During Therapy [facility] for tasks assessed/performed           Past Medical History:  Diagnosis Date  . Abnormal Pap smear of cervix   . Anxiety   . Kidney stones   . MS (multiple sclerosis) ([place])   . UTI (urinary tract infection)     Past Surgical History:  Procedure Laterality Date  . BREAST BIOPSY Right 2010   stereotactic biopsy/ neg/ dr.byrnett  . BREAST BIOPSY Left 2012   neg/dr. brynett  . CESAREAN SECTION  2008   RPH  . TONSILLECTOMY  2006    There were no vitals filed for this visit.   Subjective Assessment - 09/17/19 1531    Subjective Patient states increased difficulty with lifting her feet and performing hip flexion. Patient states she continues to have foot drop on the affected side.    Patient is accompained by: Family member    Pertinent History Patient reports history of falls x3 in the past 6 months secondary to drop foot on the L LE. PMH: Multiple sclerosis; elbow fracture of the R UE    Limitations Standing;Walking    Currently in Pain? No/denies              TREATMENT Therapeutic Exercise Sit to stand with TRX straps - x 20  Ambulation with focus on increasing step height - x 1ft Ambulation EC, backwards, tandem -  32ft Lateral lunges with TRX - x 20  Reverse lunges with TRX - x 20  Foot taps onto 14" step without UE support - x 20   Performed exercises to decrease pain and spasms    PT Education - 09/17/19 1532    Education Details form/technique with exercise;    Person(s) Educated Patient    Methods Explanation;Demonstration    Comprehension Verbalized understanding;Returned demonstration               PT Long Term Goals - 09/17/19 1559      PT LONG TERM GOAL #1   Title Patient will be independent with balance HEP to continue benefits of therapy after discharge.    Baseline dependent with form/technique for balance exercise; 03/25/2018: Independent     Time 6    Period Weeks    Status Achieved      PT LONG TERM GOAL #2   Title Patient will improve FGA by 4 points to indicate significant improvement with balancing while ambulating and decrease of fall risk    Baseline 03/25/2018: 28/30; 04/10/2019: 22; 07/21/2019: 29; 09/17/2019: 30/30    Time 6    Period Weeks    Status Achieved      PT LONG TERM GOAL #3   Title Patient will  be able to perform prolonged single leg stance >10sec with eyes closed to indicate improvement with static balance and decrease fall risk    Baseline 2 sec in standing EC; 03/25/2018: 3 sec; 04/29/2018: 4 sec; 08/21/2018: 5 sec; 09/19/2018: 6 sec ; 10/21/2018 3 sec; 11/21/2018 5 sec; 01/08/2019: 9 sec; 02/25/2019: 14sec    Time 4    Period Weeks    Status Achieved      PT LONG TERM GOAL #4   Title Pt will improve 6 MWT >2016ft to demonstrate improved cardiorespiratory fitness    Baseline Deferred to next session: 04/29/2018; 05/01/2018: 1461ft; 08/21/2018: 1474ft; 09/19/2018: 1461ft; 10/21/2018 1621; 11/21/2018 deferred to next session 2/2 time.; 01/08/2019: 1480 ft; 02/25/2019: 1364ft; 04/10/2019: 1400 ft, 05/13/19: 1321 ft; 07/21/2019: 1170 ft; 09/17/2019: 1227ft    Time 4    Period Weeks    Status On-going      PT LONG TERM GOAL #5   Title Patient will be able to run  221ft without stopping to better be able to return to recreational activites and address cardiovascular endurance.    Baseline 20ft; 02/25/2019: 71ft; 04/10/2019 deferred    Time 4    Period Weeks    Status Deferred      PT LONG TERM GOAL #6   Title Patient will improve L hip flexion strength to 4/5 for improved steppage gait and reduced fall risk.    Baseline 04/10/2019: L hip flexion 3+/5; 07/21/2019: L Hip 4/5    Time 6    Period Weeks    Status Achieved      PT LONG TERM GOAL #7   Title Patient will be able to lift her affected L LE to the same extent as the R LE to allow for greater amount of foot clearance when ambulating    Baseline L LE: 10", R LE: 15"    Time 8    Period Weeks    Status New                 Plan - 09/17/19 1533    Clinical Impression Statement Patient making improvement towards long term goals with an improvement in FGA and 77minWT compared to the previous session. Although patient is improving she continues to have increased difficulty with performing hip flexion and dorsiflexion onto the affected side. Patient demonstrates increased difficutly with walking. Patient will benefit from further skilled therapy to return to prior level of function.    Personal Factors and Comorbidities Past/Current Experience    Examination-Activity Limitations Locomotion Level    Examination-Participation Restrictions Community Activity    Stability/Clinical Decision Making Stable/Uncomplicated    Rehab Potential Good    Clinical Impairments Affecting Rehab Potential (-) hx of MS; (+) good motivation    PT Frequency 2x / week    PT Duration 4 weeks    PT Treatment/Interventions Moist Heat;Iontophoresis 4mg /ml Dexamethasone;Electrical Stimulation;Aquatic Therapy;Cryotherapy;Therapeutic activities;Neuromuscular re-education;Therapeutic exercise;Patient/family education;Dry needling;Manual techniques;Passive range of motion;Balance training;Functional mobility training;Gait  training;Stair training;Joint Manipulations    PT Next Visit Plan Continue addressing static (eyes closed), dynamic balance exercises, plyometric power movements, as well as incorporating more dual tasking and coordination activities; update HEP    PT Home Exercise Plan hip flexion in sitting for strengthening, hip flexion knee flexion in standing for balance, bike intervals at 70%-75% max HR    Consulted and Agree with Plan of Care Patient           Patient will benefit from skilled therapeutic intervention in order to improve  the following deficits and impairments:  Pain, Decreased coordination, Impaired perceived functional ability, Increased fascial restricitons, Increased muscle spasms, Difficulty walking, Abnormal gait, Decreased balance, Decreased mobility, Impaired sensation  Visit Diagnosis: Difficulty in walking, not elsewhere classified  Muscle weakness (generalized)  History of falling     Problem List There are no problems to display for this patient.   [name], PT DPT 09/17/2019, 4:03 PM  [place] [facility] [address] [place], [address] Phone: [phone]   Fax:  [phone]  Name: Michelle Ruiz MRN: [id number] Date of Birth: Nov 21, 1970

## 2019-09-23 ENCOUNTER — Other Ambulatory Visit: Payer: Self-pay

## 2019-09-23 ENCOUNTER — Ambulatory Visit: Payer: BC Managed Care – PPO

## 2019-09-23 DIAGNOSIS — R262 Difficulty in walking, not elsewhere classified: Secondary | ICD-10-CM

## 2019-09-23 DIAGNOSIS — M6281 Muscle weakness (generalized): Secondary | ICD-10-CM

## 2019-09-23 DIAGNOSIS — Z9181 History of falling: Secondary | ICD-10-CM

## 2019-09-23 NOTE — Therapy (Signed)
[place] [facility] [address] [place], [address] Phone: [phone]   Fax:  [phone]  Physical Therapy Treatment  Patient Details  Name: Michelle Ruiz MRN: [id number] Date of Birth: [birth year] No data recorded  Encounter Date: 09/23/2019   PT End of Session - 09/23/19 1703    Visit Number 95    Number of Visits 103    Authorization Type 5/10    PT Start Time 1700    PT Stop Time 1745    PT Time Calculation (min) 45 min    Equipment Utilized During Treatment Gait belt    Activity Tolerance Patient tolerated treatment well;No increased pain;Patient limited by fatigue    Behavior During Therapy [facility] for tasks assessed/performed           Past Medical History:  Diagnosis Date  . Abnormal Pap smear of cervix   . Anxiety   . Kidney stones   . MS (multiple sclerosis) ([place])   . UTI (urinary tract infection)     Past Surgical History:  Procedure Laterality Date  . BREAST BIOPSY Right 2010   stereotactic biopsy/ neg/ dr.byrnett  . BREAST BIOPSY Left 2012   neg/dr. brynett  . CESAREAN SECTION  2008   RPH  . TONSILLECTOMY  2006    There were no vitals filed for this visit.   Subjective Assessment - 09/23/19 1659    Subjective Pt reports that she had some soreness in her R knee but it is now gone.    Pertinent History Patient reports history of falls x3 in the past 6 months secondary to drop foot on the L LE. PMH: Multiple sclerosis; elbow fracture of the R UE    Limitations Standing;Walking    How long can you stand comfortably? 20 min    How long can you walk comfortably? 1 miles    Patient Stated Goals To be more balanced and core strengthening     Currently in Pain? No/denies    Pain Onset More than a month ago          INTERVENTIONS  Therapeutic Exercise Ambulation with focus on increasing step height - x 58ft Reverse lunges with TRX - x 20 (total) Foot taps onto 14" step without UE  support - 2x10 bilat.  Lateral Step-ups x10 bilat. Sit to stands with TRX straps going up on toes at the top 2x10  Monster walks with side stepping with RTB - 2 x 10 B Step over 4" hurdles (6 hurdles) - x 7 down and back with airex beam Airex normal stance balance 5x10sec at minGuardA level  Airex ball toss forward, Left side, and Right side minGuardA x63min each  Performed exercise to improve strength and balance         PT Education - 09/23/19 1701    Education Details form/technique with exercise    Person(s) Educated Patient    Methods Explanation    Comprehension Verbalized understanding;Returned demonstration               PT Long Term Goals - 09/17/19 1559      PT LONG TERM GOAL #1   Title Patient will be independent with balance HEP to continue benefits of therapy after discharge.    Baseline dependent with form/technique for balance exercise; 03/25/2018: Independent     Time 6    Period Weeks    Status Achieved      PT LONG TERM GOAL #2  Title Patient will improve FGA by 4 points to indicate significant improvement with balancing while ambulating and decrease of fall risk    Baseline 03/25/2018: 28/30; 04/10/2019: 22; 07/21/2019: 29; 09/17/2019: 30/30    Time 6    Period Weeks    Status Achieved      PT LONG TERM GOAL #3   Title Patient will be able to perform prolonged single leg stance >10sec with eyes closed to indicate improvement with static balance and decrease fall risk    Baseline 2 sec in standing EC; 03/25/2018: 3 sec; 04/29/2018: 4 sec; 08/21/2018: 5 sec; 09/19/2018: 6 sec ; 10/21/2018 3 sec; 11/21/2018 5 sec; 01/08/2019: 9 sec; 02/25/2019: 14sec    Time 4    Period Weeks    Status Achieved      PT LONG TERM GOAL #4   Title Pt will improve 6 MWT >2032ft to demonstrate improved cardiorespiratory fitness    Baseline Deferred to next session: 04/29/2018; 05/01/2018: 1458ft; 08/21/2018: 1474ft; 09/19/2018: 1462ft; 10/21/2018 1621; 11/21/2018 deferred to next  session 2/2 time.; 01/08/2019: 1480 ft; 02/25/2019: 1363ft; 04/10/2019: 1400 ft, 05/13/19: 1321 ft; 07/21/2019: 1170 ft; 09/17/2019: 1217ft    Time 4    Period Weeks    Status On-going      PT LONG TERM GOAL #5   Title Patient will be able to run 257ft without stopping to better be able to return to recreational activites and address cardiovascular endurance.    Baseline 66ft; 02/25/2019: 48ft; 04/10/2019 deferred    Time 4    Period Weeks    Status Deferred      PT LONG TERM GOAL #6   Title Patient will improve L hip flexion strength to 4/5 for improved steppage gait and reduced fall risk.    Baseline 04/10/2019: L hip flexion 3+/5; 07/21/2019: L Hip 4/5    Time 6    Period Weeks    Status Achieved      PT LONG TERM GOAL #7   Title Patient will be able to lift her affected L LE to the same extent as the R LE to allow for greater amount of foot clearance when ambulating    Baseline L LE: 10", R LE: 15"    Time 8    Period Weeks    Status New                 Plan - 09/23/19 1703    Clinical Impression Statement Pt continues to have difficulty with initiating affected hip flexion and dorsiflexion during gait.  Continuing to address deficits by working on LE strength and balance exercises to help pt with walking out in community and home. Pt will benefit from skilled PT to help return to PLOF.    Personal Factors and Comorbidities Past/Current Experience    Examination-Activity Limitations Locomotion Level    Examination-Participation Restrictions Community Activity    Stability/Clinical Decision Making Stable/Uncomplicated    Clinical Decision Making Low    Rehab Potential Good    Clinical Impairments Affecting Rehab Potential (-) hx of MS; (+) good motivation    PT Frequency 2x / week    PT Duration 4 weeks    PT Treatment/Interventions Moist Heat;Iontophoresis 4mg /ml Dexamethasone;Electrical Stimulation;Aquatic Therapy;Cryotherapy;Therapeutic activities;Neuromuscular  re-education;Therapeutic exercise;Patient/family education;Dry needling;Manual techniques;Passive range of motion;Balance training;Functional mobility training;Gait training;Stair training;Joint Manipulations    PT Next Visit Plan Continue addressing static (eyes closed), dynamic balance exercises, plyometric power movements, as well as incorporating more dual tasking and coordination activities; update HEP  PT Home Exercise Plan hip flexion in sitting for strengthening, hip flexion knee flexion in standing for balance, bike intervals at 70%-75% max HR    Consulted and Agree with Plan of Care Patient           Patient will benefit from skilled therapeutic intervention in order to improve the following deficits and impairments:  Pain, Decreased coordination, Impaired perceived functional ability, Increased fascial restricitons, Increased muscle spasms, Difficulty walking, Abnormal gait, Decreased balance, Decreased mobility, Impaired sensation  Visit Diagnosis: Difficulty in walking, not elsewhere classified  Muscle weakness (generalized)  History of falling     Problem List There are no problems to display for this patient.  5:45 PM, 09/23/19 Margarito Liner, SPT Student Physical Therapist [place]  [phone]  Michelle Ruiz 09/23/2019, 5:29 PM  [place] [facility] [address], [place], [address] Phone: [phone]   Fax:  [phone]  Name: Michelle Ruiz MRN: [id number] Date of Birth: [birth year]

## 2019-09-24 NOTE — Addendum Note (Signed)
Addended by: Blain Pais on: 09/24/2019 10:58 AM   Modules accepted: Orders

## 2019-10-02 ENCOUNTER — Ambulatory Visit: Payer: BC Managed Care – PPO | Attending: Neurology

## 2019-10-02 ENCOUNTER — Other Ambulatory Visit: Payer: Self-pay

## 2019-10-02 DIAGNOSIS — R262 Difficulty in walking, not elsewhere classified: Secondary | ICD-10-CM | POA: Diagnosis present

## 2019-10-02 DIAGNOSIS — M6281 Muscle weakness (generalized): Secondary | ICD-10-CM

## 2019-10-02 DIAGNOSIS — Z9181 History of falling: Secondary | ICD-10-CM | POA: Insufficient documentation

## 2019-10-02 NOTE — Therapy (Signed)
[place] [facility] [address] [place], [address] Phone: [phone]   Fax:  [phone]  Physical Therapy Treatment  Patient Details  Name: Michelle Ruiz MRN: [id number] Date of Birth: [birth year] No data recorded  Encounter Date: 10/02/2019   PT End of Session - 10/02/19 1703    Visit Number 96    Number of Visits 103    Date for PT Re-Evaluation 11/11/19    Authorization Type 6/10    PT Start Time 1700    PT Stop Time 1745    PT Time Calculation (min) 45 min    Equipment Utilized During Treatment Gait belt    Activity Tolerance Patient tolerated treatment well;No increased pain;Other (comment)    Behavior During Therapy [facility] for tasks assessed/performed;Anxious           Past Medical History:  Diagnosis Date  . Abnormal Pap smear of cervix   . Anxiety   . Kidney stones   . MS (multiple sclerosis) ([place])   . UTI (urinary tract infection)     Past Surgical History:  Procedure Laterality Date  . BREAST BIOPSY Right 2010   stereotactic biopsy/ neg/ dr.byrnett  . BREAST BIOPSY Left 2012   neg/dr. brynett  . CESAREAN SECTION  2008   [facility]  . TONSILLECTOMY  2006    There were no vitals filed for this visit.   Subjective Assessment - 10/02/19 1702    Subjective Patient reports she is feeling fine and has no issues at todays session    Patient is accompained by: Family member    Pertinent History Patient reports history of falls x3 in the past 6 months secondary to drop foot on the L LE. PMH: Multiple sclerosis; elbow fracture of the R UE    Limitations Standing;Walking    How long can you stand comfortably? 20 min    How long can you walk comfortably? 1 miles    Patient Stated Goals To be more balanced and core strengthening     Currently in Pain? No/denies    Pain Onset More than a month ago          INTERVENTIONS   Therapeutic Exercises  Ambulation with focus on increasing step height (hip  flexion) - x 132ft Lunges with UE Support - 2x10 bilat Hedgehog Steppingstone 7 x8 Foot taps onto 16" step without UE support - 2x10 bilat.  Monster walks with side stepping with RTB - 2 x 10 B STS from grey chair with no UE Support 2x10    Performed exercise to improve strength and balance     PT Education - 10/02/19 1703    Education Details form/technique with exercise    Person(s) Educated Patient    Methods Explanation;Demonstration    Comprehension Verbalized understanding;Returned demonstration               PT Long Term Goals - 09/17/19 1559      PT LONG TERM GOAL #1   Title Patient will be independent with balance HEP to continue benefits of therapy after discharge.    Baseline dependent with form/technique for balance exercise; 03/25/2018: Independent     Time 6    Period Weeks    Status Achieved      PT LONG TERM GOAL #2   Title Patient will improve FGA by 4 points to indicate significant improvement with balancing while ambulating and decrease of fall risk    Baseline 03/25/2018: 28/30; 04/10/2019:  22; 07/21/2019: 29; 09/17/2019: 30/30    Time 6    Period Weeks    Status Achieved      PT LONG TERM GOAL #3   Title Patient will be able to perform prolonged single leg stance >10sec with eyes closed to indicate improvement with static balance and decrease fall risk    Baseline 2 sec in standing EC; 03/25/2018: 3 sec; 04/29/2018: 4 sec; 08/21/2018: 5 sec; 09/19/2018: 6 sec ; 10/21/2018 3 sec; 11/21/2018 5 sec; 01/08/2019: 9 sec; 02/25/2019: 14sec    Time 4    Period Weeks    Status Achieved      PT LONG TERM GOAL #4   Title Pt will improve 6 MWT >2039ft to demonstrate improved cardiorespiratory fitness    Baseline Deferred to next session: 04/29/2018; 05/01/2018: 1450ft; 08/21/2018: 148ft; 09/19/2018: 1453ft; 10/21/2018 1621; 11/21/2018 deferred to next session 2/2 time.; 01/08/2019: 1480 ft; 02/25/2019: 1342ft; 04/10/2019: 1400 ft, 05/13/19: 1321 ft; 07/21/2019: 1170 ft;  09/17/2019: 1230ft    Time 4    Period Weeks    Status On-going      PT LONG TERM GOAL #5   Title Patient will be able to run 222ft without stopping to better be able to return to recreational activites and address cardiovascular endurance.    Baseline 74ft; 02/25/2019: 46ft; 04/10/2019 deferred    Time 4    Period Weeks    Status Deferred      PT LONG TERM GOAL #6   Title Patient will improve L hip flexion strength to 4/5 for improved steppage gait and reduced fall risk.    Baseline 04/10/2019: L hip flexion 3+/5; 07/21/2019: L Hip 4/5    Time 6    Period Weeks    Status Achieved      PT LONG TERM GOAL #7   Title Patient will be able to lift her affected L LE to the same extent as the R LE to allow for greater amount of foot clearance when ambulating    Baseline L LE: 10", R LE: 15"    Time 8    Period Weeks    Status New                 Plan - 10/02/19 1704    Clinical Impression Statement Continued focusing on LE strength and endurance to help improve gait along with balance.  Patient continues to have deficits with the initiating hip flexion to help clear her foot during ambulation.  Patient fatigues during exercises and requires many rest breaks to help relieve SOB and muscular fatigue.  Patient will benefit from skilled therapy to progress towards goals and retain strength, endurance, and balance.    Personal Factors and Comorbidities Past/Current Experience    Examination-Activity Limitations Locomotion Level    Examination-Participation Restrictions Community Activity    Stability/Clinical Decision Making Stable/Uncomplicated    Clinical Decision Making Low    Rehab Potential Good    Clinical Impairments Affecting Rehab Potential (-) hx of MS; (+) good motivation    PT Frequency 2x / week    PT Duration 4 weeks    PT Treatment/Interventions Moist Heat;Iontophoresis 4mg /ml Dexamethasone;Electrical Stimulation;Aquatic Therapy;Cryotherapy;Therapeutic  activities;Neuromuscular re-education;Therapeutic exercise;Patient/family education;Dry needling;Manual techniques;Passive range of motion;Balance training;Functional mobility training;Gait training;Stair training;Joint Manipulations    PT Next Visit Plan Continue addressing static (eyes closed), dynamic balance exercises, plyometric power movements, as well as incorporating more dual tasking and coordination activities; update HEP    PT Home Exercise Plan hip flexion in  sitting for strengthening, hip flexion knee flexion in standing for balance, bike intervals at 70%-75% max HR    Consulted and Agree with Plan of Care Patient           Patient will benefit from skilled therapeutic intervention in order to improve the following deficits and impairments:  Pain, Decreased coordination, Impaired perceived functional ability, Increased fascial restricitons, Increased muscle spasms, Difficulty walking, Abnormal gait, Decreased balance, Decreased mobility, Impaired sensation  Visit Diagnosis: Difficulty in walking, not elsewhere classified  Muscle weakness (generalized)  History of falling     Problem List There are no problems to display for this patient.  5:45 PM, 10/02/19 Margarito Liner, SPT Student Physical Therapist [place]  [phone]  Aniqua Briere 10/02/2019, 5:41 PM  [facility] [address], [place], [address] Phone: [phone]   Fax:  [phone]  Name: ESMERALDA MALAY MRN: [id number] Date of Birth: 05-13-1970

## 2019-10-08 ENCOUNTER — Ambulatory Visit: Payer: BC Managed Care – PPO

## 2019-10-08 ENCOUNTER — Other Ambulatory Visit: Payer: Self-pay

## 2019-10-08 DIAGNOSIS — Z9181 History of falling: Secondary | ICD-10-CM

## 2019-10-08 DIAGNOSIS — M6281 Muscle weakness (generalized): Secondary | ICD-10-CM

## 2019-10-08 DIAGNOSIS — R262 Difficulty in walking, not elsewhere classified: Secondary | ICD-10-CM

## 2019-10-08 NOTE — Therapy (Signed)
[place] [facility] [address] [place], [address] Phone: [phone]   Fax:  [phone]  Physical Therapy Treatment  Patient Details  Name: Michelle Ruiz MRN: [id number] Date of Birth: 05/31/1970 No data recorded  Encounter Date: 10/08/2019   PT End of Session - 10/08/19 1817    Visit Number 97    Number of Visits 103    Date for PT Re-Evaluation 11/11/19    Authorization Type 7/10    PT Start Time 1815    PT Stop Time 1900    PT Time Calculation (min) 45 min    Equipment Utilized During Treatment Gait belt    Activity Tolerance Patient tolerated treatment well;No increased pain;Other (comment)    Behavior During Therapy [facility] for tasks assessed/performed;Anxious           Past Medical History:  Diagnosis Date  . Abnormal Pap smear of cervix   . Anxiety   . Kidney stones   . MS (multiple sclerosis) ([place])   . UTI (urinary tract infection)     Past Surgical History:  Procedure Laterality Date  . BREAST BIOPSY Right 2010   stereotactic biopsy/ neg/ dr.byrnett  . BREAST BIOPSY Left 2012   neg/dr. brynett  . CESAREAN SECTION  2008   [facility]  . TONSILLECTOMY  2006    There were no vitals filed for this visit.   Subjective Assessment - 10/08/19 1814    Subjective Patient reports shes been recently having some LBP after waking up the past few mornings?    Patient is accompained by: Family member    Pertinent History Patient reports history of falls x3 in the past 6 months secondary to drop foot on the L LE. PMH: Multiple sclerosis; elbow fracture of the R UE    Limitations Standing;Walking    How long can you stand comfortably? 20 min    How long can you walk comfortably? 1 miles    Patient Stated Goals To be more balanced and core strengthening     Currently in Pain? No/denies    Pain Onset More than a month ago           INTERVENTIONS    Therapeutic Exercises  Ambulation with focus on  increasing step height (hip flexion with 2lb ankle weights - x 15ft Lunges with UE Support - 2x10 bilat AirEx Strip Tandem Stance 2x30sec  SL Stance on AirEx pad 1x30sec  STS from grey chair with no UE Support 2x10  Lateral Step Ups 8in step x12 bilat Hurdles (6 hurdles ~86ft apart) x3 down and back with 5 taps bilat. on 16inch step     Performed exercise to improve strength and balance       PT Education - 10/08/19 1817    Education Details form/technique with exercise    Person(s) Educated Patient    Methods Explanation;Demonstration    Comprehension Verbalized understanding;Returned demonstration               PT Long Term Goals - 09/17/19 1559      PT LONG TERM GOAL #1   Title Patient will be independent with balance HEP to continue benefits of therapy after discharge.    Baseline dependent with form/technique for balance exercise; 03/25/2018: Independent     Time 6    Period Weeks    Status Achieved      PT LONG TERM GOAL #2   Title Patient will improve FGA by 4 points  to indicate significant improvement with balancing while ambulating and decrease of fall risk    Baseline 03/25/2018: 28/30; 04/10/2019: 22; 07/21/2019: 29; 09/17/2019: 30/30    Time 6    Period Weeks    Status Achieved      PT LONG TERM GOAL #3   Title Patient will be able to perform prolonged single leg stance >10sec with eyes closed to indicate improvement with static balance and decrease fall risk    Baseline 2 sec in standing EC; 03/25/2018: 3 sec; 04/29/2018: 4 sec; 08/21/2018: 5 sec; 09/19/2018: 6 sec ; 10/21/2018 3 sec; 11/21/2018 5 sec; 01/08/2019: 9 sec; 02/25/2019: 14sec    Time 4    Period Weeks    Status Achieved      PT LONG TERM GOAL #4   Title Pt will improve 6 MWT >2033ft to demonstrate improved cardiorespiratory fitness    Baseline Deferred to next session: 04/29/2018; 05/01/2018: 1415ft; 08/21/2018: 1436ft; 09/19/2018: 1458ft; 10/21/2018 1621; 11/21/2018 deferred to next session 2/2 time.;  01/08/2019: 1480 ft; 02/25/2019: 1335ft; 04/10/2019: 1400 ft, 05/13/19: 1321 ft; 07/21/2019: 1170 ft; 09/17/2019: 1274ft    Time 4    Period Weeks    Status On-going      PT LONG TERM GOAL #5   Title Patient will be able to run 238ft without stopping to better be able to return to recreational activites and address cardiovascular endurance.    Baseline 2ft; 02/25/2019: 73ft; 04/10/2019 deferred    Time 4    Period Weeks    Status Deferred      PT LONG TERM GOAL #6   Title Patient will improve L hip flexion strength to 4/5 for improved steppage gait and reduced fall risk.    Baseline 04/10/2019: L hip flexion 3+/5; 07/21/2019: L Hip 4/5    Time 6    Period Weeks    Status Achieved      PT LONG TERM GOAL #7   Title Patient will be able to lift her affected L LE to the same extent as the R LE to allow for greater amount of foot clearance when ambulating    Baseline L LE: 10", R LE: 15"    Time 8    Period Weeks    Status New                 Plan - 10/08/19 1818    Clinical Impression Statement Continued to focus on LE strength and balance at todays session to address patient's deficits that are limiting her the most. Patient continues to have difficulty with hip flexion strength and SL stance during gait.  Patient is progressing towards goals and will continue to benefit from skilled therapy to improve strength, balance, and return to prior level of function.    Personal Factors and Comorbidities Past/Current Experience    Examination-Activity Limitations Locomotion Level    Examination-Participation Restrictions Community Activity    Stability/Clinical Decision Making Stable/Uncomplicated    Clinical Decision Making Low    Rehab Potential Good    Clinical Impairments Affecting Rehab Potential (-) hx of MS; (+) good motivation    PT Frequency 2x / week    PT Duration 4 weeks    PT Treatment/Interventions Moist Heat;Iontophoresis 4mg /ml Dexamethasone;Electrical Stimulation;Aquatic  Therapy;Cryotherapy;Therapeutic activities;Neuromuscular re-education;Therapeutic exercise;Patient/family education;Dry needling;Manual techniques;Passive range of motion;Balance training;Functional mobility training;Gait training;Stair training;Joint Manipulations    PT Next Visit Plan Dynamic balance and LE strength exercise    PT Home Exercise Plan hip flexion in sitting for strengthening,  hip flexion knee flexion in standing for balance, bike intervals at 70%-75% max HR    Consulted and Agree with Plan of Care Patient           Patient will benefit from skilled therapeutic intervention in order to improve the following deficits and impairments:  Pain, Decreased coordination, Impaired perceived functional ability, Increased fascial restricitons, Increased muscle spasms, Difficulty walking, Abnormal gait, Decreased balance, Decreased mobility, Impaired sensation  Visit Diagnosis: Difficulty in walking, not elsewhere classified  Muscle weakness (generalized)  History of falling     Problem List There are no problems to display for this patient.  7:13 PM, 10/08/19 Margarito Liner, SPT Student Physical Therapist [place]  [phone]  Annina Piotrowski 10/08/2019, 7:12 PM  [place] [facility] [address], [place], [address] Phone: [phone]   Fax:  [phone]  Name: SHELIE LANSING MRN: [id number] Date of Birth: September 17, 1970

## 2019-10-13 ENCOUNTER — Ambulatory Visit: Payer: Self-pay | Admitting: Obstetrics and Gynecology

## 2019-10-22 ENCOUNTER — Ambulatory Visit: Payer: BC Managed Care – PPO

## 2019-10-22 ENCOUNTER — Other Ambulatory Visit: Payer: Self-pay

## 2019-10-22 DIAGNOSIS — M6281 Muscle weakness (generalized): Secondary | ICD-10-CM

## 2019-10-22 DIAGNOSIS — R262 Difficulty in walking, not elsewhere classified: Secondary | ICD-10-CM

## 2019-10-22 DIAGNOSIS — Z9181 History of falling: Secondary | ICD-10-CM

## 2019-10-22 NOTE — Therapy (Signed)
[place] [facility] [address], [place], [address] Phone: [phone]   Fax:  [phone]  Physical Therapy Treatment  Patient Details  Name: Michelle Ruiz MRN: [id number] Date of Birth: 05/23/1970 No data recorded  Encounter Date: 10/22/2019   PT End of Session - 10/22/19 1733    Visit Number 98    Number of Visits 103    Date for PT Re-Evaluation 11/11/19    Authorization Type 7/10    PT Start Time 1730    PT Stop Time 1815    PT Time Calculation (min) 45 min    Equipment Utilized During Treatment Gait belt    Activity Tolerance Patient tolerated treatment well;No increased pain;Other (comment)    Behavior During Therapy [facility] for tasks assessed/performed;Anxious           Past Medical History:  Diagnosis Date   Abnormal Pap smear of cervix    Anxiety    Kidney stones    MS (multiple sclerosis) ([place])    UTI (urinary tract infection)     Past Surgical History:  Procedure Laterality Date   BREAST BIOPSY Right 2010   stereotactic biopsy/ neg/ dr.byrnett   BREAST BIOPSY Left 2012   neg/dr. brynett   CESAREAN SECTION  2008   [place]   TONSILLECTOMY  2006    There were no vitals filed for this visit.   Subjective Assessment - 10/22/19 1731    Subjective Patient reports no issues at this session.    Patient is accompained by: Family member    Pertinent History Patient reports history of falls x3 in the past 6 months secondary to drop foot on the L LE. PMH: Multiple sclerosis; elbow fracture of the R UE    Limitations Standing;Walking    How long can you stand comfortably? 20 min    How long can you walk comfortably? 1 miles    Patient Stated Goals To be more balanced and core strengthening     Currently in Pain? No/denies    Pain Onset More than a month ago            INTERVENTIONS    Therapeutic Exercises  TRX Squat 2x15 Ambulation with focus on increasing step height (hip flexion  with 2lb ankle weights - x 176ft) TRX Reverse Lunges - 2x10 bilat DL Stance on AirEx pad in tandem x10 ball toss Lateral Step Ups 8in step x12 bilat 2lb AW Hurdles (6 hurdles ~16ft apart) x5 down/back  2lb AW Seated LAQ 7.5lb x10 L Leg  Seated Hip Flexion 7.5lbs 2x10     Performed exercise to improve strength and balance     PT Education - 10/22/19 1732    Education Details form/technique with exercise    Person(s) Educated Patient    Methods Explanation;Demonstration    Comprehension Verbalized understanding;Returned demonstration               PT Long Term Goals - 09/17/19 1559      PT LONG TERM GOAL #1   Title Patient will be independent with balance HEP to continue benefits of therapy after discharge.    Baseline dependent with form/technique for balance exercise; 03/25/2018: Independent     Time 6    Period Weeks    Status Achieved      PT LONG TERM GOAL #2   Title Patient will improve FGA by 4 points to indicate significant improvement with balancing while ambulating and decrease of fall  risk    Baseline 03/25/2018: 28/30; 04/10/2019: 22; 07/21/2019: 29; 09/17/2019: 30/30    Time 6    Period Weeks    Status Achieved      PT LONG TERM GOAL #3   Title Patient will be able to perform prolonged single leg stance >10sec with eyes closed to indicate improvement with static balance and decrease fall risk    Baseline 2 sec in standing EC; 03/25/2018: 3 sec; 04/29/2018: 4 sec; 08/21/2018: 5 sec; 09/19/2018: 6 sec ; 10/21/2018 3 sec; 11/21/2018 5 sec; 01/08/2019: 9 sec; 02/25/2019: 14sec    Time 4    Period Weeks    Status Achieved      PT LONG TERM GOAL #4   Title Pt will improve 6 MWT >2085ft to demonstrate improved cardiorespiratory fitness    Baseline Deferred to next session: 04/29/2018; 05/01/2018: 146ft; 08/21/2018: 1469ft; 09/19/2018: 1448ft; 10/21/2018 1621; 11/21/2018 deferred to next session 2/2 time.; 01/08/2019: 1480 ft; 02/25/2019: 1334ft; 04/10/2019: 1400 ft, 05/13/19: 1321  ft; 07/21/2019: 1170 ft; 09/17/2019: 1225ft    Time 4    Period Weeks    Status On-going      PT LONG TERM GOAL #5   Title Patient will be able to run 271ft without stopping to better be able to return to recreational activites and address cardiovascular endurance.    Baseline 37ft; 02/25/2019: 4ft; 04/10/2019 deferred    Time 4    Period Weeks    Status Deferred      PT LONG TERM GOAL #6   Title Patient will improve L hip flexion strength to 4/5 for improved steppage gait and reduced fall risk.    Baseline 04/10/2019: L hip flexion 3+/5; 07/21/2019: L Hip 4/5    Time 6    Period Weeks    Status Achieved      PT LONG TERM GOAL #7   Title Patient will be able to lift her affected L LE to the same extent as the R LE to allow for greater amount of foot clearance when ambulating    Baseline L LE: 10", R LE: 15"    Time 8    Period Weeks    Status New                 Plan - 10/22/19 1733    Clinical Impression Statement Patient improved hip flexion after cueing to perform with increased speed and power indicating improvement in motor control. Patient continues to have increased difficulty with performing static and dynamic balance exercises. Patient will benefit from further skilled therapy to return to prior level of function.    Personal Factors and Comorbidities Past/Current Experience    Examination-Activity Limitations Locomotion Level    Examination-Participation Restrictions Community Activity    Stability/Clinical Decision Making Stable/Uncomplicated    Clinical Decision Making Low    Rehab Potential Good    Clinical Impairments Affecting Rehab Potential (-) hx of MS; (+) good motivation    PT Frequency 2x / week    PT Duration 4 weeks    PT Treatment/Interventions Moist Heat;Iontophoresis 4mg /ml Dexamethasone;Electrical Stimulation;Aquatic Therapy;Cryotherapy;Therapeutic activities;Neuromuscular re-education;Therapeutic exercise;Patient/family education;Dry  needling;Manual techniques;Passive range of motion;Balance training;Functional mobility training;Gait training;Stair training;Joint Manipulations;Prosthetic Training    PT Next Visit Plan Dynamic balance and LE strength exercise    PT Home Exercise Plan hip flexion in sitting for strengthening, hip flexion knee flexion in standing for balance, bike intervals at 70%-75% max HR    Consulted and Agree with Plan of Care Patient  Patient will benefit from skilled therapeutic intervention in order to improve the following deficits and impairments:  Pain, Decreased coordination, Impaired perceived functional ability, Increased fascial restricitons, Increased muscle spasms, Difficulty walking, Abnormal gait, Decreased balance, Decreased mobility, Impaired sensation  Visit Diagnosis: Difficulty in walking, not elsewhere classified  Muscle weakness (generalized)  History of falling     Problem List There are no problems to display for this patient.  6:20 PM, 10/22/19 Margarito Liner, SPT Student Physical Therapist [place]  [phone]  Shatia Sindoni 10/22/2019, 6:19 PM  [facility] [facility] [address], [place], [address] Phone: [phone]   Fax:  [phone]  Name: Michelle Ruiz MRN: [id number] Date of Birth: 12/28/1970

## 2019-11-03 ENCOUNTER — Encounter: Payer: BC Managed Care – PPO | Admitting: Dermatology

## 2019-11-05 ENCOUNTER — Other Ambulatory Visit: Payer: Self-pay

## 2019-11-05 ENCOUNTER — Ambulatory Visit: Payer: BC Managed Care – PPO | Attending: Neurology

## 2019-11-05 DIAGNOSIS — R262 Difficulty in walking, not elsewhere classified: Secondary | ICD-10-CM

## 2019-11-05 DIAGNOSIS — M25621 Stiffness of right elbow, not elsewhere classified: Secondary | ICD-10-CM | POA: Diagnosis present

## 2019-11-05 DIAGNOSIS — M25521 Pain in right elbow: Secondary | ICD-10-CM | POA: Insufficient documentation

## 2019-11-05 DIAGNOSIS — M6281 Muscle weakness (generalized): Secondary | ICD-10-CM

## 2019-11-05 DIAGNOSIS — Z9181 History of falling: Secondary | ICD-10-CM | POA: Diagnosis present

## 2019-11-05 NOTE — Therapy (Signed)
[facility] [address] [place], [address] Phone: [phone]   Fax:  [phone]  Physical Therapy Treatment  Patient Details  Name: Michelle Ruiz MRN: [id number] Date of Birth: 1971-03-26 No data recorded  Encounter Date: 11/05/2019   PT End of Session - 11/05/19 1703    Visit Number 99    Number of Visits 103    Date for PT Re-Evaluation 11/11/19    Authorization Type 8/10    PT Start Time 1700    PT Stop Time 1745    PT Time Calculation (min) 45 min    Equipment Utilized During Treatment Gait belt    Activity Tolerance Patient tolerated treatment well;No increased pain;Other (comment)    Behavior During Therapy [facility] for tasks assessed/performed;Anxious           Past Medical History:  Diagnosis Date  . Abnormal Pap smear of cervix   . Anxiety   . Kidney stones   . MS (multiple sclerosis) ([place])   . UTI (urinary tract infection)     Past Surgical History:  Procedure Laterality Date  . BREAST BIOPSY Right 2010   stereotactic biopsy/ neg/ dr.byrnett  . BREAST BIOPSY Left 2012   neg/dr. brynett  . CESAREAN SECTION  2008   RPH  . TONSILLECTOMY  2006    There were no vitals filed for this visit.   Subjective Assessment - 11/05/19 1702    Subjective Patient reports that everythings been going good.    Patient is accompained by: Family member    Pertinent History Patient reports history of falls x3 in the past 6 months secondary to drop foot on the L LE. PMH: Multiple sclerosis; elbow fracture of the R UE    Limitations Standing;Walking;Lifting    How long can you stand comfortably? 20 min    How long can you walk comfortably? 1 miles    Patient Stated Goals To be more balanced and core strengthening     Currently in Pain? No/denies    Pain Onset More than a month ago          INTERVENTIONS    Therapeutic Exercises  Total Gym Squats Level 24 2x12 Total Gym SL Squats Level 17 1x8  bilat. Step-ups with 3lb AW on 10in Step 2x8 bilat  Ambulation with focus on increasing step height and width spacing 2 hurdles 18inch apart (hip flexion with 3lb ankle weights x3 down and back  Hip Flexion Machine (R side 40lbs and L side 25lbs) 2x8 bilat.  Performed exercise to improve strength and balance     PT Education - 11/05/19 1703    Education Details form/technique with exercise    Person(s) Educated Patient    Methods Explanation;Demonstration    Comprehension Verbalized understanding;Returned demonstration               PT Long Term Goals - 09/17/19 1559      PT LONG TERM GOAL #1   Title Patient will be independent with balance HEP to continue benefits of therapy after discharge.    Baseline dependent with form/technique for balance exercise; 03/25/2018: Independent     Time 6    Period Weeks    Status Achieved      PT LONG TERM GOAL #2   Title Patient will improve FGA by 4 points to indicate significant improvement with balancing while ambulating and decrease of fall risk    Baseline 03/25/2018: 28/30; 04/10/2019: 22; 07/21/2019:  29; 09/17/2019: 30/30    Time 6    Period Weeks    Status Achieved      PT LONG TERM GOAL #3   Title Patient will be able to perform prolonged single leg stance >10sec with eyes closed to indicate improvement with static balance and decrease fall risk    Baseline 2 sec in standing EC; 03/25/2018: 3 sec; 04/29/2018: 4 sec; 08/21/2018: 5 sec; 09/19/2018: 6 sec ; 10/21/2018 3 sec; 11/21/2018 5 sec; 01/08/2019: 9 sec; 02/25/2019: 14sec    Time 4    Period Weeks    Status Achieved      PT LONG TERM GOAL #4   Title Pt will improve 6 MWT >2064ft to demonstrate improved cardiorespiratory fitness    Baseline Deferred to next session: 04/29/2018; 05/01/2018: 1450ft; 08/21/2018: 1420ft; 09/19/2018: 1458ft; 10/21/2018 1621; 11/21/2018 deferred to next session 2/2 time.; 01/08/2019: 1480 ft; 02/25/2019: 1343ft; 04/10/2019: 1400 ft, 05/13/19: 1321 ft; 07/21/2019:  1170 ft; 09/17/2019: 1275ft    Time 4    Period Weeks    Status On-going      PT LONG TERM GOAL #5   Title Patient will be able to run 263ft without stopping to better be able to return to recreational activites and address cardiovascular endurance.    Baseline 69ft; 02/25/2019: 6ft; 04/10/2019 deferred    Time 4    Period Weeks    Status Deferred      PT LONG TERM GOAL #6   Title Patient will improve L hip flexion strength to 4/5 for improved steppage gait and reduced fall risk.    Baseline 04/10/2019: L hip flexion 3+/5; 07/21/2019: L Hip 4/5    Time 6    Period Weeks    Status Achieved      PT LONG TERM GOAL #7   Title Patient will be able to lift her affected L LE to the same extent as the R LE to allow for greater amount of foot clearance when ambulating    Baseline L LE: 10", R LE: 15"    Time 8    Period Weeks    Status New                 Plan - 11/05/19 1704    Clinical Impression Statement Focused on LE strength, endurance, and gait during today's session to continue progressing towards goals. Patient tolerated today's exercises well with moderate muscular fatigue after today's session.  Patient L LE had more muscular fatigue when compared to her R LE and continues to be weaker also when compared to R LE. Patient will continue to benefit from further skilled therapy to return to prior level of function and progress towards goals.    Personal Factors and Comorbidities Past/Current Experience    Examination-Activity Limitations Locomotion Level    Examination-Participation Restrictions Community Activity    Stability/Clinical Decision Making Stable/Uncomplicated    Clinical Decision Making Low    Rehab Potential Good    Clinical Impairments Affecting Rehab Potential (-) hx of MS; (+) good motivation    PT Frequency 2x / week    PT Duration 4 weeks    PT Treatment/Interventions Moist Heat;Iontophoresis 4mg /ml Dexamethasone;Electrical Stimulation;Aquatic  Therapy;Cryotherapy;Therapeutic activities;Neuromuscular re-education;Therapeutic exercise;Patient/family education;Dry needling;Manual techniques;Passive range of motion;Balance training;Functional mobility training;Gait training;Stair training;Joint Manipulations;Prosthetic Training    PT Next Visit Plan Dynamic balance and LE strength exercise    PT Home Exercise Plan hip flexion in sitting for strengthening, hip flexion knee flexion in standing for balance, bike  intervals at 70%-75% max HR    Consulted and Agree with Plan of Care Patient           Patient will benefit from skilled therapeutic intervention in order to improve the following deficits and impairments:  Pain, Decreased coordination, Impaired perceived functional ability, Increased fascial restricitons, Increased muscle spasms, Difficulty walking, Abnormal gait, Decreased balance, Decreased mobility, Impaired sensation  Visit Diagnosis: Difficulty in walking, not elsewhere classified  Muscle weakness (generalized)  History of falling     Problem List There are no problems to display for this patient.  5:50 PM, 11/05/19 Margarito Liner, SPT Student Physical Therapist [place]  [phone]  Michelle Ruiz 11/05/2019, 5:44 PM  [facility] [address] [place], [address] Phone: [phone]   Fax:  [phone]  Name: Michelle Ruiz MRN: [id number] Date of Birth: [birth year]

## 2019-11-10 ENCOUNTER — Other Ambulatory Visit: Payer: Self-pay

## 2019-11-10 ENCOUNTER — Ambulatory Visit: Payer: BC Managed Care – PPO

## 2019-11-10 DIAGNOSIS — R262 Difficulty in walking, not elsewhere classified: Secondary | ICD-10-CM | POA: Diagnosis not present

## 2019-11-10 DIAGNOSIS — M6281 Muscle weakness (generalized): Secondary | ICD-10-CM

## 2019-11-10 DIAGNOSIS — M25521 Pain in right elbow: Secondary | ICD-10-CM

## 2019-11-10 DIAGNOSIS — M25621 Stiffness of right elbow, not elsewhere classified: Secondary | ICD-10-CM

## 2019-11-10 DIAGNOSIS — Z9181 History of falling: Secondary | ICD-10-CM

## 2019-11-10 NOTE — Therapy (Signed)
[facility] [address] [place], [address] Phone: [phone]   Fax:  [phone]  Physical Therapy Progress Note   Dates of reporting period  08/04/19   to   11/10/19  Physical Therapy Treatment  Patient Details  Name: Michelle Ruiz MRN: [id number] Date of Birth: [birth year] No data recorded  Encounter Date: 11/10/2019   PT End of Session - 11/10/19 1737    Visit Number 100    Number of Visits 103    Date for PT Re-Evaluation 11/11/19    PT Start Time 1529    PT Stop Time 1609    PT Time Calculation (min) 40 min    Equipment Utilized During Treatment Gait belt    Activity Tolerance Patient tolerated treatment well;No increased pain;Other (comment)    Behavior During Therapy [facility] for tasks assessed/performed;Anxious           Past Medical History:  Diagnosis Date  . Abnormal Pap smear of cervix   . Anxiety   . Kidney stones   . MS (multiple sclerosis) ([place])   . UTI (urinary tract infection)     Past Surgical History:  Procedure Laterality Date  . BREAST BIOPSY Right 2010   stereotactic biopsy/ neg/ dr.byrnett  . BREAST BIOPSY Left 2012   neg/dr. brynett  . CESAREAN SECTION  2008   RPH  . TONSILLECTOMY  2006    There were no vitals filed for this visit.   Subjective Assessment - 11/10/19 1733    Subjective Pt doing well in general. Went to [facility] to celebrate her [facility].    Pertinent History Patient reports history of falls x3 in the past 6 months secondary to drop foot on the L LE. PMH: Multiple sclerosis; elbow fracture of the R UE    Currently in Pain? No/denies           INTERVENTION THIS DATE:  -Total Gym Squats Level 24 2x12 -Total Gym SL Squats Level 17 1x8 bilat. -tandem stance on half-roller (flat down) 3x30 bilat -normal stance on half-roller (flat up) 3x30sec  -narrow stance foam vertical head turns x15 (in // bars, minGuard assist) -narrow stance foam, head/trunk  rotation with posterior gaze 1x8 bilat, (in // bars, minGuard assist) -alternate toe taps c ankle weights 1x15 bilat, 1.5lb on Rt, 1lb on Left      PT Long Term Goals - 09/17/19 1559      PT LONG TERM GOAL #1   Title Patient will be independent with balance HEP to continue benefits of therapy after discharge.    Baseline dependent with form/technique for balance exercise; 03/25/2018: Independent     Time 6    Period Weeks    Status Achieved      PT LONG TERM GOAL #2   Title Patient will improve FGA by 4 points to indicate significant improvement with balancing while ambulating and decrease of fall risk    Baseline 03/25/2018: 28/30; 04/10/2019: 22; 07/21/2019: 29; 09/17/2019: 30/30    Time 6    Period Weeks    Status Achieved      PT LONG TERM GOAL #3   Title Patient will be able to perform prolonged single leg stance >10sec with eyes closed to indicate improvement with static balance and decrease fall risk    Baseline 2 sec in standing EC; 03/25/2018: 3 sec; 04/29/2018: 4 sec; 08/21/2018: 5 sec; 09/19/2018: 6 sec ; 10/21/2018 3 sec; 11/21/2018 5 sec; 01/08/2019: 9 sec;  02/25/2019: 14sec    Time 4    Period Weeks    Status Achieved      PT LONG TERM GOAL #4   Title Pt will improve 6 MWT >2034ft to demonstrate improved cardiorespiratory fitness    Baseline Deferred to next session: 04/29/2018; 05/01/2018: 1459ft; 08/21/2018: 1467ft; 09/19/2018: 1451ft; 10/21/2018 1621; 11/21/2018 deferred to next session 2/2 time.; 01/08/2019: 1480 ft; 02/25/2019: 1325ft; 04/10/2019: 1400 ft, 05/13/19: 1321 ft; 07/21/2019: 1170 ft; 09/17/2019: 1271ft    Time 4    Period Weeks    Status On-going      PT LONG TERM GOAL #5   Title Patient will be able to run 270ft without stopping to better be able to return to recreational activites and address cardiovascular endurance.    Baseline 59ft; 02/25/2019: 8ft; 04/10/2019 deferred    Time 4    Period Weeks    Status Deferred      PT LONG TERM GOAL #6   Title Patient  will improve L hip flexion strength to 4/5 for improved steppage gait and reduced fall risk.    Baseline 04/10/2019: L hip flexion 3+/5; 07/21/2019: L Hip 4/5    Time 6    Period Weeks    Status Achieved      PT LONG TERM GOAL #7   Title Patient will be able to lift her affected L LE to the same extent as the R LE to allow for greater amount of foot clearance when ambulating    Baseline L LE: 10", R LE: 15"    Time 8    Period Weeks    Status New                 Plan - 11/10/19 1738    Clinical Impression Statement Continued with current plan of care, gently progressing patient's program aimed at address deficits and limitations identified in evlauation. Incorporated more proprioceptive training and static balance/postural reintegration. Pt continues to make steady progress toward treatment goals in general. Author provides extensive verbal, visual, and tactile cues when needed to assure all interventions are performed with desired form and good accuracy. Extensive communicaiton to assure pt is able to perform all activities without exacerbation of pain or other symptoms. Pt does well to communicate her rest interval requirements which are often    Personal Factors and Comorbidities Past/Current Experience    Examination-Activity Limitations Locomotion Level    Examination-Participation Restrictions Community Activity    Stability/Clinical Decision Making Stable/Uncomplicated    Clinical Decision Making Low    Rehab Potential Good    Clinical Impairments Affecting Rehab Potential (-) hx of MS; (+) good motivation    PT Frequency 2x / week    PT Duration 4 weeks    PT Treatment/Interventions Moist Heat;Iontophoresis 4mg /ml Dexamethasone;Electrical Stimulation;Aquatic Therapy;Cryotherapy;Therapeutic activities;Neuromuscular re-education;Therapeutic exercise;Patient/family education;Dry needling;Manual techniques;Passive range of motion;Balance training;Functional mobility training;Gait  training;Stair training;Joint Manipulations;Prosthetic Training    PT Next Visit Plan Dynamic balance and LE strength exercise    PT Home Exercise Plan hip flexion in sitting for strengthening, hip flexion knee flexion in standing for balance, bike intervals at 70%-75% max HR    Consulted and Agree with Plan of Care Patient           Patient will benefit from skilled therapeutic intervention in order to improve the following deficits and impairments:  Pain, Decreased coordination, Impaired perceived functional ability, Increased fascial restricitons, Increased muscle spasms, Difficulty walking, Abnormal gait, Decreased balance, Decreased mobility, Impaired sensation  Visit  Diagnosis: Difficulty in walking, not elsewhere classified  Muscle weakness (generalized)  History of falling  Pain in right elbow  Stiffness of right elbow, not elsewhere classified     Problem List There are no problems to display for this patient.  6:01 PM, 11/10/19 Etta Grandchild, PT, DPT Physical Therapist - [place] [phone] (Office)  Katharine Rochefort C 11/10/2019, 5:54 PM  [facility] [address] [place], [address] Phone: [phone]   Fax:  [phone]  Name: MICHALINA CALBERT MRN: [id number] Date of Birth: 27-Jun-1970

## 2019-11-11 ENCOUNTER — Encounter: Payer: Self-pay | Admitting: Obstetrics and Gynecology

## 2019-11-11 ENCOUNTER — Ambulatory Visit (INDEPENDENT_AMBULATORY_CARE_PROVIDER_SITE_OTHER): Payer: BC Managed Care – PPO | Admitting: Obstetrics and Gynecology

## 2019-11-11 VITALS — BP 120/70 | Ht 69.0 in | Wt 186.0 lb

## 2019-11-11 DIAGNOSIS — Z1211 Encounter for screening for malignant neoplasm of colon: Secondary | ICD-10-CM

## 2019-11-11 DIAGNOSIS — Z01419 Encounter for gynecological examination (general) (routine) without abnormal findings: Secondary | ICD-10-CM

## 2019-11-11 DIAGNOSIS — Z1231 Encounter for screening mammogram for malignant neoplasm of breast: Secondary | ICD-10-CM

## 2019-11-11 DIAGNOSIS — Z13220 Encounter for screening for lipoid disorders: Secondary | ICD-10-CM

## 2019-11-11 DIAGNOSIS — Z13820 Encounter for screening for osteoporosis: Secondary | ICD-10-CM

## 2019-11-11 DIAGNOSIS — Z0000 Encounter for general adult medical examination without abnormal findings: Secondary | ICD-10-CM

## 2019-11-11 DIAGNOSIS — N920 Excessive and frequent menstruation with regular cycle: Secondary | ICD-10-CM

## 2019-11-11 DIAGNOSIS — Z1329 Encounter for screening for other suspected endocrine disorder: Secondary | ICD-10-CM

## 2019-11-11 DIAGNOSIS — Z124 Encounter for screening for malignant neoplasm of cervix: Secondary | ICD-10-CM

## 2019-11-11 DIAGNOSIS — Z131 Encounter for screening for diabetes mellitus: Secondary | ICD-10-CM

## 2019-11-11 NOTE — Patient Instructions (Addendum)
Institute of Medicine Recommended Dietary Allowances for Calcium and Vitamin D  Age (yr) Calcium Recommended Dietary Allowance (mg/day) Vitamin D Recommended Dietary Allowance (international units/day)  9-18 1,300 600  19-50 1,000 600  51-70 1,200 600  71 and older 1,200 800  Data from Institute of Medicine. Dietary reference intakes: calcium, vitamin D. [place], [place]: National Academies Press; 2011.     Exercising to Stay Healthy To become healthy and stay healthy, it is recommended that you do moderate-intensity and vigorous-intensity exercise. You can tell that you are exercising at a moderate intensity if your heart starts beating faster and you start breathing faster but can still hold a conversation. You can tell that you are exercising at a vigorous intensity if you are breathing much harder and faster and cannot hold a conversation while exercising. Exercising regularly is important. It has many health benefits, such as:  Improving overall fitness, flexibility, and endurance.  Increasing bone density.  Helping with weight control.  Decreasing body fat.  Increasing muscle strength.  Reducing stress and tension.  Improving overall health. How often should I exercise? Choose an activity that you enjoy, and set realistic goals. Your health care provider can help you make an activity plan that works for you. Exercise regularly as told by your health care provider. This may include:  Doing strength training two times a week, such as: ? Lifting weights. ? Using resistance bands. ? Push-ups. ? Sit-ups. ? Yoga.  Doing a certain intensity of exercise for a given amount of time. Choose from these options: ? A total of 150 minutes of moderate-intensity exercise every week. ? A total of 75 minutes of vigorous-intensity exercise every week. ? A mix of moderate-intensity and vigorous-intensity exercise every week. Children, pregnant women, people who have not exercised  regularly, people who are overweight, and older adults may need to talk with a health care provider about what activities are safe to do. If you have a medical condition, be sure to talk with your health care provider before you start a new exercise program. What are some exercise ideas? Moderate-intensity exercise ideas include:  Walking 1 mile (1.6 km) in about 15 minutes.  Biking.  Hiking.  Golfing.  Dancing.  Water aerobics. Vigorous-intensity exercise ideas include:  Walking 4.5 miles (7.2 km) or more in about 1 hour.  Jogging or running 5 miles (8 km) in about 1 hour.  Biking 10 miles (16.1 km) or more in about 1 hour.  Lap swimming.  Roller-skating or in-line skating.  Cross-country skiing.  Vigorous competitive sports, such as football, basketball, and soccer.  Jumping rope.  Aerobic dancing. What are some everyday activities that can help me to get exercise?  Yard work, such as: ? Pushing a lawn mower. ? Raking and bagging leaves.  Washing your car.  Pushing a stroller.  Shoveling snow.  Gardening.  Washing windows or floors. How can I be more active in my day-to-day activities?  Use stairs instead of an elevator.  Take a walk during your lunch break.  If you drive, park your car farther away from your work or school.  If you take public transportation, get off one stop early and walk the rest of the way.  Stand up or walk around during all of your indoor phone calls.  Get up, stretch, and walk around every 30 minutes throughout the day.  Enjoy exercise with a friend. Support to continue exercising will help you keep a regular routine of activity. What guidelines can   I follow while exercising?  Before you start a new exercise program, talk with your health care provider.  Do not exercise so much that you hurt yourself, feel dizzy, or get very short of breath.  Wear comfortable clothes and wear shoes with good support.  Drink plenty of  water while you exercise to prevent dehydration or heat stroke.  Work out until your breathing and your heartbeat get faster. Where to find more information  [place] Department of Health and Human Services: www.hhs.gov  Centers for Disease Control and Prevention (CDC): www.cdc.gov Summary  Exercising regularly is important. It will improve your overall fitness, flexibility, and endurance.  Regular exercise also will improve your overall health. It can help you control your weight, reduce stress, and improve your bone density.  Do not exercise so much that you hurt yourself, feel dizzy, or get very short of breath.  Before you start a new exercise program, talk with your health care provider. This information is not intended to replace advice given to you by your health care provider. Make sure you discuss any questions you have with your health care provider. Document Revised: 02/23/2017 Document Reviewed: 02/01/2017 Elsevier Patient Education  2020 Else[employer] Inc.   Budget-Friendly Healthy Eating There are many ways to save money at the grocery store and continue to eat healthy. You can be successful if you:  Plan meals according to your budget.  Make a grocery list and only purchase food according to your grocery list.  Prepare food yourself. What are tips for following this plan?  Reading food labels  Compare food labels between brand name foods and the store brand. Often the nutritional value is the same, but the store brand is lower cost.  Look for products that do not have added sugar, fat, or salt (sodium). These often cost the same but are healthier for you. Products may be labeled as: ? Sugar-free. ? Nonfat. ? Low-fat. ? Sodium-free. ? Low-sodium.  Look for lean ground beef labeled as at least 92% lean and 8% fat. Shopping  Buy only the items on your grocery list and go only to the areas of the store that have the items on your list.  Use coupons only for foods  and brands you normally buy. Avoid buying items you wouldn't normally buy simply because they are on sale.  Check online and in newspapers for weekly deals.  Buy healthy items from the bulk bins when available, such as herbs, spices, flour, pasta, nuts, and dried fruit.  Buy fruits and vegetables that are in season. Prices are usually lower on in-season produce.  Look at the unit price on the price tag. Use it to compare different brands and sizes to find out which item is the best deal.  Choose healthy items that are often low-cost, such as carrots, potatoes, apples, bananas, and oranges. Dried or canned beans are a low-cost protein source.  Buy in bulk and freeze extra food. Items you can buy in bulk include meats, fish, poultry, frozen fruits, and frozen vegetables.  Avoid buying "ready-to-eat" foods, such as pre-cut fruits and vegetables and pre-made salads.  If possible, shop around to discover where you can find the best prices. Consider other retailers such as dollar stores, larger wholesale stores, local fruit and vegetable stands, and farmers markets.  Do not shop when you are hungry. If you shop while hungry, it may be hard to stick to your list and budget.  Resist impulse buying. Use your grocery list as   your official plan for the week.  Buy a variety of vegetables and fruits by purchasing fresh, frozen, and canned items.  Look at the top and bottom shelves for deals. Foods at eye level (eye level of an adult or child) are usually more expensive.  Be efficient with your time when shopping. The more time you spend at the store, the more money you are likely to spend.  To save money when choosing more expensive foods like meats and dairy: ? Choose cheaper cuts of meat, such as bone-in chicken thighs and drumsticks instead of skinless and boneless chicken. When you are ready to prepare the chicken, you can remove the skin yourself to make it healthier. ? Choose lean meats like  chicken or turkey instead of beef. ? Choose canned seafood, such as tuna, salmon, or sardines. ? Buy eggs as a low-cost source of protein. ? Buy dried beans and peas, such as lentils, split peas, or kidney beans instead of meats. Dried beans and peas are a good alternative source of protein. ? Buy the larger tubs of yogurt instead of individual-sized containers.  Choose water instead of sodas and other sweetened beverages.  Avoid buying chips, cookies, and other "junk food." These items are usually expensive and not healthy. Cooking  Make extra food and freeze the extras in meal-sized containers or in individual portions for fast meals and snacks.  Pre-cook on days when you have extra time to prepare meals in advance. You can keep these meals in the fridge or freezer and reheat for a quick meal.  When you come home from the grocery store, wash, peel, and cut fruits and vegetables so they are ready to use and eat. This will help reduce food waste. Meal planning  Do not eat out or get fast food. Prepare food at home.  Make a grocery list and make sure to bring it with you to the store. If you have a smart phone, you could use your phone to create your shopping list.  Plan meals and snacks according to a grocery list and budget you create.  Use leftovers in your meal plan for the week.  Look for recipes where you can cook once and make enough food for two meals.  Include budget-friendly meals like stews, casseroles, and stir-fry dishes.  Try some meatless meals or try "no cook" meals like salads.  Make sure that half your plate is filled with fruits or vegetables. Choose from fresh, frozen, or canned fruits and vegetables. If eating canned, remember to rinse them before eating. This will remove any excess salt added for packaging. Summary  Eating healthy on a budget is possible if you plan your meals according to your budget, purchase according to your budget and grocery list, and  prepare food yourself.  Tips for buying more food on a limited budget include buying generic brands, using coupons only for foods you normally buy, and buying healthy items from the bulk bins when available.  Tips for buying cheaper food to replace expensive food include choosing cheaper, lean cuts of meat, and buying dried beans and peas. This information is not intended to replace advice given to you by your health care provider. Make sure you discuss any questions you have with your health care provider. Document Revised: 03/14/2017 Document Reviewed: 03/14/2017 Elsevier Patient Education  2020 Elsevier Inc.   Bone Health Bones protect organs, store calcium, anchor muscles, and support the whole body. Keeping your bones strong is important, especially as you   get older. You can take actions to help keep your bones strong and healthy. Why is keeping my bones healthy important?  Keeping your bones healthy is important because your body constantly replaces bone cells. Cells get old, and new cells take their place. As we age, we lose bone cells because the body may not be able to make enough new cells to replace the old cells. The amount of bone cells and bone tissue you have is referred to as bone mass. The higher your bone mass, the stronger your bones. The aging process leads to an overall loss of bone mass in the body, which can increase the likelihood of:  Joint pain and stiffness.  Broken bones.  A condition in which the bones become weak and brittle (osteoporosis). A large decline in bone mass occurs in older adults. In women, it occurs about the time of menopause. What actions can I take to keep my bones healthy? Good health habits are important for maintaining healthy bones. This includes eating nutritious foods and exercising regularly. To have healthy bones, you need to get enough of the right minerals and vitamins. Most nutrition experts recommend getting these nutrients from the  foods that you eat. In some cases, taking supplements may also be recommended. Doing certain types of exercise is also important for bone health. What are the nutritional recommendations for healthy bones?  Eating a well-balanced diet with plenty of calcium and vitamin D will help to protect your bones. Nutritional recommendations vary from person to person. Ask your health care provider what is healthy for you. Here are some general guidelines. Get enough calcium Calcium is the most important (essential) mineral for bone health. Most people can get enough calcium from their diet, but supplements may be recommended for people who are at risk for osteoporosis. Good sources of calcium include:  Dairy products, such as low-fat or nonfat milk, cheese, and yogurt.  Dark green leafy vegetables, such as bok choy and broccoli.  Calcium-fortified foods, such as orange juice, cereal, bread, soy beverages, and tofu products.  Nuts, such as almonds. Follow these recommended amounts for daily calcium intake:  Children, age 1-3: 700 mg.  Children, age 4-8: 1,000 mg.  Children, age 9-13: 1,300 mg.  Teens, age 14-18: 1,300 mg.  Adults, age 19-50: 1,000 mg.  Adults, age 51-70: ? Men: 1,000 mg. ? Women: 1,200 mg.  Adults, age 71 or older: 1,200 mg.  Pregnant and breastfeeding females: ? Teens: 1,300 mg. ? Adults: 1,000 mg. Get enough vitamin D Vitamin D is the most essential vitamin for bone health. It helps the body absorb calcium. Sunlight stimulates the skin to make vitamin D, so be sure to get enough sunlight. If you live in a cold climate or you do not get outside often, your health care provider may recommend that you take vitamin D supplements. Good sources of vitamin D in your diet include:  Egg yolks.  Saltwater fish.  Milk and cereal fortified with vitamin D. Follow these recommended amounts for daily vitamin D intake:  Children and teens, age 1-18: 600 international  units.  Adults, age 50 or younger: 400-800 international units.  Adults, age 51 or older: 800-1,000 international units. Get other important nutrients Other nutrients that are important for bone health include:  Phosphorus. This mineral is found in meat, poultry, dairy foods, nuts, and legumes. The recommended daily intake for adult men and adult women is 700 mg.  Magnesium. This mineral is found in seeds, nuts, dark   green vegetables, and legumes. The recommended daily intake for adult men is 400-420 mg. For adult women, it is 310-320 mg.  Vitamin K. This vitamin is found in green leafy vegetables. The recommended daily intake is 120 mg for adult men and 90 mg for adult women. What type of physical activity is best for building and maintaining healthy bones? Weight-bearing and strength-building activities are important for building and maintaining healthy bones. Weight-bearing activities cause muscles and bones to work against gravity. Strength-building activities increase the strength of the muscles that support bones. Weight-bearing and muscle-building activities include:  Walking and hiking.  Jogging and running.  Dancing.  Gym exercises.  Lifting weights.  Tennis and racquetball.  Climbing stairs.  Aerobics. Adults should get at least 30 minutes of moderate physical activity on most days. Children should get at least 60 minutes of moderate physical activity on most days. Ask your health care provider what type of exercise is best for you. How can I find out if my bone mass is low? Bone mass can be measured with an X-ray test called a bone mineral density (BMD) test. This test is recommended for all women who are age 27 or older. It may also be recommended for:  Men who are age 62 or older.  People who are at risk for osteoporosis because of: ? Having bones that break easily. ? Having a long-term disease that weakens bones, such as kidney disease or rheumatoid  arthritis. ? Having menopause earlier than normal. ? Taking medicine that weakens bones, such as steroids, thyroid hormones, or hormone treatment for breast cancer or prostate cancer. ? Smoking. ? Drinking three or more alcoholic drinks a day. If you find that you have a low bone mass, you may be able to prevent osteoporosis or further bone loss by changing your diet and lifestyle. Where can I find more information? For more information, check out the following websites:  [place]: [employer]  [employer] of Health: www.bones.SouthExposed.es  International Osteoporosis Foundation: Administrator.iofbonehealth.org Summary  The aging process leads to an overall loss of bone mass in the body, which can increase the likelihood of broken bones and osteoporosis.  Eating a well-balanced diet with plenty of calcium and vitamin D will help to protect your bones.  Weight-bearing and strength-building activities are also important for building and maintaining strong bones.  Bone mass can be measured with an X-ray test called a bone mineral density (BMD) test. This information is not intended to replace advice given to you by your health care provider. Make sure you discuss any questions you have with your health care provider. Document Revised: 04/09/2017 Document Reviewed: 04/09/2017 Elsevier Patient Education  2020 [place].   Laparoscopically Assisted Vaginal Hysterectomy A laparoscopically assisted vaginal hysterectomy (LAVH) is a surgical procedure to remove the uterus and cervix. Sometimes, the ovaries and fallopian tubes are also removed. This surgery may be done to treat problems such as:  Noncancerous growths in the uterus (uterine fibroids) that cause symptoms.  A condition that causes the lining of the uterus to grow in other areas (endometriosis).  Problems with pelvic support.  Cancer of the cervix, ovaries, uterus, or tissue that lines the uterus  (endometrium).  Excessive (dysfunctional) uterine bleeding. During an LAVH, some of the surgical removal is done through the vagina, and the rest is done through a few small incisions in the abdomen. This technique may be an option for women who are not able to have a vaginal hysterectomy. Tell a  health care provider about:  Any allergies you have.  All medicines you are taking, including vitamins, herbs, eye drops, creams, and over-the-counter medicines.  Any problems you or family members have had with anesthetic medicines.  Any blood disorders you have.  Any surgeries you have had.  Any medical conditions you have.  Whether you are pregnant or may be pregnant. What are the risks? Generally, this is a safe procedure. However, problems may occur, including:  Infection.  Bleeding.  Allergic reactions to medicines.  Damage to other structures or organs.  Difficulty breathing. What happens before the procedure? Staying hydrated Follow instructions from your health care provider about hydration, which may include:  Up to 2 hours before the procedure - you may continue to drink clear liquids, such as water, clear fruit juice, black coffee, and plain tea. Eating and drinking restrictions Follow instructions from your health care provider about eating and drinking, which may include:  8 hours before the procedure - stop eating heavy meals or foods such as meat, fried foods, or fatty foods.  6 hours before the procedure - stop eating light meals or foods, such as toast or cereal.  6 hours before the procedure - stop drinking milk or drinks that contain milk.  2 hours before the procedure - stop drinking clear liquids. Medicines  Ask your health care provider about: ? Changing or stopping your regular medicines. This is especially important if you are taking diabetes medicines or blood thinners. ? Taking over-the-counter medicines, vitamins, herbs, and supplements. ? Taking  medicines such as aspirin and ibuprofen. These medicines can thin your blood. Do not take these medicines unless your health care provider tells you to take them.  You may be asked to take a medicine to empty your colon (bowel preparation).  You may be given antibiotic medicine to help prevent infection. General instructions  Plan to have someone take you home from the hospital or clinic.  Ask your health care provider how your surgical site will be marked or identified.  You may be asked to shower with a germ-killing soap.  Do not use any products that contain nicotine or tobacco, such as cigarettes and e-cigarettes. These can delay healing after surgery. If you need help quitting, ask your health care provider. What happens during the procedure?  To lower your risk of infection: ? Your health care team will wash or sanitize their hands. ? Hair may be removed from the surgical area. ? Your skin will be washed with soap.  An IV will be inserted into one of your veins.  You will be given one or more of the following: ? A medicine to help you relax (sedative). ? A medicine to make you fall asleep (general anesthetic).  You may have a flexible tube (catheter) put into your bladder to drain urine.  You may have a tube put through your nose or mouth down into your stomach (nasogastric tube). The nasogastric tube will remove digestive fluids and prevent nausea and vomiting.  Tight-fitting (compression) stockings will be placed on your legs to promote circulation.  Three or four small incisions will be made in your abdomen. An incision will also be made in your vagina.  Probes and tools will be inserted into the small incisions. The uterus and cervix (and possibly the ovaries and fallopian tubes) will be removed through your vagina as well as through the small incisions that were made in the abdomen.  The incisions will then be closed with stitches (  sutures). The procedure may vary  among health care providers and hospitals. What happens after the procedure?  Your blood pressure, heart rate, breathing rate, and blood oxygen level will be monitored until the medicines you were given have worn off.  You may have a liquid diet at first. You will most likely return to your usual diet the day after surgery.  You will still have the urinary catheter in place. It will likely be removed the day after surgery.  You may have to wear compression stockings. These stockings help to prevent blood clots and reduce swelling in your legs.  You will be encouraged to walk as soon as possible. You will also use a device or do breathing exercises to keep your lungs clear.  Do not drive for 24 hours if you were given a sedative. Summary  A laparoscopically assisted vaginal hysterectomy (LAVH) is a surgical procedure to remove the uterus and cervix, and sometimes the ovaries and fallopian tubes.  Follow instructions from your health care provider about eating and drinking before the procedure.  During an LAVH, some of the surgical removal is done through the vagina, and the rest is done through a few small incisions in the abdomen. This information is not intended to replace advice given to you by your health care provider. Make sure you discuss any questions you have with your health care provider. Document Revised: 05/06/2018 Document Reviewed: 06/08/2016 Elsevier Patient Education  2020 [employer].  Endometrial Ablation Endometrial ablation is a procedure that destroys the thin inner layer of the lining of the uterus (endometrium). This procedure may be done:  To stop heavy periods.  To stop bleeding that is causing anemia.  To control irregular bleeding.  To treat bleeding caused by small tumors (fibroids) in the endometrium. This procedure is often an alternative to major surgery, such as removal of the uterus and cervix (hysterectomy). As a result of this procedure:  You  may not be able to have children. However, if you are premenopausal (you have not gone through menopause): ? You may still have a small chance of getting pregnant. ? You will need to use a reliable method of birth control after the procedure to prevent pregnancy.  You may stop having a menstrual period, or you may have only a small amount of bleeding during your period. Menstruation may return several years after the procedure. Tell a health care provider about:  Any allergies you have.  All medicines you are taking, including vitamins, herbs, eye drops, creams, and over-the-counter medicines.  Any problems you or family members have had with the use of anesthetic medicines.  Any blood disorders you have.  Any surgeries you have had.  Any medical conditions you have. What are the risks? Generally, this is a safe procedure. However, problems may occur, including:  A hole (perforation) in the uterus or bowel.  Infection of the uterus, bladder, or vagina.  Bleeding.  Damage to other structures or organs.  An air bubble in the lung (air embolus).  Problems with pregnancy after the procedure.  Failure of the procedure.  Decreased ability to diagnose cancer in the endometrium. What happens before the procedure?  You will have tests of your endometrium to make sure there are no pre-cancerous cells or cancer cells present.  You may have an ultrasound of the uterus.  You may be given medicines to thin the endometrium.  Ask your health care provider about: ? Changing or stopping your regular medicines. This is  especially important if you take diabetes medicines or blood thinners. ? Taking medicines such as aspirin and ibuprofen. These medicines can thin your blood. Do not take these medicines before your procedure if your doctor tells you not to.  Plan to have someone take you home from the hospital or clinic. What happens during the procedure?   You will lie on an exam  table with your feet and legs supported as in a pelvic exam.  To lower your risk of infection: ? Your health care team will wash or sanitize their hands and put on germ-free (sterile) gloves. ? Your genital area will be washed with soap.  An IV tube will be inserted into one of your veins.  You will be given a medicine to help you relax (sedative).  A surgical instrument with a light and camera (resectoscope) will be inserted into your vagina and moved into your uterus. This allows your surgeon to see inside your uterus.  Endometrial tissue will be removed using one of the following methods: ? Radiofrequency. This method uses a radiofrequency-alternating electric current to remove the endometrium. ? Cryotherapy. This method uses extreme cold to freeze the endometrium. ? Heated-free liquid. This method uses a heated saltwater (saline) solution to remove the endometrium. ? Microwave. This method uses high-energy microwaves to heat up the endometrium and remove it. ? Thermal balloon. This method involves inserting a catheter with a balloon tip into the uterus. The balloon tip is filled with heated fluid to remove the endometrium. The procedure may vary among health care providers and hospitals. What happens after the procedure?  Your blood pressure, heart rate, breathing rate, and blood oxygen level will be monitored until the medicines you were given have worn off.  As tissue healing occurs, you may notice vaginal bleeding for 4-6 weeks after the procedure. You may also experience: ? Cramps. ? Thin, watery vaginal discharge that is light pink or brown in color. ? A need to urinate more frequently than usual. ? Nausea.  Do not drive for 24 hours if you were given a sedative.  Do not have sex or insert anything into your vagina until your health care provider approves. Summary  Endometrial ablation is done to treat the many causes of heavy menstrual bleeding.  The procedure may be  done only after medications have been tried to control the bleeding.  Plan to have someone take you home from the hospital or clinic. This information is not intended to replace advice given to you by your health care provider. Make sure you discuss any questions you have with your health care provider. Document Revised: 08/28/2017 Document Reviewed: 03/30/2016 Elsevier Patient Education  [place].

## 2019-11-11 NOTE — Progress Notes (Signed)
Gynecology Annual Exam  PCP: Patient, No Pcp Per  Chief Complaint:  Chief Complaint  Patient presents with  . Gynecologic Exam    annual exam    History of Present Illness: Patient is a 49 y.o. G2P1011 presents for annual exam. The patient has no complaints today.   LMP: Patient's last menstrual period was 11/05/2019. Average Interval: regular, 25 days Duration of flow: 7 days Heavy Menses: yes Clots: no Intermenstrual Bleeding: no Postcoital Bleeding: no Dysmenorrhea: no  Taking clindamycin for UTI prophylactic after intercourse. Has seen a significant improvement with recurrent UTI.   The patient is sexually active. She currently uses vasectomy for contraception. She denies dyspareunia.  The patient does perform self breast exams.  There is no notable family history of breast or ovarian cancer in her family.  The patient denies current symptoms of depression.    Review of Systems: Review of Systems  Constitutional: Negative for chills, fever, malaise/fatigue and weight loss.  HENT: Negative for congestion, hearing loss and sinus pain.   Eyes: Negative for blurred vision and double vision.  Respiratory: Negative for cough, sputum production, shortness of breath and wheezing.   Cardiovascular: Negative for chest pain, palpitations, orthopnea and leg swelling.  Gastrointestinal: Negative for abdominal pain, constipation, diarrhea, nausea and vomiting.  Genitourinary: Negative for dysuria, flank pain, frequency, hematuria and urgency.  Musculoskeletal: Negative for back pain, falls and joint pain.  Skin: Negative for itching and rash.  Neurological: Negative for dizziness and headaches.  Psychiatric/Behavioral: Negative for depression, substance abuse and suicidal ideas. The patient is not nervous/anxious.     Past Medical History:  There are no problems to display for this patient.   Past Surgical History:  Past Surgical History:  Procedure Laterality Date  .  BREAST BIOPSY Right 2010   stereotactic biopsy/ neg/ dr.byrnett  . BREAST BIOPSY Left 2012   neg/dr. brynett  . CESAREAN SECTION  2008   RPH  . TONSILLECTOMY  2006    Gynecologic History:  Patient's last menstrual period was 11/05/2019. Contraception: vasectomy Last Pap: Results were: 2020 NIL and HR HPV negative  Last mammogram: 2021  Results were: BI-RAD I  Obstetric History: G2P1011  Family History:  Family History  Problem Relation Age of Onset  . Endometrial cancer Mother 65  . Multiple sclerosis Sister 45  . Cancer Sister 100       appendix  . Autism Son        Infantile  . Diabetes type II Other        Aunt  . Bladder Cancer Neg Hx   . Kidney cancer Neg Hx     Social History:  Social History   Socioeconomic History  . Marital status: Married    Spouse name: Not on file  . Number of children: Not on file  . Years of education: Not on file  . Highest education level: Not on file  Occupational History  . Not on file  Tobacco Use  . Smoking status: Never Smoker  . Smokeless tobacco: Never Used  Vaping Use  . Vaping Use: Never used  Substance and Sexual Activity  . Alcohol use: Not Currently    Alcohol/week: 4.0 standard drinks    Types: 4 Glasses of wine per week  . Drug use: No  . Sexual activity: Yes    Birth control/protection: Surgical    Comment: Vasectomy  Other Topics Concern  . Not on file  Social History Narrative  . Not on file  Social Determinants of Health   Financial Resource Strain:   . Difficulty of Paying Living Expenses:   Food Insecurity:   . Worried About Charity fundraiser in the Last Year:   . Arboriculturist in the Last Year:   Transportation Needs:   . Film/video editor (Medical):   Marland Kitchen Lack of Transportation (Non-Medical):   Physical Activity:   . Days of Exercise per Week:   . Minutes of Exercise per Session:   Stress:   . Feeling of Stress :   Social Connections:   . Frequency of Communication with Friends  and Family:   . Frequency of Social Gatherings with Friends and Family:   . Attends Religious Services:   . Active Member of Clubs or Organizations:   . Attends Archivist Meetings:   Marland Kitchen Marital Status:   Intimate Partner Violence:   . Fear of Current or Ex-Partner:   . Emotionally Abused:   Marland Kitchen Physically Abused:   . Sexually Abused:     Allergies:  Allergies  Allergen Reactions  . Amoxicillin Other (See Comments)  . Other Other (See Comments)    Mushrooms - SOB  . Sulfa Antibiotics     Other reaction(s): Unknown    Medications: Prior to Admission medications   Medication Sig Start Date End Date Taking? Authorizing Provider  Ascorbic Acid (VITAMIN C) 1000 MG tablet Take 1,000 mg by mouth daily.   Yes [provider]  baclofen (LIORESAL) 10 MG tablet 10 mg 2 (two) times daily. 08/31/14  Yes [provider]  buPROPion (WELLBUTRIN SR) 100 MG 12 hr tablet TAKE 1 TABLET BY MOUTH TWICE DAILY AT 8AM AND 3PM 03/18/17  Yes [provider]  Cholecalciferol (PA VITAMIN D-3) 2000 UNITS CAPS Take 2,000 Units by mouth.   Yes [provider]  clonazePAM (KLONOPIN) 0.5 MG tablet TAKE 1 TABLET BY MOUTH EVERY DAY AS NEEDED **STOP XANAX** 02/22/17  Yes [provider]  cyanocobalamin 2000 MCG tablet Take 2,000 mcg by mouth daily.   Yes [provider]  dalfampridine (AMPYRA) 10 MG TB12 Take 10 mg by mouth 2 (two) times daily.   Yes [provider]  Dimethyl Fumarate (TECFIDERA) 240 MG CPDR 240 mg 1 day or 1 dose. 07/27/14  Yes [provider]  sertraline (ZOLOFT) 50 MG tablet Take 100 mg by mouth.   Yes [provider]    Physical Exam Vitals: Blood pressure 120/70, height 5\' 9"  (1.753 m), weight 186 lb (84.4 kg), last menstrual period 11/05/2019.  General: NAD HEENT: normocephalic, anicteric Thyroid: no enlargement, no palpable nodules Pulmonary: No increased work of breathing, CTAB Cardiovascular: RRR,  distal pulses 2+ Breast: Breast symmetrical, no tenderness, no palpable nodules or masses, no skin or nipple retraction present, no nipple discharge.  No axillary or supraclavicular lymphadenopathy. Abdomen: NABS, soft, non-tender, non-distended.  Umbilicus without lesions.  No hepatomegaly, splenomegaly or masses palpable. No evidence of hernia  Genitourinary:  External: Normal external female genitalia.  Normal urethral meatus, normal Bartholin's and Skene's glands.    Vagina: Normal vaginal mucosa, no evidence of prolapse.    Cervix: Grossly normal in appearance, no bleeding  Uterus: Non-enlarged, mobile, normal contour.  No CMT  Adnexa: ovaries non-enlarged, no adnexal masses  Rectal: deferred  Lymphatic: no evidence of inguinal lymphadenopathy Extremities: no edema, erythema, or tenderness Neurologic: Grossly intact Psychiatric: mood appropriate, affect full  Female chaperone present for pelvic and breast  portions of the physical exam  Assessment: 49 y.o. G2P1011 routine annual exam  Plan: Problem List Items Addressed This Visit    None    Visit Diagnoses    Encounter for annual routine gynecological examination    -  Primary   Health maintenance examination       Breast cancer screening by mammogram       Colon cancer screening       Relevant Orders   Ambulatory referral to Gastroenterology   Screening for osteoporosis       Screening for thyroid disorder       Screening for diabetes mellitus       Cervical cancer screening       Screening cholesterol level       Menorrhagia with regular cycle       Relevant Orders   US PELVIS TRANSVAGINAL NON-OB (TV ONLY)      1) Mammogram - recommend yearly screening mammogram.  Mammogram Is up to date  2) STI screening  was not offered and therefore not obtained  3) ASCCP guidelines and rational discussed.  Patient opts for every 5 years screening interval  4) Contraception - the patient is currently using  vasectomy.  She  is happy with her current form of contraception and plans to continue  5) Colonoscopy -- Screening recommended starting at age 78 for average risk individuals, age 49 for individuals deemed at increased risk (including African Americans) and recommended to continue until age 84.  For patient age 63-85 individualized approach is recommended.  Gold standard screening is via colonoscopy, Cologuard screening is an acceptable alternative for patient unwilling or unable to undergo colonoscopy.  "Colorectal cancer screening for average?risk adults: 2018 guideline update from the American Cancer Society"CA: A Cancer Journal for Clinicians: Aug 23, 2016   6) Routine healthcare maintenance including cholesterol, diabetes screening discussed managed by PCP  7) Increased risk Lynch syndrome based on family history- myrisk collected today.  8) Menorrhagia with bulky enlarged uterus, declines medical management. Will return for pelvic US, suspect fibroids  9) Return in about 4 weeks (around 12/09/2019) for GYN visit and [place].   [facility] 11/11/2019, 1:39 PM

## 2019-11-17 ENCOUNTER — Other Ambulatory Visit: Payer: Self-pay

## 2019-11-17 ENCOUNTER — Ambulatory Visit: Payer: BC Managed Care – PPO

## 2019-11-17 DIAGNOSIS — R262 Difficulty in walking, not elsewhere classified: Secondary | ICD-10-CM

## 2019-11-17 DIAGNOSIS — M6281 Muscle weakness (generalized): Secondary | ICD-10-CM

## 2019-11-18 NOTE — Therapy (Signed)
[facility] [address] [place], [address] Phone: [phone]   Fax:  [phone]  Physical Therapy Treatment  Patient Details  Name: Michelle Ruiz MRN: [id number] Date of Birth: Apr 19, 1970 No data recorded  Encounter Date: 11/17/2019   PT End of Session - 11/17/19 1715    Visit Number 101    Number of Visits 112    Date for PT Re-Evaluation 01/12/20    PT Start Time 1710    PT Stop Time 1750    PT Time Calculation (min) 40 min    Equipment Utilized During Treatment Gait belt    Activity Tolerance Patient tolerated treatment well;No increased pain;Other (comment)    Behavior During Therapy [facility] for tasks assessed/performed;Anxious           Past Medical History:  Diagnosis Date  . Abnormal Pap smear of cervix   . Anxiety   . Kidney stones   . MS (multiple sclerosis) ([place])   . UTI (urinary tract infection)     Past Surgical History:  Procedure Laterality Date  . BREAST BIOPSY Right 2010   stereotactic biopsy/ neg/ dr.byrnett  . BREAST BIOPSY Left 2012   neg/dr. brynett  . CESAREAN SECTION  2008   RPH  . TONSILLECTOMY  2006    There were no vitals filed for this visit.   Subjective Assessment - 11/17/19 1713    Subjective Patient states no major changes since the previous session. States her walking has been " fine". States no falls since the previous PN.    Pertinent History Patient reports history of falls x3 in the past 6 months secondary to drop foot on the L LE. PMH: Multiple sclerosis; elbow fracture of the R UE    How long can you stand comfortably? 20 min    How long can you walk comfortably? 1 miles    Patient Stated Goals To be more balanced and core strengthening     Currently in Pain? No/denies               INTERVENTION THIS DATE:  Therapeutic Exercise -Total Gym Squats Level 26 2x12 -Hip flexion with knee bent in standing - 5# x 20 onto 12" step with cone on top -  Ambulation with focus on improving speed - 1290ft  - Resisted dorsiflexion in sitting with 5# weights - x 20 B  -Total Gym SL Squats Level 17 1x12  bilat.  Performed exercises to address balance and strength limitations     PT Education - 11/17/19 1715    Education Details form/technique with exercise    Person(s) Educated Patient    Methods Explanation;Demonstration    Comprehension Verbalized understanding;Returned demonstration               PT Long Term Goals - 11/17/19 1718      PT LONG TERM GOAL #1   Title Patient will be independent with balance HEP to continue benefits of therapy after discharge.    Baseline dependent with form/technique for balance exercise; 03/25/2018: Independent     Time 6    Period Weeks    Status Achieved      PT LONG TERM GOAL #2   Title Patient will improve FGA by 4 points to indicate significant improvement with balancing while ambulating and decrease of fall risk    Baseline 03/25/2018: 28/30; 04/10/2019: 22; 07/21/2019: 29; 09/17/2019: 30/30    Time 6    Period [place]  Status Achieved      PT LONG TERM GOAL #3   Title Patient will be able to perform prolonged single leg stance >10sec with eyes closed to indicate improvement with static balance and decrease fall risk    Baseline 2 sec in standing EC; 03/25/2018: 3 sec; 04/29/2018: 4 sec; 08/21/2018: 5 sec; 09/19/2018: 6 sec ; 10/21/2018 3 sec; 11/21/2018 5 sec; 01/08/2019: 9 sec; 02/25/2019: 14sec    Time 4    Period Weeks    Status Achieved      PT LONG TERM GOAL #4   Title Pt will improve 6 MWT >2027ft to demonstrate improved cardiorespiratory fitness    Baseline Deferred to next session: 04/29/2018; 05/01/2018: 1461ft; 08/21/2018: 148ft; 09/19/2018: 1455ft; 10/21/2018 1621; 11/21/2018 deferred to next session 2/2 time.; 01/08/2019: 1480 ft; 02/25/2019: 1378ft; 04/10/2019: 1400 ft, 05/13/19: 1321 ft; 07/21/2019: 1170 ft; 09/17/2019: 1262ft; 11/17/2019: 1249ft    Time 4    Period Weeks    Status  On-going      PT LONG TERM GOAL #5   Title Patient will be able to run 253ft without stopping to better be able to return to recreational activites and address cardiovascular endurance.    Baseline 68ft; 02/25/2019: 55ft; 04/10/2019 deferred    Time 4    Period Weeks    Status Deferred      Additional Long Term Goals   Additional Long Term Goals Yes      PT LONG TERM GOAL #6   Title Patient will improve L hip flexion strength to 4/5 for improved steppage gait and reduced fall risk.    Baseline 04/10/2019: L hip flexion 3+/5; 07/21/2019: L Hip 4/5    Time 6    Period Weeks    Status Achieved      PT LONG TERM GOAL #7   Title Patient will be able to lift her affected L LE to the same extent as the R LE to allow for greater amount of foot clearance when ambulating    Baseline L LE: 10", R LE: 15"; 11/17/2019: L LE: 14", R LE 17.5"    Time 8    Period Weeks    Status New      PT LONG TERM GOAL #8   Title Patient will have full dorsiflexion with 5/5 strength to allow for greater toe clearance with the performance of exercises, most notably when walking.    Baseline L 4/5 5 degree ; R 5/5 12 degree    Time 8    Period Weeks    Status New                 Plan - 11/18/19 1009    Clinical Impression Statement Patient is making progress towards long term goals with ability to perform greater amount of hip flexion in standing, to a higher degree, compared to the previous progress note. This is indicative of greater foot clearance when ambulating. Although patient is improving, she conitnues to have difficulty walking for longer distances. Patient will benefit from further skilled therapy focused on improving limitations to return to prior level of function.    Personal Factors and Comorbidities Past/Current Experience    Examination-Activity Limitations Locomotion Level    Examination-Participation Restrictions Community Activity    Stability/Clinical Decision Making  Stable/Uncomplicated    Rehab Potential Good    Clinical Impairments Affecting Rehab Potential (-) hx of MS; (+) good motivation    PT Frequency 2x / week    PT Duration 4  weeks    PT Treatment/Interventions Moist Heat;Iontophoresis 4mg /ml Dexamethasone;Electrical Stimulation;Aquatic Therapy;Cryotherapy;Therapeutic activities;Neuromuscular re-education;Therapeutic exercise;Patient/family education;Dry needling;Manual techniques;Passive range of motion;Balance training;Functional mobility training;Gait training;Stair training;Joint Manipulations;Prosthetic Training    PT Next Visit Plan Dynamic balance and LE strength exercise    PT Home Exercise Plan hip flexion in sitting for strengthening, hip flexion knee flexion in standing for balance, bike intervals at 70%-75% max HR    Consulted and Agree with Plan of Care Patient           Patient will benefit from skilled therapeutic intervention in order to improve the following deficits and impairments:  Pain, Decreased coordination, Impaired perceived functional ability, Increased fascial restricitons, Increased muscle spasms, Difficulty walking, Abnormal gait, Decreased balance, Decreased mobility, Impaired sensation  Visit Diagnosis: Difficulty in walking, not elsewhere classified  Muscle weakness (generalized)     Problem List There are no problems to display for this patient.   [name], PT DPT 11/18/2019, 10:22 AM  [facility] [address], [place], [address] Phone: [phone]   Fax:  [phone]  Name: TUWANNA KRAUSZ MRN: [id number] Date of Birth: 08-Nov-1970

## 2019-11-20 ENCOUNTER — Encounter: Payer: Self-pay | Admitting: *Deleted

## 2019-11-24 ENCOUNTER — Other Ambulatory Visit: Payer: Self-pay

## 2019-11-24 ENCOUNTER — Ambulatory Visit: Payer: BC Managed Care – PPO

## 2019-11-24 DIAGNOSIS — Z9181 History of falling: Secondary | ICD-10-CM

## 2019-11-24 DIAGNOSIS — M6281 Muscle weakness (generalized): Secondary | ICD-10-CM

## 2019-11-24 DIAGNOSIS — R262 Difficulty in walking, not elsewhere classified: Secondary | ICD-10-CM | POA: Diagnosis not present

## 2019-11-24 NOTE — Therapy (Signed)
[facility] [address], [place], [address] Phone: [phone]   Fax:  [phone]  Physical Therapy Treatment  Patient Details  Name: Michelle Ruiz MRN: [id number] Date of Birth: 08/26/1970 No data recorded  Encounter Date: 11/24/2019   PT End of Session - 11/24/19 1731    Visit Number 102    Number of Visits 112    Date for PT Re-Evaluation 01/12/20    Authorization Type 9/10    PT Start Time 8502    PT Stop Time 1815    PT Time Calculation (min) 45 min    Equipment Utilized During Treatment Gait belt    Activity Tolerance Patient tolerated treatment well;No increased pain;Other (comment)    Behavior During Therapy [facility] for tasks assessed/performed;Anxious           Past Medical History:  Diagnosis Date   Abnormal Pap smear of cervix    Anxiety    Kidney stones    MS (multiple sclerosis) ([place])    UTI (urinary tract infection)     Past Surgical History:  Procedure Laterality Date   BREAST BIOPSY Right 2010   stereotactic biopsy/ neg/ dr.byrnett   BREAST BIOPSY Left 2012   neg/dr. brynett   CESAREAN SECTION  2008   [place]   TONSILLECTOMY  2006    There were no vitals filed for this visit.   Subjective Assessment - 11/24/19 1728    Subjective Patient reports no issues at todays session and that walking is going fine.    Patient is accompained by: Family member    Pertinent History Patient reports history of falls x3 in the past 6 months secondary to drop foot on the L LE. PMH: Multiple sclerosis; elbow fracture of the R UE    Limitations Standing;Walking;Lifting    How long can you stand comfortably? 20 min    How long can you walk comfortably? 1 miles    Patient Stated Goals To be more balanced and core strengthening     Currently in Pain? No/denies    Pain Onset More than a month ago           INTERVENTIONS    Therapeutic Exercises  Total Gym Squats Level 25 2x12 Total  Gym SL Squats Level 17 1x8 bilat. Step-ups with 3lb AW on 10in Step 2x8 bilat  Ambulation with focus on increasing step height and width spacing 2 hurdles 18inch apart (hip flexion with 3lb ankle weights x5 down and back  - Step through pattern x5 after step to pattern above  Hip Flexion Machine (R side 40lbs and L side 25lbs) 2x12 bilat. Hip Abduction on Machine (R side 25lbs and L side 25lbs) x10 bilat. Cone Taps on Airex pad (5 cones in each stack) x12 B with 3# AW Lunges with UE Support x8 B   Performed exercise to improve strength and balance         PT Education - 11/24/19 1730    Education Details form/technique with exercise    Person(s) Educated Patient    Methods Explanation;Demonstration    Comprehension Verbalized understanding;Returned demonstration               PT Long Term Goals - 11/17/19 1718      PT LONG TERM GOAL #1   Title Patient will be independent with balance HEP to continue benefits of therapy after discharge.    Baseline dependent with form/technique for balance exercise; 03/25/2018: Independent  Time 6    Period Weeks    Status Achieved      PT LONG TERM GOAL #2   Title Patient will improve FGA by 4 points to indicate significant improvement with balancing while ambulating and decrease of fall risk    Baseline 03/25/2018: 28/30; 04/10/2019: 22; 07/21/2019: 29; 09/17/2019: 30/30    Time 6    Period Weeks    Status Achieved      PT LONG TERM GOAL #3   Title Patient will be able to perform prolonged single leg stance >10sec with eyes closed to indicate improvement with static balance and decrease fall risk    Baseline 2 sec in standing EC; 03/25/2018: 3 sec; 04/29/2018: 4 sec; 08/21/2018: 5 sec; 09/19/2018: 6 sec ; 10/21/2018 3 sec; 11/21/2018 5 sec; 01/08/2019: 9 sec; 02/25/2019: 14sec    Time 4    Period Weeks    Status Achieved      PT LONG TERM GOAL #4   Title Pt will improve 6 MWT >2037ft to demonstrate improved cardiorespiratory fitness     Baseline Deferred to next session: 04/29/2018; 05/01/2018: 1472ft; 08/21/2018: 1435ft; 09/19/2018: 1444ft; 10/21/2018 1621; 11/21/2018 deferred to next session 2/2 time.; 01/08/2019: 1480 ft; 02/25/2019: 1357ft; 04/10/2019: 1400 ft, 05/13/19: 1321 ft; 07/21/2019: 1170 ft; 09/17/2019: 1271ft; 11/17/2019: 1218ft    Time 4    Period Weeks    Status On-going      PT LONG TERM GOAL #5   Title Patient will be able to run 283ft without stopping to better be able to return to recreational activites and address cardiovascular endurance.    Baseline 22ft; 02/25/2019: 64ft; 04/10/2019 deferred    Time 4    Period Weeks    Status Deferred      Additional Long Term Goals   Additional Long Term Goals Yes      PT LONG TERM GOAL #6   Title Patient will improve L hip flexion strength to 4/5 for improved steppage gait and reduced fall risk.    Baseline 04/10/2019: L hip flexion 3+/5; 07/21/2019: L Hip 4/5    Time 6    Period Weeks    Status Achieved      PT LONG TERM GOAL #7   Title Patient will be able to lift her affected L LE to the same extent as the R LE to allow for greater amount of foot clearance when ambulating    Baseline L LE: 10", R LE: 15"; 11/17/2019: L LE: 14", R LE 17.5"    Time 8    Period Weeks    Status New      PT LONG TERM GOAL #8   Title Patient will have full dorsiflexion with 5/5 strength to allow for greater toe clearance with the performance of exercises, most notably when walking.    Baseline L 4/5 5 degree ; R 5/5 12 degree    Time 8    Period Weeks    Status New                 Plan - 11/24/19 1731    Clinical Impression Statement Focused on improving patients overall LE strength and endurance during todays session.  Increased overall volume by increasing resistance and reps of exercises at which the patient tolerated well with minimal muscular fatigue at end of session.  Patient hip flexion is improving but continues to be less when compared to unaffected leg. Patient is  progressing towards goals and will benefit from skilled therapy  to return to prior level of function.    Personal Factors and Comorbidities Past/Current Experience    Examination-Activity Limitations Locomotion Level    Examination-Participation Restrictions Community Activity    Stability/Clinical Decision Making Stable/Uncomplicated    Clinical Decision Making Low    Rehab Potential Good    Clinical Impairments Affecting Rehab Potential (-) hx of MS; (+) good motivation    PT Frequency 2x / week    PT Duration 4 weeks    PT Treatment/Interventions Moist Heat;Iontophoresis 4mg /ml Dexamethasone;Electrical Stimulation;Aquatic Therapy;Cryotherapy;Therapeutic activities;Neuromuscular re-education;Therapeutic exercise;Patient/family education;Dry needling;Manual techniques;Passive range of motion;Balance training;Functional mobility training;Gait training;Stair training;Joint Manipulations;Prosthetic Training    PT Next Visit Plan Dynamic balance and LE strength exercise    PT Home Exercise Plan hip flexion in sitting for strengthening, hip flexion knee flexion in standing for balance, bike intervals at 70%-75% max HR    Consulted and Agree with Plan of Care Patient           Patient will benefit from skilled therapeutic intervention in order to improve the following deficits and impairments:  Pain, Decreased coordination, Impaired perceived functional ability, Increased fascial restricitons, Increased muscle spasms, Difficulty walking, Abnormal gait, Decreased balance, Decreased mobility, Impaired sensation  Visit Diagnosis: Difficulty in walking, not elsewhere classified  Muscle weakness (generalized)  History of falling     Problem List There are no problems to display for this patient.  6:15 PM, 11/24/19 Margarito Liner, SPT Student Physical Therapist [place]  [phone]  Margarito Liner 11/24/2019, 6:05 PM  [facility] [address], [place], [address] Phone: [phone]   Fax:  [phone]  Name: Michelle Ruiz MRN: [id number] Date of Birth: 04-Dec-1970

## 2019-12-03 ENCOUNTER — Other Ambulatory Visit: Payer: Self-pay

## 2019-12-03 ENCOUNTER — Ambulatory Visit: Payer: BC Managed Care – PPO | Attending: Neurology

## 2019-12-03 DIAGNOSIS — R262 Difficulty in walking, not elsewhere classified: Secondary | ICD-10-CM | POA: Diagnosis present

## 2019-12-03 DIAGNOSIS — Z9181 History of falling: Secondary | ICD-10-CM | POA: Diagnosis present

## 2019-12-03 DIAGNOSIS — M6281 Muscle weakness (generalized): Secondary | ICD-10-CM | POA: Diagnosis present

## 2019-12-03 NOTE — Therapy (Signed)
[facility] [address] [place], [address] Phone: [phone]   Fax:  [phone]  Physical Therapy Treatment  Patient Details  Name: Michelle Ruiz MRN: [id number] Date of Birth: 19-Apr-1970 No data recorded  Encounter Date: 12/03/2019   PT End of Session - 12/03/19 1825    Visit Number 103    Number of Visits 112    Date for PT Re-Evaluation 01/12/20    Authorization Type 3/10    PT Start Time 1815    PT Stop Time 1900    PT Time Calculation (min) 45 min    Equipment Utilized During Treatment Gait belt    Activity Tolerance Patient tolerated treatment well;No increased pain;Other (comment)    Behavior During Therapy [facility] for tasks assessed/performed;Anxious           Past Medical History:  Diagnosis Date  . Abnormal Pap smear of cervix   . Anxiety   . Kidney stones   . MS (multiple sclerosis) ([place])   . UTI (urinary tract infection)     Past Surgical History:  Procedure Laterality Date  . BREAST BIOPSY Right 2010   stereotactic biopsy/ neg/ dr.byrnett  . BREAST BIOPSY Left 2012   neg/dr. brynett  . CESAREAN SECTION  2008   [facility]  . TONSILLECTOMY  2006    There were no vitals filed for this visit.   Subjective Assessment - 12/03/19 1812    Subjective Pateint states she felt very tired after the previous session. She reports she had to go to bed earlier than normal becuase of this.    Patient is accompained by: Family member    Pertinent History Patient reports history of falls x3 in the past 6 months secondary to drop foot on the L LE. PMH: Multiple sclerosis; elbow fracture of the R UE    Limitations Standing;Walking;Lifting    How long can you stand comfortably? 20 min    How long can you walk comfortably? 1 miles    Patient Stated Goals To be more balanced and core strengthening     Currently in Pain? No/denies    Pain Onset More than a month ago           INTERVENTIONS   Therapeutic Exercises  Total Gym Squats Level 25 2x12  Total Gym SL Squats Level 17 1x8 bilat.  Hurdles (6 hurdles ~72ft apart) x5 down/back  Hip Flexion Machine (R side 40lbs and L side 25lbs) 2x10 bilat.  Hip Abduction on Machine (R side 25lbs and L side 25lbs) x10 bilat. Reverse lunges with TRX -- x8 Lateral lunges with TRX - x5 Feet together balance on airex - x 25 ball tosses  Performed exercises to address LE weakness and balance difficulty    PT Education - 12/03/19 1822    Education Details form/technique with exercise    Person(s) Educated Patient    Methods Explanation;Demonstration    Comprehension Verbalized understanding;Returned demonstration               PT Long Term Goals - 11/17/19 1718      PT LONG TERM GOAL #1   Title Patient will be independent with balance HEP to continue benefits of therapy after discharge.    Baseline dependent with form/technique for balance exercise; 03/25/2018: Independent     Time 6    Period Weeks    Status Achieved      PT LONG TERM GOAL #2   Title Patient  will improve FGA by 4 points to indicate significant improvement with balancing while ambulating and decrease of fall risk    Baseline 03/25/2018: 28/30; 04/10/2019: 22; 07/21/2019: 29; 09/17/2019: 30/30    Time 6    Period Weeks    Status Achieved      PT LONG TERM GOAL #3   Title Patient will be able to perform prolonged single leg stance >10sec with eyes closed to indicate improvement with static balance and decrease fall risk    Baseline 2 sec in standing EC; 03/25/2018: 3 sec; 04/29/2018: 4 sec; 08/21/2018: 5 sec; 09/19/2018: 6 sec ; 10/21/2018 3 sec; 11/21/2018 5 sec; 01/08/2019: 9 sec; 02/25/2019: 14sec    Time 4    Period Weeks    Status Achieved      PT LONG TERM GOAL #4   Title Pt will improve 6 MWT >2038ft to demonstrate improved cardiorespiratory fitness    Baseline Deferred to next session: 04/29/2018; 05/01/2018: 1424ft; 08/21/2018: 1470ft; 09/19/2018: 1459ft;  10/21/2018 1621; 11/21/2018 deferred to next session 2/2 time.; 01/08/2019: 1480 ft; 02/25/2019: 1344ft; 04/10/2019: 1400 ft, 05/13/19: 1321 ft; 07/21/2019: 1170 ft; 09/17/2019: 1278ft; 11/17/2019: 1270ft    Time 4    Period Weeks    Status On-going      PT LONG TERM GOAL #5   Title Patient will be able to run 28ft without stopping to better be able to return to recreational activites and address cardiovascular endurance.    Baseline 37ft; 02/25/2019: 38ft; 04/10/2019 deferred    Time 4    Period Weeks    Status Deferred      Additional Long Term Goals   Additional Long Term Goals Yes      PT LONG TERM GOAL #6   Title Patient will improve L hip flexion strength to 4/5 for improved steppage gait and reduced fall risk.    Baseline 04/10/2019: L hip flexion 3+/5; 07/21/2019: L Hip 4/5    Time 6    Period Weeks    Status Achieved      PT LONG TERM GOAL #7   Title Patient will be able to lift her affected L LE to the same extent as the R LE to allow for greater amount of foot clearance when ambulating    Baseline L LE: 10", R LE: 15"; 11/17/2019: L LE: 14", R LE 17.5"    Time 8    Period Weeks    Status New      PT LONG TERM GOAL #8   Title Patient will have full dorsiflexion with 5/5 strength to allow for greater toe clearance with the performance of exercises, most notably when walking.    Baseline L 4/5 5 degree ; R 5/5 12 degree    Time 8    Period Weeks    Status New                 Plan - 12/03/19 1832    Clinical Impression Statement Decreased exercise volume today secondary to increased soreness after the previous session. Patient demosntrates good carryover betweem sessions with greater amount of hip flexion on the L LE. Patient is making singificant improvements however, continues to have difficulty with walking. Patient will benefit from further skilled therapy focused on improving limitations to return to prior level of function.    Personal Factors and Comorbidities  Past/Current Experience    Examination-Activity Limitations Locomotion Level    Examination-Participation Restrictions Community Activity    Stability/Clinical Decision Making Stable/Uncomplicated  Rehab Potential Good    Clinical Impairments Affecting Rehab Potential (-) hx of MS; (+) good motivation    PT Frequency 2x / week    PT Duration 4 weeks    PT Treatment/Interventions Moist Heat;Iontophoresis 4mg /ml Dexamethasone;Electrical Stimulation;Aquatic Therapy;Cryotherapy;Therapeutic activities;Neuromuscular re-education;Therapeutic exercise;Patient/family education;Dry needling;Manual techniques;Passive range of motion;Balance training;Functional mobility training;Gait training;Stair training;Joint Manipulations;Prosthetic Training    PT Next Visit Plan Dynamic balance and LE strength exercise    PT Home Exercise Plan hip flexion in sitting for strengthening, hip flexion knee flexion in standing for balance, bike intervals at 70%-75% max HR    Consulted and Agree with Plan of Care Patient           Patient will benefit from skilled therapeutic intervention in order to improve the following deficits and impairments:  Pain, Decreased coordination, Impaired perceived functional ability, Increased fascial restricitons, Increased muscle spasms, Difficulty walking, Abnormal gait, Decreased balance, Decreased mobility, Impaired sensation  Visit Diagnosis: Difficulty in walking, not elsewhere classified  Muscle weakness (generalized)  History of falling     Problem List There are no problems to display for this patient.   [name], PT DPT 12/03/2019, 7:09 PM  [facility] [address], [place], [address] Phone: [phone]   Fax:  [phone]  Name: MALISA RUGGIERO MRN: [id number] Date of Birth: [birth year]

## 2019-12-09 ENCOUNTER — Other Ambulatory Visit: Payer: Self-pay

## 2019-12-09 ENCOUNTER — Encounter: Payer: Self-pay | Admitting: Obstetrics and Gynecology

## 2019-12-09 ENCOUNTER — Ambulatory Visit (INDEPENDENT_AMBULATORY_CARE_PROVIDER_SITE_OTHER): Payer: BC Managed Care – PPO

## 2019-12-09 ENCOUNTER — Ambulatory Visit (INDEPENDENT_AMBULATORY_CARE_PROVIDER_SITE_OTHER): Payer: BC Managed Care – PPO | Admitting: Obstetrics and Gynecology

## 2019-12-09 VITALS — BP 120/68 | HR 90 | Resp 18 | Ht 69.0 in | Wt 183.4 lb

## 2019-12-09 DIAGNOSIS — N920 Excessive and frequent menstruation with regular cycle: Secondary | ICD-10-CM

## 2019-12-09 NOTE — Progress Notes (Signed)
Patient ID: Michelle Ruiz, female   DOB: Apr 08, 1970, 49 y.o.   MRN: [id number]  Reason for Consult: Gynecologic Exam ([place] this morning)   Referred by Gilman Schmidt, Eyvonne Burchfield R, *  Subjective:     HPI:  Michelle Ruiz is a 49 y.o. female. She id following up for menorrhagia.    Past Medical History:  Diagnosis Date  . Abnormal Pap smear of cervix   . Anxiety   . Kidney stones   . MS (multiple sclerosis) ([place])   . UTI (urinary tract infection)    Family History  Problem Relation Age of Onset  . Endometrial cancer Mother 24  . Multiple sclerosis Sister 31  . Cancer Sister 74       appendix  . Autism Son        Infantile  . Diabetes type II Other        Aunt  . Bladder Cancer Neg Hx   . Kidney cancer Neg Hx    Past Surgical History:  Procedure Laterality Date  . BREAST BIOPSY Right 2010   stereotactic biopsy/ neg/ dr.byrnett  . BREAST BIOPSY Left 2012   neg/dr. brynett  . CESAREAN SECTION  2008   [facility]  . TONSILLECTOMY  2006    Short Social History:  Social History   Tobacco Use  . Smoking status: Never Smoker  . Smokeless tobacco: Never Used  Substance Use Topics  . Alcohol use: Not Currently    Alcohol/week: 4.0 standard drinks    Types: 4 Glasses of wine per week    Allergies  Allergen Reactions  . Amoxicillin Other (See Comments)  . Other Other (See Comments)    Mushrooms - SOB  . Sulfa Antibiotics     Other reaction(s): Unknown    Current Outpatient Medications  Medication Sig Dispense Refill  . Ascorbic Acid (VITAMIN C) 1000 MG tablet Take 1,000 mg by mouth daily.    . baclofen (LIORESAL) 10 MG tablet 10 mg 2 (two) times daily.    Marland Kitchen buPROPion (WELLBUTRIN SR) 100 MG 12 hr tablet TAKE 1 TABLET BY MOUTH TWICE DAILY AT 8AM AND 3PM  1  . Cholecalciferol (PA VITAMIN D-3) 2000 UNITS CAPS Take 2,000 Units by mouth.    . clonazePAM (KLONOPIN) 0.5 MG tablet TAKE 1 TABLET BY MOUTH EVERY DAY AS NEEDED **STOP XANAX**  3  . cyanocobalamin 2000 MCG tablet Take  2,000 mcg by mouth daily.    Marland Kitchen dalfampridine (AMPYRA) 10 MG TB12 Take 10 mg by mouth 2 (two) times daily.    . Dimethyl Fumarate (TECFIDERA) 240 MG CPDR 240 mg 1 day or 1 dose.    . sertraline (ZOLOFT) 50 MG tablet Take 100 mg by mouth.     No current facility-administered medications for this visit.    Review of Systems  Constitutional: Negative for chills, fatigue, fever and unexpected weight change.  HENT: Negative for trouble swallowing.  Eyes: Negative for loss of vision.  Respiratory: Negative for cough, shortness of breath and wheezing.  Cardiovascular: Negative for chest pain, leg swelling, palpitations and syncope.  GI: Negative for abdominal pain, blood in stool, diarrhea, nausea and vomiting.  GU: Negative for difficulty urinating, dysuria, frequency and hematuria.  Musculoskeletal: Negative for back pain, leg pain and joint pain.  Skin: Negative for rash.  Neurological: Negative for dizziness, headaches, light-headedness, numbness and seizures.  Psychiatric: Negative for behavioral problem, confusion, depressed mood and sleep disturbance.        Objective:  Objective  Vitals:   12/09/19 0910  BP: 120/68  Pulse: 90  Resp: 18  SpO2: 99%  Weight: 183 lb 6.4 oz (83.2 kg)  Height: 5\' 9"  (1.753 m)   Body mass index is 27.08 kg/m.  Physical Exam Vitals and nursing note reviewed.  Constitutional:      Appearance: She is well-developed.  HENT:     Head: Normocephalic and atraumatic.  Eyes:     Pupils: Pupils are equal, round, and reactive to light.  Cardiovascular:     Rate and Rhythm: Normal rate and regular rhythm.  Pulmonary:     Effort: Pulmonary effort is normal. No respiratory distress.  Skin:    General: Skin is warm and dry.  Neurological:     Mental Status: She is alert and oriented to person, place, and time.  Psychiatric:        Behavior: Behavior normal.        Thought Content: Thought content normal.        Judgment: Judgment normal.          Assessment/Plan:     49 yo with menorrhagia- improved per patient, changing tampon every 4 hours.  Discussed [place] results Discussed pedunculated fibroid.  Discussed management of menorrhogia Not able to have Myrisk testing because family history was insufficient Will follow up as needed.  More than 25 minutes were spent face to face with the patient in the room, reviewing the medical record, labs and images, and coordinating care for the patient. The plan of management was discussed in detail and counseling was provided.      Adrian Prows MD [facility], [facility] 12/09/2019 9:46 AM

## 2019-12-10 ENCOUNTER — Ambulatory Visit: Payer: BC Managed Care – PPO

## 2019-12-10 DIAGNOSIS — Z9181 History of falling: Secondary | ICD-10-CM

## 2019-12-10 DIAGNOSIS — M6281 Muscle weakness (generalized): Secondary | ICD-10-CM

## 2019-12-10 DIAGNOSIS — R262 Difficulty in walking, not elsewhere classified: Secondary | ICD-10-CM | POA: Diagnosis not present

## 2019-12-10 NOTE — Therapy (Signed)
[facility] [address], [place], [address] Phone: [phone]   Fax:  [phone]  Physical Therapy Treatment  Patient Details  Name: Michelle Ruiz MRN: [id number] Date of Birth: [birth year] No data recorded  Encounter Date: 12/10/2019   PT End of Session - 12/10/19 1735    Visit Number 104    Number of Visits 112    Date for PT Re-Evaluation 01/12/20    Authorization Type 4/10    PT Start Time 1730    PT Stop Time 1815    PT Time Calculation (min) 45 min    Equipment Utilized During Treatment Gait belt    Activity Tolerance Patient tolerated treatment well;No increased pain;Other (comment)    Behavior During Therapy [facility] for tasks assessed/performed;Anxious           Past Medical History:  Diagnosis Date  . Abnormal Pap smear of cervix   . Anxiety   . Kidney stones   . MS (multiple sclerosis) ([place])   . UTI (urinary tract infection)     Past Surgical History:  Procedure Laterality Date  . BREAST BIOPSY Right 2010   stereotactic biopsy/ neg/ dr.byrnett  . BREAST BIOPSY Left 2012   neg/dr. brynett  . CESAREAN SECTION  2008   RPH  . TONSILLECTOMY  2006    There were no vitals filed for this visit.   Subjective Assessment - 12/10/19 1734    Subjective Patient reports she has been having some anterior knee pain during/after walking.    Patient is accompained by: Family member    Pertinent History Patient reports history of falls x3 in the past 6 months secondary to drop foot on the L LE. PMH: Multiple sclerosis; elbow fracture of the R UE    Limitations Standing;Walking;Lifting    How long can you stand comfortably? 20 min    How long can you walk comfortably? 1 miles    Patient Stated Goals To be more balanced and core strengthening     Currently in Pain? No/denies    Pain Onset More than a month ago           INTERVENTIONS   Therapeutic Exercises   Total Gym Squats Level 25 2x12    Total Gym SL Squats Level 17 1x8 bilat.  Ladder Forward stepping with increased hip flexion 2x10  Hip Flexion Machine (R side 40lbs and L side 25lbs) 2x10 bilat.   Hip Abduction on Machine (R side 25lbs and L side 25lbs) x10 bilat.  Reverse lunges with TRX -- x8 B  TRX Squat - x10  Tandem Stance Balance on AirEx - x 20 ball tosses    Performed exercises to address LE weakness and balance difficulty     PT Education - 12/10/19 1735    Education Details form/technique with exercise    Person(s) Educated Patient    Methods Explanation;Demonstration    Comprehension Verbalized understanding;Returned demonstration               PT Long Term Goals - 11/17/19 1718      PT LONG TERM GOAL #1   Title Patient will be independent with balance HEP to continue benefits of therapy after discharge.    Baseline dependent with form/technique for balance exercise; 03/25/2018: Independent     Time 6    Period Weeks    Status Achieved      PT LONG TERM GOAL #2   Title Patient will improve  FGA by 4 points to indicate significant improvement with balancing while ambulating and decrease of fall risk    Baseline 03/25/2018: 28/30; 04/10/2019: 22; 07/21/2019: 29; 09/17/2019: 30/30    Time 6    Period Weeks    Status Achieved      PT LONG TERM GOAL #3   Title Patient will be able to perform prolonged single leg stance >10sec with eyes closed to indicate improvement with static balance and decrease fall risk    Baseline 2 sec in standing EC; 03/25/2018: 3 sec; 04/29/2018: 4 sec; 08/21/2018: 5 sec; 09/19/2018: 6 sec ; 10/21/2018 3 sec; 11/21/2018 5 sec; 01/08/2019: 9 sec; 02/25/2019: 14sec    Time 4    Period Weeks    Status Achieved      PT LONG TERM GOAL #4   Title Pt will improve 6 MWT >208ft to demonstrate improved cardiorespiratory fitness    Baseline Deferred to next session: 04/29/2018; 05/01/2018: 1453ft; 08/21/2018: 1484ft; 09/19/2018: 1415ft; 10/21/2018 1621; 11/21/2018 deferred to next session 2/2  time.; 01/08/2019: 1480 ft; 02/25/2019: 1325ft; 04/10/2019: 1400 ft, 05/13/19: 1321 ft; 07/21/2019: 1170 ft; 09/17/2019: 1271ft; 11/17/2019: 1250ft    Time 4    Period Weeks    Status On-going      PT LONG TERM GOAL #5   Title Patient will be able to run 262ft without stopping to better be able to return to recreational activites and address cardiovascular endurance.    Baseline 52ft; 02/25/2019: 1ft; 04/10/2019 deferred    Time 4    Period Weeks    Status Deferred      Additional Long Term Goals   Additional Long Term Goals Yes      PT LONG TERM GOAL #6   Title Patient will improve L hip flexion strength to 4/5 for improved steppage gait and reduced fall risk.    Baseline 04/10/2019: L hip flexion 3+/5; 07/21/2019: L Hip 4/5    Time 6    Period Weeks    Status Achieved      PT LONG TERM GOAL #7   Title Patient will be able to lift her affected L LE to the same extent as the R LE to allow for greater amount of foot clearance when ambulating    Baseline L LE: 10", R LE: 15"; 11/17/2019: L LE: 14", R LE 17.5"    Time 8    Period Weeks    Status New      PT LONG TERM GOAL #8   Title Patient will have full dorsiflexion with 5/5 strength to allow for greater toe clearance with the performance of exercises, most notably when walking.    Baseline L 4/5 5 degree ; R 5/5 12 degree    Time 8    Period Weeks    Status New                 Plan - 12/10/19 1752    Clinical Impression Statement Continued to focus on improving patients hip flexion and walking during today's session with appropriate rest. Patient tolerated exercises well with mild fatigue by end of session.  Required verbal cues to increase hip flexion excursion during walking exercises.  Patient will benefit from further skilled therapy focused on improving remaining limitations to return to prior level of function.    Personal Factors and Comorbidities Past/Current Experience    Examination-Activity Limitations Locomotion  Level    Examination-Participation Restrictions Community Activity    Stability/Clinical Decision Making Stable/Uncomplicated  Clinical Decision Making Low    Rehab Potential Good    Clinical Impairments Affecting Rehab Potential (-) hx of MS; (+) good motivation    PT Frequency 2x / week    PT Duration 4 weeks    PT Treatment/Interventions Moist Heat;Iontophoresis 4mg /ml Dexamethasone;Electrical Stimulation;Aquatic Therapy;Cryotherapy;Therapeutic activities;Neuromuscular re-education;Therapeutic exercise;Patient/family education;Dry needling;Manual techniques;Passive range of motion;Balance training;Functional mobility training;Gait training;Stair training;Joint Manipulations;Prosthetic Training    PT Next Visit Plan Dynamic balance and LE strength exercise    PT Home Exercise Plan hip flexion in sitting for strengthening, hip flexion knee flexion in standing for balance, bike intervals at 70%-75% max HR    Consulted and Agree with Plan of Care Patient           Patient will benefit from skilled therapeutic intervention in order to improve the following deficits and impairments:  Pain, Decreased coordination, Impaired perceived functional ability, Increased fascial restricitons, Increased muscle spasms, Difficulty walking, Abnormal gait, Decreased balance, Decreased mobility, Impaired sensation  Visit Diagnosis: Difficulty in walking, not elsewhere classified  Muscle weakness (generalized)  History of falling     Problem List There are no problems to display for this patient.  6:14 PM, 12/10/19 Margarito Liner, SPT Student Physical Therapist [place]  [phone]  Michelle Ruiz 12/10/2019, 6:08 PM  [facility] [address] [place], [address] Phone: [phone]   Fax:  [phone]  Name: Michelle Ruiz MRN: [id number] Date of Birth: 1970/08/28

## 2019-12-17 ENCOUNTER — Other Ambulatory Visit: Payer: Self-pay

## 2019-12-17 ENCOUNTER — Ambulatory Visit: Payer: BC Managed Care – PPO

## 2019-12-17 DIAGNOSIS — R262 Difficulty in walking, not elsewhere classified: Secondary | ICD-10-CM

## 2019-12-17 DIAGNOSIS — M6281 Muscle weakness (generalized): Secondary | ICD-10-CM

## 2019-12-17 DIAGNOSIS — Z9181 History of falling: Secondary | ICD-10-CM

## 2019-12-17 NOTE — Therapy (Signed)
[facility] [address] [place], [address] Phone: [phone]   Fax:  [phone]  Physical Therapy Treatment  Patient Details  Name: Michelle Ruiz MRN: [id number] Date of Birth: 1970-11-26 No data recorded  Encounter Date: 12/17/2019   PT End of Session - 12/17/19 1813    Visit Number 105    Number of Visits 112    Date for PT Re-Evaluation 01/12/20    Authorization Type 5/10    PT Start Time 1815    PT Stop Time 1900    PT Time Calculation (min) 45 min    Equipment Utilized During Treatment Gait belt    Activity Tolerance Patient tolerated treatment well;No increased pain;Other (comment)    Behavior During Therapy [facility] for tasks assessed/performed;Anxious           Past Medical History:  Diagnosis Date  . Abnormal Pap smear of cervix   . Anxiety   . Kidney stones   . MS (multiple sclerosis) ([place])   . UTI (urinary tract infection)     Past Surgical History:  Procedure Laterality Date  . BREAST BIOPSY Right 2010   stereotactic biopsy/ neg/ dr.byrnett  . BREAST BIOPSY Left 2012   neg/dr. brynett  . CESAREAN SECTION  2008   RPH  . TONSILLECTOMY  2006    There were no vitals filed for this visit.   Subjective Assessment - 12/17/19 1812    Subjective Patient reports no issues at todays session.    Patient is accompained by: Family member    Pertinent History Patient reports history of falls x3 in the past 6 months secondary to drop foot on the L LE. PMH: Multiple sclerosis; elbow fracture of the R UE    Limitations Standing;Walking;Lifting    How long can you stand comfortably? 20 min    How long can you walk comfortably? 1 miles    Patient Stated Goals To be more balanced and core strengthening     Currently in Pain? No/denies    Pain Onset More than a month ago          INTERVENTIONS    Therapeutic Exercises   Total Gym Squats Level 26 3x12   Total Gym SL Squats Level 17 1x8  bilat.  Toe Taps 20.5 inch Step 2x8 B  Reverse lunges with TRX -- x8 B  TRX Squat - x15  Hip Flexion Machine (R side 40lbs and L side 25lbs) 2x10 bilat.   Hip Abduction on Machine (R side 25lbs and L side 25lbs) x10 bilat.  Ambulation x547ft  with 2#AW   Tandem Stance Ambulation x56ft     Performed exercises to address LE weakness and balance difficulty   PT Education - 12/17/19 1813    Education Details form/technique with exercise    Person(s) Educated Patient    Methods Explanation;Demonstration    Comprehension Verbalized understanding;Returned demonstration               PT Long Term Goals - 11/17/19 1718      PT LONG TERM GOAL #1   Title Patient will be independent with balance HEP to continue benefits of therapy after discharge.    Baseline dependent with form/technique for balance exercise; 03/25/2018: Independent     Time 6    Period Weeks    Status Achieved      PT LONG TERM GOAL #2   Title Patient will improve FGA by 4 points to indicate  significant improvement with balancing while ambulating and decrease of fall risk    Baseline 03/25/2018: 28/30; 04/10/2019: 22; 07/21/2019: 29; 09/17/2019: 30/30    Time 6    Period Weeks    Status Achieved      PT LONG TERM GOAL #3   Title Patient will be able to perform prolonged single leg stance >10sec with eyes closed to indicate improvement with static balance and decrease fall risk    Baseline 2 sec in standing EC; 03/25/2018: 3 sec; 04/29/2018: 4 sec; 08/21/2018: 5 sec; 09/19/2018: 6 sec ; 10/21/2018 3 sec; 11/21/2018 5 sec; 01/08/2019: 9 sec; 02/25/2019: 14sec    Time 4    Period Weeks    Status Achieved      PT LONG TERM GOAL #4   Title Pt will improve 6 MWT >2050ft to demonstrate improved cardiorespiratory fitness    Baseline Deferred to next session: 04/29/2018; 05/01/2018: 142ft; 08/21/2018: 1445ft; 09/19/2018: 1438ft; 10/21/2018 1621; 11/21/2018 deferred to next session 2/2 time.; 01/08/2019: 1480 ft; 02/25/2019: 1382ft;  04/10/2019: 1400 ft, 05/13/19: 1321 ft; 07/21/2019: 1170 ft; 09/17/2019: 1268ft; 11/17/2019: 1241ft    Time 4    Period Weeks    Status On-going      PT LONG TERM GOAL #5   Title Patient will be able to run 230ft without stopping to better be able to return to recreational activites and address cardiovascular endurance.    Baseline 48ft; 02/25/2019: 75ft; 04/10/2019 deferred    Time 4    Period Weeks    Status Deferred      Additional Long Term Goals   Additional Long Term Goals Yes      PT LONG TERM GOAL #6   Title Patient will improve L hip flexion strength to 4/5 for improved steppage gait and reduced fall risk.    Baseline 04/10/2019: L hip flexion 3+/5; 07/21/2019: L Hip 4/5    Time 6    Period Weeks    Status Achieved      PT LONG TERM GOAL #7   Title Patient will be able to lift her affected L LE to the same extent as the R LE to allow for greater amount of foot clearance when ambulating    Baseline L LE: 10", R LE: 15"; 11/17/2019: L LE: 14", R LE 17.5"    Time 8    Period Weeks    Status New      PT LONG TERM GOAL #8   Title Patient will have full dorsiflexion with 5/5 strength to allow for greater toe clearance with the performance of exercises, most notably when walking.    Baseline L 4/5 5 degree ; R 5/5 12 degree    Time 8    Period Weeks    Status New                 Plan - 12/17/19 1901    Clinical Impression Statement Patient's hip flexion has improved, demonstrated by increase vertical excursion during exercises. Patient continues to have difficulty with walking longer distances due to fatigue of hip flexors of affected LE while out in community and for leisure activities. Patient also continues to have difficulty with narrow based ambulation with LOB episodes at which she was able to independently recover from. Patient will benefit from further skilled therapy focused on improving remaining limitations to return to prior level of function.    Personal Factors  and Comorbidities Past/Current Experience    Examination-Activity Limitations Locomotion Level  Examination-Participation Restrictions Community Activity    Stability/Clinical Decision Making Stable/Uncomplicated    Clinical Decision Making Low    Rehab Potential Good    Clinical Impairments Affecting Rehab Potential (-) hx of MS; (+) good motivation    PT Frequency 2x / week    PT Duration 4 weeks    PT Treatment/Interventions Moist Heat;Iontophoresis 4mg /ml Dexamethasone;Electrical Stimulation;Aquatic Therapy;Cryotherapy;Therapeutic activities;Neuromuscular re-education;Therapeutic exercise;Patient/family education;Dry needling;Manual techniques;Passive range of motion;Balance training;Functional mobility training;Gait training;Stair training;Joint Manipulations;Prosthetic Training    PT Next Visit Plan Dynamic balance and LE strength exercise    PT Home Exercise Plan hip flexion in sitting for strengthening, hip flexion knee flexion in standing for balance, bike intervals at 70%-75% max HR    Consulted and Agree with Plan of Care Patient           Patient will benefit from skilled therapeutic intervention in order to improve the following deficits and impairments:  Pain, Decreased coordination, Impaired perceived functional ability, Increased fascial restricitons, Increased muscle spasms, Difficulty walking, Abnormal gait, Decreased balance, Decreased mobility, Impaired sensation  Visit Diagnosis: Difficulty in walking, not elsewhere classified  Muscle weakness (generalized)  History of falling     Problem List There are no problems to display for this patient.  7:02 PM, 12/17/19 Margarito Liner, SPT Student Physical Therapist [place]  Margarito Liner 12/17/2019, 7:02 PM  [facility] [address] [place], [address] Phone: [phone]   Fax:  [phone]  Name: NEIDA ELLEGOOD MRN: [id number] Date of Birth: [birth year]

## 2019-12-24 ENCOUNTER — Ambulatory Visit: Payer: BC Managed Care – PPO

## 2019-12-24 ENCOUNTER — Other Ambulatory Visit: Payer: Self-pay

## 2019-12-24 DIAGNOSIS — M6281 Muscle weakness (generalized): Secondary | ICD-10-CM

## 2019-12-24 DIAGNOSIS — R262 Difficulty in walking, not elsewhere classified: Secondary | ICD-10-CM | POA: Diagnosis not present

## 2019-12-24 DIAGNOSIS — Z9181 History of falling: Secondary | ICD-10-CM

## 2019-12-24 NOTE — Therapy (Signed)
[facility] [address] [place], [address] Phone: [phone]   Fax:  [phone]  Physical Therapy Treatment  Patient Details  Name: Michelle Ruiz MRN: [id number] Date of Birth: 1970/10/10 No data recorded  Encounter Date: 12/24/2019   PT End of Session - 12/24/19 1817    Visit Number 106    Number of Visits 112    Date for PT Re-Evaluation 01/12/20    Authorization Type 6/10    PT Start Time 1815    PT Stop Time 1900    PT Time Calculation (min) 45 min    Equipment Utilized During Treatment Gait belt    Activity Tolerance Patient tolerated treatment well;No increased pain;Other (comment)    Behavior During Therapy [facility] for tasks assessed/performed;Anxious           Past Medical History:  Diagnosis Date  . Abnormal Pap smear of cervix   . Anxiety   . Kidney stones   . MS (multiple sclerosis) ([place])   . UTI (urinary tract infection)     Past Surgical History:  Procedure Laterality Date  . BREAST BIOPSY Right 2010   stereotactic biopsy/ neg/ dr.byrnett  . BREAST BIOPSY Left 2012   neg/dr. brynett  . CESAREAN SECTION  2008   RPH  . TONSILLECTOMY  2006    There were no vitals filed for this visit.   Subjective Assessment - 12/24/19 1816    Subjective Patient reports shes feeling good today.    Patient is accompained by: Family member    Pertinent History Patient reports history of falls x3 in the past 6 months secondary to drop foot on the L LE. PMH: Multiple sclerosis; elbow fracture of the R UE    Limitations Standing;Walking;Lifting    How long can you stand comfortably? 20 min    How long can you walk comfortably? 1 miles    Patient Stated Goals To be more balanced and core strengthening     Currently in Pain? No/denies    Pain Onset More than a month ago          INTERVENTIONS    Therapeutic Exercises   Leg Press at [place] 2x15 45lbs   SL Press at [place] 2x10 15lbs   Tandem  Stance on AirEx Beam x3  Lateral Walk on AirEx Beam x3   Hurdles Lateral Steps down/back x1  Hurdles down/back x2   Lunges with UE Support -- x8 B  Hip Flexion Machine (R side 40lbs and L side 25lbs) x15 bilat.   Ambulation x574ft  with 2#AW     Performed exercises to address LE weakness and balance difficulty   PT Education - 12/24/19 1817    Education Details form/technique with exercise    Person(s) Educated Patient    Methods Explanation;Demonstration    Comprehension Verbalized understanding;Returned demonstration               PT Long Term Goals - 11/17/19 1718      PT LONG TERM GOAL #1   Title Patient will be independent with balance HEP to continue benefits of therapy after discharge.    Baseline dependent with form/technique for balance exercise; 03/25/2018: Independent     Time 6    Period Weeks    Status Achieved      PT LONG TERM GOAL #2   Title Patient will improve FGA by 4 points to indicate significant improvement with balancing while ambulating and decrease of  fall risk    Baseline 03/25/2018: 28/30; 04/10/2019: 22; 07/21/2019: 29; 09/17/2019: 30/30    Time 6    Period Weeks    Status Achieved      PT LONG TERM GOAL #3   Title Patient will be able to perform prolonged single leg stance >10sec with eyes closed to indicate improvement with static balance and decrease fall risk    Baseline 2 sec in standing EC; 03/25/2018: 3 sec; 04/29/2018: 4 sec; 08/21/2018: 5 sec; 09/19/2018: 6 sec ; 10/21/2018 3 sec; 11/21/2018 5 sec; 01/08/2019: 9 sec; 02/25/2019: 14sec    Time 4    Period Weeks    Status Achieved      PT LONG TERM GOAL #4   Title Pt will improve 6 MWT >2045ft to demonstrate improved cardiorespiratory fitness    Baseline Deferred to next session: 04/29/2018; 05/01/2018: 1463ft; 08/21/2018: 1417ft; 09/19/2018: 1452ft; 10/21/2018 1621; 11/21/2018 deferred to next session 2/2 time.; 01/08/2019: 1480 ft; 02/25/2019: 1356ft; 04/10/2019: 1400 ft, 05/13/19: 1321 ft;  07/21/2019: 1170 ft; 09/17/2019: 1258ft; 11/17/2019: 1227ft    Time 4    Period Weeks    Status On-going      PT LONG TERM GOAL #5   Title Patient will be able to run 271ft without stopping to better be able to return to recreational activites and address cardiovascular endurance.    Baseline 35ft; 02/25/2019: 22ft; 04/10/2019 deferred    Time 4    Period Weeks    Status Deferred      Additional Long Term Goals   Additional Long Term Goals Yes      PT LONG TERM GOAL #6   Title Patient will improve L hip flexion strength to 4/5 for improved steppage gait and reduced fall risk.    Baseline 04/10/2019: L hip flexion 3+/5; 07/21/2019: L Hip 4/5    Time 6    Period Weeks    Status Achieved      PT LONG TERM GOAL #7   Title Patient will be able to lift her affected L LE to the same extent as the R LE to allow for greater amount of foot clearance when ambulating    Baseline L LE: 10", R LE: 15"; 11/17/2019: L LE: 14", R LE 17.5"    Time 8    Period Weeks    Status New      PT LONG TERM GOAL #8   Title Patient will have full dorsiflexion with 5/5 strength to allow for greater toe clearance with the performance of exercises, most notably when walking.    Baseline L 4/5 5 degree ; R 5/5 12 degree    Time 8    Period Weeks    Status New                 Plan - 12/24/19 1900    Clinical Impression Statement Patient's hip flexion remains limited but is improving overall; demonstrated by improved vertical excursion during walking.  However, fatigue decreases the patient's vertical excursion making walking more challenging. Patient will continue to benefit from skilled therapy to maintain progress made during therapy and return to prior level of function.    Personal Factors and Comorbidities Past/Current Experience    Examination-Activity Limitations Locomotion Level    Examination-Participation Restrictions Community Activity    Stability/Clinical Decision Making Stable/Uncomplicated     Clinical Decision Making Low    Rehab Potential Good    Clinical Impairments Affecting Rehab Potential (-) hx of MS; (+) good motivation  PT Frequency 2x / week    PT Duration 4 weeks    PT Treatment/Interventions Moist Heat;Iontophoresis 4mg /ml Dexamethasone;Electrical Stimulation;Aquatic Therapy;Cryotherapy;Therapeutic activities;Neuromuscular re-education;Therapeutic exercise;Patient/family education;Dry needling;Manual techniques;Passive range of motion;Balance training;Functional mobility training;Gait training;Stair training;Joint Manipulations;Prosthetic Training    PT Next Visit Plan Dynamic balance and LE strength exercise    PT Home Exercise Plan hip flexion in sitting for strengthening, hip flexion knee flexion in standing for balance, bike intervals at 70%-75% max HR    Consulted and Agree with Plan of Care Patient           Patient will benefit from skilled therapeutic intervention in order to improve the following deficits and impairments:  Pain, Decreased coordination, Impaired perceived functional ability, Increased fascial restricitons, Increased muscle spasms, Difficulty walking, Abnormal gait, Decreased balance, Decreased mobility, Impaired sensation  Visit Diagnosis: Difficulty in walking, not elsewhere classified  Muscle weakness (generalized)  History of falling     Problem List There are no problems to display for this patient.  7:01 PM, 12/24/19 Margarito Liner, SPT Student Physical Therapist [place]  Margarito Liner 12/24/2019, 7:00 PM  [facility] [address] [place], [address] Phone: [phone]   Fax:  [phone]  Name: Michelle Ruiz MRN: [id number] Date of Birth: [birth year]

## 2019-12-30 ENCOUNTER — Ambulatory Visit: Payer: BC Managed Care – PPO | Attending: Neurology

## 2019-12-30 ENCOUNTER — Other Ambulatory Visit: Payer: Self-pay

## 2019-12-30 DIAGNOSIS — Z9181 History of falling: Secondary | ICD-10-CM

## 2019-12-30 DIAGNOSIS — R262 Difficulty in walking, not elsewhere classified: Secondary | ICD-10-CM

## 2019-12-30 DIAGNOSIS — M6281 Muscle weakness (generalized): Secondary | ICD-10-CM

## 2019-12-30 NOTE — Therapy (Signed)
[place] [facility] [address] [place], [address] Phone: [phone]   Fax:  [phone]  Physical Therapy Treatment  Patient Details  Name: Michelle Ruiz MRN: [id number] Date of Birth: [birth year] No data recorded  Encounter Date: 12/30/2019   PT End of Session - 12/30/19 1704    Visit Number 107    Number of Visits 112    Date for PT Re-Evaluation 01/12/20    Authorization Type 7/10    PT Start Time 1702    PT Stop Time 1745    PT Time Calculation (min) 43 min    Equipment Utilized During Treatment Gait belt    Activity Tolerance Patient tolerated treatment well;No increased pain;Other (comment)    Behavior During Therapy [facility] for tasks assessed/performed;Anxious           Past Medical History:  Diagnosis Date   Abnormal Pap smear of cervix    Anxiety    Kidney stones    MS (multiple sclerosis) ([place])    UTI (urinary tract infection)     Past Surgical History:  Procedure Laterality Date   BREAST BIOPSY Right 2010   stereotactic biopsy/ neg/ dr.byrnett   BREAST BIOPSY Left 2012   neg/dr. brynett   CESAREAN SECTION  2008   [place]   TONSILLECTOMY  2006    There were no vitals filed for this visit.   Subjective Assessment - 12/30/19 1702    Subjective Patient reports that she had stiffness in her legs due to not walking.    Patient is accompained by: Family member    Pertinent History Patient reports history of falls x3 in the past 6 months secondary to drop foot on the L LE. PMH: Multiple sclerosis; elbow fracture of the R UE    Limitations Standing;Walking;Lifting    How long can you stand comfortably? 20 min    How long can you walk comfortably? 1 miles    Patient Stated Goals To be more balanced and core strengthening     Currently in Pain? No/denies    Pain Onset More than a month ago           INTERVENTIONS    Therapeutic Exercises  Total Gym Squats Level 25 2x12 Total Gym SL  Squats Level 17 1x8 bilat. Step-ups with 3lb AW on 10in Step 2x8 bilat  Tandem Stance Gait x173ft  Ambulation with hip flexion with 3lb ankle weights 2x12 B down and back  Hip Extension Machine (R side 40lbs and L side 25lbs) 2x12 bilat. Hip Abduction on Machine (R side 25lbs and L side 25lbs) x10 bilat. Toe Taps 18inch Step x12 B with 3# Lunges with UE Support x8 B   Performed Exercises to improve strength, endurance and balance      PT Education - 12/30/19 1703    Education Details Form/technique with exercise    Person(s) Educated Patient    Methods Explanation;Demonstration    Comprehension Verbalized understanding;Returned demonstration               PT Long Term Goals - 11/17/19 1718      PT LONG TERM GOAL #1   Title Patient will be independent with balance HEP to continue benefits of therapy after discharge.    Baseline dependent with form/technique for balance exercise; 03/25/2018: Independent     Time 6    Period Weeks    Status Achieved      PT LONG TERM GOAL #2  Title Patient will improve FGA by 4 points to indicate significant improvement with balancing while ambulating and decrease of fall risk    Baseline 03/25/2018: 28/30; 04/10/2019: 22; 07/21/2019: 29; 09/17/2019: 30/30    Time 6    Period Weeks    Status Achieved      PT LONG TERM GOAL #3   Title Patient will be able to perform prolonged single leg stance >10sec with eyes closed to indicate improvement with static balance and decrease fall risk    Baseline 2 sec in standing EC; 03/25/2018: 3 sec; 04/29/2018: 4 sec; 08/21/2018: 5 sec; 09/19/2018: 6 sec ; 10/21/2018 3 sec; 11/21/2018 5 sec; 01/08/2019: 9 sec; 02/25/2019: 14sec    Time 4    Period Weeks    Status Achieved      PT LONG TERM GOAL #4   Title Pt will improve 6 MWT >2074ft to demonstrate improved cardiorespiratory fitness    Baseline Deferred to next session: 04/29/2018; 05/01/2018: 1450ft; 08/21/2018: 142ft; 09/19/2018: 1458ft; 10/21/2018 1621;  11/21/2018 deferred to next session 2/2 time.; 01/08/2019: 1480 ft; 02/25/2019: 1386ft; 04/10/2019: 1400 ft, 05/13/19: 1321 ft; 07/21/2019: 1170 ft; 09/17/2019: 1233ft; 11/17/2019: 1260ft    Time 4    Period Weeks    Status On-going      PT LONG TERM GOAL #5   Title Patient will be able to run 249ft without stopping to better be able to return to recreational activites and address cardiovascular endurance.    Baseline 94ft; 02/25/2019: 63ft; 04/10/2019 deferred    Time 4    Period Weeks    Status Deferred      Additional Long Term Goals   Additional Long Term Goals Yes      PT LONG TERM GOAL #6   Title Patient will improve L hip flexion strength to 4/5 for improved steppage gait and reduced fall risk.    Baseline 04/10/2019: L hip flexion 3+/5; 07/21/2019: L Hip 4/5    Time 6    Period Weeks    Status Achieved      PT LONG TERM GOAL #7   Title Patient will be able to lift her affected L LE to the same extent as the R LE to allow for greater amount of foot clearance when ambulating    Baseline L LE: 10", R LE: 15"; 11/17/2019: L LE: 14", R LE 17.5"    Time 8    Period Weeks    Status New      PT LONG TERM GOAL #8   Title Patient will have full dorsiflexion with 5/5 strength to allow for greater toe clearance with the performance of exercises, most notably when walking.    Baseline L 4/5 5 degree ; R 5/5 12 degree    Time 8    Period Weeks    Status New                 Plan - 12/30/19 1705    Clinical Impression Statement Continued addressing patients gait, LE strength along with dynamic balance during session.  Patient is improving overall, demonstrated by less episodes of LOB during balance activities.Pt continues to make steady progress toward treatment goals and will benefit from skilled therapy to return to prior level of function.    Personal Factors and Comorbidities Past/Current Experience    Examination-Activity Limitations Locomotion Level    Examination-Participation  Restrictions Community Activity    Stability/Clinical Decision Making Stable/Uncomplicated    Clinical Decision Making Low    Rehab  Potential Good    Clinical Impairments Affecting Rehab Potential (-) hx of MS; (+) good motivation    PT Frequency 2x / week    PT Duration 4 weeks    PT Treatment/Interventions Moist Heat;Iontophoresis 4mg /ml Dexamethasone;Electrical Stimulation;Aquatic Therapy;Cryotherapy;Therapeutic activities;Neuromuscular re-education;Therapeutic exercise;Patient/family education;Dry needling;Manual techniques;Passive range of motion;Balance training;Functional mobility training;Gait training;Stair training;Joint Manipulations;Prosthetic Training    PT Next Visit Plan Dynamic balance and LE strength exercise    PT Home Exercise Plan hip flexion in sitting for strengthening, hip flexion knee flexion in standing for balance, bike intervals at 70%-75% max HR    Consulted and Agree with Plan of Care Patient           Patient will benefit from skilled therapeutic intervention in order to improve the following deficits and impairments:  Pain, Decreased coordination, Impaired perceived functional ability, Increased fascial restricitons, Increased muscle spasms, Difficulty walking, Abnormal gait, Decreased balance, Decreased mobility, Impaired sensation, Increased edema  Visit Diagnosis: Difficulty in walking, not elsewhere classified  Muscle weakness (generalized)  History of falling     Problem List There are no problems to display for this patient.  5:43 PM, 12/30/19 Margarito Liner, SPT Student Physical Therapist [place]  [phone]  Michelle Ruiz 12/30/2019, 5:38 PM  [place] [facility] [address], [place], [address] Phone: [phone]   Fax:  [phone]  Name: Michelle Ruiz MRN: [id number] Date of Birth: 1970/04/03

## 2020-01-01 ENCOUNTER — Ambulatory Visit: Payer: BC Managed Care – PPO

## 2020-01-06 ENCOUNTER — Ambulatory Visit: Payer: BC Managed Care – PPO

## 2020-01-09 IMAGING — MR MRI HEAD WITHOUT AND WITH CONTRAST
21 of 22 series · 46 of 48 positions shown · IV contrast (gadavist)
Comparison: 10/05/2016

CLINICAL DATA: Follow-up multiple sclerosis.  No new symptoms.

EXAM:
MRI HEAD WITHOUT AND WITH CONTRAST
MRI CERVICAL SPINE WITHOUT AND WITH CONTRAST
TECHNIQUE: Multiplanar, multiecho pulse sequences of the brain and surrounding
structures, and cervical spine, to include the craniocervical
junction and cervicothoracic junction, were obtained without and
with intravenous contrast.
CONTRAST:  7 mL Gadavist

[Series 5: ax dwi_tracew · axial · 3.0mm · 0.60mm/px · z∈[-66,+98]mm · 2 of 52 slices shown]
[im 1/52]
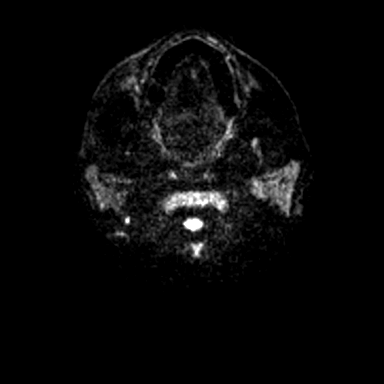
[im 52/52]
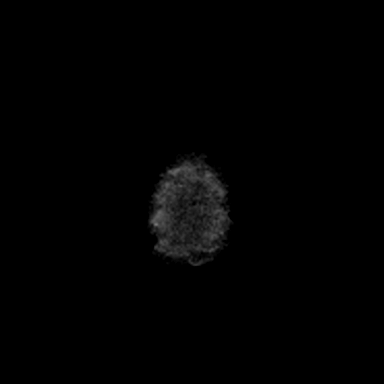

[Series 6: ax dwi_adc · axial · 3.0mm · 0.60mm/px · z∈[-66,+98]mm · 2 of 52 slices shown]
[im 1/52]
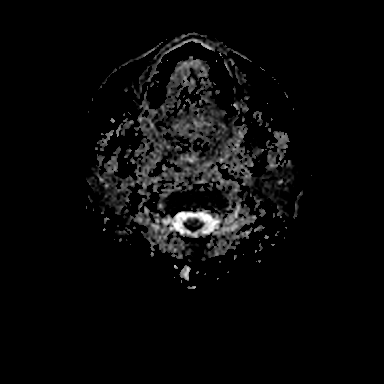
[im 52/52]
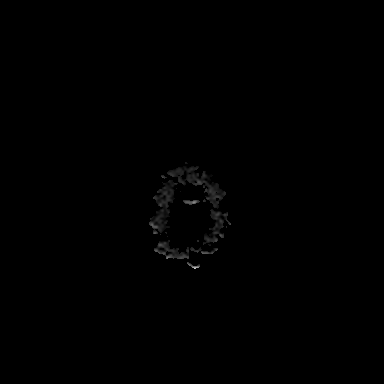

[Series 7: cor dwi_tracew · coronal · 5.0mm · 0.60mm/px · 2 of 38 slices shown]
[im 1/38]
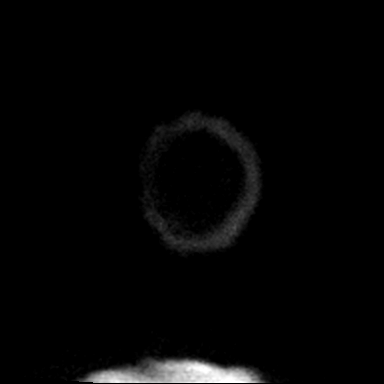
[im 38/38]
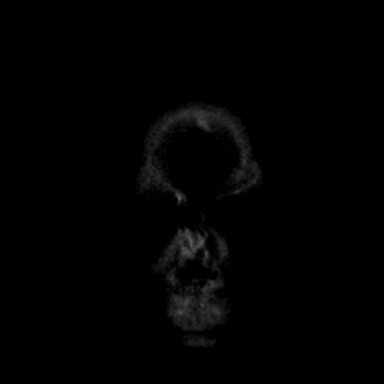

[Series 8: cor dwi_adc · coronal · 5.0mm · 0.60mm/px · 2 of 37 slices shown]
[im 1/37]
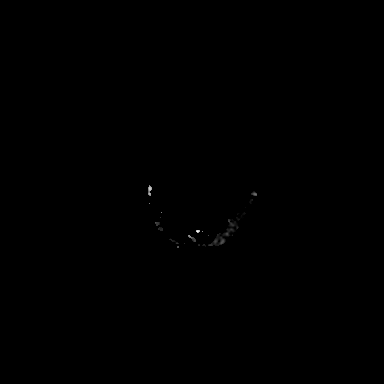
[im 37/37]
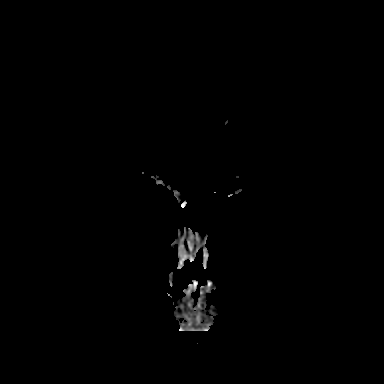

[Series 9: T1 · sagittal · 5.0mm · 0.62mm/px · 1 of 22 slices shown (1 of 3)]
[im 1/22]
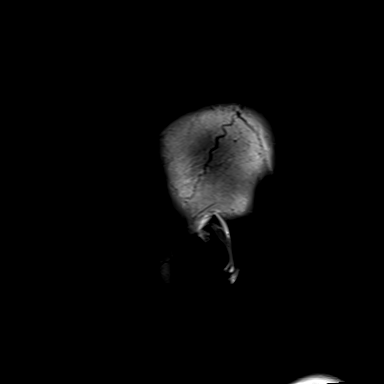

[Series 10: FLAIR · sagittal · 5.0mm · 0.94mm/px · 1 of 23 slices shown (1 of 3)]
[im 1/23]
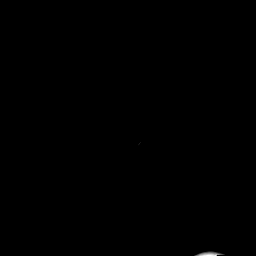

[Series 11: T2 · axial · 5.0mm · 0.53mm/px · 1 of 27 slices shown (1 of 3)]
[im 1/27]
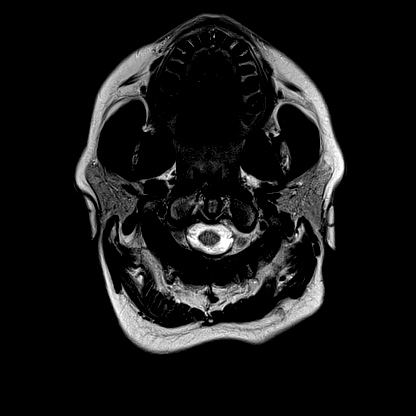

[Series 12: mag_images · axial · 3.0mm · 0.90mm/px · z∈[-70,+102]mm · 3 of 60 slices shown]
[im 1/60]
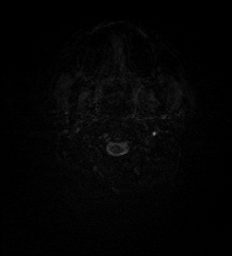
[im 30/60]
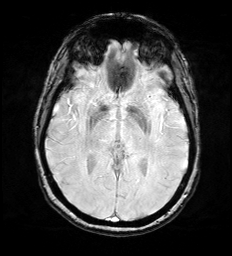
[im 60/60]
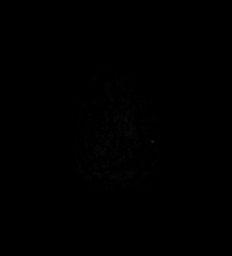

[Series 13: pha_images · axial · 3.0mm · 0.90mm/px · z∈[-70,+102]mm · 3 of 60 slices shown]
[im 1/60]
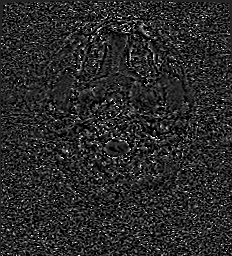
[im 30/60]
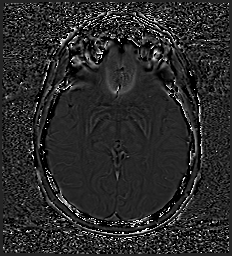
[im 60/60]
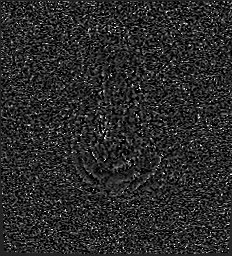

[Series 14: swi_images · axial · 3.0mm · 0.90mm/px · z∈[-70,+15]mm · 2 of 60 slices shown]
[im 1/60]
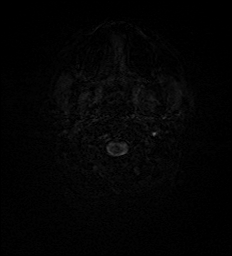
[im 30/60]
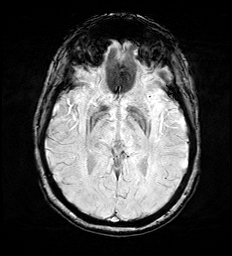

[Series 16: FLAIR · axial · 3.0mm · 0.53mm/px · z∈[-64,+94]mm · 3 of 55 slices shown (2 of 3)]
[im 1/55]
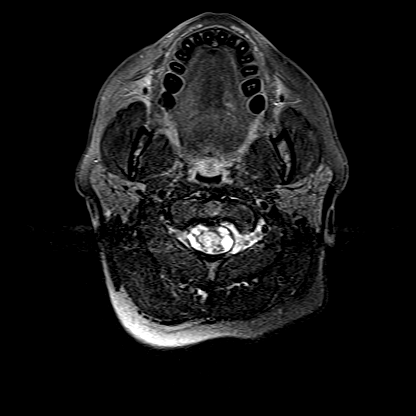
[im 28/55]
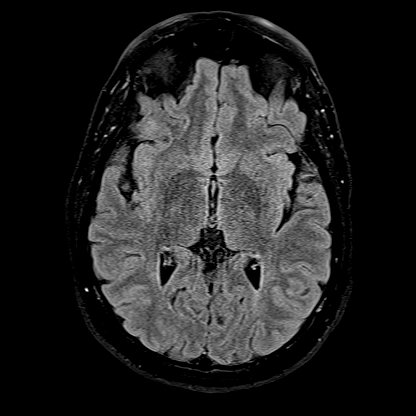
[im 55/55]
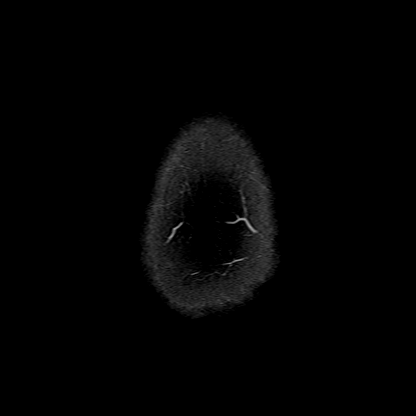

[Series 17: T1 · axial · 1.0mm · 0.98mm/px · z∈[-67,+103]mm · 8 of 176 slices shown (2 of 3)]
[im 1/176]
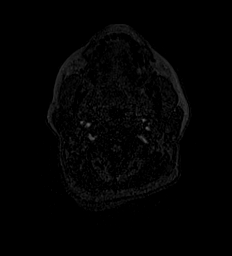
[im 26/176]
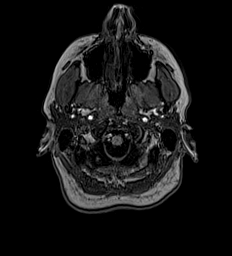
[im 51/176]
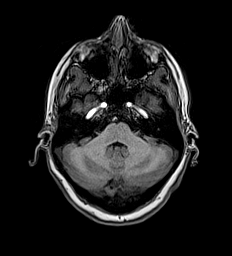
[im 76/176]
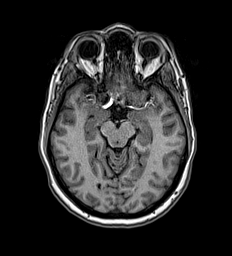
[im 101/176]
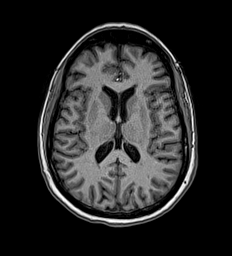
[im 126/176]
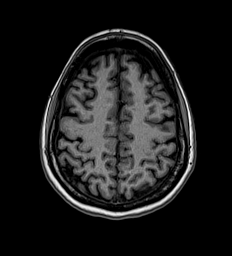
[im 151/176]
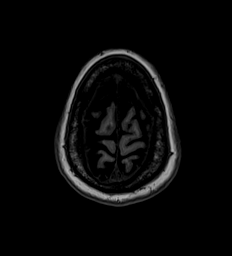
[im 176/176]
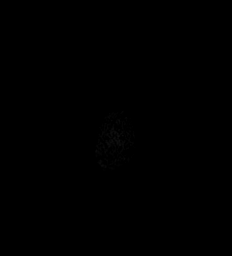

[Series 22: T2 · sagittal · 3.0mm · 0.62mm/px · 1 of 15 slices shown (2 of 3)]
[im 1/15]
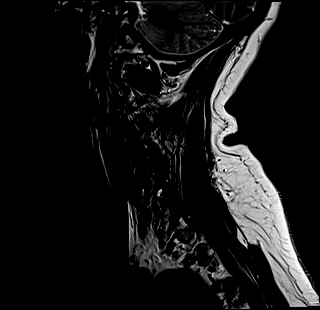

[Series 23: FLAIR · sagittal · 3.0mm · 0.78mm/px · 1 of 15 slices shown (3 of 3)]
[im 1/15]
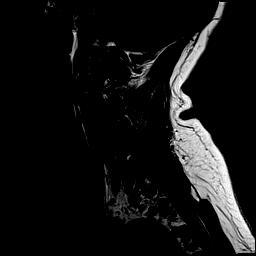

[Series 24: STIR · sagittal · 3.0mm · 0.62mm/px · 1 of 15 slices shown]
[im 1/15]
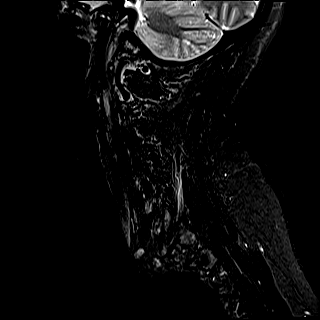

[Series 25: T2 · axial · 3.0mm · 0.70mm/px · 1 of 30 slices shown (3 of 3)]
[im 1/30]
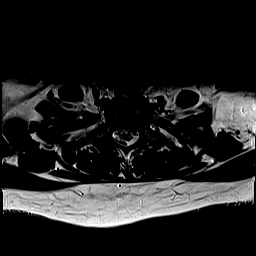

[Series 26: T1 · axial · non-contrast · 3.0mm · 0.35mm/px · 1 of 30 slices shown (3 of 3)]
[im 1/30]
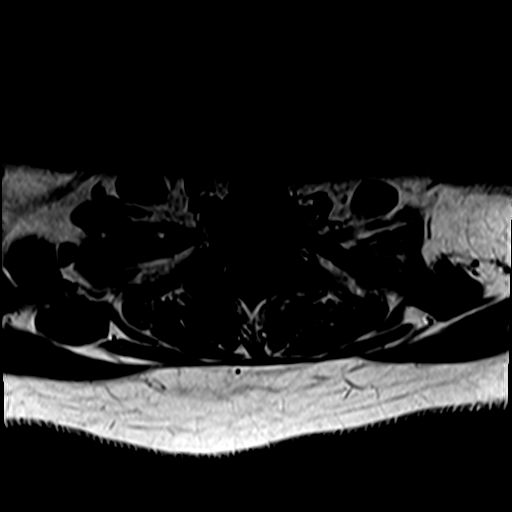

[Series 28: T2 post-contrast · coronal · 5.0mm · 0.57mm/px · 1 of 31 slices shown]
[im 1/31]
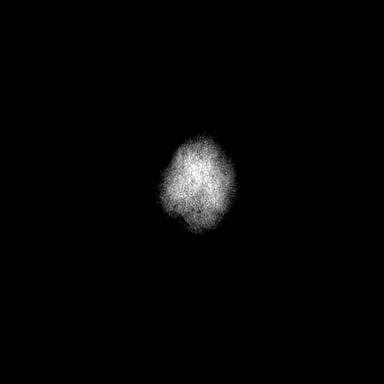

[Series 29: T1 post-contrast · axial · 1.0mm · 0.98mm/px · z∈[-67,+103]mm · 8 of 176 slices shown (1 of 2)]
[im 1/176]
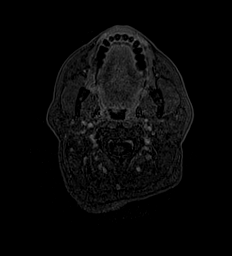
[im 26/176]
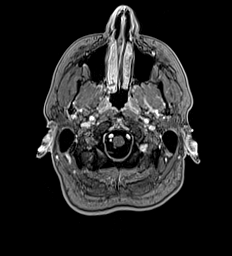
[im 51/176]
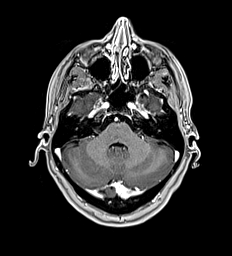
[im 76/176]
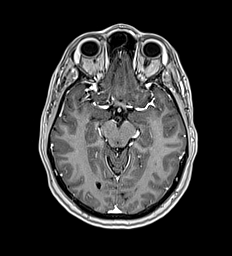
[im 101/176]
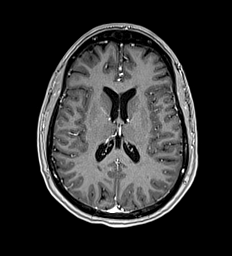
[im 126/176]
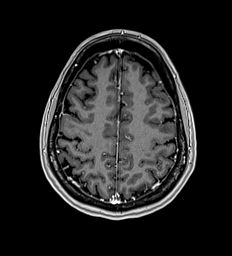
[im 151/176]
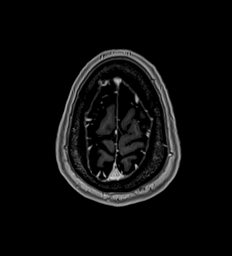
[im 176/176]
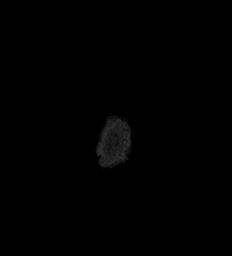

[Series 30: T1 post-contrast · coronal · 5.0mm · 0.57mm/px · 1 of 31 slices shown (2 of 2)]
[im 1/31]
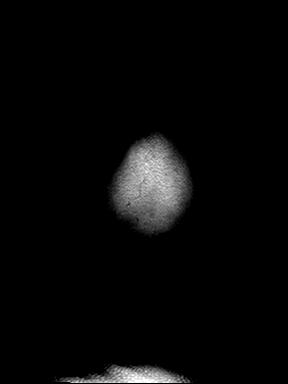

[Series 31: FLAIR fat-sat post-contrast · sagittal · 3.0mm · 0.78mm/px · 1 of 15 slices shown]
[im 1/15]
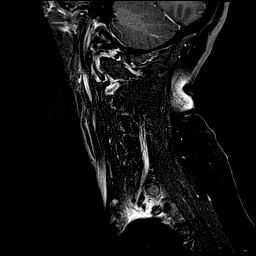

[46 of 48 positions shown; findings below may reference images not displayed]

FINDINGS: MRI HEAD FINDINGS

Brain: There is no evidence of acute infarct, intracranial
hemorrhage, mass, midline shift, or extra-axial fluid collection.
The ventricles and sulci are normal. Scattered small foci of T2
hyperintensity in the periventricular and deep cerebral white matter
bilaterally are unchanged. No new, enhancing, or diffusion
restricting lesions are identified. A small focus of T2
hyperintensity/gliosis adjacent to a developmental venous anomaly in
the right cerebellum is also unchanged.

Vascular: Major intracranial vascular flow voids are preserved.

Skull and upper cervical spine: Unremarkable bone marrow signal.

Sinuses/Orbits: Unremarkable orbits. Paranasal sinuses and mastoid
air cells are clear.

Other: None.

MRI CERVICAL SPINE FINDINGS

Alignment: Chronic cervical spine straightening.  No listhesis.

Vertebrae: No fracture, suspicious osseous lesion, or significant
marrow edema.

Cord: A small discrete focus of T2 hyperintensity in the dorsal
midline cord at C5 and more subtle, ill-defined T2 hyperintensity
elsewhere from approximately C3-C6 are unchanged. No new or
enhancing lesions are identified.

Posterior Fossa, vertebral arteries, paraspinal tissues:
Unremarkable.

Disc levels:

C2-3: Minimal uncovertebral spurring without stenosis, unchanged.

C3-4: Disc bulging and asymmetric left uncovertebral spurring result
in increased, mild-to-moderate left neural foraminal stenosis
without significant spinal stenosis.

C4-5: Minimal disc bulging without stenosis, unchanged.

C5-6: Negative.

C6-7: Minimal disc bulging without stenosis, unchanged.

C7-T1: Minimal disc bulging and minimal uncovertebral spurring
without stenosis, unchanged.
IMPRESSION: 1. Unchanged brain and cervical spinal cord lesions consistent with
the history of multiple sclerosis. No evidence of active
demyelination or other acute abnormality.
2. Mild cervical spondylosis with increased, mild-to-moderate left
neural foraminal stenosis at C3-4.

## 2020-01-13 ENCOUNTER — Ambulatory Visit: Payer: BC Managed Care – PPO

## 2020-01-13 ENCOUNTER — Other Ambulatory Visit: Payer: Self-pay

## 2020-01-13 DIAGNOSIS — M6281 Muscle weakness (generalized): Secondary | ICD-10-CM

## 2020-01-13 DIAGNOSIS — R262 Difficulty in walking, not elsewhere classified: Secondary | ICD-10-CM | POA: Diagnosis not present

## 2020-01-13 DIAGNOSIS — Z9181 History of falling: Secondary | ICD-10-CM

## 2020-01-13 NOTE — Therapy (Signed)
[place] [facility] [address] [place], [address] Phone: [phone]   Fax:  [phone]  Physical Therapy Treatment/Progress Note   Patient Details  Name: Michelle Ruiz MRN: [id number] Date of Birth: 1970/04/22 No data recorded  Encounter Date: 01/13/2020   PT End of Session - 01/13/20 1701    Visit Number 108    Number of Visits 112    Date for PT Re-Evaluation 01/12/20    Authorization Type 8/10    PT Start Time 1700    PT Stop Time 1745    PT Time Calculation (min) 45 min    Equipment Utilized During Treatment Gait belt    Activity Tolerance Patient tolerated treatment well;No increased pain;Other (comment)    Behavior During Therapy [facility] for tasks assessed/performed;Anxious           Past Medical History:  Diagnosis Date  . Abnormal Pap smear of cervix   . Anxiety   . Kidney stones   . MS (multiple sclerosis) ([place])   . UTI (urinary tract infection)     Past Surgical History:  Procedure Laterality Date  . BREAST BIOPSY Right 2010   stereotactic biopsy/ neg/ dr.byrnett  . BREAST BIOPSY Left 2012   neg/dr. brynett  . CESAREAN SECTION  2008   [facility]  . TONSILLECTOMY  2006    There were no vitals filed for this visit.   Subjective Assessment - 01/13/20 1700    Subjective Patient reports that everything is going well.    Patient is accompained by: Family member    Pertinent History Patient reports history of falls x3 in the past 6 months secondary to drop foot on the L LE. PMH: Multiple sclerosis; elbow fracture of the R UE    Limitations Standing;Walking;Lifting    How long can you stand comfortably? 20 min    How long can you walk comfortably? 1 miles    Patient Stated Goals To be more balanced and core strengthening     Currently in Pain? No/denies    Pain Onset More than a month ago           INTERVENTIONS    Therapeutic Exercises  6MWT: 1346ft Total Gym Squats Level 25 2x15 Total  Gym SL Squats Level 17 1x8 bilat. Toe Taps 18inch Step x12 B  Lunges with UE Support x8 B     Performed Exercises to improve strength, endurance and balance     PT Education - 01/13/20 1700    Education Details Form/Technique with Exercises    Person(s) Educated Patient    Methods Explanation;Demonstration    Comprehension Verbalized understanding;Returned demonstration               PT Long Term Goals - 01/13/20 1707      PT LONG TERM GOAL #1   Title Patient will be independent with balance HEP to continue benefits of therapy after discharge.    Baseline dependent with form/technique for balance exercise; 03/25/2018: Independent     Time 6    Period Weeks    Status Achieved      PT LONG TERM GOAL #2   Title Patient will improve FGA by 4 points to indicate significant improvement with balancing while ambulating and decrease of fall risk    Baseline 03/25/2018: 28/30; 04/10/2019: 22; 07/21/2019: 29; 09/17/2019: 30/30    Time 6    Period Weeks    Status Achieved      PT LONG  TERM GOAL #3   Title Patient will be able to perform prolonged single leg stance >10sec with eyes closed to indicate improvement with static balance and decrease fall risk    Baseline 2 sec in standing EC; 03/25/2018: 3 sec; 04/29/2018: 4 sec; 08/21/2018: 5 sec; 09/19/2018: 6 sec ; 10/21/2018 3 sec; 11/21/2018 5 sec; 01/08/2019: 9 sec; 02/25/2019: 14sec    Time 4    Period Weeks    Status Achieved      PT LONG TERM GOAL #4   Title Pt will improve 6 MWT >2080ft to demonstrate improved cardiorespiratory fitness    Baseline Deferred to next session: 04/29/2018; 05/01/2018: 1463ft; 08/21/2018: 1418ft; 09/19/2018: 1434ft; 10/21/2018 1621; 11/21/2018 deferred to next session 2/2 time.; 01/08/2019: 1480 ft; 02/25/2019: 1341ft; 04/10/2019: 1400 ft, 05/13/19: 1321 ft; 07/21/2019: 1170 ft; 09/17/2019: 1267ft; 11/17/2019: 1274ft; 01/13/20: 1353    Time 4    Period Weeks    Status On-going      PT LONG TERM GOAL #5   Title  Patient will be able to run 260ft without stopping to better be able to return to recreational activites and address cardiovascular endurance.    Baseline 20ft; 02/25/2019: 37ft; 04/10/2019 deferred    Time 4    Period Weeks    Status Deferred      PT LONG TERM GOAL #6   Title Patient will improve L hip flexion strength to 4/5 for improved steppage gait and reduced fall risk.    Baseline 04/10/2019: L hip flexion 3+/5; 07/21/2019: L Hip 4/5    Time 6    Period Weeks    Status Achieved      PT LONG TERM GOAL #7   Title Patient will be able to lift her affected L LE to the same extent as the R LE to allow for greater amount of foot clearance when ambulating    Baseline L LE: 10", R LE: 15"; 11/17/2019: L LE: 14", R LE 17.5"; 01/13/2020: L LE: 15.5", R LE 18"    Time 8    Period Weeks    Status On-going      PT LONG TERM GOAL #8   Title Patient will have full dorsiflexion with 5/5 strength to allow for greater toe clearance with the performance of exercises, most notably when walking.    Baseline L 4/5 5 degree ; R 5/5 12 degree; L 4+/5 5 degree ; R 5/5 12 degree    Time 8    Period Weeks    Status On-going                 Plan - 01/13/20 1701    Clinical Impression Statement Patient is making progress which was demonstrated by increasing her 6MWT distance by 103 ft showing an increase in aerobic capacity/endurance, increased hip excursion by 1.5 inches and improving DF strength to 4+/5 showing improvement in foot clearance and gait pattern.  However, her L LE hip excursion remains less than contralateral side along with DF MMT.  Patient tolerated exercises following recert well with moderate fatigue at end of session.  Patient will continue to benefit from skilled therapy to return to prior level of function and meet remaining goals.    Personal Factors and Comorbidities Past/Current Experience    Examination-Activity Limitations Locomotion Level    Examination-Participation  Restrictions Community Activity    Stability/Clinical Decision Making Stable/Uncomplicated    Clinical Decision Making Low    Rehab Potential Good    Clinical Impairments Affecting Rehab  Potential (-) hx of MS; (+) good motivation    PT Frequency 2x / week    PT Duration 4 weeks    PT Treatment/Interventions Moist Heat;Iontophoresis 4mg /ml Dexamethasone;Electrical Stimulation;Aquatic Therapy;Cryotherapy;Therapeutic activities;Neuromuscular re-education;Therapeutic exercise;Patient/family education;Dry needling;Manual techniques;Passive range of motion;Balance training;Functional mobility training;Gait training;Stair training;Joint Manipulations;Prosthetic Training    PT Next Visit Plan Dynamic balance and LE strength exercise    PT Home Exercise Plan hip flexion in sitting for strengthening, hip flexion knee flexion in standing for balance, bike intervals at 70%-75% max HR    Consulted and Agree with Plan of Care Patient           Patient will benefit from skilled therapeutic intervention in order to improve the following deficits and impairments:  Pain, Decreased coordination, Impaired perceived functional ability, Increased fascial restricitons, Increased muscle spasms, Difficulty walking, Abnormal gait, Decreased balance, Decreased mobility, Impaired sensation, Increased edema  Visit Diagnosis: Difficulty in walking, not elsewhere classified  Muscle weakness (generalized)  History of falling     Problem List There are no problems to display for this patient.  5:41 PM, 01/13/20 Margarito Liner, SPT Student Physical Therapist [place]  [phone]  Shardae Kleinman 01/13/2020, 5:35 PM  [place] [facility] [address] [place], [address] Phone: [phone]   Fax:  [phone]  Name: Michelle Ruiz MRN: [id number] Date of Birth: 1971-03-21

## 2020-01-20 ENCOUNTER — Ambulatory Visit: Payer: BC Managed Care – PPO

## 2020-01-20 ENCOUNTER — Other Ambulatory Visit: Payer: Self-pay

## 2020-01-20 DIAGNOSIS — Z9181 History of falling: Secondary | ICD-10-CM

## 2020-01-20 DIAGNOSIS — M6281 Muscle weakness (generalized): Secondary | ICD-10-CM

## 2020-01-20 DIAGNOSIS — R262 Difficulty in walking, not elsewhere classified: Secondary | ICD-10-CM | POA: Diagnosis not present

## 2020-01-20 NOTE — Therapy (Signed)
[facility] [address] [place], [address] Phone: [phone]   Fax:  [phone]  Physical Therapy Treatment  Patient Details  Name: Michelle Ruiz MRN: [id number] Date of Birth: 04-11-1970 No data recorded  Encounter Date: 01/20/2020   PT End of Session - 01/20/20 1706    Visit Number 109    Number of Visits 112    Date for PT Re-Evaluation 01/12/20    Authorization Type 9/10    PT Start Time 1700    PT Stop Time 1745    PT Time Calculation (min) 45 min    Equipment Utilized During Treatment Gait belt    Activity Tolerance Patient tolerated treatment well;No increased pain;Other (comment)    Behavior During Therapy [facility] for tasks assessed/performed;Anxious           Past Medical History:  Diagnosis Date  . Abnormal Pap smear of cervix   . Anxiety   . Kidney stones   . MS (multiple sclerosis) ([place])   . UTI (urinary tract infection)     Past Surgical History:  Procedure Laterality Date  . BREAST BIOPSY Right 2010   stereotactic biopsy/ neg/ dr.byrnett  . BREAST BIOPSY Left 2012   neg/dr. brynett  . CESAREAN SECTION  2008   RPH  . TONSILLECTOMY  2006    There were no vitals filed for this visit.   Subjective Assessment - 01/20/20 1702    Subjective Patient reports that she has no issues to report.    Pertinent History Patient reports history of falls x3 in the past 6 months secondary to drop foot on the L LE. PMH: Multiple sclerosis; elbow fracture of the R UE    Limitations Standing;Walking;Lifting    How long can you stand comfortably? 20 min    How long can you walk comfortably? 1 miles    Patient Stated Goals To be more balanced and core strengthening     Currently in Pain? No/denies    Pain Onset More than a month ago            INTERVENTIONS    Therapeutic Exercises  Total Gym Squats Level 25 2x12 Total Gym SL Squats Level 19 1x8 bilat. Leg Extension at [employer]  - Bilat  x15 20#  - SL x8 B 10# Hip Flexion on Machine (R side 40lbs and L side 25lbs) 2x12 bilat. Hip Abduction on Machine (R side 25lbs and L side 25lbs) x10 bilat. Ambulation with hip flexion  x263ft  Sidestep on AirEx Beam with Ball Toss     Performed Exercises to improve strength, endurance and balance      PT Education - 01/20/20 1705    Education Details Form/Technique with Exercises    Person(s) Educated Patient    Methods Explanation;Demonstration    Comprehension Verbalized understanding;Returned demonstration               PT Long Term Goals - 01/13/20 1707      PT LONG TERM GOAL #1   Title Patient will be independent with balance HEP to continue benefits of therapy after discharge.    Baseline dependent with form/technique for balance exercise; 03/25/2018: Independent     Time 6    Period Weeks    Status Achieved      PT LONG TERM GOAL #2   Title Patient will improve FGA by 4 points to indicate significant improvement with balancing while ambulating and decrease of fall risk  Baseline 03/25/2018: 28/30; 04/10/2019: 22; 07/21/2019: 29; 09/17/2019: 30/30    Time 6    Period Weeks    Status Achieved      PT LONG TERM GOAL #3   Title Patient will be able to perform prolonged single leg stance >10sec with eyes closed to indicate improvement with static balance and decrease fall risk    Baseline 2 sec in standing EC; 03/25/2018: 3 sec; 04/29/2018: 4 sec; 08/21/2018: 5 sec; 09/19/2018: 6 sec ; 10/21/2018 3 sec; 11/21/2018 5 sec; 01/08/2019: 9 sec; 02/25/2019: 14sec    Time 4    Period Weeks    Status Achieved      PT LONG TERM GOAL #4   Title Pt will improve 6 MWT >2055ft to demonstrate improved cardiorespiratory fitness    Baseline Deferred to next session: 04/29/2018; 05/01/2018: 1488ft; 08/21/2018: 1432ft; 09/19/2018: 1463ft; 10/21/2018 1621; 11/21/2018 deferred to next session 2/2 time.; 01/08/2019: 1480 ft; 02/25/2019: 1321ft; 04/10/2019: 1400 ft, 05/13/19: 1321 ft; 07/21/2019:  1170 ft; 09/17/2019: 1213ft; 11/17/2019: 1227ft; 01/13/20: 1353    Time 4    Period Weeks    Status On-going      PT LONG TERM GOAL #5   Title Patient will be able to run 265ft without stopping to better be able to return to recreational activites and address cardiovascular endurance.    Baseline 80ft; 02/25/2019: 67ft; 04/10/2019 deferred    Time 4    Period Weeks    Status Deferred      PT LONG TERM GOAL #6   Title Patient will improve L hip flexion strength to 4/5 for improved steppage gait and reduced fall risk.    Baseline 04/10/2019: L hip flexion 3+/5; 07/21/2019: L Hip 4/5    Time 6    Period Weeks    Status Achieved      PT LONG TERM GOAL #7   Title Patient will be able to lift her affected L LE to the same extent as the R LE to allow for greater amount of foot clearance when ambulating    Baseline L LE: 10", R LE: 15"; 11/17/2019: L LE: 14", R LE 17.5"; 01/13/2020: L LE: 15.5", R LE 18"    Time 8    Period Weeks    Status On-going      PT LONG TERM GOAL #8   Title Patient will have full dorsiflexion with 5/5 strength to allow for greater toe clearance with the performance of exercises, most notably when walking.    Baseline L 4/5 5 degree ; R 5/5 12 degree; L 4+/5 5 degree ; R 5/5 12 degree    Time 8    Period Weeks    Status On-going                 Plan - 01/20/20 1707    Clinical Impression Statement Focused on improving patients LE strength and endurance to improve patients gait pattern. Patient's hip flexion excursion and DF of L LE continues to show improvement demonstrated by no toe drag during gait; however, remains limited to contralateral LE. Patient's muscular endurance remains limited with moderate fatigue at end of session.  Patient will continue to benefit from skilled therapy to address limitations and return to prior level of function.    Personal Factors and Comorbidities Past/Current Experience    Examination-Activity Limitations Locomotion Level     Examination-Participation Restrictions Community Activity    Stability/Clinical Decision Making Stable/Uncomplicated    Clinical Decision Making Low  Rehab Potential Good    Clinical Impairments Affecting Rehab Potential (-) hx of MS; (+) good motivation    PT Frequency 2x / week    PT Duration 4 weeks    PT Treatment/Interventions Moist Heat;Iontophoresis 4mg /ml Dexamethasone;Electrical Stimulation;Aquatic Therapy;Cryotherapy;Therapeutic activities;Neuromuscular re-education;Therapeutic exercise;Patient/family education;Dry needling;Manual techniques;Passive range of motion;Balance training;Functional mobility training;Gait training;Stair training;Joint Manipulations;Prosthetic Training    PT Next Visit Plan Dynamic balance and LE strength exercise    PT Home Exercise Plan hip flexion in sitting for strengthening, hip flexion knee flexion in standing for balance, bike intervals at 70%-75% max HR    Consulted and Agree with Plan of Care Patient           Patient will benefit from skilled therapeutic intervention in order to improve the following deficits and impairments:  Pain, Decreased coordination, Impaired perceived functional ability, Increased fascial restricitons, Increased muscle spasms, Difficulty walking, Abnormal gait, Decreased balance, Decreased mobility, Impaired sensation, Increased edema  Visit Diagnosis: Difficulty in walking, not elsewhere classified  Muscle weakness (generalized)  History of falling     Problem List There are no problems to display for this patient.  5:47 PM, 01/20/20 Margarito Liner, SPT Student Physical Therapist [place]  [phone]  Gerardine Peltz 01/20/2020, 5:47 PM  [facility] [address] [place], [address] Phone: [phone]   Fax:  [phone]  Name: CHESNIE CAPELL MRN: [id number] Date of Birth: 1970-06-29

## 2020-01-27 ENCOUNTER — Other Ambulatory Visit: Payer: Self-pay

## 2020-01-27 ENCOUNTER — Ambulatory Visit: Payer: BC Managed Care – PPO | Attending: Neurology

## 2020-01-27 DIAGNOSIS — M6281 Muscle weakness (generalized): Secondary | ICD-10-CM | POA: Insufficient documentation

## 2020-01-27 DIAGNOSIS — Z9181 History of falling: Secondary | ICD-10-CM | POA: Diagnosis present

## 2020-01-27 DIAGNOSIS — R262 Difficulty in walking, not elsewhere classified: Secondary | ICD-10-CM | POA: Diagnosis present

## 2020-01-28 ENCOUNTER — Ambulatory Visit: Payer: BC Managed Care – PPO

## 2020-01-28 NOTE — Therapy (Signed)
[facility] [address] [place], [address] Phone: [phone]   Fax:  [phone]  Physical Therapy Treatment  Patient Details  Name: Michelle Ruiz MRN: [id number] Date of Birth: June 19, 1970 No data recorded  Encounter Date: 01/27/2020   PT End of Session - 01/27/20 1723    Visit Number 110    Number of Visits 112    Date for PT Re-Evaluation 03/09/20    Authorization Type 3/10    PT Start Time 0737    PT Stop Time 1800    PT Time Calculation (min) 45 min    Equipment Utilized During Treatment Gait belt    Activity Tolerance Patient tolerated treatment well;No increased pain;Other (comment)    Behavior During Therapy [facility] for tasks assessed/performed;Anxious           Past Medical History:  Diagnosis Date  . Abnormal Pap smear of cervix   . Anxiety   . Kidney stones   . MS (multiple sclerosis) ([place])   . UTI (urinary tract infection)     Past Surgical History:  Procedure Laterality Date  . BREAST BIOPSY Right 2010   stereotactic biopsy/ neg/ dr.byrnett  . BREAST BIOPSY Left 2012   neg/dr. brynett  . CESAREAN SECTION  2008   RPH  . TONSILLECTOMY  2006    There were no vitals filed for this visit.   Subjective Assessment - 01/27/20 1721    Subjective Patient reports no major changes since the previous session and has been performing walking around his house.    Pertinent History Patient reports history of falls x3 in the past 6 months secondary to drop foot on the L LE. PMH: Multiple sclerosis; elbow fracture of the R UE    Limitations Standing;Walking;Lifting    How long can you stand comfortably? 20 min    How long can you walk comfortably? 1 miles    Patient Stated Goals To be more balanced and core strengthening     Currently in Pain? No/denies    Pain Onset More than a month ago                INTERVENTIONS    Therapeutic Exercises  TRX Squats - 2 x 10 Seated Hip flexion  with 10# on each leg - 2 x 15 Leg Extension at [employer]   Bilat x15 35#  Total Gym Squats Level 26 2x12 Total Gym SL Squats Level 19 x8 on LLE Hip Abduction on Machine (R side 40lbs and L side 40lbs) x8 bilat. Hip Flexion on Machine (R side 40lbs and L side 40lbs) x10 bilat. Ambulation with intermittent Hip flexion and knee flexion with 3# ankle weights - 74ft Performed exercises to address LE strength limitations and decrease fall risk.       PT Education - 01/27/20 1723    Education Details form/technique with exercise    Person(s) Educated Patient    Methods Explanation;Demonstration    Comprehension Verbalized understanding;Returned demonstration;Verbal cues required;Tactile cues required               PT Long Term Goals - 01/13/20 1707      PT LONG TERM GOAL #1   Title Patient will be independent with balance HEP to continue benefits of therapy after discharge.    Baseline dependent with form/technique for balance exercise; 03/25/2018: Independent     Time 6    Period Weeks    Status Achieved  PT LONG TERM GOAL #2   Title Patient will improve FGA by 4 points to indicate significant improvement with balancing while ambulating and decrease of fall risk    Baseline 03/25/2018: 28/30; 04/10/2019: 22; 07/21/2019: 29; 09/17/2019: 30/30    Time 6    Period Weeks    Status Achieved      PT LONG TERM GOAL #3   Title Patient will be able to perform prolonged single leg stance >10sec with eyes closed to indicate improvement with static balance and decrease fall risk    Baseline 2 sec in standing EC; 03/25/2018: 3 sec; 04/29/2018: 4 sec; 08/21/2018: 5 sec; 09/19/2018: 6 sec ; 10/21/2018 3 sec; 11/21/2018 5 sec; 01/08/2019: 9 sec; 02/25/2019: 14sec    Time 4    Period Weeks    Status Achieved      PT LONG TERM GOAL #4   Title Pt will improve 6 MWT >2030ft to demonstrate improved cardiorespiratory fitness    Baseline Deferred to next session: 04/29/2018; 05/01/2018: 1426ft; 08/21/2018:  1463ft; 09/19/2018: 1437ft; 10/21/2018 1621; 11/21/2018 deferred to next session 2/2 time.; 01/08/2019: 1480 ft; 02/25/2019: 1366ft; 04/10/2019: 1400 ft, 05/13/19: 1321 ft; 07/21/2019: 1170 ft; 09/17/2019: 1264ft; 11/17/2019: 1216ft; 01/13/20: 1353    Time 4    Period Weeks    Status On-going      PT LONG TERM GOAL #5   Title Patient will be able to run 222ft without stopping to better be able to return to recreational activites and address cardiovascular endurance.    Baseline 64ft; 02/25/2019: 45ft; 04/10/2019 deferred    Time 4    Period Weeks    Status Deferred      PT LONG TERM GOAL #6   Title Patient will improve L hip flexion strength to 4/5 for improved steppage gait and reduced fall risk.    Baseline 04/10/2019: L hip flexion 3+/5; 07/21/2019: L Hip 4/5    Time 6    Period Weeks    Status Achieved      PT LONG TERM GOAL #7   Title Patient will be able to lift her affected L LE to the same extent as the R LE to allow for greater amount of foot clearance when ambulating    Baseline L LE: 10", R LE: 15"; 11/17/2019: L LE: 14", R LE 17.5"; 01/13/2020: L LE: 15.5", R LE 18"    Time 8    Period Weeks    Status On-going      PT LONG TERM GOAL #8   Title Patient will have full dorsiflexion with 5/5 strength to allow for greater toe clearance with the performance of exercises, most notably when walking.    Baseline L 4/5 5 degree ; R 5/5 12 degree; L 4+/5 5 degree ; R 5/5 12 degree    Time 8    Period Weeks    Status On-going                 Plan - 01/28/20 0902    Clinical Impression Statement Improvement with hip strength and motor control during today's session with patient able to perform hip flexion with greater amount of resistance compared to previous sessions indicating functional carryover between sessions. Although patient is improving, she continues to have increased difficulty with resistance based exercises in terms of fatigue. Patient will benefit from further skilled  therapy focused on improving these limitations to return to prior level of function.    Personal Factors and Comorbidities Past/Current Experience  Examination-Activity Limitations Locomotion Level    Examination-Participation Restrictions Community Activity    Stability/Clinical Decision Making Stable/Uncomplicated    Rehab Potential Good    Clinical Impairments Affecting Rehab Potential (-) hx of MS; (+) good motivation    PT Frequency 2x / week    PT Duration 4 weeks    PT Treatment/Interventions Moist Heat;Iontophoresis 4mg /ml Dexamethasone;Electrical Stimulation;Aquatic Therapy;Cryotherapy;Therapeutic activities;Neuromuscular re-education;Therapeutic exercise;Patient/family education;Dry needling;Manual techniques;Passive range of motion;Balance training;Functional mobility training;Gait training;Stair training;Joint Manipulations;Prosthetic Training    PT Next Visit Plan Dynamic balance and LE strength exercise    PT Home Exercise Plan hip flexion in sitting for strengthening, hip flexion knee flexion in standing for balance, bike intervals at 70%-75% max HR    Consulted and Agree with Plan of Care Patient           Patient will benefit from skilled therapeutic intervention in order to improve the following deficits and impairments:  Pain, Decreased coordination, Impaired perceived functional ability, Increased fascial restricitons, Increased muscle spasms, Difficulty walking, Abnormal gait, Decreased balance, Decreased mobility, Impaired sensation, Increased edema  Visit Diagnosis: Difficulty in walking, not elsewhere classified  Muscle weakness (generalized)  History of falling     Problem List There are no problems to display for this patient.   [name], PT DPT 01/28/2020, 9:05 AM  [facility] [address], [place], [address] Phone: [phone]   Fax:  [phone]  Name: Michelle Ruiz MRN: [id number] Date of Birth: 1971-03-06

## 2020-02-03 ENCOUNTER — Encounter: Payer: Self-pay | Admitting: Dermatology

## 2020-02-03 ENCOUNTER — Ambulatory Visit: Payer: BC Managed Care – PPO | Admitting: Dermatology

## 2020-02-03 ENCOUNTER — Other Ambulatory Visit: Payer: Self-pay

## 2020-02-03 DIAGNOSIS — Z85828 Personal history of other malignant neoplasm of skin: Secondary | ICD-10-CM

## 2020-02-03 DIAGNOSIS — L578 Other skin changes due to chronic exposure to nonionizing radiation: Secondary | ICD-10-CM

## 2020-02-03 DIAGNOSIS — L821 Other seborrheic keratosis: Secondary | ICD-10-CM | POA: Diagnosis not present

## 2020-02-03 DIAGNOSIS — D229 Melanocytic nevi, unspecified: Secondary | ICD-10-CM

## 2020-02-03 DIAGNOSIS — L814 Other melanin hyperpigmentation: Secondary | ICD-10-CM

## 2020-02-03 DIAGNOSIS — Z1283 Encounter for screening for malignant neoplasm of skin: Secondary | ICD-10-CM | POA: Diagnosis not present

## 2020-02-03 DIAGNOSIS — L820 Inflamed seborrheic keratosis: Secondary | ICD-10-CM

## 2020-02-03 DIAGNOSIS — L570 Actinic keratosis: Secondary | ICD-10-CM

## 2020-02-03 DIAGNOSIS — D1800 Hemangioma unspecified site: Secondary | ICD-10-CM

## 2020-02-03 DIAGNOSIS — L738 Other specified follicular disorders: Secondary | ICD-10-CM

## 2020-02-03 NOTE — Patient Instructions (Addendum)
Cryotherapy Aftercare  . Wash gently with soap and water everyday.   Marland Kitchen Apply Vaseline and Band-Aid daily until healed.   Discussed cosmetic procedure, noncovered.  $60 for 1st lesion and $15 for each additional lesion if done on the same day.  Maximum charge $350.  One touch-up treatment included no charge. Discussed risks of treatment including dyspigmentation, small scar, and/or recurrence.

## 2020-02-03 NOTE — Progress Notes (Signed)
Follow-Up Visit   Subjective  Michelle Ruiz is a 49 y.o. female who presents for the following: TBSE (Hx multiple BCCs) and Spot (chest x months).  Raised and scaly, itchy at times.  Pink scaly spots on hands that don't clear up.   The following portions of the chart were reviewed this encounter and updated as appropriate:      Review of Systems:  No other skin or systemic complaints except as noted in HPI or Assessment and Plan.  Objective  Well appearing patient in no apparent distress; mood and affect are within normal limits.  A full examination was performed including scalp, head, eyes, ears, nose, lips, neck, chest, axillae, abdomen, back, buttocks, bilateral upper extremities, bilateral lower extremities, hands, feet, fingers, toes, fingernails, and toenails. All findings within normal limits unless otherwise noted below.  Objective  Right Mid Forearm; multiple - see history: Well healed scar with no evidence of recurrence.   Objective  Lower sternum x 1: Erythematous keratotic or waxy stuck-on papule   Objective  Lower legs, arms, R medial calf x 1: Waxy, tan-brown macules -- Discussed benign etiology and prognosis.   Images    Objective  Right Hand Dorsum x 1, Left Hand Dorsum x 1 (2): Pink scaly macules   Assessment & Plan    Skin cancer screening performed today.  Actinic Damage - Severe, chronic, secondary to cumulative UV radiation exposure over time face, chest - diffuse scaly erythematous macules and papules with underlying dyspigmentation - Discussed "Field Treatment" for Severe, Confluent Actinic Changes with Pre-Cancerous Actinic Keratoses Field treatment involves treatment of an entire area of skin that has confluent Actinic Changes (Sun/ Ultraviolet light damage) and PreCancerous Actinic Keratoses by method of PhotoDynamic Therapy (PDT) and/or prescription Topical Chemotherapy agents such as 5-fluorouracil, 5-fluorouracil/calcipotriene, and/or  imiquimod.  The purpose is to decrease the number of clinically evident and subclinical PreCancerous lesions to prevent progression to development of skin cancer by chemically destroying early precancer changes that may or may not be visible.  It has been shown to reduce the risk of developing skin cancer in the treated area. As a result of treatment, redness, scaling, crusting, and open sores may occur during treatment course. One or more than one of these methods may be used and may have to be used several times to control, suppress and eliminate the PreCancerous changes. Discussed treatment course, expected reaction, and possible side effects. - Recommend daily broad spectrum sunscreen SPF 30+ to sun-exposed areas, reapply every 2 hours as needed.  - Call for new or changing lesions. - Pt will schedule for PDT face x 2 and PDT chest x 2 treatments  Seborrheic Keratoses - Stuck-on, waxy, tan-brown papules and plaques  - Discussed benign etiology and prognosis. - Observe - Call for any changes  Lentigines - Scattered tan macules - Discussed due to sun exposure - Benign, observe - Call for any changes  Hemangiomas - Red papules - Discussed benign nature - Observe - Call for any changes  Melanocytic Nevi - Tan-brown and/or pink-flesh-colored symmetric macules and papules - Benign appearing on exam today - Observation - Call clinic for new or changing moles - Recommend daily use of broad spectrum spf 30+ sunscreen to sun-exposed areas.   Sebaceous Hyperplasia - Small yellow papules with a central dell - Benign - Observe - Discussed cosmetic treatment with electrodessication. Discussed cosmetic procedure, noncovered.  $60 for 1st lesion and $15 for each additional lesion if done on the same day.  Maximum charge $350.  One touch-up treatment included no charge. Discussed risks of treatment including dyspigmentation, small scar, and/or recurrence.  History of basal cell carcinoma  (BCC) Right Mid Forearm; multiple - see history  Clear. Observe for recurrence. Call clinic for new or changing lesions.  Recommend regular skin exams, daily broad-spectrum spf 30+ sunscreen use, and photoprotection.     Inflamed seborrheic keratosis Lower sternum x 1  Destruction of lesion - Lower sternum x 1  Destruction method: cryotherapy   Informed consent: discussed and consent obtained   Lesion destroyed using liquid nitrogen: Yes   Region frozen until ice ball extended beyond lesion: Yes   Outcome: patient tolerated procedure well with no complications   Post-procedure details: wound care instructions given    Seborrheic keratosis (2) Lower legs, arms; R medial calf x 1  Discussed cosmetic procedure, noncovered.  $60 for 1st lesion and $15 for each additional lesion if done on the same day.  Maximum charge $350.  One touch-up treatment included no charge. Discussed risks of treatment including dyspigmentation, small scar, and/or recurrence.   Discussed starting with one spot on leg to see how she heals after LN2 treatment. Treated SK R medial calf- $60 cosmetic Ln2 today  Destruction of lesion - R medial calf x 1  Destruction method: cryotherapy   Informed consent: discussed and consent obtained   Lesion destroyed using liquid nitrogen: Yes   Region frozen until ice ball extended beyond lesion: Yes   Outcome: patient tolerated procedure well with no complications   Post-procedure details: wound care instructions given    AK (actinic keratosis) (2) Right Hand Dorsum x 1, Left Hand Dorsum x 1  Destruction of lesion - Right Hand Dorsum x 1, Left Hand Dorsum x 1  Destruction method: cryotherapy   Informed consent: discussed and consent obtained   Lesion destroyed using liquid nitrogen: Yes   Region frozen until ice ball extended beyond lesion: Yes   Outcome: patient tolerated procedure well with no complications   Post-procedure details: wound care instructions given     Return in about 6 months (around 08/02/2020) for TBSE. Also schedule PDT to face and chest, each area 1 month apart.Lindi Adie, CMA, am acting as scribe for Brendolyn Patty, MD .  Documentation: I have reviewed the above documentation for accuracy and completeness, and I agree with the above.  Brendolyn Patty MD

## 2020-02-04 ENCOUNTER — Ambulatory Visit: Payer: BC Managed Care – PPO

## 2020-02-04 DIAGNOSIS — M6281 Muscle weakness (generalized): Secondary | ICD-10-CM

## 2020-02-04 DIAGNOSIS — R262 Difficulty in walking, not elsewhere classified: Secondary | ICD-10-CM | POA: Diagnosis not present

## 2020-02-04 DIAGNOSIS — Z9181 History of falling: Secondary | ICD-10-CM

## 2020-02-05 NOTE — Therapy (Signed)
[facility] [address] [place], [address] Phone: [phone]   Fax:  [phone]  Physical Therapy Treatment  Patient Details  Name: Michelle Ruiz MRN: [id number] Date of Birth: 07/28/1970 No data recorded  Encounter Date: 02/04/2020   PT End of Session - 02/04/20 1740    Visit Number 111    Number of Visits 112    Date for PT Re-Evaluation 03/09/20    Authorization Type 3/10    PT Start Time 1730    PT Stop Time 1815    PT Time Calculation (min) 45 min    Equipment Utilized During Treatment Gait belt    Activity Tolerance Patient tolerated treatment well;No increased pain;Other (comment)    Behavior During Therapy [facility] for tasks assessed/performed;Anxious           Past Medical History:  Diagnosis Date   Abnormal Pap smear of cervix    Actinic keratosis    Anxiety    Basal cell carcinoma 04/14/2008   Right upper ant. thigh. BCC with sclerosis.   Basal cell carcinoma 08/14/2008   Right lat. lower thigh. Superficial.    Basal cell carcinoma 04/29/2019   Right mid forearm. Superficial and nodular patterns. EDC.   Kidney stones    MS (multiple sclerosis) ([place])    UTI (urinary tract infection)     Past Surgical History:  Procedure Laterality Date   BREAST BIOPSY Right 2010   stereotactic biopsy/ neg/ dr.byrnett   BREAST BIOPSY Left 2012   neg/dr. brynett   CESAREAN SECTION  2008   [facility]   TONSILLECTOMY  2006    There were no vitals filed for this visit.   Subjective Assessment - 02/04/20 1728    Subjective Patient states she went to the neurologist who recommended a rolling walker. Patient states she might use a rollator.    Pertinent History Patient reports history of falls x3 in the past 6 months secondary to drop foot on the L LE. PMH: Multiple sclerosis; elbow fracture of the R UE    Limitations Standing;Walking;Lifting    How long can you stand comfortably? 20 min     How long can you walk comfortably? 1 miles    Patient Stated Goals To be more balanced and core strengthening     Currently in Pain? No/denies    Pain Onset More than a month ago               INTERVENTIONS    Therapeutic Exercises  Walk speed test - 1.24 m/s - indicative of safe community ambulation TRX Squats - 2 x 15 Seated Hip flexion with 10# on each leg - 2 x 15 Monster walks with GTB around ankles - 6 x 11ft total Leg Extension at [facility] - unilaterally  x15 35# Knee extension with GTB around ankle - 2 x 15  Performed exercises to improve LE strength     PT Education - 02/04/20 1739    Education Details form/technique with exercise    Person(s) Educated Patient    Methods Explanation;Demonstration    Comprehension Verbalized understanding;Returned demonstration               PT Long Term Goals - 01/13/20 1707      PT LONG TERM GOAL #1   Title Patient will be independent with balance HEP to continue benefits of therapy after discharge.    Baseline dependent with form/technique for balance exercise; 03/25/2018: Independent  Time 6    Period Weeks    Status Achieved      PT LONG TERM GOAL #2   Title Patient will improve FGA by 4 points to indicate significant improvement with balancing while ambulating and decrease of fall risk    Baseline 03/25/2018: 28/30; 04/10/2019: 22; 07/21/2019: 29; 09/17/2019: 30/30    Time 6    Period Weeks    Status Achieved      PT LONG TERM GOAL #3   Title Patient will be able to perform prolonged single leg stance >10sec with eyes closed to indicate improvement with static balance and decrease fall risk    Baseline 2 sec in standing EC; 03/25/2018: 3 sec; 04/29/2018: 4 sec; 08/21/2018: 5 sec; 09/19/2018: 6 sec ; 10/21/2018 3 sec; 11/21/2018 5 sec; 01/08/2019: 9 sec; 02/25/2019: 14sec    Time 4    Period Weeks    Status Achieved      PT LONG TERM GOAL #4   Title Pt will improve 6 MWT >2071ft to demonstrate improved  cardiorespiratory fitness    Baseline Deferred to next session: 04/29/2018; 05/01/2018: 147ft; 08/21/2018: 149ft; 09/19/2018: 1441ft; 10/21/2018 1621; 11/21/2018 deferred to next session 2/2 time.; 01/08/2019: 1480 ft; 02/25/2019: 1361ft; 04/10/2019: 1400 ft, 05/13/19: 1321 ft; 07/21/2019: 1170 ft; 09/17/2019: 127ft; 11/17/2019: 1266ft; 01/13/20: 1353    Time 4    Period Weeks    Status On-going      PT LONG TERM GOAL #5   Title Patient will be able to run 270ft without stopping to better be able to return to recreational activites and address cardiovascular endurance.    Baseline 74ft; 02/25/2019: 33ft; 04/10/2019 deferred    Time 4    Period Weeks    Status Deferred      PT LONG TERM GOAL #6   Title Patient will improve L hip flexion strength to 4/5 for improved steppage gait and reduced fall risk.    Baseline 04/10/2019: L hip flexion 3+/5; 07/21/2019: L Hip 4/5    Time 6    Period Weeks    Status Achieved      PT LONG TERM GOAL #7   Title Patient will be able to lift her affected L LE to the same extent as the R LE to allow for greater amount of foot clearance when ambulating    Baseline L LE: 10", R LE: 15"; 11/17/2019: L LE: 14", R LE 17.5"; 01/13/2020: L LE: 15.5", R LE 18"    Time 8    Period Weeks    Status On-going      PT LONG TERM GOAL #8   Title Patient will have full dorsiflexion with 5/5 strength to allow for greater toe clearance with the performance of exercises, most notably when walking.    Baseline L 4/5 5 degree ; R 5/5 12 degree; L 4+/5 5 degree ; R 5/5 12 degree    Time 8    Period Weeks    Status On-going                 Plan - 02/05/20 1400    Clinical Impression Statement Performed exercises to address balance and strength limitations. Reevaluated walking speed with a result of a low risk of falling at community distances noted based on an ambulation speed of 1.24 m/s. Patient continues to have difficulty with foot clearance and has had increased difficulty  secondary to increased stress from occupational duties. Did not stress system as much as previous sessions. Patient  will benefit from further skilled therapy to return to prior level of function.    Personal Factors and Comorbidities Past/Current Experience    Examination-Activity Limitations Locomotion Level    Examination-Participation Restrictions Community Activity    Stability/Clinical Decision Making Stable/Uncomplicated    Rehab Potential Good    Clinical Impairments Affecting Rehab Potential (-) hx of MS; (+) good motivation    PT Frequency 2x / week    PT Duration 4 weeks    PT Treatment/Interventions Moist Heat;Iontophoresis 4mg /ml Dexamethasone;Electrical Stimulation;Aquatic Therapy;Cryotherapy;Therapeutic activities;Neuromuscular re-education;Therapeutic exercise;Patient/family education;Dry needling;Manual techniques;Passive range of motion;Balance training;Functional mobility training;Gait training;Stair training;Joint Manipulations;Prosthetic Training    PT Next Visit Plan Dynamic balance and LE strength exercise    PT Home Exercise Plan hip flexion in sitting for strengthening, hip flexion knee flexion in standing for balance, bike intervals at 70%-75% max HR    Consulted and Agree with Plan of Care Patient           Patient will benefit from skilled therapeutic intervention in order to improve the following deficits and impairments:  Pain, Decreased coordination, Impaired perceived functional ability, Increased fascial restricitons, Increased muscle spasms, Difficulty walking, Abnormal gait, Decreased balance, Decreased mobility, Impaired sensation, Increased edema  Visit Diagnosis: Difficulty in walking, not elsewhere classified  Muscle weakness (generalized)  History of falling     Problem List There are no problems to display for this patient.   [name], PT DPT 02/05/2020, 2:06 PM  [facility] [address] [place], [address] Phone: [phone]   Fax:  [phone]  Name: Michelle Ruiz MRN: [id number] Date of Birth: [birth year]

## 2020-02-11 ENCOUNTER — Other Ambulatory Visit: Payer: Self-pay

## 2020-02-11 ENCOUNTER — Ambulatory Visit: Payer: BC Managed Care – PPO

## 2020-02-11 DIAGNOSIS — R262 Difficulty in walking, not elsewhere classified: Secondary | ICD-10-CM

## 2020-02-11 DIAGNOSIS — M6281 Muscle weakness (generalized): Secondary | ICD-10-CM

## 2020-02-11 DIAGNOSIS — Z9181 History of falling: Secondary | ICD-10-CM

## 2020-02-11 NOTE — Therapy (Signed)
[facility] [address], [place], [address] Phone: [phone]   Fax:  [phone]  Physical Therapy Treatment  Patient Details  Name: Michelle Ruiz MRN: [id number] Date of Birth: 1971/01/06 No data recorded  Encounter Date: 02/11/2020   PT End of Session - 02/11/20 1733    Visit Number 112    Number of Visits 112    Date for PT Re-Evaluation 03/09/20    Authorization Type 4/10    PT Start Time 1730    PT Stop Time 1815    PT Time Calculation (min) 45 min    Equipment Utilized During Treatment Gait belt    Activity Tolerance Patient tolerated treatment well;No increased pain;Other (comment)    Behavior During Therapy [facility] for tasks assessed/performed;Anxious           Past Medical History:  Diagnosis Date  . Abnormal Pap smear of cervix   . Actinic keratosis   . Anxiety   . Basal cell carcinoma 04/14/2008   Right upper ant. thigh. BCC with sclerosis.  . Basal cell carcinoma 08/14/2008   Right lat. lower thigh. Superficial.   . Basal cell carcinoma 04/29/2019   Right mid forearm. Superficial and nodular patterns. EDC.  [facility] Kidney stones   . MS (multiple sclerosis) ([place])   . UTI (urinary tract infection)     Past Surgical History:  Procedure Laterality Date  . BREAST BIOPSY Right 2010   stereotactic biopsy/ neg/ dr.byrnett  . BREAST BIOPSY Left 2012   neg/dr. brynett  . CESAREAN SECTION  2008   [facility]  . TONSILLECTOMY  2006    There were no vitals filed for this visit.   Subjective Assessment - 02/11/20 1731    Subjective Patient states shes feeling good.    Patient is accompained by: Family member    Pertinent History Patient reports history of falls x3 in the past 6 months secondary to drop foot on the L LE. PMH: Multiple sclerosis; elbow fracture of the R UE    Limitations Standing;Walking;Lifting    How long can you stand comfortably? 20 min    How long can you walk comfortably? 1  miles    Patient Stated Goals To be more balanced and core strengthening     Currently in Pain? No/denies    Pain Onset More than a month ago           INTERVENTIONS    Therapeutic Exercises  TRX Squats - 2 x 15 Seated Hip flexion with 10# on each leg - 2 x 15 Monster walks with GTB around ankles - 6 x 27ft total Leg Extension at [place] - unilaterally x15 15# Toe Taps 16inch Step x15  Tandem walk x41ft     Performed exercises to improve LE strength       PT Education - 02/11/20 1732    Education Details Form/Technique with Exercise    Person(s) Educated Patient    Methods Explanation;Demonstration    Comprehension Verbalized understanding;Returned demonstration               PT Long Term Goals - 01/13/20 1707      PT LONG TERM GOAL #1   Title Patient will be independent with balance HEP to continue benefits of therapy after discharge.    Baseline dependent with form/technique for balance exercise; 03/25/2018: Independent     Time 6    Period Weeks    Status Achieved  PT LONG TERM GOAL #2   Title Patient will improve FGA by 4 points to indicate significant improvement with balancing while ambulating and decrease of fall risk    Baseline 03/25/2018: 28/30; 04/10/2019: 22; 07/21/2019: 29; 09/17/2019: 30/30    Time 6    Period Weeks    Status Achieved      PT LONG TERM GOAL #3   Title Patient will be able to perform prolonged single leg stance >10sec with eyes closed to indicate improvement with static balance and decrease fall risk    Baseline 2 sec in standing EC; 03/25/2018: 3 sec; 04/29/2018: 4 sec; 08/21/2018: 5 sec; 09/19/2018: 6 sec ; 10/21/2018 3 sec; 11/21/2018 5 sec; 01/08/2019: 9 sec; 02/25/2019: 14sec    Time 4    Period Weeks    Status Achieved      PT LONG TERM GOAL #4   Title Pt will improve 6 MWT >20104ft to demonstrate improved cardiorespiratory fitness    Baseline Deferred to next session: 04/29/2018; 05/01/2018: 1435ft; 08/21/2018: 1457ft; 09/19/2018:  1438ft; 10/21/2018 1621; 11/21/2018 deferred to next session 2/2 time.; 01/08/2019: 1480 ft; 02/25/2019: 1391ft; 04/10/2019: 1400 ft, 05/13/19: 1321 ft; 07/21/2019: 1170 ft; 09/17/2019: 1252ft; 11/17/2019: 1279ft; 01/13/20: 1353    Time 4    Period Weeks    Status On-going      PT LONG TERM GOAL #5   Title Patient will be able to run 277ft without stopping to better be able to return to recreational activites and address cardiovascular endurance.    Baseline 44ft; 02/25/2019: 43ft; 04/10/2019 deferred    Time 4    Period Weeks    Status Deferred      PT LONG TERM GOAL #6   Title Patient will improve L hip flexion strength to 4/5 for improved steppage gait and reduced fall risk.    Baseline 04/10/2019: L hip flexion 3+/5; 07/21/2019: L Hip 4/5    Time 6    Period Weeks    Status Achieved      PT LONG TERM GOAL #7   Title Patient will be able to lift her affected L LE to the same extent as the R LE to allow for greater amount of foot clearance when ambulating    Baseline L LE: 10", R LE: 15"; 11/17/2019: L LE: 14", R LE 17.5"; 01/13/2020: L LE: 15.5", R LE 18"    Time 8    Period Weeks    Status On-going      PT LONG TERM GOAL #8   Title Patient will have full dorsiflexion with 5/5 strength to allow for greater toe clearance with the performance of exercises, most notably when walking.    Baseline L 4/5 5 degree ; R 5/5 12 degree; L 4+/5 5 degree ; R 5/5 12 degree    Time 8    Period Weeks    Status On-going                 Plan - 02/11/20 1753    Clinical Impression Statement Continued to perform exercises addressing patient strength, endurance, and balance. Patients hip flexion endurance is limited demonstrated by fatigue during resisted hip flexion and difficulty clearing L foot along with dynamic balance activities. Patient will continue to benefit from skilled therapy to return to prior level of function.    Personal Factors and Comorbidities Past/Current Experience     Examination-Activity Limitations Locomotion Level    Examination-Participation Restrictions Community Activity    Stability/Clinical Decision Making Stable/Uncomplicated  Clinical Decision Making Low    Rehab Potential Good    Clinical Impairments Affecting Rehab Potential (-) hx of MS; (+) good motivation    PT Frequency 2x / week    PT Duration 4 weeks    PT Treatment/Interventions Moist Heat;Iontophoresis 4mg /ml Dexamethasone;Electrical Stimulation;Aquatic Therapy;Cryotherapy;Therapeutic activities;Neuromuscular re-education;Therapeutic exercise;Patient/family education;Dry needling;Manual techniques;Passive range of motion;Balance training;Functional mobility training;Gait training;Stair training;Joint Manipulations;Prosthetic Training    PT Next Visit Plan Dynamic balance and LE strength exercise    PT Home Exercise Plan hip flexion in sitting for strengthening, hip flexion knee flexion in standing for balance, bike intervals at 70%-75% max HR    Consulted and Agree with Plan of Care Patient           Patient will benefit from skilled therapeutic intervention in order to improve the following deficits and impairments:  Pain, Decreased coordination, Impaired perceived functional ability, Increased fascial restricitons, Increased muscle spasms, Difficulty walking, Abnormal gait, Decreased balance, Decreased mobility, Impaired sensation, Increased edema  Visit Diagnosis: Difficulty in walking, not elsewhere classified  Muscle weakness (generalized)  History of falling     Problem List There are no problems to display for this patient.  6:17 PM, 02/11/20 Margarito Liner, SPT Student Physical Therapist [place]  [phone]  Kandee Escalante 02/11/2020, 6:14 PM  [facility] [address], [place], [address] Phone: [phone]   Fax:  [phone]  Name: Michelle Ruiz MRN: [id number] Date of Birth:  22-Oct-1970

## 2020-02-25 ENCOUNTER — Other Ambulatory Visit: Payer: Self-pay

## 2020-02-25 ENCOUNTER — Ambulatory Visit: Payer: BC Managed Care – PPO | Attending: Neurology

## 2020-02-25 DIAGNOSIS — Z9181 History of falling: Secondary | ICD-10-CM | POA: Insufficient documentation

## 2020-02-25 DIAGNOSIS — M6281 Muscle weakness (generalized): Secondary | ICD-10-CM | POA: Insufficient documentation

## 2020-02-25 DIAGNOSIS — R262 Difficulty in walking, not elsewhere classified: Secondary | ICD-10-CM | POA: Diagnosis present

## 2020-02-26 NOTE — Therapy (Signed)
[facility] [address] [place], [address] Phone: [phone]   Fax:  [phone]  Physical Therapy Treatment  Patient Details  Name: Michelle Ruiz MRN: [id number] Date of Birth: 1970/08/20 No data recorded  Encounter Date: 02/25/2020   PT End of Session - 02/26/20 1256    Visit Number 113    Number of Visits 116    Date for PT Re-Evaluation 03/09/20    Authorization Type 5/10    PT Start Time 7591    PT Stop Time 1815    PT Time Calculation (min) 45 min    Equipment Utilized During Treatment Gait belt    Activity Tolerance Patient tolerated treatment well;No increased pain;Other (comment)    Behavior During Therapy [facility] for tasks assessed/performed;Anxious           Past Medical History:  Diagnosis Date   Abnormal Pap smear of cervix    Actinic keratosis    Anxiety    Basal cell carcinoma 04/14/2008   Right upper ant. thigh. BCC with sclerosis.   Basal cell carcinoma 08/14/2008   Right lat. lower thigh. Superficial.    Basal cell carcinoma 04/29/2019   Right mid forearm. Superficial and nodular patterns. EDC.   Kidney stones    MS (multiple sclerosis) ([place])    UTI (urinary tract infection)     Past Surgical History:  Procedure Laterality Date   BREAST BIOPSY Right 2010   stereotactic biopsy/ neg/ dr.byrnett   BREAST BIOPSY Left 2012   neg/dr. brynett   CESAREAN SECTION  2008   [facility]   TONSILLECTOMY  2006    There were no vitals filed for this visit.   Subjective Assessment - 02/25/20 1737    Subjective Patient states she had a fall onto her right side at work when distracted. Patient fell on her R side onto her UE and LE resulting in pain to her shoulder and knee on the affected R side.    Patient is accompained by: Family member    Pertinent History Patient reports history of falls x3 in the past 6 months secondary to drop foot on the L LE. PMH: Multiple sclerosis; elbow  fracture of the R UE    Limitations Standing;Walking;Lifting    How long can you stand comfortably? 20 min    How long can you walk comfortably? 1 miles    Patient Stated Goals To be more balanced and core strengthening     Currently in Pain? No/denies    Pain Onset More than a month ago              INTERVENTIONS    Therapeutic Exercises  TRX Squats - x 10 semi- single leg Hip flexion with 5# on each leg - x15 in standing Hip Extension in standing - unilaterally x15 5# Hip abduction in standing - unilaterally 2 x 10 5# Ambulation with use of 5# around L LE - x360ft    Performed exercises to improve LE strength       PT Education - 02/25/20 1741    Education Details form/technique with exercise    Person(s) Educated Patient    Methods Explanation;Demonstration    Comprehension Verbalized understanding;Returned demonstration               PT Long Term Goals - 01/13/20 1707      PT LONG TERM GOAL #1   Title Patient will be independent with balance HEP to continue benefits  of therapy after discharge.    Baseline dependent with form/technique for balance exercise; 03/25/2018: Independent     Time 6    Period Weeks    Status Achieved      PT LONG TERM GOAL #2   Title Patient will improve FGA by 4 points to indicate significant improvement with balancing while ambulating and decrease of fall risk    Baseline 03/25/2018: 28/30; 04/10/2019: 22; 07/21/2019: 29; 09/17/2019: 30/30    Time 6    Period Weeks    Status Achieved      PT LONG TERM GOAL #3   Title Patient will be able to perform prolonged single leg stance >10sec with eyes closed to indicate improvement with static balance and decrease fall risk    Baseline 2 sec in standing EC; 03/25/2018: 3 sec; 04/29/2018: 4 sec; 08/21/2018: 5 sec; 09/19/2018: 6 sec ; 10/21/2018 3 sec; 11/21/2018 5 sec; 01/08/2019: 9 sec; 02/25/2019: 14sec    Time 4    Period Weeks    Status Achieved      PT LONG TERM GOAL #4   Title Pt will  improve 6 MWT >2044ft to demonstrate improved cardiorespiratory fitness    Baseline Deferred to next session: 04/29/2018; 05/01/2018: 1440ft; 08/21/2018: 1485ft; 09/19/2018: 1411ft; 10/21/2018 1621; 11/21/2018 deferred to next session 2/2 time.; 01/08/2019: 1480 ft; 02/25/2019: 1333ft; 04/10/2019: 1400 ft, 05/13/19: 1321 ft; 07/21/2019: 1170 ft; 09/17/2019: 1261ft; 11/17/2019: 1264ft; 01/13/20: 1353    Time 4    Period Weeks    Status On-going      PT LONG TERM GOAL #5   Title Patient will be able to run 274ft without stopping to better be able to return to recreational activites and address cardiovascular endurance.    Baseline 70ft; 02/25/2019: 25ft; 04/10/2019 deferred    Time 4    Period Weeks    Status Deferred      PT LONG TERM GOAL #6   Title Patient will improve L hip flexion strength to 4/5 for improved steppage gait and reduced fall risk.    Baseline 04/10/2019: L hip flexion 3+/5; 07/21/2019: L Hip 4/5    Time 6    Period Weeks    Status Achieved      PT LONG TERM GOAL #7   Title Patient will be able to lift her affected L LE to the same extent as the R LE to allow for greater amount of foot clearance when ambulating    Baseline L LE: 10", R LE: 15"; 11/17/2019: L LE: 14", R LE 17.5"; 01/13/2020: L LE: 15.5", R LE 18"    Time 8    Period Weeks    Status On-going      PT LONG TERM GOAL #8   Title Patient will have full dorsiflexion with 5/5 strength to allow for greater toe clearance with the performance of exercises, most notably when walking.    Baseline L 4/5 5 degree ; R 5/5 12 degree; L 4+/5 5 degree ; R 5/5 12 degree    Time 8    Period Weeks    Status On-going                 Plan - 02/26/20 1257    Clinical Impression Statement Patient demonstrates improvement with ability to single leg squat, standing weighted marches, and ambulation with ankle weight along the affected LE. Patient fatigues quickly with performance of hip flexion and Active dorsiflexion. Patient will  benefit from further skilled therapy to return to  prior level of function.    Personal Factors and Comorbidities Past/Current Experience    Examination-Activity Limitations Locomotion Level    Examination-Participation Restrictions Community Activity    Stability/Clinical Decision Making Stable/Uncomplicated    Rehab Potential Good    Clinical Impairments Affecting Rehab Potential (-) hx of MS; (+) good motivation    PT Frequency 2x / week    PT Duration 4 weeks    PT Treatment/Interventions Moist Heat;Iontophoresis 4mg /ml Dexamethasone;Electrical Stimulation;Aquatic Therapy;Cryotherapy;Therapeutic activities;Neuromuscular re-education;Therapeutic exercise;Patient/family education;Dry needling;Manual techniques;Passive range of motion;Balance training;Functional mobility training;Gait training;Stair training;Joint Manipulations;Prosthetic Training    PT Next Visit Plan Dynamic balance and LE strength exercise    PT Home Exercise Plan hip flexion in sitting for strengthening, hip flexion knee flexion in standing for balance, bike intervals at 70%-75% max HR    Consulted and Agree with Plan of Care Patient           Patient will benefit from skilled therapeutic intervention in order to improve the following deficits and impairments:  Pain, Decreased coordination, Impaired perceived functional ability, Increased fascial restricitons, Increased muscle spasms, Difficulty walking, Abnormal gait, Decreased balance, Decreased mobility, Impaired sensation, Increased edema  Visit Diagnosis: Difficulty in walking, not elsewhere classified  Muscle weakness (generalized)     Problem List There are no problems to display for this patient.   [name], PT DPT 02/26/2020, 1:05 PM  [facility] [address], [place], [address] Phone: [phone]   Fax:  [phone]  Name: KALEEYA HANCOCK MRN: [id number] Date of Birth:  [birth year]

## 2020-03-03 ENCOUNTER — Ambulatory Visit: Payer: BC Managed Care – PPO

## 2020-03-03 ENCOUNTER — Other Ambulatory Visit: Payer: Self-pay

## 2020-03-03 DIAGNOSIS — R262 Difficulty in walking, not elsewhere classified: Secondary | ICD-10-CM | POA: Diagnosis not present

## 2020-03-03 DIAGNOSIS — M6281 Muscle weakness (generalized): Secondary | ICD-10-CM

## 2020-03-03 DIAGNOSIS — Z9181 History of falling: Secondary | ICD-10-CM

## 2020-03-04 NOTE — Therapy (Signed)
[facility] [address], [place], [address] Phone: [phone]   Fax:  [phone]  Physical Therapy Treatment  Patient Details  Name: Michelle Ruiz MRN: [id number] Date of Birth: 05-29-1970 No data recorded  Encounter Date: 03/03/2020   PT End of Session - 03/04/20 1324    Visit Number 114    Number of Visits 116    Date for PT Re-Evaluation 03/09/20    Authorization Type 6/10    PT Start Time 1815    PT Stop Time 1900    PT Time Calculation (min) 45 min    Equipment Utilized During Treatment Gait belt    Activity Tolerance Patient tolerated treatment well;No increased pain;Other (comment)    Behavior During Therapy [facility] for tasks assessed/performed;Anxious           Past Medical History:  Diagnosis Date  . Abnormal Pap smear of cervix   . Actinic keratosis   . Anxiety   . Basal cell carcinoma 04/14/2008   Right upper ant. thigh. BCC with sclerosis.  . Basal cell carcinoma 08/14/2008   Right lat. lower thigh. Superficial.   . Basal cell carcinoma 04/29/2019   Right mid forearm. Superficial and nodular patterns. EDC.  Marland Kitchen Kidney stones   . MS (multiple sclerosis) ([place])   . UTI (urinary tract infection)     Past Surgical History:  Procedure Laterality Date  . BREAST BIOPSY Right 2010   stereotactic biopsy/ neg/ dr.byrnett  . BREAST BIOPSY Left 2012   neg/dr. brynett  . CESAREAN SECTION  2008   [facility]  . TONSILLECTOMY  2006    There were no vitals filed for this visit.   Subjective Assessment - 03/03/20 1819    Subjective Patient states increased fatigue on Monday which lasted for 24hr then since last night has been experiencing cramps along her LEs.    Patient is accompained by: Family member    Pertinent History Patient reports history of falls x3 in the past 6 months secondary to drop foot on the L LE. PMH: Multiple sclerosis; elbow fracture of the R UE    Limitations  Standing;Walking;Lifting    How long can you stand comfortably? 20 min    How long can you walk comfortably? 1 miles    Patient Stated Goals To be more balanced and core strengthening     Currently in Pain? No/denies    Pain Onset More than a month ago              TREATMENT Therapeutic Exercise  Standing calf stretch with UE support - 3 x 30 sec  Standing quad stretch - 2 x 30sec Hamstring stretch -2 x 30sec  Hip flexor stretch - 2 x 30sec Hip swings forward/backward - 2 x 30 sec Hip swings laterally - 2 x 30 sec  Lateral lunges - x 20 Squats with UE support - x 20   Performed exercises to address range of motion limitations     PT Education - 03/03/20 1821    Education Details form/technique with exercise    Person(s) Educated Patient    Methods Explanation;Demonstration    Comprehension Verbalized understanding;Returned demonstration               PT Long Term Goals - 01/13/20 1707      PT LONG TERM GOAL #1   Title Patient will be independent with balance HEP to continue benefits of therapy after discharge.  Baseline dependent with form/technique for balance exercise; 03/25/2018: Independent     Time 6    Period Weeks    Status Achieved      PT LONG TERM GOAL #2   Title Patient will improve FGA by 4 points to indicate significant improvement with balancing while ambulating and decrease of fall risk    Baseline 03/25/2018: 28/30; 04/10/2019: 22; 07/21/2019: 29; 09/17/2019: 30/30    Time 6    Period Weeks    Status Achieved      PT LONG TERM GOAL #3   Title Patient will be able to perform prolonged single leg stance >10sec with eyes closed to indicate improvement with static balance and decrease fall risk    Baseline 2 sec in standing EC; 03/25/2018: 3 sec; 04/29/2018: 4 sec; 08/21/2018: 5 sec; 09/19/2018: 6 sec ; 10/21/2018 3 sec; 11/21/2018 5 sec; 01/08/2019: 9 sec; 02/25/2019: 14sec    Time 4    Period Weeks    Status Achieved      PT LONG TERM GOAL #4    Title Pt will improve 6 MWT >2070ft to demonstrate improved cardiorespiratory fitness    Baseline Deferred to next session: 04/29/2018; 05/01/2018: 149ft; 08/21/2018: 1479ft; 09/19/2018: 1460ft; 10/21/2018 1621; 11/21/2018 deferred to next session 2/2 time.; 01/08/2019: 1480 ft; 02/25/2019: 1331ft; 04/10/2019: 1400 ft, 05/13/19: 1321 ft; 07/21/2019: 1170 ft; 09/17/2019: 1219ft; 11/17/2019: 1241ft; 01/13/20: 1353    Time 4    Period Weeks    Status On-going      PT LONG TERM GOAL #5   Title Patient will be able to run 269ft without stopping to better be able to return to recreational activites and address cardiovascular endurance.    Baseline 40ft; 02/25/2019: 37ft; 04/10/2019 deferred    Time 4    Period Weeks    Status Deferred      PT LONG TERM GOAL #6   Title Patient will improve L hip flexion strength to 4/5 for improved steppage gait and reduced fall risk.    Baseline 04/10/2019: L hip flexion 3+/5; 07/21/2019: L Hip 4/5    Time 6    Period Weeks    Status Achieved      PT LONG TERM GOAL #7   Title Patient will be able to lift her affected L LE to the same extent as the R LE to allow for greater amount of foot clearance when ambulating    Baseline L LE: 10", R LE: 15"; 11/17/2019: L LE: 14", R LE 17.5"; 01/13/2020: L LE: 15.5", R LE 18"    Time 8    Period Weeks    Status On-going      PT LONG TERM GOAL #8   Title Patient will have full dorsiflexion with 5/5 strength to allow for greater toe clearance with the performance of exercises, most notably when walking.    Baseline L 4/5 5 degree ; R 5/5 12 degree; L 4+/5 5 degree ; R 5/5 12 degree    Time 8    Period Weeks    Status On-going                 Plan - 03/04/20 1325    Clinical Impression Statement Patient demonstrates decreased mobility with performing all LE exercises today. Patient reports bradykinesia for the past day, this is evident during today's session thus focused on mobility based exercises to improve muscle length  and coordination. Patient with noted difficulty throughout the session. Patient will benefit from further skilled therapy  to improve functional capacity.    Personal Factors and Comorbidities Past/Current Experience    Examination-Activity Limitations Locomotion Level    Examination-Participation Restrictions Community Activity    Stability/Clinical Decision Making Stable/Uncomplicated    Rehab Potential Good    Clinical Impairments Affecting Rehab Potential (-) hx of MS; (+) good motivation    PT Frequency 2x / week    PT Duration 4 weeks    PT Treatment/Interventions Moist Heat;Iontophoresis 4mg /ml Dexamethasone;Electrical Stimulation;Aquatic Therapy;Cryotherapy;Therapeutic activities;Neuromuscular re-education;Therapeutic exercise;Patient/family education;Dry needling;Manual techniques;Passive range of motion;Balance training;Functional mobility training;Gait training;Stair training;Joint Manipulations;Prosthetic Training    PT Next Visit Plan Dynamic balance and LE strength exercise    PT Home Exercise Plan hip flexion in sitting for strengthening, hip flexion knee flexion in standing for balance, bike intervals at 70%-75% max HR    Consulted and Agree with Plan of Care Patient           Patient will benefit from skilled therapeutic intervention in order to improve the following deficits and impairments:  Pain,Decreased coordination,Impaired perceived functional ability,Increased fascial restricitons,Increased muscle spasms,Difficulty walking,Abnormal gait,Decreased balance,Decreased mobility,Impaired sensation,Increased edema  Visit Diagnosis: Difficulty in walking, not elsewhere classified  Muscle weakness (generalized)  History of falling     Problem List There are no problems to display for this patient.   [name], PT DPT 03/04/2020, 3:24 PM  [facility] [address] [place], [address] Phone:  [phone]   Fax:  [phone]  Name: Michelle Ruiz MRN: [id number] Date of Birth: 06/02/1970

## 2020-03-15 ENCOUNTER — Ambulatory Visit: Payer: BC Managed Care – PPO

## 2020-03-15 ENCOUNTER — Other Ambulatory Visit: Payer: Self-pay

## 2020-03-15 DIAGNOSIS — Z9181 History of falling: Secondary | ICD-10-CM

## 2020-03-15 DIAGNOSIS — R262 Difficulty in walking, not elsewhere classified: Secondary | ICD-10-CM | POA: Diagnosis not present

## 2020-03-15 DIAGNOSIS — M6281 Muscle weakness (generalized): Secondary | ICD-10-CM

## 2020-03-16 NOTE — Therapy (Signed)
[facility] [address] [place], [address] Phone: [phone]   Fax:  [phone]  Physical Therapy Treatment/ Progress Note  Patient Details  Name: Michelle Ruiz MRN: [id number] Date of Birth: 17-Jul-1970 No data recorded  Reporting Period: 12/16/2019- 03/15/2020  Encounter Date: 03/15/2020   PT End of Session - 03/16/20 1037    Visit Number 115    Number of Visits 116    Date for PT Re-Evaluation 03/09/20    Authorization Type 7/10    PT Start Time 1815    PT Stop Time 1900    PT Time Calculation (min) 45 min    Equipment Utilized During Treatment Gait belt    Activity Tolerance Patient tolerated treatment well;No increased pain;Other (comment)    Behavior During Therapy [facility] for tasks assessed/performed;Anxious           Past Medical History:  Diagnosis Date   Abnormal Pap smear of cervix    Actinic keratosis    Anxiety    Basal cell carcinoma 04/14/2008   Right upper ant. thigh. BCC with sclerosis.   Basal cell carcinoma 08/14/2008   Right lat. lower thigh. Superficial.    Basal cell carcinoma 04/29/2019   Right mid forearm. Superficial and nodular patterns. EDC.   Kidney stones    MS (multiple sclerosis) ([facility])    UTI (urinary tract infection)     Past Surgical History:  Procedure Laterality Date   BREAST BIOPSY Right 2010   stereotactic biopsy/ neg/ dr.byrnett   BREAST BIOPSY Left 2012   neg/dr. brynett   CESAREAN SECTION  2008   [facility]   TONSILLECTOMY  2006    There were no vitals filed for this visit.   Subjective Assessment - 03/15/20 1820    Subjective Patient reports her LE has been feeling better compared to the previous session. Patient states she feels this is from her Provigil medication.    Patient is accompained by: Family member    Pertinent History Patient reports history of falls x3 in the past 6 months secondary to drop foot on the L LE. PMH: Multiple  sclerosis; elbow fracture of the R UE    Limitations Standing;Walking;Lifting    How long can you stand comfortably? 20 min    How long can you walk comfortably? 1 miles    Patient Stated Goals To be more balanced and core strengthening     Currently in Pain? No/denies    Pain Onset More than a month ago               TREATMENT Therapeutic Exercise Sit to stands with use of UE with TRX - 2 x 10 with long sitting rest break Ambulation performed with increased focus on improving hip flexion - 6 min  Hip flexion in sitting - x 30 Performed exercises to improve LE strengthening    PT Education - 03/15/20 1821    Education Details form/technique with exercise    Person(s) Educated Patient    Methods Explanation;Demonstration    Comprehension Verbalized understanding;Returned demonstration               PT Long Term Goals - 03/15/20 1824      PT LONG TERM GOAL #1   Title Patient will be independent with balance HEP to continue benefits of therapy after discharge.    Baseline dependent with form/technique for balance exercise; 03/25/2018: Independent     Time 6    Period  Weeks    Status Achieved      PT LONG TERM GOAL #2   Title Patient will improve FGA by 4 points to indicate significant improvement with balancing while ambulating and decrease of fall risk    Baseline 03/25/2018: 28/30; 04/10/2019: 22; 07/21/2019: 29; 09/17/2019: 30/30    Time 6    Period Weeks    Status Achieved      PT LONG TERM GOAL #3   Title Patient will be able to perform prolonged single leg stance >10sec with eyes closed to indicate improvement with static balance and decrease fall risk    Baseline 2 sec in standing EC; 03/25/2018: 3 sec; 04/29/2018: 4 sec; 08/21/2018: 5 sec; 09/19/2018: 6 sec ; 10/21/2018 3 sec; 11/21/2018 5 sec; 01/08/2019: 9 sec; 02/25/2019: 14sec    Time 4    Period Weeks    Status Achieved      PT LONG TERM GOAL #4   Title Pt will improve 6 MWT >2079ft to demonstrate improved  cardiorespiratory fitness    Baseline Deferred to next session: 04/29/2018; 05/01/2018: 1423ft; 08/21/2018: 1448ft; 09/19/2018: 1461ft; 10/21/2018 1621; 11/21/2018 deferred to next session 2/2 time.; 01/08/2019: 1480 ft; 02/25/2019: 1389ft; 04/10/2019: 1400 ft, 05/13/19: 1321 ft; 07/21/2019: 1170 ft; 09/17/2019: 1213ft; 11/17/2019: 1254ft; 01/13/20: 1353; 03/15/20: 1251ft    Time 4    Period Weeks    Status On-going      PT LONG TERM GOAL #5   Title Patient will be able to run 210ft without stopping to better be able to return to recreational activites and address cardiovascular endurance.    Baseline 32ft; 02/25/2019: 30ft; 04/10/2019 deferred    Time 4    Period Weeks    Status Deferred      PT LONG TERM GOAL #6   Title Patient will improve L hip flexion strength to 4/5 for improved steppage gait and reduced fall risk.    Baseline 04/10/2019: L hip flexion 3+/5; 07/21/2019: L Hip 4/5    Time 6    Period Weeks    Status Achieved      PT LONG TERM GOAL #7   Title Patient will be able to lift her affected L LE to the same extent as the R LE to allow for greater amount of foot clearance when ambulating    Baseline L LE: 10", R LE: 15"; 11/17/2019: L LE: 14", R LE 17.5"; 01/13/2020: L LE: 15.5", R LE 18"; 12/20: 18" B    Time 8    Period Weeks    Status Achieved      PT LONG TERM GOAL #8   Title Patient will have full dorsiflexion with 5/5 strength to allow for greater toe clearance with the performance of exercises, most notably when walking.    Baseline L 4/5 5 degree ; R 5/5 12 degree; L 4+/5 5 degree ; R 5/5 12 degree    Time 8    Period Weeks    Status Achieved      PT LONG TERM GOAL  #9   TITLE Patient will have no scuffing along L LE with performance of 6 min WT to indicate                 Plan - 03/16/20 1351    Clinical Impression Statement Patient has been making progress towards long term goals with improvement in ability to perform greater amount of hip flexion compared to  previous sessions with the L LE equal to the R LE  indicate further level of improvmeent in function. Although patient is improving she has not made progress towards 66minWT and had 4 "scuffs" of her foot indicating increased risk of falling. Patient will benefit from further skilled therapy focused on improving these limitations to return to prior level of function.    Personal Factors and Comorbidities Past/Current Experience    Examination-Activity Limitations Locomotion Level    Examination-Participation Restrictions Community Activity    Stability/Clinical Decision Making Stable/Uncomplicated    Rehab Potential Good    Clinical Impairments Affecting Rehab Potential (-) hx of MS; (+) good motivation    PT Frequency 2x / week    PT Duration 4 weeks    PT Treatment/Interventions Moist Heat;Iontophoresis 4mg /ml Dexamethasone;Electrical Stimulation;Aquatic Therapy;Cryotherapy;Therapeutic activities;Neuromuscular re-education;Therapeutic exercise;Patient/family education;Dry needling;Manual techniques;Passive range of motion;Balance training;Functional mobility training;Gait training;Stair training;Joint Manipulations;Prosthetic Training    PT Next Visit Plan Dynamic balance and LE strength exercise    PT Home Exercise Plan hip flexion in sitting for strengthening, hip flexion knee flexion in standing for balance, bike intervals at 70%-75% max HR    Consulted and Agree with Plan of Care Patient           Patient will benefit from skilled therapeutic intervention in order to improve the following deficits and impairments:  Pain,Decreased coordination,Impaired perceived functional ability,Increased fascial restricitons,Increased muscle spasms,Difficulty walking,Abnormal gait,Decreased balance,Decreased mobility,Impaired sensation,Increased edema  Visit Diagnosis: Difficulty in walking, not elsewhere classified  Muscle weakness (generalized)  History of falling     Problem List There are no  problems to display for this patient.   [name], PT DPT 03/16/2020, 2:00 PM  [facility] [address] [place], [address] Phone: [phone]   Fax:  [phone]  Name: ASHYIA SCHRAEDER MRN: [id number] Date of Birth: 1971/01/22

## 2020-03-16 NOTE — Addendum Note (Signed)
Addended by: Blain Pais on: 03/16/2020 02:24 PM   Modules accepted: Orders

## 2020-03-24 ENCOUNTER — Ambulatory Visit: Payer: BC Managed Care – PPO

## 2020-03-31 ENCOUNTER — Other Ambulatory Visit: Payer: Self-pay

## 2020-03-31 ENCOUNTER — Ambulatory Visit: Payer: BC Managed Care – PPO | Attending: Neurology

## 2020-03-31 DIAGNOSIS — R262 Difficulty in walking, not elsewhere classified: Secondary | ICD-10-CM | POA: Insufficient documentation

## 2020-03-31 DIAGNOSIS — M6281 Muscle weakness (generalized): Secondary | ICD-10-CM | POA: Diagnosis present

## 2020-03-31 DIAGNOSIS — Z9181 History of falling: Secondary | ICD-10-CM | POA: Diagnosis present

## 2020-03-31 NOTE — Therapy (Signed)
[facility] [address] [place], [address] Phone: [phone]   Fax:  [phone]  Physical Therapy Treatment  Patient Details  Name: Michelle Ruiz MRN: [id number] Date of Birth: [birth year] No data recorded  Encounter Date: 03/31/2020   PT End of Session - 03/31/20 1752    Visit Number 116    Number of Visits 116    Date for PT Re-Evaluation 06/08/19    Authorization Type 7/10    PT Start Time 1715    PT Stop Time 1800    PT Time Calculation (min) 45 min    Equipment Utilized During Treatment Gait belt    Activity Tolerance Patient tolerated treatment well;No increased pain;Other (comment)    Behavior During Therapy [facility] for tasks assessed/performed;Anxious           Past Medical History:  Diagnosis Date  . Abnormal Pap smear of cervix   . Actinic keratosis   . Anxiety   . Basal cell carcinoma 04/14/2008   Right upper ant. thigh. BCC with sclerosis.  . Basal cell carcinoma 08/14/2008   Right lat. lower thigh. Superficial.   . Basal cell carcinoma 04/29/2019   Right mid forearm. Superficial and nodular patterns. EDC.  Marland Kitchen Kidney stones   . MS (multiple sclerosis) ([facility])   . UTI (urinary tract infection)     Past Surgical History:  Procedure Laterality Date  . BREAST BIOPSY Right 2010   stereotactic biopsy/ neg/ dr.byrnett  . BREAST BIOPSY Left 2012   neg/dr. brynett  . CESAREAN SECTION  2008   [facility]  . TONSILLECTOMY  2006    There were no vitals filed for this visit.   Subjective Assessment - 03/31/20 1733    Subjective Patient states she had a COVID scare and wasn't able to enjoy the holiday. Patient states no major changes otherwise.    Patient is accompained by: Family member    Pertinent History Patient reports history of falls x3 in the past 6 months secondary to drop foot on the L LE. PMH: Multiple sclerosis; elbow fracture of the R UE    Limitations Standing;Walking;Lifting    How  long can you stand comfortably? 20 min    How long can you walk comfortably? 1 miles    Patient Stated Goals To be more balanced and core strengthening     Currently in Pain? No/denies    Pain Onset More than a month ago              TREATMENT Therapeutic Exercise Sit to stands with use of UE with TRX -  x 10 with long sitting rest break Ambulation performed with increased focus on improving hip flexion with 5# ankle weights on her ankles - 681ft Hip flexion in Standing on airex pad  - x 30 with 5# Ankle dorsiflexion with 5# ankle weight - x10 SLS with forefoot on airex pad - x 10 Step ups with ankle weights around ankles onto airex pad - x 10    Performed exercises to improve LE strengthening      PT Education - 03/31/20 1752    Education Details form/technique with exercise    Person(s) Educated Patient    Methods Explanation;Demonstration    Comprehension Returned demonstration;Verbalized understanding               PT Long Term Goals - 03/15/20 1824      PT LONG TERM GOAL #1   Title Patient  will be independent with balance HEP to continue benefits of therapy after discharge.    Baseline dependent with form/technique for balance exercise; 03/25/2018: Independent     Time 6    Period Weeks    Status Achieved      PT LONG TERM GOAL #2   Title Patient will improve FGA by 4 points to indicate significant improvement with balancing while ambulating and decrease of fall risk    Baseline 03/25/2018: 28/30; 04/10/2019: 22; 07/21/2019: 29; 09/17/2019: 30/30    Time 6    Period Weeks    Status Achieved      PT LONG TERM GOAL #3   Title Patient will be able to perform prolonged single leg stance >10sec with eyes closed to indicate improvement with static balance and decrease fall risk    Baseline 2 sec in standing EC; 03/25/2018: 3 sec; 04/29/2018: 4 sec; 08/21/2018: 5 sec; 09/19/2018: 6 sec ; 10/21/2018 3 sec; 11/21/2018 5 sec; 01/08/2019: 9 sec; 02/25/2019: 14sec    Time 4     Period Weeks    Status Achieved      PT LONG TERM GOAL #4   Title Pt will improve 6 MWT >2069ft to demonstrate improved cardiorespiratory fitness    Baseline Deferred to next session: 04/29/2018; 05/01/2018: 1475ft; 08/21/2018: 1422ft; 09/19/2018: 143ft; 10/21/2018 1621; 11/21/2018 deferred to next session 2/2 time.; 01/08/2019: 1480 ft; 02/25/2019: 1349ft; 04/10/2019: 1400 ft, 05/13/19: 1321 ft; 07/21/2019: 1170 ft; 09/17/2019: 1253ft; 11/17/2019: 1223ft; 01/13/20: [id number]; 03/15/20: 1245ft    Time 4    Period Weeks    Status On-going      PT LONG TERM GOAL #5   Title Patient will be able to run 259ft without stopping to better be able to return to recreational activites and address cardiovascular endurance.    Baseline 21ft; 02/25/2019: 24ft; 04/10/2019 deferred    Time 4    Period Weeks    Status Deferred      PT LONG TERM GOAL #6   Title Patient will improve L hip flexion strength to 4/5 for improved steppage gait and reduced fall risk.    Baseline 04/10/2019: L hip flexion 3+/5; 07/21/2019: L Hip 4/5    Time 6    Period Weeks    Status Achieved      PT LONG TERM GOAL #7   Title Patient will be able to lift her affected L LE to the same extent as the R LE to allow for greater amount of foot clearance when ambulating    Baseline L LE: 10", R LE: 15"; 11/17/2019: L LE: 14", R LE 17.5"; 01/13/2020: L LE: 15.5", R LE 18"; 12/20: 18" B    Time 8    Period Weeks    Status Achieved      PT LONG TERM GOAL #8   Title Patient will have full dorsiflexion with 5/5 strength to allow for greater toe clearance with the performance of exercises, most notably when walking.    Baseline L 4/5 5 degree ; R 5/5 12 degree; L 4+/5 5 degree ; R 5/5 12 degree    Time 8    Period Weeks    Status Achieved      PT LONG TERM GOAL  #9   TITLE Patient will have no scuffing along L LE with performance of 6 min WT to indicate                 Plan - 03/31/20 1753    Clinical Impression  Statement Decreased foot  scuffing with performance of walking with 5# ankle weights compared to previous sessions indicating improvement in coordination with dorsiflexion activation. Patient continues to have decreased foot clearance with walking, most notably after prolonged performance secondary to fatigue. Patient will benefit from further skileld therapy focused on improving limitations to return to prior level of function.    Personal Factors and Comorbidities Past/Current Experience    Examination-Activity Limitations Locomotion Level    Examination-Participation Restrictions Community Activity    Stability/Clinical Decision Making Stable/Uncomplicated    Rehab Potential Good    Clinical Impairments Affecting Rehab Potential (-) hx of MS; (+) good motivation    PT Frequency 2x / week    PT Duration 4 weeks    PT Treatment/Interventions Moist Heat;Iontophoresis 4mg /ml Dexamethasone;Electrical Stimulation;Aquatic Therapy;Cryotherapy;Therapeutic activities;Neuromuscular re-education;Therapeutic exercise;Patient/family education;Dry needling;Manual techniques;Passive range of motion;Balance training;Functional mobility training;Gait training;Stair training;Joint Manipulations;Prosthetic Training    PT Next Visit Plan Dynamic balance and LE strength exercise    PT Home Exercise Plan hip flexion in sitting for strengthening, hip flexion knee flexion in standing for balance, bike intervals at 70%-75% max HR    Consulted and Agree with Plan of Care Patient           Patient will benefit from skilled therapeutic intervention in order to improve the following deficits and impairments:  Pain,Decreased coordination,Impaired perceived functional ability,Increased fascial restricitons,Increased muscle spasms,Difficulty walking,Abnormal gait,Decreased balance,Decreased mobility,Impaired sensation,Increased edema  Visit Diagnosis: Difficulty in walking, not elsewhere classified  Muscle weakness (generalized)     Problem  List There are no problems to display for this patient.   [name], PT DPT 03/31/2020, 7:04 PM  [place] [address], [place], [address] Phone: [phone]   Fax:  [phone]  Name: Michelle Ruiz MRN: [id number] Date of Birth: 06/20/1970

## 2020-04-01 ENCOUNTER — Ambulatory Visit: Payer: BC Managed Care – PPO

## 2020-04-07 ENCOUNTER — Other Ambulatory Visit: Payer: Self-pay

## 2020-04-07 ENCOUNTER — Ambulatory Visit: Payer: BC Managed Care – PPO

## 2020-04-07 DIAGNOSIS — Z9181 History of falling: Secondary | ICD-10-CM

## 2020-04-07 DIAGNOSIS — M6281 Muscle weakness (generalized): Secondary | ICD-10-CM

## 2020-04-07 DIAGNOSIS — R262 Difficulty in walking, not elsewhere classified: Secondary | ICD-10-CM | POA: Diagnosis not present

## 2020-04-08 NOTE — Therapy (Signed)
[facility] [address] [place], [address] Phone: [phone]   Fax:  [phone]  Physical Therapy Treatment  Patient Details  Name: Michelle Ruiz MRN: [id number] Date of Birth: [birth year] No data recorded  Encounter Date: 04/07/2020   PT End of Session - 04/07/20 1736    Visit Number [id number]    Number of Visits 124    Date for PT Re-Evaluation 06/08/19    Authorization Type 1/10    PT Start Time [id number]    PT Stop Time 1800    PT Time Calculation (min) 45 min    Equipment Utilized During Treatment Gait belt    Activity Tolerance Patient tolerated treatment well;No increased pain;Other (comment)    Behavior During Therapy [facility] for tasks assessed/performed;Anxious           Past Medical History:  Diagnosis Date  . Abnormal Pap smear of cervix   . Actinic keratosis   . Anxiety   . Basal cell carcinoma 04/14/2008   Right upper ant. thigh. BCC with sclerosis.  . Basal cell carcinoma 08/14/2008   Right lat. lower thigh. Superficial.   . Basal cell carcinoma 04/29/2019   Right mid forearm. Superficial and nodular patterns. EDC.  Marland Kitchen Kidney stones   . MS (multiple sclerosis) ([place])   . UTI (urinary tract infection)     Past Surgical History:  Procedure Laterality Date  . BREAST BIOPSY Right 2010   stereotactic biopsy/ neg/ dr.byrnett  . BREAST BIOPSY Left 2012   neg/dr. brynett  . CESAREAN SECTION  2008   RPH  . TONSILLECTOMY  2006    There were no vitals filed for this visit.   Subjective Assessment - 04/07/20 1727    Subjective Patient reports she has been having glute spasms at night when rolling in bed and would like to address this limitation in therapy.    Patient is accompained by: Family member    Pertinent History Patient reports history of falls x3 in the past 6 months secondary to drop foot on the L LE. PMH: Multiple sclerosis; elbow fracture of the R UE    Limitations  Standing;Walking;Lifting    How long can you stand comfortably? 20 min    How long can you walk comfortably? 1 miles    Patient Stated Goals To be more balanced and core strengthening     Currently in Pain? No/denies    Pain Onset More than a month ago                 TREATMENT Therapeutic Exercise Sit to stands with use of UE with TRX -  x 20 with long sitting rest break Prone hip extension with knee bent - x 10, x 5 with performing leg extension at top of motion Standing hip extension at the hip machine - x 10 at 70#, x10 at 55# Deadlifts with 20# kb - 2 x 7     Performed exercises to improve LE strengthening        PT Education - 04/07/20 1734    Education Details form/technique with exercise    Person(s) Educated Patient    Methods Explanation;Demonstration    Comprehension Verbalized understanding;Returned demonstration               PT Long Term Goals - 03/15/20 1824      PT LONG TERM GOAL #1   Title Patient will be independent with balance HEP to continue benefits of  therapy after discharge.    Baseline dependent with form/technique for balance exercise; 03/25/2018: Independent     Time 6    Period Weeks    Status Achieved      PT LONG TERM GOAL #2   Title Patient will improve FGA by 4 points to indicate significant improvement with balancing while ambulating and decrease of fall risk    Baseline 03/25/2018: 28/30; 04/10/2019: 22; 07/21/2019: 29; 09/17/2019: 30/30    Time 6    Period Weeks    Status Achieved      PT LONG TERM GOAL #3   Title Patient will be able to perform prolonged single leg stance >10sec with eyes closed to indicate improvement with static balance and decrease fall risk    Baseline 2 sec in standing EC; 03/25/2018: 3 sec; 04/29/2018: 4 sec; 08/21/2018: 5 sec; 09/19/2018: 6 sec ; 10/21/2018 3 sec; 11/21/2018 5 sec; 01/08/2019: 9 sec; 02/25/2019: 14sec    Time 4    Period Weeks    Status Achieved      PT LONG TERM GOAL #4   Title Pt will  improve 6 MWT >2094ft to demonstrate improved cardiorespiratory fitness    Baseline Deferred to next session: 04/29/2018; 05/01/2018: 1483ft; 08/21/2018: 1456ft; 09/19/2018: 141ft; 10/21/2018 1621; 11/21/2018 deferred to next session 2/2 time.; 01/08/2019: 1480 ft; 02/25/2019: 1335ft; 04/10/2019: 1400 ft, 05/13/19: 1321 ft; 07/21/2019: 1170 ft; 09/17/2019: 1274ft; 11/17/2019: 1215ft; 01/13/20: 4782; 03/15/20: 1270ft    Time 4    Period Weeks    Status On-going      PT LONG TERM GOAL #5   Title Patient will be able to run 222ft without stopping to better be able to return to recreational activites and address cardiovascular endurance.    Baseline 15ft; 02/25/2019: 75ft; 04/10/2019 deferred    Time 4    Period Weeks    Status Deferred      PT LONG TERM GOAL #6   Title Patient will improve L hip flexion strength to 4/5 for improved steppage gait and reduced fall risk.    Baseline 04/10/2019: L hip flexion 3+/5; 07/21/2019: L Hip 4/5    Time 6    Period Weeks    Status Achieved      PT LONG TERM GOAL #7   Title Patient will be able to lift her affected L LE to the same extent as the R LE to allow for greater amount of foot clearance when ambulating    Baseline L LE: 10", R LE: 15"; 11/17/2019: L LE: 14", R LE 17.5"; 01/13/2020: L LE: 15.5", R LE 18"; 12/20: 18" B    Time 8    Period Weeks    Status Achieved      PT LONG TERM GOAL #8   Title Patient will have full dorsiflexion with 5/5 strength to allow for greater toe clearance with the performance of exercises, most notably when walking.    Baseline L 4/5 5 degree ; R 5/5 12 degree; L 4+/5 5 degree ; R 5/5 12 degree    Time 8    Period Weeks    Status Achieved      PT LONG TERM GOAL  #9   TITLE Patient will have no scuffing along L LE with performance of 6 min WT to indicate                 Plan - 04/07/20 1746    Clinical Impression Statement Focused on performing exercises to address glute weakness. Patient  demonstrates difficulty with  performing deadlifts requiring frequent cueing to perform with proper form indicating decreased form/technique. Although patient is improving, she demonstrates significant weakness with performing hip extension exercises. Patient will benefit from further skilled therapy return to prior level of function.    Personal Factors and Comorbidities Past/Current Experience    Examination-Activity Limitations Locomotion Level    Examination-Participation Restrictions Community Activity    Stability/Clinical Decision Making Stable/Uncomplicated    Rehab Potential Good    Clinical Impairments Affecting Rehab Potential (-) hx of MS; (+) good motivation    PT Frequency 2x / week    PT Duration 4 weeks    PT Treatment/Interventions Moist Heat;Iontophoresis 4mg /ml Dexamethasone;Electrical Stimulation;Aquatic Therapy;Cryotherapy;Therapeutic activities;Neuromuscular re-education;Therapeutic exercise;Patient/family education;Dry needling;Manual techniques;Passive range of motion;Balance training;Functional mobility training;Gait training;Stair training;Joint Manipulations;Prosthetic Training    PT Next Visit Plan Dynamic balance and LE strength exercise    PT Home Exercise Plan hip flexion in sitting for strengthening, hip flexion knee flexion in standing for balance, bike intervals at 70%-75% max HR    Consulted and Agree with Plan of Care Patient           Patient will benefit from skilled therapeutic intervention in order to improve the following deficits and impairments:  Pain,Decreased coordination,Impaired perceived functional ability,Increased fascial restricitons,Increased muscle spasms,Difficulty walking,Abnormal gait,Decreased balance,Decreased mobility,Impaired sensation,Increased edema  Visit Diagnosis: Difficulty in walking, not elsewhere classified  Muscle weakness (generalized)  History of falling     Problem List There are no problems to display for this patient.   [name], PT  DPT 04/08/2020, 8:05 AM  [facility] [address] [place], [address] Phone: [phone]   Fax:  [phone]  Name: Michelle Ruiz MRN: [id number] Date of Birth: [birth year]

## 2020-04-15 ENCOUNTER — Ambulatory Visit: Payer: BC Managed Care – PPO

## 2020-04-29 ENCOUNTER — Ambulatory Visit: Payer: BC Managed Care – PPO | Attending: Neurology

## 2020-04-29 ENCOUNTER — Other Ambulatory Visit: Payer: Self-pay

## 2020-04-29 DIAGNOSIS — Z9181 History of falling: Secondary | ICD-10-CM | POA: Diagnosis present

## 2020-04-29 DIAGNOSIS — M6281 Muscle weakness (generalized): Secondary | ICD-10-CM | POA: Diagnosis present

## 2020-04-29 DIAGNOSIS — R262 Difficulty in walking, not elsewhere classified: Secondary | ICD-10-CM | POA: Diagnosis present

## 2020-04-29 NOTE — Therapy (Signed)
[facility] [address] [place], [address] Phone: [phone]   Fax:  [phone]  Physical Therapy Treatment  Patient Details  Name: Michelle Ruiz MRN: [id number] Date of Birth: 1970/07/02 No data recorded  Encounter Date: 04/29/2020   PT End of Session - 04/29/20 1746    Visit Number 118    Number of Visits 124    Date for PT Re-Evaluation 06/08/19    Authorization Type 1/10    PT Start Time 1740    PT Stop Time 1825    PT Time Calculation (min) 45 min    Equipment Utilized During Treatment Gait belt    Activity Tolerance Patient tolerated treatment well;No increased pain;Other (comment)    Behavior During Therapy [facility] for tasks assessed/performed;Anxious           Past Medical History:  Diagnosis Date  . Abnormal Pap smear of cervix   . Actinic keratosis   . Anxiety   . Basal cell carcinoma 04/14/2008   Right upper ant. thigh. BCC with sclerosis.  . Basal cell carcinoma 08/14/2008   Right lat. lower thigh. Superficial.   . Basal cell carcinoma 04/29/2019   Right mid forearm. Superficial and nodular patterns. EDC.  Marland Kitchen Kidney stones   . MS (multiple sclerosis) ([place])   . UTI (urinary tract infection)     Past Surgical History:  Procedure Laterality Date  . BREAST BIOPSY Right 2010   stereotactic biopsy/ neg/ dr.byrnett  . BREAST BIOPSY Left 2012   neg/dr. brynett  . CESAREAN SECTION  2008   RPH  . TONSILLECTOMY  2006    There were no vitals filed for this visit.   Subjective Assessment - 04/29/20 1742    Subjective Patient reports "that it has been far too long and is completely out of shape". Patient denies falls.    Patient is accompained by: Family member    Pertinent History Patient reports history of falls x3 in the past 6 months secondary to drop foot on the L LE. PMH: Multiple sclerosis; elbow fracture of the R UE    Limitations Standing;Walking;Lifting    How long can you  stand comfortably? 20 min    How long can you walk comfortably? 1 miles    Patient Stated Goals To be more balanced and core strengthening     Currently in Pain? No/denies    Pain Onset More than a month ago            TREATMENT Therapeutic Exercise Deadlifts with 30# kb - 2 x 7 Hip swings forward/backward/ laterally  -- 2  min each side Stairs without UE support - x 9 (4 stairs) Sit to stands with use of UE with TRX -  x 20 with long sitting rest break Step taps onto onto gray chair - x 20 Monster walks with double YTB - 41ft x 2 B     Performed exercises to improve LE strengthening       PT Education - 04/29/20 1745    Education Details form/technique with exercise    Person(s) Educated Patient    Methods Explanation;Demonstration    Comprehension Verbalized understanding;Returned demonstration               PT Long Term Goals - 03/15/20 1824      PT LONG TERM GOAL #1   Title Patient will be independent with balance HEP to continue benefits of therapy after discharge.  Baseline dependent with form/technique for balance exercise; 03/25/2018: Independent     Time 6    Period Weeks    Status Achieved      PT LONG TERM GOAL #2   Title Patient will improve FGA by 4 points to indicate significant improvement with balancing while ambulating and decrease of fall risk    Baseline 03/25/2018: 28/30; 04/10/2019: 22; 07/21/2019: 29; 09/17/2019: 30/30    Time 6    Period Weeks    Status Achieved      PT LONG TERM GOAL #3   Title Patient will be able to perform prolonged single leg stance >10sec with eyes closed to indicate improvement with static balance and decrease fall risk    Baseline 2 sec in standing EC; 03/25/2018: 3 sec; 04/29/2018: 4 sec; 08/21/2018: 5 sec; 09/19/2018: 6 sec ; 10/21/2018 3 sec; 11/21/2018 5 sec; 01/08/2019: 9 sec; 02/25/2019: 14sec    Time 4    Period Weeks    Status Achieved      PT LONG TERM GOAL #4   Title Pt will improve 6 MWT >2057ft to  demonstrate improved cardiorespiratory fitness    Baseline Deferred to next session: 04/29/2018; 05/01/2018: 1426ft; 08/21/2018: 1422ft; 09/19/2018: 1421ft; 10/21/2018 1621; 11/21/2018 deferred to next session 2/2 time.; 01/08/2019: 1480 ft; 02/25/2019: 1372ft; 04/10/2019: 1400 ft, 05/13/19: 1321 ft; 07/21/2019: 1170 ft; 09/17/2019: 1248ft; 11/17/2019: 1227ft; 01/13/20: [id number]; 03/15/20: 1285ft    Time 4    Period Weeks    Status On-going      PT LONG TERM GOAL #5   Title Patient will be able to run 230ft without stopping to better be able to return to recreational activites and address cardiovascular endurance.    Baseline 49ft; 02/25/2019: 14ft; 04/10/2019 deferred    Time 4    Period Weeks    Status Deferred      PT LONG TERM GOAL #6   Title Patient will improve L hip flexion strength to 4/5 for improved steppage gait and reduced fall risk.    Baseline 04/10/2019: L hip flexion 3+/5; 07/21/2019: L Hip 4/5    Time 6    Period Weeks    Status Achieved      PT LONG TERM GOAL #7   Title Patient will be able to lift her affected L LE to the same extent as the R LE to allow for greater amount of foot clearance when ambulating    Baseline L LE: 10", R LE: 15"; 11/17/2019: L LE: 14", R LE 17.5"; 01/13/2020: L LE: 15.5", R LE 18"; 12/20: 18" B    Time 8    Period Weeks    Status Achieved      PT LONG TERM GOAL #8   Title Patient will have full dorsiflexion with 5/5 strength to allow for greater toe clearance with the performance of exercises, most notably when walking.    Baseline L 4/5 5 degree ; R 5/5 12 degree; L 4+/5 5 degree ; R 5/5 12 degree    Time 8    Period Weeks    Status Achieved      PT LONG TERM GOAL  #9   TITLE Patient will have no scuffing along L LE with performance of 6 min WT to indicate                 Plan - 04/29/20 1803    Clinical Impression Statement Patient demonstrates improvement with deadlift activities today as she was able to perform greater amount  of repetitions  before requiring a sitting rest break. Patient also able to perform greater amount of activities of higher level balance and resistant activity. Patient continues to have intermittent foot scuffing during session. Patient will benefit from furhter skilled therapy to return to prior level of function.    Personal Factors and Comorbidities Past/Current Experience    Examination-Activity Limitations Locomotion Level    Examination-Participation Restrictions Community Activity    Stability/Clinical Decision Making Stable/Uncomplicated    Rehab Potential Good    Clinical Impairments Affecting Rehab Potential (-) hx of MS; (+) good motivation    PT Frequency 2x / week    PT Duration 4 weeks    PT Treatment/Interventions Moist Heat;Iontophoresis 4mg /ml Dexamethasone;Electrical Stimulation;Aquatic Therapy;Cryotherapy;Therapeutic activities;Neuromuscular re-education;Therapeutic exercise;Patient/family education;Dry needling;Manual techniques;Passive range of motion;Balance training;Functional mobility training;Gait training;Stair training;Joint Manipulations;Prosthetic Training    PT Next Visit Plan Dynamic balance and LE strength exercise    PT Home Exercise Plan hip flexion in sitting for strengthening, hip flexion knee flexion in standing for balance, bike intervals at 70%-75% max HR    Consulted and Agree with Plan of Care Patient           Patient will benefit from skilled therapeutic intervention in order to improve the following deficits and impairments:  Pain,Decreased coordination,Impaired perceived functional ability,Increased fascial restricitons,Increased muscle spasms,Difficulty walking,Abnormal gait,Decreased balance,Decreased mobility,Impaired sensation,Increased edema  Visit Diagnosis: Difficulty in walking, not elsewhere classified  Muscle weakness (generalized)  History of falling     Problem List There are no problems to display for this patient.   [place]  Michelle Ruiz 04/29/2020, 7:57 PM  [facility] [address], [place], [address] Phone: [phone]   Fax:  [phone]  Name: Michelle Ruiz MRN: [id number] Date of Birth: Sep 21, 1970

## 2020-05-06 ENCOUNTER — Ambulatory Visit (INDEPENDENT_AMBULATORY_CARE_PROVIDER_SITE_OTHER): Payer: BC Managed Care – PPO

## 2020-05-06 ENCOUNTER — Other Ambulatory Visit: Payer: Self-pay

## 2020-05-06 DIAGNOSIS — L570 Actinic keratosis: Secondary | ICD-10-CM

## 2020-05-06 MED ORDER — AMINOLEVULINIC ACID HCL 20 % EX SOLR
1.0000 "application " | Freq: Once | CUTANEOUS | Status: AC
  Administered 2020-05-06: 354 mg via TOPICAL

## 2020-05-06 NOTE — Patient Instructions (Signed)

## 2020-05-06 NOTE — Progress Notes (Signed)
1. AK (actinic keratosis) Head - Anterior (Face)  Photodynamic therapy - Head - Anterior (Face) Procedure discussed: discussed risks, benefits, side effects. and alternatives   Prep: site scrubbed/prepped with acetone   Location:  Face Number of lesions:  Multiple Type of treatment:  Blue light Aminolevulinic Acid (see MAR for details): Levulan Number of Levulan sticks used:  1 Incubation time (minutes):  60 Number of minutes under lamp:  16 Number of seconds under lamp:  40 Cooling:  Floor fan Outcome: patient tolerated procedure well with no complications   Post-procedure details: sunscreen applied and aftercare instructions given to patient    Aminolevulinic Acid HCl 20 % SOLR 354 mg - Head - Anterior (Face)      

## 2020-05-12 ENCOUNTER — Other Ambulatory Visit: Payer: Self-pay

## 2020-05-12 ENCOUNTER — Ambulatory Visit: Payer: BC Managed Care – PPO

## 2020-05-12 DIAGNOSIS — R262 Difficulty in walking, not elsewhere classified: Secondary | ICD-10-CM

## 2020-05-12 DIAGNOSIS — Z9181 History of falling: Secondary | ICD-10-CM

## 2020-05-12 DIAGNOSIS — M6281 Muscle weakness (generalized): Secondary | ICD-10-CM

## 2020-05-12 NOTE — Therapy (Signed)
[facility] [address], [place], [address] Phone: [phone]   Fax:  [phone]  Physical Therapy Treatment  Patient Details  Name: Michelle Ruiz MRN: [id number] Date of Birth: Jan 01, 1971 No data recorded  Encounter Date: 05/12/2020   PT End of Session - 05/12/20 1930    Visit Number 119    Number of Visits 124    Date for PT Re-Evaluation 06/08/19    Authorization Type 2/10    PT Start Time 1645    PT Stop Time 1730    PT Time Calculation (min) 45 min    Equipment Utilized During Treatment Gait belt    Activity Tolerance Patient tolerated treatment well;No increased pain;Other (comment)    Behavior During Therapy [facility] for tasks assessed/performed;Anxious           Past Medical History:  Diagnosis Date  . Abnormal Pap smear of cervix   . Actinic keratosis   . Anxiety   . Basal cell carcinoma 04/14/2008   Right upper ant. thigh. BCC with sclerosis.  . Basal cell carcinoma 08/14/2008   Right lat. lower thigh. Superficial.   . Basal cell carcinoma 04/29/2019   Right mid forearm. Superficial and nodular patterns. EDC.  Marland Kitchen Kidney stones   . MS (multiple sclerosis) ([place])   . UTI (urinary tract infection)     Past Surgical History:  Procedure Laterality Date  . BREAST BIOPSY Right 2010   stereotactic biopsy/ neg/ dr.byrnett  . BREAST BIOPSY Left 2012   neg/dr. brynett  . CESAREAN SECTION  2008   RPH  . TONSILLECTOMY  2006    There were no vitals filed for this visit.   Subjective Assessment - 05/12/20 1928    Subjective Pt reports she experiences fatigue/weakness, L LE feels weaker than R.  She has been walking in the evenings in her neighborhood.    Patient is accompained by: Family member    Pertinent History Patient reports history of falls x3 in the past 6 months secondary to drop foot on the L LE. PMH: Multiple sclerosis; elbow fracture of the R UE    Limitations  Standing;Walking;Lifting    How long can you stand comfortably? 20 min    How long can you walk comfortably? 1 miles    Patient Stated Goals To be more balanced and core strengthening     Pain Onset More than a month ago           TREATMENT Therapeutic Exercise Deadlifts with 30# kb - 2 x 10 Hip swings forward/backward/ laterally  -- 2  min each side Stairs without UE support - x 9 (4 stairs) Sit to stands with use of UE with TRX -  x 20 with long sitting rest break Step taps onto onto high stool while standing on airex: alternating LE taps 2x20 Monster walks with double RTB - 68ft x 2 B Seated on physioball, med ball obliques 4x10         PT Education - 05/12/20 1929    Education Details exercise technique/pacing    Person(s) Educated Patient    Comprehension Verbalized understanding               PT Long Term Goals - 03/15/20 1824      PT LONG TERM GOAL #1   Title Patient will be independent with balance HEP to continue benefits of therapy after discharge.    Baseline dependent with form/technique for balance exercise;  03/25/2018: Independent     Time 6    Period Weeks    Status Achieved      PT LONG TERM GOAL #2   Title Patient will improve FGA by 4 points to indicate significant improvement with balancing while ambulating and decrease of fall risk    Baseline 03/25/2018: 28/30; 04/10/2019: 22; 07/21/2019: 29; 09/17/2019: 30/30    Time 6    Period Weeks    Status Achieved      PT LONG TERM GOAL #3   Title Patient will be able to perform prolonged single leg stance >10sec with eyes closed to indicate improvement with static balance and decrease fall risk    Baseline 2 sec in standing EC; 03/25/2018: 3 sec; 04/29/2018: 4 sec; 08/21/2018: 5 sec; 09/19/2018: 6 sec ; 10/21/2018 3 sec; 11/21/2018 5 sec; 01/08/2019: 9 sec; 02/25/2019: 14sec    Time 4    Period Weeks    Status Achieved      PT LONG TERM GOAL #4   Title Pt will improve 6 MWT >2079ft to demonstrate  improved cardiorespiratory fitness    Baseline Deferred to next session: 04/29/2018; 05/01/2018: 1430ft; 08/21/2018: 148ft; 09/19/2018: 1482ft; 10/21/2018 1621; 11/21/2018 deferred to next session 2/2 time.; 01/08/2019: 1480 ft; 02/25/2019: 1345ft; 04/10/2019: 1400 ft, 05/13/19: 1321 ft; 07/21/2019: 1170 ft; 09/17/2019: 1283ft; 11/17/2019: 124ft; 01/13/20: 7209; 03/15/20: 1278ft    Time 4    Period Weeks    Status On-going      PT LONG TERM GOAL #5   Title Patient will be able to run 270ft without stopping to better be able to return to recreational activites and address cardiovascular endurance.    Baseline 70ft; 02/25/2019: 35ft; 04/10/2019 deferred    Time 4    Period Weeks    Status Deferred      PT LONG TERM GOAL #6   Title Patient will improve L hip flexion strength to 4/5 for improved steppage gait and reduced fall risk.    Baseline 04/10/2019: L hip flexion 3+/5; 07/21/2019: L Hip 4/5    Time 6    Period Weeks    Status Achieved      PT LONG TERM GOAL #7   Title Patient will be able to lift her affected L LE to the same extent as the R LE to allow for greater amount of foot clearance when ambulating    Baseline L LE: 10", R LE: 15"; 11/17/2019: L LE: 14", R LE 17.5"; 01/13/2020: L LE: 15.5", R LE 18"; 12/20: 18" B    Time 8    Period Weeks    Status Achieved      PT LONG TERM GOAL #8   Title Patient will have full dorsiflexion with 5/5 strength to allow for greater toe clearance with the performance of exercises, most notably when walking.    Baseline L 4/5 5 degree ; R 5/5 12 degree; L 4+/5 5 degree ; R 5/5 12 degree    Time 8    Period Weeks    Status Achieved      PT LONG TERM GOAL  #9   TITLE Patient will have no scuffing along L LE with performance of 6 min WT to indicate                 Plan - 05/12/20 1931    Clinical Impression Statement Pt was challenged with deadlift again today; L LE fatigue noted at end of session.  Overall, she should cont to benefit  from skilled  PT to address strength, balance, mobility deficits and PT goals.    Personal Factors and Comorbidities Past/Current Experience    Examination-Activity Limitations Locomotion Level    Examination-Participation Restrictions Community Activity    Stability/Clinical Decision Making Stable/Uncomplicated    Rehab Potential Good    Clinical Impairments Affecting Rehab Potential (-) hx of MS; (+) good motivation    PT Frequency 2x / week    PT Duration 4 weeks    PT Treatment/Interventions Moist Heat;Iontophoresis 4mg /ml Dexamethasone;Electrical Stimulation;Aquatic Therapy;Cryotherapy;Therapeutic activities;Neuromuscular re-education;Therapeutic exercise;Patient/family education;Dry needling;Manual techniques;Passive range of motion;Balance training;Functional mobility training;Gait training;Stair training;Joint Manipulations;Prosthetic Training    PT Next Visit Plan Dynamic balance and LE strength exercise    PT Home Exercise Plan hip flexion in sitting for strengthening, hip flexion knee flexion in standing for balance, bike intervals at 70%-75% max HR    Consulted and Agree with Plan of Care Patient           Patient will benefit from skilled therapeutic intervention in order to improve the following deficits and impairments:  Pain,Decreased coordination,Impaired perceived functional ability,Increased fascial restricitons,Increased muscle spasms,Difficulty walking,Abnormal gait,Decreased balance,Decreased mobility,Impaired sensation,Increased edema  Visit Diagnosis: Difficulty in walking, not elsewhere classified  Muscle weakness (generalized)  History of falling     Problem List There are no problems to display for this patient.   Pincus Badder 05/12/2020, 7:39 PM Merdis Delay, PT, DPT    [facility] [address] [place], [address] Phone: [phone]   Fax:  [phone]  Name: Michelle Ruiz MRN:  [id number] Date of Birth: 07-31-1970

## 2020-05-14 ENCOUNTER — Other Ambulatory Visit: Payer: Self-pay | Admitting: Internal Medicine

## 2020-05-14 DIAGNOSIS — R079 Chest pain, unspecified: Secondary | ICD-10-CM

## 2020-05-19 ENCOUNTER — Other Ambulatory Visit: Payer: Self-pay

## 2020-05-19 ENCOUNTER — Ambulatory Visit: Payer: BC Managed Care – PPO

## 2020-05-19 DIAGNOSIS — R262 Difficulty in walking, not elsewhere classified: Secondary | ICD-10-CM

## 2020-05-19 DIAGNOSIS — M6281 Muscle weakness (generalized): Secondary | ICD-10-CM

## 2020-05-20 NOTE — Therapy (Signed)
[facility] [address] [place], [address] Phone: [phone]   Fax:  [phone]  Physical Therapy Treatment  Patient Details  Name: KARRY BARRILLEAUX MRN: [id number] Date of Birth: [birth year] No data recorded  Encounter Date: 05/19/2020   PT End of Session - 05/20/20 1136    Visit Number 120    Number of Visits 124    Date for PT Re-Evaluation 06/08/19    Authorization Type 3/10    PT Start Time 6962    PT Stop Time 1800    PT Time Calculation (min) 45 min    Equipment Utilized During Treatment Gait belt    Activity Tolerance Patient tolerated treatment well;No increased pain;Other (comment)    Behavior During Therapy [facility] for tasks assessed/performed;Anxious           Past Medical History:  Diagnosis Date  . Abnormal Pap smear of cervix   . Actinic keratosis   . Anxiety   . Basal cell carcinoma 04/14/2008   Right upper ant. thigh. BCC with sclerosis.  . Basal cell carcinoma 08/14/2008   Right lat. lower thigh. Superficial.   . Basal cell carcinoma 04/29/2019   Right mid forearm. Superficial and nodular patterns. EDC.  Marland Kitchen Kidney stones   . MS (multiple sclerosis) ([place])   . UTI (urinary tract infection)     Past Surgical History:  Procedure Laterality Date  . BREAST BIOPSY Right 2010   stereotactic biopsy/ neg/ dr.byrnett  . BREAST BIOPSY Left 2012   neg/dr. brynett  . CESAREAN SECTION  2008   RPH  . TONSILLECTOMY  2006    There were no vitals filed for this visit.   Subjective Assessment - 05/19/20 1723    Subjective Patient states she has been experiencing increased pain along the L aspect of the chest (along ribs 4-6). Patient states she has increased pain    Patient is accompained by: Family member    Pertinent History Patient reports history of falls x3 in the past 6 months secondary to drop foot on the L LE. PMH: Multiple sclerosis; elbow fracture of the R UE    Limitations  Standing;Walking;Lifting    How long can you stand comfortably? 20 min    How long can you walk comfortably? 1 miles    Patient Stated Goals To be more balanced and core strengthening     Pain Onset More than a month ago             TREATMENT Therapeutic Exercise Sit to stand with use of TRX straps - x 20  Push up plus at wall - x 20  Shoulder flexion in standing - x 20  Quad stretch in standing - 30sec holds - x 5 Chest press at [facility] 15# B - 2 x 15 Thoracic rotation in sitting to decrease pain - x 20 Performed to address LE and chest pain      PT Education - 05/20/20 1136    Education Details form/technique with exercise    Person(s) Educated Patient    Methods Explanation;Demonstration    Comprehension Verbalized understanding;Returned demonstration               PT Long Term Goals - 03/15/20 1824      PT LONG TERM GOAL #1   Title Patient will be independent with balance HEP to continue benefits of therapy after discharge.    Baseline dependent with form/technique for balance exercise; 03/25/2018: Independent  Time 6    Period Weeks    Status Achieved      PT LONG TERM GOAL #2   Title Patient will improve FGA by 4 points to indicate significant improvement with balancing while ambulating and decrease of fall risk    Baseline 03/25/2018: 28/30; 04/10/2019: 22; 07/21/2019: 29; 09/17/2019: 30/30    Time 6    Period Weeks    Status Achieved      PT LONG TERM GOAL #3   Title Patient will be able to perform prolonged single leg stance >10sec with eyes closed to indicate improvement with static balance and decrease fall risk    Baseline 2 sec in standing EC; 03/25/2018: 3 sec; 04/29/2018: 4 sec; 08/21/2018: 5 sec; 09/19/2018: 6 sec ; 10/21/2018 3 sec; 11/21/2018 5 sec; 01/08/2019: 9 sec; 02/25/2019: 14sec    Time 4    Period Weeks    Status Achieved      PT LONG TERM GOAL #4   Title Pt will improve 6 MWT >2047ft to demonstrate improved cardiorespiratory fitness     Baseline Deferred to next session: 04/29/2018; 05/01/2018: 1448ft; 08/21/2018: 1469ft; 09/19/2018: 1479ft; 10/21/2018 1621; 11/21/2018 deferred to next session 2/2 time.; 01/08/2019: 1480 ft; 02/25/2019: 1335ft; 04/10/2019: 1400 ft, 05/13/19: 1321 ft; 07/21/2019: 1170 ft; 09/17/2019: 1281ft; 11/17/2019: 125ft; 01/13/20: 1353; 03/15/20: 1236ft    Time 4    Period Weeks    Status On-going      PT LONG TERM GOAL #5   Title Patient will be able to run 266ft without stopping to better be able to return to recreational activites and address cardiovascular endurance.    Baseline 59ft; 02/25/2019: 52ft; 04/10/2019 deferred    Time 4    Period Weeks    Status Deferred      PT LONG TERM GOAL #6   Title Patient will improve L hip flexion strength to 4/5 for improved steppage gait and reduced fall risk.    Baseline 04/10/2019: L hip flexion 3+/5; 07/21/2019: L Hip 4/5    Time 6    Period Weeks    Status Achieved      PT LONG TERM GOAL #7   Title Patient will be able to lift her affected L LE to the same extent as the R LE to allow for greater amount of foot clearance when ambulating    Baseline L LE: 10", R LE: 15"; 11/17/2019: L LE: 14", R LE 17.5"; 01/13/2020: L LE: 15.5", R LE 18"; 12/20: 18" B    Time 8    Period Weeks    Status Achieved      PT LONG TERM GOAL #8   Title Patient will have full dorsiflexion with 5/5 strength to allow for greater toe clearance with the performance of exercises, most notably when walking.    Baseline L 4/5 5 degree ; R 5/5 12 degree; L 4+/5 5 degree ; R 5/5 12 degree    Time 8    Period Weeks    Status Achieved      PT LONG TERM GOAL  #9   TITLE Patient will have no scuffing along L LE with performance of 6 min WT to indicate                 Plan - 05/20/20 1137    Clinical Impression Statement Patient returns and c/o increased L sided chest pain aggravated by scapular protraction and throacic rotation; radiates posterior to the posterior aspect of the thoracic  spine. Patient  given exercises to manage symptoms and then focus on performing exercises to address LE weakness and pain. Gave quad stretch to be performed at home, patient will benefit form further skilled therapy to return to prior level of function.    Personal Factors and Comorbidities Past/Current Experience    Examination-Activity Limitations Locomotion Level    Examination-Participation Restrictions Community Activity    Stability/Clinical Decision Making Stable/Uncomplicated    Rehab Potential Good    Clinical Impairments Affecting Rehab Potential (-) hx of MS; (+) good motivation    PT Frequency 2x / week    PT Duration 4 weeks    PT Treatment/Interventions Moist Heat;Iontophoresis 4mg /ml Dexamethasone;Electrical Stimulation;Aquatic Therapy;Cryotherapy;Therapeutic activities;Neuromuscular re-education;Therapeutic exercise;Patient/family education;Dry needling;Manual techniques;Passive range of motion;Balance training;Functional mobility training;Gait training;Stair training;Joint Manipulations;Prosthetic Training    PT Next Visit Plan Dynamic balance and LE strength exercise    PT Home Exercise Plan hip flexion in sitting for strengthening, hip flexion knee flexion in standing for balance, bike intervals at 70%-75% max HR    Consulted and Agree with Plan of Care Patient           Patient will benefit from skilled therapeutic intervention in order to improve the following deficits and impairments:  Pain,Decreased coordination,Impaired perceived functional ability,Increased fascial restricitons,Increased muscle spasms,Difficulty walking,Abnormal gait,Decreased balance,Decreased mobility,Impaired sensation,Increased edema  Visit Diagnosis: Difficulty in walking, not elsewhere classified  Muscle weakness (generalized)     Problem List There are no problems to display for this patient.   [name], PT DPT 05/20/2020, 1:29 PM  [facility] [address] [place], [address] Phone: [phone]   Fax:  [phone]  Name: DONICIA DRUCK MRN: [id number] Date of Birth: June 02, 1970

## 2020-05-26 ENCOUNTER — Ambulatory Visit: Payer: BC Managed Care – PPO | Attending: Neurology

## 2020-05-26 ENCOUNTER — Other Ambulatory Visit: Payer: Self-pay

## 2020-05-26 DIAGNOSIS — R262 Difficulty in walking, not elsewhere classified: Secondary | ICD-10-CM | POA: Insufficient documentation

## 2020-05-26 DIAGNOSIS — M6281 Muscle weakness (generalized): Secondary | ICD-10-CM | POA: Diagnosis present

## 2020-05-26 DIAGNOSIS — Z9181 History of falling: Secondary | ICD-10-CM | POA: Diagnosis present

## 2020-05-27 ENCOUNTER — Ambulatory Visit
Admission: RE | Admit: 2020-05-27 | Discharge: 2020-05-27 | Disposition: A | Discharge status: Home / Self Care | Payer: BC Managed Care – PPO | Source: Ambulatory Visit | Attending: Internal Medicine | Admitting: Internal Medicine

## 2020-05-27 DIAGNOSIS — R079 Chest pain, unspecified: Secondary | ICD-10-CM | POA: Diagnosis present

## 2020-05-27 NOTE — Therapy (Signed)
[facility] [address], [place], [address] Phone: [phone]   Fax:  [phone]  Physical Therapy Treatment  Patient Details  Name: Michelle Ruiz MRN: [id number] Date of Birth: 1971-01-04 No data recorded  Encounter Date: 05/26/2020   PT End of Session - 05/26/20 1659    Visit Number 121    Number of Visits 124    Date for PT Re-Evaluation 06/08/19    Authorization Type 4/10    PT Start Time 1645    PT Stop Time 1730    PT Time Calculation (min) 45 min    Equipment Utilized During Treatment Gait belt    Activity Tolerance Patient tolerated treatment well;No increased pain;Other (comment)    Behavior During Therapy [facility] for tasks assessed/performed;Anxious           Past Medical History:  Diagnosis Date  . Abnormal Pap smear of cervix   . Actinic keratosis   . Anxiety   . Basal cell carcinoma 04/14/2008   Right upper ant. thigh. BCC with sclerosis.  . Basal cell carcinoma 08/14/2008   Right lat. lower thigh. Superficial.   . Basal cell carcinoma 04/29/2019   Right mid forearm. Superficial and nodular patterns. EDC.  Marland Kitchen Kidney stones   . MS (multiple sclerosis) ([place])   . UTI (urinary tract infection)     Past Surgical History:  Procedure Laterality Date  . BREAST BIOPSY Right 2010   stereotactic biopsy/ neg/ dr.byrnett  . BREAST BIOPSY Left 2012   neg/dr. brynett  . CESAREAN SECTION  2008   RPH  . TONSILLECTOMY  2006    There were no vitals filed for this visit.   Subjective Assessment - 05/26/20 1655    Subjective Patient states she has been having tightness and increased soreness/pain along hip adductors B. Patient states she continues to have the chest pain, however it is improving overall.    Patient is accompained by: Family member    Pertinent History Patient reports history of falls x3 in the past 6 months secondary to drop foot on the L LE. PMH: Multiple sclerosis; elbow  fracture of the R UE    Limitations Standing;Walking;Lifting    How long can you stand comfortably? 20 min    How long can you walk comfortably? 1 miles    Patient Stated Goals To be more balanced and core strengthening     Currently in Pain? No/denies    Pain Onset More than a month ago              TREATMENT Therapeutic Exercise Hip adduction with the LE straight in sitting - x20 against ball Hip adduction at hip adduction - x 20 25# Seated knees bent hip adductors - x 20 against ball Lateral lunges in standing with UE support - x20 Hip adduction with pushing furntiture slider - x 10 B Lunges in standing - x 15   Performed exercises to address LE weakness     PT Education - 05/26/20 1659    Education Details form/technique with exercise    Person(s) Educated Patient    Methods Explanation;Demonstration    Comprehension Verbalized understanding;Returned demonstration               PT Long Term Goals - 03/15/20 1824      PT LONG TERM GOAL #1   Title Patient will be independent with balance HEP to continue benefits of therapy after discharge.    Baseline  dependent with form/technique for balance exercise; 03/25/2018: Independent     Time 6    Period Weeks    Status Achieved      PT LONG TERM GOAL #2   Title Patient will improve FGA by 4 points to indicate significant improvement with balancing while ambulating and decrease of fall risk    Baseline 03/25/2018: 28/30; 04/10/2019: 22; 07/21/2019: 29; 09/17/2019: 30/30    Time 6    Period Weeks    Status Achieved      PT LONG TERM GOAL #3   Title Patient will be able to perform prolonged single leg stance >10sec with eyes closed to indicate improvement with static balance and decrease fall risk    Baseline 2 sec in standing EC; 03/25/2018: 3 sec; 04/29/2018: 4 sec; 08/21/2018: 5 sec; 09/19/2018: 6 sec ; 10/21/2018 3 sec; 11/21/2018 5 sec; 01/08/2019: 9 sec; 02/25/2019: 14sec    Time 4    Period Weeks    Status Achieved       PT LONG TERM GOAL #4   Title Pt will improve 6 MWT >2025ft to demonstrate improved cardiorespiratory fitness    Baseline Deferred to next session: 04/29/2018; 05/01/2018: 1453ft; 08/21/2018: 144ft; 09/19/2018: 1490ft; 10/21/2018 1621; 11/21/2018 deferred to next session 2/2 time.; 01/08/2019: 1480 ft; 02/25/2019: 1311ft; 04/10/2019: 1400 ft, 05/13/19: 1321 ft; 07/21/2019: 1170 ft; 09/17/2019: 1265ft; 11/17/2019: 1253ft; 01/13/20: 9833; 03/15/20: 1210ft    Time 4    Period Weeks    Status On-going      PT LONG TERM GOAL #5   Title Patient will be able to run 214ft without stopping to better be able to return to recreational activites and address cardiovascular endurance.    Baseline 66ft; 02/25/2019: 71ft; 04/10/2019 deferred    Time 4    Period Weeks    Status Deferred      PT LONG TERM GOAL #6   Title Patient will improve L hip flexion strength to 4/5 for improved steppage gait and reduced fall risk.    Baseline 04/10/2019: L hip flexion 3+/5; 07/21/2019: L Hip 4/5    Time 6    Period Weeks    Status Achieved      PT LONG TERM GOAL #7   Title Patient will be able to lift her affected L LE to the same extent as the R LE to allow for greater amount of foot clearance when ambulating    Baseline L LE: 10", R LE: 15"; 11/17/2019: L LE: 14", R LE 17.5"; 01/13/2020: L LE: 15.5", R LE 18"; 12/20: 18" B    Time 8    Period Weeks    Status Achieved      PT LONG TERM GOAL #8   Title Patient will have full dorsiflexion with 5/5 strength to allow for greater toe clearance with the performance of exercises, most notably when walking.    Baseline L 4/5 5 degree ; R 5/5 12 degree; L 4+/5 5 degree ; R 5/5 12 degree    Time 8    Period Weeks    Status Achieved      PT LONG TERM GOAL  #9   TITLE Patient will have no scuffing along L LE with performance of 6 min WT to indicate                 Plan - 05/26/20 1701    Clinical Impression Statement Performed exercises to address hip adduction  strength and AROM. Patient demonstrates improvement with ability to  performexercises throuhgout greater AROM and with less pain at the end of the session. Patient with no increase in pain at the end of the session and performed LE strengthening exercises to further address limitations. Patient will benefit form further skilled therapy to improve functional capacity.    Personal Factors and Comorbidities Past/Current Experience    Examination-Activity Limitations Locomotion Level    Examination-Participation Restrictions Community Activity    Stability/Clinical Decision Making Stable/Uncomplicated    Rehab Potential Good    Clinical Impairments Affecting Rehab Potential (-) hx of MS; (+) good motivation    PT Frequency 2x / week    PT Duration 4 weeks    PT Treatment/Interventions Moist Heat;Iontophoresis 4mg /ml Dexamethasone;Electrical Stimulation;Aquatic Therapy;Cryotherapy;Therapeutic activities;Neuromuscular re-education;Therapeutic exercise;Patient/family education;Dry needling;Manual techniques;Passive range of motion;Balance training;Functional mobility training;Gait training;Stair training;Joint Manipulations;Prosthetic Training    PT Next Visit Plan Dynamic balance and LE strength exercise    PT Home Exercise Plan hip flexion in sitting for strengthening, hip flexion knee flexion in standing for balance, bike intervals at 70%-75% max HR    Consulted and Agree with Plan of Care Patient           Patient will benefit from skilled therapeutic intervention in order to improve the following deficits and impairments:  Pain,Decreased coordination,Impaired perceived functional ability,Increased fascial restricitons,Increased muscle spasms,Difficulty walking,Abnormal gait,Decreased balance,Decreased mobility,Impaired sensation,Increased edema  Visit Diagnosis: Difficulty in walking, not elsewhere classified  Muscle weakness (generalized)  History of falling     Problem List There are no  problems to display for this patient.   [name], PT DPT 05/27/2020, 11:27 AM  [facility] [address] [place], [address] Phone: [phone]   Fax:  [phone]  Name: Michelle Ruiz MRN: [id number] Date of Birth: 11-Aug-1970

## 2020-06-03 ENCOUNTER — Other Ambulatory Visit: Payer: Self-pay

## 2020-06-03 ENCOUNTER — Ambulatory Visit: Payer: BC Managed Care – PPO

## 2020-06-03 DIAGNOSIS — M6281 Muscle weakness (generalized): Secondary | ICD-10-CM

## 2020-06-03 DIAGNOSIS — R262 Difficulty in walking, not elsewhere classified: Secondary | ICD-10-CM | POA: Diagnosis not present

## 2020-06-03 NOTE — Therapy (Signed)
[place] [facility] [address] [place], [address] Phone: [phone]   Fax:  [phone]  Physical Therapy Treatment  Patient Details  Name: Michelle Ruiz MRN: [id number] Date of Birth: April 02, 1970 No data recorded  Encounter Date: 06/03/2020   PT End of Session - 06/03/20 1648    Visit Number 122    Number of Visits 124    Date for PT Re-Evaluation 06/08/19    Authorization Type 5/10    PT Start Time 1645    PT Stop Time 1730    PT Time Calculation (min) 45 min    Equipment Utilized During Treatment Gait belt    Activity Tolerance Patient tolerated treatment well;No increased pain;Other (comment)    Behavior During Therapy [facility] for tasks assessed/performed;Anxious           Past Medical History:  Diagnosis Date  . Abnormal Pap smear of cervix   . Actinic keratosis   . Anxiety   . Basal cell carcinoma 04/14/2008   Right upper ant. thigh. BCC with sclerosis.  . Basal cell carcinoma 08/14/2008   Right lat. lower thigh. Superficial.   . Basal cell carcinoma 04/29/2019   Right mid forearm. Superficial and nodular patterns. EDC.  Marland Kitchen Kidney stones   . MS (multiple sclerosis) ([place])   . UTI (urinary tract infection)     Past Surgical History:  Procedure Laterality Date  . BREAST BIOPSY Right 2010   stereotactic biopsy/ neg/ dr.byrnett  . BREAST BIOPSY Left 2012   neg/dr. brynett  . CESAREAN SECTION  2008   RPH  . TONSILLECTOMY  2006    There were no vitals filed for this visit.   Subjective Assessment - 06/03/20 1646    Subjective Patient states she went to the school last night and was fatigued after performing prolonged walking.    Patient is accompained by: Family member    Pertinent History Patient reports history of falls x3 in the past 6 months secondary to drop foot on the L LE. PMH: Multiple sclerosis; elbow fracture of the R UE    Limitations Standing;Walking;Lifting    How long can you  stand comfortably? 20 min    How long can you walk comfortably? 1 miles    Patient Stated Goals To be more balanced and core strengthening     Currently in Pain? No/denies    Pain Onset More than a month ago              TREATMENT Therapeutic Exercise Lateral lunges in standing with UE support -  2 x 10 Step ups onto 2nd step ~12"-- x 10  Lunges in standing - x 15  Hip abduction with B UE - x15, x 10 40# B  Hip extension with B UE support - x 15 , x 10 70# Hip adduction with use of slider - x 20    Performed exercises to address LE weakness    PT Education - 06/03/20 1647    Education Details form/technique with exercise    Person(s) Educated Patient    Methods Explanation;Demonstration    Comprehension Verbalized understanding;Returned demonstration               PT Long Term Goals - 03/15/20 1824      PT LONG TERM GOAL #1   Title Patient will be independent with balance HEP to continue benefits of therapy after discharge.    Baseline dependent with form/technique for balance exercise;  03/25/2018: Independent     Time 6    Period Weeks    Status Achieved      PT LONG TERM GOAL #2   Title Patient will improve FGA by 4 points to indicate significant improvement with balancing while ambulating and decrease of fall risk    Baseline 03/25/2018: 28/30; 04/10/2019: 22; 07/21/2019: 29; 09/17/2019: 30/30    Time 6    Period Weeks    Status Achieved      PT LONG TERM GOAL #3   Title Patient will be able to perform prolonged single leg stance >10sec with eyes closed to indicate improvement with static balance and decrease fall risk    Baseline 2 sec in standing EC; 03/25/2018: 3 sec; 04/29/2018: 4 sec; 08/21/2018: 5 sec; 09/19/2018: 6 sec ; 10/21/2018 3 sec; 11/21/2018 5 sec; 01/08/2019: 9 sec; 02/25/2019: 14sec    Time 4    Period Weeks    Status Achieved      PT LONG TERM GOAL #4   Title Pt will improve 6 MWT >2055ft to demonstrate improved cardiorespiratory fitness     Baseline Deferred to next session: 04/29/2018; 05/01/2018: 1480ft; 08/21/2018: 146ft; 09/19/2018: 141ft; 10/21/2018 1621; 11/21/2018 deferred to next session 2/2 time.; 01/08/2019: 1480 ft; 02/25/2019: 1324ft; 04/10/2019: 1400 ft, 05/13/19: 1321 ft; 07/21/2019: 1170 ft; 09/17/2019: 1257ft; 11/17/2019: 1256ft; 01/13/20: 4270; 03/15/20: 1214ft    Time 4    Period Weeks    Status On-going      PT LONG TERM GOAL #5   Title Patient will be able to run 263ft without stopping to better be able to return to recreational activites and address cardiovascular endurance.    Baseline 52ft; 02/25/2019: 39ft; 04/10/2019 deferred    Time 4    Period Weeks    Status Deferred      PT LONG TERM GOAL #6   Title Patient will improve L hip flexion strength to 4/5 for improved steppage gait and reduced fall risk.    Baseline 04/10/2019: L hip flexion 3+/5; 07/21/2019: L Hip 4/5    Time 6    Period Weeks    Status Achieved      PT LONG TERM GOAL #7   Title Patient will be able to lift her affected L LE to the same extent as the R LE to allow for greater amount of foot clearance when ambulating    Baseline L LE: 10", R LE: 15"; 11/17/2019: L LE: 14", R LE 17.5"; 01/13/2020: L LE: 15.5", R LE 18"; 12/20: 18" B    Time 8    Period Weeks    Status Achieved      PT LONG TERM GOAL #8   Title Patient will have full dorsiflexion with 5/5 strength to allow for greater toe clearance with the performance of exercises, most notably when walking.    Baseline L 4/5 5 degree ; R 5/5 12 degree; L 4+/5 5 degree ; R 5/5 12 degree    Time 8    Period Weeks    Status Achieved      PT LONG TERM GOAL  #9   TITLE Patient will have no scuffing along L LE with performance of 6 min WT to indicate                 Plan - 06/03/20 1700    Clinical Impression Statement Performed exercises to address hip, LE strength, and overall dynamic balance. Patient faituges quickly with exercise performance, however is able to perform  hip abduction  exercise with greater level of resistance. Patient is improving and will perform recertification during [place] visit. Patient will benefit from furhter skilled therapy to return to prior level of function.    Personal Factors and Comorbidities Past/Current Experience    Examination-Activity Limitations Locomotion Level    Examination-Participation Restrictions Community Activity    Stability/Clinical Decision Making Stable/Uncomplicated    Rehab Potential Good    Clinical Impairments Affecting Rehab Potential (-) hx of MS; (+) good motivation    PT Frequency 2x / week    PT Duration 4 weeks    PT Treatment/Interventions Moist Heat;Iontophoresis 4mg /ml Dexamethasone;Electrical Stimulation;Aquatic Therapy;Cryotherapy;Therapeutic activities;Neuromuscular re-education;Therapeutic exercise;Patient/family education;Dry needling;Manual techniques;Passive range of motion;Balance training;Functional mobility training;Gait training;Stair training;Joint Manipulations;Prosthetic Training    PT Next Visit Plan Dynamic balance and LE strength exercise    PT Home Exercise Plan hip flexion in sitting for strengthening, hip flexion knee flexion in standing for balance, bike intervals at 70%-75% max HR    Consulted and Agree with Plan of Care Patient           Patient will benefit from skilled therapeutic intervention in order to improve the following deficits and impairments:  Pain,Decreased coordination,Impaired perceived functional ability,Increased fascial restricitons,Increased muscle spasms,Difficulty walking,Abnormal gait,Decreased balance,Decreased mobility,Impaired sensation,Increased edema  Visit Diagnosis: Difficulty in walking, not elsewhere classified  Muscle weakness (generalized)     Problem List There are no problems to display for this patient.   [name], PT DPT 06/03/2020, 5:30 PM  [place] [facility] [address] [place], [address] Phone: [phone]   Fax:  [phone]  Name: Michelle Ruiz MRN: [id number] Date of Birth: Jul 09, 1970

## 2020-06-10 ENCOUNTER — Ambulatory Visit: Payer: BC Managed Care – PPO

## 2020-06-10 ENCOUNTER — Other Ambulatory Visit: Payer: Self-pay

## 2020-06-10 DIAGNOSIS — L570 Actinic keratosis: Secondary | ICD-10-CM | POA: Diagnosis not present

## 2020-06-10 MED ORDER — AMINOLEVULINIC ACID HCL 20 % EX SOLR
1.0000 "application " | Freq: Once | CUTANEOUS | Status: AC
  Administered 2020-06-10: 354 mg via TOPICAL

## 2020-06-10 NOTE — Patient Instructions (Addendum)
Levulan/PDT Treatment Common Side Effects  - Burning/stinging, which may be severe and last up to 24-72 hours after your treatment  - Redness, swelling and/or peeling which may last up to 4 weeks  - Scaling/crusting which may last up to 2 weeks  - Sun sensitivity (you MUST avoid sun exposure for 48-72 hours after treatment)  Care Instructions  - Okay to wash with soap and water and shampoo as normal  - If needed, you can do a cold compress (ex. Ice packs) for comfort  - If okay with your Primary Doctor, you may use analgesics such as Tylenol every 4-6 hours, not to exceed recommended dose  - You may apply Cerave Healing Ointment, Vaseline or Aquaphor  - If you have a lot of swelling you may take a Benadryl to help with this (this may cause drowsiness)  Sun Precautions  - Wear a wide brim hat for the next week if outside  - Wear a sunblock with zinc or titanium dioxide at least SPF 50 daily   We will recheck you in 10-12 weeks. If any problems, please call the office and ask to speak with a nurse.   If you have any questions or concerns for your doctor, please call our main line at [phone] and press option 4 to reach your doctor's medical assistant. If no one answers, please leave a voicemail as directed and we will return your call as soon as possible. Messages left after 4 pm will be answered the following business day.   You may also send [place] a message via [place]. We typically respond to MyChart messages within 1-2 business days.  For prescription refills, please ask your pharmacy to contact our office. Our fax number is [phone].  If you have an urgent issue when the clinic is closed that cannot wait until the next business day, you can page your doctor at the number below.    Please note that while we do our best to be available for urgent issues outside of office hours, we are not available 24/7.   If you have an urgent issue and are unable to reach [place], you may  choose to seek medical care at your doctor's office, retail clinic, urgent care center, or emergency room.  If you have a medical emergency, please immediately call 911 or go to the emergency department.  Pager Numbers  - Dr. Nehemiah Massed: [phone]  - Dr. Laurence Ferrari: [phone]  - Dr. Nicole Kindred: [phone]  In the event of inclement weather, please call our main line at [phone] for an update on the status of any delays or closures.  Dermatology Medication Tips: Please keep the boxes that topical medications come in in order to help keep track of the instructions about where and how to use these. Pharmacies typically print the medication instructions only on the boxes and not directly on the medication tubes.   If your medication is too expensive, please contact our office at [phone] option 4 or send [place] a message through [place].   We are unable to tell what your co-pay for medications will be in advance as this is different depending on your insurance coverage. However, we may be able to find a substitute medication at lower cost or fill out paperwork to get insurance to cover a needed medication.   If a prior authorization is required to get your medication covered by your insurance company, please allow [place] 1-2 business days to complete this process.  Drug prices often vary depending on  where the prescription is filled and some pharmacies may offer cheaper prices.  The website www.goodrx.com contains coupons for medications through different pharmacies. The prices here do not account for what the cost may be with help from insurance (it may be cheaper with your insurance), but the website can give you the price if you did not use any insurance.  - You can print the associated coupon and take it with your prescription to the pharmacy.  - You may also stop by our office during regular business hours and pick up a GoodRx coupon card.  - If you need your prescription sent  electronically to a different pharmacy, notify our office through [facility] or by phone at [phone] option 4.

## 2020-06-10 NOTE — Progress Notes (Signed)
   Follow-Up Visit     Assessment & Plan  AK (actinic keratosis) face  Photodynamic therapy - face Procedure discussed: discussed risks, benefits, side effects. and alternatives   Prep: site scrubbed/prepped with acetone   Location:  Face Number of lesions:  Multiple (multiple) Type of treatment:  Blue light Aminolevulinic Acid (see MAR for details): Levulan Number of Levulan sticks used:  1 Incubation time (minutes):  60 Number of minutes under lamp:  16 (16) Number of seconds under lamp:  40 (40) Cooling:  Fan Outcome: patient tolerated procedure well with no complications   Post-procedure details: sunscreen applied and aftercare instructions given to patient    Ordered Medications: Aminolevulinic Acid HCl 20 % SOLR 354 mg  Return for as scheduled .  I, Marye Round, CMA, am acting as scribe for [employer] .

## 2020-06-17 ENCOUNTER — Ambulatory Visit: Payer: BC Managed Care – PPO

## 2020-06-17 ENCOUNTER — Other Ambulatory Visit: Payer: Self-pay

## 2020-06-17 DIAGNOSIS — Z9181 History of falling: Secondary | ICD-10-CM

## 2020-06-17 DIAGNOSIS — R262 Difficulty in walking, not elsewhere classified: Secondary | ICD-10-CM | POA: Diagnosis not present

## 2020-06-17 DIAGNOSIS — M6281 Muscle weakness (generalized): Secondary | ICD-10-CM

## 2020-06-17 NOTE — Therapy (Signed)
[place] [facility] [address] [place], [address] Phone: [phone]   Fax:  [phone]  Physical Therapy Treatment/ Progress Note  Reporting Period: 03/15/2020-06/17/2020 Patient Details  Name: Michelle Ruiz MRN: [id number] Date of Birth: 09/22/1970 No data recorded  Encounter Date: 06/17/2020   PT End of Session - 06/17/20 1735    Visit Number 027    Number of Visits 132    Date for PT Re-Evaluation 06/08/19    Authorization Type 5/10    PT Start Time 1645    PT Stop Time 1730    PT Time Calculation (min) 45 min    Equipment Utilized During Treatment Gait belt    Activity Tolerance Patient tolerated treatment well;No increased pain;Other (comment)    Behavior During Therapy [facility] for tasks assessed/performed;Anxious           Past Medical History:  Diagnosis Date  . Abnormal Pap smear of cervix   . Actinic keratosis   . Anxiety   . Basal cell carcinoma 04/14/2008   Right upper ant. thigh. BCC with sclerosis.  . Basal cell carcinoma 08/14/2008   Right lat. lower thigh. Superficial.   . Basal cell carcinoma 04/29/2019   Right mid forearm. Superficial and nodular patterns. EDC.  Marland Kitchen Kidney stones   . MS (multiple sclerosis) ([place])   . UTI (urinary tract infection)     Past Surgical History:  Procedure Laterality Date  . BREAST BIOPSY Right 2010   stereotactic biopsy/ neg/ dr.byrnett  . BREAST BIOPSY Left 2012   neg/dr. brynett  . CESAREAN SECTION  2008   RPH  . TONSILLECTOMY  2006    There were no vitals filed for this visit.   Subjective Assessment - 06/17/20 1659    Subjective Patient reports she has been having increased balance difficulty. States she also has increasaed balance difficulty with walking.    Patient is accompained by: Family member    Pertinent History Patient reports history of falls x3 in the past 6 months secondary to drop foot on the L LE. PMH: Multiple sclerosis; elbow  fracture of the R UE    Limitations Standing;Walking;Lifting    How long can you stand comfortably? 20 min    How long can you walk comfortably? 1 miles    Patient Stated Goals To be more balanced and core strengthening     Currently in Pain? No/denies    Pain Onset More than a month ago               TREATMENT Therapeutic Exercise Ambulation with focus on improving speed and confidence with falls- 1227ft Ascending/descending ramps - 5 min  EC walking forward - 3 x 81ft Backwards walking - 3 x 37ft Tandem ambulation without UE support - 6 x 14ft B Ascending/descending the stairs - x 4    Performed exercises to improve balance           PT Long Term Goals - 06/17/20 1702      PT LONG TERM GOAL #1   Title Patient will be independent with balance HEP to continue benefits of therapy after discharge.    Baseline dependent with form/technique for balance exercise; 03/25/2018: Independent     Time 6    Period Weeks    Status Achieved      PT LONG TERM GOAL #2   Title Patient will improve FGA by 4 points to indicate significant improvement with balancing while ambulating and  decrease of fall risk    Baseline 03/25/2018: 28/30; 04/10/2019: 22; 07/21/2019: 29; 09/17/2019: 30/30    Time 6    Period Weeks    Status Achieved      PT LONG TERM GOAL #3   Title Patient will be able to perform prolonged single leg stance >10sec with eyes closed to indicate improvement with static balance and decrease fall risk    Baseline 2 sec in standing EC; 03/25/2018: 3 sec; 04/29/2018: 4 sec; 08/21/2018: 5 sec; 09/19/2018: 6 sec ; 10/21/2018 3 sec; 11/21/2018 5 sec; 01/08/2019: 9 sec; 02/25/2019: 14sec    Time 4    Period Weeks    Status Achieved      PT LONG TERM GOAL #4   Title Pt will improve 6 MWT >2074ft to demonstrate improved cardiorespiratory fitness    Baseline Deferred to next session: 04/29/2018; 05/01/2018: 1453ft; 08/21/2018: 1461ft; 09/19/2018: 1417ft; 10/21/2018 1621; 11/21/2018 deferred  to next session 2/2 time.; 01/08/2019: 1480 ft; 02/25/2019: 1346ft; 04/10/2019: 1400 ft, 05/13/19: 1321 ft; 07/21/2019: 1170 ft; 09/17/2019: 1260ft; 11/17/2019: 1253ft; 01/13/20: 1601; 03/15/20: 1234ft; 06/17/2020: 1284ft    Time 4    Period Weeks    Status On-going      PT LONG TERM GOAL #5   Title Patient will be able to run 239ft without stopping to better be able to return to recreational activites and address cardiovascular endurance.    Baseline 18ft; 02/25/2019: 5ft; 04/10/2019 deferred    Time 4    Period Weeks    Status Deferred      Additional Long Term Goals   Additional Long Term Goals Yes      PT LONG TERM GOAL #6   Title Patient will improve L hip flexion strength to 4/5 for improved steppage gait and reduced fall risk.    Baseline 04/10/2019: L hip flexion 3+/5; 07/21/2019: L Hip 4/5    Time 6    Period Weeks    Status Achieved      PT LONG TERM GOAL #7   Title Patient will be able to lift her affected L LE to the same extent as the R LE to allow for greater amount of foot clearance when ambulating    Baseline L LE: 10", R LE: 15"; 11/17/2019: L LE: 14", R LE 17.5"; 01/13/2020: L LE: 15.5", R LE 18"; 12/20: 18" B    Time 8    Period Weeks    Status Achieved      PT LONG TERM GOAL #8   Title Patient will have full dorsiflexion with 5/5 strength to allow for greater toe clearance with the performance of exercises, most notably when walking.    Baseline L 4/5 5 degree ; R 5/5 12 degree; L 4+/5 5 degree ; R 5/5 12 degree    Time 8    Period Weeks    Status Achieved      PT LONG TERM GOAL  #9   TITLE Patient will have no scuffing along L LE with performance of 6 min WT to indicate    Baseline 06/17/2020: No scuffing during today    Time 6    Period Weeks    Status Achieved      PT LONG TERM GOAL  #10   TITLE Patient will improve her ABC by 13% to indicate significant improvement in confidence with her balance    Baseline 56.25%    Time 6    Period Weeks    Status New  PT LONG TERM GOAL  #11   TITLE Patient will improve FGA by 3 points to indicate significant improvement in fucntioncal gait and balance with walking.    Baseline 26/30    Time 6    Period Weeks    Status New                 Plan - 06/17/20 1736    Clinical Impression Statement Patient is demonstrates improvement with ability to perform 36minWT with any "scuffing" of her feet indicating functional carryover between sessions. Although patient is improving, she demosntrates decreased walking endurance with 6 minWT this may be seocndary to increased balance difficulty from MS. Retested FGA and patient scores a 26/30 versus a 30/30 from 6 months prior; will address function gait balance in future sessions secondary to this. Patient will benefit from further skilled therapy foucsed on improving limitations to improve functional capacity.    Personal Factors and Comorbidities Past/Current Experience    Examination-Activity Limitations Locomotion Level    Examination-Participation Restrictions Community Activity    Stability/Clinical Decision Making Stable/Uncomplicated    Rehab Potential Good    Clinical Impairments Affecting Rehab Potential (-) hx of MS; (+) good motivation    PT Frequency 2x / week    PT Duration 4 weeks    PT Treatment/Interventions Moist Heat;Iontophoresis 4mg /ml Dexamethasone;Electrical Stimulation;Aquatic Therapy;Cryotherapy;Therapeutic activities;Neuromuscular re-education;Therapeutic exercise;Patient/family education;Dry needling;Manual techniques;Passive range of motion;Balance training;Functional mobility training;Gait training;Stair training;Joint Manipulations;Prosthetic Training    PT Next Visit Plan Dynamic balance and LE strength exercise    PT Home Exercise Plan hip flexion in sitting for strengthening, hip flexion knee flexion in standing for balance, bike intervals at 70%-75% max HR    Consulted and Agree with Plan of Care Patient           Patient  will benefit from skilled therapeutic intervention in order to improve the following deficits and impairments:  Pain,Decreased coordination,Impaired perceived functional ability,Increased fascial restricitons,Increased muscle spasms,Difficulty walking,Abnormal gait,Decreased balance,Decreased mobility,Impaired sensation,Increased edema  Visit Diagnosis: Difficulty in walking, not elsewhere classified  Muscle weakness (generalized)  History of falling     Problem List There are no problems to display for this patient.   [name], PT DPT 06/17/2020, 5:45 PM  [place] [facility] [address] [place], [address] Phone: [phone]   Fax:  [phone]  Name: Michelle Ruiz MRN: [id number] Date of Birth: 1970/09/04

## 2020-06-24 ENCOUNTER — Ambulatory Visit: Payer: BC Managed Care – PPO

## 2020-06-24 ENCOUNTER — Other Ambulatory Visit: Payer: Self-pay

## 2020-06-24 DIAGNOSIS — R262 Difficulty in walking, not elsewhere classified: Secondary | ICD-10-CM | POA: Diagnosis not present

## 2020-06-24 DIAGNOSIS — M6281 Muscle weakness (generalized): Secondary | ICD-10-CM

## 2020-06-24 NOTE — Therapy (Signed)
[facility] [address] [place], [address] Phone: [phone]   Fax:  [phone]  Physical Therapy Treatment  Patient Details  Name: Michelle Ruiz MRN: [id number] Date of Birth: 14-Feb-1971 No data recorded  Encounter Date: 06/24/2020   PT End of Session - 06/24/20 1702    Visit Number 124    Number of Visits 132    Date for PT Re-Evaluation 09/07/20    Authorization Type 4/10    PT Start Time 1645    PT Stop Time 1730    PT Time Calculation (min) 45 min    Equipment Utilized During Treatment Gait belt    Activity Tolerance Patient tolerated treatment well;No increased pain;Other (comment)    Behavior During Therapy [facility] for tasks assessed/performed;Anxious           Past Medical History:  Diagnosis Date  . Abnormal Pap smear of cervix   . Actinic keratosis   . Anxiety   . Basal cell carcinoma 04/14/2008   Right upper ant. thigh. BCC with sclerosis.  . Basal cell carcinoma 08/14/2008   Right lat. lower thigh. Superficial.   . Basal cell carcinoma 04/29/2019   Right mid forearm. Superficial and nodular patterns. EDC.  Marland Kitchen Kidney stones   . MS (multiple sclerosis) ([place])   . UTI (urinary tract infection)     Past Surgical History:  Procedure Laterality Date  . BREAST BIOPSY Right 2010   stereotactic biopsy/ neg/ dr.byrnett  . BREAST BIOPSY Left 2012   neg/dr. brynett  . CESAREAN SECTION  2008   RPH  . TONSILLECTOMY  2006    There were no vitals filed for this visit.   Subjective Assessment - 06/24/20 1655    Subjective Patient states she sustained a fall this morning with first waking up. States she was half asleep and tripped over her foot. Patient does not report sustaining any injury.    Patient is accompained by: Family member    Pertinent History Patient reports history of falls x3 in the past 6 months secondary to drop foot on the L LE. PMH: Multiple sclerosis; elbow fracture of the  R UE    Limitations Standing;Walking;Lifting    How long can you stand comfortably? 20 min    How long can you walk comfortably? 1 miles    Patient Stated Goals To be more balanced and core strengthening     Currently in Pain? No/denies    Pain Onset More than a month ago              TREATMENT Therapeutic Exercise Running man with unilateral UE support - -x 20  SLS twists - x 20 Hip abduction in standing - swings - x 20  Hip extension in standing - swings - x 20 SLS without UE support - x 2 min without UE support TRX squats to chair - x 20 Step taps with 12 cones - x 10 Performed to address LE weakness and balance    PT Education - 06/24/20 1701    Education Details form/technique with exercise    Person(s) Educated Patient    Methods Explanation;Demonstration    Comprehension Verbalized understanding;Returned demonstration               PT Long Term Goals - 06/17/20 1702      PT LONG TERM GOAL #1   Title Patient will be independent with balance HEP to continue benefits of therapy after discharge.  Baseline dependent with form/technique for balance exercise; 03/25/2018: Independent     Time 6    Period Weeks    Status Achieved      PT LONG TERM GOAL #2   Title Patient will improve FGA by 4 points to indicate significant improvement with balancing while ambulating and decrease of fall risk    Baseline 03/25/2018: 28/30; 04/10/2019: 22; 07/21/2019: 29; 09/17/2019: 30/30    Time 6    Period Weeks    Status Achieved      PT LONG TERM GOAL #3   Title Patient will be able to perform prolonged single leg stance >10sec with eyes closed to indicate improvement with static balance and decrease fall risk    Baseline 2 sec in standing EC; 03/25/2018: 3 sec; 04/29/2018: 4 sec; 08/21/2018: 5 sec; 09/19/2018: 6 sec ; 10/21/2018 3 sec; 11/21/2018 5 sec; 01/08/2019: 9 sec; 02/25/2019: 14sec    Time 4    Period Weeks    Status Achieved      PT LONG TERM GOAL #4   Title Pt will  improve 6 MWT >2045ft to demonstrate improved cardiorespiratory fitness    Baseline Deferred to next session: 04/29/2018; 05/01/2018: 1420ft; 08/21/2018: 1463ft; 09/19/2018: 1488ft; 10/21/2018 1621; 11/21/2018 deferred to next session 2/2 time.; 01/08/2019: 1480 ft; 02/25/2019: 1365ft; 04/10/2019: 1400 ft, 05/13/19: 1321 ft; 07/21/2019: 1170 ft; 09/17/2019: 1256ft; 11/17/2019: 1260ft; 01/13/20: 4403; 03/15/20: 1249ft; 06/17/2020: 1217ft    Time 4    Period Weeks    Status On-going      PT LONG TERM GOAL #5   Title Patient will be able to run 263ft without stopping to better be able to return to recreational activites and address cardiovascular endurance.    Baseline 1ft; 02/25/2019: 65ft; 04/10/2019 deferred    Time 4    Period Weeks    Status Deferred      Additional Long Term Goals   Additional Long Term Goals Yes      PT LONG TERM GOAL #6   Title Patient will improve L hip flexion strength to 4/5 for improved steppage gait and reduced fall risk.    Baseline 04/10/2019: L hip flexion 3+/5; 07/21/2019: L Hip 4/5    Time 6    Period Weeks    Status Achieved      PT LONG TERM GOAL #7   Title Patient will be able to lift her affected L LE to the same extent as the R LE to allow for greater amount of foot clearance when ambulating    Baseline L LE: 10", R LE: 15"; 11/17/2019: L LE: 14", R LE 17.5"; 01/13/2020: L LE: 15.5", R LE 18"; 12/20: 18" B    Time 8    Period Weeks    Status Achieved      PT LONG TERM GOAL #8   Title Patient will have full dorsiflexion with 5/5 strength to allow for greater toe clearance with the performance of exercises, most notably when walking.    Baseline L 4/5 5 degree ; R 5/5 12 degree; L 4+/5 5 degree ; R 5/5 12 degree    Time 8    Period Weeks    Status Achieved      PT LONG TERM GOAL  #9   TITLE Patient will have no scuffing along L LE with performance of 6 min WT to indicate    Baseline 06/17/2020: No scuffing during today    Time 6    Period Weeks    Status  Achieved      PT LONG TERM GOAL  #10   TITLE Patient will improve her ABC by 13% to indicate significant improvement in confidence with her balance    Baseline 56.25%    Time 6    Period Weeks    Status New      PT LONG TERM GOAL  #11   TITLE Patient will improve FGA by 3 points to indicate significant improvement in fucntioncal gait and balance with walking.    Baseline 26/30    Time 6    Period Weeks    Status New                 Plan - 06/24/20 1708    Clinical Impression Statement Performed exercises to address balance and LE weakness. Improvement in SLS time compared to previous sessions, however conitnues to have difficulties with dynamic movements. Patient will benefit from further skilled therapy to prior level of function.    Personal Factors and Comorbidities Past/Current Experience    Examination-Activity Limitations Locomotion Level    Examination-Participation Restrictions Community Activity    Stability/Clinical Decision Making Stable/Uncomplicated    Rehab Potential Good    Clinical Impairments Affecting Rehab Potential (-) hx of MS; (+) good motivation    PT Frequency 2x / week    PT Duration 4 weeks    PT Treatment/Interventions Moist Heat;Iontophoresis 4mg /ml Dexamethasone;Electrical Stimulation;Aquatic Therapy;Cryotherapy;Therapeutic activities;Neuromuscular re-education;Therapeutic exercise;Patient/family education;Dry needling;Manual techniques;Passive range of motion;Balance training;Functional mobility training;Gait training;Stair training;Joint Manipulations;Prosthetic Training    PT Next Visit Plan Dynamic balance and LE strength exercise    PT Home Exercise Plan hip flexion in sitting for strengthening, hip flexion knee flexion in standing for balance, bike intervals at 70%-75% max HR    Consulted and Agree with Plan of Care Patient           Patient will benefit from skilled therapeutic intervention in order to improve the following deficits and  impairments:  Pain,Decreased coordination,Impaired perceived functional ability,Increased fascial restricitons,Increased muscle spasms,Difficulty walking,Abnormal gait,Decreased balance,Decreased mobility,Impaired sensation,Increased edema  Visit Diagnosis: Difficulty in walking, not elsewhere classified  Muscle weakness (generalized)     Problem List There are no problems to display for this patient.   [name], PT DPT 06/24/2020, 5:32 PM  [facility] [address], [place], [address] Phone: [phone]   Fax:  [phone]  Name: KATHLEAN CINCO MRN: [id number] Date of Birth: [birth year]

## 2020-06-30 ENCOUNTER — Other Ambulatory Visit: Payer: Self-pay

## 2020-06-30 ENCOUNTER — Ambulatory Visit: Payer: BC Managed Care – PPO | Attending: Neurology

## 2020-06-30 DIAGNOSIS — R262 Difficulty in walking, not elsewhere classified: Secondary | ICD-10-CM | POA: Insufficient documentation

## 2020-06-30 DIAGNOSIS — Z9181 History of falling: Secondary | ICD-10-CM

## 2020-06-30 DIAGNOSIS — M6281 Muscle weakness (generalized): Secondary | ICD-10-CM | POA: Diagnosis present

## 2020-06-30 NOTE — Therapy (Signed)
[facility] [address], [place], [address] Phone: [phone]   Fax:  [phone]  Physical Therapy Treatment  Patient Details  Name: Michelle Ruiz MRN: [id number] Date of Birth: [birth year] No data recorded  Encounter Date: 06/30/2020   PT End of Session - 06/30/20 [place]    Visit Number 125    Number of Visits 130    Date for PT Re-Evaluation 09/07/20    Authorization Type 5/10    PT Start Time 1815    PT Stop Time 1900    PT Time Calculation (min) 45 min    Equipment Utilized During Treatment Gait belt    Activity Tolerance Patient tolerated treatment well;No increased pain;Other (comment)    Behavior During Therapy [facility] for tasks assessed/performed;Anxious           Past Medical History:  Diagnosis Date  . Abnormal Pap smear of cervix   . Actinic keratosis   . Anxiety   . Basal cell carcinoma 04/14/2008   Right upper ant. thigh. BCC with sclerosis.  . Basal cell carcinoma 08/14/2008   Right lat. lower thigh. Superficial.   . Basal cell carcinoma 04/29/2019   Right mid forearm. Superficial and nodular patterns. EDC.  Marland Kitchen Kidney stones   . MS (multiple sclerosis) ([place])   . UTI (urinary tract infection)     Past Surgical History:  Procedure Laterality Date  . BREAST BIOPSY Right 2010   stereotactic biopsy/ neg/ dr.byrnett  . BREAST BIOPSY Left 2012   neg/dr. brynett  . CESAREAN SECTION  2008   RPH  . TONSILLECTOMY  2006    There were no vitals filed for this visit.   Subjective Assessment - 06/30/20 1821    Subjective Patient states no major changes since the previous session. Patient states no additional notable difficulties with walking.    Patient is accompained by: Family member    Pertinent History Patient reports history of falls x3 in the past 6 months secondary to drop foot on the L LE. PMH: Multiple sclerosis; elbow fracture of the R UE    Limitations Standing;Walking;Lifting     How long can you stand comfortably? 20 min    How long can you walk comfortably? 1 miles    Patient Stated Goals To be more balanced and core strengthening     Currently in Pain? No/denies    Pain Onset More than a month ago              TREATMENT Therapeutic Exercise SLS twists - x 15 Hip abduction in standing - swings - x 15 Hip extension in standing - swings - x 15 TRX squats to chair - x 15 Lateral lunges in standing with UE support - x 15 Lunges in standing forward/reverse lunge mixature - x 10 B Marches on airex pad - x 25 with CGA from therapist Tandem amb on airex beam - 10 x 68ft SLS without UE support - x 2 min With ball tosss Performed to address LE weakness and balance     PT Education - 06/30/20 1824    Education Details form/technique with exercise    Person(s) Educated Patient    Methods Explanation;Demonstration    Comprehension Verbalized understanding;Returned demonstration               PT Long Term Goals - 06/17/20 1702      PT LONG TERM GOAL #1   Title Patient will be independent with  balance HEP to continue benefits of therapy after discharge.    Baseline dependent with form/technique for balance exercise; 03/25/2018: Independent     Time 6    Period Weeks    Status Achieved      PT LONG TERM GOAL #2   Title Patient will improve FGA by 4 points to indicate significant improvement with balancing while ambulating and decrease of fall risk    Baseline 03/25/2018: 28/30; 04/10/2019: 22; 07/21/2019: 29; 09/17/2019: 30/30    Time 6    Period Weeks    Status Achieved      PT LONG TERM GOAL #3   Title Patient will be able to perform prolonged single leg stance >10sec with eyes closed to indicate improvement with static balance and decrease fall risk    Baseline 2 sec in standing EC; 03/25/2018: 3 sec; 04/29/2018: 4 sec; 08/21/2018: 5 sec; 09/19/2018: 6 sec ; 10/21/2018 3 sec; 11/21/2018 5 sec; 01/08/2019: 9 sec; 02/25/2019: 14sec    Time 4    Period  Weeks    Status Achieved      PT LONG TERM GOAL #4   Title Pt will improve 6 MWT >2064ft to demonstrate improved cardiorespiratory fitness    Baseline Deferred to next session: 04/29/2018; 05/01/2018: 1446ft; 08/21/2018: 1460ft; 09/19/2018: 1477ft; 10/21/2018 1621; 11/21/2018 deferred to next session 2/2 time.; 01/08/2019: 1480 ft; 02/25/2019: 1321ft; 04/10/2019: 1400 ft, 05/13/19: 1321 ft; 07/21/2019: 1170 ft; 09/17/2019: 1215ft; 11/17/2019: 1214ft; 01/13/20: 9983; 03/15/20: 1295ft; 06/17/2020: 1293ft    Time 4    Period Weeks    Status On-going      PT LONG TERM GOAL #5   Title Patient will be able to run 219ft without stopping to better be able to return to recreational activites and address cardiovascular endurance.    Baseline 29ft; 02/25/2019: 27ft; 04/10/2019 deferred    Time 4    Period Weeks    Status Deferred      Additional Long Term Goals   Additional Long Term Goals Yes      PT LONG TERM GOAL #6   Title Patient will improve L hip flexion strength to 4/5 for improved steppage gait and reduced fall risk.    Baseline 04/10/2019: L hip flexion 3+/5; 07/21/2019: L Hip 4/5    Time 6    Period Weeks    Status Achieved      PT LONG TERM GOAL #7   Title Patient will be able to lift her affected L LE to the same extent as the R LE to allow for greater amount of foot clearance when ambulating    Baseline L LE: 10", R LE: 15"; 11/17/2019: L LE: 14", R LE 17.5"; 01/13/2020: L LE: 15.5", R LE 18"; 12/20: 18" B    Time 8    Period Weeks    Status Achieved      PT LONG TERM GOAL #8   Title Patient will have full dorsiflexion with 5/5 strength to allow for greater toe clearance with the performance of exercises, most notably when walking.    Baseline L 4/5 5 degree ; R 5/5 12 degree; L 4+/5 5 degree ; R 5/5 12 degree    Time 8    Period Weeks    Status Achieved      PT LONG TERM GOAL  #9   TITLE Patient will have no scuffing along L LE with performance of 6 min WT to indicate    Baseline  06/17/2020: No scuffing during today  Time 6    Period Weeks    Status Achieved      PT LONG TERM GOAL  #10   TITLE Patient will improve her ABC by 13% to indicate significant improvement in confidence with her balance    Baseline 56.25%    Time 6    Period Weeks    Status New      PT LONG TERM GOAL  #11   TITLE Patient will improve FGA by 3 points to indicate significant improvement in fucntioncal gait and balance with walking.    Baseline 26/30    Time 6    Period Weeks    Status New                 Plan - 06/30/20 1831    Clinical Impression Statement Continued to focus on improving LE strength with balancing with narrow BOS such as single leg stance. Patient tolerates exercises well, however conitnues to fatigue quickly. Patient will benefit from further skilled therapy to return to prior level of function.    Personal Factors and Comorbidities Past/Current Experience    Examination-Activity Limitations Locomotion Level    Examination-Participation Restrictions Community Activity    Stability/Clinical Decision Making Stable/Uncomplicated    Rehab Potential Good    Clinical Impairments Affecting Rehab Potential (-) hx of MS; (+) good motivation    PT Frequency 2x / week    PT Duration 4 weeks    PT Treatment/Interventions Moist Heat;Iontophoresis 4mg /ml Dexamethasone;Electrical Stimulation;Aquatic Therapy;Cryotherapy;Therapeutic activities;Neuromuscular re-education;Therapeutic exercise;Patient/family education;Dry needling;Manual techniques;Passive range of motion;Balance training;Functional mobility training;Gait training;Stair training;Joint Manipulations;Prosthetic Training    PT Next Visit Plan Dynamic balance and LE strength exercise    PT Home Exercise Plan hip flexion in sitting for strengthening, hip flexion knee flexion in standing for balance, bike intervals at 70%-75% max HR    Consulted and Agree with Plan of Care Patient           Patient will benefit  from skilled therapeutic intervention in order to improve the following deficits and impairments:  Pain,Decreased coordination,Impaired perceived functional ability,Increased fascial restricitons,Increased muscle spasms,Difficulty walking,Abnormal gait,Decreased balance,Decreased mobility,Impaired sensation,Increased edema  Visit Diagnosis: Difficulty in walking, not elsewhere classified  Muscle weakness (generalized)  History of falling     Problem List There are no problems to display for this patient.   [name], PT DPT 06/30/2020, 6:47 PM  [facility] [address], [place], [address] Phone: [phone]   Fax:  [phone]  Name: Michelle Ruiz MRN: [id number] Date of Birth: [birth year]

## 2020-07-08 ENCOUNTER — Ambulatory Visit: Payer: BC Managed Care – PPO | Admitting: Physical Therapy

## 2020-07-08 ENCOUNTER — Other Ambulatory Visit: Payer: Self-pay

## 2020-07-08 ENCOUNTER — Encounter: Payer: Self-pay | Admitting: Physical Therapy

## 2020-07-08 DIAGNOSIS — R262 Difficulty in walking, not elsewhere classified: Secondary | ICD-10-CM | POA: Diagnosis not present

## 2020-07-08 NOTE — Therapy (Signed)
[facility] [address] [place], [address] Phone: [phone]   Fax:  [phone]  Physical Therapy Treatment  Patient Details  Name: Michelle Ruiz MRN: [id number] Date of Birth: 12-18-1970 No data recorded  Encounter Date: 07/08/2020   PT End of Session - 07/09/20 0854    Visit Number 126    Number of Visits 130    Date for PT Re-Evaluation 09/07/20    Authorization Type 6/10    PT Start Time 5093    PT Stop Time 1755    PT Time Calculation (min) 39 min    Equipment Utilized During Treatment Gait belt    Activity Tolerance Patient tolerated treatment well;No increased pain;Other (comment)    Behavior During Therapy [facility] for tasks assessed/performed;Anxious           Past Medical History:  Diagnosis Date  . Abnormal Pap smear of cervix   . Actinic keratosis   . Anxiety   . Basal cell carcinoma 04/14/2008   Right upper ant. thigh. BCC with sclerosis.  . Basal cell carcinoma 08/14/2008   Right lat. lower thigh. Superficial.   . Basal cell carcinoma 04/29/2019   Right mid forearm. Superficial and nodular patterns. EDC.  Marland Kitchen Kidney stones   . MS (multiple sclerosis) ([place])   . UTI (urinary tract infection)     Past Surgical History:  Procedure Laterality Date  . BREAST BIOPSY Right 2010   stereotactic biopsy/ neg/ dr.byrnett  . BREAST BIOPSY Left 2012   neg/dr. brynett  . CESAREAN SECTION  2008   RPH  . TONSILLECTOMY  2006    There were no vitals filed for this visit.   Subjective Assessment - 07/09/20 0853    Subjective No falls, or noted difficulties noted since last session. Doing well overall.    Patient is accompained by: Family member    Pertinent History Patient reports history of falls x3 in the past 6 months secondary to drop foot on the L LE. PMH: Multiple sclerosis; elbow fracture of the R UE    Limitations Standing;Walking;Lifting    How long can you stand comfortably? 20 min     How long can you walk comfortably? 1 miles    Patient Stated Goals To be more balanced and core strengthening     Pain Onset More than a month ago           TREATMENT Therapeutic Exercise SLS twists -x 15 Hip abduction in standing -swings -x 15 Hip extension in standing -swings -x 15 TRX squats to chair -2x 15 Walking lunges 2x 87ft with CGA for safety  Marches on airex pad - x 25 with CGA from therapist Side stepping on airex pad x10 (5L 5R) with CGA for safety Tandem amb on airex beam - 10 x 26ft CGA for safety  SLS without UE support -x 2 min With ball toss, ball behind for safety, therapist tossing slightly outside BOS for increased reach  Performed to address LE weakness and balance                          PT Education - 07/09/20 0854    Education Details therex form/technique    Person(s) Educated Patient    Methods Explanation;Demonstration;Verbal cues    Comprehension Verbalized understanding;Verbal cues required;Returned demonstration               PT Long Term Goals - 06/17/20  [place] #1   Title Patient will be independent with balance HEP to continue benefits of therapy after discharge.    Baseline dependent with form/technique for balance exercise; 03/25/2018: Independent     Time 6    Period Weeks    Status Achieved      PT LONG TERM GOAL #2   Title Patient will improve FGA by 4 points to indicate significant improvement with balancing while ambulating and decrease of fall risk    Baseline 03/25/2018: 28/30; 04/10/2019: 22; 07/21/2019: 29; 09/17/2019: 30/30    Time 6    Period Weeks    Status Achieved      PT LONG TERM GOAL #3   Title Patient will be able to perform prolonged single leg stance >10sec with eyes closed to indicate improvement with static balance and decrease fall risk    Baseline 2 sec in standing EC; 03/25/2018: 3 sec; 04/29/2018: 4 sec; 08/21/2018: 5 sec; 09/19/2018: 6 sec ; 10/21/2018 3  sec; 11/21/2018 5 sec; 01/08/2019: 9 sec; 02/25/2019: 14sec    Time 4    Period Weeks    Status Achieved      PT LONG TERM GOAL #4   Title Pt will improve 6 MWT >2092ft to demonstrate improved cardiorespiratory fitness    Baseline Deferred to next session: 04/29/2018; 05/01/2018: 1461ft; 08/21/2018: 1456ft; 09/19/2018: 1416ft; 10/21/2018 1621; 11/21/2018 deferred to next session 2/2 time.; 01/08/2019: 1480 ft; 02/25/2019: 1348ft; 04/10/2019: 1400 ft, 05/13/19: 1321 ft; 07/21/2019: 1170 ft; 09/17/2019: 1229ft; 11/17/2019: 1212ft; 01/13/20: 5916; 03/15/20: 1269ft; 06/17/2020: 1244ft    Time 4    Period Weeks    Status On-going      PT LONG TERM GOAL #5   Title Patient will be able to run 285ft without stopping to better be able to return to recreational activites and address cardiovascular endurance.    Baseline 29ft; 02/25/2019: 58ft; 04/10/2019 deferred    Time 4    Period Weeks    Status Deferred      Additional Long Term Goals   Additional Long Term Goals Yes      PT LONG TERM GOAL #6   Title Patient will improve L hip flexion strength to 4/5 for improved steppage gait and reduced fall risk.    Baseline 04/10/2019: L hip flexion 3+/5; 07/21/2019: L Hip 4/5    Time 6    Period Weeks    Status Achieved      PT LONG TERM GOAL #7   Title Patient will be able to lift her affected L LE to the same extent as the R LE to allow for greater amount of foot clearance when ambulating    Baseline L LE: 10", R LE: 15"; 11/17/2019: L LE: 14", R LE 17.5"; 01/13/2020: L LE: 15.5", R LE 18"; 12/20: 18" B    Time 8    Period Weeks    Status Achieved      PT LONG TERM GOAL #8   Title Patient will have full dorsiflexion with 5/5 strength to allow for greater toe clearance with the performance of exercises, most notably when walking.    Baseline L 4/5 5 degree ; R 5/5 12 degree; L 4+/5 5 degree ; R 5/5 12 degree    Time 8    Period Weeks    Status Achieved      PT LONG TERM GOAL  #9   TITLE Patient will have no  scuffing along L  LE with performance of 6 min WT to indicate    Baseline 06/17/2020: No scuffing during today    Time 6    Period Weeks    Status Achieved      PT LONG TERM GOAL  #10   TITLE Patient will improve her ABC by 13% to indicate significant improvement in confidence with her balance    Baseline 56.25%    Time 6    Period Weeks    Status New      PT LONG TERM GOAL  #11   TITLE Patient will improve FGA by 3 points to indicate significant improvement in fucntioncal gait and balance with walking.    Baseline 26/30    Time 6    Period Weeks    Status New                 Plan - 07/09/20 1700    Clinical Impression Statement PT continued to focus session on increased BLE strength and dynamic balance with success. Patient is able to complete all therex progressions with some gaurding needed for safety, min cuing needed for proper technique of therex with good carry over. Patient with appropriate rest breaks, with continued difficulty with fatigue. Patient is motivated throughout session, without pain, only muscle fatigue. PT will continue progression as able.    Personal Factors and Comorbidities Past/Current Experience    Examination-Activity Limitations Locomotion Level    Examination-Participation Restrictions Community Activity    Stability/Clinical Decision Making Stable/Uncomplicated    Clinical Decision Making Low    Rehab Potential Good    Clinical Impairments Affecting Rehab Potential (-) hx of MS; (+) good motivation    PT Frequency 2x / week    PT Duration 4 weeks    PT Treatment/Interventions Moist Heat;Iontophoresis 4mg /ml Dexamethasone;Electrical Stimulation;Aquatic Therapy;Cryotherapy;Therapeutic activities;Neuromuscular re-education;Therapeutic exercise;Patient/family education;Dry needling;Manual techniques;Passive range of motion;Balance training;Functional mobility training;Gait training;Stair training;Joint Manipulations;Prosthetic Training    PT Next  Visit Plan Dynamic balance and LE strength exercise    PT Home Exercise Plan hip flexion in sitting for strengthening, hip flexion knee flexion in standing for balance, bike intervals at 70%-75% max HR    Consulted and Agree with Plan of Care Patient           Patient will benefit from skilled therapeutic intervention in order to improve the following deficits and impairments:  Pain,Decreased coordination,Impaired perceived functional ability,Increased fascial restricitons,Increased muscle spasms,Difficulty walking,Abnormal gait,Decreased balance,Decreased mobility,Impaired sensation,Increased edema  Visit Diagnosis: Difficulty in walking, not elsewhere classified     Problem List There are no problems to display for this patient.  Durwin Reges DPT Durwin Reges 07/09/2020, 9:08 AM  [facility] [address] [place], [address] Phone: [phone]   Fax:  [phone]  Name: Michelle Ruiz MRN: [id number] Date of Birth: 04/22/1970

## 2020-07-13 ENCOUNTER — Other Ambulatory Visit: Payer: Self-pay

## 2020-07-13 ENCOUNTER — Ambulatory Visit: Payer: BC Managed Care – PPO

## 2020-07-13 ENCOUNTER — Encounter: Payer: Self-pay | Admitting: Physical Therapy

## 2020-07-13 ENCOUNTER — Ambulatory Visit: Payer: BC Managed Care – PPO | Admitting: Physical Therapy

## 2020-07-13 DIAGNOSIS — R262 Difficulty in walking, not elsewhere classified: Secondary | ICD-10-CM

## 2020-07-13 DIAGNOSIS — Z9181 History of falling: Secondary | ICD-10-CM

## 2020-07-13 DIAGNOSIS — M6281 Muscle weakness (generalized): Secondary | ICD-10-CM

## 2020-07-13 NOTE — Therapy (Signed)
[facility] [address], [place], [address] Phone: [phone]   Fax:  [phone]  Physical Therapy Treatment  Patient Details  Name: Michelle Ruiz MRN: [id number] Date of Birth: 20-Mar-1971 No data recorded  Encounter Date: 07/13/2020   PT End of Session - 07/13/20 1635    Visit Number 127    Number of Visits 130    Date for PT Re-Evaluation 09/07/20    Authorization Type 7/10    PT Start Time 1600    PT Stop Time 1642    PT Time Calculation (min) 42 min    Equipment Utilized During Treatment Gait belt    Activity Tolerance Patient tolerated treatment well;No increased pain    Behavior During Therapy [facility] for tasks assessed/performed;Anxious           Past Medical History:  Diagnosis Date  . Abnormal Pap smear of cervix   . Actinic keratosis   . Anxiety   . Basal cell carcinoma 04/14/2008   Right upper ant. thigh. BCC with sclerosis.  . Basal cell carcinoma 08/14/2008   Right lat. lower thigh. Superficial.   . Basal cell carcinoma 04/29/2019   Right mid forearm. Superficial and nodular patterns. EDC.  Marland Kitchen Kidney stones   . MS (multiple sclerosis) ([place])   . UTI (urinary tract infection)     Past Surgical History:  Procedure Laterality Date  . BREAST BIOPSY Right 2010   stereotactic biopsy/ neg/ dr.byrnett  . BREAST BIOPSY Left 2012   neg/dr. brynett  . CESAREAN SECTION  2008   RPH  . TONSILLECTOMY  2006    There were no vitals filed for this visit.   Subjective Assessment - 07/13/20 1609    Subjective No falls or issues since last visit. Sore currently d/t walking earlier today.    Patient is accompained by: Family member    Pertinent History Patient reports history of falls x3 in the past 6 months secondary to drop foot on the L LE. PMH: Multiple sclerosis; elbow fracture of the R UE    Limitations Standing;Walking;Lifting    How long can you stand comfortably? 20 min    How long can  you walk comfortably? 1 miles    Patient Stated Goals To be more balanced and core strengthening     Pain Onset More than a month ago             TREATMENT Therapeutic Exercise Hip abduction in standing - swings - x 20  Hip extension in standing - swings - x 20 TRX squats to chair - x 20 Mini reverse lunge to hip flex (runners stance) unilateral UE support - -x 20 bilat with min cuing for increased hip flex Alt toe taps on weighted ball x20 with min cuing for increased speed to available level Mountain climbers 2x 20 modified propped on plinth with cuing for increased hip flexion Bear crawl 2x 6ft with knees elevated 1in with min cuing to "keep bottom down" with decent carry over wt'd ball toss to rebounder 2x 12 narrow BOS with supervision for safety                              PT Education - 07/13/20 1632    Education Details therex form/technique    Person(s) Educated Patient    Methods Explanation;Demonstration;Verbal cues    Comprehension Verbalized understanding;Returned demonstration;Verbal cues required  PT Long Term Goals - 06/17/20 1702      PT LONG TERM GOAL #1   Title Patient will be independent with balance HEP to continue benefits of therapy after discharge.    Baseline dependent with form/technique for balance exercise; 03/25/2018: Independent     Time 6    Period Weeks    Status Achieved      PT LONG TERM GOAL #2   Title Patient will improve FGA by 4 points to indicate significant improvement with balancing while ambulating and decrease of fall risk    Baseline 03/25/2018: 28/30; 04/10/2019: 22; 07/21/2019: 29; 09/17/2019: 30/30    Time 6    Period Weeks    Status Achieved      PT LONG TERM GOAL #3   Title Patient will be able to perform prolonged single leg stance >10sec with eyes closed to indicate improvement with static balance and decrease fall risk    Baseline 2 sec in standing EC; 03/25/2018: 3 sec;  04/29/2018: 4 sec; 08/21/2018: 5 sec; 09/19/2018: 6 sec ; 10/21/2018 3 sec; 11/21/2018 5 sec; 01/08/2019: 9 sec; 02/25/2019: 14sec    Time 4    Period Weeks    Status Achieved      PT LONG TERM GOAL #4   Title Pt will improve 6 MWT >2030ft to demonstrate improved cardiorespiratory fitness    Baseline Deferred to next session: 04/29/2018; 05/01/2018: 1430ft; 08/21/2018: 1455ft; 09/19/2018: 1469ft; 10/21/2018 1621; 11/21/2018 deferred to next session 2/2 time.; 01/08/2019: 1480 ft; 02/25/2019: 1339ft; 04/10/2019: 1400 ft, 05/13/19: 1321 ft; 07/21/2019: 1170 ft; 09/17/2019: 1222ft; 11/17/2019: 1240ft; 01/13/20: 6599; 03/15/20: 1218ft; 06/17/2020: 1227ft    Time 4    Period Weeks    Status On-going      PT LONG TERM GOAL #5   Title Patient will be able to run 222ft without stopping to better be able to return to recreational activites and address cardiovascular endurance.    Baseline 64ft; 02/25/2019: 26ft; 04/10/2019 deferred    Time 4    Period Weeks    Status Deferred      Additional Long Term Goals   Additional Long Term Goals Yes      PT LONG TERM GOAL #6   Title Patient will improve L hip flexion strength to 4/5 for improved steppage gait and reduced fall risk.    Baseline 04/10/2019: L hip flexion 3+/5; 07/21/2019: L Hip 4/5    Time 6    Period Weeks    Status Achieved      PT LONG TERM GOAL #7   Title Patient will be able to lift her affected L LE to the same extent as the R LE to allow for greater amount of foot clearance when ambulating    Baseline L LE: 10", R LE: 15"; 11/17/2019: L LE: 14", R LE 17.5"; 01/13/2020: L LE: 15.5", R LE 18"; 12/20: 18" B    Time 8    Period Weeks    Status Achieved      PT LONG TERM GOAL #8   Title Patient will have full dorsiflexion with 5/5 strength to allow for greater toe clearance with the performance of exercises, most notably when walking.    Baseline L 4/5 5 degree ; R 5/5 12 degree; L 4+/5 5 degree ; R 5/5 12 degree    Time 8    Period Weeks    Status  Achieved      PT LONG TERM GOAL  #9   TITLE Patient  will have no scuffing along L LE with performance of 6 min WT to indicate    Baseline 06/17/2020: No scuffing during today    Time 6    Period Weeks    Status Achieved      PT LONG TERM GOAL  #10   TITLE Patient will improve her ABC by 13% to indicate significant improvement in confidence with her balance    Baseline 56.25%    Time 6    Period Weeks    Status New      PT LONG TERM GOAL  #11   TITLE Patient will improve FGA by 3 points to indicate significant improvement in fucntioncal gait and balance with walking.    Baseline 26/30    Time 6    Period Weeks    Status New                 Plan - 07/14/20 4497    Clinical Impression Statement PT continued therex progression for increased BLE strength, endurance, and dynamic balance with success. Patient with difficulty with increased speed/power mobility d/t spasticity, therex adjusted accordingly. Pt is motivated throughout session and is able to complete all therex with proper technique following cuing with appropriate rest breaks.PT will continue progression as able.    Personal Factors and Comorbidities Past/Current Experience    Examination-Activity Limitations Locomotion Level    Examination-Participation Restrictions Community Activity    Stability/Clinical Decision Making Stable/Uncomplicated    Clinical Decision Making Moderate    Rehab Potential Good    Clinical Impairments Affecting Rehab Potential (-) hx of MS; (+) good motivation    PT Frequency 2x / week    PT Duration 4 weeks    PT Treatment/Interventions Moist Heat;Iontophoresis 4mg /ml Dexamethasone;Electrical Stimulation;Aquatic Therapy;Cryotherapy;Therapeutic activities;Neuromuscular re-education;Therapeutic exercise;Patient/family education;Dry needling;Manual techniques;Passive range of motion;Balance training;Functional mobility training;Gait training;Stair training;Joint Manipulations;Prosthetic Training     PT Next Visit Plan Dynamic balance and LE strength exercise    PT Home Exercise Plan hip flexion in sitting for strengthening, hip flexion knee flexion in standing for balance, bike intervals at 70%-75% max HR    Consulted and Agree with Plan of Care Patient           Patient will benefit from skilled therapeutic intervention in order to improve the following deficits and impairments:  Pain,Decreased coordination,Impaired perceived functional ability,Increased fascial restricitons,Increased muscle spasms,Difficulty walking,Abnormal gait,Decreased balance,Decreased mobility,Impaired sensation,Increased edema  Visit Diagnosis: Difficulty in walking, not elsewhere classified  Muscle weakness (generalized)  History of falling     Problem List There are no problems to display for this patient.  Durwin Reges DPT Durwin Reges 07/14/2020, 8:43 AM  [facility] [address] [place], [address] Phone: [phone]   Fax:  [phone]  Name: Michelle Ruiz MRN: [id number] Date of Birth: Jul 10, 1970

## 2020-07-22 ENCOUNTER — Other Ambulatory Visit: Payer: Self-pay

## 2020-07-22 ENCOUNTER — Encounter: Payer: Self-pay | Admitting: Physical Therapy

## 2020-07-22 ENCOUNTER — Ambulatory Visit: Payer: BC Managed Care – PPO | Admitting: Physical Therapy

## 2020-07-22 DIAGNOSIS — R262 Difficulty in walking, not elsewhere classified: Secondary | ICD-10-CM

## 2020-07-22 DIAGNOSIS — M6281 Muscle weakness (generalized): Secondary | ICD-10-CM

## 2020-07-22 NOTE — Therapy (Signed)
[facility] [address] [place], [address] Phone: [phone]   Fax:  [phone]  Physical Therapy Treatment  Patient Details  Name: Michelle Ruiz MRN: [id number] Date of Birth: July 23, 1970 No data recorded  Encounter Date: 07/22/2020   PT End of Session - 07/22/20 1748    Visit Number 128    Number of Visits 130    Date for PT Re-Evaluation 09/07/20    Authorization Type 8/10    PT Start Time 1062    PT Stop Time 1810    PT Time Calculation (min) 40 min    Equipment Utilized During Treatment Gait belt    Activity Tolerance Patient tolerated treatment well;No increased pain    Behavior During Therapy [facility] for tasks assessed/performed;Anxious           Past Medical History:  Diagnosis Date  . Abnormal Pap smear of cervix   . Actinic keratosis   . Anxiety   . Basal cell carcinoma 04/14/2008   Right upper ant. thigh. BCC with sclerosis.  . Basal cell carcinoma 08/14/2008   Right lat. lower thigh. Superficial.   . Basal cell carcinoma 04/29/2019   Right mid forearm. Superficial and nodular patterns. EDC.  Marland Kitchen Kidney stones   . MS (multiple sclerosis) ([place])   . UTI (urinary tract infection)     Past Surgical History:  Procedure Laterality Date  . BREAST BIOPSY Right 2010   stereotactic biopsy/ neg/ dr.byrnett  . BREAST BIOPSY Left 2012   neg/dr. brynett  . CESAREAN SECTION  2008   RPH  . TONSILLECTOMY  2006    There were no vitals filed for this visit.   Subjective Assessment - 07/22/20 1741    Subjective Patient reports pain in adductor group (says from pubic bone to knee) that has been persistent ove rhte past month 3/10 pain. Reports no other pain today. Compliance with HEP.    Patient is accompained by: Family member    Pertinent History Patient reports history of falls x3 in the past 6 months secondary to drop foot on the L LE. PMH: Multiple sclerosis; elbow fracture of the R UE     Limitations Standing;Walking;Lifting    How long can you stand comfortably? 20 min    How long can you walk comfortably? 1 miles    Patient Stated Goals To be more balanced and core strengthening     Pain Onset More than a month ago           TREATMENT Therapeutic Exercise Hip abduction in standing -swings -x 12 each  Hip extension in standing -swings -x 12 each TRX alternating reverse lunge -x 20 Hooklying adductor stretch 30sec hold bilat Wide childs pose 30sec not comfortable to patient Alt lateral lunge x12 with good stretch sensation at adductor  Bear crawl 2x 32ft with knees elevated 1in with min cuing to "keep bottom down" with decent carry over wt'd ball toss to rebounder 2x 12 narrow BOS with supervision for safety                           PT Education - 07/22/20 1747    Education Details therex form/technique    Person(s) Educated Patient    Methods Explanation;Demonstration;Verbal cues    Comprehension Verbalized understanding;Returned demonstration;Verbal cues required               PT Long Term Goals - 06/17/20 1702  PT LONG TERM GOAL #1   Title Patient will be independent with balance HEP to continue benefits of therapy after discharge.    Baseline dependent with form/technique for balance exercise; 03/25/2018: Independent     Time 6    Period Weeks    Status Achieved      PT LONG TERM GOAL #2   Title Patient will improve FGA by 4 points to indicate significant improvement with balancing while ambulating and decrease of fall risk    Baseline 03/25/2018: 28/30; 04/10/2019: 22; 07/21/2019: 29; 09/17/2019: 30/30    Time 6    Period Weeks    Status Achieved      PT LONG TERM GOAL #3   Title Patient will be able to perform prolonged single leg stance >10sec with eyes closed to indicate improvement with static balance and decrease fall risk    Baseline 2 sec in standing EC; 03/25/2018: 3 sec; 04/29/2018: 4 sec; 08/21/2018: 5 sec;  09/19/2018: 6 sec ; 10/21/2018 3 sec; 11/21/2018 5 sec; 01/08/2019: 9 sec; 02/25/2019: 14sec    Time 4    Period Weeks    Status Achieved      PT LONG TERM GOAL #4   Title Pt will improve 6 MWT >20105ft to demonstrate improved cardiorespiratory fitness    Baseline Deferred to next session: 04/29/2018; 05/01/2018: 1447ft; 08/21/2018: 1473ft; 09/19/2018: 1439ft; 10/21/2018 1621; 11/21/2018 deferred to next session 2/2 time.; 01/08/2019: 1480 ft; 02/25/2019: 1356ft; 04/10/2019: 1400 ft, 05/13/19: 1321 ft; 07/21/2019: 1170 ft; 09/17/2019: 1237ft; 11/17/2019: 1277ft; 01/13/20: 9983; 03/15/20: 1228ft; 06/17/2020: 1226ft    Time 4    Period Weeks    Status On-going      PT LONG TERM GOAL #5   Title Patient will be able to run 245ft without stopping to better be able to return to recreational activites and address cardiovascular endurance.    Baseline 5ft; 02/25/2019: 93ft; 04/10/2019 deferred    Time 4    Period Weeks    Status Deferred      Additional Long Term Goals   Additional Long Term Goals Yes      PT LONG TERM GOAL #6   Title Patient will improve L hip flexion strength to 4/5 for improved steppage gait and reduced fall risk.    Baseline 04/10/2019: L hip flexion 3+/5; 07/21/2019: L Hip 4/5    Time 6    Period Weeks    Status Achieved      PT LONG TERM GOAL #7   Title Patient will be able to lift her affected L LE to the same extent as the R LE to allow for greater amount of foot clearance when ambulating    Baseline L LE: 10", R LE: 15"; 11/17/2019: L LE: 14", R LE 17.5"; 01/13/2020: L LE: 15.5", R LE 18"; 12/20: 18" B    Time 8    Period Weeks    Status Achieved      PT LONG TERM GOAL #8   Title Patient will have full dorsiflexion with 5/5 strength to allow for greater toe clearance with the performance of exercises, most notably when walking.    Baseline L 4/5 5 degree ; R 5/5 12 degree; L 4+/5 5 degree ; R 5/5 12 degree    Time 8    Period Weeks    Status Achieved      PT LONG TERM GOAL   #9   TITLE Patient will have no scuffing along L LE with performance of 6 min  WT to indicate    Baseline 06/17/2020: No scuffing during today    Time 6    Period Weeks    Status Achieved      PT LONG TERM GOAL  #10   TITLE Patient will improve her ABC by 13% to indicate significant improvement in confidence with her balance    Baseline 56.25%    Time 6    Period Weeks    Status New      PT LONG TERM GOAL  #11   TITLE Patient will improve FGA by 3 points to indicate significant improvement in fucntioncal gait and balance with walking.    Baseline 26/30    Time 6    Period Weeks    Status New                 Plan - 07/22/20 1812    Clinical Impression Statement PT continued therex progression for increased BLE and core strength and endurance needed for ADLs and community activity with success. PT addressed tension in L adductor group as well, with stretch given for HEP update with good demonstrative understanding. PT will continue monitoring this throughout sessions. Pt is able to comply with all cuing for proper technique of all therex with good motivation throughout session. PT will cotninue progression as able.    Personal Factors and Comorbidities Past/Current Experience    Examination-Activity Limitations Locomotion Level    Examination-Participation Restrictions Community Activity    Stability/Clinical Decision Making Stable/Uncomplicated    Clinical Decision Making Moderate    Rehab Potential Good    Clinical Impairments Affecting Rehab Potential (-) hx of MS; (+) good motivation    PT Frequency 2x / week    PT Duration 4 weeks    PT Treatment/Interventions Moist Heat;Iontophoresis 4mg /ml Dexamethasone;Electrical Stimulation;Aquatic Therapy;Cryotherapy;Therapeutic activities;Neuromuscular re-education;Therapeutic exercise;Patient/family education;Dry needling;Manual techniques;Passive range of motion;Balance training;Functional mobility training;Gait training;Stair  training;Joint Manipulations;Prosthetic Training    PT Next Visit Plan Dynamic balance and LE strength exercise    PT Home Exercise Plan hip flexion in sitting for strengthening, hip flexion knee flexion in standing for balance, bike intervals at 70%-75% max HR    Consulted and Agree with Plan of Care Patient           Patient will benefit from skilled therapeutic intervention in order to improve the following deficits and impairments:  Pain,Decreased coordination,Impaired perceived functional ability,Increased fascial restricitons,Increased muscle spasms,Difficulty walking,Abnormal gait,Decreased balance,Decreased mobility,Impaired sensation,Increased edema  Visit Diagnosis: Difficulty in walking, not elsewhere classified  Muscle weakness (generalized)     Problem List There are no problems to display for this patient.  Durwin Reges DPT Durwin Reges 07/22/2020, 6:14 PM  [facility] [address] [place], [address] Phone: [phone]   Fax:  [phone]  Name: Michelle Ruiz MRN: [id number] Date of Birth: May 04, 1970

## 2020-07-28 ENCOUNTER — Other Ambulatory Visit: Payer: Self-pay | Admitting: Neurology

## 2020-07-28 DIAGNOSIS — G35 Multiple sclerosis: Secondary | ICD-10-CM

## 2020-07-29 ENCOUNTER — Other Ambulatory Visit: Payer: Self-pay

## 2020-07-29 ENCOUNTER — Encounter: Payer: Self-pay | Admitting: Physical Therapy

## 2020-07-29 ENCOUNTER — Ambulatory Visit: Payer: BC Managed Care – PPO | Attending: Neurology | Admitting: Physical Therapy

## 2020-07-29 DIAGNOSIS — Z9181 History of falling: Secondary | ICD-10-CM | POA: Diagnosis present

## 2020-07-29 DIAGNOSIS — M6281 Muscle weakness (generalized): Secondary | ICD-10-CM | POA: Insufficient documentation

## 2020-07-29 DIAGNOSIS — R262 Difficulty in walking, not elsewhere classified: Secondary | ICD-10-CM | POA: Insufficient documentation

## 2020-07-29 NOTE — Therapy (Signed)
[facility] [address], [place], [address] Phone: [phone]   Fax:  [phone]  Physical Therapy Treatment  Patient Details  Name: Michelle Ruiz MRN: [id number] Date of Birth: 1970-09-16 No data recorded  Encounter Date: 07/29/2020   PT End of Session - 07/29/20 1749    Visit Number 129    Number of Visits 130    Date for PT Re-Evaluation 09/07/20    Authorization Type 9/10    PT Start Time 4097    PT Stop Time 1810    PT Time Calculation (min) 40 min    Equipment Utilized During Treatment Gait belt    Activity Tolerance Patient tolerated treatment well;No increased pain    Behavior During Therapy [facility] for tasks assessed/performed;Anxious           Past Medical History:  Diagnosis Date  . Abnormal Pap smear of cervix   . Actinic keratosis   . Anxiety   . Basal cell carcinoma 04/14/2008   Right upper ant. thigh. BCC with sclerosis.  . Basal cell carcinoma 08/14/2008   Right lat. lower thigh. Superficial.   . Basal cell carcinoma 04/29/2019   Right mid forearm. Superficial and nodular patterns. EDC.  Marland Kitchen Kidney stones   . MS (multiple sclerosis) ([place])   . UTI (urinary tract infection)     Past Surgical History:  Procedure Laterality Date  . BREAST BIOPSY Right 2010   stereotactic biopsy/ neg/ dr.byrnett  . BREAST BIOPSY Left 2012   neg/dr. brynett  . CESAREAN SECTION  2008   RPH  . TONSILLECTOMY  2006    There were no vitals filed for this visit.   Subjective Assessment - 07/29/20 1745    Subjective Reports the stretch given last time is helping her adductor tension. Has some lateral hip soreness today. Went to her doctor and had some changes made to her medications which are complicated.    Patient is accompained by: Family member    Pertinent History Patient reports history of falls x3 in the past 6 months secondary to drop foot on the L LE. PMH: Multiple sclerosis; elbow fracture of  the R UE    Limitations Standing;Walking;Lifting    How long can you stand comfortably? 20 min    How long can you walk comfortably? 1 miles    Patient Stated Goals To be more balanced and core strengthening     Pain Onset More than a month ago             TREATMENT Therapeutic Exercise Hip abduction in standing -swings -x 12 each  Hip extension in standing -swings -x 12 each Squat ball slams 2x 12 with cuing for power slam with good carry over Walking lunge 18ft 10# x2 trials with min cuing for increased depth with good carry over Deadlift 30# KB 2x 10 with cuing for initial technique with good carry over Alt cone taps (2 stacked cones) on foam x12; alt taps Bear crawl 2x 70ft with knees elevated 1in with min cuing to "keep bottom down" with decent carry over wt'd ball toss to rebounder 2x 12 narrow BOS with supervision for safety                         PT Education - 07/29/20 1748    Education Details therex form/technique    Person(s) Educated Patient    Methods Explanation;Demonstration;Verbal cues    Comprehension  Verbalized understanding;Returned demonstration;Verbal cues required               PT Long Term Goals - 06/17/20 1702      PT LONG TERM GOAL #1   Title Patient will be independent with balance HEP to continue benefits of therapy after discharge.    Baseline dependent with form/technique for balance exercise; 03/25/2018: Independent     Time 6    Period Weeks    Status Achieved      PT LONG TERM GOAL #2   Title Patient will improve FGA by 4 points to indicate significant improvement with balancing while ambulating and decrease of fall risk    Baseline 03/25/2018: 28/30; 04/10/2019: 22; 07/21/2019: 29; 09/17/2019: 30/30    Time 6    Period Weeks    Status Achieved      PT LONG TERM GOAL #3   Title Patient will be able to perform prolonged single leg stance >10sec with eyes closed to indicate improvement with static balance and  decrease fall risk    Baseline 2 sec in standing EC; 03/25/2018: 3 sec; 04/29/2018: 4 sec; 08/21/2018: 5 sec; 09/19/2018: 6 sec ; 10/21/2018 3 sec; 11/21/2018 5 sec; 01/08/2019: 9 sec; 02/25/2019: 14sec    Time 4    Period Weeks    Status Achieved      PT LONG TERM GOAL #4   Title Pt will improve 6 MWT >2039ft to demonstrate improved cardiorespiratory fitness    Baseline Deferred to next session: 04/29/2018; 05/01/2018: 1438ft; 08/21/2018: 1441ft; 09/19/2018: 1418ft; 10/21/2018 1621; 11/21/2018 deferred to next session 2/2 time.; 01/08/2019: 1480 ft; 02/25/2019: 1328ft; 04/10/2019: 1400 ft, 05/13/19: 1321 ft; 07/21/2019: 1170 ft; 09/17/2019: 1240ft; 11/17/2019: 1220ft; 01/13/20: 9509; 03/15/20: 128ft; 06/17/2020: 1224ft    Time 4    Period Weeks    Status On-going      PT LONG TERM GOAL #5   Title Patient will be able to run 276ft without stopping to better be able to return to recreational activites and address cardiovascular endurance.    Baseline 47ft; 02/25/2019: 53ft; 04/10/2019 deferred    Time 4    Period Weeks    Status Deferred      Additional Long Term Goals   Additional Long Term Goals Yes      PT LONG TERM GOAL #6   Title Patient will improve L hip flexion strength to 4/5 for improved steppage gait and reduced fall risk.    Baseline 04/10/2019: L hip flexion 3+/5; 07/21/2019: L Hip 4/5    Time 6    Period Weeks    Status Achieved      PT LONG TERM GOAL #7   Title Patient will be able to lift her affected L LE to the same extent as the R LE to allow for greater amount of foot clearance when ambulating    Baseline L LE: 10", R LE: 15"; 11/17/2019: L LE: 14", R LE 17.5"; 01/13/2020: L LE: 15.5", R LE 18"; 12/20: 18" B    Time 8    Period Weeks    Status Achieved      PT LONG TERM GOAL #8   Title Patient will have full dorsiflexion with 5/5 strength to allow for greater toe clearance with the performance of exercises, most notably when walking.    Baseline L 4/5 5 degree ; R 5/5 12 degree; L  4+/5 5 degree ; R 5/5 12 degree    Time 8    Period Weeks  Status Achieved      PT LONG TERM GOAL  #9   TITLE Patient will have no scuffing along L LE with performance of 6 min WT to indicate    Baseline 06/17/2020: No scuffing during today    Time 6    Period Weeks    Status Achieved      PT LONG TERM GOAL  #10   TITLE Patient will improve her ABC by 13% to indicate significant improvement in confidence with her balance    Baseline 56.25%    Time 6    Period Weeks    Status New      PT LONG TERM GOAL  #11   TITLE Patient will improve FGA by 3 points to indicate significant improvement in fucntioncal gait and balance with walking.    Baseline 26/30    Time 6    Period Weeks    Status New                 Plan - 07/29/20 1756    Clinical Impression Statement PT continued therex progression for increased generalized strength and tolerance, and dynamic balance to reduce fall risk. Patient is able to tolerate therex progression with no increased pain. Patient is able to comply with all cuing for proper technique of therex with good motivation throughout session. PT provides gaurding for safety as needed. PT will continue progression as able.    Personal Factors and Comorbidities Past/Current Experience    Examination-Activity Limitations Locomotion Level    Examination-Participation Restrictions Community Activity    Stability/Clinical Decision Making Stable/Uncomplicated    Clinical Decision Making Moderate    Rehab Potential Good    Clinical Impairments Affecting Rehab Potential (-) hx of MS; (+) good motivation    PT Frequency 2x / week    PT Duration 4 weeks    PT Treatment/Interventions Moist Heat;Iontophoresis 4mg /ml Dexamethasone;Electrical Stimulation;Aquatic Therapy;Cryotherapy;Therapeutic activities;Neuromuscular re-education;Therapeutic exercise;Patient/family education;Dry needling;Manual techniques;Passive range of motion;Balance training;Functional mobility  training;Gait training;Stair training;Joint Manipulations;Prosthetic Training    PT Next Visit Plan Dynamic balance and LE strength exercise    PT Home Exercise Plan hip flexion in sitting for strengthening, hip flexion knee flexion in standing for balance, bike intervals at 70%-75% max HR    Consulted and Agree with Plan of Care Patient           Patient will benefit from skilled therapeutic intervention in order to improve the following deficits and impairments:  Pain,Decreased coordination,Impaired perceived functional ability,Increased fascial restricitons,Increased muscle spasms,Difficulty walking,Abnormal gait,Decreased balance,Decreased mobility,Impaired sensation,Increased edema  Visit Diagnosis: Difficulty in walking, not elsewhere classified  Muscle weakness (generalized)  History of falling     Problem List There are no problems to display for this patient.  Durwin Reges DPT Durwin Reges 07/29/2020, 6:18 PM  [facility] [address], [place], [address] Phone: [phone]   Fax:  [phone]  Name: Michelle Ruiz MRN: [id number] Date of Birth: [birth year]

## 2020-08-05 ENCOUNTER — Ambulatory Visit: Payer: BC Managed Care – PPO | Admitting: Physical Therapy

## 2020-08-05 ENCOUNTER — Other Ambulatory Visit: Payer: Self-pay

## 2020-08-05 ENCOUNTER — Encounter: Payer: Self-pay | Admitting: Physical Therapy

## 2020-08-05 DIAGNOSIS — R262 Difficulty in walking, not elsewhere classified: Secondary | ICD-10-CM | POA: Diagnosis not present

## 2020-08-05 DIAGNOSIS — M6281 Muscle weakness (generalized): Secondary | ICD-10-CM

## 2020-08-05 NOTE — Therapy (Signed)
[facility] [address], [place], [address] Phone: [phone]   Fax:  [phone]  Physical Therapy Treatment/Progress Note Reporting Period 06/17/20 - 08/06/20  Patient Details  Name: Michelle Ruiz MRN: [id number] Date of Birth: 09/09/1970 No data recorded  Encounter Date: 08/05/2020   PT End of Session - 08/05/20 1739    Visit Number 130    Number of Visits 130    Date for PT Re-Evaluation 09/07/20    Authorization Type 10/10    PT Start Time 9563    PT Stop Time 1810    PT Time Calculation (min) 40 min    Equipment Utilized During Treatment Gait belt    Activity Tolerance Patient tolerated treatment well;No increased pain    Behavior During Therapy [facility] for tasks assessed/performed;Anxious           Past Medical History:  Diagnosis Date  . Abnormal Pap smear of cervix   . Actinic keratosis   . Anxiety   . Basal cell carcinoma 04/14/2008   Right upper ant. thigh. BCC with sclerosis.  . Basal cell carcinoma 08/14/2008   Right lat. lower thigh. Superficial.   . Basal cell carcinoma 04/29/2019   Right mid forearm. Superficial and nodular patterns. EDC.  Marland Kitchen Kidney stones   . MS (multiple sclerosis) ([place])   . UTI (urinary tract infection)     Past Surgical History:  Procedure Laterality Date  . BREAST BIOPSY Right 2010   stereotactic biopsy/ neg/ dr.byrnett  . BREAST BIOPSY Left 2012   neg/dr. brynett  . CESAREAN SECTION  2008   RPH  . TONSILLECTOMY  2006    There were no vitals filed for this visit.   Subjective Assessment - 08/05/20 1737    Subjective Reports she is doing well overall. No pain or falls since last visit. Reports compliance with HEP.    Patient is accompained by: Family member    Pertinent History Patient reports history of falls x3 in the past 6 months secondary to drop foot on the L LE. PMH: Multiple sclerosis; elbow fracture of the R UE    Limitations  Standing;Walking;Lifting    How long can you stand comfortably? 20 min    How long can you walk comfortably? 1 miles    Patient Stated Goals To be more balanced and core strengthening     Pain Onset More than a month ago           TREATMENT Therapeutic Exercise Hip abduction in standing -swings -x12 each Hip extension in standing -swings -x12 each Squat kettlebell swing 20# 3x 6 with good carry over of demo, min cuing to "use legs" FGA dynamic walking balance scale with guarding for technique SL reaching to tap 3 stacked cones 2x 6 each LE with cuing for balance with some carry over Alt forwardlunge onto bosu ball 2x 12 with unilateral UE support with good success         PT Education - 08/05/20 1738    Education Details therex form/technique    Person(s) Educated Patient    Methods Explanation;Verbal cues;Demonstration    Comprehension Verbalized understanding;Returned demonstration;Verbal cues required               PT Long Term Goals - 08/05/20 1740      PT LONG TERM GOAL #1   Title Patient will be independent with balance HEP to continue benefits of therapy after discharge.    Baseline dependent  with form/technique for balance exercise; 03/25/2018: Independent     Time 6    Period Weeks    Status Achieved      PT LONG TERM GOAL #2   Title Patient will improve FGA by 4 points to indicate significant improvement with balancing while ambulating and decrease of fall risk    Baseline 03/25/2018: 28/30; 04/10/2019: 22; 07/21/2019: 29; 09/17/2019: 30/30    Time 6    Period Weeks    Status Achieved      PT LONG TERM GOAL #3   Title Patient will be able to perform prolonged single leg stance >10sec with eyes closed to indicate improvement with static balance and decrease fall risk    Baseline 2 sec in standing EC; 03/25/2018: 3 sec; 04/29/2018: 4 sec; 08/21/2018: 5 sec; 09/19/2018: 6 sec ; 10/21/2018 3 sec; 11/21/2018 5 sec; 01/08/2019: 9 sec; 02/25/2019: 14sec     Time 4    Period Weeks    Status Achieved      PT LONG TERM GOAL #4   Title Pt will improve 6 MWT >2030ft to demonstrate improved cardiorespiratory fitness    Baseline Deferred to next session: 04/29/2018; 05/01/2018: 1423ft; 08/21/2018: 1478ft; 09/19/2018: 1487ft; 10/21/2018 1621; 11/21/2018 deferred to next session 2/2 time.; 01/08/2019: 1480 ft; 02/25/2019: 1338ft; 04/10/2019: 1400 ft, 05/13/19: 1321 ft; 07/21/2019: 1170 ft; 09/17/2019: 1283ft; 11/17/2019: 1264ft; 01/13/20: 4782; 03/15/20: 1228ft; 06/17/2020: 1266ft    Time 4    Period Weeks    Status Deferred      PT LONG TERM GOAL #5   Title Patient will be able to run 261ft without stopping to better be able to return to recreational activites and address cardiovascular endurance.    Baseline 81ft; 02/25/2019: 55ft; 04/10/2019 deferred    Time 4    Period Weeks    Status Deferred      PT LONG TERM GOAL #6   Title Patient will improve L hip flexion strength to 4/5 for improved steppage gait and reduced fall risk.    Baseline 04/10/2019: L hip flexion 3+/5; 07/21/2019: L Hip 4/5    Time 6    Period Weeks    Status Achieved      PT LONG TERM GOAL #7   Title Patient will be able to lift her affected L LE to the same extent as the R LE to allow for greater amount of foot clearance when ambulating    Baseline L LE: 10", R LE: 15"; 11/17/2019: L LE: 14", R LE 17.5"; 01/13/2020: L LE: 15.5", R LE 18"; 12/20: 18" B    Time 8    Period Weeks    Status Achieved      PT LONG TERM GOAL #8   Title Patient will have full dorsiflexion with 5/5 strength to allow for greater toe clearance with the performance of exercises, most notably when walking.    Baseline L 4/5 5 degree ; R 5/5 12 degree; L 4+/5 5 degree ; R 5/5 12 degree    Time 8    Period Weeks    Status Achieved      PT LONG TERM GOAL  #9   TITLE Patient will have no scuffing along L LE with performance of 6 min WT to indicate    Baseline 06/17/2020: No scuffing during today    Time 6     Period Weeks    Status Achieved      PT LONG TERM GOAL  #10   TITLE Patient will  improve her ABC by 13% to indicate significant improvement in confidence with her balance    Baseline 56.25%; 08/05/20 86.2%    Time 6    Period Weeks    Status Achieved      PT LONG TERM GOAL  #11   TITLE Patient will improve FGA by 3 points to indicate significant improvement in fucntioncal gait and balance with walking.    Baseline 08/05/20 22/30    Time 6    Period Weeks    Status On-going                 Plan - 08/05/20 1809    Clinical Impression Statement PT reassessed dynamic balance with FGA measure, and ABC balance confidence scale. Patient continues to demonstrate difficulty with dynamic balance, and endurance, increasing her fall risk and decreased endurance needed for community activity. She has increased her perceived confidence, which is good. Pt is able to comply with all cuing for proper technique of all therex with good motivation throughout session.    Personal Factors and Comorbidities Past/Current Experience    Examination-Activity Limitations Locomotion Level    Examination-Participation Restrictions Community Activity    Stability/Clinical Decision Making Stable/Uncomplicated    Clinical Decision Making Moderate    Rehab Potential Good    Clinical Impairments Affecting Rehab Potential (-) hx of MS; (+) good motivation    PT Frequency 2x / week    PT Duration 4 weeks    PT Treatment/Interventions Moist Heat;Iontophoresis 4mg /ml Dexamethasone;Electrical Stimulation;Aquatic Therapy;Cryotherapy;Therapeutic activities;Neuromuscular re-education;Therapeutic exercise;Patient/family education;Dry needling;Manual techniques;Passive range of motion;Balance training;Functional mobility training;Gait training;Stair training;Joint Manipulations;Prosthetic Training    PT Next Visit Plan Dynamic balance and LE strength exercise    PT Home Exercise Plan hip flexion in sitting for  strengthening, hip flexion knee flexion in standing for balance, bike intervals at 70%-75% max HR    Consulted and Agree with Plan of Care Patient           Patient will benefit from skilled therapeutic intervention in order to improve the following deficits and impairments:  Pain,Decreased coordination,Impaired perceived functional ability,Increased fascial restricitons,Increased muscle spasms,Difficulty walking,Abnormal gait,Decreased balance,Decreased mobility,Impaired sensation,Increased edema  Visit Diagnosis: Difficulty in walking, not elsewhere classified  Muscle weakness (generalized)     Problem List There are no problems to display for this patient.  Durwin Reges DPT Durwin Reges 08/06/2020, 8:56 AM  [facility] [address] [place], [address] Phone: [phone]   Fax:  [phone]  Name: Michelle Ruiz MRN: [id number] Date of Birth: February 23, 1971

## 2020-08-12 ENCOUNTER — Encounter: Payer: Self-pay | Admitting: Physical Therapy

## 2020-08-12 ENCOUNTER — Ambulatory Visit: Payer: BC Managed Care – PPO | Admitting: Physical Therapy

## 2020-08-12 ENCOUNTER — Other Ambulatory Visit: Payer: Self-pay

## 2020-08-12 DIAGNOSIS — R262 Difficulty in walking, not elsewhere classified: Secondary | ICD-10-CM

## 2020-08-12 DIAGNOSIS — M6281 Muscle weakness (generalized): Secondary | ICD-10-CM

## 2020-08-12 NOTE — Therapy (Signed)
[facility] [address], [place], [address] Phone: [phone]   Fax:  [phone]  Physical Therapy Treatment  Patient Details  Name: Michelle Ruiz MRN: [id number] Date of Birth: March 25, 1971 No data recorded  Encounter Date: 08/12/2020   PT End of Session - 08/12/20 1742    Visit Number 131    Number of Visits 130    Date for PT Re-Evaluation 09/07/20    Authorization Type 1/10    PT Start Time 3810    PT Stop Time 1803    PT Time Calculation (min) 39 min    Activity Tolerance Patient tolerated treatment well;No increased pain    Behavior During Therapy [facility] for tasks assessed/performed;Anxious           Past Medical History:  Diagnosis Date  . Abnormal Pap smear of cervix   . Actinic keratosis   . Anxiety   . Basal cell carcinoma 04/14/2008   Right upper ant. thigh. BCC with sclerosis.  . Basal cell carcinoma 08/14/2008   Right lat. lower thigh. Superficial.   . Basal cell carcinoma 04/29/2019   Right mid forearm. Superficial and nodular patterns. EDC.  Marland Kitchen Kidney stones   . MS (multiple sclerosis) ([place])   . UTI (urinary tract infection)     Past Surgical History:  Procedure Laterality Date  . BREAST BIOPSY Right 2010   stereotactic biopsy/ neg/ dr.byrnett  . BREAST BIOPSY Left 2012   neg/dr. brynett  . CESAREAN SECTION  2008   RPH  . TONSILLECTOMY  2006    There were no vitals filed for this visit.   Subjective Assessment - 08/12/20 1735    Subjective Is doing well overall. Has not been able to complete her HEP this week, but has not had adduction pain this week.    Patient is accompained by: Family member    Pertinent History Patient reports history of falls x3 in the past 6 months secondary to drop foot on the L LE. PMH: Multiple sclerosis; elbow fracture of the R UE    Limitations Standing;Walking;Lifting    How long can you stand comfortably? 20 min    How long can you walk  comfortably? 1 miles    Patient Stated Goals To be more balanced and core strengthening     Pain Onset More than a month ago           TREATMENT Therapeutic Exercise Hip abduction in standing -swings -x12 each Hip extension in standing -swings -x12 each Wt/d ball toss/catch from side standing on foam x12 each side; min cuing for hip rotation/force with good carry over Squat on bosu ball 2x 15 with good carry over of set up cuing Walking lunge 56ft 10# x2 trials with min cuing for increased depth with good carry over Deadlift 30# KB 2x 12 with cuing for initial technique with good carry over Lateral band walks RTB 2x 53ft with min cuing for full step with good carry over                           PT Education - 08/12/20 1742    Education Details therex form/technique    Person(s) Educated Patient    Methods Explanation;Verbal cues;Demonstration    Comprehension Verbalized understanding;Returned demonstration;Verbal cues required               PT Long Term Goals - 08/05/20 1740  PT LONG TERM GOAL #1   Title Patient will be independent with balance HEP to continue benefits of therapy after discharge.    Baseline dependent with form/technique for balance exercise; 03/25/2018: Independent     Time 6    Period Weeks    Status Achieved      PT LONG TERM GOAL #2   Title Patient will improve FGA by 4 points to indicate significant improvement with balancing while ambulating and decrease of fall risk    Baseline 03/25/2018: 28/30; 04/10/2019: 22; 07/21/2019: 29; 09/17/2019: 30/30    Time 6    Period Weeks    Status Achieved      PT LONG TERM GOAL #3   Title Patient will be able to perform prolonged single leg stance >10sec with eyes closed to indicate improvement with static balance and decrease fall risk    Baseline 2 sec in standing EC; 03/25/2018: 3 sec; 04/29/2018: 4 sec; 08/21/2018: 5 sec; 09/19/2018: 6 sec ; 10/21/2018 3 sec; 11/21/2018 5 sec;  01/08/2019: 9 sec; 02/25/2019: 14sec    Time 4    Period Weeks    Status Achieved      PT LONG TERM GOAL #4   Title Pt will improve 6 MWT >2075ft to demonstrate improved cardiorespiratory fitness    Baseline Deferred to next session: 04/29/2018; 05/01/2018: 1472ft; 08/21/2018: 1469ft; 09/19/2018: 1479ft; 10/21/2018 1621; 11/21/2018 deferred to next session 2/2 time.; 01/08/2019: 1480 ft; 02/25/2019: 1315ft; 04/10/2019: 1400 ft, 05/13/19: 1321 ft; 07/21/2019: 1170 ft; 09/17/2019: 1264ft; 11/17/2019: 122ft; 01/13/20: 3382; 03/15/20: 1235ft; 06/17/2020: 1250ft    Time 4    Period Weeks    Status Deferred      PT LONG TERM GOAL #5   Title Patient will be able to run 253ft without stopping to better be able to return to recreational activites and address cardiovascular endurance.    Baseline 80ft; 02/25/2019: 6ft; 04/10/2019 deferred    Time 4    Period Weeks    Status Deferred      PT LONG TERM GOAL #6   Title Patient will improve L hip flexion strength to 4/5 for improved steppage gait and reduced fall risk.    Baseline 04/10/2019: L hip flexion 3+/5; 07/21/2019: L Hip 4/5    Time 6    Period Weeks    Status Achieved      PT LONG TERM GOAL #7   Title Patient will be able to lift her affected L LE to the same extent as the R LE to allow for greater amount of foot clearance when ambulating    Baseline L LE: 10", R LE: 15"; 11/17/2019: L LE: 14", R LE 17.5"; 01/13/2020: L LE: 15.5", R LE 18"; 12/20: 18" B    Time 8    Period Weeks    Status Achieved      PT LONG TERM GOAL #8   Title Patient will have full dorsiflexion with 5/5 strength to allow for greater toe clearance with the performance of exercises, most notably when walking.    Baseline L 4/5 5 degree ; R 5/5 12 degree; L 4+/5 5 degree ; R 5/5 12 degree    Time 8    Period Weeks    Status Achieved      PT LONG TERM GOAL  #9   TITLE Patient will have no scuffing along L LE with performance of 6 min WT to indicate    Baseline 06/17/2020: No  scuffing during today    Time  6    Period Weeks    Status Achieved      PT LONG TERM GOAL  #10   TITLE Patient will improve her ABC by 13% to indicate significant improvement in confidence with her balance    Baseline 56.25%; 08/05/20 86.2%    Time 6    Period Weeks    Status Achieved      PT LONG TERM GOAL  #11   TITLE Patient will improve FGA by 3 points to indicate significant improvement in fucntioncal gait and balance with walking.    Baseline 08/05/20 22/30    Time 6    Period Weeks    Status On-going                 Plan - 08/12/20 1759    Clinical Impression Statement PT continued therex progression for increased strengthening and balance with success. Patient is able to comply with all cuing for proper technique of therex with good motivation throughout session. Pt with no increased pain throughout session. Pt requires predicted rest breaks. PT will continue progression as able.    Personal Factors and Comorbidities Past/Current Experience    Examination-Activity Limitations Locomotion Level    Examination-Participation Restrictions Community Activity    Stability/Clinical Decision Making Stable/Uncomplicated    Clinical Decision Making Moderate    Rehab Potential Good    Clinical Impairments Affecting Rehab Potential (-) hx of MS; (+) good motivation    PT Frequency 2x / week    PT Duration 4 weeks    PT Treatment/Interventions Moist Heat;Iontophoresis 4mg /ml Dexamethasone;Electrical Stimulation;Aquatic Therapy;Cryotherapy;Therapeutic activities;Neuromuscular re-education;Therapeutic exercise;Patient/family education;Dry needling;Manual techniques;Passive range of motion;Balance training;Functional mobility training;Gait training;Stair training;Joint Manipulations;Prosthetic Training    PT Next Visit Plan Dynamic balance and LE strength exercise    PT Home Exercise Plan hip flexion in sitting for strengthening, hip flexion knee flexion in standing for balance, bike  intervals at 70%-75% max HR    Consulted and Agree with Plan of Care Patient           Patient will benefit from skilled therapeutic intervention in order to improve the following deficits and impairments:  Pain,Decreased coordination,Impaired perceived functional ability,Increased fascial restricitons,Increased muscle spasms,Difficulty walking,Abnormal gait,Decreased balance,Decreased mobility,Impaired sensation,Increased edema  Visit Diagnosis: Difficulty in walking, not elsewhere classified  Muscle weakness (generalized)     Problem List There are no problems to display for this patient.  Durwin Reges DPT Durwin Reges 08/12/2020, 6:04 PM  [facility] [address], [place], [address] Phone: [phone]   Fax:  [phone]  Name: Michelle Ruiz MRN: [id number] Date of Birth: May 24, 1970

## 2020-08-17 ENCOUNTER — Other Ambulatory Visit: Payer: Self-pay

## 2020-08-17 ENCOUNTER — Ambulatory Visit: Payer: BC Managed Care – PPO | Admitting: Dermatology

## 2020-08-17 DIAGNOSIS — Z85828 Personal history of other malignant neoplasm of skin: Secondary | ICD-10-CM

## 2020-08-17 DIAGNOSIS — Z1283 Encounter for screening for malignant neoplasm of skin: Secondary | ICD-10-CM | POA: Diagnosis not present

## 2020-08-17 DIAGNOSIS — D489 Neoplasm of uncertain behavior, unspecified: Secondary | ICD-10-CM

## 2020-08-17 DIAGNOSIS — D1800 Hemangioma unspecified site: Secondary | ICD-10-CM

## 2020-08-17 DIAGNOSIS — C44612 Basal cell carcinoma of skin of right upper limb, including shoulder: Secondary | ICD-10-CM | POA: Diagnosis not present

## 2020-08-17 DIAGNOSIS — L821 Other seborrheic keratosis: Secondary | ICD-10-CM

## 2020-08-17 DIAGNOSIS — D225 Melanocytic nevi of trunk: Secondary | ICD-10-CM

## 2020-08-17 DIAGNOSIS — D2262 Melanocytic nevi of left upper limb, including shoulder: Secondary | ICD-10-CM

## 2020-08-17 DIAGNOSIS — D229 Melanocytic nevi, unspecified: Secondary | ICD-10-CM

## 2020-08-17 DIAGNOSIS — L309 Dermatitis, unspecified: Secondary | ICD-10-CM | POA: Diagnosis not present

## 2020-08-17 DIAGNOSIS — L814 Other melanin hyperpigmentation: Secondary | ICD-10-CM

## 2020-08-17 DIAGNOSIS — L578 Other skin changes due to chronic exposure to nonionizing radiation: Secondary | ICD-10-CM

## 2020-08-17 DIAGNOSIS — L570 Actinic keratosis: Secondary | ICD-10-CM | POA: Diagnosis not present

## 2020-08-17 MED ORDER — HYDROCORTISONE 2.5 % EX CREA
TOPICAL_CREAM | Freq: Two times a day (BID) | CUTANEOUS | 1 refills | Status: AC | PRN
Start: 2020-08-17 — End: ?

## 2020-08-17 NOTE — Progress Notes (Signed)
Follow-Up Visit   Subjective  Michelle Ruiz is a 50 y.o. female who presents for the following: 6 month follow up (Patient here today for 6 month follow up for tbse. Patient has a crusty spot at side of right nose. Hands has rash that she has noticed for some time and is spreading to fingers. Right earlobe has a crusty spot. Patient would also like discuss age spots removed. Patient does have history of bcc and aks. )  Patient here for full body skin exam and skin cancer screening.  The following portions of the chart were reviewed this encounter and updated as appropriate:        Objective  Well appearing patient in no apparent distress; mood and affect are within normal limits.  A full examination was performed including scalp, head, eyes, ears, nose, lips, neck, chest, axillae, abdomen, back, buttocks, bilateral upper extremities, bilateral lower extremities, hands, feet, fingers, toes, fingernails, and toenails. All findings within normal limits unless otherwise noted below.  Objective  right upper arm: Waxy tan macule   Images    Objective  Right upper Antecubital: 6 x 4 mm pearly pink papule      Objective  right nasal root: Pink scaly macule nose Rest of face clear  Objective  Right Earlobe: Pink scaly patch R earlobe Pink patches on index and 3rd finger at base   Objective  Left Shoulder - Posterior: 4 mm brown macule with darker center - photo today   left mid back: 5 x 3 mm medium brown macule   Images        Assessment & Plan  Seborrheic keratosis right upper arm  Discussed cosmetic procedure (cryotherapy), noncovered.  $60 for 1st lesion and $15 for each additional lesion if done on the same day.  Maximum charge $350.  One touch-up treatment included no charge. Discussed risks of treatment including dyspigmentation, small scar, and/or recurrence. Recommend daily broad spectrum sunscreen SPF 30+/photoprotection to treated areas once  healed.    Destruction of lesion - right upper arm  Destruction method: cryotherapy   Informed consent: discussed and consent obtained   Lesion destroyed using liquid nitrogen: Yes   Region frozen until ice ball extended beyond lesion: Yes   Outcome: patient tolerated procedure well with no complications   Post-procedure details: wound care instructions given    Neoplasm of uncertain behavior Right upper Antecubital  Skin / nail biopsy Type of biopsy: tangential   Informed consent: discussed and consent obtained   Patient was prepped and draped in usual sterile fashion: Area prepped with alcohol. Anesthesia: the lesion was anesthetized in a standard fashion   Anesthetic:  1% lidocaine w/ epinephrine 1-100,000 buffered w/ 8.4% NaHCO3 Instrument used: flexible razor blade   Hemostasis achieved with: pressure, aluminum chloride and electrodesiccation   Outcome: patient tolerated procedure well    Destruction of lesion  Destruction method: electrodesiccation and curettage   Informed consent: discussed and consent obtained   Timeout:  patient name, date of birth, surgical site, and procedure verified Curettage performed in three different directions: Yes   Electrodesiccation performed over the curetted area: Yes   Lesion length (cm):  0.5 Lesion width (cm):  0.5 Margin per side (cm):  0.1 Final wound size (cm):  0.7 Hemostasis achieved with:  pressure, aluminum chloride and electrodesiccation Outcome: patient tolerated procedure well with no complications   Post-procedure details: wound care instructions given   Additional details:  Mupirocin ointment and Bandaid applied  Specimen 1 - Surgical pathology Differential Diagnosis: r/o bcc   Check Margins: No 6 x 4 mm pearly pink papule  R/o bcc   Actinic keratosis right nasal root  Plan to treat with LN2 at follow up (pt has important work Microbiologist)   Good result with PDT on face   Dermatitis Right  Earlobe   Hct 2.5% cream apply on 1 - 2 times daily as needed on earlobe and fingers  Topical steroids (such as triamcinolone, fluocinolone, fluocinonide, mometasone, clobetasol, halobetasol, betamethasone, hydrocortisone) can cause thinning and lightening of the skin if they are used for too long in the same area. Your physician has selected the right strength medicine for your problem and area affected on the body. Please use your medication only as directed by your physician to prevent side effects.     hydrocortisone 2.5 % cream - Right Earlobe  Nevus (2) Left Shoulder - Posterior; left mid back  Benign-appearing.  Observation.  Call clinic for new or changing lesions.  Recommend daily use of broad spectrum spf 30+ sunscreen to sun-exposed areas.    Lentigines - Scattered tan macules - Due to sun exposure - Benign-appering, observe - Recommend daily broad spectrum sunscreen SPF 30+ to sun-exposed areas, reapply every 2 hours as needed. - Call for any changes  Seborrheic Keratoses - Stuck-on, waxy, tan-brown papules and/or plaques  - Benign-appearing - Discussed benign etiology and prognosis. - Observe - Call for any changes  Melanocytic Nevi - Tan-brown and/or pink-flesh-colored symmetric macules and papules - Benign appearing on exam today - Observation - Call clinic for new or changing moles - Recommend daily use of broad spectrum spf 30+ sunscreen to sun-exposed areas.   Hemangiomas - Red papules - Discussed benign nature - Observe - Call for any changes  Actinic Damage - Chronic condition, secondary to cumulative UV/sun exposure - diffuse scaly erythematous macules with underlying dyspigmentation - Recommend daily broad spectrum sunscreen SPF 30+ to sun-exposed areas, reapply every 2 hours as needed.  - Staying in the shade or wearing long sleeves, sun glasses (UVA+UVB protection) and wide brim hats (4-inch brim around the entire circumference of the hat) are  also recommended for sun protection.  - Call for new or changing lesions.  History of Basal Cell Carcinoma of the Skin - No evidence of recurrence today at right upper anterior thigh 2010, right lateral lower thigh 2010, and right mid forearm 2021 - Recommend regular full body skin exams - Recommend daily broad spectrum sunscreen SPF 30+ to sun-exposed areas, reapply every 2 hours as needed.  - Call if any new or changing lesions are noted between office visits   Skin cancer screening performed today.  Return in about 6 months (around 02/17/2021) for 6 month cosmetic sk removal and tbse .  I, Ruthell Rummage, CMA, am acting as scribe for Brendolyn Patty, MD.  Documentation: I have reviewed the above documentation for accuracy and completeness, and I agree with the above.  Brendolyn Patty MD

## 2020-08-17 NOTE — Patient Instructions (Addendum)
Biopsy Wound Care Instructions  1. Leave the original bandage on for 24 hours if possible.  If the bandage becomes soaked or soiled before that time, it is OK to remove it and examine the wound.  A small amount of post-operative bleeding is normal.  If excessive bleeding occurs, remove the bandage, place gauze over the site and apply continuous pressure (no peeking) over the area for 30 minutes. If this does not work, please call our clinic as soon as possible or page your doctor if it is after hours.   2. Once a day, cleanse the wound with soap and water. It is fine to shower. If a thick crust develops you may use a Q-tip dipped into dilute hydrogen peroxide (mix 1:1 with water) to dissolve it.  Hydrogen peroxide can slow the healing process, so use it only as needed.    3. After washing, apply petroleum jelly (Vaseline) or an antibiotic ointment if your doctor prescribed one for you, followed by a bandage.    4. For best healing, the wound should be covered with a layer of ointment at all times. If you are not able to keep the area covered with a bandage to hold the ointment in place, this may mean re-applying the ointment several times a day.  Continue this wound care until the wound has healed and is no longer open.   Itching and mild discomfort is normal during the healing process. However, if you develop pain or severe itching, please call our office.   If you have any discomfort, you can take Tylenol (acetaminophen) or ibuprofen as directed on the bottle. (Please do not take these if you have an allergy to them or cannot take them for another reason).  Some redness, tenderness and white or yellow material in the wound is normal healing.  If the area becomes very sore and red, or develops a thick yellow-green material (pus), it may be infected; please notify us.    If you have stitches, return to clinic as directed to have the stitches removed. You will continue wound care for 2-3 days after  the stitches are removed.   Wound healing continues for up to one year following surgery. It is not unusual to experience pain in the scar from time to time during the interval.  If the pain becomes severe or the scar thickens, you should notify the office.    A slight amount of redness in a scar is expected for the first six months.  After six months, the redness will fade and the scar will soften and fade.  The color difference becomes less noticeable with time.  If there are any problems, return for a post-op surgery check at your earliest convenience.  To improve the appearance of the scar, you can use silicone scar gel, cream, or sheets (such as Mederma or Serica) every night for up to one year. These are available over the counter (without a prescription).  Please call our office at [phone] for any questions or concerns.  Electrodesiccation and Curettage ("Scrape and Burn") Wound Care Instructions  1. Leave the original bandage on for 24 hours if possible.  If the bandage becomes soaked or soiled before that time, it is OK to remove it and examine the wound.  A small amount of post-operative bleeding is normal.  If excessive bleeding occurs, remove the bandage, place gauze over the site and apply continuous pressure (no peeking) over the area for 30 minutes. If this does not  work, please call our clinic as soon as possible or page your doctor if it is after hours.   2. Once a day, cleanse the wound with soap and water. It is fine to shower. If a thick crust develops you may use a Q-tip dipped into dilute hydrogen peroxide (mix 1:1 with water) to dissolve it.  Hydrogen peroxide can slow the healing process, so use it only as needed.    3. After washing, apply petroleum jelly (Vaseline) or an antibiotic ointment if your doctor prescribed one for you, followed by a bandage.    4. For best healing, the wound should be covered with a layer of ointment at all times. If you are not able to  keep the area covered with a bandage to hold the ointment in place, this may mean re-applying the ointment several times a day.  Continue this wound care until the wound has healed and is no longer open. It may take several weeks for the wound to heal and close.  Itching and mild discomfort is normal during the healing process.  If you have any discomfort, you can take Tylenol (acetaminophen) or ibuprofen as directed on the bottle. (Please do not take these if you have an allergy to them or cannot take them for another reason).  Some redness, tenderness and white or yellow material in the wound is normal healing.  If the area becomes very sore and red, or develops a thick yellow-green material (pus), it may be infected; please notify us.    Wound healing continues for up to one year following surgery. It is not unusual to experience pain in the scar from time to time during the interval.  If the pain becomes severe or the scar thickens, you should notify the office.    A slight amount of redness in a scar is expected for the first six months.  After six months, the redness will fade and the scar will soften and fade.  The color difference becomes less noticeable with time.  If there are any problems, return for a post-op surgery check at your earliest convenience.  To improve the appearance of the scar, you can use silicone scar gel, cream, or sheets (such as Mederma or Serica) every night for up to one year. These are available over the counter (without a prescription).  Please call our office at [phone] for any questions or concerns.    Topical steroids (such as triamcinolone, fluocinolone, fluocinonide, mometasone, clobetasol, halobetasol, betamethasone, hydrocortisone) can cause thinning and lightening of the skin if they are used for too long in the same area. Your physician has selected the right strength medicine for your problem and area affected on the body. Please use your  medication only as directed by your physician to prevent side effects.   Actinic keratoses are precancerous spots that appear secondary to cumulative UV radiation exposure/sun exposure over time. They are chronic with expected duration over 1 year. A portion of actinic keratoses will progress to squamous cell carcinoma of the skin. It is not possible to reliably predict which spots will progress to skin cancer and so treatment is recommended to prevent development of skin cancer.  Recommend daily broad spectrum sunscreen SPF 30+ to sun-exposed areas, reapply every 2 hours as needed.  Recommend staying in the shade or wearing long sleeves, sun glasses (UVA+UVB protection) and wide brim hats (4-inch brim around the entire circumference of the hat). Call for new or changing lesions.  Seborrheic Keratosis  What  causes seborrheic keratoses? Seborrheic keratoses are harmless, common skin growths that first appear during adult life.  As time goes by, more growths appear.  Some people may develop a large number of them.  Seborrheic keratoses appear on both covered and uncovered body parts.  They are not caused by sunlight.  The tendency to develop seborrheic keratoses can be inherited.  They vary in color from skin-colored to gray, brown, or even black.  They can be either smooth or have a rough, warty surface.   Seborrheic keratoses are superficial and look as if they were stuck on the skin.  Under the microscope this type of keratosis looks like layers upon layers of skin.  That is why at times the top layer may seem to fall off, but the rest of the growth remains and re-grows.    Treatment Seborrheic keratoses do not need to be treated, but can easily be removed in the office.  Seborrheic keratoses often cause symptoms when they rub on clothing or jewelry.  Lesions can be in the way of shaving.  If they become inflamed, they can cause itching, soreness, or burning.  Removal of a seborrheic keratosis can be  accomplished by freezing, burning, or surgery. If any spot bleeds, scabs, or grows rapidly, please return to have it checked, as these can be an indication of a skin cancer.  Cryotherapy Aftercare  . Wash gently with soap and water everyday.   Marland Kitchen Apply Vaseline and Band-Aid daily until healed.  Melanoma ABCDEs  Melanoma is the most dangerous type of skin cancer, and is the leading cause of death from skin disease.  You are more likely to develop melanoma if you:  Have light-colored skin, light-colored eyes, or red or blond hair  Spend a lot of time in the sun  Tan regularly, either outdoors or in a tanning bed  Have had blistering sunburns, especially during childhood  Have a close family member who has had a melanoma  Have atypical moles or large birthmarks  Early detection of melanoma is key since treatment is typically straightforward and cure rates are extremely high if we catch it early.   The first sign of melanoma is often a change in a mole or a new dark spot.  The ABCDE system is a way of remembering the signs of melanoma.  A for asymmetry:  The two halves do not match. B for border:  The edges of the growth are irregular. C for color:  A mixture of colors are present instead of an even brown color. D for diameter:  Melanomas are usually (but not always) greater than 71mm - the size of a pencil eraser. E for evolution:  The spot keeps changing in size, shape, and color.  Please check your skin once per month between visits. You can use a small mirror in front and a large mirror behind you to keep an eye on the back side or your body.   If you see any new or changing lesions before your next follow-up, please call to schedule a visit.  Please continue daily skin protection including broad spectrum sunscreen SPF 30+ to sun-exposed areas, reapplying every 2 hours as needed when you're outdoors.   Staying in the shade or wearing long sleeves, sun glasses (UVA+UVB  protection) and wide brim hats (4-inch brim around the entire circumference of the hat) are also recommended for sun protection.

## 2020-08-19 ENCOUNTER — Ambulatory Visit: Payer: BC Managed Care – PPO | Admitting: Physical Therapy

## 2020-08-26 ENCOUNTER — Encounter: Payer: Self-pay | Admitting: Physical Therapy

## 2020-08-26 ENCOUNTER — Ambulatory Visit: Payer: BC Managed Care – PPO | Attending: Neurology | Admitting: Physical Therapy

## 2020-08-26 ENCOUNTER — Other Ambulatory Visit: Payer: Self-pay

## 2020-08-26 DIAGNOSIS — M6281 Muscle weakness (generalized): Secondary | ICD-10-CM | POA: Diagnosis present

## 2020-08-26 DIAGNOSIS — R262 Difficulty in walking, not elsewhere classified: Secondary | ICD-10-CM | POA: Insufficient documentation

## 2020-08-26 NOTE — Therapy (Signed)
[facility] [address] [place], [address] Phone: [phone]   Fax:  [phone]  Physical Therapy Treatment  Patient Details  Name: Michelle Ruiz MRN: [id number] Date of Birth: 1970-05-14 No data recorded  Encounter Date: 08/26/2020   PT End of Session - 08/26/20 1743    Visit Number 132    Number of Visits 130    Date for PT Re-Evaluation 09/07/20    Authorization Type 2/10    PT Start Time 0626    PT Stop Time 1810    PT Time Calculation (min) 40 min    Equipment Utilized During Treatment Gait belt    Activity Tolerance Patient tolerated treatment well;No increased pain    Behavior During Therapy [facility] for tasks assessed/performed;Anxious           Past Medical History:  Diagnosis Date  . Abnormal Pap smear of cervix   . Actinic keratosis   . Anxiety   . Basal cell carcinoma 04/14/2008   Right upper ant. thigh. BCC with sclerosis.  . Basal cell carcinoma 08/14/2008   Right lat. lower thigh. Superficial.   . Basal cell carcinoma 04/29/2019   Right mid forearm. Superficial and nodular patterns. EDC.  Marland Kitchen Kidney stones   . MS (multiple sclerosis) ([place])   . UTI (urinary tract infection)     Past Surgical History:  Procedure Laterality Date  . BREAST BIOPSY Right 2010   stereotactic biopsy/ neg/ dr.byrnett  . BREAST BIOPSY Left 2012   neg/dr. brynett  . CESAREAN SECTION  2008   [facility]  . TONSILLECTOMY  2006    There were no vitals filed for this visit.   Subjective Assessment - 08/26/20 1738    Subjective Is doing well overall. Has been completing HEP. Nothing new since last visit    Patient is accompained by: Family member    Pertinent History Patient reports history of falls x3 in the past 6 months secondary to drop foot on the L LE. PMH: Multiple sclerosis; elbow fracture of the R UE    Limitations Standing;Walking;Lifting    How long can you stand comfortably? 20 min    How long can  you walk comfortably? 1 miles    Patient Stated Goals To be more balanced and core strengthening     Pain Onset More than a month ago             TREATMENT Therapeutic Exercise Hip abduction in standing -swings -x12 each Hip extension in standing -swings -x12 each Squat with KB swing 20# 2x 10 with good carry over following demo  Walking lunges with pink theraball overhead 2x 69ft with supervision - CGA for safety with cuing for increased depth with decent carry over Lateral band walks RTB 2x 61ft with BUE flex with pink tball; min cuing for eccentric control  Bear crawls in qped with 1in knee lift 2x 65ft Squat on bosu ball x15 with good carry over of set up cuing             PT Education - 08/26/20 1742    Education Details therex form/technique    Person(s) Educated Patient    Methods Explanation;Demonstration;Verbal cues    Comprehension Verbalized understanding;Returned demonstration;Verbal cues required               PT Long Term Goals - 08/05/20 1740      PT LONG TERM GOAL #1   Title Patient will be independent  with balance HEP to continue benefits of therapy after discharge.    Baseline dependent with form/technique for balance exercise; 03/25/2018: Independent     Time 6    Period Weeks    Status Achieved      PT LONG TERM GOAL #2   Title Patient will improve FGA by 4 points to indicate significant improvement with balancing while ambulating and decrease of fall risk    Baseline 03/25/2018: 28/30; 04/10/2019: 22; 07/21/2019: 29; 09/17/2019: 30/30    Time 6    Period Weeks    Status Achieved      PT LONG TERM GOAL #3   Title Patient will be able to perform prolonged single leg stance >10sec with eyes closed to indicate improvement with static balance and decrease fall risk    Baseline 2 sec in standing EC; 03/25/2018: 3 sec; 04/29/2018: 4 sec; 08/21/2018: 5 sec; 09/19/2018: 6 sec ; 10/21/2018 3 sec; 11/21/2018 5 sec; 01/08/2019: 9 sec; 02/25/2019:  14sec    Time 4    Period Weeks    Status Achieved      PT LONG TERM GOAL #4   Title Pt will improve 6 MWT >2074ft to demonstrate improved cardiorespiratory fitness    Baseline Deferred to next session: 04/29/2018; 05/01/2018: 1447ft; 08/21/2018: 1484ft; 09/19/2018: 1457ft; 10/21/2018 1621; 11/21/2018 deferred to next session 2/2 time.; 01/08/2019: 1480 ft; 02/25/2019: 1360ft; 04/10/2019: 1400 ft, 05/13/19: 1321 ft; 07/21/2019: 1170 ft; 09/17/2019: 1275ft; 11/17/2019: 1271ft; 01/13/20: 8546; 03/15/20: 1268ft; 06/17/2020: 1213ft    Time 4    Period Weeks    Status Deferred      PT LONG TERM GOAL #5   Title Patient will be able to run 225ft without stopping to better be able to return to recreational activites and address cardiovascular endurance.    Baseline 52ft; 02/25/2019: 42ft; 04/10/2019 deferred    Time 4    Period Weeks    Status Deferred      PT LONG TERM GOAL #6   Title Patient will improve L hip flexion strength to 4/5 for improved steppage gait and reduced fall risk.    Baseline 04/10/2019: L hip flexion 3+/5; 07/21/2019: L Hip 4/5    Time 6    Period Weeks    Status Achieved      PT LONG TERM GOAL #7   Title Patient will be able to lift her affected L LE to the same extent as the R LE to allow for greater amount of foot clearance when ambulating    Baseline L LE: 10", R LE: 15"; 11/17/2019: L LE: 14", R LE 17.5"; 01/13/2020: L LE: 15.5", R LE 18"; 12/20: 18" B    Time 8    Period Weeks    Status Achieved      PT LONG TERM GOAL #8   Title Patient will have full dorsiflexion with 5/5 strength to allow for greater toe clearance with the performance of exercises, most notably when walking.    Baseline L 4/5 5 degree ; R 5/5 12 degree; L 4+/5 5 degree ; R 5/5 12 degree    Time 8    Period Weeks    Status Achieved      PT LONG TERM GOAL  #9   TITLE Patient will have no scuffing along L LE with performance of 6 min WT to indicate    Baseline 06/17/2020: No scuffing during today    Time 6     Period Weeks    Status Achieved  PT LONG TERM GOAL  #10   TITLE Patient will improve her ABC by 13% to indicate significant improvement in confidence with her balance    Baseline 56.25%; 08/05/20 86.2%    Time 6    Period Weeks    Status Achieved      PT LONG TERM GOAL  #11   TITLE Patient will improve FGA by 3 points to indicate significant improvement in fucntioncal gait and balance with walking.    Baseline 08/05/20 22/30    Time 6    Period Weeks    Status On-going                 Plan - 08/27/20 6606    Clinical Impression Statement PT continued therex progression for increased core and hip strengthening, and balance with success. Patient is able to comply with proper technique of all therex with good motivation throughout session. Patient requires expected rest breaks following therex sets. Patient requires minimal increased time for coordination of alt movements, and therex involving UEs + LEs. PT will continue progression as able.    Personal Factors and Comorbidities Past/Current Experience    Examination-Activity Limitations Locomotion Level    Examination-Participation Restrictions Community Activity    Stability/Clinical Decision Making Stable/Uncomplicated    Clinical Decision Making Moderate    Rehab Potential Good    Clinical Impairments Affecting Rehab Potential (-) hx of MS; (+) good motivation    PT Frequency 2x / week    PT Duration 4 weeks    PT Treatment/Interventions Moist Heat;Iontophoresis 4mg /ml Dexamethasone;Electrical Stimulation;Aquatic Therapy;Cryotherapy;Therapeutic activities;Neuromuscular re-education;Therapeutic exercise;Patient/family education;Dry needling;Manual techniques;Passive range of motion;Balance training;Functional mobility training;Gait training;Stair training;Joint Manipulations;Prosthetic Training    PT Next Visit Plan Dynamic balance and LE strength exercise    PT Home Exercise Plan hip flexion in sitting for strengthening,  hip flexion knee flexion in standing for balance, bike intervals at 70%-75% max HR    Consulted and Agree with Plan of Care Patient           Patient will benefit from skilled therapeutic intervention in order to improve the following deficits and impairments:  Pain,Decreased coordination,Impaired perceived functional ability,Increased fascial restricitons,Increased muscle spasms,Difficulty walking,Abnormal gait,Decreased balance,Decreased mobility,Impaired sensation,Increased edema  Visit Diagnosis: Difficulty in walking, not elsewhere classified  Muscle weakness (generalized)     Problem List There are no problems to display for this patient.  Durwin Reges DPT Durwin Reges 08/27/2020, 8:34 AM  [facility] [address] [place], [address] Phone: [phone]   Fax:  [phone]  Name: Michelle Ruiz MRN: [id number] Date of Birth: 28-Apr-1970

## 2020-08-27 ENCOUNTER — Other Ambulatory Visit: Payer: Self-pay | Admitting: Obstetrics and Gynecology

## 2020-08-27 DIAGNOSIS — Z1231 Encounter for screening mammogram for malignant neoplasm of breast: Secondary | ICD-10-CM

## 2020-08-30 ENCOUNTER — Telehealth: Payer: Self-pay

## 2020-08-30 NOTE — Telephone Encounter (Signed)
-----   Message from Brendolyn Patty, MD sent at 08/30/2020  9:08 AM EDT ----- Skin , Right upper antecubital BASAL CELL CARCINOMA, NODULAR PATTERN, CLOSE TO MARGIN  BCC skin cancer- already treated with EDC at time of biopsy

## 2020-08-30 NOTE — Telephone Encounter (Signed)
Advised patient biopsy on the right upper antecubitum was [facility] and was treated with EDC. Patient to keep f/u appt.

## 2020-08-31 ENCOUNTER — Encounter: Payer: Self-pay | Admitting: Physical Therapy

## 2020-08-31 ENCOUNTER — Other Ambulatory Visit: Payer: Self-pay

## 2020-08-31 ENCOUNTER — Ambulatory Visit: Payer: BC Managed Care – PPO | Admitting: Physical Therapy

## 2020-08-31 DIAGNOSIS — R262 Difficulty in walking, not elsewhere classified: Secondary | ICD-10-CM

## 2020-08-31 DIAGNOSIS — M6281 Muscle weakness (generalized): Secondary | ICD-10-CM

## 2020-08-31 NOTE — Therapy (Signed)
[place] [facility] [address] [place], [address] Phone: [phone]   Fax:  [phone]  Physical Therapy Treatment  Patient Details  Name: Michelle Ruiz MRN: [id number] Date of Birth: 1970/10/14 No data recorded  Encounter Date: 08/31/2020   PT End of Session - 08/31/20 1717    Visit Number 133    Number of Visits 135    Date for PT Re-Evaluation 09/07/20    Authorization Type 3/10    PT Start Time 1645    PT Stop Time 1730    PT Time Calculation (min) 45 min    Equipment Utilized During Treatment Gait belt    Activity Tolerance Patient tolerated treatment well;No increased pain    Behavior During Therapy [facility] for tasks assessed/performed;Anxious           Past Medical History:  Diagnosis Date  . Abnormal Pap smear of cervix   . Actinic keratosis   . Anxiety   . Basal cell carcinoma 04/14/2008   Right upper ant. thigh. BCC with sclerosis.  . Basal cell carcinoma 08/14/2008   Right lat. lower thigh. Superficial.   . Basal cell carcinoma 04/29/2019   Right mid forearm. Superficial and nodular patterns. EDC.  . Basal cell carcinoma 08/17/2020   Right upper antecubital. EDC.  Marland Kitchen Kidney stones   . MS (multiple sclerosis) ([place])   . UTI (urinary tract infection)     Past Surgical History:  Procedure Laterality Date  . BREAST BIOPSY Right 2010   stereotactic biopsy/ neg/ dr.byrnett  . BREAST BIOPSY Left 2012   neg/dr. brynett  . CESAREAN SECTION  2008   [facility]  . TONSILLECTOMY  2006    There were no vitals filed for this visit.   Subjective Assessment - 08/31/20 1653    Subjective Is doing well overall. No pain today, no updates since last visit.    Patient is accompained by: Family member    Pertinent History Patient reports history of falls x3 in the past 6 months secondary to drop foot on the L LE. PMH: Multiple sclerosis; elbow fracture of the R UE    Limitations Standing;Walking;Lifting    How  long can you stand comfortably? 20 min    How long can you walk comfortably? 1 miles    Patient Stated Goals To be more balanced and core strengthening     Pain Onset More than a month ago           TREATMENT Therapeutic Exercise Hip abduction in standing -swings -x12 each Hip extension in standing -swings -x12 each SL TRX  Squat x12 bilat with min cuing for technique with chair behind for safety Squat with ball slam 3# x15 with min cuing for increased depth of squat Sitting on tball alt leg lift with opposite UE rotational lift 1kg ball 2x 12 with cuing for core engagement SL cone touch x6 bilat with cones at semi circle in front of patient colors called out at random with CGA for safety occasional minA  Bear crawls in qped with 1in knee lift 2x 72ft cuing for                              PT Education - 08/31/20 1655    Education Details therex form/technique    Person(s) Educated Patient    Methods Explanation;Demonstration;Verbal cues    Comprehension Verbalized understanding;Returned demonstration;Verbal cues required  PT Long Term Goals - 08/05/20 1740      PT LONG TERM GOAL #1   Title Patient will be independent with balance HEP to continue benefits of therapy after discharge.    Baseline dependent with form/technique for balance exercise; 03/25/2018: Independent     Time 6    Period Weeks    Status Achieved      PT LONG TERM GOAL #2   Title Patient will improve FGA by 4 points to indicate significant improvement with balancing while ambulating and decrease of fall risk    Baseline 03/25/2018: 28/30; 04/10/2019: 22; 07/21/2019: 29; 09/17/2019: 30/30    Time 6    Period Weeks    Status Achieved      PT LONG TERM GOAL #3   Title Patient will be able to perform prolonged single leg stance >10sec with eyes closed to indicate improvement with static balance and decrease fall risk    Baseline 2 sec in standing EC;  03/25/2018: 3 sec; 04/29/2018: 4 sec; 08/21/2018: 5 sec; 09/19/2018: 6 sec ; 10/21/2018 3 sec; 11/21/2018 5 sec; 01/08/2019: 9 sec; 02/25/2019: 14sec    Time 4    Period Weeks    Status Achieved      PT LONG TERM GOAL #4   Title Pt will improve 6 MWT >2081ft to demonstrate improved cardiorespiratory fitness    Baseline Deferred to next session: 04/29/2018; 05/01/2018: 1470ft; 08/21/2018: 1460ft; 09/19/2018: 148ft; 10/21/2018 1621; 11/21/2018 deferred to next session 2/2 time.; 01/08/2019: 1480 ft; 02/25/2019: 1373ft; 04/10/2019: 1400 ft, 05/13/19: 1321 ft; 07/21/2019: 1170 ft; 09/17/2019: 1280ft; 11/17/2019: 1237ft; 01/13/20: 8185; 03/15/20: 1237ft; 06/17/2020: 1268ft    Time 4    Period Weeks    Status Deferred      PT LONG TERM GOAL #5   Title Patient will be able to run 222ft without stopping to better be able to return to recreational activites and address cardiovascular endurance.    Baseline 110ft; 02/25/2019: 18ft; 04/10/2019 deferred    Time 4    Period Weeks    Status Deferred      PT LONG TERM GOAL #6   Title Patient will improve L hip flexion strength to 4/5 for improved steppage gait and reduced fall risk.    Baseline 04/10/2019: L hip flexion 3+/5; 07/21/2019: L Hip 4/5    Time 6    Period Weeks    Status Achieved      PT LONG TERM GOAL #7   Title Patient will be able to lift her affected L LE to the same extent as the R LE to allow for greater amount of foot clearance when ambulating    Baseline L LE: 10", R LE: 15"; 11/17/2019: L LE: 14", R LE 17.5"; 01/13/2020: L LE: 15.5", R LE 18"; 12/20: 18" B    Time 8    Period Weeks    Status Achieved      PT LONG TERM GOAL #8   Title Patient will have full dorsiflexion with 5/5 strength to allow for greater toe clearance with the performance of exercises, most notably when walking.    Baseline L 4/5 5 degree ; R 5/5 12 degree; L 4+/5 5 degree ; R 5/5 12 degree    Time 8    Period Weeks    Status Achieved      PT LONG TERM GOAL  #9   TITLE  Patient will have no scuffing along L LE with performance of 6 min WT to indicate  Baseline 06/17/2020: No scuffing during today    Time 6    Period Weeks    Status Achieved      PT LONG TERM GOAL  #10   TITLE Patient will improve her ABC by 13% to indicate significant improvement in confidence with her balance    Baseline 56.25%; 08/05/20 86.2%    Time 6    Period Weeks    Status Achieved      PT LONG TERM GOAL  #11   TITLE Patient will improve FGA by 3 points to indicate significant improvement in fucntioncal gait and balance with walking.    Baseline 08/05/20 22/30    Time 6    Period Weeks    Status On-going                 Plan - 09/01/20 1140    Clinical Impression Statement PT continued therex progression for increased core and hip strengthening, with carry over into dynamic balance to increase independence and safety with ADLs and community activity with success. Patine tis able to comply with multimodal cuing for propertechnique of therex. Patient is motivated throughout session without increased pain. PT will continue progression as able.    Personal Factors and Comorbidities Past/Current Experience    Examination-Activity Limitations Locomotion Level    Examination-Participation Restrictions Community Activity    Stability/Clinical Decision Making Stable/Uncomplicated    Clinical Decision Making Moderate    Rehab Potential Good    Clinical Impairments Affecting Rehab Potential (-) hx of MS; (+) good motivation    PT Frequency 2x / week    PT Duration 4 weeks    PT Treatment/Interventions Moist Heat;Iontophoresis 4mg /ml Dexamethasone;Electrical Stimulation;Aquatic Therapy;Cryotherapy;Therapeutic activities;Neuromuscular re-education;Therapeutic exercise;Patient/family education;Dry needling;Manual techniques;Passive range of motion;Balance training;Functional mobility training;Gait training;Stair training;Joint Manipulations;Prosthetic Training    PT Next Visit Plan  Dynamic balance and LE strength exercise    PT Home Exercise Plan hip flexion in sitting for strengthening, hip flexion knee flexion in standing for balance, bike intervals at 70%-75% max HR    Consulted and Agree with Plan of Care Patient           Patient will benefit from skilled therapeutic intervention in order to improve the following deficits and impairments:  Pain,Decreased coordination,Impaired perceived functional ability,Increased fascial restricitons,Increased muscle spasms,Difficulty walking,Abnormal gait,Decreased balance,Decreased mobility,Impaired sensation,Increased edema  Visit Diagnosis: Difficulty in walking, not elsewhere classified  Muscle weakness (generalized)     Problem List There are no problems to display for this patient.  Durwin Reges DPT Durwin Reges 09/01/2020, 11:45 AM  [place] [facility] [address], [place], [address] Phone: [phone]   Fax:  [phone]  Name: Michelle Ruiz MRN: [id number] Date of Birth: Feb 01, 1971

## 2020-09-02 ENCOUNTER — Encounter: Payer: BC Managed Care – PPO | Admitting: Physical Therapy

## 2020-09-07 ENCOUNTER — Encounter: Payer: Self-pay | Admitting: Physical Therapy

## 2020-09-07 ENCOUNTER — Ambulatory Visit: Payer: BC Managed Care – PPO | Admitting: Physical Therapy

## 2020-09-07 DIAGNOSIS — R262 Difficulty in walking, not elsewhere classified: Secondary | ICD-10-CM | POA: Diagnosis not present

## 2020-09-07 DIAGNOSIS — M6281 Muscle weakness (generalized): Secondary | ICD-10-CM

## 2020-09-07 NOTE — Therapy (Signed)
[place] [facility] [address] [place], [address] Phone: [phone]   Fax:  [phone]  Physical Therapy Treatment  Patient Details  Name: Michelle Ruiz MRN: [id number] Date of Birth: 25-Apr-1970 No data recorded  Encounter Date: 09/07/2020   PT End of Session - 09/07/20 1717     Visit Number 134    Number of Visits 135    Date for PT Re-Evaluation 09/07/20    Authorization Type 4/10    PT Start Time 1645    PT Stop Time 1725    PT Time Calculation (min) 40 min    Equipment Utilized During Treatment Gait belt    Activity Tolerance Patient tolerated treatment well;No increased pain    Behavior During Therapy [facility] for tasks assessed/performed;Anxious             Past Medical History:  Diagnosis Date   Abnormal Pap smear of cervix    Actinic keratosis    Anxiety    Basal cell carcinoma 04/14/2008   Right upper ant. thigh. BCC with sclerosis.   Basal cell carcinoma 08/14/2008   Right lat. lower thigh. Superficial.    Basal cell carcinoma 04/29/2019   Right mid forearm. Superficial and nodular patterns. EDC.   Basal cell carcinoma 08/17/2020   Right upper antecubital. EDC.   Kidney stones    MS (multiple sclerosis) ([place])    UTI (urinary tract infection)     Past Surgical History:  Procedure Laterality Date   BREAST BIOPSY Right 2010   stereotactic biopsy/ neg/ dr.byrnett   BREAST BIOPSY Left 2012   neg/dr. brynett   CESAREAN SECTION  2008   [facility]   TONSILLECTOMY  2006    There were no vitals filed for this visit.   Subjective Assessment - 09/07/20 1707     Subjective Pt is doing well overall. Enjoyed upper body workouts last session. REports increased fatigue following not sleeping well, and increased heat.    Patient is accompained by: Family member    Pertinent History Patient reports history of falls x3 in the past 6 months secondary to drop foot on the L LE. PMH: Multiple sclerosis;  elbow fracture of the R UE    Limitations Standing;Walking;Lifting    How long can you stand comfortably? 20 min    How long can you walk comfortably? 1 miles    Patient Stated Goals To be more balanced and core strengthening     Pain Onset More than a month ago               TREATMENT Therapeutic Exercise Hip abduction in standing - swings - x 12 each  Hip extension in standing - swings - x 12 each Walking lunge with pink therabll overhead 2x 12 with CGA for safety Abd slider in mini squat with pink theraball 90d BUE flex 2x 12 bilat with good carry over of demo, min cuing to "stay low" Squat with overhead lift 3# DB bilat min cuing for technique Birddog with sliders to aid in LE movement 2x 12 with cuing for set up and core activation with good carry over                           PT Education - 09/07/20 1716     Education Details therex form/technique    Person(s) Educated Patient    Methods Explanation;Demonstration;Verbal cues    Comprehension Verbalized understanding;Returned demonstration;Verbal  cues required                 PT Long Term Goals - 08/05/20 1740       PT LONG TERM GOAL #1   Title Patient will be independent with balance HEP to continue benefits of therapy after discharge.    Baseline dependent with form/technique for balance exercise; 03/25/2018: Independent     Time 6    Period Weeks    Status Achieved      PT LONG TERM GOAL #2   Title Patient will improve FGA by 4 points to indicate significant improvement with balancing while ambulating and decrease of fall risk    Baseline 03/25/2018: 28/30; 04/10/2019: 22; 07/21/2019: 29; 09/17/2019: 30/30    Time 6    Period Weeks    Status Achieved      PT LONG TERM GOAL #3   Title Patient will be able to perform prolonged single leg stance >10sec with eyes closed to indicate improvement with static balance and decrease fall risk    Baseline 2 sec in standing EC; 03/25/2018: 3  sec; 04/29/2018: 4 sec; 08/21/2018: 5 sec; 09/19/2018: 6 sec ; 10/21/2018 3 sec; 11/21/2018 5 sec; 01/08/2019: 9 sec; 02/25/2019: 14sec    Time 4    Period Weeks    Status Achieved      PT LONG TERM GOAL #4   Title Pt will improve 6 MWT >2032ft to demonstrate improved cardiorespiratory fitness    Baseline Deferred to next session: 04/29/2018; 05/01/2018: 1457ft; 08/21/2018: 1423ft; 09/19/2018: 1448ft; 10/21/2018 1621; 11/21/2018 deferred to next session 2/2 time.; 01/08/2019: 1480 ft; 02/25/2019: 1351ft; 04/10/2019: 1400 ft, 05/13/19: 1321 ft; 07/21/2019: 1170 ft; 09/17/2019: 1274ft; 11/17/2019: 1229ft; 01/13/20: 9735; 03/15/20: 1249ft; 06/17/2020: 1256ft    Time 4    Period Weeks    Status Deferred      PT LONG TERM GOAL #5   Title Patient will be able to run 236ft without stopping to better be able to return to recreational activites and address cardiovascular endurance.    Baseline 101ft; 02/25/2019: 16ft; 04/10/2019 deferred    Time 4    Period Weeks    Status Deferred      PT LONG TERM GOAL #6   Title Patient will improve L hip flexion strength to 4/5 for improved steppage gait and reduced fall risk.    Baseline 04/10/2019: L hip flexion 3+/5; 07/21/2019: L Hip 4/5    Time 6    Period Weeks    Status Achieved      PT LONG TERM GOAL #7   Title Patient will be able to lift her affected L LE to the same extent as the R LE to allow for greater amount of foot clearance when ambulating    Baseline L LE: 10", R LE: 15"; 11/17/2019: L LE: 14", R LE 17.5"; 01/13/2020: L LE: 15.5", R LE 18"; 12/20: 18" B    Time 8    Period Weeks    Status Achieved      PT LONG TERM GOAL #8   Title Patient will have full dorsiflexion with 5/5 strength to allow for greater toe clearance with the performance of exercises, most notably when walking.    Baseline L 4/5 5 degree ; R 5/5 12 degree; L 4+/5 5 degree ; R 5/5 12 degree    Time 8    Period Weeks    Status Achieved      PT LONG TERM GOAL  #9  TITLE Patient will have  no scuffing along L LE with performance of 6 min WT to indicate    Baseline 06/17/2020: No scuffing during today    Time 6    Period Weeks    Status Achieved      PT LONG TERM GOAL  #10   TITLE Patient will improve her ABC by 13% to indicate significant improvement in confidence with her balance    Baseline 56.25%; 08/05/20 86.2%    Time 6    Period Weeks    Status Achieved      PT LONG TERM GOAL  #11   TITLE Patient will improve FGA by 3 points to indicate significant improvement in fucntioncal gait and balance with walking.    Baseline 08/05/20 22/30    Time 6    Period Weeks    Status On-going                   Plan - 09/08/20 1023     Clinical Impression Statement PT continued therex progression with UE involvement as well for general strengthening, increased muscle recruitment with success. Patient is able to comply with cuing of therex with good carry over, expected fatigue with rest breaks utilized. Patient with increased fatigue and spasticity this session, more than likely d/t increased heat today, and increased fatigue d/t decreased sleep hygiene this week d/t work. PT will continue progression as able.    Personal Factors and Comorbidities Past/Current Experience    Examination-Activity Limitations Locomotion Level    Examination-Participation Restrictions Community Activity    Stability/Clinical Decision Making Stable/Uncomplicated    Clinical Decision Making Moderate    Rehab Potential Good    Clinical Impairments Affecting Rehab Potential (-) hx of MS; (+) good motivation    PT Frequency 2x / week    PT Duration 12 weeks    PT Treatment/Interventions Moist Heat;Iontophoresis 4mg /ml Dexamethasone;Electrical Stimulation;Aquatic Therapy;Cryotherapy;Therapeutic activities;Neuromuscular re-education;Therapeutic exercise;Patient/family education;Dry needling;Manual techniques;Passive range of motion;Balance training;Functional mobility training;Gait training;Stair  training;Joint Manipulations;Prosthetic Training    PT Next Visit Plan Dynamic balance and LE strength exercise    PT Home Exercise Plan hip flexion in sitting for strengthening, hip flexion knee flexion in standing for balance, bike intervals at 70%-75% max HR    Consulted and Agree with Plan of Care Patient             Patient will benefit from skilled therapeutic intervention in order to improve the following deficits and impairments:  Pain, Decreased coordination, Impaired perceived functional ability, Increased fascial restricitons, Increased muscle spasms, Difficulty walking, Abnormal gait, Decreased balance, Decreased mobility, Impaired sensation, Increased edema  Visit Diagnosis: Difficulty in walking, not elsewhere classified  Muscle weakness (generalized)     Problem List There are no problems to display for this patient.  Durwin Reges DPT Durwin Reges 09/08/2020, 10:29 AM  [place] [facility] [address] [place], [address] Phone: [phone]   Fax:  [phone]  Name: LYLLIAN GAUSE MRN: [id number] Date of Birth: 10/31/1970

## 2020-09-14 ENCOUNTER — Ambulatory Visit: Payer: BC Managed Care – PPO | Admitting: Physical Therapy

## 2020-09-15 ENCOUNTER — Ambulatory Visit
Admission: RE | Admit: 2020-09-15 | Discharge: 2020-09-15 | Disposition: A | Discharge status: Home / Self Care | Payer: BC Managed Care – PPO | Source: Ambulatory Visit | Attending: Obstetrics and Gynecology | Admitting: Obstetrics and Gynecology

## 2020-09-15 ENCOUNTER — Other Ambulatory Visit: Payer: Self-pay

## 2020-09-15 DIAGNOSIS — Z1231 Encounter for screening mammogram for malignant neoplasm of breast: Secondary | ICD-10-CM | POA: Insufficient documentation

## 2020-09-21 ENCOUNTER — Encounter: Payer: Self-pay | Admitting: Physical Therapy

## 2020-09-21 ENCOUNTER — Other Ambulatory Visit: Payer: Self-pay

## 2020-09-21 ENCOUNTER — Ambulatory Visit: Payer: BC Managed Care – PPO | Admitting: Physical Therapy

## 2020-09-21 DIAGNOSIS — M6281 Muscle weakness (generalized): Secondary | ICD-10-CM

## 2020-09-21 DIAGNOSIS — R262 Difficulty in walking, not elsewhere classified: Secondary | ICD-10-CM | POA: Diagnosis not present

## 2020-09-21 NOTE — Therapy (Signed)
[facility] [address], [place], [address] Phone: [phone]   Fax:  [phone]  Physical Therapy Treatment  Patient Details  Name: Michelle Ruiz MRN: [id number] Date of Birth: 06/02/1970 No data recorded  Encounter Date: 09/21/2020   PT End of Session - 09/21/20 1657     Visit Number 135    Number of Visits 200    Date for PT Re-Evaluation 12/14/20    Authorization Type 5/10    PT Start Time 1645    PT Stop Time 1725    PT Time Calculation (min) 40 min    Equipment Utilized During Treatment Gait belt    Activity Tolerance Patient tolerated treatment well;No increased pain    Behavior During Therapy [facility] for tasks assessed/performed;Anxious             Past Medical History:  Diagnosis Date   Abnormal Pap smear of cervix    Actinic keratosis    Anxiety    Basal cell carcinoma 04/14/2008   Right upper ant. thigh. BCC with sclerosis.   Basal cell carcinoma 08/14/2008   Right lat. lower thigh. Superficial.    Basal cell carcinoma 04/29/2019   Right mid forearm. Superficial and nodular patterns. EDC.   Basal cell carcinoma 08/17/2020   Right upper antecubital. EDC.   Kidney stones    MS (multiple sclerosis) ([place])    UTI (urinary tract infection)     Past Surgical History:  Procedure Laterality Date   BREAST BIOPSY Right 2010   stereotactic biopsy/ neg/ dr.byrnett   BREAST BIOPSY Left 2012   neg/dr. brynett   CESAREAN SECTION  2008   [facility]   TONSILLECTOMY  2006    There were no vitals filed for this visit.   Subjective Assessment - 09/21/20 1655     Subjective Pt reports she had some fatigue over the weekend after planning her dad's birthday party, and missed her last appt. No pain today.    Pertinent History Patient reports history of falls x3 in the past 6 months secondary to drop foot on the L LE. PMH: Multiple sclerosis; elbow fracture of the R UE    Limitations  Standing;Walking;Lifting    How long can you stand comfortably? 20 min    How long can you walk comfortably? 1 miles    Patient Stated Goals To be more balanced and core strengthening     Pain Onset More than a month ago             TREATMENT Therapeutic Exercise Hip abduction in standing - swings - x 12 each  Hip extension in standing - swings - x 12 each Alt step ups onto 8in step with pink tbal overhead press 2x 12 Monster walks (arms overhead with 2# bring opposite foot to hand with forward walk momentum) 2x 12 with CGA for safety  Abd slider in mini squat with 2# DB 90d BUE flex 2x 12 bilat with min cuing to maintain squat position  In mini lunge position torso rotations with 5# DB x12 each leg forward; CGA for safety 1x instance of minA to reestablish balance                          PT Education - 09/21/20 1656     Education Details therex form/technique    Person(s) Educated Patient    Methods Explanation;Demonstration;Verbal cues    Comprehension Verbalized understanding;Returned demonstration;Verbal  cues required                 PT Long Term Goals - 08/05/20 1740       PT LONG TERM GOAL #1   Title Patient will be independent with balance HEP to continue benefits of therapy after discharge.    Baseline dependent with form/technique for balance exercise; 03/25/2018: Independent     Time 6    Period Weeks    Status Achieved      PT LONG TERM GOAL #2   Title Patient will improve FGA by 4 points to indicate significant improvement with balancing while ambulating and decrease of fall risk    Baseline 03/25/2018: 28/30; 04/10/2019: 22; 07/21/2019: 29; 09/17/2019: 30/30    Time 6    Period Weeks    Status Achieved      PT LONG TERM GOAL #3   Title Patient will be able to perform prolonged single leg stance >10sec with eyes closed to indicate improvement with static balance and decrease fall risk    Baseline 2 sec in standing EC;  03/25/2018: 3 sec; 04/29/2018: 4 sec; 08/21/2018: 5 sec; 09/19/2018: 6 sec ; 10/21/2018 3 sec; 11/21/2018 5 sec; 01/08/2019: 9 sec; 02/25/2019: 14sec    Time 4    Period Weeks    Status Achieved      PT LONG TERM GOAL #4   Title Pt will improve 6 MWT >2084ft to demonstrate improved cardiorespiratory fitness    Baseline Deferred to next session: 04/29/2018; 05/01/2018: 144ft; 08/21/2018: 1465ft; 09/19/2018: 1439ft; 10/21/2018 1621; 11/21/2018 deferred to next session 2/2 time.; 01/08/2019: 1480 ft; 02/25/2019: 1334ft; 04/10/2019: 1400 ft, 05/13/19: 1321 ft; 07/21/2019: 1170 ft; 09/17/2019: 1242ft; 11/17/2019: 1239ft; 01/13/20: 2947; 03/15/20: 1223ft; 06/17/2020: 1262ft    Time 4    Period Weeks    Status Deferred      PT LONG TERM GOAL #5   Title Patient will be able to run 278ft without stopping to better be able to return to recreational activites and address cardiovascular endurance.    Baseline 54ft; 02/25/2019: 4ft; 04/10/2019 deferred    Time 4    Period Weeks    Status Deferred      PT LONG TERM GOAL #6   Title Patient will improve L hip flexion strength to 4/5 for improved steppage gait and reduced fall risk.    Baseline 04/10/2019: L hip flexion 3+/5; 07/21/2019: L Hip 4/5    Time 6    Period Weeks    Status Achieved      PT LONG TERM GOAL #7   Title Patient will be able to lift her affected L LE to the same extent as the R LE to allow for greater amount of foot clearance when ambulating    Baseline L LE: 10", R LE: 15"; 11/17/2019: L LE: 14", R LE 17.5"; 01/13/2020: L LE: 15.5", R LE 18"; 12/20: 18" B    Time 8    Period Weeks    Status Achieved      PT LONG TERM GOAL #8   Title Patient will have full dorsiflexion with 5/5 strength to allow for greater toe clearance with the performance of exercises, most notably when walking.    Baseline L 4/5 5 degree ; R 5/5 12 degree; L 4+/5 5 degree ; R 5/5 12 degree    Time 8    Period Weeks    Status Achieved      PT LONG TERM GOAL  #9  TITLE  Patient will have no scuffing along L LE with performance of 6 min WT to indicate    Baseline 06/17/2020: No scuffing during today    Time 6    Period Weeks    Status Achieved      PT LONG TERM GOAL  #10   TITLE Patient will improve her ABC by 13% to indicate significant improvement in confidence with her balance    Baseline 56.25%; 08/05/20 86.2%    Time 6    Period Weeks    Status Achieved      PT LONG TERM GOAL  #11   TITLE Patient will improve FGA by 3 points to indicate significant improvement in fucntioncal gait and balance with walking.    Baseline 08/05/20 22/30    Time 6    Period Weeks    Status On-going                   Plan - 09/21/20 1730     Clinical Impression Statement PT continued therex progression for general UE and LE strengthening, with dynamic balance/coordination progression with success. Patient is able to comply with all cuing for proper technique of therex with good motivation throughout session. Pt requires some degree of gaurding throughout session for safety. PT will continue progression as able.    Personal Factors and Comorbidities Past/Current Experience    Examination-Activity Limitations Locomotion Level    Examination-Participation Restrictions Community Activity    Stability/Clinical Decision Making Stable/Uncomplicated    Clinical Decision Making Moderate    Rehab Potential Good    Clinical Impairments Affecting Rehab Potential (-) hx of MS; (+) good motivation    PT Frequency 2x / week    PT Duration 12 weeks    PT Treatment/Interventions Moist Heat;Iontophoresis 4mg /ml Dexamethasone;Electrical Stimulation;Aquatic Therapy;Cryotherapy;Therapeutic activities;Neuromuscular re-education;Therapeutic exercise;Patient/family education;Dry needling;Manual techniques;Passive range of motion;Balance training;Functional mobility training;Gait training;Stair training;Joint Manipulations;Prosthetic Training    PT Next Visit Plan Dynamic balance and  LE strength exercise    PT Home Exercise Plan hip flexion in sitting for strengthening, hip flexion knee flexion in standing for balance, bike intervals at 70%-75% max HR    Consulted and Agree with Plan of Care Patient             Patient will benefit from skilled therapeutic intervention in order to improve the following deficits and impairments:  Pain, Decreased coordination, Impaired perceived functional ability, Increased fascial restricitons, Increased muscle spasms, Difficulty walking, Abnormal gait, Decreased balance, Decreased mobility, Impaired sensation, Increased edema  Visit Diagnosis: Difficulty in walking, not elsewhere classified  Muscle weakness (generalized)     Problem List There are no problems to display for this patient.  Durwin Reges DPT Durwin Reges 09/21/2020, 5:36 PM  [facility] [address] [place], [address] Phone: [phone]   Fax:  [phone]  Name: ROSEALEE RECINOS MRN: [id number] Date of Birth: 02/21/1971

## 2020-09-28 ENCOUNTER — Ambulatory Visit: Payer: BC Managed Care – PPO | Admitting: Physical Therapy

## 2020-10-05 ENCOUNTER — Encounter: Payer: Self-pay | Admitting: Physical Therapy

## 2020-10-05 ENCOUNTER — Ambulatory Visit: Payer: BC Managed Care – PPO | Attending: Neurology | Admitting: Physical Therapy

## 2020-10-05 DIAGNOSIS — M6281 Muscle weakness (generalized): Secondary | ICD-10-CM | POA: Diagnosis present

## 2020-10-05 DIAGNOSIS — R262 Difficulty in walking, not elsewhere classified: Secondary | ICD-10-CM | POA: Diagnosis present

## 2020-10-05 NOTE — Therapy (Signed)
[facility] [address] [place], [address] Phone: [phone]   Fax:  [phone]  Physical Therapy Treatment  Patient Details  Name: Michelle Ruiz MRN: [id number] Date of Birth: 01/02/1971 No data recorded  Encounter Date: 10/05/2020   PT End of Session - 10/05/20 1707     Visit Number 136    Number of Visits 200    Date for PT Re-Evaluation 12/14/20    Authorization Type 6/10    PT Start Time 1645    PT Stop Time 1724    PT Time Calculation (min) 39 min    Equipment Utilized During Treatment Gait belt    Activity Tolerance Patient tolerated treatment well;No increased pain    Behavior During Therapy [facility] for tasks assessed/performed;Anxious             Past Medical History:  Diagnosis Date   Abnormal Pap smear of cervix    Actinic keratosis    Anxiety    Basal cell carcinoma 04/14/2008   Right upper ant. thigh. BCC with sclerosis.   Basal cell carcinoma 08/14/2008   Right lat. lower thigh. Superficial.    Basal cell carcinoma 04/29/2019   Right mid forearm. Superficial and nodular patterns. EDC.   Basal cell carcinoma 08/17/2020   Right upper antecubital. EDC.   Kidney stones    MS (multiple sclerosis) ([place])    UTI (urinary tract infection)     Past Surgical History:  Procedure Laterality Date   BREAST BIOPSY Right 2010   stereotactic biopsy/ neg/ dr.byrnett   BREAST BIOPSY Left 2012   neg/dr. brynett   CESAREAN SECTION  2008   [facility]   TONSILLECTOMY  2006    There were no vitals filed for this visit.   Subjective Assessment - 10/05/20 1653     Subjective Pt missed last week d/t busy work schedule. Patient reports she is more fatigued walking outside late at night d/t humidity. Patient reports she stepped of a curb and tripped stepping off a curb d/t fatigue. Noticing fatigue in LLE with walks due to heat.    Patient is accompained by: Family member    Pertinent History Patient  reports history of falls x3 in the past 6 months secondary to drop foot on the L LE. PMH: Multiple sclerosis; elbow fracture of the R UE    Limitations Standing;Walking;Lifting    How long can you stand comfortably? 20 min    How long can you walk comfortably? 1 miles    Patient Stated Goals To be more balanced and core strengthening     Pain Onset More than a month ago             TREATMENT Therapeutic Exercise Hip abduction in standing - swings - x 12 each  Hip extension in standing - swings - x 12 each Standing on foam weighted ball (6.5#) toss/catch (toss with trunk rotation) 2x 12 each direction; cuing for trunk rotation  Squat with figure 8 chop (4.5# ball in pillow case) 2x 12 min cuing for technique with demo Alt step down from 6in step 2x 12 with unilateral finger support with cuing for "light touch" with good carry over (mimicking stepping off curb) Alt step ups onto 8in step with pink tbal overhead press 2x 12 Sled push (with chair with 60# on chair) 4x 62ft with rest following 2 sets  PT Education - 10/05/20 1706     Education Details therex form/technique    Person(s) Educated Patient    Methods Explanation;Demonstration;Verbal cues    Comprehension Verbalized understanding;Returned demonstration;Verbal cues required                 PT Long Term Goals - 08/05/20 1740       PT LONG TERM GOAL #1   Title Patient will be independent with balance HEP to continue benefits of therapy after discharge.    Baseline dependent with form/technique for balance exercise; 03/25/2018: Independent     Time 6    Period Weeks    Status Achieved      PT LONG TERM GOAL #2   Title Patient will improve FGA by 4 points to indicate significant improvement with balancing while ambulating and decrease of fall risk    Baseline 03/25/2018: 28/30; 04/10/2019: 22; 07/21/2019: 29; 09/17/2019: 30/30    Time 6    Period Weeks    Status  Achieved      PT LONG TERM GOAL #3   Title Patient will be able to perform prolonged single leg stance >10sec with eyes closed to indicate improvement with static balance and decrease fall risk    Baseline 2 sec in standing EC; 03/25/2018: 3 sec; 04/29/2018: 4 sec; 08/21/2018: 5 sec; 09/19/2018: 6 sec ; 10/21/2018 3 sec; 11/21/2018 5 sec; 01/08/2019: 9 sec; 02/25/2019: 14sec    Time 4    Period Weeks    Status Achieved      PT LONG TERM GOAL #4   Title Pt will improve 6 MWT >2059ft to demonstrate improved cardiorespiratory fitness    Baseline Deferred to next session: 04/29/2018; 05/01/2018: 1452ft; 08/21/2018: 146ft; 09/19/2018: 1417ft; 10/21/2018 1621; 11/21/2018 deferred to next session 2/2 time.; 01/08/2019: 1480 ft; 02/25/2019: 1332ft; 04/10/2019: 1400 ft, 05/13/19: 1321 ft; 07/21/2019: 1170 ft; 09/17/2019: 1211ft; 11/17/2019: 1273ft; 01/13/20: 3810; 03/15/20: 1278ft; 06/17/2020: 1242ft    Time 4    Period Weeks    Status Deferred      PT LONG TERM GOAL #5   Title Patient will be able to run 270ft without stopping to better be able to return to recreational activites and address cardiovascular endurance.    Baseline 17ft; 02/25/2019: 32ft; 04/10/2019 deferred    Time 4    Period Weeks    Status Deferred      PT LONG TERM GOAL #6   Title Patient will improve L hip flexion strength to 4/5 for improved steppage gait and reduced fall risk.    Baseline 04/10/2019: L hip flexion 3+/5; 07/21/2019: L Hip 4/5    Time 6    Period Weeks    Status Achieved      PT LONG TERM GOAL #7   Title Patient will be able to lift her affected L LE to the same extent as the R LE to allow for greater amount of foot clearance when ambulating    Baseline L LE: 10", R LE: 15"; 11/17/2019: L LE: 14", R LE 17.5"; 01/13/2020: L LE: 15.5", R LE 18"; 12/20: 18" B    Time 8    Period Weeks    Status Achieved      PT LONG TERM GOAL #8   Title Patient will have full dorsiflexion with 5/5 strength to allow for greater toe clearance  with the performance of exercises, most notably when walking.    Baseline L 4/5 5 degree ; R 5/5 12 degree; L 4+/5 5 degree ;  R 5/5 12 degree    Time 8    Period Weeks    Status Achieved      PT LONG TERM GOAL  #9   TITLE Patient will have no scuffing along L LE with performance of 6 min WT to indicate    Baseline 06/17/2020: No scuffing during today    Time 6    Period Weeks    Status Achieved      PT LONG TERM GOAL  #10   TITLE Patient will improve her ABC by 13% to indicate significant improvement in confidence with her balance    Baseline 56.25%; 08/05/20 86.2%    Time 6    Period Weeks    Status Achieved      PT LONG TERM GOAL  #11   TITLE Patient will improve FGA by 3 points to indicate significant improvement in fucntioncal gait and balance with walking.    Baseline 08/05/20 22/30    Time 6    Period Weeks    Status On-going                   Plan - 10/06/20 0820     Clinical Impression Statement PT continued therex progression for combination therex with UE and LE strengthening, and dynamic balance with success. PT continues to progress standing edurance demand on patient with continued effort, seated rest breaks needed as appropriate. Pt is able to comply with all cuing for proper technique of therex with good motivation throughout session. Pt with difficulty with LLE stance > RLE during step down exercise, and reports her LLE fatigues more quickly during ambulation. PT will continue progression as able.    Personal Factors and Comorbidities Past/Current Experience    Examination-Activity Limitations Locomotion Level    Examination-Participation Restrictions Community Activity    Stability/Clinical Decision Making Stable/Uncomplicated    Clinical Decision Making Moderate    Rehab Potential Good    Clinical Impairments Affecting Rehab Potential (-) hx of MS; (+) good motivation    PT Frequency 2x / week    PT Duration 12 weeks    PT Treatment/Interventions Moist  Heat;Iontophoresis 4mg /ml Dexamethasone;Electrical Stimulation;Aquatic Therapy;Cryotherapy;Therapeutic activities;Neuromuscular re-education;Therapeutic exercise;Patient/family education;Dry needling;Manual techniques;Passive range of motion;Balance training;Functional mobility training;Gait training;Stair training;Joint Manipulations;Prosthetic Training    PT Next Visit Plan Dynamic balance and LE strength exercise    PT Home Exercise Plan hip flexion in sitting for strengthening, hip flexion knee flexion in standing for balance, bike intervals at 70%-75% max HR    Consulted and Agree with Plan of Care Patient             Patient will benefit from skilled therapeutic intervention in order to improve the following deficits and impairments:  Pain, Decreased coordination, Impaired perceived functional ability, Increased fascial restricitons, Increased muscle spasms, Difficulty walking, Abnormal gait, Decreased balance, Decreased mobility, Impaired sensation, Increased edema  Visit Diagnosis: Difficulty in walking, not elsewhere classified  Muscle weakness (generalized)     Problem List There are no problems to display for this patient.  Durwin Reges DPT Durwin Reges 10/06/2020, 8:24 AM  [facility] [address] [place], [address] Phone: [phone]   Fax:  [phone]  Name: LYNZEE LINDQUIST MRN: [id number] Date of Birth: Mar 23, 1971

## 2020-10-12 ENCOUNTER — Encounter: Payer: Self-pay | Admitting: Physical Therapy

## 2020-10-12 ENCOUNTER — Ambulatory Visit: Payer: BC Managed Care – PPO | Admitting: Physical Therapy

## 2020-10-12 DIAGNOSIS — M6281 Muscle weakness (generalized): Secondary | ICD-10-CM

## 2020-10-12 DIAGNOSIS — R262 Difficulty in walking, not elsewhere classified: Secondary | ICD-10-CM | POA: Diagnosis not present

## 2020-10-12 NOTE — Therapy (Signed)
[place] [facility] [address] [place], [address] Phone: [phone]   Fax:  [phone]  Physical Therapy Treatment  Patient Details  Name: Michelle Ruiz MRN: [id number] Date of Birth: February 26, 1971 No data recorded  Encounter Date: 10/12/2020   PT End of Session - 10/12/20 1701     Visit Number 137    Number of Visits 200    Date for PT Re-Evaluation 12/14/20    Authorization Type 7/10    PT Start Time 1645    PT Stop Time 1725    PT Time Calculation (min) 40 min    Equipment Utilized During Treatment Gait belt    Activity Tolerance Patient tolerated treatment well;No increased pain    Behavior During Therapy [facility] for tasks assessed/performed;Anxious             Past Medical History:  Diagnosis Date   Abnormal Pap smear of cervix    Actinic keratosis    Anxiety    Basal cell carcinoma 04/14/2008   Right upper ant. thigh. BCC with sclerosis.   Basal cell carcinoma 08/14/2008   Right lat. lower thigh. Superficial.    Basal cell carcinoma 04/29/2019   Right mid forearm. Superficial and nodular patterns. EDC.   Basal cell carcinoma 08/17/2020   Right upper antecubital. EDC.   Kidney stones    MS (multiple sclerosis) ([place])    UTI (urinary tract infection)     Past Surgical History:  Procedure Laterality Date   BREAST BIOPSY Right 2010   stereotactic biopsy/ neg/ dr.byrnett   BREAST BIOPSY Left 2012   neg/dr. brynett   CESAREAN SECTION  2008   [facility]   TONSILLECTOMY  2006    There were no vitals filed for this visit.   Subjective Assessment - 10/12/20 1654     Subjective Pt reports she had some L knee pain following last session, that subsided after a day. Nothing new to report. Completing HEP.    Patient is accompained by: Family member    Pertinent History Patient reports history of falls x3 in the past 6 months secondary to drop foot on the L LE. PMH: Multiple sclerosis; elbow fracture of the  R UE    Limitations Standing;Walking;Lifting    How long can you stand comfortably? 20 min    How long can you walk comfortably? 1 miles    Patient Stated Goals To be more balanced and core strengthening     Pain Onset More than a month ago             TREATMENT Therapeutic Exercise Hip abduction in standing - swings - x 12 each  Hip extension in standing - swings - x 12 each Unilateral farmers carry 20# with cuing for posture without compensation with good carry over Squat with figure 8 20# KB between the knees 2x 30sec with cuing/demo needed for squat mechanics with good carry over BluTB row in squat 2x 12 with excellent carry over of initial demo and cuing for squat mechanics (over chair hover) and scapular retraction  Alt step down from 6in step 2x 12 with unilateral finger support with cuing for "light touch" with good carry over (mimicking stepping off curb) Bear crawl 12ft with 7.5# AW on back x2, cuing to prevent hip rotation with increased core contraction with decent carry over  PT Education - 10/12/20 1701     Education Details therex form/technique    Person(s) Educated Patient    Methods Explanation;Demonstration;Verbal cues    Comprehension Verbalized understanding;Returned demonstration;Verbal cues required                 PT Long Term Goals - 08/05/20 1740       PT LONG TERM GOAL #1   Title Patient will be independent with balance HEP to continue benefits of therapy after discharge.    Baseline dependent with form/technique for balance exercise; 03/25/2018: Independent     Time 6    Period Weeks    Status Achieved      PT LONG TERM GOAL #2   Title Patient will improve FGA by 4 points to indicate significant improvement with balancing while ambulating and decrease of fall risk    Baseline 03/25/2018: 28/30; 04/10/2019: 22; 07/21/2019: 29; 09/17/2019: 30/30    Time 6    Period Weeks    Status Achieved       PT LONG TERM GOAL #3   Title Patient will be able to perform prolonged single leg stance >10sec with eyes closed to indicate improvement with static balance and decrease fall risk    Baseline 2 sec in standing EC; 03/25/2018: 3 sec; 04/29/2018: 4 sec; 08/21/2018: 5 sec; 09/19/2018: 6 sec ; 10/21/2018 3 sec; 11/21/2018 5 sec; 01/08/2019: 9 sec; 02/25/2019: 14sec    Time 4    Period Weeks    Status Achieved      PT LONG TERM GOAL #4   Title Pt will improve 6 MWT >205ft to demonstrate improved cardiorespiratory fitness    Baseline Deferred to next session: 04/29/2018; 05/01/2018: 1456ft; 08/21/2018: 141ft; 09/19/2018: 1475ft; 10/21/2018 1621; 11/21/2018 deferred to next session 2/2 time.; 01/08/2019: 1480 ft; 02/25/2019: 1374ft; 04/10/2019: 1400 ft, 05/13/19: 1321 ft; 07/21/2019: 1170 ft; 09/17/2019: 1242ft; 11/17/2019: 1215ft; 01/13/20: 8546; 03/15/20: 1259ft; 06/17/2020: 1245ft    Time 4    Period Weeks    Status Deferred      PT LONG TERM GOAL #5   Title Patient will be able to run 29ft without stopping to better be able to return to recreational activites and address cardiovascular endurance.    Baseline 18ft; 02/25/2019: 19ft; 04/10/2019 deferred    Time 4    Period Weeks    Status Deferred      PT LONG TERM GOAL #6   Title Patient will improve L hip flexion strength to 4/5 for improved steppage gait and reduced fall risk.    Baseline 04/10/2019: L hip flexion 3+/5; 07/21/2019: L Hip 4/5    Time 6    Period Weeks    Status Achieved      PT LONG TERM GOAL #7   Title Patient will be able to lift her affected L LE to the same extent as the R LE to allow for greater amount of foot clearance when ambulating    Baseline L LE: 10", R LE: 15"; 11/17/2019: L LE: 14", R LE 17.5"; 01/13/2020: L LE: 15.5", R LE 18"; 12/20: 18" B    Time 8    Period Weeks    Status Achieved      PT LONG TERM GOAL #8   Title Patient will have full dorsiflexion with 5/5 strength to allow for greater toe clearance with the  performance of exercises, most notably when walking.    Baseline L 4/5 5 degree ; R 5/5 12 degree; L 4+/5 5 degree ;  R 5/5 12 degree    Time 8    Period Weeks    Status Achieved      PT LONG TERM GOAL  #9   TITLE Patient will have no scuffing along L LE with performance of 6 min WT to indicate    Baseline 06/17/2020: No scuffing during today    Time 6    Period Weeks    Status Achieved      PT LONG TERM GOAL  #10   TITLE Patient will improve her ABC by 13% to indicate significant improvement in confidence with her balance    Baseline 56.25%; 08/05/20 86.2%    Time 6    Period Weeks    Status Achieved      PT LONG TERM GOAL  #11   TITLE Patient will improve FGA by 3 points to indicate significant improvement in fucntioncal gait and balance with walking.    Baseline 08/05/20 22/30    Time 6    Period Weeks    Status On-going                   Plan - 10/13/20 1244     Clinical Impression Statement PT continued therex progression with combined movements, and dynamic balance demand with success. Patient is able to comply with all cuing for proper techniqu eof therex, with expected fatigue at end of therex and ens of session; rest breaks following each set. PT will continue progression as able.    Personal Factors and Comorbidities Past/Current Experience    Examination-Activity Limitations Locomotion Level    Examination-Participation Restrictions Community Activity    Stability/Clinical Decision Making Stable/Uncomplicated    Clinical Decision Making Moderate    Rehab Potential Good    Clinical Impairments Affecting Rehab Potential (-) hx of MS; (+) good motivation    PT Frequency 2x / week    PT Duration 12 weeks    PT Treatment/Interventions Moist Heat;Iontophoresis 4mg /ml Dexamethasone;Electrical Stimulation;Aquatic Therapy;Cryotherapy;Therapeutic activities;Neuromuscular re-education;Therapeutic exercise;Patient/family education;Dry needling;Manual techniques;Passive  range of motion;Balance training;Functional mobility training;Gait training;Stair training;Joint Manipulations;Prosthetic Training    PT Next Visit Plan Dynamic balance and LE strength exercise    PT Home Exercise Plan hip flexion in sitting for strengthening, hip flexion knee flexion in standing for balance, bike intervals at 70%-75% max HR    Consulted and Agree with Plan of Care Patient             Patient will benefit from skilled therapeutic intervention in order to improve the following deficits and impairments:  Pain, Decreased coordination, Impaired perceived functional ability, Increased fascial restricitons, Increased muscle spasms, Difficulty walking, Abnormal gait, Decreased balance, Decreased mobility, Impaired sensation, Increased edema  Visit Diagnosis: Difficulty in walking, not elsewhere classified  Muscle weakness (generalized)     Problem List There are no problems to display for this patient.  Durwin Reges DPT Durwin Reges 10/13/2020, 12:53 PM  [place] [facility] [address], [place], [address] Phone: [phone]   Fax:  [phone]  Name: Michelle Ruiz MRN: [id number] Date of Birth: 31-Jul-1970

## 2020-10-13 ENCOUNTER — Ambulatory Visit
Admission: RE | Admit: 2020-10-13 | Discharge: 2020-10-13 | Disposition: A | Discharge status: Home / Self Care | Payer: BC Managed Care – PPO | Source: Ambulatory Visit | Attending: Neurology | Admitting: Neurology

## 2020-10-13 ENCOUNTER — Other Ambulatory Visit: Payer: Self-pay

## 2020-10-13 DIAGNOSIS — G35 Multiple sclerosis: Secondary | ICD-10-CM | POA: Insufficient documentation

## 2020-10-13 MED ORDER — GADOBUTROL 1 MMOL/ML IV SOLN
7.5000 mL | Freq: Once | INTRAVENOUS | Status: AC | PRN
  Administered 2020-10-13: 7.5 mL via INTRAVENOUS

## 2020-10-19 ENCOUNTER — Ambulatory Visit: Payer: BC Managed Care – PPO | Admitting: Physical Therapy

## 2020-10-26 ENCOUNTER — Encounter: Payer: Self-pay | Admitting: Physical Therapy

## 2020-10-26 ENCOUNTER — Ambulatory Visit: Payer: BC Managed Care – PPO | Attending: Neurology | Admitting: Physical Therapy

## 2020-10-26 DIAGNOSIS — Z9181 History of falling: Secondary | ICD-10-CM | POA: Insufficient documentation

## 2020-10-26 DIAGNOSIS — M6281 Muscle weakness (generalized): Secondary | ICD-10-CM | POA: Insufficient documentation

## 2020-10-26 DIAGNOSIS — R262 Difficulty in walking, not elsewhere classified: Secondary | ICD-10-CM | POA: Diagnosis present

## 2020-10-26 NOTE — Therapy (Signed)
[place] [facility] [address] [place], [address] Phone: [phone]   Fax:  [phone]  Physical Therapy Treatment  Patient Details  Name: Michelle Ruiz MRN: [id number] Date of Birth: February 04, 1971 No data recorded  Encounter Date: 10/26/2020   PT End of Session - 10/26/20 1718     Visit Number 138    Number of Visits 200    Date for PT Re-Evaluation 12/14/20    Authorization Type 8/10    PT Start Time 1645    PT Stop Time 1725    PT Time Calculation (min) 40 min    Equipment Utilized During Treatment Gait belt    Activity Tolerance Patient tolerated treatment well;No increased pain    Behavior During Therapy [facility] for tasks assessed/performed;Anxious             Past Medical History:  Diagnosis Date   Abnormal Pap smear of cervix    Actinic keratosis    Anxiety    Basal cell carcinoma 04/14/2008   Right upper ant. thigh. BCC with sclerosis.   Basal cell carcinoma 08/14/2008   Right lat. lower thigh. Superficial.    Basal cell carcinoma 04/29/2019   Right mid forearm. Superficial and nodular patterns. EDC.   Basal cell carcinoma 08/17/2020   Right upper antecubital. EDC.   Kidney stones    MS (multiple sclerosis) ([place])    UTI (urinary tract infection)     Past Surgical History:  Procedure Laterality Date   BREAST BIOPSY Right 2010   stereotactic biopsy/ neg/ dr.byrnett   BREAST BIOPSY Left 2012   neg/dr. brynett   CESAREAN SECTION  2008   [facility]   TONSILLECTOMY  2006    There were no vitals filed for this visit.   Subjective Assessment - 10/26/20 1656     Subjective Patient reports no falls since last visit. Completing HEP, having difficult time with heat.    Patient is accompained by: Family member    Pertinent History Patient reports history of falls x3 in the past 6 months secondary to drop foot on the L LE. PMH: Multiple sclerosis; elbow fracture of the R UE    Limitations  Standing;Walking;Lifting    How long can you stand comfortably? 20 min    How long can you walk comfortably? 1 miles    Patient Stated Goals To be more balanced and core strengthening     Pain Onset More than a month ago             TREATMENT Therapeutic Exercise Hip abduction in standing - swings - x 12 each  Hip extension in standing - swings - x 12 each Unilateral overhead farmers carry 7# with cuing for posture without compensation with good carry over Squat with figure 8 20# KB between the knees 2x 30sec with cuing/demo needed for squat mechanics with good carry over Lateral lunge onto bosu ball hardside 2x 6 each LE with supervision for safety  Alt step down from 6in step 2x 20with unilateral finger support with cuing for "light touch" with good carry over (mimicking stepping off curb) Bear crawl 22f with 7.5# AW on back x2, cuing to prevent hip rotation with increased core contraction with decent carry over                          PT Education - 10/26/20 1714     Education Details therex form/technique  Person(s) Educated Patient    Methods Explanation;Demonstration;Verbal cues    Comprehension Verbalized understanding;Returned demonstration;Verbal cues required                 PT Long Term Goals - 08/05/20 1740       PT LONG TERM GOAL #1   Title Patient will be independent with balance HEP to continue benefits of therapy after discharge.    Baseline dependent with form/technique for balance exercise; 03/25/2018: Independent     Time 6    Period Weeks    Status Achieved      PT LONG TERM GOAL #2   Title Patient will improve FGA by 4 points to indicate significant improvement with balancing while ambulating and decrease of fall risk    Baseline 03/25/2018: 28/30; 04/10/2019: 22; 07/21/2019: 29; 09/17/2019: 30/30    Time 6    Period Weeks    Status Achieved      PT LONG TERM GOAL #3   Title Patient will be able to perform prolonged  single leg stance >10sec with eyes closed to indicate improvement with static balance and decrease fall risk    Baseline 2 sec in standing EC; 03/25/2018: 3 sec; 04/29/2018: 4 sec; 08/21/2018: 5 sec; 09/19/2018: 6 sec ; 10/21/2018 3 sec; 11/21/2018 5 sec; 01/08/2019: 9 sec; 02/25/2019: 14sec    Time 4    Period Weeks    Status Achieved      PT LONG TERM GOAL #4   Title Pt will improve 6 MWT >2037f to demonstrate improved cardiorespiratory fitness    Baseline Deferred to next session: 04/29/2018; 05/01/2018: 14559f 08/21/2018: 140066f6/25/2020: 1435f21f/27/2020 1621; 11/21/2018 deferred to next session 2/2 time.; 01/08/2019: 1480 ft; 02/25/2019: 1300ft76f14/2021: 1400 ft, 05/13/19: 1321 ft; 07/21/2019: 1170 ft; 09/17/2019: 1260ft;57f3/2021: 1250ft; 55f9/21: 1353; [id number]/21: 1275ft; 367f2022: 1210ft    52f 4    Period Weeks    Status Deferred      PT LONG TERM GOAL #5   Title Patient will be able to run 200ft with57fstopping to better be able to return to recreational activites and address cardiovascular endurance.    Baseline 33ft; 12/021f20: 90ft; 1/14/29f deferred    Time 4    Period Weeks    Status Deferred      PT LONG TERM GOAL #6   Title Patient will improve L hip flexion strength to 4/5 for improved steppage gait and reduced fall risk.    Baseline 04/10/2019: L hip flexion 3+/5; 07/21/2019: L Hip 4/5    Time 6    Period Weeks    Status Achieved      PT LONG TERM GOAL #7   Title Patient will be able to lift her affected L LE to the same extent as the R LE to allow for greater amount of foot clearance when ambulating    Baseline L LE: 10", R LE: 15"; 11/17/2019: L LE: 14", R LE 17.5"; 01/13/2020: L LE: 15.5", R LE 18"; 12/20: 18" B    Time 8    Period Weeks    Status Achieved      PT LONG TERM GOAL #8   Title Patient will have full dorsiflexion with 5/5 strength to allow for greater toe clearance with the performance of exercises, most notably when walking.    Baseline L 4/5 5  degree ; R 5/5 12 degree; L 4+/5 5 degree ; R 5/5 12 degree    Time 8    Period Weeks  Status Achieved      PT LONG TERM GOAL  #9   TITLE Patient will have no scuffing along L LE with performance of 6 min WT to indicate    Baseline 06/17/2020: No scuffing during today    Time 6    Period Weeks    Status Achieved      PT LONG TERM GOAL  #10   TITLE Patient will improve her ABC by 13% to indicate significant improvement in confidence with her balance    Baseline 56.25%; 08/05/20 86.2%    Time 6    Period Weeks    Status Achieved      PT LONG TERM GOAL  #11   TITLE Patient will improve FGA by 3 points to indicate significant improvement in fucntioncal gait and balance with walking.    Baseline 08/05/20 22/30    Time 6    Period Weeks    Status On-going                   Plan - 10/27/20 1007     Clinical Impression Statement PT continued therex progression with combined tasks, with increased balance demand as well. Pt is able to comply with all cuing for proper technique of therex, with good motivation throughout session. Pt requires expected breaks with muscle fatigue, but no pain throughout session. Pt will continue to benefit from skilled PT services to [facility] and improve muscle endurance, strength, and power and increase safety with decreased fall risk. PT will continue progression as able.    Personal Factors and Comorbidities Past/Current Experience    Examination-Activity Limitations Locomotion Level    Examination-Participation Restrictions Community Activity    Stability/Clinical Decision Making Stable/Uncomplicated    Clinical Decision Making Moderate    Rehab Potential Good    Clinical Impairments Affecting Rehab Potential (-) hx of MS; (+) good motivation    PT Frequency 2x / week    PT Duration 12 weeks    PT Treatment/Interventions Moist Heat;Iontophoresis '4mg'$ /ml Dexamethasone;Electrical Stimulation;Aquatic Therapy;Cryotherapy;Therapeutic  activities;Neuromuscular re-education;Therapeutic exercise;Patient/family education;Dry needling;Manual techniques;Passive range of motion;Balance training;Functional mobility training;Gait training;Stair training;Joint Manipulations;Prosthetic Training    PT Next Visit Plan Dynamic balance and LE strength exercise    PT Home Exercise Plan hip flexion in sitting for strengthening, hip flexion knee flexion in standing for balance, bike intervals at 70%-75% max HR    Consulted and Agree with Plan of Care Patient             Patient will benefit from skilled therapeutic intervention in order to improve the following deficits and impairments:  Pain, Decreased coordination, Impaired perceived functional ability, Increased fascial restricitons, Increased muscle spasms, Difficulty walking, Abnormal gait, Decreased balance, Decreased mobility, Impaired sensation, Increased edema  Visit Diagnosis: Difficulty in walking, not elsewhere classified  Muscle weakness (generalized)     Problem List There are no problems to display for this patient.  Durwin Reges DPT Durwin Reges 10/27/2020, 10:12 AM  [place] [facility] [address] [place], [address] Phone: [phone]   Fax:  [phone]  Name: ZURIANA MCGURL MRN: [id number] Date of Birth: [birth year]

## 2020-11-02 ENCOUNTER — Ambulatory Visit: Payer: BC Managed Care – PPO

## 2020-11-02 DIAGNOSIS — M6281 Muscle weakness (generalized): Secondary | ICD-10-CM

## 2020-11-02 DIAGNOSIS — R262 Difficulty in walking, not elsewhere classified: Secondary | ICD-10-CM

## 2020-11-02 DIAGNOSIS — Z9181 History of falling: Secondary | ICD-10-CM

## 2020-11-02 NOTE — Therapy (Signed)
[place] [facility] [address] [place], [address] Phone: [phone]   Fax:  [phone]  Physical Therapy Treatment  Patient Details  Name: Michelle Ruiz MRN: [id number] Date of Birth: [birth year] No data recorded  Encounter Date: 11/02/2020   PT End of Session - 11/02/20 1732     Visit Number 139    Number of Visits 200    Date for PT Re-Evaluation 12/14/20    Authorization Type 8/10    PT Start Time 1645    PT Stop Time 1728    PT Time Calculation (min) 43 min    Equipment Utilized During Treatment Gait belt    Activity Tolerance Patient tolerated treatment well;No increased pain    Behavior During Therapy [facility] for tasks assessed/performed;Anxious             Past Medical History:  Diagnosis Date   Abnormal Pap smear of cervix    Actinic keratosis    Anxiety    Basal cell carcinoma 04/14/2008   Right upper ant. thigh. BCC with sclerosis.   Basal cell carcinoma 08/14/2008   Right lat. lower thigh. Superficial.    Basal cell carcinoma 04/29/2019   Right mid forearm. Superficial and nodular patterns. EDC.   Basal cell carcinoma 08/17/2020   Right upper antecubital. EDC.   Kidney stones    MS (multiple sclerosis) ([place])    UTI (urinary tract infection)     Past Surgical History:  Procedure Laterality Date   BREAST BIOPSY Right 2010   stereotactic biopsy/ neg/ dr.byrnett   BREAST BIOPSY Left 2012   neg/dr. brynett   CESAREAN SECTION  2008   [facility]   TONSILLECTOMY  2006    There were no vitals filed for this visit.   Subjective Assessment - 11/02/20 1648     Subjective Pt reports R arm has been irritable lately. Still having difficulty with the heat.    Patient is accompained by: Family member    Pertinent History Patient reports history of falls x3 in the past 6 months secondary to drop foot on the L LE. PMH: Multiple sclerosis; elbow fracture of the R UE    Limitations  Standing;Walking;Lifting    How long can you stand comfortably? 20 min    How long can you walk comfortably? 1 miles    Patient Stated Goals To be more balanced and core strengthening     Currently in Pain? No/denies    Pain Score 0-No pain    Pain Onset More than a month ago            There.ex:   Hip abduction in standing - swings - x 12 each  Hip extension in standing - swings - x 12 each Squat with 20# KB figure 8: 2x6. Mod VC's for form.  [place] split squat with LE resting on standard height chair: 2x5, intermittent UE support. CGA Unilateral overhead farmer carries with 7# DB- 1x100'/UE. CGA for balance   6" step up exercise (CGA for safety):   Lateral 2x8  Forward modified running man 2x8 GTB monster walks lateral walks. 2x10'  Adequate rest provided throughout session.   PT Long Term Goals - 08/05/20 1740       PT LONG TERM GOAL #1   Title Patient will be independent with balance HEP to continue benefits of therapy after discharge.    Baseline dependent with form/technique for balance exercise; 03/25/2018: Independent     Time 6  Period Weeks    Status Achieved      PT LONG TERM GOAL #2   Title Patient will improve FGA by 4 points to indicate significant improvement with balancing while ambulating and decrease of fall risk    Baseline 03/25/2018: 28/30; 04/10/2019: 22; 07/21/2019: 29; 09/17/2019: 30/30    Time 6    Period Weeks    Status Achieved      PT LONG TERM GOAL #3   Title Patient will be able to perform prolonged single leg stance >10sec with eyes closed to indicate improvement with static balance and decrease fall risk    Baseline 2 sec in standing EC; 03/25/2018: 3 sec; 04/29/2018: 4 sec; 08/21/2018: 5 sec; 09/19/2018: 6 sec ; 10/21/2018 3 sec; 11/21/2018 5 sec; 01/08/2019: 9 sec; 02/25/2019: 14sec    Time 4    Period Weeks    Status Achieved      PT LONG TERM GOAL #4   Title Pt will improve 6 MWT >2069f to demonstrate improved cardiorespiratory  fitness    Baseline Deferred to next session: 04/29/2018; 05/01/2018: 14566f 08/21/2018: 140021f6/25/2020: 1435f45f/27/2020 1621; 11/21/2018 deferred to next session 2/2 time.; 01/08/2019: 1480 ft; 02/25/2019: 1300ft59f14/2021: 1400 ft, 05/13/19: 1321 ft; 07/21/2019: 1170 ft; 09/17/2019: 1260ft;67f3/2021: 1250ft; 79f9/21: 1353; 1E2947910/21: 1275ft; 352f2022: 1210ft    50f 4    Period Weeks    Status Deferred      PT LONG TERM GOAL #5   Title Patient will be able to run 200ft with75fstopping to better be able to return to recreational activites and address cardiovascular endurance.    Baseline 33ft; 12/063f20: 90ft; 1/14/78f deferred    Time 4    Period Weeks    Status Deferred      PT LONG TERM GOAL #6   Title Patient will improve L hip flexion strength to 4/5 for improved steppage gait and reduced fall risk.    Baseline 04/10/2019: L hip flexion 3+/5; 07/21/2019: L Hip 4/5    Time 6    Period Weeks    Status Achieved      PT LONG TERM GOAL #7   Title Patient will be able to lift her affected L LE to the same extent as the R LE to allow for greater amount of foot clearance when ambulating    Baseline L LE: 10", R LE: 15"; 11/17/2019: L LE: 14", R LE 17.5"; 01/13/2020: L LE: 15.5", R LE 18"; 12/20: 18" B    Time 8    Period Weeks    Status Achieved      PT LONG TERM GOAL #8   Title Patient will have full dorsiflexion with 5/5 strength to allow for greater toe clearance with the performance of exercises, most notably when walking.    Baseline L 4/5 5 degree ; R 5/5 12 degree; L 4+/5 5 degree ; R 5/5 12 degree    Time 8    Period Weeks    Status Achieved      PT LONG TERM GOAL  #9   TITLE Patient will have no scuffing along L LE with performance of 6 min WT to indicate    Baseline 06/17/2020: No scuffing during today    Time 6    Period Weeks    Status Achieved      PT LONG TERM GOAL  #10   TITLE Patient will improve her ABC by 13% to indicate significant improvement in confidence  with her balance  Baseline 56.25%; 08/05/20 86.2%    Time 6    Period Weeks    Status Achieved      PT LONG TERM GOAL  #11   TITLE Patient will improve FGA by 3 points to indicate significant improvement in fucntioncal gait and balance with walking.    Baseline 08/05/20 22/30    Time 6    Period Weeks    Status On-going                   Plan - 11/02/20 1733     Clinical Impression Statement Continuing primary PT POC with global, high level balance and strength. Pt given adequate rest breaks throughout due to documentation and subjective reports pt having increased fatigue due to weather (MS diagnosis). Pt overall performs exercise with great form/technique requiring intermittent CGA for dynamic Le strength that required balance challenge with no LOB. Overall pt tolerated treatment well. Will continue PT to maintain and improve LE strength/endurance, balance and power to decreas risk of falls.    Personal Factors and Comorbidities Past/Current Experience    Examination-Activity Limitations Locomotion Level    Examination-Participation Restrictions Community Activity    Stability/Clinical Decision Making Stable/Uncomplicated    Rehab Potential Good    Clinical Impairments Affecting Rehab Potential (-) hx of MS; (+) good motivation    PT Frequency 2x / week    PT Duration 12 weeks    PT Treatment/Interventions Moist Heat;Iontophoresis '4mg'$ /ml Dexamethasone;Electrical Stimulation;Aquatic Therapy;Cryotherapy;Therapeutic activities;Neuromuscular re-education;Therapeutic exercise;Patient/family education;Dry needling;Manual techniques;Passive range of motion;Balance training;Functional mobility training;Gait training;Stair training;Joint Manipulations;Prosthetic Training    PT Next Visit Plan Dynamic balance and LE strength exercise    PT Home Exercise Plan hip flexion in sitting for strengthening, hip flexion knee flexion in standing for balance, bike intervals at 70%-75% max HR     Consulted and Agree with Plan of Care Patient             Patient will benefit from skilled therapeutic intervention in order to improve the following deficits and impairments:  Pain, Decreased coordination, Impaired perceived functional ability, Increased fascial restricitons, Increased muscle spasms, Difficulty walking, Abnormal gait, Decreased balance, Decreased mobility, Impaired sensation, Increased edema  Visit Diagnosis: Difficulty in walking, not elsewhere classified  Muscle weakness (generalized)  History of falling     Problem List There are no problems to display for this patient.   Salem Caster. Fairly IV, PT, DPT Physical Therapist- [facility]  11/02/2020, 5:38 PM  [place] [facility] [address], [place], [address] Phone: [phone]   Fax:  [phone]  Name: DETRA POLIZZI MRN: [id number] Date of Birth: [birth year]

## 2020-11-09 ENCOUNTER — Ambulatory Visit: Payer: BC Managed Care – PPO | Admitting: Physical Therapy

## 2020-11-09 ENCOUNTER — Encounter: Payer: Self-pay | Admitting: Physical Therapy

## 2020-11-09 DIAGNOSIS — R262 Difficulty in walking, not elsewhere classified: Secondary | ICD-10-CM | POA: Diagnosis not present

## 2020-11-09 NOTE — Therapy (Signed)
[place] [facility] [address], [place], [address] Phone: [phone]   Fax:  [phone]  Physical Therapy Treatment  Patient Details  Name: Michelle Ruiz MRN: [id number] Date of Birth: [birth year] No data recorded  Encounter Date: 11/09/2020   PT End of Session - 11/09/20 1726     Visit Number 140    Number of Visits 200    Date for PT Re-Evaluation 12/14/20    Authorization Type 9    Progress Note Due on Visit 10    PT Start Time [id number]    PT Stop Time 1723    PT Time Calculation (min) 40 min    Equipment Utilized During Treatment Gait belt    Activity Tolerance Patient tolerated treatment well;No increased pain    Behavior During Therapy [facility] for tasks assessed/performed;Anxious             Past Medical History:  Diagnosis Date   Abnormal Pap smear of cervix    Actinic keratosis    Anxiety    Basal cell carcinoma 04/14/2008   Right upper ant. thigh. BCC with sclerosis.   Basal cell carcinoma 08/14/2008   Right lat. lower thigh. Superficial.    Basal cell carcinoma 04/29/2019   Right mid forearm. Superficial and nodular patterns. EDC.   Basal cell carcinoma 08/17/2020   Right upper antecubital. EDC.   Kidney stones    MS (multiple sclerosis) ([place])    UTI (urinary tract infection)     Past Surgical History:  Procedure Laterality Date   BREAST BIOPSY Right 2010   stereotactic biopsy/ neg/ dr.byrnett   BREAST BIOPSY Left 2012   neg/dr. brynett   CESAREAN SECTION  2008   [facility]   TONSILLECTOMY  2006    There were no vitals filed for this visit.   Subjective Assessment - 11/09/20 1644     Subjective Pt reports feeling well today. She reports noticing less dorsiflexion on L ankle vs R ankle.    Pertinent History Patient reports history of falls x3 in the past 6 months secondary to drop foot on the L LE. PMH: Multiple sclerosis; elbow fracture of the R UE    Limitations Standing;Walking;Lifting     How long can you stand comfortably? 20 min    How long can you walk comfortably? 1 miles    Patient Stated Goals To be more balanced and core strengthening              Therapeutic exercise  Hip abduction in standing - swings - x 12 each   Hip extension in standing - swings - x 12 each  Squats on bosu ball 2 x 6 reps with CGA; two fingers assist on treadmill  Lateral lunge onto bosu ball softside 2x 6 each LE with supervision for safety   Unilateral overhead farmer carries with 7# DB- 1x100'/UE. CGA for balance    Kettle bell swings 20# 2 x 10 reps with emphasis on hip thrusts  Lateral Bear Crawls 1 x 10 ft L<>R     Rest breaks given between sets                    PT Education - 11/09/20 1733     Education Details therex form/technique    Person(s) Educated Patient    Methods Explanation;Demonstration    Comprehension Verbalized understanding;Returned demonstration  PT Long Term Goals - 08/05/20 1740       PT LONG TERM GOAL #1   Title Patient will be independent with balance HEP to continue benefits of therapy after discharge.    Baseline dependent with form/technique for balance exercise; 03/25/2018: Independent     Time 6    Period Weeks    Status Achieved      PT LONG TERM GOAL #2   Title Patient will improve FGA by 4 points to indicate significant improvement with balancing while ambulating and decrease of fall risk    Baseline 03/25/2018: 28/30; 04/10/2019: 22; 07/21/2019: 29; 09/17/2019: 30/30    Time 6    Period Weeks    Status Achieved      PT LONG TERM GOAL #3   Title Patient will be able to perform prolonged single leg stance >10sec with eyes closed to indicate improvement with static balance and decrease fall risk    Baseline 2 sec in standing EC; 03/25/2018: 3 sec; 04/29/2018: 4 sec; 08/21/2018: 5 sec; 09/19/2018: 6 sec ; 10/21/2018 3 sec; 11/21/2018 5 sec; 01/08/2019: 9 sec; 02/25/2019: 14sec    Time 4     Period Weeks    Status Achieved      PT LONG TERM GOAL #4   Title Pt will improve 6 MWT >2084f to demonstrate improved cardiorespiratory fitness    Baseline Deferred to next session: 04/29/2018; 05/01/2018: 14546f 08/21/2018: 140031f6/25/2020: 1435f34f/27/2020 1621; 11/21/2018 deferred to next session 2/2 time.; 01/08/2019: 1480 ft; 02/25/2019: 1300ft84f14/2021: 1400 ft, 05/13/19: 1321 ft; 07/21/2019: 1170 ft; 09/17/2019: 1260ft;78f3/2021: 1250ft; 36f9/21: 1353; 1E2947910/21: 1275ft; 333f2022: 1210ft    40f 4    Period Weeks    Status Deferred      PT LONG TERM GOAL #5   Title Patient will be able to run 200ft with9fstopping to better be able to return to recreational activites and address cardiovascular endurance.    Baseline 33ft; 12/060f20: 90ft; 1/14/45f deferred    Time 4    Period Weeks    Status Deferred      PT LONG TERM GOAL #6   Title Patient will improve L hip flexion strength to 4/5 for improved steppage gait and reduced fall risk.    Baseline 04/10/2019: L hip flexion 3+/5; 07/21/2019: L Hip 4/5    Time 6    Period Weeks    Status Achieved      PT LONG TERM GOAL #7   Title Patient will be able to lift her affected L LE to the same extent as the R LE to allow for greater amount of foot clearance when ambulating    Baseline L LE: 10", R LE: 15"; 11/17/2019: L LE: 14", R LE 17.5"; 01/13/2020: L LE: 15.5", R LE 18"; 12/20: 18" B    Time 8    Period Weeks    Status Achieved      PT LONG TERM GOAL #8   Title Patient will have full dorsiflexion with 5/5 strength to allow for greater toe clearance with the performance of exercises, most notably when walking.    Baseline L 4/5 5 degree ; R 5/5 12 degree; L 4+/5 5 degree ; R 5/5 12 degree    Time 8    Period Weeks    Status Achieved      PT LONG TERM GOAL  #9   TITLE Patient will have no scuffing along L LE with performance of 6 min WT to indicate  Baseline 06/17/2020: No scuffing during today    Time 6    Period Weeks     Status Achieved      PT LONG TERM GOAL  #10   TITLE Patient will improve her ABC by 13% to indicate significant improvement in confidence with her balance    Baseline 56.25%; 08/05/20 86.2%    Time 6    Period Weeks    Status Achieved      PT LONG TERM GOAL  #11   TITLE Patient will improve FGA by 3 points to indicate significant improvement in fucntioncal gait and balance with walking.    Baseline 08/05/20 22/30    Time 6    Period Weeks    Status On-going                   Plan - 11/09/20 1728     Clinical Impression Statement PT continued LE strengthening and dynamic balance progression with frequent rest breaks given between sets for adequate recovery. Pt continues to display decreased balance and decreased activity tolerance that requires supervision/CGA for occasional lose of balance. Pt demonstrated good form/technique with therex with occasional cueing for correction and increased strengthening effect.    Personal Factors and Comorbidities Past/Current Experience    Examination-Activity Limitations Locomotion Level    Examination-Participation Restrictions Community Activity    Stability/Clinical Decision Making Stable/Uncomplicated    Rehab Potential Good    Clinical Impairments Affecting Rehab Potential (-) hx of MS; (+) good motivation    PT Frequency 2x / week    PT Duration 12 weeks    PT Treatment/Interventions Moist Heat;Iontophoresis '4mg'$ /ml Dexamethasone;Electrical Stimulation;Aquatic Therapy;Cryotherapy;Therapeutic activities;Neuromuscular re-education;Therapeutic exercise;Patient/family education;Dry needling;Manual techniques;Passive range of motion;Balance training;Functional mobility training;Gait training;Stair training;Joint Manipulations;Prosthetic Training    PT Next Visit Plan Dynamic balance and LE strength exercise    PT Home Exercise Plan hip flexion in sitting for strengthening, hip flexion knee flexion in standing for balance, bike intervals at  70%-75% max HR             Patient will benefit from skilled therapeutic intervention in order to improve the following deficits and impairments:  Pain, Decreased coordination, Impaired perceived functional ability, Increased fascial restricitons, Increased muscle spasms, Difficulty walking, Abnormal gait, Decreased balance, Decreased mobility, Impaired sensation, Increased edema  Visit Diagnosis: Difficulty in walking, not elsewhere classified     Problem List There are no problems to display for this patient.   Durwin Reges DPT Sharion Settler, SPT  Durwin Reges 11/10/2020, 11:55 AM  [place] [facility] [address], [place], [address] Phone: [phone]   Fax:  [phone]  Name: JOLONDA ROWLING MRN: [id number] Date of Birth: [birth year]

## 2020-11-15 ENCOUNTER — Other Ambulatory Visit: Payer: Self-pay

## 2020-11-15 ENCOUNTER — Encounter: Payer: Self-pay | Admitting: Obstetrics and Gynecology

## 2020-11-15 ENCOUNTER — Ambulatory Visit (INDEPENDENT_AMBULATORY_CARE_PROVIDER_SITE_OTHER): Payer: BC Managed Care – PPO | Admitting: Obstetrics and Gynecology

## 2020-11-15 VITALS — BP 128/72 | Ht 69.0 in | Wt 185.4 lb

## 2020-11-15 DIAGNOSIS — Z1239 Encounter for other screening for malignant neoplasm of breast: Secondary | ICD-10-CM

## 2020-11-15 DIAGNOSIS — Z01411 Encounter for gynecological examination (general) (routine) with abnormal findings: Secondary | ICD-10-CM

## 2020-11-15 DIAGNOSIS — Z01419 Encounter for gynecological examination (general) (routine) without abnormal findings: Secondary | ICD-10-CM

## 2020-11-15 DIAGNOSIS — Z0000 Encounter for general adult medical examination without abnormal findings: Secondary | ICD-10-CM

## 2020-11-15 DIAGNOSIS — Z1231 Encounter for screening mammogram for malignant neoplasm of breast: Secondary | ICD-10-CM | POA: Diagnosis not present

## 2020-11-15 DIAGNOSIS — Z87891 Personal history of nicotine dependence: Secondary | ICD-10-CM

## 2020-11-15 DIAGNOSIS — R300 Dysuria: Secondary | ICD-10-CM

## 2020-11-15 DIAGNOSIS — N390 Urinary tract infection, site not specified: Secondary | ICD-10-CM

## 2020-11-15 MED ORDER — CIPROFLOXACIN HCL 500 MG PO TABS: 500.0000 mg | ORAL_TABLET | Freq: Every day | ORAL | 3 refills | Status: DC | PRN

## 2020-11-15 NOTE — Progress Notes (Signed)
Gynecology Annual Exam  PCP: Gladstone Lighter, MD  Chief Complaint:  Chief Complaint  Patient presents with   Gynecologic Exam    History of Present Illness: Patient is a 50 y.o. G2P1011 presents for annual exam. The patient has no complaints today.   LMP: Patient's last menstrual period was 10/21/2020. Average Interval: regular, 25 days Duration of flow: 7 days  Heavy Menses: 2 days of heavy bleeding, changes a tampon every 3 hours.  Dysmenorrhea: no  She denies passage of large clots She denies sensations of gushing or flooding of blood. She denies accidents where she bleeds through her clothing. She denies that she changes a saturated pad or tampon more frequently than every hour.  She denies that pain from her periods limits her activities.  The patient does perform self breast exams.  There is no notable family history of breast or ovarian cancer in her family.  The patient has regular exercise: walking 6-7 days a week  The patient denies current symptoms of depression.   PHQ-9: 1 GAD-7: 1   Review of Systems: Review of Systems  Constitutional:  Negative for chills, fever, malaise/fatigue and weight loss.  HENT:  Negative for congestion, hearing loss and sinus pain.   Eyes:  Negative for blurred vision and double vision.  Respiratory:  Negative for cough, sputum production, shortness of breath and wheezing.   Cardiovascular:  Negative for chest pain, palpitations, orthopnea and leg swelling.  Gastrointestinal:  Negative for abdominal pain, constipation, diarrhea, nausea and vomiting.  Genitourinary:  Negative for dysuria, flank pain, frequency, hematuria and urgency.  Musculoskeletal:  Negative for back pain, falls and joint pain.  Skin:  Negative for itching and rash.  Neurological:  Negative for dizziness and headaches.  Psychiatric/Behavioral:  Negative for depression, substance abuse and suicidal ideas. The patient is not nervous/anxious.    Past Medical  History:  Past Medical History:  Diagnosis Date   Abnormal Pap smear of cervix    Actinic keratosis    Anxiety    Basal cell carcinoma 04/14/2008   Right upper ant. thigh. BCC with sclerosis.   Basal cell carcinoma 08/14/2008   Right lat. lower thigh. Superficial.    Basal cell carcinoma 04/29/2019   Right mid forearm. Superficial and nodular patterns. EDC.   Basal cell carcinoma 08/17/2020   Right upper antecubital. EDC.   Kidney stones    MS (multiple sclerosis) ([place])    UTI (urinary tract infection)     Past Surgical History:  Past Surgical History:  Procedure Laterality Date   BREAST BIOPSY Right 2010   stereotactic biopsy/ neg/ dr.byrnett   BREAST BIOPSY Left 2012   neg/dr. brynett   CESAREAN SECTION  2008   [facility]   TONSILLECTOMY  2006    Gynecologic History:  Patient's last menstrual period was 10/21/2020. Menarche: 12  History of fibroids, polyps, or ovarian cysts? : no  History of PCOS? no Hstory of Endometriosis? no History of abnormal pap smears? no Have you had any sexually transmitted infections in the past?  HPV  Last Pap: Results were: 2020 NIL and HR HPV negative   She identifies as a female. She is sexually active with men.   She denies dyspareunia. She denies postcoital bleeding.    Obstetric History: G2P1011  Family History:  Family History  Problem Relation Age of Onset   Endometrial cancer Mother 58   Multiple sclerosis Sister 35   Cancer Sister 80       appendix  Autism Son        Infantile   Diabetes type II Other        Aunt   Bladder Cancer Neg Hx    Kidney cancer Neg Hx    Breast cancer Neg Hx     Social History:  Social History   Socioeconomic History   Marital status: Married    Spouse name: Not on file   Number of children: Not on file   Years of education: Not on file   Highest education level: Not on file  Occupational History   Not on file  Tobacco Use   Smoking status: Never   Smokeless tobacco: Never  Vaping  Use   Vaping Use: Never used  Substance and Sexual Activity   Alcohol use: Not Currently    Alcohol/week: 4.0 standard drinks    Types: 4 Glasses of wine per week   Drug use: No   Sexual activity: Yes    Birth control/protection: Surgical    Comment: Vasectomy  Other Topics Concern   Not on file  Social History Narrative   Not on file   Social Determinants of Health   Financial Resource Strain: Not on file  Food Insecurity: Not on file  Transportation Needs: Not on file  Physical Activity: Not on file  Stress: Not on file  Social Connections: Not on file  Intimate Partner Violence: Not on file    Allergies:  Allergies  Allergen Reactions   Amoxicillin Other (See Comments)   Other Other (See Comments)    Mushrooms - SOB   Sulfa Antibiotics     Other reaction(s): Unknown    Medications: Prior to Admission medications   Medication Sig Start Date End Date Taking? Authorizing Provider  Ascorbic Acid (VITAMIN C) 1000 MG tablet Take 1,000 mg by mouth daily.   Yes [provider]  baclofen (LIORESAL) 10 MG tablet 10 mg 2 (two) times daily. 08/31/14  Yes [provider]  buPROPion (WELLBUTRIN SR) 100 MG 12 hr tablet TAKE 1 TABLET BY MOUTH TWICE DAILY AT 8AM AND 3PM 03/18/17  Yes [provider]  Cholecalciferol 50 MCG (2000 UT) CAPS Take 2,000 Units by mouth.   Yes [provider]  clonazePAM (KLONOPIN) 0.5 MG tablet TAKE 1 TABLET BY MOUTH EVERY DAY AS NEEDED **STOP XANAX** 02/22/17  Yes [provider]  cyanocobalamin 2000 MCG tablet Take 2,000 mcg by mouth daily.   Yes [provider]  dalfampridine 10 MG TB12 Take 10 mg by mouth 2 (two) times daily.   Yes [provider]  Dimethyl Fumarate 240 MG CPDR 240 mg 1 day or 1 dose. 07/27/14  Yes [provider]  hydrocortisone 2.5 % cream Apply topically 2 (two) times daily as needed (Rash). Apply to affected areas of right ear lobe and fingers. 08/17/20  Yes  Brendolyn Patty, MD  sertraline (ZOLOFT) 50 MG tablet Take 100 mg by mouth.   Yes [provider]    Physical Exam Vitals: Blood pressure 128/72, height '5\' 9"'$  (1.753 m), weight 185 lb 6.4 oz (84.1 kg), last menstrual period 10/21/2020.  Physical Exam Constitutional:      Appearance: She is well-developed.  Genitourinary:     Genitourinary Comments: External: Normal appearing vulva. No lesions noted.  Bimanual examination: Uterus midline, non-tender, normal in size, shape and contour.  No CMT. No adnexal masses. No adnexal tenderness. Pelvis not fixed.  Breast Exam: breast equal without skin changes, nipple discharge, breast lump or enlarged lymph  nodes   HENT:     Head: Normocephalic and atraumatic.  Neck:     Thyroid: No thyromegaly.  Cardiovascular:     Rate and Rhythm: Normal rate and regular rhythm.     Heart sounds: Normal heart sounds.  Pulmonary:     Effort: Pulmonary effort is normal.     Breath sounds: Normal breath sounds.  Abdominal:     General: Bowel sounds are normal. There is no distension.     Palpations: Abdomen is soft. There is no mass.  Musculoskeletal:     Cervical back: Neck supple.  Neurological:     Mental Status: She is alert and oriented to person, place, and time.  Skin:    General: Skin is warm and dry.  Psychiatric:        Behavior: Behavior normal.        Thought Content: Thought content normal.        Judgment: Judgment normal.  Vitals reviewed.     Female chaperone present for pelvic and breast  portions of the physical exam  Assessment: 50 y.o. G2P1011 routine annual exam  Plan: Problem List Items Addressed This Visit   None Visit Diagnoses     Encounter for annual routine gynecological examination    -  Primary   Health maintenance examination       Breast cancer screening by mammogram       Encounter for gynecological examination with abnormal finding       Encounter for screening breast examination       Dysuria        Relevant Medications   ciprofloxacin (CIPRO) 500 MG tablet   Recurrent UTI       Relevant Medications   ciprofloxacin (CIPRO) 500 MG tablet   Stopped smoking with greater than 40 pack year history       Relevant Orders   Ambulatory Referral for Lung Cancer Scre       1) Mammogram - recommend yearly screening mammogram.  Mammogram Is up to date  2) STI screening was offered and declined  3) ASCCP guidelines and rational discussed.  Pap up to date, repeat in 3 years.  4) Contraception - vasectomy  5) Colonoscopy -- discussed options. She would like to follow up with her PCP regarding testing.   6) Routine healthcare maintenance including cholesterol, diabetes screening discussed managed by PCP  7) Reports she smoked 2 packs a day for 20 years. She quit 13 years ago. She is interested in lung cancer screening. Referral placed.   Adrian Prows MD, [facility], [facility] 11/15/2020 3:49 PM

## 2020-11-15 NOTE — Patient Instructions (Signed)
[employer] Recommended Dietary Allowances for Calcium and Vitamin D  Age (yr) Calcium Recommended Dietary Allowance (mg/day) Vitamin D Recommended Dietary Allowance (international units/day)  9-18 1,300 600  19-50 1,000 600  51-70 1,200 600  71 and older 1,200 800  Data from [employer]. Dietary reference intakes: calcium, vitamin D. [place], [place]: National [employer]ies [employer]; 2011.   Exercising to Stay Healthy To become healthy and stay healthy, it is recommended that you do moderate-intensity and vigorous-intensity exercise. You can tell that you are exercising at a moderate intensity if your heart starts beating faster and you start breathing faster but can still hold a conversation. You can tell that you are exercising at a vigorous intensity if you are breathing much harder andfaster and cannot hold a conversation while exercising. Exercising regularly is important. It has many health benefits, such as: Improving overall fitness, flexibility, and endurance. Increasing bone density. Helping with weight control. Decreasing body fat. Increasing muscle strength. Reducing stress and tension. Improving overall health. How often should I exercise? Choose an activity that you enjoy, and set realistic goals. Your health careprovider can help you make an activity plan that works for you. Exercise regularly as told by your health care provider. This may include: Doing strength training two times a week, such as: Lifting weights. Using resistance bands. Push-ups. Sit-ups. Yoga. Doing a certain intensity of exercise for a given amount of time. Choose from these options: A total of 150 minutes of moderate-intensity exercise every week. A total of 75 minutes of vigorous-intensity exercise every week. A mix of moderate-intensity and vigorous-intensity exercise every week. Children, pregnant women, people who have not exercised regularly, people who are overweight, and  older adults may need to talk with a health care provider about what activities are safe to do. If you have a medical condition, be sureto talk with your health care provider before you start a new exercise program. What are some exercise ideas? Moderate-intensity exercise ideas include: Walking 1 mile (1.6 km) in about 15 minutes. Biking. Hiking. Golfing. Dancing. Water aerobics. Vigorous-intensity exercise ideas include: Walking 4.5 miles (7.2 km) or more in about 1 hour. Jogging or running 5 miles (8 km) in about 1 hour. Biking 10 miles (16.1 km) or more in about 1 hour. Lap swimming. Roller-skating or in-line skating. Cross-country skiing. Vigorous competitive sports, such as football, basketball, and soccer. Jumping rope. Aerobic dancing. What are some everyday activities that can help me to get exercise? Yard work, such as: Pushing a lawn mower. Raking and bagging leaves. Washing your car. Pushing a stroller. Shoveling snow. Gardening. Washing windows or floors. How can I be more active in my day-to-day activities? Use stairs instead of an elevator. Take a walk during your lunch break. If you drive, park your car farther away from your work or school. If you take public transportation, get off one stop early and walk the rest of the way. Stand up or walk around during all of your indoor phone calls. Get up, stretch, and walk around every 30 minutes throughout the day. Enjoy exercise with a friend. Support to continue exercising will help you keep a regular routine of activity. What guidelines can I follow while exercising? Before you start a new exercise program, talk with your health care provider. Do not exercise so much that you hurt yourself, feel dizzy, or get very short of breath. Wear comfortable clothes and wear shoes with good support. Drink plenty of water while you exercise to prevent   dehydration or heat stroke. Work out until your breathing and your  heartbeat get faster. Where to find more information [place] Department of Health and Human Services: www.hhs.gov Centers for Disease Control and Prevention (CDC): www.cdc.gov Summary Exercising regularly is important. It will improve your overall fitness, flexibility, and endurance. Regular exercise also will improve your overall health. It can help you control your weight, reduce stress, and improve your bone density. Do not exercise so much that you hurt yourself, feel dizzy, or get very short of breath. Before you start a new exercise program, talk with your health care provider. This information is not intended to replace advice given to you by your health care provider. Make sure you discuss any questions you have with your healthcare provider. Document Revised: 02/27/2020 Document Reviewed: 02/27/2020 Elsevier Patient Education  2022 [employer]. Budget-Friendly Healthy Eating There are many ways to save money at the grocery store and continue to eat healthy. You can be successful if you: Plan meals according to your budget. Make a grocery list and only purchase food according to your grocery list. Prepare food yourself at home. What are tips for following this plan? Reading food labels Compare food labels between brand name foods and the store brand. Often the nutritional value is the same, but the store brand is lower cost. Look for products that do not have added sugar, fat, or salt (sodium). These often cost the same but are healthier for you. Products may be labeled as: Sugar-free. Nonfat. Low-fat. Sodium-free. Low-sodium. Look for lean ground beef labeled as at least 92% lean and 8% fat. Shopping  Buy only the items on your grocery list and go only to the areas of the store that have the items on your list. Use coupons only for foods and brands you normally buy. Avoid buying items you wouldn't normally buy simply because they are on sale. Check online and in newspapers for  weekly deals. Buy healthy items from the bulk bins when available, such as herbs, spices, flour, pasta, nuts, and dried fruit. Buy fruits and vegetables that are in season. Prices are usually lower on in-season produce. Look at the unit price on the price tag. Use it to compare different brands and sizes to find out which item is the best deal. Choose healthy items that are often low-cost, such as carrots, potatoes, apples, bananas, and oranges. Dried or canned beans are a low-cost protein source. Buy in bulk and freeze extra food. Items you can buy in bulk include meats, fish, poultry, frozen fruits, and frozen vegetables. Avoid buying "ready-to-eat" foods, such as pre-cut fruits and vegetables and pre-made salads. If possible, shop around to discover where you can find the best prices. Consider other retailers such as dollar stores, larger wholesale stores, local fruit and vegetable stands, and farmers markets. Do not shop when you are hungry. If you shop while hungry, it may be hard to stick to your list and budget. Resist impulse buying. Use your grocery list as your official plan for the week. Buy a variety of vegetables and fruits by purchasing fresh, frozen, and canned items. Look at the top and bottom shelves for deals. Foods at eye level (eye level of an adult or child) are usually more expensive. Be efficient with your time when shopping. The more time you spend at the store, the more money you are likely to spend. To save money when choosing more expensive foods like meats and dairy: Choose cheaper cuts of meat, such as bone-in   chicken thighs and drumsticks instead of skinless and boneless chicken. When you are ready to prepare the chicken, you can remove the skin yourself to make it healthier. Choose lean meats like chicken or turkey instead of beef. Choose canned seafood, such as tuna, salmon, or sardines. Buy eggs as a low-cost source of protein. Buy dried beans and peas, such as  lentils, split peas, or kidney beans instead of meats. Dried beans and peas are a good alternative source of protein. Buy the larger tubs of yogurt instead of individual-sized containers. Choose water instead of sodas and other sweetened beverages. Avoid buying chips, cookies, and other "junk food." These items are usually expensive and not healthy.  Cooking Make extra food and freeze the extras in meal-sized containers or in individual portions for fast meals and snacks. Pre-cook on days when you have extra time to prepare meals in advance. You can keep these meals in the fridge or freezer and reheat for a quick meal. When you come home from the grocery store, wash, peel, and cut fruits and vegetables so they are ready to use and eat. This will help reduce food waste. Meal planning Do not eat out or get fast food. Prepare food at home. Make a grocery list and make sure to bring it with you to the store. If you have a smart phone, you could use your phone to create your shopping list. Plan meals and snacks according to a grocery list and budget you create. Use leftovers in your meal plan for the week. Look for recipes where you can cook once and make enough food for two meals. Prepare budget-friendly types of meals like stews, casseroles, and stir-fry dishes. Try some meatless meals or try "no cook" meals like salads. Make sure that half your plate is filled with fruits or vegetables. Choose from fresh, frozen, or canned fruits and vegetables. If eating canned, remember to rinse them before eating. This will remove any excess salt added for packaging. Summary Eating healthy on a budget is possible if you plan your meals according to your budget, purchase according to your budget and grocery list, and prepare food yourself. Tips for buying more food on a limited budget include buying generic brands, using coupons only for foods you normally buy, and buying healthy items from the bulk bins when  available. Tips for buying cheaper food to replace expensive food include choosing cheaper, lean cuts of meat, and buying dried beans and peas. This information is not intended to replace advice given to you by your health care provider. Make sure you discuss any questions you have with your healthcare provider. Document Revised: 12/25/2019 Document Reviewed: 12/25/2019 Elsevier Patient Education  2022 [employer]. Bone Health Bones protect organs, store calcium, anchor muscles, and support the whole body. Keeping your bones strong is important, especially as you get older. Youcan take actions to help keep your bones strong and healthy. Why is keeping my bones healthy important?  Keeping your bones healthy is important because your body constantly replaces bone cells. Cells get old, and new cells take their place. As we age, we lose bone cells because the body may not be able to make enough new cells to replace the old cells. The amount of bone cells and bone tissue you have is referred toas bone mass. The higher your bone mass, the stronger your bones. The aging process leads to an overall loss of bone mass in the body, which can increase the likelihood of: Joint pain   and stiffness. Broken bones. A condition in which the bones become weak and brittle (osteoporosis). A large decline in bone mass occurs in older adults. In women, it occurs aboutthe time of menopause. What actions can I take to keep my bones healthy? Good health habits are important for maintaining healthy bones. This includes eating nutritious foods and exercising regularly. To have healthy bones, you need to get enough of the right minerals and vitamins. Most nutrition experts recommend getting these nutrients from the foods that you eat. In some cases, taking supplements may also be recommended. Doing certain types of exercise isalso important for bone health. What are the nutritional recommendations for healthy bones?  Eating  a well-balanced diet with plenty of calcium and vitamin D will help to protect your bones. Nutritional recommendations vary from person to person. Ask your health care provider what is healthy for you. Here are some generalguidelines. Get enough calcium Calcium is the most important (essential) mineral for bone health. Most people can get enough calcium from their diet, but supplements may be recommended for people who are at risk for osteoporosis. Good sources of calcium include: Dairy products, such as low-fat or nonfat milk, cheese, and yogurt. Dark green leafy vegetables, such as bok choy and broccoli. Calcium-fortified foods, such as orange juice, cereal, bread, soy beverages, and tofu products. Nuts, such as almonds. Follow these recommended amounts for daily calcium intake: Children, age 1-3: 700 mg. Children, age 4-8: 1,000 mg. Children, age 9-13: 1,300 mg. Teens, age 14-18: 1,300 mg. Adults, age 19-50: 1,000 mg. Adults, age 51-70: Men: 1,000 mg. Women: 1,200 mg. Adults, age 71 or older: 1,200 mg. Pregnant and breastfeeding females: Teens: 1,300 mg. Adults: 1,000 mg. Get enough vitamin D Vitamin D is the most essential vitamin for bone health. It helps the body absorb calcium. Sunlight stimulates the skin to make vitamin D, so be sure to get enough sunlight. If you live in a cold climate or you do not get outside often, your health care provider may recommend that you take vitamin D supplements. Good sources of vitamin D in your diet include: Egg yolks. Saltwater fish. Milk and cereal fortified with vitamin D. Follow these recommended amounts for daily vitamin D intake: Children and teens, age 1-18: 600 international units. Adults, age 50 or younger: 400-800 international units. Adults, age 51 or older: 800-1,000 international units. Get other important nutrients Other nutrients that are important for bone health include: Phosphorus. This mineral is found in meat, poultry,  dairy foods, nuts, and legumes. The recommended daily intake for adult men and adult women is 700 mg. Magnesium. This mineral is found in seeds, nuts, dark green vegetables, and legumes. The recommended daily intake for adult men is 400-420 mg. For adult women, it is 310-320 mg. Vitamin K. This vitamin is found in green leafy vegetables. The recommended daily intake is 120 mg for adult men and 90 mg for adult women. What type of physical activity is best for building and maintaining healthybones? Weight-bearing and strength-building activities are important for building and maintaining healthy bones. Weight-bearing activities cause muscles and bones to work against gravity. Strength-building activities increase the strength of the muscles that support bones. Weight-bearing and muscle-building activities include: Walking and hiking. Jogging and running. Dancing. Gym exercises. Lifting weights. Tennis and racquetball. Climbing stairs. Aerobics. Adults should get at least 30 minutes of moderate physical activity on most days. Children should get at least 60 minutes of moderate physical activity onmost days. Ask your health care   provider what type of exercise is best for you. How can I find out if my bone mass is low? Bone mass can be measured with an X-ray test called a bone mineral density (BMD) test. This test is recommended for all women who are age 65 or older. It may also be recommended for: Men who are age 70 or older. People who are at risk for osteoporosis because of: Having bones that break easily. Having a long-term disease that weakens bones, such as kidney disease or rheumatoid arthritis. Having menopause earlier than normal. Taking medicine that weakens bones, such as steroids, thyroid hormones, or hormone treatment for breast cancer or prostate cancer. Smoking. Drinking three or more alcoholic drinks a day. If you find that you have a low bone mass, you may be able to  preventosteoporosis or further bone loss by changing your diet and lifestyle. Where can I find more information? For more information, check out the following websites: National Osteoporosis Foundation: www.nof.org/patients National Institutes of Health: www.bones.nih.gov International Osteoporosis Foundation: www.iofbonehealth.org Summary The aging process leads to an overall loss of bone mass in the body, which can increase the likelihood of broken bones and osteoporosis. Eating a well-balanced diet with plenty of calcium and vitamin D will help to protect your bones. Weight-bearing and strength-building activities are also important for building and maintaining strong bones. Bone mass can be measured with an X-ray test called a bone mineral density (BMD) test. This information is not intended to replace advice given to you by your health care provider. Make sure you discuss any questions you have with your healthcare provider. Document Revised: 04/09/2017 Document Reviewed: 04/09/2017 Elsevier Patient Education  2022 [employer] Inc.  

## 2020-11-16 ENCOUNTER — Ambulatory Visit: Payer: BC Managed Care – PPO | Admitting: Physical Therapy

## 2020-11-23 ENCOUNTER — Encounter: Payer: Self-pay | Admitting: Physical Therapy

## 2020-11-23 ENCOUNTER — Ambulatory Visit: Payer: BC Managed Care – PPO | Admitting: Physical Therapy

## 2020-11-23 DIAGNOSIS — R262 Difficulty in walking, not elsewhere classified: Secondary | ICD-10-CM | POA: Diagnosis not present

## 2020-11-23 DIAGNOSIS — M6281 Muscle weakness (generalized): Secondary | ICD-10-CM

## 2020-11-23 NOTE — Therapy (Signed)
[facility] [address] [place], [address] Phone: [phone]   Fax:  [phone]  Physical Therapy Treatment  Patient Details  Name: Michelle Ruiz MRN: [id number] Date of Birth: Apr 07, 1970 No data recorded  Encounter Date: 11/23/2020   PT End of Session - 11/23/20 1712     Visit Number 141    Number of Visits 200    Date for PT Re-Evaluation 12/14/20    Authorization - Visit Number 1    Progress Note Due on Visit 10    PT Start Time [id number]    PT Stop Time 1723    PT Time Calculation (min) 38 min    Equipment Utilized During Treatment Gait belt    Activity Tolerance Patient tolerated treatment well;No increased pain    Behavior During Therapy [facility] for tasks assessed/performed;Anxious             Past Medical History:  Diagnosis Date   Abnormal Pap smear of cervix    Actinic keratosis    Anxiety    Basal cell carcinoma 04/14/2008   Right upper ant. thigh. BCC with sclerosis.   Basal cell carcinoma 08/14/2008   Right lat. lower thigh. Superficial.    Basal cell carcinoma 04/29/2019   Right mid forearm. Superficial and nodular patterns. EDC.   Basal cell carcinoma 08/17/2020   Right upper antecubital. EDC.   Kidney stones    MS (multiple sclerosis) ([place])    UTI (urinary tract infection)     Past Surgical History:  Procedure Laterality Date   BREAST BIOPSY Right 2010   stereotactic biopsy/ neg/ dr.byrnett   BREAST BIOPSY Left 2012   neg/dr. brynett   CESAREAN SECTION  2008   [facility]   TONSILLECTOMY  2006    There were no vitals filed for this visit.   Subjective Assessment - 11/23/20 1656     Subjective Pt reports a week and a half ago she was getting in an elevator with her hands full. She does not recall how fall occurred, but reports she tripped stepping in and turned on R ankle and fell attempting to catch the bar in the elevator onto her bottom. Reports there was some brief bruising  of the lateral ankle, current pain at inside of the ankle to touch, and when she puts all her weight on her L foot like to negotiate stairs, especially descent with plantarflexion eccentric lower. No pain today, much better than when it first happened.    Patient is accompained by: Family member    Pertinent History Patient reports history of falls x3 in the past 6 months secondary to drop foot on the L LE. PMH: Multiple sclerosis; elbow fracture of the R UE    Limitations Standing;Walking;Lifting    How long can you stand comfortably? 20 min    How long can you walk comfortably? 1 miles    Patient Stated Goals To be more balanced and core strengthening     Pain Onset More than a month ago               Therapeutic exercise Ankle assessment: Strong in PF/DF/Ev/Inv and in PF + Inv  No ttp at peroneals, achilles, tib/fib ligaments (high ankle); min TTP to posterior tibialis No edema/swelling present  R heel raise lower from step 2x 10 with cuing for max height   Squats on bosu ball 2 x 10 reps with CGA; two fingers assist on treadmill (barefoot)  SL RDL 10# DB 2x 6 bilat with min cuing for set up with good carry over    Squat jump x5 with focus on increased height with good carry over   Rest breaks given between sets                            PT Education - 11/23/20 1712     Education Details therex form/technique    Person(s) Educated Patient    Methods Explanation;Demonstration;Verbal cues    Comprehension Verbalized understanding;Returned demonstration;Verbal cues required                 PT Long Term Goals - 08/05/20 1740       PT LONG TERM GOAL #1   Title Patient will be independent with balance HEP to continue benefits of therapy after discharge.    Baseline dependent with form/technique for balance exercise; 03/25/2018: Independent     Time 6    Period Weeks    Status Achieved      PT LONG TERM GOAL #2   Title Patient will  improve FGA by 4 points to indicate significant improvement with balancing while ambulating and decrease of fall risk    Baseline 03/25/2018: 28/30; 04/10/2019: 22; 07/21/2019: 29; 09/17/2019: 30/30    Time 6    Period Weeks    Status Achieved      PT LONG TERM GOAL #3   Title Patient will be able to perform prolonged single leg stance >10sec with eyes closed to indicate improvement with static balance and decrease fall risk    Baseline 2 sec in standing EC; 03/25/2018: 3 sec; 04/29/2018: 4 sec; 08/21/2018: 5 sec; 09/19/2018: 6 sec ; 10/21/2018 3 sec; 11/21/2018 5 sec; 01/08/2019: 9 sec; 02/25/2019: 14sec    Time 4    Period Weeks    Status Achieved      PT LONG TERM GOAL #4   Title Pt will improve 6 MWT >2018f to demonstrate improved cardiorespiratory fitness    Baseline Deferred to next session: 04/29/2018; 05/01/2018: 14570f 08/21/2018: 14005f6/25/2020: 1435f49f/27/2020 1621; 11/21/2018 deferred to next session 2/2 time.; 01/08/2019: 1480 ft; 02/25/2019: 1300ft82f14/2021: 1400 ft, 05/13/19: 1321 ft; 07/21/2019: 1170 ft; 09/17/2019: 1260ft;29f3/2021: 1250ft; 27f9/21: 1353; 1E3041421/21: 1275ft; 3106f2022: 1210ft    58f 4    Period Weeks    Status Deferred      PT LONG TERM GOAL #5   Title Patient will be able to run 200ft with34fstopping to better be able to return to recreational activites and address cardiovascular endurance.    Baseline 33ft; 12/04f20: 90ft; 1/14/37f deferred    Time 4    Period Weeks    Status Deferred      PT LONG TERM GOAL #6   Title Patient will improve L hip flexion strength to 4/5 for improved steppage gait and reduced fall risk.    Baseline 04/10/2019: L hip flexion 3+/5; 07/21/2019: L Hip 4/5    Time 6    Period Weeks    Status Achieved      PT LONG TERM GOAL #7   Title Patient will be able to lift her affected L LE to the same extent as the R LE to allow for greater amount of foot clearance when ambulating    Baseline L LE: 10", R LE: 15"; 11/17/2019: L LE:  14", R LE 17.5"; 01/13/2020: L LE: 15.5", R LE 18"; 12/20: 18" B  Time 8    Period Weeks    Status Achieved      PT LONG TERM GOAL #8   Title Patient will have full dorsiflexion with 5/5 strength to allow for greater toe clearance with the performance of exercises, most notably when walking.    Baseline L 4/5 5 degree ; R 5/5 12 degree; L 4+/5 5 degree ; R 5/5 12 degree    Time 8    Period Weeks    Status Achieved      PT LONG TERM GOAL  #9   TITLE Patient will have no scuffing along L LE with performance of 6 min WT to indicate    Baseline 06/17/2020: No scuffing during today    Time 6    Period Weeks    Status Achieved      PT LONG TERM GOAL  #10   TITLE Patient will improve her ABC by 13% to indicate significant improvement in confidence with her balance    Baseline 56.25%; 08/05/20 86.2%    Time 6    Period Weeks    Status Achieved      PT LONG TERM GOAL  #11   TITLE Patient will improve FGA by 3 points to indicate significant improvement in fucntioncal gait and balance with walking.    Baseline 08/05/20 22/30    Time 6    Period Weeks    Status On-going                   Plan - 11/24/20 1141     Clinical Impression Statement PT assessed patients ankle this session following residual pain after a fall stepping into an elevator; intregity of all tendons and soft tissue in tact, with good strength throughout, possible grade 1 tib posterior strain. PT continued therex progression for increased balance and general strength, with focus on ankle strength and stability with success. Patient is able to comply with all cuing for proper technique of therex with good motivation and no increased pain throughout session. PT will continue progression as able.    Personal Factors and Comorbidities Past/Current Experience    Examination-Activity Limitations Locomotion Level    Examination-Participation Restrictions Community Activity    Stability/Clinical Decision Making  Evolving/Moderate complexity    Clinical Decision Making Moderate    Rehab Potential Good    Clinical Impairments Affecting Rehab Potential (-) hx of MS; (+) good motivation    PT Frequency 2x / week    PT Duration 12 weeks    PT Treatment/Interventions Moist Heat;Iontophoresis '4mg'$ /ml Dexamethasone;Electrical Stimulation;Aquatic Therapy;Cryotherapy;Therapeutic activities;Neuromuscular re-education;Therapeutic exercise;Patient/family education;Dry needling;Manual techniques;Passive range of motion;Balance training;Functional mobility training;Gait training;Stair training;Joint Manipulations;Prosthetic Training    PT Next Visit Plan Dynamic balance and LE strength exercise    PT Home Exercise Plan hip flexion in sitting for strengthening, hip flexion knee flexion in standing for balance, bike intervals at 70%-75% max HR    Consulted and Agree with Plan of Care Patient             Patient will benefit from skilled therapeutic intervention in order to improve the following deficits and impairments:  Pain, Decreased coordination, Impaired perceived functional ability, Increased fascial restricitons, Increased muscle spasms, Difficulty walking, Abnormal gait, Decreased balance, Decreased mobility, Impaired sensation, Increased edema  Visit Diagnosis: Difficulty in walking, not elsewhere classified  Muscle weakness (generalized)     Problem List There are no problems to display for this patient.  Durwin Reges DPT Durwin Reges 11/24/2020, 11:49 AM  [facility] [address] [place], [address] Phone: [phone]   Fax:  [phone]  Name: Michelle Ruiz MRN: [id number] Date of Birth: 1970-09-02

## 2020-12-07 ENCOUNTER — Ambulatory Visit: Payer: BC Managed Care – PPO | Attending: Neurology | Admitting: Physical Therapy

## 2020-12-07 ENCOUNTER — Encounter: Payer: Self-pay | Admitting: Physical Therapy

## 2020-12-07 DIAGNOSIS — R262 Difficulty in walking, not elsewhere classified: Secondary | ICD-10-CM | POA: Insufficient documentation

## 2020-12-07 NOTE — Therapy (Signed)
[facility] [address], [place], [address] Phone: [phone]   Fax:  [phone]  Physical Therapy Treatment  Patient Details  Name: Michelle Ruiz MRN: [id number] Date of Birth: Aug 26, 1970 No data recorded  Encounter Date: 12/07/2020   PT End of Session - 12/07/20 1728     Visit Number 142    Number of Visits 200    Date for PT Re-Evaluation 12/14/20    Authorization - Visit Number 2    Progress Note Due on Visit 10    PT Start Time [id number]    PT Stop Time 1728    PT Time Calculation (min) 41 min    Equipment Utilized During Treatment Gait belt    Activity Tolerance Patient tolerated treatment well;No increased pain    Behavior During Therapy [facility] for tasks assessed/performed;Anxious             Past Medical History:  Diagnosis Date   Abnormal Pap smear of cervix    Actinic keratosis    Anxiety    Basal cell carcinoma 04/14/2008   Right upper ant. thigh. BCC with sclerosis.   Basal cell carcinoma 08/14/2008   Right lat. lower thigh. Superficial.    Basal cell carcinoma 04/29/2019   Right mid forearm. Superficial and nodular patterns. EDC.   Basal cell carcinoma 08/17/2020   Right upper antecubital. EDC.   Kidney stones    MS (multiple sclerosis) ([place])    UTI (urinary tract infection)     Past Surgical History:  Procedure Laterality Date   BREAST BIOPSY Right 2010   stereotactic biopsy/ neg/ dr.byrnett   BREAST BIOPSY Left 2012   neg/dr. brynett   CESAREAN SECTION  2008   [facility]   TONSILLECTOMY  2006    There were no vitals filed for this visit.   Subjective Assessment - 12/07/20 1653     Subjective Pt continues to report issues with R ankle but denies any pain this vist. She reports continuing walking program.    Pertinent History Patient reports history of falls x3 in the past 6 months secondary to drop foot on the L LE. PMH: Multiple sclerosis; elbow fracture of the R UE     Limitations Standing;Walking;Lifting    How long can you stand comfortably? 20 min    How long can you walk comfortably? 1 miles    Patient Stated Goals To be more balanced and core strengthening             Therapeutic Exercise   Recumbent bike L2 x 3 min  Leg swings Flex/ABD 1 x 10 reps each LE  Bilat heel raise lower from step 1 x 25 reps with cuing for max height   Squats on bosu ball 2 x 10 reps with CGA; two fingers assist on treadmill   Pop Squats x 8 reps with good carry over following demonstration  TRX Reverse Lunges 1 x 5 reps each LE with good carry over following PT demonstration  Calf stretch on stairs 2 x 30 sec   Rest breaks given between sets                            PT Education - 12/07/20 1730     Education Details therex form/technique    Person(s) Educated Patient    Methods Explanation;Demonstration    Comprehension Verbalized understanding;Returned demonstration  PT Long Term Goals - 08/05/20 1740       PT LONG TERM GOAL #1   Title Patient will be independent with balance HEP to continue benefits of therapy after discharge.    Baseline dependent with form/technique for balance exercise; 03/25/2018: Independent     Time 6    Period Weeks    Status Achieved      PT LONG TERM GOAL #2   Title Patient will improve FGA by 4 points to indicate significant improvement with balancing while ambulating and decrease of fall risk    Baseline 03/25/2018: 28/30; 04/10/2019: 22; 07/21/2019: 29; 09/17/2019: 30/30    Time 6    Period Weeks    Status Achieved      PT LONG TERM GOAL #3   Title Patient will be able to perform prolonged single leg stance >10sec with eyes closed to indicate improvement with static balance and decrease fall risk    Baseline 2 sec in standing EC; 03/25/2018: 3 sec; 04/29/2018: 4 sec; 08/21/2018: 5 sec; 09/19/2018: 6 sec ; 10/21/2018 3 sec; 11/21/2018 5 sec; 01/08/2019: 9 sec; 02/25/2019:  14sec    Time 4    Period Weeks    Status Achieved      PT LONG TERM GOAL #4   Title Pt will improve 6 MWT >2044f to demonstrate improved cardiorespiratory fitness    Baseline Deferred to next session: 04/29/2018; 05/01/2018: 14546f 08/21/2018: 140055f6/25/2020: 1435f89f/27/2020 1621; 11/21/2018 deferred to next session 2/2 time.; 01/08/2019: 1480 ft; 02/25/2019: 1300ft58f14/2021: 1400 ft, 05/13/19: 1321 ft; 07/21/2019: 1170 ft; 09/17/2019: 1260ft;35f3/2021: 1250ft; 71f9/21: 1353; 1E3041421/21: 1275ft; 371f2022: 1210ft    76f 4    Period Weeks    Status Deferred      PT LONG TERM GOAL #5   Title Patient will be able to run 200ft with50fstopping to better be able to return to recreational activites and address cardiovascular endurance.    Baseline 33ft; 12/024f20: 90ft; 1/14/3f deferred    Time 4    Period Weeks    Status Deferred      PT LONG TERM GOAL #6   Title Patient will improve L hip flexion strength to 4/5 for improved steppage gait and reduced fall risk.    Baseline 04/10/2019: L hip flexion 3+/5; 07/21/2019: L Hip 4/5    Time 6    Period Weeks    Status Achieved      PT LONG TERM GOAL #7   Title Patient will be able to lift her affected L LE to the same extent as the R LE to allow for greater amount of foot clearance when ambulating    Baseline L LE: 10", R LE: 15"; 11/17/2019: L LE: 14", R LE 17.5"; 01/13/2020: L LE: 15.5", R LE 18"; 12/20: 18" B    Time 8    Period Weeks    Status Achieved      PT LONG TERM GOAL #8   Title Patient will have full dorsiflexion with 5/5 strength to allow for greater toe clearance with the performance of exercises, most notably when walking.    Baseline L 4/5 5 degree ; R 5/5 12 degree; L 4+/5 5 degree ; R 5/5 12 degree    Time 8    Period Weeks    Status Achieved      PT LONG TERM GOAL  #9   TITLE Patient will have no scuffing along L LE with performance of 6 min WT to indicate  Baseline 06/17/2020: No scuffing during today    Time 6     Period Weeks    Status Achieved      PT LONG TERM GOAL  #10   TITLE Patient will improve her ABC by 13% to indicate significant improvement in confidence with her balance    Baseline 56.25%; 08/05/20 86.2%    Time 6    Period Weeks    Status Achieved      PT LONG TERM GOAL  #11   TITLE Patient will improve FGA by 3 points to indicate significant improvement in fucntioncal gait and balance with walking.    Baseline 08/05/20 22/30    Time 6    Period Weeks    Status On-going                   Plan - 12/07/20 1725     Clinical Impression Statement PT continued LE strengthening and dynamic balance progression with frequent rest breaks given between sets for adequate recovery. Pt continues to display decreased balance and decreased activity tolerance that requires supervision/CGA for occasional lose of balance. Pt demonstrated good form/technique with therex with occasional cueing for correction and increased strengthening effect. Patient will continue to benefit from skilled therapy for exercise performance to ensure proper technique for safe and effective progression.    Personal Factors and Comorbidities Past/Current Experience    Examination-Activity Limitations Locomotion Level    Examination-Participation Restrictions Community Activity    Stability/Clinical Decision Making Evolving/Moderate complexity    Clinical Decision Making Moderate    Rehab Potential Good    PT Frequency 2x / week    PT Duration 12 weeks    PT Treatment/Interventions Moist Heat;Iontophoresis '4mg'$ /ml Dexamethasone;Electrical Stimulation;Aquatic Therapy;Cryotherapy;Therapeutic activities;Neuromuscular re-education;Therapeutic exercise;Patient/family education;Dry needling;Manual techniques;Passive range of motion;Balance training;Functional mobility training;Gait training;Stair training;Joint Manipulations;Prosthetic Training    PT Next Visit Plan Dynamic balance and LE strength exercise    PT Home  Exercise Plan hip flexion in sitting for strengthening, hip flexion knee flexion in standing for balance, bike intervals at 70%-75% max HR             Patient will benefit from skilled therapeutic intervention in order to improve the following deficits and impairments:  Pain, Decreased coordination, Impaired perceived functional ability, Increased fascial restricitons, Increased muscle spasms, Difficulty walking, Abnormal gait, Decreased balance, Decreased mobility, Impaired sensation, Increased edema  Visit Diagnosis: Difficulty in walking, not elsewhere classified     Problem List There are no problems to display for this patient.   Durwin Reges DPT Sharion Settler, SPT  Durwin Reges, PT 12/08/2020, 12:51 PM  [facility] [address] [place], [address] Phone: [phone]   Fax:  [phone]  Name: Michelle Ruiz MRN: [id number] Date of Birth: 07-Sep-1970

## 2020-12-14 ENCOUNTER — Ambulatory Visit: Payer: BC Managed Care – PPO

## 2020-12-14 ENCOUNTER — Encounter: Payer: Self-pay | Admitting: Physical Therapy

## 2020-12-14 DIAGNOSIS — R262 Difficulty in walking, not elsewhere classified: Secondary | ICD-10-CM | POA: Diagnosis not present

## 2020-12-14 NOTE — Therapy (Signed)
[facility] [address] [place], [address] Phone: [phone]   Fax:  [phone]  Physical Therapy Treatment  Patient Details  Name: Michelle Ruiz MRN: [id number] Date of Birth: 06/17/1970 No data recorded  Encounter Date: 12/14/2020   PT End of Session - 12/14/20 1727     Visit Number 143    Number of Visits 200    Date for PT Re-Evaluation 12/14/20    Authorization - Visit Number 3    Progress Note Due on Visit 10    PT Start Time 9798    PT Stop Time 1728    PT Time Calculation (min) 38 min    Equipment Utilized During Treatment Gait belt    Activity Tolerance Patient tolerated treatment well    Behavior During Therapy WFL for tasks assessed/performed             Past Medical History:  Diagnosis Date   Abnormal Pap smear of cervix    Actinic keratosis    Anxiety    Basal cell carcinoma 04/14/2008   Right upper ant. thigh. BCC with sclerosis.   Basal cell carcinoma 08/14/2008   Right lat. lower thigh. Superficial.    Basal cell carcinoma 04/29/2019   Right mid forearm. Superficial and nodular patterns. EDC.   Basal cell carcinoma 08/17/2020   Right upper antecubital. EDC.   Kidney stones    MS (multiple sclerosis) ([place])    UTI (urinary tract infection)     Past Surgical History:  Procedure Laterality Date   BREAST BIOPSY Right 2010   stereotactic biopsy/ neg/ dr.byrnett   BREAST BIOPSY Left 2012   neg/dr. brynett   CESAREAN SECTION  2008   [facility]   TONSILLECTOMY  2006    There were no vitals filed for this visit.   Subjective Assessment - 12/14/20 1652     Subjective Pt reports no pain in R ankle today. She denies any falls since  or soreness since last visit.    Pertinent History Patient reports history of falls x3 in the past 6 months secondary to drop foot on the L LE. PMH: Multiple sclerosis; elbow fracture of the R UE    Limitations Standing;Walking;Lifting    How long  can you stand comfortably? 20 min    How long can you walk comfortably? 1 miles    Patient Stated Goals To be more balanced and core strengthening              Therapeutic Exercise    Leg swings Flex/ABD 1 x 10 reps each LE   FWD lunges on bosu ball 1 x 10 reps BLE without UE support and CGA  TRX Squat 2 x 10 reps with emphasis on depth and tempo  Jump Squats 1 x 10 reps with emphasis on hip/knee extension and plantarflexion   Pop Squats 1 x 8 reps with good carry over following demonstration; emphasis on maintaining tempo  [employer] 20# KB x 100 ft with R UE; x 100 20#KB in L UE   Calf stretch on stairs 2 x 30 sec    Rest breaks given between sets    PT Education - 12/14/20 1654     Education Details therex form/technique    Person(s) Educated Patient    Methods Explanation;Demonstration    Comprehension Verbalized understanding;Returned demonstration                 PT Long Term Goals -  08/05/20 1740       PT LONG TERM GOAL #1   Title Patient will be independent with balance HEP to continue benefits of therapy after discharge.    Baseline dependent with form/technique for balance exercise; 03/25/2018: Independent     Time 6    Period Weeks    Status Achieved      PT LONG TERM GOAL #2   Title Patient will improve FGA by 4 points to indicate significant improvement with balancing while ambulating and decrease of fall risk    Baseline 03/25/2018: 28/30; 04/10/2019: 22; 07/21/2019: 29; 09/17/2019: 30/30    Time 6    Period Weeks    Status Achieved      PT LONG TERM GOAL #3   Title Patient will be able to perform prolonged single leg stance >10sec with eyes closed to indicate improvement with static balance and decrease fall risk    Baseline 2 sec in standing EC; 03/25/2018: 3 sec; 04/29/2018: 4 sec; 08/21/2018: 5 sec; 09/19/2018: 6 sec ; 10/21/2018 3 sec; 11/21/2018 5 sec; 01/08/2019: 9 sec; 02/25/2019: 14sec    Time 4    Period Weeks    Status Achieved       PT LONG TERM GOAL #4   Title Pt will improve 6 MWT >2047ft to demonstrate improved cardiorespiratory fitness    Baseline Deferred to next session: 04/29/2018; 05/01/2018: 1470ft; 08/21/2018: 1434ft; 09/19/2018: 1478ft; 10/21/2018 1621; 11/21/2018 deferred to next session 2/2 time.; 01/08/2019: 1480 ft; 02/25/2019: 1351ft; 04/10/2019: 1400 ft, 05/13/19: 1321 ft; 07/21/2019: 1170 ft; 09/17/2019: 1287ft; 11/17/2019: 1289ft; 01/13/20: 6720; 03/15/20: 1233ft; 06/17/2020: 129ft    Time 4    Period Weeks    Status Deferred      PT LONG TERM GOAL #5   Title Patient will be able to run 237ft without stopping to better be able to return to recreational activites and address cardiovascular endurance.    Baseline 52ft; 02/25/2019: 53ft; 04/10/2019 deferred    Time 4    Period Weeks    Status Deferred      PT LONG TERM GOAL #6   Title Patient will improve L hip flexion strength to 4/5 for improved steppage gait and reduced fall risk.    Baseline 04/10/2019: L hip flexion 3+/5; 07/21/2019: L Hip 4/5    Time 6    Period Weeks    Status Achieved      PT LONG TERM GOAL #7   Title Patient will be able to lift her affected L LE to the same extent as the R LE to allow for greater amount of foot clearance when ambulating    Baseline L LE: 10", R LE: 15"; 11/17/2019: L LE: 14", R LE 17.5"; 01/13/2020: L LE: 15.5", R LE 18"; 12/20: 18" B    Time 8    Period Weeks    Status Achieved      PT LONG TERM GOAL #8   Title Patient will have full dorsiflexion with 5/5 strength to allow for greater toe clearance with the performance of exercises, most notably when walking.    Baseline L 4/5 5 degree ; R 5/5 12 degree; L 4+/5 5 degree ; R 5/5 12 degree    Time 8    Period Weeks    Status Achieved      PT LONG TERM GOAL  #9   TITLE Patient will have no scuffing along L LE with performance of 6 min WT to indicate    Baseline 06/17/2020: No  scuffing during today    Time 6    Period Weeks    Status Achieved      PT LONG  TERM GOAL  #10   TITLE Patient will improve her ABC by 13% to indicate significant improvement in confidence with her balance    Baseline 56.25%; 08/05/20 86.2%    Time 6    Period Weeks    Status Achieved      PT LONG TERM GOAL  #11   TITLE Patient will improve FGA by 3 points to indicate significant improvement in fucntioncal gait and balance with walking.    Baseline 08/05/20 22/30    Time 6    Period Weeks    Status On-going                   Plan - 12/15/20 0934     Clinical Impression Statement PT continued LE strengthening and dynamic balance progression with frequent rest breaks given between sets for adequate recovery. Pt continues to display decreased balance and decreased activity tolerance that requires supervision/CGA for occasional loss of balance. PT focused on increased tempo with activities this session to improve pt LE/cardiovascular endurance.  Patient will continue to benefit from skilled therapy for exercise performance to ensure proper technique for safe and effective progression.    Personal Factors and Comorbidities Past/Current Experience    Examination-Activity Limitations Locomotion Level    Examination-Participation Restrictions Community Activity    Stability/Clinical Decision Making Evolving/Moderate complexity    Clinical Decision Making Moderate    Rehab Potential Good    Clinical Impairments Affecting Rehab Potential (-) hx of MS; (+) good motivation    PT Frequency 2x / week    PT Duration 12 weeks    PT Treatment/Interventions Moist Heat;Iontophoresis 4mg /ml Dexamethasone;Electrical Stimulation;Aquatic Therapy;Cryotherapy;Therapeutic activities;Neuromuscular re-education;Therapeutic exercise;Patient/family education;Dry needling;Manual techniques;Passive range of motion;Balance training;Functional mobility training;Gait training;Stair training;Joint Manipulations;Prosthetic Training    PT Next Visit Plan Dynamic balance and LE strength exercise     PT Home Exercise Plan hip flexion in sitting for strengthening, hip flexion knee flexion in standing for balance, bike intervals at 70%-75% max HR    Consulted and Agree with Plan of Care Patient             Patient will benefit from skilled therapeutic intervention in order to improve the following deficits and impairments:  Pain, Decreased coordination, Impaired perceived functional ability, Increased fascial restricitons, Increased muscle spasms, Difficulty walking, Abnormal gait, Decreased balance, Decreased mobility, Impaired sensation, Increased edema  Visit Diagnosis: Difficulty in walking, not elsewhere classified     Problem List There are no problems to display for this patient.  Sharion Settler, SPT   Strandquist Fairly IV, PT, DPT Physical Therapist- [facility]  12/15/2020, [facility] [address], [place], [address] Phone: [phone]   Fax:  [phone]  Name: Michelle Ruiz MRN: [id number] Date of Birth: [birth year]

## 2020-12-28 ENCOUNTER — Ambulatory Visit: Payer: BC Managed Care – PPO | Admitting: Physical Therapy

## 2020-12-30 ENCOUNTER — Encounter: Payer: Self-pay | Admitting: Physical Therapy

## 2020-12-30 ENCOUNTER — Ambulatory Visit: Payer: BC Managed Care – PPO | Attending: Neurology | Admitting: Physical Therapy

## 2020-12-30 DIAGNOSIS — R262 Difficulty in walking, not elsewhere classified: Secondary | ICD-10-CM | POA: Diagnosis not present

## 2020-12-30 DIAGNOSIS — Z9181 History of falling: Secondary | ICD-10-CM | POA: Insufficient documentation

## 2020-12-30 DIAGNOSIS — M6281 Muscle weakness (generalized): Secondary | ICD-10-CM | POA: Diagnosis present

## 2020-12-30 NOTE — Therapy (Signed)
[facility] [address], [place], [address] Phone: [phone]   Fax:  [phone]  Physical Therapy Treatment  Patient Details  Name: Michelle Ruiz MRN: [id number] Date of Birth: 01-19-1971 No data recorded  Encounter Date: 12/30/2020   PT End of Session - 12/30/20 1534     Visit Number 144    Number of Visits 200    Date for PT Re-Evaluation 12/14/20    Authorization - Visit Number 4    Progress Note Due on Visit 10    PT Start Time 5597    PT Stop Time 1555    PT Time Calculation (min) 40 min    Activity Tolerance Patient tolerated treatment well    Behavior During Therapy [facility] for tasks assessed/performed             Past Medical History:  Diagnosis Date   Abnormal Pap smear of cervix    Actinic keratosis    Anxiety    Basal cell carcinoma 04/14/2008   Right upper ant. thigh. BCC with sclerosis.   Basal cell carcinoma 08/14/2008   Right lat. lower thigh. Superficial.    Basal cell carcinoma 04/29/2019   Right mid forearm. Superficial and nodular patterns. EDC.   Basal cell carcinoma 08/17/2020   Right upper antecubital. EDC.   Kidney stones    MS (multiple sclerosis) ([place])    UTI (urinary tract infection)     Past Surgical History:  Procedure Laterality Date   BREAST BIOPSY Right 2010   stereotactic biopsy/ neg/ dr.byrnett   BREAST BIOPSY Left 2012   neg/dr. brynett   CESAREAN SECTION  2008   [facility]   TONSILLECTOMY  2006    There were no vitals filed for this visit.   Subjective Assessment - 12/30/20 1515     Subjective Reports fatigue following COVID booster shot. Patient reports no other changes, pain or falls to report    Patient is accompained by: Family member    Pertinent History Patient reports history of falls x3 in the past 6 months secondary to drop foot on the L LE. PMH: Multiple sclerosis; elbow fracture of the R UE    Limitations Standing;Walking;Lifting    How  long can you stand comfortably? 20 min    How long can you walk comfortably? 1 miles    Patient Stated Goals To be more balanced and core strengthening     Pain Onset More than a month ago             Therapeutic Exercise    Leg swings Flex/ABD 1 x 10 reps each LE   ABD slider in mini squat x12 each side with good carry over demo  Alt reverse lunge with unilateral TRX support 2x 12 with min difficulty with spasticity  Squat w/ overhead press 3# 3x 6 with focus on eccentric lower, with fast stand with good carry over  Standing figure 8 3lb ball in pillow case standing on foam 2x 20sec CGA for safety           PT Long Term Goals - 08/05/20 1740       PT LONG TERM GOAL #1   Title Patient will be independent with balance HEP to continue benefits of therapy after discharge.    Baseline dependent with form/technique for balance exercise; 03/25/2018: Independent     Time 6    Period Weeks    Status Achieved  PT LONG TERM GOAL #2   Title Patient will improve FGA by 4 points to indicate significant improvement with balancing while ambulating and decrease of fall risk    Baseline 03/25/2018: 28/30; 04/10/2019: 22; 07/21/2019: 29; 09/17/2019: 30/30    Time 6    Period Weeks    Status Achieved      PT LONG TERM GOAL #3   Title Patient will be able to perform prolonged single leg stance >10sec with eyes closed to indicate improvement with static balance and decrease fall risk    Baseline 2 sec in standing EC; 03/25/2018: 3 sec; 04/29/2018: 4 sec; 08/21/2018: 5 sec; 09/19/2018: 6 sec ; 10/21/2018 3 sec; 11/21/2018 5 sec; 01/08/2019: 9 sec; 02/25/2019: 14sec    Time 4    Period Weeks    Status Achieved      PT LONG TERM GOAL #4   Title Pt will improve 6 MWT >2031ft to demonstrate improved cardiorespiratory fitness    Baseline Deferred to next session: 04/29/2018; 05/01/2018: 1475ft; 08/21/2018: 1446ft; 09/19/2018: 1490ft; 10/21/2018 1621; 11/21/2018 deferred to next session 2/2 time.;  01/08/2019: 1480 ft; 02/25/2019: 1329ft; 04/10/2019: 1400 ft, 05/13/19: 1321 ft; 07/21/2019: 1170 ft; 09/17/2019: 1230ft; 11/17/2019: 1261ft; 01/13/20: 7782; 03/15/20: 1268ft; 06/17/2020: 1277ft    Time 4    Period Weeks    Status Deferred      PT LONG TERM GOAL #5   Title Patient will be able to run 236ft without stopping to better be able to return to recreational activites and address cardiovascular endurance.    Baseline 58ft; 02/25/2019: 96ft; 04/10/2019 deferred    Time 4    Period Weeks    Status Deferred      PT LONG TERM GOAL #6   Title Patient will improve L hip flexion strength to 4/5 for improved steppage gait and reduced fall risk.    Baseline 04/10/2019: L hip flexion 3+/5; 07/21/2019: L Hip 4/5    Time 6    Period Weeks    Status Achieved      PT LONG TERM GOAL #7   Title Patient will be able to lift her affected L LE to the same extent as the R LE to allow for greater amount of foot clearance when ambulating    Baseline L LE: 10", R LE: 15"; 11/17/2019: L LE: 14", R LE 17.5"; 01/13/2020: L LE: 15.5", R LE 18"; 12/20: 18" B    Time 8    Period Weeks    Status Achieved      PT LONG TERM GOAL #8   Title Patient will have full dorsiflexion with 5/5 strength to allow for greater toe clearance with the performance of exercises, most notably when walking.    Baseline L 4/5 5 degree ; R 5/5 12 degree; L 4+/5 5 degree ; R 5/5 12 degree    Time 8    Period Weeks    Status Achieved      PT LONG TERM GOAL  #9   TITLE Patient will have no scuffing along L LE with performance of 6 min WT to indicate    Baseline 06/17/2020: No scuffing during today    Time 6    Period Weeks    Status Achieved      PT LONG TERM GOAL  #10   TITLE Patient will improve her ABC by 13% to indicate significant improvement in confidence with her balance    Baseline 56.25%; 08/05/20 86.2%    Time 6  Period Weeks    Status Achieved      PT LONG TERM GOAL  #11   TITLE Patient will improve FGA by 3 points  to indicate significant improvement in fucntioncal gait and balance with walking.    Baseline 08/05/20 22/30    Time 6    Period Weeks    Status On-going                   Plan - 12/30/20 1631     Clinical Impression Statement PT continued therex progression for dynamic balance and endurance this session with success. Patient with some increased spasticity issues this session, that has persisted following COVID booster shot. PT increased rest breaks to allow for increased recovery, and modified therex to accomodate with success. Patient is able to complete all therex with min cuing for proper technique of therex. PT will continue progression as able.    Personal Factors and Comorbidities Past/Current Experience    Examination-Activity Limitations Locomotion Level    Examination-Participation Restrictions Community Activity    Stability/Clinical Decision Making Evolving/Moderate complexity    Clinical Decision Making Moderate    Rehab Potential Good    Clinical Impairments Affecting Rehab Potential (-) hx of MS; (+) good motivation    PT Frequency 2x / week    PT Duration 12 weeks    PT Treatment/Interventions Moist Heat;Iontophoresis 4mg /ml Dexamethasone;Electrical Stimulation;Aquatic Therapy;Cryotherapy;Therapeutic activities;Neuromuscular re-education;Therapeutic exercise;Patient/family education;Dry needling;Manual techniques;Passive range of motion;Balance training;Functional mobility training;Gait training;Stair training;Joint Manipulations;Prosthetic Training    PT Next Visit Plan Dynamic balance and LE strength exercise    PT Home Exercise Plan hip flexion in sitting for strengthening, hip flexion knee flexion in standing for balance, bike intervals at 70%-75% max HR    Consulted and Agree with Plan of Care Patient             Patient will benefit from skilled therapeutic intervention in order to improve the following deficits and impairments:  Pain, Decreased  coordination, Impaired perceived functional ability, Increased fascial restricitons, Increased muscle spasms, Difficulty walking, Abnormal gait, Decreased balance, Decreased mobility, Impaired sensation, Increased edema  Visit Diagnosis: Difficulty in walking, not elsewhere classified  Muscle weakness (generalized)  History of falling     Problem List There are no problems to display for this patient.  Durwin Reges DPT Durwin Reges, PT 12/30/2020, 4:47 PM  [facility] [address], [place], [address] Phone: [phone]   Fax:  [phone]  Name: MADDIX KLIEWER MRN: [id number] Date of Birth: Mar 09, 1971

## 2021-01-04 ENCOUNTER — Encounter: Payer: Self-pay | Admitting: Physical Therapy

## 2021-01-04 ENCOUNTER — Ambulatory Visit: Payer: BC Managed Care – PPO | Admitting: Physical Therapy

## 2021-01-04 DIAGNOSIS — R262 Difficulty in walking, not elsewhere classified: Secondary | ICD-10-CM

## 2021-01-04 NOTE — Therapy (Signed)
[facility] [address], [place], [address] Phone: [phone]   Fax:  [phone]  Physical Therapy Treatment  Patient Details  Name: Michelle Ruiz MRN: [id number] Date of Birth: May 04, 1970 No data recorded  Encounter Date: 01/04/2021   PT End of Session - 01/04/21 1649     Visit Number 145    Number of Visits 200    Date for PT Re-Evaluation 12/14/20    Authorization - Visit Number 5    Progress Note Due on Visit 10    PT Start Time 3419    PT Stop Time 1724    PT Time Calculation (min) 38 min             Past Medical History:  Diagnosis Date   Abnormal Pap smear of cervix    Actinic keratosis    Anxiety    Basal cell carcinoma 04/14/2008   Right upper ant. thigh. BCC with sclerosis.   Basal cell carcinoma 08/14/2008   Right lat. lower thigh. Superficial.    Basal cell carcinoma 04/29/2019   Right mid forearm. Superficial and nodular patterns. EDC.   Basal cell carcinoma 08/17/2020   Right upper antecubital. EDC.   Kidney stones    MS (multiple sclerosis) ([place])    UTI (urinary tract infection)     Past Surgical History:  Procedure Laterality Date   BREAST BIOPSY Right 2010   stereotactic biopsy/ neg/ dr.byrnett   BREAST BIOPSY Left 2012   neg/dr. brynett   CES[facility]AN SECTION  2008   [facility]   TONSILLECTOMY  2006    There were no vitals filed for this visit.   Subjective Assessment - 01/04/21 1702     Subjective Patient reports no other changes, pain or falls to report    Patient is accompained by: Family member    Pertinent History Patient reports history of falls x3 in the past 6 months secondary to drop foot on the L LE. PMH: Multiple sclerosis; elbow fracture of the R UE    Limitations Standing;Walking;Lifting    How long can you stand comfortably? 20 min    How long can you walk comfortably? 1 miles    Patient Stated Goals To be more balanced and core strengthening              Therex    Leg swings Flex/ABD 1 x 10 reps each LE  Surrender squats 1 x 5 reps on R LE; patient had difficulty maintain balance and required BUE support on last 2 reps   Treadmill pushes 3 x 60 sec with rest breaks between with emphasis endurance toe off strength   TRX Hip ABD slider 2 x 10 slides each LE    Pt given adequate rest breaks between sets            PT Education - 01/04/21 1649     Education Details therex form/technique    Person(s) Educated Patient    Methods Explanation;Demonstration    Comprehension Verbalized understanding;Returned demonstration                 PT Long Term Goals - 08/05/20 1740       PT LONG TERM GOAL #1   Title Patient will be independent with balance HEP to continue benefits of therapy after discharge.    Baseline dependent with form/technique for balance exercise; 03/25/2018: Independent     Time 6    Period Weeks  Status Achieved      PT LONG TERM GOAL #2   Title Patient will improve FGA by 4 points to indicate significant improvement with balancing while ambulating and decrease of fall risk    Baseline 03/25/2018: 28/30; 04/10/2019: 22; 07/21/2019: 29; 09/17/2019: 30/30    Time 6    Period Weeks    Status Achieved      PT LONG TERM GOAL #3   Title Patient will be able to perform prolonged single leg stance >10sec with eyes closed to indicate improvement with static balance and decrease fall risk    Baseline 2 sec in standing EC; 03/25/2018: 3 sec; 04/29/2018: 4 sec; 08/21/2018: 5 sec; 09/19/2018: 6 sec ; 10/21/2018 3 sec; 11/21/2018 5 sec; 01/08/2019: 9 sec; 02/25/2019: 14sec    Time 4    Period Weeks    Status Achieved      PT LONG TERM GOAL #4   Title Pt will improve 6 MWT >2066ft to demonstrate improved cardiorespiratory fitness    Baseline Deferred to next session: 04/29/2018; 05/01/2018: 1412ft; 08/21/2018: 1462ft; 09/19/2018: 1456ft; 10/21/2018 1621; 11/21/2018 deferred to next session 2/2 time.; 01/08/2019:  1480 ft; 02/25/2019: 1371ft; 04/10/2019: 1400 ft, 05/13/19: 1321 ft; 07/21/2019: 1170 ft; 09/17/2019: 1264ft; 11/17/2019: 1235ft; 01/13/20: 5885; 03/15/20: 1237ft; 06/17/2020: 1279ft    Time 4    Period Weeks    Status Deferred      PT LONG TERM GOAL #5   Title Patient will be able to run 215ft without stopping to better be able to return to recreational activites and address cardiovascular endurance.    Baseline 15ft; 02/25/2019: 14ft; 04/10/2019 deferred    Time 4    Period Weeks    Status Deferred      PT LONG TERM GOAL #6   Title Patient will improve L hip flexion strength to 4/5 for improved steppage gait and reduced fall risk.    Baseline 04/10/2019: L hip flexion 3+/5; 07/21/2019: L Hip 4/5    Time 6    Period Weeks    Status Achieved      PT LONG TERM GOAL #7   Title Patient will be able to lift her affected L LE to the same extent as the R LE to allow for greater amount of foot clearance when ambulating    Baseline L LE: 10", R LE: 15"; 11/17/2019: L LE: 14", R LE 17.5"; 01/13/2020: L LE: 15.5", R LE 18"; 12/20: 18" B    Time 8    Period Weeks    Status Achieved      PT LONG TERM GOAL #8   Title Patient will have full dorsiflexion with 5/5 strength to allow for greater toe clearance with the performance of exercises, most notably when walking.    Baseline L 4/5 5 degree ; R 5/5 12 degree; L 4+/5 5 degree ; R 5/5 12 degree    Time 8    Period Weeks    Status Achieved      PT LONG TERM GOAL  #9   TITLE Patient will have no scuffing along L LE with performance of 6 min WT to indicate    Baseline 06/17/2020: No scuffing during today    Time 6    Period Weeks    Status Achieved      PT LONG TERM GOAL  #10   TITLE Patient will improve her ABC by 13% to indicate significant improvement in confidence with her balance    Baseline 56.25%; 08/05/20 86.2%  Time 6    Period Weeks    Status Achieved      PT LONG TERM GOAL  #11   TITLE Patient will improve FGA by 3 points to indicate  significant improvement in fucntioncal gait and balance with walking.    Baseline 08/05/20 22/30    Time 6    Period Weeks    Status On-going                   Plan - 01/04/21 1715     Clinical Impression Statement PT continued LE strengthening and dynamic balance progression this session.  Pt continues to display decreased balance, strength, and activity tolerance evidenced by need for frequent rest breaks. Pt requires supervision/CGA for occasional loss of balance when tranfering from tall kneeling (floor)>stand. Patient will continue to benefit from skilled therapy for exercise performance to ensure proper technique for safe and effective progression.    Personal Factors and Comorbidities Past/Current Experience    Examination-Activity Limitations Locomotion Level    Examination-Participation Restrictions Community Activity    Stability/Clinical Decision Making Evolving/Moderate complexity    Clinical Decision Making Moderate    Rehab Potential Good    Clinical Impairments Affecting Rehab Potential (-) hx of MS; (+) good motivation    PT Frequency 2x / week    PT Duration 12 weeks    PT Treatment/Interventions Moist Heat;Iontophoresis 4mg /ml Dexamethasone;Electrical Stimulation;Aquatic Therapy;Cryotherapy;Therapeutic activities;Neuromuscular re-education;Therapeutic exercise;Patient/family education;Dry needling;Manual techniques;Passive range of motion;Balance training;Functional mobility training;Gait training;Stair training;Joint Manipulations;Prosthetic Training    PT Next Visit Plan Dynamic balance and LE strength exercise    PT Home Exercise Plan hip flexion in sitting for strengthening, hip flexion knee flexion in standing for balance, bike intervals at 70%-75% max HR    Consulted and Agree with Plan of Care Patient             Patient will benefit from skilled therapeutic intervention in order to improve the following deficits and impairments:  Pain, Decreased  coordination, Impaired perceived functional ability, Increased fascial restricitons, Increased muscle spasms, Difficulty walking, Abnormal gait, Decreased balance, Decreased mobility, Impaired sensation, Increased edema  Visit Diagnosis: Difficulty in walking, not elsewhere classified     Problem List There are no problems to display for this patient.   Durwin Reges DPT Sharion Settler, SPT  Sharion Settler, Student-PT 01/04/2021, 5:35 PM  [facility] [address], [place], [address] Phone: [phone]   Fax:  [phone]  Name: Michelle Ruiz MRN: [id number] Date of Birth: 1970/09/19

## 2021-01-11 ENCOUNTER — Ambulatory Visit: Payer: BC Managed Care – PPO | Admitting: Physical Therapy

## 2021-01-11 ENCOUNTER — Encounter: Payer: Self-pay | Admitting: Physical Therapy

## 2021-01-11 DIAGNOSIS — R262 Difficulty in walking, not elsewhere classified: Secondary | ICD-10-CM

## 2021-01-11 NOTE — Therapy (Signed)
[facility] [address], [place], [address] Phone: [phone]   Fax:  [phone]  Physical Therapy Treatment  Patient Details  Name: Michelle Ruiz MRN: [id number] Date of Birth: 03-10-1971 No data recorded  Encounter Date: 01/11/2021   PT End of Session - 01/11/21 1741     Visit Number 146    Number of Visits 200    Authorization - Visit Number 6    Progress Note Due on Visit 10    PT Start Time 0350    PT Stop Time 1727    PT Time Calculation (min) 40 min    Equipment Utilized During Treatment Gait belt    Activity Tolerance Patient tolerated treatment well    Behavior During Therapy WFL for tasks assessed/performed             Past Medical History:  Diagnosis Date   Abnormal Pap smear of cervix    Actinic keratosis    Anxiety    Basal cell carcinoma 04/14/2008   Right upper ant. thigh. BCC with sclerosis.   Basal cell carcinoma 08/14/2008   Right lat. lower thigh. Superficial.    Basal cell carcinoma 04/29/2019   Right mid forearm. Superficial and nodular patterns. EDC.   Basal cell carcinoma 08/17/2020   Right upper antecubital. EDC.   Kidney stones    MS (multiple sclerosis) ([place])    UTI (urinary tract infection)     Past Surgical History:  Procedure Laterality Date   BREAST BIOPSY Right 2010   stereotactic biopsy/ neg/ dr.byrnett   BREAST BIOPSY Left 2012   neg/dr. brynett   CESAREAN SECTION  2008   [facility]   TONSILLECTOMY  2006    There were no vitals filed for this visit.   Subjective Assessment - 01/11/21 1651     Subjective Patient reports no other changes, pain or falls to report    Pertinent History Patient reports history of falls x3 in the past 6 months secondary to drop foot on the L LE. PMH: Multiple sclerosis; elbow fracture of the R UE    Limitations Standing;Walking;Lifting    How long can you stand comfortably? 20 min    How long can you walk comfortably? 1  miles    Patient Stated Goals To be more balanced and core strengthening              Therex     Leg swings Flex/ABD 1 x 10 reps each LE  Treadmill pushes 3 x 60 sec with rest breaks between with emphasis endurance toe off strength   Half Kneeling<>Standing from yellow/red mat 1 x 6 reps each LE with from single to BUE use at reps (4-6)   Side lounges onto dynadisc 2 x 8 reps each LE   Standing Quadriceps stretch in chair 3 x 10 sec each LE    Pt given rest breaks between sets for adequate recovery                   PT Education - 01/11/21 1741     Education Details therex form/technique    Person(s) Educated Patient    Methods Explanation;Demonstration    Comprehension Verbalized understanding;Returned demonstration                 PT Long Term Goals - 08/05/20 1740       PT LONG TERM GOAL #1   Title Patient will be independent with balance HEP  to continue benefits of therapy after discharge.    Baseline dependent with form/technique for balance exercise; 03/25/2018: Independent     Time 6    Period Weeks    Status Achieved      PT LONG TERM GOAL #2   Title Patient will improve FGA by 4 points to indicate significant improvement with balancing while ambulating and decrease of fall risk    Baseline 03/25/2018: 28/30; 04/10/2019: 22; 07/21/2019: 29; 09/17/2019: 30/30    Time 6    Period Weeks    Status Achieved      PT LONG TERM GOAL #3   Title Patient will be able to perform prolonged single leg stance >10sec with eyes closed to indicate improvement with static balance and decrease fall risk    Baseline 2 sec in standing EC; 03/25/2018: 3 sec; 04/29/2018: 4 sec; 08/21/2018: 5 sec; 09/19/2018: 6 sec ; 10/21/2018 3 sec; 11/21/2018 5 sec; 01/08/2019: 9 sec; 02/25/2019: 14sec    Time 4    Period Weeks    Status Achieved      PT LONG TERM GOAL #4   Title Pt will improve 6 MWT >2054ft to demonstrate improved cardiorespiratory fitness    Baseline  Deferred to next session: 04/29/2018; 05/01/2018: 1430ft; 08/21/2018: 1429ft; 09/19/2018: 1444ft; 10/21/2018 1621; 11/21/2018 deferred to next session 2/2 time.; 01/08/2019: 1480 ft; 02/25/2019: 1396ft; 04/10/2019: 1400 ft, 05/13/19: 1321 ft; 07/21/2019: 1170 ft; 09/17/2019: 1224ft; 11/17/2019: 1255ft; 01/13/20: 9379; 03/15/20: 1235ft; 06/17/2020: 1259ft    Time 4    Period Weeks    Status Deferred      PT LONG TERM GOAL #5   Title Patient will be able to run 257ft without stopping to better be able to return to recreational activites and address cardiovascular endurance.    Baseline 81ft; 02/25/2019: 45ft; 04/10/2019 deferred    Time 4    Period Weeks    Status Deferred      PT LONG TERM GOAL #6   Title Patient will improve L hip flexion strength to 4/5 for improved steppage gait and reduced fall risk.    Baseline 04/10/2019: L hip flexion 3+/5; 07/21/2019: L Hip 4/5    Time 6    Period Weeks    Status Achieved      PT LONG TERM GOAL #7   Title Patient will be able to lift her affected L LE to the same extent as the R LE to allow for greater amount of foot clearance when ambulating    Baseline L LE: 10", R LE: 15"; 11/17/2019: L LE: 14", R LE 17.5"; 01/13/2020: L LE: 15.5", R LE 18"; 12/20: 18" B    Time 8    Period Weeks    Status Achieved      PT LONG TERM GOAL #8   Title Patient will have full dorsiflexion with 5/5 strength to allow for greater toe clearance with the performance of exercises, most notably when walking.    Baseline L 4/5 5 degree ; R 5/5 12 degree; L 4+/5 5 degree ; R 5/5 12 degree    Time 8    Period Weeks    Status Achieved      PT LONG TERM GOAL  #9   TITLE Patient will have no scuffing along L LE with performance of 6 min WT to indicate    Baseline 06/17/2020: No scuffing during today    Time 6    Period Weeks    Status Achieved  PT LONG TERM GOAL  #10   TITLE Patient will improve her ABC by 13% to indicate significant improvement in confidence with her balance     Baseline 56.25%; 08/05/20 86.2%    Time 6    Period Weeks    Status Achieved      PT LONG TERM GOAL  #11   TITLE Patient will improve FGA by 3 points to indicate significant improvement in fucntioncal gait and balance with walking.    Baseline 08/05/20 22/30    Time 6    Period Weeks    Status On-going                   Plan - 01/11/21 1742     Clinical Impression Statement PT continued therex progression for dynamic balance and endurance this session with success. Patient tolerated session well with no reports of pain in ankle. PT increased rest breaks to allow for increased recovery and modified half kneeling therex to accomodate patient success. Patient is able to complete all therex with min cuing for proper technique of therex. PT will continue progression as able.    Personal Factors and Comorbidities Past/Current Experience    Examination-Activity Limitations Locomotion Level    Examination-Participation Restrictions Community Activity    Stability/Clinical Decision Making Evolving/Moderate complexity    Clinical Decision Making Moderate    Rehab Potential Good    PT Frequency 2x / week    PT Duration 12 weeks    PT Treatment/Interventions Moist Heat;Iontophoresis 4mg /ml Dexamethasone;Electrical Stimulation;Aquatic Therapy;Cryotherapy;Therapeutic activities;Neuromuscular re-education;Therapeutic exercise;Patient/family education;Dry needling;Manual techniques;Passive range of motion;Balance training;Functional mobility training;Gait training;Stair training;Joint Manipulations;Prosthetic Training    PT Next Visit Plan Dynamic balance and LE strength exercise    PT Home Exercise Plan hip flexion in sitting for strengthening, hip flexion knee flexion in standing for balance, bike intervals at 70%-75% max HR    Consulted and Agree with Plan of Care Patient             Patient will benefit from skilled therapeutic intervention in order to improve the following deficits  and impairments:  Pain, Decreased coordination, Impaired perceived functional ability, Increased fascial restricitons, Increased muscle spasms, Difficulty walking, Abnormal gait, Decreased balance, Decreased mobility, Impaired sensation, Increased edema  Visit Diagnosis: Difficulty in walking, not elsewhere classified     Problem List There are no problems to display for this patient.   Durwin Reges DPT Sharion Settler, SPT  Durwin Reges, PT 01/12/2021, 11:02 AM  [facility] [address] [place], [address] Phone: [phone]   Fax:  [phone]  Name: Michelle Ruiz MRN: [id number] Date of Birth: 1970-10-31

## 2021-01-18 ENCOUNTER — Ambulatory Visit: Payer: BC Managed Care – PPO | Admitting: Physical Therapy

## 2021-01-18 ENCOUNTER — Encounter: Payer: Self-pay | Admitting: Physical Therapy

## 2021-01-18 DIAGNOSIS — R262 Difficulty in walking, not elsewhere classified: Secondary | ICD-10-CM | POA: Diagnosis not present

## 2021-01-18 NOTE — Therapy (Signed)
[facility] [address], [place], [address] Phone: [phone]   Fax:  [phone]  Physical Therapy Treatment  Patient Details  Name: Michelle Ruiz MRN: [id number] Date of Birth: 07-22-1970 No data recorded  Encounter Date: 01/18/2021   PT End of Session - 01/18/21 1647     Visit Number 147    Number of Visits 200    Date for PT Re-Evaluation 12/14/20    Authorization - Visit Number 7    Progress Note Due on Visit 10    PT Start Time 1645    PT Stop Time 1725    PT Time Calculation (min) 40 min    Equipment Utilized During Treatment Gait belt    Activity Tolerance Patient tolerated treatment well    Behavior During Therapy WFL for tasks assessed/performed             Past Medical History:  Diagnosis Date   Abnormal Pap smear of cervix    Actinic keratosis    Anxiety    Basal cell carcinoma 04/14/2008   Right upper ant. thigh. BCC with sclerosis.   Basal cell carcinoma 08/14/2008   Right lat. lower thigh. Superficial.    Basal cell carcinoma 04/29/2019   Right mid forearm. Superficial and nodular patterns. EDC.   Basal cell carcinoma 08/17/2020   Right upper antecubital. EDC.   Kidney stones    MS (multiple sclerosis) ([place])    UTI (urinary tract infection)     Past Surgical History:  Procedure Laterality Date   BREAST BIOPSY Right 2010   stereotactic biopsy/ neg/ dr.byrnett   BREAST BIOPSY Left 2012   neg/dr. brynett   CESAREAN SECTION  2008   [facility]   TONSILLECTOMY  2006    There were no vitals filed for this visit.   Subjective Assessment - 01/18/21 1648     Subjective Patient reports increased soreness in R>L LE following last session. She otherwise reports no changes in status.    Patient is accompained by: Family member    Pertinent History Patient reports history of falls x3 in the past 6 months secondary to drop foot on the L LE. PMH: Multiple sclerosis; elbow fracture of  the R UE    Limitations Standing;Walking;Lifting    How long can you stand comfortably? 20 min    How long can you walk comfortably? 1 miles    Patient Stated Goals To be more balanced and core strengthening             Therex     Leg swings Flex/ABD 1 x 10 reps each LE   Single leg Treadmill pushes 2 x 8-10 sec with rest breaks between with emphasis on heel striking and endurance with toe off strength    FWD Lunges onto bosu ball 2 x 10 reps each LE with single UE support    Standing Quadriceps stretch in chair 1 x 30 sec each LE     Pt given rest breaks between sets for adequate recovery                            PT Education - 01/18/21 1647     Education Details therex form/technique    Person(s) Educated Patient    Methods Explanation;Demonstration    Comprehension Verbalized understanding;Returned demonstration                 PT Long  Term Goals - 08/05/20 1740       PT LONG TERM GOAL #1   Title Patient will be independent with balance HEP to continue benefits of therapy after discharge.    Baseline dependent with form/technique for balance exercise; 03/25/2018: Independent     Time 6    Period Weeks    Status Achieved      PT LONG TERM GOAL #2   Title Patient will improve FGA by 4 points to indicate significant improvement with balancing while ambulating and decrease of fall risk    Baseline 03/25/2018: 28/30; 04/10/2019: 22; 07/21/2019: 29; 09/17/2019: 30/30    Time 6    Period Weeks    Status Achieved      PT LONG TERM GOAL #3   Title Patient will be able to perform prolonged single leg stance >10sec with eyes closed to indicate improvement with static balance and decrease fall risk    Baseline 2 sec in standing EC; 03/25/2018: 3 sec; 04/29/2018: 4 sec; 08/21/2018: 5 sec; 09/19/2018: 6 sec ; 10/21/2018 3 sec; 11/21/2018 5 sec; 01/08/2019: 9 sec; 02/25/2019: 14sec    Time 4    Period Weeks    Status Achieved      PT LONG TERM  GOAL #4   Title Pt will improve 6 MWT >2038ft to demonstrate improved cardiorespiratory fitness    Baseline Deferred to next session: 04/29/2018; 05/01/2018: 1473ft; 08/21/2018: 1442ft; 09/19/2018: 1447ft; 10/21/2018 1621; 11/21/2018 deferred to next session 2/2 time.; 01/08/2019: 1480 ft; 02/25/2019: 1369ft; 04/10/2019: 1400 ft, 05/13/19: 1321 ft; 07/21/2019: 1170 ft; 09/17/2019: 12105ft; 11/17/2019: 1223ft; 01/13/20: 4403; 03/15/20: 1236ft; 06/17/2020: 1274ft    Time 4    Period Weeks    Status Deferred      PT LONG TERM GOAL #5   Title Patient will be able to run 275ft without stopping to better be able to return to recreational activites and address cardiovascular endurance.    Baseline 6ft; 02/25/2019: 25ft; 04/10/2019 deferred    Time 4    Period Weeks    Status Deferred      PT LONG TERM GOAL #6   Title Patient will improve L hip flexion strength to 4/5 for improved steppage gait and reduced fall risk.    Baseline 04/10/2019: L hip flexion 3+/5; 07/21/2019: L Hip 4/5    Time 6    Period Weeks    Status Achieved      PT LONG TERM GOAL #7   Title Patient will be able to lift her affected L LE to the same extent as the R LE to allow for greater amount of foot clearance when ambulating    Baseline L LE: 10", R LE: 15"; 11/17/2019: L LE: 14", R LE 17.5"; 01/13/2020: L LE: 15.5", R LE 18"; 12/20: 18" B    Time 8    Period Weeks    Status Achieved      PT LONG TERM GOAL #8   Title Patient will have full dorsiflexion with 5/5 strength to allow for greater toe clearance with the performance of exercises, most notably when walking.    Baseline L 4/5 5 degree ; R 5/5 12 degree; L 4+/5 5 degree ; R 5/5 12 degree    Time 8    Period Weeks    Status Achieved      PT LONG TERM GOAL  #9   TITLE Patient will have no scuffing along L LE with performance of 6 min WT to indicate  Baseline 06/17/2020: No scuffing during today    Time 6    Period Weeks    Status Achieved      PT LONG TERM GOAL  #10    TITLE Patient will improve her ABC by 13% to indicate significant improvement in confidence with her balance    Baseline 56.25%; 08/05/20 86.2%    Time 6    Period Weeks    Status Achieved      PT LONG TERM GOAL  #11   TITLE Patient will improve FGA by 3 points to indicate significant improvement in fucntioncal gait and balance with walking.    Baseline 08/05/20 22/30    Time 6    Period Weeks    Status On-going                   Plan - 01/18/21 1729     Clinical Impression Statement PT continued therex progression for dynamic balance and endurance this session with success. Patient tolerated session well but had reports of triceps pain with pushing up to stand. PT increased rest breaks to allow for adequate recovery for patient success. Patient is able to complete all therex with min cuing for proper technique of therex. PT will continue progression as able.    Personal Factors and Comorbidities Past/Current Experience    Examination-Activity Limitations Locomotion Level    Examination-Participation Restrictions Community Activity    Stability/Clinical Decision Making Evolving/Moderate complexity    Clinical Decision Making Moderate    Rehab Potential Good    PT Frequency 2x / week    PT Duration 12 weeks    PT Treatment/Interventions Moist Heat;Iontophoresis 4mg /ml Dexamethasone;Electrical Stimulation;Aquatic Therapy;Cryotherapy;Therapeutic activities;Neuromuscular re-education;Therapeutic exercise;Patient/family education;Dry needling;Manual techniques;Passive range of motion;Balance training;Functional mobility training;Gait training;Stair training;Joint Manipulations;Prosthetic Training    PT Next Visit Plan Dynamic balance and LE strength exercise    PT Home Exercise Plan hip flexion in sitting for strengthening, hip flexion knee flexion in standing for balance, bike intervals at 70%-75% max HR             Patient will benefit from skilled therapeutic intervention in  order to improve the following deficits and impairments:  Pain, Decreased coordination, Impaired perceived functional ability, Increased fascial restricitons, Increased muscle spasms, Difficulty walking, Abnormal gait, Decreased balance, Decreased mobility, Impaired sensation, Increased edema  Visit Diagnosis: Difficulty in walking, not elsewhere classified     Problem List There are no problems to display for this patient.   Durwin Reges DPT Sharion Settler, SPT  Durwin Reges, PT 01/19/2021, 12:40 PM  [facility] [address], [place], [address] Phone: [phone]   Fax:  [phone]  Name: Michelle Ruiz MRN: [id number] Date of Birth: 24-Dec-1970

## 2021-01-26 ENCOUNTER — Encounter: Payer: Self-pay | Admitting: Physical Therapy

## 2021-01-26 ENCOUNTER — Ambulatory Visit: Payer: BC Managed Care – PPO | Attending: Neurology | Admitting: Physical Therapy

## 2021-01-26 DIAGNOSIS — M6281 Muscle weakness (generalized): Secondary | ICD-10-CM | POA: Insufficient documentation

## 2021-01-26 DIAGNOSIS — R262 Difficulty in walking, not elsewhere classified: Secondary | ICD-10-CM | POA: Diagnosis present

## 2021-01-26 NOTE — Therapy (Signed)
[facility] [address] [place], [address] Phone: [phone]   Fax:  [phone]  Physical Therapy Treatment  Patient Details  Name: Michelle Ruiz MRN: [id number] Date of Birth: Jan 23, 1971 No data recorded  Encounter Date: 01/26/2021   PT End of Session - 01/26/21 1653     Visit Number 148    Number of Visits 200    Date for PT Re-Evaluation 12/14/20    Authorization - Visit Number 8    Progress Note Due on Visit 10    PT Start Time 1602    PT Stop Time 1645    PT Time Calculation (min) 43 min    Equipment Utilized During Treatment Gait belt    Activity Tolerance Patient tolerated treatment well    Behavior During Therapy WFL for tasks assessed/performed             Past Medical History:  Diagnosis Date   Abnormal Pap smear of cervix    Actinic keratosis    Anxiety    Basal cell carcinoma 04/14/2008   Right upper ant. thigh. BCC with sclerosis.   Basal cell carcinoma 08/14/2008   Right lat. lower thigh. Superficial.    Basal cell carcinoma 04/29/2019   Right mid forearm. Superficial and nodular patterns. EDC.   Basal cell carcinoma 08/17/2020   Right upper antecubital. EDC.   Kidney stones    MS (multiple sclerosis) ([place])    UTI (urinary tract infection)     Past Surgical History:  Procedure Laterality Date   BREAST BIOPSY Right 2010   stereotactic biopsy/ neg/ dr.byrnett   BREAST BIOPSY Left 2012   neg/dr. brynett   [facility]AN SECTION  2008   [facility]   TONSILLECTOMY  2006    There were no vitals filed for this visit.   Subjective Assessment - 01/26/21 1603     Subjective Patient reports no changes in medical status.  She reports feeling well this visit.    Patient is accompained by: Family member    Pertinent History Patient reports history of falls x3 in the past 6 months secondary to drop foot on the L LE. PMH: Multiple sclerosis; elbow fracture of the R UE    Limitations  Standing;Walking;Lifting    How long can you stand comfortably? 20 min    How long can you walk comfortably? 1 miles    Patient Stated Goals To be more balanced and core strengthening              Therex   Squats with swiss ball on wall RTB above knee 2 x 10 reps with cues to decreases knee valgus  TRX Hip EXT slider 2 x 5 slides each LE   Lateral Treadmill pushes 3 x 10 pushes BLE with single UE support  Heel raises on Bosu Ball 2 x 10 reps with BUE support    * Pt given adequate rest breaks between sets             PT Long Term Goals - 08/05/20 1740       PT LONG TERM GOAL #1   Title Patient will be independent with balance HEP to continue benefits of therapy after discharge.    Baseline dependent with form/technique for balance exercise; 03/25/2018: Independent     Time 6    Period Weeks    Status Achieved      PT LONG TERM GOAL #2   Title Patient will improve  FGA by 4 points to indicate significant improvement with balancing while ambulating and decrease of fall risk    Baseline 03/25/2018: 28/30; 04/10/2019: 22; 07/21/2019: 29; 09/17/2019: 30/30    Time 6    Period Weeks    Status Achieved      PT LONG TERM GOAL #3   Title Patient will be able to perform prolonged single leg stance >10sec with eyes closed to indicate improvement with static balance and decrease fall risk    Baseline 2 sec in standing EC; 03/25/2018: 3 sec; 04/29/2018: 4 sec; 08/21/2018: 5 sec; 09/19/2018: 6 sec ; 10/21/2018 3 sec; 11/21/2018 5 sec; 01/08/2019: 9 sec; 02/25/2019: 14sec    Time 4    Period Weeks    Status Achieved      PT LONG TERM GOAL #4   Title Pt will improve 6 MWT >2051ft to demonstrate improved cardiorespiratory fitness    Baseline Deferred to next session: 04/29/2018; 05/01/2018: 1421ft; 08/21/2018: 145ft; 09/19/2018: 1474ft; 10/21/2018 1621; 11/21/2018 deferred to next session 2/2 time.; 01/08/2019: 1480 ft; 02/25/2019: 1364ft; 04/10/2019: 1400 ft, 05/13/19: 1321 ft; 07/21/2019:  1170 ft; 09/17/2019: 1271ft; 11/17/2019: 1245ft; 01/13/20: 1353; 03/15/20: 1282ft; 06/17/2020: 1271ft    Time 4    Period Weeks    Status Deferred      PT LONG TERM GOAL #5   Title Patient will be able to run 235ft without stopping to better be able to return to recreational activites and address cardiovascular endurance.    Baseline 48ft; 02/25/2019: 54ft; 04/10/2019 deferred    Time 4    Period Weeks    Status Deferred      PT LONG TERM GOAL #6   Title Patient will improve L hip flexion strength to 4/5 for improved steppage gait and reduced fall risk.    Baseline 04/10/2019: L hip flexion 3+/5; 07/21/2019: L Hip 4/5    Time 6    Period Weeks    Status Achieved      PT LONG TERM GOAL #7   Title Patient will be able to lift her affected L LE to the same extent as the R LE to allow for greater amount of foot clearance when ambulating    Baseline L LE: 10", R LE: 15"; 11/17/2019: L LE: 14", R LE 17.5"; 01/13/2020: L LE: 15.5", R LE 18"; 12/20: 18" B    Time 8    Period Weeks    Status Achieved      PT LONG TERM GOAL #8   Title Patient will have full dorsiflexion with 5/5 strength to allow for greater toe clearance with the performance of exercises, most notably when walking.    Baseline L 4/5 5 degree ; R 5/5 12 degree; L 4+/5 5 degree ; R 5/5 12 degree    Time 8    Period Weeks    Status Achieved      PT LONG TERM GOAL  #9   TITLE Patient will have no scuffing along L LE with performance of 6 min WT to indicate    Baseline 06/17/2020: No scuffing during today    Time 6    Period Weeks    Status Achieved      PT LONG TERM GOAL  #10   TITLE Patient will improve her ABC by 13% to indicate significant improvement in confidence with her balance    Baseline 56.25%; 08/05/20 86.2%    Time 6    Period Weeks    Status Achieved  PT LONG TERM GOAL  #11   TITLE Patient will improve FGA by 3 points to indicate significant improvement in fucntioncal gait and balance with walking.     Baseline 08/05/20 22/30    Time 6    Period Weeks    Status On-going                   Plan - 01/26/21 1648     Clinical Impression Statement PT continued LE strengthening and dynamic balance progression this session.  Pt continues to display decreased balance, strength, and activity tolerance evidenced by need for frequent rest breaks. Patient will continue to benefit from skilled therapy for exercise performance to ensure proper technique for safe and effective progression.    Personal Factors and Comorbidities Past/Current Experience    Examination-Activity Limitations Locomotion Level    Examination-Participation Restrictions Community Activity    Stability/Clinical Decision Making Evolving/Moderate complexity    Clinical Decision Making Moderate    Rehab Potential Good    Clinical Impairments Affecting Rehab Potential (-) hx of MS; (+) good motivation    PT Frequency 2x / week    PT Duration 12 weeks    PT Treatment/Interventions Moist Heat;Iontophoresis 4mg /ml Dexamethasone;Electrical Stimulation;Aquatic Therapy;Cryotherapy;Therapeutic activities;Neuromuscular re-education;Therapeutic exercise;Patient/family education;Dry needling;Manual techniques;Passive range of motion;Balance training;Functional mobility training;Gait training;Stair training;Joint Manipulations;Prosthetic Training    PT Next Visit Plan Dynamic balance and LE strength exercise    PT Home Exercise Plan hip flexion in sitting for strengthening, hip flexion knee flexion in standing for balance, bike intervals at 70%-75% max HR    Consulted and Agree with Plan of Care Patient             Patient will benefit from skilled therapeutic intervention in order to improve the following deficits and impairments:  Pain, Decreased coordination, Impaired perceived functional ability, Increased fascial restricitons, Increased muscle spasms, Difficulty walking, Abnormal gait, Decreased balance, Decreased mobility,  Impaired sensation, Increased edema  Visit Diagnosis: Difficulty in walking, not elsewhere classified     Problem List There are no problems to display for this patient.    Durwin Reges DPT Sharion Settler, SPT  Durwin Reges, PT 01/27/2021, 1:44 PM  [facility] [address], [place], [address] Phone: [phone]   Fax:  [phone]  Name: Michelle Ruiz MRN: [id number] Date of Birth: 09/29/1970

## 2021-02-02 ENCOUNTER — Encounter: Payer: Self-pay | Admitting: Physical Therapy

## 2021-02-02 ENCOUNTER — Ambulatory Visit: Payer: BC Managed Care – PPO | Admitting: Physical Therapy

## 2021-02-02 DIAGNOSIS — R262 Difficulty in walking, not elsewhere classified: Secondary | ICD-10-CM

## 2021-02-02 NOTE — Therapy (Signed)
[facility] [address], [place], [address] Phone: [phone]   Fax:  [phone]  Physical Therapy Treatment  Patient Details  Name: Michelle Ruiz MRN: [id number] Date of Birth: 04/12/1970 No data recorded  Encounter Date: 02/02/2021   PT End of Session - 02/02/21 1603     Visit Number 149    Number of Visits 200    Date for PT Re-Evaluation 12/14/20    Authorization - Visit Number 9    Progress Note Due on Visit 10    PT Start Time 1601    PT Stop Time 1640    PT Time Calculation (min) 39 min    Equipment Utilized During Treatment Gait belt    Activity Tolerance Patient tolerated treatment well    Behavior During Therapy WFL for tasks assessed/performed             Past Medical History:  Diagnosis Date   Abnormal Pap smear of cervix    Actinic keratosis    Anxiety    Basal cell carcinoma 04/14/2008   Right upper ant. thigh. BCC with sclerosis.   Basal cell carcinoma 08/14/2008   Right lat. lower thigh. Superficial.    Basal cell carcinoma 04/29/2019   Right mid forearm. Superficial and nodular patterns. EDC.   Basal cell carcinoma 08/17/2020   Right upper antecubital. EDC.   Kidney stones    MS (multiple sclerosis) ([place])    UTI (urinary tract infection)     Past Surgical History:  Procedure Laterality Date   BREAST BIOPSY Right 2010   stereotactic biopsy/ neg/ dr.byrnett   BREAST BIOPSY Left 2012   neg/dr. brynett   [facility]AREAN SECTION  2008   [facility]   TONSILLECTOMY  2006    There were no vitals filed for this visit.   Subjective Assessment - 02/02/21 1604     Subjective Pt reports being anxious of walking in the community d/t fall risk. She asks about utilizing an assistiive device to help with balance.    Patient is accompained by: Family member    Pertinent History Patient reports history of falls x3 in the past 6 months secondary to drop foot on the L LE. PMH: Multiple  sclerosis; elbow fracture of the R UE    Limitations Standing;Walking;Lifting    How long can you stand comfortably? 20 min    How long can you walk comfortably? 1 miles    Patient Stated Goals To be more balanced and core strengthening                Therex     Leg swings Flex/ABD 1 x 10 reps each LE  Single Leg balance + rebounder x 10 throws each LE with CGA  Single leg balance 3 x 10 sec holds on each LE with CGA  Lateral Single leg treadmill pushes 1 x 10 reps each LE   *rest breaks given between sets     Therapeutic Activity  Ambulation on uneven surfaces, curbs, ramps approximately 500-800 ft with SPC with supervision-CGA with pertubations given to simulate instances of LOB and proper cane management with this. Patient reports walking with her cane, usually if she goes to lose her balance the cane "flies up", education on that the cane should be firmly pushed down to help her re-establish balance    Stair negotiation with SPC x 4  6" inch steps; CGA    *pt educated on proper SPC use with  negotiating level, unlevel surfaces, and stairs. Pt reported feeling safer and confident following end of session. Pt demonstrated good carry over and safety with tasks.              PT Education - 02/02/21 1649     Education Details gait and stair negotiation with [facility]    Person(s) Educated Patient    Methods Explanation;Demonstration    Comprehension Verbalized understanding;Returned demonstration                 PT Long Term Goals - 08/05/20 1740       PT LONG TERM GOAL #1   Title Patient will be independent with balance HEP to continue benefits of therapy after discharge.    Baseline dependent with form/technique for balance exercise; 03/25/2018: Independent     Time 6    Period Weeks    Status Achieved      PT LONG TERM GOAL #2   Title Patient will improve FGA by 4 points to indicate significant improvement with balancing while ambulating and  decrease of fall risk    Baseline 03/25/2018: 28/30; 04/10/2019: 22; 07/21/2019: 29; 09/17/2019: 30/30    Time 6    Period Weeks    Status Achieved      PT LONG TERM GOAL #3   Title Patient will be able to perform prolonged single leg stance >10sec with eyes closed to indicate improvement with static balance and decrease fall risk    Baseline 2 sec in standing EC; 03/25/2018: 3 sec; 04/29/2018: 4 sec; 08/21/2018: 5 sec; 09/19/2018: 6 sec ; 10/21/2018 3 sec; 11/21/2018 5 sec; 01/08/2019: 9 sec; 02/25/2019: 14sec    Time 4    Period Weeks    Status Achieved      PT LONG TERM GOAL #4   Title Pt will improve 6 MWT >2046ft to demonstrate improved cardiorespiratory fitness    Baseline Deferred to next session: 04/29/2018; 05/01/2018: 1429ft; 08/21/2018: 1447ft; 09/19/2018: 1431ft; 10/21/2018 1621; 11/21/2018 deferred to next session 2/2 time.; 01/08/2019: 1480 ft; 02/25/2019: 1359ft; 04/10/2019: 1400 ft, 05/13/19: 1321 ft; 07/21/2019: 1170 ft; 09/17/2019: 1256ft; 11/17/2019: 126ft; 01/13/20: 0981; 03/15/20: 1286ft; 06/17/2020: 1231ft    Time 4    Period Weeks    Status Deferred      PT LONG TERM GOAL #5   Title Patient will be able to run 291ft without stopping to better be able to return to recreational activites and address cardiovascular endurance.    Baseline 34ft; 02/25/2019: 32ft; 04/10/2019 deferred    Time 4    Period Weeks    Status Deferred      PT LONG TERM GOAL #6   Title Patient will improve L hip flexion strength to 4/5 for improved steppage gait and reduced fall risk.    Baseline 04/10/2019: L hip flexion 3+/5; 07/21/2019: L Hip 4/5    Time 6    Period Weeks    Status Achieved      PT LONG TERM GOAL #7   Title Patient will be able to lift her affected L LE to the same extent as the R LE to allow for greater amount of foot clearance when ambulating    Baseline L LE: 10", R LE: 15"; 11/17/2019: L LE: 14", R LE 17.5"; 01/13/2020: L LE: 15.5", R LE 18"; 12/20: 18" B    Time 8    Period Weeks     Status Achieved      PT LONG TERM GOAL #8   Title Patient  will have full dorsiflexion with 5/5 strength to allow for greater toe clearance with the performance of exercises, most notably when walking.    Baseline L 4/5 5 degree ; R 5/5 12 degree; L 4+/5 5 degree ; R 5/5 12 degree    Time 8    Period Weeks    Status Achieved      PT LONG TERM GOAL  #9   TITLE Patient will have no scuffing along L LE with performance of 6 min WT to indicate    Baseline 06/17/2020: No scuffing during today    Time 6    Period Weeks    Status Achieved      PT LONG TERM GOAL  #10   TITLE Patient will improve her ABC by 13% to indicate significant improvement in confidence with her balance    Baseline 56.25%; 08/05/20 86.2%    Time 6    Period Weeks    Status Achieved      PT LONG TERM GOAL  #11   TITLE Patient will improve FGA by 3 points to indicate significant improvement in fucntioncal gait and balance with walking.    Baseline 08/05/20 22/30    Time 6    Period Weeks    Status On-going                   Plan - 02/02/21 1654     Clinical Impression Statement PT instructed pt on proper SPC use on level surfaces, unlevel surfaces, and stairs negotiation. Pt reported feeling safer and confident following end of session. Pt demonstrated good carry over and safety with tasks. Pt continued with single leg static and dynamic balance with patient demonstrating increased difficulty L>R. Continue PT POC.    Personal Factors and Comorbidities Past/Current Experience    Examination-Activity Limitations Locomotion Level    Examination-Participation Restrictions Community Activity    Stability/Clinical Decision Making Evolving/Moderate complexity    Clinical Decision Making Moderate    Rehab Potential Good    Clinical Impairments Affecting Rehab Potential (-) hx of MS; (+) good motivation    PT Frequency 2x / week    PT Duration 12 weeks    PT Treatment/Interventions Moist Heat;Iontophoresis 4mg /ml  Dexamethasone;Electrical Stimulation;Aquatic Therapy;Cryotherapy;Therapeutic activities;Neuromuscular re-education;Therapeutic exercise;Patient/family education;Dry needling;Manual techniques;Passive range of motion;Balance training;Functional mobility training;Gait training;Stair training;Joint Manipulations;Prosthetic Training    PT Next Visit Plan Dynamic balance and LE strength exercise    PT Home Exercise Plan hip flexion in sitting for strengthening, hip flexion knee flexion in standing for balance, bike intervals at 70%-75% max HR    Consulted and Agree with Plan of Care Patient             Patient will benefit from skilled therapeutic intervention in order to improve the following deficits and impairments:  Pain, Decreased coordination, Impaired perceived functional ability, Increased fascial restricitons, Increased muscle spasms, Difficulty walking, Abnormal gait, Decreased balance, Decreased mobility, Impaired sensation, Increased edema  Visit Diagnosis: Difficulty in walking, not elsewhere classified     Problem List There are no problems to display for this patient.   Durwin Reges DPT Sharion Settler, SPT  Durwin Reges, PT 02/03/2021, 12:06 PM  [place] [address], [place], [address] Phone: [phone]   Fax:  [phone]  Name: Michelle Ruiz MRN: [id number] Date of Birth: Nov 14, 1970

## 2021-02-08 ENCOUNTER — Ambulatory Visit: Payer: BC Managed Care – PPO | Admitting: Physical Therapy

## 2021-02-08 ENCOUNTER — Encounter: Payer: Self-pay | Admitting: Physical Therapy

## 2021-02-08 DIAGNOSIS — R262 Difficulty in walking, not elsewhere classified: Secondary | ICD-10-CM

## 2021-02-08 NOTE — Therapy (Signed)
[place] [address] [place], [address] Phone: [phone]   Fax:  [phone]  Physical Therapy Treatment/Progress Note Reporting Period 11/23/20 - 02/08/21  Patient Details  Name: Michelle Ruiz MRN: [id number] Date of Birth: Nov 19, 1970 No data recorded  Encounter Date: 02/08/2021   PT End of Session - 02/08/21 1744     Visit Number 150    Number of Visits 200    Date for PT Re-Evaluation 03/04/21    Authorization - Visit Number 10    Progress Note Due on Visit 10    PT Start Time 9242    PT Stop Time 1726    PT Time Calculation (min) 39 min    Equipment Utilized During Treatment Gait belt    Activity Tolerance Patient tolerated treatment well    Behavior During Therapy [facility] for tasks assessed/performed             Past Medical History:  Diagnosis Date   Abnormal Pap smear of cervix    Actinic keratosis    Anxiety    Basal cell carcinoma 04/14/2008   Right upper ant. thigh. BCC with sclerosis.   Basal cell carcinoma 08/14/2008   Right lat. lower thigh. Superficial.    Basal cell carcinoma 04/29/2019   Right mid forearm. Superficial and nodular patterns. EDC.   Basal cell carcinoma 08/17/2020   Right upper antecubital. EDC.   Kidney stones    MS (multiple sclerosis) ([place])    UTI (urinary tract infection)     Past Surgical History:  Procedure Laterality Date   BREAST BIOPSY Right 2010   stereotactic biopsy/ neg/ dr.byrnett   BREAST BIOPSY Left 2012   neg/dr. brynett   CESAREAN SECTION  2008   [facility]   TONSILLECTOMY  2006    There were no vitals filed for this visit.   Subjective Assessment - 02/08/21 1648     Subjective Pt continues to report R elbow pain. Pt reports utilization of the [facility] is going well.    Pertinent History Patient reports history of falls x3 in the past 6 months secondary to drop foot on the L LE. PMH: Multiple sclerosis; elbow fracture of the R UE     Limitations Standing;Walking;Lifting    How long can you stand comfortably? 20 min    How long can you walk comfortably? 1 miles    Patient Stated Goals To be more balanced and core strengthening     Pain Onset More than a month ago            Therex     Leg swings Flex/ABD 1 x 10 reps each LE  TRX Hip sliders ABD/EXT 2 x 5 reps each LE; CGA  FWD lunges from 6" step 2 x 5 reps BLE; CGA   Lateral Single leg treadmill pushes 2 x 10 reps each LE with cues to maintain knee flexion on stance limb  Stair negotiation with SPC x 4  6" inch steps; CGA with cueing for SPC placement in relation to LLE   *rest breaks given between sets            PT Education - 02/08/21 1746     Education Details therex form/technique; stair negotiation with [facility]    Person(s) Educated Patient    Methods Explanation;Demonstration    Comprehension Verbalized understanding;Returned demonstration                 PT Long Term Goals -  08/05/20 1740       PT LONG TERM GOAL #1   Title Patient will be independent with balance HEP to continue benefits of therapy after discharge.    Baseline dependent with form/technique for balance exercise; 03/25/2018: Independent     Time 6    Period Weeks    Status Achieved      PT LONG TERM GOAL #2   Title Patient will improve FGA by 4 points to indicate significant improvement with balancing while ambulating and decrease of fall risk    Baseline 03/25/2018: 28/30; 04/10/2019: 22; 07/21/2019: 29; 09/17/2019: 30/30    Time 6    Period Weeks    Status Achieved      PT LONG TERM GOAL #3   Title Patient will be able to perform prolonged single leg stance >10sec with eyes closed to indicate improvement with static balance and decrease fall risk    Baseline 2 sec in standing EC; 03/25/2018: 3 sec; 04/29/2018: 4 sec; 08/21/2018: 5 sec; 09/19/2018: 6 sec ; 10/21/2018 3 sec; 11/21/2018 5 sec; 01/08/2019: 9 sec; 02/25/2019: 14sec    Time 4    Period Weeks    Status  Achieved      PT LONG TERM GOAL #4   Title Pt will improve 6 MWT >2077ft to demonstrate improved cardiorespiratory fitness    Baseline Deferred to next session: 04/29/2018; 05/01/2018: 1449ft; 08/21/2018: 1462ft; 09/19/2018: 1423ft; 10/21/2018 1621; 11/21/2018 deferred to next session 2/2 time.; 01/08/2019: 1480 ft; 02/25/2019: 1339ft; 04/10/2019: 1400 ft, 05/13/19: 1321 ft; 07/21/2019: 1170 ft; 09/17/2019: 1225ft; 11/17/2019: 1212ft; 01/13/20: 2119; 03/15/20: 1215ft; 06/17/2020: 1223ft    Time 4    Period Weeks    Status Deferred      PT LONG TERM GOAL #5   Title Patient will be able to run 243ft without stopping to better be able to return to recreational activites and address cardiovascular endurance.    Baseline 52ft; 02/25/2019: 48ft; 04/10/2019 deferred    Time 4    Period Weeks    Status Deferred      PT LONG TERM GOAL #6   Title Patient will improve L hip flexion strength to 4/5 for improved steppage gait and reduced fall risk.    Baseline 04/10/2019: L hip flexion 3+/5; 07/21/2019: L Hip 4/5    Time 6    Period Weeks    Status Achieved      PT LONG TERM GOAL #7   Title Patient will be able to lift her affected L LE to the same extent as the R LE to allow for greater amount of foot clearance when ambulating    Baseline L LE: 10", R LE: 15"; 11/17/2019: L LE: 14", R LE 17.5"; 01/13/2020: L LE: 15.5", R LE 18"; 12/20: 18" B    Time 8    Period Weeks    Status Achieved      PT LONG TERM GOAL #8   Title Patient will have full dorsiflexion with 5/5 strength to allow for greater toe clearance with the performance of exercises, most notably when walking.    Baseline L 4/5 5 degree ; R 5/5 12 degree; L 4+/5 5 degree ; R 5/5 12 degree    Time 8    Period Weeks    Status Achieved      PT LONG TERM GOAL  #9   TITLE Patient will have no scuffing along L LE with performance of 6 min WT to indicate    Baseline 06/17/2020: No  scuffing during today    Time 6    Period Weeks    Status Achieved       PT LONG TERM GOAL  #10   TITLE Patient will improve her ABC by 13% to indicate significant improvement in confidence with her balance    Baseline 56.25%; 08/05/20 86.2%    Time 6    Period Weeks    Status Achieved      PT LONG TERM GOAL  #11   TITLE Patient will improve FGA by 3 points to indicate significant improvement in fucntioncal gait and balance with walking.    Baseline 08/05/20 22/30    Time 6    Period Weeks    Status On-going                   Plan - 02/08/21 1739     Clinical Impression Statement PT instructed pt on proper SPC use with stairs negotiation and continues dynamic balance and LE strengthening. Pt reported feeling safer and confident with stair negotiation with SPC following end of session. Pt demonstrated good ability to correct activity with cueing. Pt continued to demonstrate decreased stability in  L>R LE. Continue PT POC. Formal goal reassessment deferred to next visit d/t fatigue.    Personal Factors and Comorbidities Past/Current Experience    Examination-Activity Limitations Locomotion Level    Examination-Participation Restrictions Community Activity    Stability/Clinical Decision Making Evolving/Moderate complexity    Clinical Decision Making Moderate    Rehab Potential Good    Clinical Impairments Affecting Rehab Potential (-) hx of MS; (+) good motivation    PT Frequency 2x / week    PT Duration 12 weeks    PT Treatment/Interventions Moist Heat;Iontophoresis 4mg /ml Dexamethasone;Electrical Stimulation;Aquatic Therapy;Cryotherapy;Therapeutic activities;Neuromuscular re-education;Therapeutic exercise;Patient/family education;Dry needling;Manual techniques;Passive range of motion;Balance training;Functional mobility training;Gait training;Stair training;Joint Manipulations;Prosthetic Training    PT Next Visit Plan Dynamic balance and LE strength exercise    PT Home Exercise Plan hip flexion in sitting for strengthening, hip flexion knee flexion in  standing for balance, bike intervals at 70%-75% max HR    Consulted and Agree with Plan of Care Patient             Patient will benefit from skilled therapeutic intervention in order to improve the following deficits and impairments:  Pain, Decreased coordination, Impaired perceived functional ability, Increased fascial restricitons, Increased muscle spasms, Difficulty walking, Abnormal gait, Decreased balance, Decreased mobility, Impaired sensation, Increased edema  Visit Diagnosis: Difficulty in walking, not elsewhere classified     Problem List There are no problems to display for this patient.    Durwin Reges DPT Sharion Settler, SPT  Durwin Reges, PT 02/09/2021, 12:02 PM  [place] [facility] [address] [place], [address] Phone: [phone]   Fax:  [phone]  Name: Michelle Ruiz MRN: [id number] Date of Birth: [birth year]

## 2021-02-15 ENCOUNTER — Other Ambulatory Visit: Payer: Self-pay

## 2021-02-15 ENCOUNTER — Ambulatory Visit: Payer: BC Managed Care – PPO | Admitting: Physical Therapy

## 2021-02-15 DIAGNOSIS — R262 Difficulty in walking, not elsewhere classified: Secondary | ICD-10-CM | POA: Diagnosis not present

## 2021-02-15 DIAGNOSIS — M6281 Muscle weakness (generalized): Secondary | ICD-10-CM

## 2021-02-15 NOTE — Therapy (Signed)
[facility] [address] [place], [address] Phone: [phone]   Fax:  [phone]  Physical Therapy Treatment  Patient Details  Name: Michelle Ruiz MRN: [id number] Date of Birth: 03-03-1971 No data recorded  Encounter Date: 02/15/2021   PT End of Session - 02/15/21 1726     Visit Number 151    Number of Visits 200    Date for PT Re-Evaluation 03/04/21    Authorization - Visit Number 1    Progress Note Due on Visit 10    PT Start Time 3419    PT Stop Time 1730    PT Time Calculation (min) 44 min    Equipment Utilized During Treatment Gait belt    Activity Tolerance Patient tolerated treatment well    Behavior During Therapy WFL for tasks assessed/performed             Past Medical History:  Diagnosis Date   Abnormal Pap smear of cervix    Actinic keratosis    Anxiety    Basal cell carcinoma 04/14/2008   Right upper ant. thigh. BCC with sclerosis.   Basal cell carcinoma 08/14/2008   Right lat. lower thigh. Superficial.    Basal cell carcinoma 04/29/2019   Right mid forearm. Superficial and nodular patterns. EDC.   Basal cell carcinoma 08/17/2020   Right upper antecubital. EDC.   Kidney stones    MS (multiple sclerosis) ([place])    UTI (urinary tract infection)     Past Surgical History:  Procedure Laterality Date   BREAST BIOPSY Right 2010   stereotactic biopsy/ neg/ dr.byrnett   BREAST BIOPSY Left 2012   neg/dr. brynett   CESAREAN SECTION  2008   [facility]   TONSILLECTOMY  2006    There were no vitals filed for this visit.     Therex  Leg swings Flex/ABD 1 x 10 reps each LE  SLS  R: 13sec; 22sec; 28sec = 21sec avg L: 6.7sec; 4sec; 12.5sec = 7.73sec avg  FGA with supervision for all tests for safety; most difficulty with 2 box negotiation, tandem walk, speed change from fast to slow. Better stair negotiation without UE support, some evidence of sway  lateral lunge 2x 12 with  cuing for slow return for increased SLS demand CGA for safety    Eduation on SLS practice for HEP with understanding  Not billed 6MWT supervision for safety throughout                  PT Education - 02/15/21 1726     Education Details therex form/technique, HEP update    Person(s) Educated Patient    Methods Explanation;Demonstration;Verbal cues    Comprehension Verbalized understanding;Returned demonstration;Verbal cues required                 PT Long Term Goals - 02/15/21 1654       PT LONG TERM GOAL #1   Title Patient will be independent with balance HEP to continue benefits of therapy after discharge.    Baseline dependent with form/technique for balance exercise; 03/25/2018: Independent     Time 6    Period Weeks    Status Achieved      PT LONG TERM GOAL #2   Title Patient will improve FGA by 4 points to indicate significant improvement with balancing while ambulating and decrease of fall risk    Baseline 03/25/2018: 28/30; 04/10/2019: 22; 07/21/2019: 29; 09/17/2019: 30/30    Time 6  Period Weeks    Status Achieved      PT LONG TERM GOAL #3   Title Patient will be able to perform prolonged single leg stance >10sec with eyes closed to indicate improvement with static balance and decrease fall risk    Baseline 2 sec in standing EC; 03/25/2018: 3 sec; 04/29/2018: 4 sec; 08/21/2018: 5 sec; 09/19/2018: 6 sec ; 10/21/2018 3 sec; 11/21/2018 5 sec; 01/08/2019: 9 sec; 02/25/2019: 14sec    Time 4    Period Weeks    Status Achieved      PT LONG TERM GOAL #4   Title Pt will improve 6 MWT >1546ft to demonstrate improved cardiorespiratory fitness    Baseline 02/15/21 1112ft    Time 8    Period Weeks    Status New      PT LONG TERM GOAL #5   Title Patient will improve FGA by 3 points to indicate significant improvement in fucntioncal gait and balance with walking.    Baseline 08/05/20 22/30; 02/15/21 23    Time 4    Period Weeks    Status On-going      PT  LONG TERM GOAL #6   Title Patient will improve L hip flexion strength to 4/5 for improved steppage gait and reduced fall risk.    Baseline 04/10/2019: L hip flexion 3+/5; 07/21/2019: L Hip 4/5    Time 6    Period Weeks    Status Achieved      PT LONG TERM GOAL #7   Title Patient will be able to lift her affected L LE to the same extent as the R LE to allow for greater amount of foot clearance when ambulating    Baseline L LE: 10", R LE: 15"; 11/17/2019: L LE: 14", R LE 17.5"; 01/13/2020: L LE: 15.5", R LE 18"; 12/20: 18" B    Time 8    Period Weeks    Status Achieved      PT LONG TERM GOAL #8   Title Patient will have full dorsiflexion with 5/5 strength to allow for greater toe clearance with the performance of exercises, most notably when walking.    Baseline L 4/5 5 degree ; R 5/5 12 degree; L 4+/5 5 degree ; R 5/5 12 degree    Time 8    Period Weeks    Status Achieved      PT LONG TERM GOAL  #9   TITLE Patient will have no scuffing along L LE with performance of 6 min WT to indicate    Baseline 06/17/2020: No scuffing during today    Time 6    Period Weeks    Status Achieved      PT LONG TERM GOAL  #10   TITLE Patient will improve her ABC by 13% to indicate significant improvement in confidence with her balance    Baseline 56.25%; 08/05/20 86.2%    Time 6    Period Weeks    Status Achieved      PT LONG TERM GOAL  #11   TITLE Pt will demonstrate L SLS of 21 sec in order to demonstrate symmetrical static balance of unaffected LE to decrease fall risk    Baseline 02/15/21 R 21 sec; L 7.73    Time 8    Period Weeks    Status New                   Plan - 02/15/21 1728     Clinical  Impression Statement PT completed formal reassessment this session, updating 6MWT goal, adding SLS goal in lieu of symmetrical static balance, and with patient demonstrating progress with FGA. Patient has not met MCID goal status for FGA, but does demonstrate improvement. Patient will  continue to benefit from skilled PT to address ongoing goals to decrease fall risk and increase quality of life. PT educated patient on SLS addition to HEP (LLE) which she verbalzies understanding of. PT will continue progression as able.    Personal Factors and Comorbidities Past/Current Experience    Examination-Activity Limitations Locomotion Level    Examination-Participation Restrictions Community Activity    Stability/Clinical Decision Making Evolving/Moderate complexity    Clinical Decision Making Moderate    Rehab Potential Good    Clinical Impairments Affecting Rehab Potential (-) hx of MS; (+) good motivation    PT Frequency 2x / week    PT Duration 12 weeks    PT Treatment/Interventions Moist Heat;Iontophoresis 4mg /ml Dexamethasone;Electrical Stimulation;Aquatic Therapy;Cryotherapy;Therapeutic activities;Neuromuscular re-education;Therapeutic exercise;Patient/family education;Dry needling;Manual techniques;Passive range of motion;Balance training;Gait training;Stair training;Joint Manipulations;Prosthetic Training    PT Next Visit Plan Dynamic balance and LE strength exercise    PT Home Exercise Plan hip flexion in sitting for strengthening, hip flexion knee flexion in standing for balance, bike intervals at 70%-75% max HR    Consulted and Agree with Plan of Care Patient             Patient will benefit from skilled therapeutic intervention in order to improve the following deficits and impairments:  Pain, Decreased coordination, Impaired perceived functional ability, Increased fascial restricitons, Increased muscle spasms, Difficulty walking, Abnormal gait, Decreased balance, Decreased mobility, Impaired sensation, Increased edema  Visit Diagnosis: Difficulty in walking, not elsewhere classified  Muscle weakness (generalized)     Problem List There are no problems to display for this patient.  Durwin Reges DPT Durwin Reges, PT 02/15/2021, 5:33 PM  [facility] [address], [place], [address] Phone: [phone]   Fax:  [phone]  Name: SABRIAH HOBBINS MRN: [id number] Date of Birth: 1970-08-22

## 2021-02-22 ENCOUNTER — Ambulatory Visit (INDEPENDENT_AMBULATORY_CARE_PROVIDER_SITE_OTHER): Payer: BC Managed Care – PPO | Admitting: Dermatology

## 2021-02-22 ENCOUNTER — Encounter: Payer: BC Managed Care – PPO | Admitting: Physical Therapy

## 2021-02-22 ENCOUNTER — Other Ambulatory Visit: Payer: Self-pay

## 2021-02-22 DIAGNOSIS — D2262 Melanocytic nevi of left upper limb, including shoulder: Secondary | ICD-10-CM

## 2021-02-22 DIAGNOSIS — L578 Other skin changes due to chronic exposure to nonionizing radiation: Secondary | ICD-10-CM | POA: Diagnosis not present

## 2021-02-22 DIAGNOSIS — L820 Inflamed seborrheic keratosis: Secondary | ICD-10-CM | POA: Diagnosis not present

## 2021-02-22 DIAGNOSIS — Z872 Personal history of diseases of the skin and subcutaneous tissue: Secondary | ICD-10-CM

## 2021-02-22 DIAGNOSIS — L570 Actinic keratosis: Secondary | ICD-10-CM

## 2021-02-22 DIAGNOSIS — Z1283 Encounter for screening for malignant neoplasm of skin: Secondary | ICD-10-CM

## 2021-02-22 DIAGNOSIS — Z85828 Personal history of other malignant neoplasm of skin: Secondary | ICD-10-CM

## 2021-02-22 DIAGNOSIS — L719 Rosacea, unspecified: Secondary | ICD-10-CM

## 2021-02-22 DIAGNOSIS — L814 Other melanin hyperpigmentation: Secondary | ICD-10-CM

## 2021-02-22 DIAGNOSIS — D229 Melanocytic nevi, unspecified: Secondary | ICD-10-CM

## 2021-02-22 DIAGNOSIS — L821 Other seborrheic keratosis: Secondary | ICD-10-CM

## 2021-02-22 DIAGNOSIS — D1801 Hemangioma of skin and subcutaneous tissue: Secondary | ICD-10-CM

## 2021-02-22 MED ORDER — AZELAIC ACID 15 % EX GEL: 1.0000 "application " | Freq: Two times a day (BID) | CUTANEOUS | 5 refills | Status: AC

## 2021-02-22 NOTE — Progress Notes (Signed)
Follow-Up Visit   Subjective  Michelle Ruiz is a 50 y.o. female who presents for the following: TBSE (Patient here for full body skin exam and skin cancer screening. Patient with hx of BCC, AK's. Patient does have a spot at right hand, present for a few weeks, asymptomatic, scaly spot at right nose, spot behind left ear that patient picks at.) and Rash (Rash at cheeks where mask lays, looks like pimples. ).  Patient had AK at right nasal root from last visit not treated because she had a meeting the next day. Per last note, will treat at follow up.   The following portions of the chart were reviewed this encounter and updated as appropriate:       Review of Systems:  No other skin or systemic complaints except as noted in HPI or Assessment and Plan.  Objective  Well appearing patient in no apparent distress; mood and affect are within normal limits.  A full examination was performed including scalp, head, eyes, ears, nose, lips, neck, chest, axillae, abdomen, back, buttocks, bilateral upper extremities, bilateral lower extremities, hands, feet, fingers, toes, fingernails, and toenails. All findings within normal limits unless otherwise noted below.  right nasal root x 1 Erythematous thin papules/macules with gritty scale.   left postauricular neck at hairline x 1, right hand dorsum x 1 (2) Erythematous keratotic papule at left postauricular Waxy pink flat papule at right hand dorsum  Left Mid Back, left shoulder 5 x 3 mm medium brown macule at left mid back 4 mm brown macule with darker center at left shoulder Photos compared, stable             right medial upper calf 92mm waxy medium dark brown papule  left malar cheek Erythema with small pink papules   Assessment & Plan  AK (actinic keratosis) right nasal root x 1  Actinic keratoses are precancerous spots that appear secondary to cumulative UV radiation exposure/sun exposure over time. They are chronic with  expected duration over 1 year. A portion of actinic keratoses will progress to squamous cell carcinoma of the skin. It is not possible to reliably predict which spots will progress to skin cancer and so treatment is recommended to prevent development of skin cancer.  Recommend daily broad spectrum sunscreen SPF 30+ to sun-exposed areas, reapply every 2 hours as needed.  Recommend staying in the shade or wearing long sleeves, sun glasses (UVA+UVB protection) and wide brim hats (4-inch brim around the entire circumference of the hat). Call for new or changing lesions.   Pt has had two PDT treatments on face in March 22 with good results  Destruction of lesion - right nasal root x 1  Destruction method: cryotherapy   Informed consent: discussed and consent obtained   Lesion destroyed using liquid nitrogen: Yes   Region frozen until ice ball extended beyond lesion: Yes   Outcome: patient tolerated procedure well with no complications   Post-procedure details: wound care instructions given   Additional details:  Prior to procedure, discussed risks of blister formation, small wound, skin dyspigmentation, or rare scar following cryotherapy. Recommend Vaseline ointment to treated areas while healing.   Inflamed seborrheic keratosis left postauricular neck at hairline x 1, right hand dorsum x 1  Recheck L postauricular on f/up.  Pt to RTC if doesn't clear  Destruction of lesion - left postauricular neck at hairline x 1, right hand dorsum x 1  Destruction method: cryotherapy   Informed consent: discussed and consent  obtained   Lesion destroyed using liquid nitrogen: Yes   Region frozen until ice ball extended beyond lesion: Yes   Outcome: patient tolerated procedure well with no complications   Post-procedure details: wound care instructions given   Additional details:  Prior to procedure, discussed risks of blister formation, small wound, skin dyspigmentation, or rare scar following  cryotherapy. Recommend Vaseline ointment to treated areas while healing.   Nevus Left Mid Back, left shoulder  Benign-appearing.  Observation.  Call clinic for new or changing moles.  Recommend daily use of broad spectrum spf 30+ sunscreen to sun-exposed areas.    Seborrheic keratosis right medial upper calf  Benign-appearing.  Observation.  Call clinic for new or changing lesions.  Recommend daily use of broad spectrum spf 30+ sunscreen to sun-exposed areas.    Rosacea left malar cheek  Rosacea is a chronic progressive skin condition usually affecting the face of adults, causing redness and/or acne bumps. It is treatable but not curable. It sometimes affects the eyes (ocular rosacea) as well. It may respond to topical and/or systemic medication and can flare with stress, sun exposure, alcohol, exercise and some foods.  Daily application of broad spectrum spf 30+ sunscreen to face is recommended to reduce flares.  Start azelaic acid gel 15% twice daily to face.   Azelaic Acid 15 % gel - left malar cheek Apply 1 application topically 2 (two) times daily. After skin is thoroughly washed and patted dry, gently but thoroughly massage a thin film of azelaic acid cream into the affected area twice daily, in the morning and evening.  Lentigines - Scattered tan macules - Due to sun exposure - Benign-appearing, observe - Recommend daily broad spectrum sunscreen SPF 30+ to sun-exposed areas, reapply every 2 hours as needed. - Call for any changes  Seborrheic Keratoses - Stuck-on, waxy, tan-brown papules and/or plaques  - Benign-appearing - Discussed benign etiology and prognosis. - Observe - Call for any changes  Melanocytic Nevi - Tan-brown and/or pink-flesh-colored symmetric macules and papules - Benign appearing on exam today - Observation - Call clinic for new or changing moles - Recommend daily use of broad spectrum spf 30+ sunscreen to sun-exposed areas.   Hemangiomas - Red  papules - Discussed benign nature - Observe - Call for any changes  Skin cancer screening performed today.  History of Basal Cell Carcinoma of the Skin - No evidence of recurrence today - Recommend regular full body skin exams - Recommend daily broad spectrum sunscreen SPF 30+ to sun-exposed areas, reapply every 2 hours as needed.  - Call if any new or changing lesions are noted between office visits  Actinic Damage - Severe, confluent actinic changes with pre-cancerous actinic keratoses chest - Severe, chronic, not at goal, secondary to cumulative UV radiation exposure over time - diffuse scaly erythematous macules and papules with underlying dyspigmentation - Discussed Prescription "Field Treatment" for Severe, Chronic Confluent Actinic Changes with Pre-Cancerous Actinic Keratoses Field treatment involves treatment of an entire area of skin that has confluent Actinic Changes (Sun/ Ultraviolet light damage) and PreCancerous Actinic Keratoses by method of PhotoDynamic Therapy (PDT) and/or prescription Topical Chemotherapy agents such as 5-fluorouracil, 5-fluorouracil/calcipotriene, and/or imiquimod.  The purpose is to decrease the number of clinically evident and subclinical PreCancerous lesions to prevent progression to development of skin cancer by chemically destroying early precancer changes that may or may not be visible.  It has been shown to reduce the risk of developing skin cancer in the treated area. As a result of  treatment, redness, scaling, crusting, and open sores may occur during treatment course. One or more than one of these methods may be used and may have to be used several times to control, suppress and eliminate the PreCancerous changes. Discussed treatment course, expected reaction, and possible side effects. - Recommend daily broad spectrum sunscreen SPF 30+ to sun-exposed areas, reapply every 2 hours as needed.  - Staying in the shade or wearing long sleeves, sun glasses  (UVA+UVB protection) and wide brim hats (4-inch brim around the entire circumference of the hat) are also recommended. - Call for new or changing lesions. -Pt will schedule PDT to chest  Return in about 6 months (around 08/22/2021) for TBSE, ISK follow up, AK follow up and PDT to chest.  Graciella Belton, RMA, am acting as scribe for Brendolyn Patty, MD .  Documentation: I have reviewed the above documentation for accuracy and completeness, and I agree with the above.  Brendolyn Patty MD

## 2021-02-22 NOTE — Patient Instructions (Addendum)
Cryotherapy Aftercare  Wash gently with soap and water everyday.   Apply Vaseline and Band-Aid daily until healed.    Melanoma ABCDEs  Melanoma is the most dangerous type of skin cancer, and is the leading cause of death from skin disease.  You are more likely to develop melanoma if you: Have light-colored skin, light-colored eyes, or red or blond hair Spend a lot of time in the sun Tan regularly, either outdoors or in a tanning bed Have had blistering sunburns, especially during childhood Have a close family member who has had a melanoma Have atypical moles or large birthmarks  Early detection of melanoma is key since treatment is typically straightforward and cure rates are extremely high if we catch it early.   The first sign of melanoma is often a change in a mole or a new dark spot.  The ABCDE system is a way of remembering the signs of melanoma.  A for asymmetry:  The two halves do not match. B for border:  The edges of the growth are irregular. C for color:  A mixture of colors are present instead of an even brown color. D for diameter:  Melanomas are usually (but not always) greater than 72m - the size of a pencil eraser. E for evolution:  The spot keeps changing in size, shape, and color.  Please check your skin once per month between visits. You can use a small mirror in front and a large mirror behind you to keep an eye on the back side or your body.   If you see any new or changing lesions before your next follow-up, please call to schedule a visit.  Please continue daily skin protection including broad spectrum sunscreen SPF 30+ to sun-exposed areas, reapplying every 2 hours as needed when you're outdoors.    If You Need Anything After Your Visit  If you have any questions or concerns for your doctor, please call our main line at [phone] press option 4 to reach your doctor's medical assistant. If no one answers, please leave a voicemail as directed and we will  return your call as soon as possible. Messages left after 4 pm will be answered the following business day.   You may also send [place] message via [place] We typically respond to MyChart messages within 1-2 business days.  For prescription refills, please ask your pharmacy to contact our office. Our fax number is [phone]  If you have an urgent issue when the clinic is closed that cannot wait until the next business day, you can page your doctor at the number below.    Please note that while we do our best to be available for urgent issues outside of office hours, we are not available 24/7.   If you have an urgent issue and are unable to reach [place] you may choose to seek medical care at your doctor's office, retail clinic, urgent care center, or emergency room.  If you have a medical emergency, please immediately call 911 or go to the emergency department.  Pager Numbers  - Dr. KNehemiah Massed [phone] - Dr. MLaurence Ferrari [phone] - Dr. SNicole Kindred [phone] In the event of inclement weather, please call our main line at [phone] an update on the status of any delays or closures.  Dermatology Medication Tips: Please keep the boxes that topical medications come in in order to help keep track of the instructions about where and how to use these. Pharmacies typically print the medication  instructions only on the boxes and not directly on the medication tubes.   If your medication is too expensive, please contact our office at [phone] option 4 or send [place] a message through [place].   We are unable to tell what your co-pay for medications will be in advance as this is different depending on your insurance coverage. However, we may be able to find a substitute medication at lower cost or fill out paperwork to get insurance to cover a needed medication.   If a prior authorization is required to get your medication covered by your insurance company, please allow [place] 1-2 business  days to complete this process.  Drug prices often vary depending on where the prescription is filled and some pharmacies may offer cheaper prices.  The website www.goodrx.com contains coupons for medications through different pharmacies. The prices here do not account for what the cost may be with help from insurance (it may be cheaper with your insurance), but the website can give you the price if you did not use any insurance.  - You can print the associated coupon and take it with your prescription to the pharmacy.  - You may also stop by our office during regular business hours and pick up a GoodRx coupon card.  - If you need your prescription sent electronically to a different pharmacy, notify our office through [facility] or by phone at [phone] option 4.     Si Usted Necesita Algo Despus de Su Visita  Tambin puede enviarnos un mensaje a travs de Pharmacist, community. Por lo general respondemos a los mensajes de MyChart en el transcurso de 1 a 2 das hbiles.  Para renovar recetas, por favor pida a su farmacia que se ponga en contacto con nuestra oficina. Harland Dingwall de fax es [place] [phone].  Si tiene un asunto urgente cuando la clnica est cerrada y que no puede esperar hasta el siguiente da hbil, puede llamar/localizar a su doctor(a) al nmero que aparece a continuacin.   Por favor, tenga en cuenta que aunque hacemos todo lo posible para estar disponibles para asuntos urgentes fuera del horario de [place], no estamos disponibles las 24 horas del da, los 7 das de la [place].   Si tiene un problema urgente y no puede comunicarse con nosotros, puede optar por buscar atencin mdica  en el consultorio de su doctor(a), en una clnica privada, en un centro de atencin urgente o en una sala de emergencias.  Si tiene Engineering geologist, por favor llame inmediatamente al 911 o vaya a la sala de emergencias.  Nmeros de bper  - Dr. Nehemiah Massed: [phone]  - Dra. Moye:  [phone]  - Dra. Nicole Kindred: [phone]  En caso de inclemencias del [place], por favor llame a Johnsie Kindred principal al [phone] para una actualizacin sobre el [place] de cualquier retraso o cierre.  Consejos para la medicacin en dermatologa: Por favor, guarde las cajas en las que vienen los medicamentos de uso tpico para ayudarle a seguir las instrucciones sobre dnde y cmo usarlos. Las farmacias generalmente imprimen las instrucciones del medicamento slo en las cajas y no directamente en los tubos del [place].   Si su medicamento es muy caro, por favor, pngase en contacto con Zigmund Daniel llamando al [phone] y presione la opcin 4 o envenos un mensaje a travs de Pharmacist, community.   No podemos decirle cul ser su copago por los medicamentos por adelantado ya que esto es diferente dependiendo de la cobertura de su seguro. Sin embargo,  es posible que podamos encontrar un medicamento sustituto a Electrical engineer un formulario para que el seguro cubra el medicamento que se considera necesario.   Si se requiere una autorizacin previa para que su compaa de seguros [place] su medicamento, por favor permtanos de 1 a 2 das hbiles para completar este proceso.  Los precios de los medicamentos varan con frecuencia dependiendo del Environmental consultant de dnde se surte la receta y alguna farmacias pueden ofrecer precios ms baratos.  El sitio web www.goodrx.com tiene cupones para medicamentos de Airline pilot. Los precios aqu no tienen en cuenta lo que podra costar con la ayuda del seguro (puede ser ms barato con su seguro), pero el sitio web puede darle el precio si no utiliz Research scientist (physical sciences).  - Puede imprimir el cupn correspondiente y llevarlo con su receta a la farmacia.  - Tambin puede pasar por nuestra oficina durante el horario de atencin regular y Charity fundraiser una tarjeta de cupones de GoodRx.  - Si necesita que su receta se enve electrnicamente a una farmacia diferente,  informe a nuestra oficina a travs de MyChart de [place] o por telfono llamando al [phone] y presione la opcin 4.

## 2021-03-01 ENCOUNTER — Encounter: Payer: Self-pay | Admitting: Physical Therapy

## 2021-03-01 ENCOUNTER — Ambulatory Visit: Payer: BC Managed Care – PPO | Attending: Neurology | Admitting: Physical Therapy

## 2021-03-01 DIAGNOSIS — R262 Difficulty in walking, not elsewhere classified: Secondary | ICD-10-CM | POA: Insufficient documentation

## 2021-03-01 DIAGNOSIS — Z9181 History of falling: Secondary | ICD-10-CM | POA: Diagnosis present

## 2021-03-01 DIAGNOSIS — M6281 Muscle weakness (generalized): Secondary | ICD-10-CM | POA: Insufficient documentation

## 2021-03-01 NOTE — Therapy (Signed)
[facility] [address] [place], [address] Phone: [phone]   Fax:  [phone]  Physical Therapy Treatment  Patient Details  Name: Michelle Ruiz MRN: [id number] Date of Birth: 09-Aug-1970 No data recorded  Encounter Date: 03/01/2021   PT End of Session - 03/01/21 1555     Visit Number 152    Number of Visits 200    Date for PT Re-Evaluation 03/04/21    Authorization - Visit Number 2    Progress Note Due on Visit 10    PT Start Time 1520    PT Stop Time 1600    PT Time Calculation (min) 40 min    Equipment Utilized During Treatment Gait belt    Activity Tolerance Patient tolerated treatment well    Behavior During Therapy WFL for tasks assessed/performed             Past Medical History:  Diagnosis Date   Abnormal Pap smear of cervix    Actinic keratosis    Anxiety    Basal cell carcinoma 04/14/2008   Right upper ant. thigh. BCC with sclerosis.   Basal cell carcinoma 08/14/2008   Right lat. lower thigh. Superficial.    Basal cell carcinoma 04/29/2019   Right mid forearm. Superficial and nodular patterns. EDC.   Basal cell carcinoma 08/17/2020   Right upper antecubital. EDC.   Kidney stones    MS (multiple sclerosis) ([place])    UTI (urinary tract infection)     Past Surgical History:  Procedure Laterality Date   BREAST BIOPSY Right 2010   stereotactic biopsy/ neg/ dr.byrnett   BREAST BIOPSY Left 2012   neg/dr. brynett   CESAREAN SECTION  2008   [facility]   TONSILLECTOMY  2006    There were no vitals filed for this visit.   Subjective Assessment - 03/01/21 1530     Subjective Returnts to PT over Is doing well overall, no falls since last visit. She has bee using her SPC with walking, and rpeorts she is using it well and not "carrying it". No changes in medical history since last visit.    Patient is accompained by: Family member    Pertinent History Patient reports history of falls x3  in the past 6 months secondary to drop foot on the L LE. PMH: Multiple sclerosis; elbow fracture of the R UE    Limitations Standing;Walking;Lifting    How long can you stand comfortably? 20 min    How long can you walk comfortably? 1 miles    Patient Stated Goals To be more balanced and core strengthening     Pain Onset More than a month ago              Therex    Leg swings Flex/ABD 1 x 10 reps each LE   TRX alt lunges unilateral support 3x 12 with increased rest time d/t fatigue  Lateral step onto and off 8in step 2x 12 (6 each direction)with 34finger support bilat to treadmill bar, supervision for safety   Alt lateral lunge 2x 12 with decreased speed to decrease spasticity   *rest breaks given between sets                                 PT Education - 03/01/21 1555     Education Details therex form/technique    Person(s) Educated Patient    Methods  Explanation;Demonstration;Verbal cues    Comprehension Verbalized understanding;Returned demonstration;Verbal cues required                 PT Long Term Goals - 02/15/21 1654       PT LONG TERM GOAL #1   Title Patient will be independent with balance HEP to continue benefits of therapy after discharge.    Baseline dependent with form/technique for balance exercise; 03/25/2018: Independent     Time 6    Period Weeks    Status Achieved      PT LONG TERM GOAL #2   Title Patient will improve FGA by 4 points to indicate significant improvement with balancing while ambulating and decrease of fall risk    Baseline 03/25/2018: 28/30; 04/10/2019: 22; 07/21/2019: 29; 09/17/2019: 30/30    Time 6    Period Weeks    Status Achieved      PT LONG TERM GOAL #3   Title Patient will be able to perform prolonged single leg stance >10sec with eyes closed to indicate improvement with static balance and decrease fall risk    Baseline 2 sec in standing EC; 03/25/2018: 3 sec; 04/29/2018: 4 sec; 08/21/2018: 5  sec; 09/19/2018: 6 sec ; 10/21/2018 3 sec; 11/21/2018 5 sec; 01/08/2019: 9 sec; 02/25/2019: 14sec    Time 4    Period Weeks    Status Achieved      PT LONG TERM GOAL #4   Title Pt will improve 6 MWT >1510ft to demonstrate improved cardiorespiratory fitness    Baseline 02/15/21 114ft    Time 8    Period Weeks    Status New      PT LONG TERM GOAL #5   Title Patient will improve FGA by 3 points to indicate significant improvement in fucntioncal gait and balance with walking.    Baseline 08/05/20 22/30; 02/15/21 23    Time 4    Period Weeks    Status On-going      PT LONG TERM GOAL #6   Title Patient will improve L hip flexion strength to 4/5 for improved steppage gait and reduced fall risk.    Baseline 04/10/2019: L hip flexion 3+/5; 07/21/2019: L Hip 4/5    Time 6    Period Weeks    Status Achieved      PT LONG TERM GOAL #7   Title Patient will be able to lift her affected L LE to the same extent as the R LE to allow for greater amount of foot clearance when ambulating    Baseline L LE: 10", R LE: 15"; 11/17/2019: L LE: 14", R LE 17.5"; 01/13/2020: L LE: 15.5", R LE 18"; 12/20: 18" B    Time 8    Period Weeks    Status Achieved      PT LONG TERM GOAL #8   Title Patient will have full dorsiflexion with 5/5 strength to allow for greater toe clearance with the performance of exercises, most notably when walking.    Baseline L 4/5 5 degree ; R 5/5 12 degree; L 4+/5 5 degree ; R 5/5 12 degree    Time 8    Period Weeks    Status Achieved      PT LONG TERM GOAL  #9   TITLE Patient will have no scuffing along L LE with performance of 6 min WT to indicate    Baseline 06/17/2020: No scuffing during today    Time 6    Period Weeks  Status Achieved      PT LONG TERM GOAL  #10   TITLE Patient will improve her ABC by 13% to indicate significant improvement in confidence with her balance    Baseline 56.25%; 08/05/20 86.2%    Time 6    Period Weeks    Status Achieved      PT LONG TERM  GOAL  #11   TITLE Pt will demonstrate L SLS of 21 sec in order to demonstrate symmetrical static balance of unaffected LE to decrease fall risk    Baseline 02/15/21 R 21 sec; L 7.73    Time 8    Period Weeks    Status New                   Plan - 03/01/21 1648     Clinical Impression Statement PT continued therex progression for increased endurance and strength, and increased dynamic balance with success. Patietn requires increased rest breaks between sets d/t increased fatigue from absence of PT and for energy conservation to prepare for increased walking and stair ambulation to son's concert toight. Paiten tis able to comply with cuing for proper technique of therex with excellent motivation throughout session. Patient requires some level of gaurding for safety, with good use of ankle strategies for fall recovery as needed. PT will continue progression as able.    Personal Factors and Comorbidities Past/Current Experience    Examination-Activity Limitations Locomotion Level    Examination-Participation Restrictions Community Activity    Stability/Clinical Decision Making Evolving/Moderate complexity    Clinical Decision Making Moderate    Rehab Potential Good    Clinical Impairments Affecting Rehab Potential (-) hx of MS; (+) good motivation    PT Frequency 2x / week    PT Duration 12 weeks    PT Treatment/Interventions Moist Heat;Iontophoresis 4mg /ml Dexamethasone;Electrical Stimulation;Aquatic Therapy;Cryotherapy;Therapeutic activities;Neuromuscular re-education;Therapeutic exercise;Patient/family education;Dry needling;Manual techniques;Passive range of motion;Balance training;Gait training;Stair training;Joint Manipulations;Prosthetic Training    PT Next Visit Plan Dynamic balance and LE strength exercise    PT Home Exercise Plan hip flexion in sitting for strengthening, hip flexion knee flexion in standing for balance, bike intervals at 70%-75% max HR    Consulted and Agree  with Plan of Care Patient             Patient will benefit from skilled therapeutic intervention in order to improve the following deficits and impairments:  Pain, Decreased coordination, Impaired perceived functional ability, Increased fascial restricitons, Increased muscle spasms, Difficulty walking, Abnormal gait, Decreased balance, Decreased mobility, Impaired sensation, Increased edema  Visit Diagnosis: Difficulty in walking, not elsewhere classified  Muscle weakness (generalized)     Problem List There are no problems to display for this patient.  Durwin Reges DPT Durwin Reges, PT 03/01/2021, 4:50 PM  [facility] [address] [place], [address] Phone: [phone]   Fax:  [phone]  Name: Michelle Ruiz MRN: [id number] Date of Birth: September 06, 1970

## 2021-03-08 ENCOUNTER — Ambulatory Visit: Payer: BC Managed Care – PPO | Admitting: Physical Therapy

## 2021-03-08 ENCOUNTER — Encounter: Payer: Self-pay | Admitting: Physical Therapy

## 2021-03-08 DIAGNOSIS — Z9181 History of falling: Secondary | ICD-10-CM

## 2021-03-08 DIAGNOSIS — R262 Difficulty in walking, not elsewhere classified: Secondary | ICD-10-CM

## 2021-03-08 DIAGNOSIS — M6281 Muscle weakness (generalized): Secondary | ICD-10-CM

## 2021-03-08 NOTE — Therapy (Signed)
[facility] [address] [place], [address] Phone: [phone]   Fax:  [phone]  Physical Therapy Treatment  Patient Details  Name: Michelle Ruiz MRN: [id number] Date of Birth: [birth year] No data recorded  Encounter Date: 03/08/2021   PT End of Session - 03/08/21 1713     Visit Number 153    Number of Visits 200    Date for PT Re-Evaluation 03/04/21    Authorization - Visit Number 3    Progress Note Due on Visit 10    PT Start Time 4332    PT Stop Time 1728    PT Time Calculation (min) 38 min    Equipment Utilized During Treatment Gait belt    Activity Tolerance Patient tolerated treatment well    Behavior During Therapy WFL for tasks assessed/performed             Past Medical History:  Diagnosis Date   Abnormal Pap smear of cervix    Actinic keratosis    Anxiety    Basal cell carcinoma 04/14/2008   Right upper ant. thigh. BCC with sclerosis.   Basal cell carcinoma 08/14/2008   Right lat. lower thigh. Superficial.    Basal cell carcinoma 04/29/2019   Right mid forearm. Superficial and nodular patterns. EDC.   Basal cell carcinoma 08/17/2020   Right upper antecubital. EDC.   Kidney stones    MS (multiple sclerosis) ([place])    UTI (urinary tract infection)     Past Surgical History:  Procedure Laterality Date   BREAST BIOPSY Right 2010   stereotactic biopsy/ neg/ dr.byrnett   BREAST BIOPSY Left 2012   neg/dr. brynett   CESAREAN SECTION  2008   [facility]   TONSILLECTOMY  2006    There were no vitals filed for this visit.   Subjective Assessment - 03/08/21 1651     Subjective Pt had a fall Sunday walking in her neighborhood with her cane, and was stepping up onto a curb. Cannot recall if she put her cane on curb first or what foot she put up first, but did fall on her L side. She kept her cane in her hand and didn't "fling it" but cannot recall if she was able to use it to brace her  fall. Reports she was wearing a puffer coat so it was not a super hard fall. Does have a brise but is not in any pain.    Patient is accompained by: Family member    Pertinent History Patient reports history of falls x3 in the past 6 months secondary to drop foot on the L LE. PMH: Multiple sclerosis; elbow fracture of the R UE    Limitations Standing;Walking;Lifting    How long can you stand comfortably? 20 min    How long can you walk comfortably? 1 miles    Patient Stated Goals To be more balanced and core strengthening     Pain Onset More than a month ago              Therex    Leg swings Flex/ABD 1 x 10 reps each LE   Step on and off 5in step with cane x12 with good carry over of sequencing and techique following demo Education to keep cane "in front" with good understanding; education on "how to fall" with SPC with understanding  Banded lateral walks 74ft L and R x2 with supervision for safety; min cuing for eccentric control with good  carry over  Alt lateral step 6in with 2sec hold 2x 12 with min cuing for technique, occassional single finger touch to treadmill bar  In mini squat abd circles on slider x12 each LE with min cuing for technique, more difficulty standing on LLE than RLE      *rest breaks given between sets                                         PT Education - 03/08/21 1700     Education Details therex form/technique    Person(s) Educated Patient    Methods Explanation;Demonstration;Verbal cues    Comprehension Verbalized understanding;Returned demonstration;Verbal cues required                 PT Long Term Goals - 02/15/21 1654       PT LONG TERM GOAL #1   Title Patient will be independent with balance HEP to continue benefits of therapy after discharge.    Baseline dependent with form/technique for balance exercise; 03/25/2018: Independent     Time 6    Period Weeks    Status Achieved      PT LONG TERM GOAL  #2   Title Patient will improve FGA by 4 points to indicate significant improvement with balancing while ambulating and decrease of fall risk    Baseline 03/25/2018: 28/30; 04/10/2019: 22; 07/21/2019: 29; 09/17/2019: 30/30    Time 6    Period Weeks    Status Achieved      PT LONG TERM GOAL #3   Title Patient will be able to perform prolonged single leg stance >10sec with eyes closed to indicate improvement with static balance and decrease fall risk    Baseline 2 sec in standing EC; 03/25/2018: 3 sec; 04/29/2018: 4 sec; 08/21/2018: 5 sec; 09/19/2018: 6 sec ; 10/21/2018 3 sec; 11/21/2018 5 sec; 01/08/2019: 9 sec; 02/25/2019: 14sec    Time 4    Period Weeks    Status Achieved      PT LONG TERM GOAL #4   Title Pt will improve 6 MWT >1555ft to demonstrate improved cardiorespiratory fitness    Baseline 02/15/21 112ft    Time 8    Period Weeks    Status New      PT LONG TERM GOAL #5   Title Patient will improve FGA by 3 points to indicate significant improvement in fucntioncal gait and balance with walking.    Baseline 08/05/20 22/30; 02/15/21 23    Time 4    Period Weeks    Status On-going      PT LONG TERM GOAL #6   Title Patient will improve L hip flexion strength to 4/5 for improved steppage gait and reduced fall risk.    Baseline 04/10/2019: L hip flexion 3+/5; 07/21/2019: L Hip 4/5    Time 6    Period Weeks    Status Achieved      PT LONG TERM GOAL #7   Title Patient will be able to lift her affected L LE to the same extent as the R LE to allow for greater amount of foot clearance when ambulating    Baseline L LE: 10", R LE: 15"; 11/17/2019: L LE: 14", R LE 17.5"; 01/13/2020: L LE: 15.5", R LE 18"; 12/20: 18" B    Time 8    Period Weeks    Status Achieved  PT LONG TERM GOAL #8   Title Patient will have full dorsiflexion with 5/5 strength to allow for greater toe clearance with the performance of exercises, most notably when walking.    Baseline L 4/5 5 degree ; R 5/5 12 degree; L  4+/5 5 degree ; R 5/5 12 degree    Time 8    Period Weeks    Status Achieved      PT LONG TERM GOAL  #9   TITLE Patient will have no scuffing along L LE with performance of 6 min WT to indicate    Baseline 06/17/2020: No scuffing during today    Time 6    Period Weeks    Status Achieved      PT LONG TERM GOAL  #10   TITLE Patient will improve her ABC by 13% to indicate significant improvement in confidence with her balance    Baseline 56.25%; 08/05/20 86.2%    Time 6    Period Weeks    Status Achieved      PT LONG TERM GOAL  #11   TITLE Pt will demonstrate L SLS of 21 sec in order to demonstrate symmetrical static balance of unaffected LE to decrease fall risk    Baseline 02/15/21 R 21 sec; L 7.73    Time 8    Period Weeks    Status New                   Plan - 03/09/21 8295     Clinical Impression Statement PT continued progression with focus of dynamic balance and lateral stability post-fall with success. PT reviewed cane use with curb negotiation to prevent unsteadiness in the future with pt demonstrating excellent carry over and understanding of sequencing. Patient with no post fall soreness/pain that is worrisome of larger issue throughout session. PT will continue progression as able, with continued dynamic balance focus.    Personal Factors and Comorbidities Past/Current Experience    Examination-Activity Limitations Locomotion Level    Examination-Participation Restrictions Community Activity    Stability/Clinical Decision Making Evolving/Moderate complexity    Clinical Decision Making Moderate    Rehab Potential Good    Clinical Impairments Affecting Rehab Potential (-) hx of MS; (+) good motivation    PT Frequency 2x / week    PT Duration 12 weeks    PT Treatment/Interventions Moist Heat;Iontophoresis 4mg /ml Dexamethasone;Electrical Stimulation;Aquatic Therapy;Cryotherapy;Therapeutic activities;Neuromuscular re-education;Therapeutic exercise;Patient/family  education;Dry needling;Manual techniques;Passive range of motion;Balance training;Gait training;Stair training;Joint Manipulations;Prosthetic Training    PT Next Visit Plan Dynamic balance and LE strength exercise    PT Home Exercise Plan hip flexion in sitting for strengthening, hip flexion knee flexion in standing for balance, bike intervals at 70%-75% max HR    Consulted and Agree with Plan of Care Patient             Patient will benefit from skilled therapeutic intervention in order to improve the following deficits and impairments:  Pain, Decreased coordination, Impaired perceived functional ability, Increased fascial restricitons, Increased muscle spasms, Difficulty walking, Abnormal gait, Decreased balance, Decreased mobility, Impaired sensation, Increased edema  Visit Diagnosis: Difficulty in walking, not elsewhere classified  Muscle weakness (generalized)  History of falling     Problem List There are no problems to display for this patient.  Durwin Reges DPT Durwin Reges, PT 03/09/2021, 9:03 AM  [facility] [address], [place], [address] Phone: [phone]   Fax:  [phone]  Name: ELLINA SIVERTSEN MRN: [id number] Date of Birth: [birth year]

## 2021-03-15 ENCOUNTER — Encounter: Payer: Self-pay | Admitting: Physical Therapy

## 2021-03-15 ENCOUNTER — Ambulatory Visit: Payer: BC Managed Care – PPO | Admitting: Physical Therapy

## 2021-03-15 DIAGNOSIS — R262 Difficulty in walking, not elsewhere classified: Secondary | ICD-10-CM | POA: Diagnosis not present

## 2021-03-15 NOTE — Therapy (Signed)
[facility] [address], [place], [address] Phone: [phone]   Fax:  [phone]  Physical Therapy Treatment  Patient Details  Name: Michelle Ruiz MRN: [id number] Date of Birth: [birth year] No data recorded  Encounter Date: 03/15/2021   PT End of Session - 03/15/21 1732     Visit Number 154    Number of Visits 200    Date for PT Re-Evaluation 03/04/21    Authorization - Visit Number 4    Progress Note Due on Visit 10    PT Start Time 3875    PT Stop Time 1730    PT Time Calculation (min) 41 min    Activity Tolerance Patient tolerated treatment well    Behavior During Therapy [facility] for tasks assessed/performed             Past Medical History:  Diagnosis Date   Abnormal Pap smear of cervix    Actinic keratosis    Anxiety    Basal cell carcinoma 04/14/2008   Right upper ant. thigh. BCC with sclerosis.   Basal cell carcinoma 08/14/2008   Right lat. lower thigh. Superficial.    Basal cell carcinoma 04/29/2019   Right mid forearm. Superficial and nodular patterns. EDC.   Basal cell carcinoma 08/17/2020   Right upper antecubital. EDC.   Kidney stones    MS (multiple sclerosis) ([place])    UTI (urinary tract infection)     Past Surgical History:  Procedure Laterality Date   BREAST BIOPSY Right 2010   stereotactic biopsy/ neg/ dr.byrnett   BREAST BIOPSY Left 2012   neg/dr. brynett   CESAREAN SECTION  2008   [facility]   TONSILLECTOMY  2006    There were no vitals filed for this visit.   Subjective Assessment - 03/15/21 1652     Subjective Pt reports she did end up bruising from her fall, no pai from this, has not sought medical treatment. No falls since last visit. Feels like her walking is getting more difficult with more "spazzing" in the L leg.    Patient is accompained by: Family member    Pertinent History Patient reports history of falls x3 in the past 6 months secondary to drop foot on the  L LE. PMH: Multiple sclerosis; elbow fracture of the R UE    Limitations Standing;Walking;Lifting    How long can you stand comfortably? 20 min    How long can you walk comfortably? 1 miles    Patient Stated Goals To be more balanced and core strengthening     Pain Onset More than a month ago                Therex    Leg swings Flex/ABD 1 x 10 reps each LE   Walking lunges 2x 52ft without support, supervision for safety; unable to complete step through d/t balance modified to step to  Monster walks not resisted x1ft; with YTB x60ft with less spasticity today   Walking 164ft with increased LLE spasticity through swing; with 5# AW x185ft 2# AW x128ft with marked decreased spasticity with more normalized gait with 2# AW  Alt heel taps to 6in cone on a 3in step with 2# AW x20; without x20 with unilateral UE support, decreased spasticity making movement with AW more fluid                           PT Education -  03/15/21 1701     Education Details therex form/technique    Person(s) Educated Patient    Methods Explanation;Demonstration;Verbal cues    Comprehension Verbalized understanding;Returned demonstration;Verbal cues required                 PT Long Term Goals - 02/15/21 1654       PT LONG TERM GOAL #1   Title Patient will be independent with balance HEP to continue benefits of therapy after discharge.    Baseline dependent with form/technique for balance exercise; 03/25/2018: Independent     Time 6    Period Weeks    Status Achieved      PT LONG TERM GOAL #2   Title Patient will improve FGA by 4 points to indicate significant improvement with balancing while ambulating and decrease of fall risk    Baseline 03/25/2018: 28/30; 04/10/2019: 22; 07/21/2019: 29; 09/17/2019: 30/30    Time 6    Period Weeks    Status Achieved      PT LONG TERM GOAL #3   Title Patient will be able to perform prolonged single leg stance >10sec with eyes closed  to indicate improvement with static balance and decrease fall risk    Baseline 2 sec in standing EC; 03/25/2018: 3 sec; 04/29/2018: 4 sec; 08/21/2018: 5 sec; 09/19/2018: 6 sec ; 10/21/2018 3 sec; 11/21/2018 5 sec; 01/08/2019: 9 sec; 02/25/2019: 14sec    Time 4    Period Weeks    Status Achieved      PT LONG TERM GOAL #4   Title Pt will improve 6 MWT >1581ft to demonstrate improved cardiorespiratory fitness    Baseline 02/15/21 1134ft    Time 8    Period Weeks    Status New      PT LONG TERM GOAL #5   Title Patient will improve FGA by 3 points to indicate significant improvement in fucntioncal gait and balance with walking.    Baseline 08/05/20 22/30; 02/15/21 23    Time 4    Period Weeks    Status On-going      PT LONG TERM GOAL #6   Title Patient will improve L hip flexion strength to 4/5 for improved steppage gait and reduced fall risk.    Baseline 04/10/2019: L hip flexion 3+/5; 07/21/2019: L Hip 4/5    Time 6    Period Weeks    Status Achieved      PT LONG TERM GOAL #7   Title Patient will be able to lift her affected L LE to the same extent as the R LE to allow for greater amount of foot clearance when ambulating    Baseline L LE: 10", R LE: 15"; 11/17/2019: L LE: 14", R LE 17.5"; 01/13/2020: L LE: 15.5", R LE 18"; 12/20: 18" B    Time 8    Period Weeks    Status Achieved      PT LONG TERM GOAL #8   Title Patient will have full dorsiflexion with 5/5 strength to allow for greater toe clearance with the performance of exercises, most notably when walking.    Baseline L 4/5 5 degree ; R 5/5 12 degree; L 4+/5 5 degree ; R 5/5 12 degree    Time 8    Period Weeks    Status Achieved      PT LONG TERM GOAL  #9   TITLE Patient will have no scuffing along L LE with performance of 6 min WT to indicate  Baseline 06/17/2020: No scuffing during today    Time 6    Period Weeks    Status Achieved      PT LONG TERM GOAL  #10   TITLE Patient will improve her ABC by 13% to indicate  significant improvement in confidence with her balance    Baseline 56.25%; 08/05/20 86.2%    Time 6    Period Weeks    Status Achieved      PT LONG TERM GOAL  #11   TITLE Pt will demonstrate L SLS of 21 sec in order to demonstrate symmetrical static balance of unaffected LE to decrease fall risk    Baseline 02/15/21 R 21 sec; L 7.73    Time 8    Period Weeks    Status New                   Plan - 03/16/21 1005     Clinical Impression Statement PT continued therex progression with dynamic balance and general strength focus with success. Patient demonstrates less spastic LLE motion with increased resistance with ankle weight. PT educated pt on rationale of this, and possibility of her utilizing an ankle weight for her daily walks to decrease spastic movements to increase safety with understanding. PT will continue progression as able.    Personal Factors and Comorbidities Past/Current Experience    Examination-Activity Limitations Locomotion Level    Examination-Participation Restrictions Community Activity    Stability/Clinical Decision Making Evolving/Moderate complexity    Clinical Decision Making Moderate    Rehab Potential Good    Clinical Impairments Affecting Rehab Potential (-) hx of MS; (+) good motivation    PT Frequency 2x / week    PT Duration 12 weeks    PT Treatment/Interventions Moist Heat;Iontophoresis 4mg /ml Dexamethasone;Electrical Stimulation;Aquatic Therapy;Cryotherapy;Therapeutic activities;Neuromuscular re-education;Therapeutic exercise;Patient/family education;Dry needling;Manual techniques;Passive range of motion;Balance training;Gait training;Stair training;Joint Manipulations;Prosthetic Training    PT Next Visit Plan Dynamic balance and LE strength exercise    PT Home Exercise Plan hip flexion in sitting for strengthening, hip flexion knee flexion in standing for balance, bike intervals at 70%-75% max HR    Consulted and Agree with Plan of Care Patient              Patient will benefit from skilled therapeutic intervention in order to improve the following deficits and impairments:  Pain, Decreased coordination, Impaired perceived functional ability, Increased fascial restricitons, Increased muscle spasms, Difficulty walking, Abnormal gait, Decreased balance, Decreased mobility, Impaired sensation, Increased edema  Visit Diagnosis: Difficulty in walking, not elsewhere classified     Problem List There are no problems to display for this patient.  Durwin Reges DPT Durwin Reges, PT 03/16/2021, 10:15 AM  [facility] [address] [place], [address] Phone: [phone]   Fax:  [phone]  Name: Michelle Ruiz MRN: [id number] Date of Birth: October 23, 1970

## 2021-03-22 ENCOUNTER — Encounter: Payer: Self-pay | Admitting: Physical Therapy

## 2021-03-22 ENCOUNTER — Ambulatory Visit: Payer: BC Managed Care – PPO | Admitting: Physical Therapy

## 2021-03-22 DIAGNOSIS — M6281 Muscle weakness (generalized): Secondary | ICD-10-CM

## 2021-03-22 DIAGNOSIS — R262 Difficulty in walking, not elsewhere classified: Secondary | ICD-10-CM | POA: Diagnosis not present

## 2021-03-22 NOTE — Therapy (Signed)
[facility] [address] [place], [address] Phone: [phone]   Fax:  [phone]  Physical Therapy Treatment  Patient Details  Name: Michelle Ruiz MRN: [id number] Date of Birth: Mar 27, 1971 No data recorded  Encounter Date: 03/22/2021   PT End of Session - 03/22/21 1727     Visit Number 155    Number of Visits 200    Date for PT Re-Evaluation 03/04/21    Authorization - Visit Number 5    Progress Note Due on Visit 10    PT Start Time 1645             Past Medical History:  Diagnosis Date   Abnormal Pap smear of cervix    Actinic keratosis    Anxiety    Basal cell carcinoma 04/14/2008   Right upper ant. thigh. BCC with sclerosis.   Basal cell carcinoma 08/14/2008   Right lat. lower thigh. Superficial.    Basal cell carcinoma 04/29/2019   Right mid forearm. Superficial and nodular patterns. EDC.   Basal cell carcinoma 08/17/2020   Right upper antecubital. EDC.   Kidney stones    MS (multiple sclerosis) ([place])    UTI (urinary tract infection)     Past Surgical History:  Procedure Laterality Date   BREAST BIOPSY Right 2010   stereotactic biopsy/ neg/ dr.byrnett   BREAST BIOPSY Left 2012   neg/dr. brynett   CESAREAN SECTION  2008   [facility]   TONSILLECTOMY  2006    There were no vitals filed for this visit.   Subjective Assessment - 03/22/21 1651     Subjective Doing well overall. No falls since last visit    Patient is accompained by: Family member    Pertinent History Patient reports history of falls x3 in the past 6 months secondary to drop foot on the L LE. PMH: Multiple sclerosis; elbow fracture of the R UE    Limitations Standing;Walking;Lifting    How long can you stand comfortably? 20 min    How long can you walk comfortably? 1 miles    Patient Stated Goals To be more balanced and core strengthening     Pain Onset More than a month ago             Therex    Leg swings  Flex/ABD 1 x 10 reps each LE  Slider abd in mini squat 2x 12 bilat with unilateral 2 finger support  264ft amb 2# AW on LLE for increased foot clearance; negotiating 12 hurdles this way over 246ft with CGA for safety, more difficulty when starting with LLE stepping over   SLS opposite hand touch to cone 2x 6 bilat no UE support when R SLS, [place] support with supervision for LLE    Alt heel taps to 6in cone on a 3in step with 2# AW x20; without x20 with unilateral UE support, decreased spasticity making movement with AW more fluid                                     PT Education - 03/22/21 1727     Education Details therex form/technique    Person(s) Educated Patient    Methods Explanation;Demonstration;Verbal cues    Comprehension Verbalized understanding;Returned demonstration;Verbal cues required                 PT Long Term Goals -  02/15/21 1654       PT LONG TERM GOAL #1   Title Patient will be independent with balance HEP to continue benefits of therapy after discharge.    Baseline dependent with form/technique for balance exercise; 03/25/2018: Independent     Time 6    Period Weeks    Status Achieved      PT LONG TERM GOAL #2   Title Patient will improve FGA by 4 points to indicate significant improvement with balancing while ambulating and decrease of fall risk    Baseline 03/25/2018: 28/30; 04/10/2019: 22; 07/21/2019: 29; 09/17/2019: 30/30    Time 6    Period Weeks    Status Achieved      PT LONG TERM GOAL #3   Title Patient will be able to perform prolonged single leg stance >10sec with eyes closed to indicate improvement with static balance and decrease fall risk    Baseline 2 sec in standing EC; 03/25/2018: 3 sec; 04/29/2018: 4 sec; 08/21/2018: 5 sec; 09/19/2018: 6 sec ; 10/21/2018 3 sec; 11/21/2018 5 sec; 01/08/2019: 9 sec; 02/25/2019: 14sec    Time 4    Period Weeks    Status Achieved      PT LONG TERM GOAL #4   Title Pt will improve 6  MWT >1555ft to demonstrate improved cardiorespiratory fitness    Baseline 02/15/21 1132ft    Time 8    Period Weeks    Status New      PT LONG TERM GOAL #5   Title Patient will improve FGA by 3 points to indicate significant improvement in fucntioncal gait and balance with walking.    Baseline 08/05/20 22/30; 02/15/21 23    Time 4    Period Weeks    Status On-going      PT LONG TERM GOAL #6   Title Patient will improve L hip flexion strength to 4/5 for improved steppage gait and reduced fall risk.    Baseline 04/10/2019: L hip flexion 3+/5; 07/21/2019: L Hip 4/5    Time 6    Period Weeks    Status Achieved      PT LONG TERM GOAL #7   Title Patient will be able to lift her affected L LE to the same extent as the R LE to allow for greater amount of foot clearance when ambulating    Baseline L LE: 10", R LE: 15"; 11/17/2019: L LE: 14", R LE 17.5"; 01/13/2020: L LE: 15.5", R LE 18"; 12/20: 18" B    Time 8    Period Weeks    Status Achieved      PT LONG TERM GOAL #8   Title Patient will have full dorsiflexion with 5/5 strength to allow for greater toe clearance with the performance of exercises, most notably when walking.    Baseline L 4/5 5 degree ; R 5/5 12 degree; L 4+/5 5 degree ; R 5/5 12 degree    Time 8    Period Weeks    Status Achieved      PT LONG TERM GOAL  #9   TITLE Patient will have no scuffing along L LE with performance of 6 min WT to indicate    Baseline 06/17/2020: No scuffing during today    Time 6    Period Weeks    Status Achieved      PT LONG TERM GOAL  #10   TITLE Patient will improve her ABC by 13% to indicate significant improvement in confidence with her  balance    Baseline 56.25%; 08/05/20 86.2%    Time 6    Period Weeks    Status Achieved      PT LONG TERM GOAL  #11   TITLE Pt will demonstrate L SLS of 21 sec in order to demonstrate symmetrical static balance of unaffected LE to decrease fall risk    Baseline 02/15/21 R 21 sec; L 7.73    Time 8     Period Weeks    Status New                   Plan - 03/23/21 1006     Clinical Impression Statement PT continued therex progression for dynamic balance with increased balance demand with gait tasks, and general endurance with success. Patient demonstrates less spastic movements in open chain with resistance of AW, with evident continued difficulty with LLE static balance, and obstacle negotation with LLE in dynamic balance tasks. Patient is able to comply with all cuing for proper technique of therex with some gaurding needed for safety throughout session, with excellent motivation throughout session. PT will continue progression as able.    Personal Factors and Comorbidities Past/Current Experience    Examination-Activity Limitations Locomotion Level    Examination-Participation Restrictions Community Activity    Stability/Clinical Decision Making Evolving/Moderate complexity    Clinical Decision Making Moderate    Rehab Potential Good    Clinical Impairments Affecting Rehab Potential (-) hx of MS; (+) good motivation    PT Frequency 2x / week    PT Duration 12 weeks    PT Treatment/Interventions Moist Heat;Iontophoresis 4mg /ml Dexamethasone;Electrical Stimulation;Aquatic Therapy;Cryotherapy;Therapeutic activities;Neuromuscular re-education;Therapeutic exercise;Patient/family education;Dry needling;Manual techniques;Passive range of motion;Balance training;Gait training;Stair training;Joint Manipulations;Prosthetic Training    PT Next Visit Plan Dynamic balance and LE strength exercise    PT Home Exercise Plan hip flexion in sitting for strengthening, hip flexion knee flexion in standing for balance, bike intervals at 70%-75% max HR    Consulted and Agree with Plan of Care Patient             Patient will benefit from skilled therapeutic intervention in order to improve the following deficits and impairments:  Pain, Decreased coordination, Impaired perceived functional ability,  Increased fascial restricitons, Increased muscle spasms, Difficulty walking, Abnormal gait, Decreased balance, Decreased mobility, Impaired sensation, Increased edema  Visit Diagnosis: Difficulty in walking, not elsewhere classified  Muscle weakness (generalized)     Problem List There are no problems to display for this patient.  Durwin Reges DPT Durwin Reges, PT 03/23/2021, 10:15 AM  [facility] [address], [place], [address] Phone: [phone]   Fax:  [phone]  Name: Michelle Ruiz MRN: [id number] Date of Birth: 1970-06-12

## 2021-03-31 ENCOUNTER — Other Ambulatory Visit: Payer: Self-pay | Admitting: *Deleted

## 2021-03-31 DIAGNOSIS — Z87891 Personal history of nicotine dependence: Secondary | ICD-10-CM

## 2021-04-05 ENCOUNTER — Ambulatory Visit: Payer: BC Managed Care – PPO | Attending: Neurology | Admitting: Physical Therapy

## 2021-04-05 ENCOUNTER — Encounter: Payer: Self-pay | Admitting: Physical Therapy

## 2021-04-05 DIAGNOSIS — M6281 Muscle weakness (generalized): Secondary | ICD-10-CM | POA: Diagnosis present

## 2021-04-05 DIAGNOSIS — R262 Difficulty in walking, not elsewhere classified: Secondary | ICD-10-CM | POA: Insufficient documentation

## 2021-04-05 NOTE — Therapy (Signed)
[facility] [address] [place], [address] Phone: [phone]   Fax:  [phone]  Physical Therapy Treatment  Patient Details  Name: Michelle Ruiz MRN: [id number] Date of Birth: Oct 03, 1970 No data recorded  Encounter Date: 04/05/2021   PT End of Session - 04/05/21 1525     Visit Number 156    Number of Visits 200    Date for PT Re-Evaluation 05/26/21    Authorization - Visit Number 6    Progress Note Due on Visit 10    PT Start Time 3220    PT Stop Time 1555    PT Time Calculation (min) 40 min    Equipment Utilized During Treatment Gait belt    Activity Tolerance Patient tolerated treatment well    Behavior During Therapy WFL for tasks assessed/performed             Past Medical History:  Diagnosis Date   Abnormal Pap smear of cervix    Actinic keratosis    Anxiety    Basal cell carcinoma 04/14/2008   Right upper ant. thigh. BCC with sclerosis.   Basal cell carcinoma 08/14/2008   Right lat. lower thigh. Superficial.    Basal cell carcinoma 04/29/2019   Right mid forearm. Superficial and nodular patterns. EDC.   Basal cell carcinoma 08/17/2020   Right upper antecubital. EDC.   Kidney stones    MS (multiple sclerosis) ([place])    UTI (urinary tract infection)     Past Surgical History:  Procedure Laterality Date   BREAST BIOPSY Right 2010   stereotactic biopsy/ neg/ dr.byrnett   BREAST BIOPSY Left 2012   neg/dr. brynett   [facility]AN SECTION  2008   [facility]   TONSILLECTOMY  2006    There were no vitals filed for this visit.   Subjective Assessment - 04/05/21 1522     Subjective Doing well overall. No pain or falls since last visit. Does report last night she scuffed her foot not clearing it well. Has not had much time to walk d/t weather and kids schedule.    Patient is accompained by: Family member    Pertinent History Patient reports history of falls x3 in the past 6 months secondary  to drop foot on the L LE. PMH: Multiple sclerosis; elbow fracture of the R UE    Limitations Standing;Walking;Lifting    How long can you stand comfortably? 20 min    How long can you walk comfortably? 1 miles    Patient Stated Goals To be more balanced and core strengthening     Pain Onset More than a month ago            Therex    Leg swings Flex/ABD 1 x 12 reps each LE   Slider abd in mini squat 2x 12 bilat with unilateral 2 finger support   367ft amb 3# AW with cuing for L foot clearance and heel strike, pt performing RLE vaulting for clearance of LLE  Subsequent 343ft with AW with better carry over of foot clearance, continued difficulty with heel strike, patient reports this is "tight" at ankle CGA through all dynamic gait.  PROM DF L: 16d R: 20d AROM DF L: 9d R: 17d  Heel strike onto 8in step > forward lunge for DF 2x 12 with good carry over following cuing for technique Following above exercise 1 rep of active DF on R = 15d  Standing DF 2x 12 with decreased  R toe height around 9th rep  Alt lunge with unilateral TRX support x12 with cuing for increased closed chain DF with success                              PT Education - 04/05/21 1524     Education Details therex form/technique    Person(s) Educated Patient    Methods Explanation;Demonstration;Verbal cues    Comprehension Verbalized understanding;Returned demonstration;Verbal cues required                 PT Long Term Goals - 02/15/21 1654       PT LONG TERM GOAL #1   Title Patient will be independent with balance HEP to continue benefits of therapy after discharge.    Baseline dependent with form/technique for balance exercise; 03/25/2018: Independent     Time 6    Period Weeks    Status Achieved      PT LONG TERM GOAL #2   Title Patient will improve FGA by 4 points to indicate significant improvement with balancing while ambulating and decrease of fall risk    Baseline  03/25/2018: 28/30; 04/10/2019: 22; 07/21/2019: 29; 09/17/2019: 30/30    Time 6    Period Weeks    Status Achieved      PT LONG TERM GOAL #3   Title Patient will be able to perform prolonged single leg stance >10sec with eyes closed to indicate improvement with static balance and decrease fall risk    Baseline 2 sec in standing EC; 03/25/2018: 3 sec; 04/29/2018: 4 sec; 08/21/2018: 5 sec; 09/19/2018: 6 sec ; 10/21/2018 3 sec; 11/21/2018 5 sec; 01/08/2019: 9 sec; 02/25/2019: 14sec    Time 4    Period Weeks    Status Achieved      PT LONG TERM GOAL #4   Title Pt will improve 6 MWT >1544ft to demonstrate improved cardiorespiratory fitness    Baseline 02/15/21 1180ft    Time 8    Period Weeks    Status New      PT LONG TERM GOAL #5   Title Patient will improve FGA by 3 points to indicate significant improvement in fucntioncal gait and balance with walking.    Baseline 08/05/20 22/30; 02/15/21 23    Time 4    Period Weeks    Status On-going      PT LONG TERM GOAL #6   Title Patient will improve L hip flexion strength to 4/5 for improved steppage gait and reduced fall risk.    Baseline 04/10/2019: L hip flexion 3+/5; 07/21/2019: L Hip 4/5    Time 6    Period Weeks    Status Achieved      PT LONG TERM GOAL #7   Title Patient will be able to lift her affected L LE to the same extent as the R LE to allow for greater amount of foot clearance when ambulating    Baseline L LE: 10", R LE: 15"; 11/17/2019: L LE: 14", R LE 17.5"; 01/13/2020: L LE: 15.5", R LE 18"; 12/20: 18" B    Time 8    Period Weeks    Status Achieved      PT LONG TERM GOAL #8   Title Patient will have full dorsiflexion with 5/5 strength to allow for greater toe clearance with the performance of exercises, most notably when walking.    Baseline L 4/5 5 degree ; R 5/5 12 degree;  L 4+/5 5 degree ; R 5/5 12 degree    Time 8    Period Weeks    Status Achieved      PT LONG TERM GOAL  #9   TITLE Patient will have no scuffing along L  LE with performance of 6 min WT to indicate    Baseline 06/17/2020: No scuffing during today    Time 6    Period Weeks    Status Achieved      PT LONG TERM GOAL  #10   TITLE Patient will improve her ABC by 13% to indicate significant improvement in confidence with her balance    Baseline 56.25%; 08/05/20 86.2%    Time 6    Period Weeks    Status Achieved      PT LONG TERM GOAL  #11   TITLE Pt will demonstrate L SLS of 21 sec in order to demonstrate symmetrical static balance of unaffected LE to decrease fall risk    Baseline 02/15/21 R 21 sec; L 7.73    Time 8    Period Weeks    Status New                   Plan - 04/05/21 1554     Clinical Impression Statement PT continued therex progression for dynamic balance, with focus on increasing DF and motor control of this in gait. PT reports "scuffing her foot" and nearly falling. Patient needs cuing for heel strike and foot clearance through dynamic gait training, and on assessment of DF mobility demonstrates slight deficit in passive DF with only 9d of active availible. On further inquiring PT realizes that previous falls that patient was previously unable to describe mechanism may have been due to inability to clear her foot (ie stepping into the elevator a couple months ago- due to not clearing lip at elevator entrance). Pt demonstrates increased active DF following motor control training, but fast fatigue with active DF exercise. PT encouraged patient to continue practicing active DF in standing and sitting at high repetitions, and educated patient on possible need for bracing in the future should this issue persist with undersatnding.    Personal Factors and Comorbidities Past/Current Experience    Examination-Activity Limitations Locomotion Level    Examination-Participation Restrictions Community Activity    Stability/Clinical Decision Making Evolving/Moderate complexity    Clinical Decision Making Moderate    Rehab Potential  Good    Clinical Impairments Affecting Rehab Potential (-) hx of MS; (+) good motivation    PT Frequency 2x / week    PT Duration 12 weeks    PT Treatment/Interventions Moist Heat;Iontophoresis 4mg /ml Dexamethasone;Electrical Stimulation;Aquatic Therapy;Cryotherapy;Therapeutic activities;Neuromuscular re-education;Therapeutic exercise;Patient/family education;Dry needling;Manual techniques;Passive range of motion;Balance training;Gait training;Stair training;Joint Manipulations;Prosthetic Training    PT Next Visit Plan Dynamic balance and LE strength exercise    PT Home Exercise Plan hip flexion in sitting for strengthening, hip flexion knee flexion in standing for balance, bike intervals at 70%-75% max HR    Consulted and Agree with Plan of Care Patient             Patient will benefit from skilled therapeutic intervention in order to improve the following deficits and impairments:  Pain, Decreased coordination, Impaired perceived functional ability, Increased fascial restricitons, Increased muscle spasms, Difficulty walking, Abnormal gait, Decreased balance, Decreased mobility, Impaired sensation, Increased edema  Visit Diagnosis: Difficulty in walking, not elsewhere classified  Muscle weakness (generalized)     Problem List There are no problems to display  for this patient.  Durwin Reges DPT Durwin Reges, PT 04/05/2021, 4:07 PM  [facility] [address], [place], [address] Phone: [phone]   Fax:  [phone]  Name: Michelle Ruiz MRN: [id number] Date of Birth: [birth year]

## 2021-04-12 ENCOUNTER — Ambulatory Visit: Payer: BC Managed Care – PPO | Admitting: Physical Therapy

## 2021-04-12 DIAGNOSIS — M6281 Muscle weakness (generalized): Secondary | ICD-10-CM

## 2021-04-12 DIAGNOSIS — R262 Difficulty in walking, not elsewhere classified: Secondary | ICD-10-CM

## 2021-04-12 NOTE — Therapy (Signed)
[facility] [address] [place], [address] Phone: [phone]   Fax:  [phone]  Physical Therapy Treatment  Patient Details  Name: Michelle Ruiz MRN: [id number] Date of Birth: [birth year] No data recorded  Encounter Date: 04/12/2021   PT End of Session - 04/12/21 1747     Visit Number 157    Number of Visits 200    Authorization - Visit Number 7    Progress Note Due on Visit 10    PT Start Time 3016    PT Stop Time 0109    PT Time Calculation (min) 45 min    Equipment Utilized During Treatment Gait belt    Activity Tolerance Patient tolerated treatment well    Behavior During Therapy WFL for tasks assessed/performed             Past Medical History:  Diagnosis Date   Abnormal Pap smear of cervix    Actinic keratosis    Anxiety    Basal cell carcinoma 04/14/2008   Right upper ant. thigh. BCC with sclerosis.   Basal cell carcinoma 08/14/2008   Right lat. lower thigh. Superficial.    Basal cell carcinoma 04/29/2019   Right mid forearm. Superficial and nodular patterns. EDC.   Basal cell carcinoma 08/17/2020   Right upper antecubital. EDC.   Kidney stones    MS (multiple sclerosis) ([place])    UTI (urinary tract infection)     Past Surgical History:  Procedure Laterality Date   BREAST BIOPSY Right 2010   stereotactic biopsy/ neg/ dr.byrnett   BREAST BIOPSY Left 2012   neg/dr. brynett   CESAREAN SECTION  2008   [facility]   TONSILLECTOMY  2006    There were no vitals filed for this visit.   Subjective Assessment - 04/12/21 1647     Subjective pnt reports no falls and walked 2 days in a row. She has been trying hard to heel strike and clear her foot which she reports has been sucessful. Post walking, muscles were very sore on quads and front of the shins. Pnt reports no foot scuffing, which is an improvement    Patient is accompained by: Family member    Pertinent History Patient reports  history of falls x3 in the past 6 months secondary to drop foot on the L LE. PMH: Multiple sclerosis; elbow fracture of the R UE    Limitations Standing;Walking;Lifting    How long can you stand comfortably? 20 min    How long can you walk comfortably? 1 miles    Patient Stated Goals To be more balanced and core strengthening     Pain Onset More than a month ago             Therex    Leg swings Flex/ABD 1 x 12 reps each LE   Alt heel taps onto 6in cone placed on 6in step 2x 12 with   Heel strike onto 8in step > forward lunge for DF 2x 15 with good carry over following cuing for technique    273ft amb 3# AW with cuing for L foot clearance and heel strike, pt performing RLE vaulting for clearance of LLE with  214ft amb 3# AW with 16 hurdles (8 each 163ft) with good carry over of cuing for foot clearance and control with step over hurdle Subsequent trial and 319ft with continued improvement and supervision for safety, success with  PT Education - 04/12/21 1742     Education Details therex form/technique    Person(s) Educated Patient    Methods Explanation;Tactile cues;Verbal cues    Comprehension Verbalized understanding;Returned demonstration;Verbal cues required                 PT Long Term Goals - 02/15/21 1654       PT LONG TERM GOAL #1   Title Patient will be independent with balance HEP to continue benefits of therapy after discharge.    Baseline dependent with form/technique for balance exercise; 03/25/2018: Independent     Time 6    Period Weeks    Status Achieved      PT LONG TERM GOAL #2   Title Patient will improve FGA by 4 points to indicate significant improvement with balancing while ambulating and decrease of fall risk    Baseline 03/25/2018: 28/30; 04/10/2019: 22; 07/21/2019: 29; 09/17/2019: 30/30    Time 6    Period Weeks    Status Achieved      PT LONG TERM GOAL #3   Title Patient  will be able to perform prolonged single leg stance >10sec with eyes closed to indicate improvement with static balance and decrease fall risk    Baseline 2 sec in standing EC; 03/25/2018: 3 sec; 04/29/2018: 4 sec; 08/21/2018: 5 sec; 09/19/2018: 6 sec ; 10/21/2018 3 sec; 11/21/2018 5 sec; 01/08/2019: 9 sec; 02/25/2019: 14sec    Time 4    Period Weeks    Status Achieved      PT LONG TERM GOAL #4   Title Pt will improve 6 MWT >1558ft to demonstrate improved cardiorespiratory fitness    Baseline 02/15/21 1134ft    Time 8    Period Weeks    Status New      PT LONG TERM GOAL #5   Title Patient will improve FGA by 3 points to indicate significant improvement in fucntioncal gait and balance with walking.    Baseline 08/05/20 22/30; 02/15/21 23    Time 4    Period Weeks    Status On-going      PT LONG TERM GOAL #6   Title Patient will improve L hip flexion strength to 4/5 for improved steppage gait and reduced fall risk.    Baseline 04/10/2019: L hip flexion 3+/5; 07/21/2019: L Hip 4/5    Time 6    Period Weeks    Status Achieved      PT LONG TERM GOAL #7   Title Patient will be able to lift her affected L LE to the same extent as the R LE to allow for greater amount of foot clearance when ambulating    Baseline L LE: 10", R LE: 15"; 11/17/2019: L LE: 14", R LE 17.5"; 01/13/2020: L LE: 15.5", R LE 18"; 12/20: 18" B    Time 8    Period Weeks    Status Achieved      PT LONG TERM GOAL #8   Title Patient will have full dorsiflexion with 5/5 strength to allow for greater toe clearance with the performance of exercises, most notably when walking.    Baseline L 4/5 5 degree ; R 5/5 12 degree; L 4+/5 5 degree ; R 5/5 12 degree    Time 8    Period Weeks    Status Achieved      PT LONG TERM GOAL  #9   TITLE Patient will have no scuffing along L LE with performance of 6 min  WT to indicate    Baseline 06/17/2020: No scuffing during today    Time 6    Period Weeks    Status Achieved      PT LONG TERM  GOAL  #10   TITLE Patient will improve her ABC by 13% to indicate significant improvement in confidence with her balance    Baseline 56.25%; 08/05/20 86.2%    Time 6    Period Weeks    Status Achieved      PT LONG TERM GOAL  #11   TITLE Pt will demonstrate L SLS of 21 sec in order to demonstrate symmetrical static balance of unaffected LE to decrease fall risk    Baseline 02/15/21 R 21 sec; L 7.73    Time 8    Period Weeks    Status New                   Plan - 04/12/21 1748     Clinical Impression Statement PT continued therex focus of foot clearance and dorsiflexion with carry over into functional gait and obstacle negotiation with success. Patient is able to comply with all cuing for proper technique of therex with some gaurding needed for hurdle negotiation for safety. Pt is improving ability to sequence heel strike and foot clearance to aide in reducing fall risk, without bracing, which is an improvement. PT will continue progression as able.    Personal Factors and Comorbidities Past/Current Experience    Examination-Activity Limitations Locomotion Level    Examination-Participation Restrictions Community Activity    Stability/Clinical Decision Making Evolving/Moderate complexity    Clinical Decision Making Moderate    Rehab Potential Good    Clinical Impairments Affecting Rehab Potential (-) hx of MS; (+) good motivation    PT Frequency 2x / week    PT Duration 12 weeks    PT Treatment/Interventions Moist Heat;Iontophoresis 4mg /ml Dexamethasone;Electrical Stimulation;Aquatic Therapy;Cryotherapy;Therapeutic activities;Neuromuscular re-education;Therapeutic exercise;Patient/family education;Dry needling;Manual techniques;Passive range of motion;Balance training;Gait training;Stair training;Joint Manipulations;Prosthetic Training    PT Next Visit Plan Dynamic balance and LE strength exercise    PT Home Exercise Plan hip flexion in sitting for strengthening, hip flexion knee  flexion in standing for balance, bike intervals at 70%-75% max HR    Consulted and Agree with Plan of Care Patient             Patient will benefit from skilled therapeutic intervention in order to improve the following deficits and impairments:  Pain, Decreased coordination, Impaired perceived functional ability, Increased fascial restricitons, Increased muscle spasms, Difficulty walking, Abnormal gait, Decreased balance, Decreased mobility, Impaired sensation, Increased edema  Visit Diagnosis: Difficulty in walking, not elsewhere classified  Muscle weakness (generalized)     Problem List There are no problems to display for this patient.  Durwin Reges DPT Durwin Reges, PT 04/12/2021, 6:14 PM  [facility] [address] [place], [address] Phone: [phone]   Fax:  [phone]  Name: DAMEKA YOUNKER MRN: [id number] Date of Birth: Feb 02, 1971

## 2021-04-19 ENCOUNTER — Encounter: Payer: Self-pay | Admitting: Physical Therapy

## 2021-04-19 ENCOUNTER — Ambulatory Visit: Payer: BC Managed Care – PPO | Admitting: Physical Therapy

## 2021-04-19 ENCOUNTER — Other Ambulatory Visit: Payer: Self-pay

## 2021-04-19 DIAGNOSIS — M6281 Muscle weakness (generalized): Secondary | ICD-10-CM

## 2021-04-19 DIAGNOSIS — R262 Difficulty in walking, not elsewhere classified: Secondary | ICD-10-CM | POA: Diagnosis not present

## 2021-04-19 NOTE — Therapy (Signed)
[facility] [address] [place], [address] Phone: [phone]   Fax:  [phone]  Physical Therapy Treatment  Patient Details  Name: Michelle Ruiz MRN: [id number] Date of Birth: 1970/11/08 No data recorded  Encounter Date: 04/19/2021   PT End of Session - 04/19/21 1723     Visit Number 158    Number of Visits 200    Date for PT Re-Evaluation 05/26/21    Authorization - Visit Number 8    Progress Note Due on Visit 10    PT Start Time 1645    PT Stop Time 1725    PT Time Calculation (min) 40 min    Equipment Utilized During Treatment Gait belt    Activity Tolerance Patient tolerated treatment well    Behavior During Therapy WFL for tasks assessed/performed             Past Medical History:  Diagnosis Date   Abnormal Pap smear of cervix    Actinic keratosis    Anxiety    Basal cell carcinoma 04/14/2008   Right upper ant. thigh. BCC with sclerosis.   Basal cell carcinoma 08/14/2008   Right lat. lower thigh. Superficial.    Basal cell carcinoma 04/29/2019   Right mid forearm. Superficial and nodular patterns. EDC.   Basal cell carcinoma 08/17/2020   Right upper antecubital. EDC.   Kidney stones    MS (multiple sclerosis) ([place])    UTI (urinary tract infection)     Past Surgical History:  Procedure Laterality Date   BREAST BIOPSY Right 2010   stereotactic biopsy/ neg/ dr.byrnett   BREAST BIOPSY Left 2012   neg/dr. brynett   CESAREAN SECTION  2008   [facility]   TONSILLECTOMY  2006    There were no vitals filed for this visit.   Subjective Assessment - 04/19/21 1718     Subjective Pt reports no falls or updates since last visit. Pt reports walking every night last week, with no near falls, with cane. She is also seeing a Brewing technologist, which she is happy about.    Pertinent History Patient reports history of falls x3 in the past 6 months secondary to drop foot on the L LE. PMH: Multiple  sclerosis; elbow fracture of the R UE    Limitations Standing;Walking;Lifting    How long can you stand comfortably? 20 min    How long can you walk comfortably? 1 miles    Patient Stated Goals To be more balanced and core strengthening               Therex Leg swings Flex/ABD 1 x 12 reps each LE  Heel strike onto 8in step > forward lunge for DF 2x 15 with good carry over following cuing for technique   8in step up with CLLE hip flex + DF 2x 6 with CGA with L step up    366ft amb 3# AW with cuing for L foot clearance and heel strike, with better carry over than previously, vaulating for foot clearance for only about 25ft 15min on treadmill 1.24mph 5% incline grade; rest break; 3.49mins on treadmill 1.46mph 7% incline grade with min cuing throughout for full knee ext with heel contact with good carry over Increased rest time between dynamic balance walking trials  PT Education - 04/19/21 1722     Education Details therex form/technique    Person(s) Educated Patient    Methods Explanation;Demonstration;Verbal cues    Comprehension Verbalized understanding;Returned demonstration;Verbal cues required                 PT Long Term Goals - 02/15/21 1654       PT LONG TERM GOAL #1   Title Patient will be independent with balance HEP to continue benefits of therapy after discharge.    Baseline dependent with form/technique for balance exercise; 03/25/2018: Independent     Time 6    Period Weeks    Status Achieved      PT LONG TERM GOAL #2   Title Patient will improve FGA by 4 points to indicate significant improvement with balancing while ambulating and decrease of fall risk    Baseline 03/25/2018: 28/30; 04/10/2019: 22; 07/21/2019: 29; 09/17/2019: 30/30    Time 6    Period Weeks    Status Achieved      PT LONG TERM GOAL #3   Title Patient will be able to perform prolonged single leg stance >10sec  with eyes closed to indicate improvement with static balance and decrease fall risk    Baseline 2 sec in standing EC; 03/25/2018: 3 sec; 04/29/2018: 4 sec; 08/21/2018: 5 sec; 09/19/2018: 6 sec ; 10/21/2018 3 sec; 11/21/2018 5 sec; 01/08/2019: 9 sec; 02/25/2019: 14sec    Time 4    Period Weeks    Status Achieved      PT LONG TERM GOAL #4   Title Pt will improve 6 MWT >1574ft to demonstrate improved cardiorespiratory fitness    Baseline 02/15/21 1174ft    Time 8    Period Weeks    Status New      PT LONG TERM GOAL #5   Title Patient will improve FGA by 3 points to indicate significant improvement in fucntioncal gait and balance with walking.    Baseline 08/05/20 22/30; 02/15/21 23    Time 4    Period Weeks    Status On-going      PT LONG TERM GOAL #6   Title Patient will improve L hip flexion strength to 4/5 for improved steppage gait and reduced fall risk.    Baseline 04/10/2019: L hip flexion 3+/5; 07/21/2019: L Hip 4/5    Time 6    Period Weeks    Status Achieved      PT LONG TERM GOAL #7   Title Patient will be able to lift her affected L LE to the same extent as the R LE to allow for greater amount of foot clearance when ambulating    Baseline L LE: 10", R LE: 15"; 11/17/2019: L LE: 14", R LE 17.5"; 01/13/2020: L LE: 15.5", R LE 18"; 12/20: 18" B    Time 8    Period Weeks    Status Achieved      PT LONG TERM GOAL #8   Title Patient will have full dorsiflexion with 5/5 strength to allow for greater toe clearance with the performance of exercises, most notably when walking.    Baseline L 4/5 5 degree ; R 5/5 12 degree; L 4+/5 5 degree ; R 5/5 12 degree    Time 8    Period Weeks    Status Achieved      PT LONG TERM GOAL  #9   TITLE Patient will have no scuffing along L LE with performance of 6 min WT  to indicate    Baseline 06/17/2020: No scuffing during today    Time 6    Period Weeks    Status Achieved      PT LONG TERM GOAL  #10   TITLE Patient will improve her ABC by 13% to  indicate significant improvement in confidence with her balance    Baseline 56.25%; 08/05/20 86.2%    Time 6    Period Weeks    Status Achieved      PT LONG TERM GOAL  #11   TITLE Pt will demonstrate L SLS of 21 sec in order to demonstrate symmetrical static balance of unaffected LE to decrease fall risk    Baseline 02/15/21 R 21 sec; L 7.73    Time 8    Period Weeks    Status New                   Plan - 04/19/21 1729     Clinical Impression Statement PT continued therex progression for increased BLE strength with continued DF focus in functional movements with success. PT carried over therex to dynamic balance with gait with continued need for gaurding for safety, min cuing for heel strike and TKE, but increasing ability to self correct these. PT progressed dynamic gait with incline where patient is able to comply with cuing for safety with this, more difficulty with TKE on incline, but good heel strike with consistent cuing. PT will continue progression as able.    Personal Factors and Comorbidities Past/Current Experience    Examination-Activity Limitations Locomotion Level    Examination-Participation Restrictions Community Activity    Stability/Clinical Decision Making Evolving/Moderate complexity    Clinical Decision Making Moderate    Rehab Potential Good    Clinical Impairments Affecting Rehab Potential (-) hx of MS; (+) good motivation    PT Frequency 2x / week    PT Duration 12 weeks    PT Treatment/Interventions Moist Heat;Iontophoresis 4mg /ml Dexamethasone;Electrical Stimulation;Aquatic Therapy;Cryotherapy;Therapeutic activities;Neuromuscular re-education;Therapeutic exercise;Patient/family education;Dry needling;Manual techniques;Passive range of motion;Balance training;Gait training;Stair training;Joint Manipulations;Prosthetic Training    PT Next Visit Plan Dynamic balance and LE strength exercise    PT Home Exercise Plan hip flexion in sitting for strengthening,  hip flexion knee flexion in standing for balance, bike intervals at 70%-75% max HR    Consulted and Agree with Plan of Care Patient             Patient will benefit from skilled therapeutic intervention in order to improve the following deficits and impairments:  Pain, Decreased coordination, Impaired perceived functional ability, Increased fascial restricitons, Increased muscle spasms, Difficulty walking, Abnormal gait, Decreased balance, Decreased mobility, Impaired sensation, Increased edema  Visit Diagnosis: Difficulty in walking, not elsewhere classified  Muscle weakness (generalized)     Problem List There are no problems to display for this patient.  Durwin Reges DPT  Durwin Reges, PT 04/19/2021, 5:35 PM  [facility] [address], [place], [address] Phone: [phone]   Fax:  [phone]  Name: MYKAH BELLOMO MRN: [id number] Date of Birth: 01-22-1971

## 2021-04-26 ENCOUNTER — Encounter: Payer: Self-pay | Admitting: Physical Therapy

## 2021-04-26 ENCOUNTER — Other Ambulatory Visit: Payer: Self-pay

## 2021-04-26 ENCOUNTER — Ambulatory Visit: Payer: BC Managed Care – PPO | Admitting: Physical Therapy

## 2021-04-26 DIAGNOSIS — R262 Difficulty in walking, not elsewhere classified: Secondary | ICD-10-CM | POA: Diagnosis not present

## 2021-04-26 DIAGNOSIS — M6281 Muscle weakness (generalized): Secondary | ICD-10-CM

## 2021-04-26 NOTE — Therapy (Signed)
[facility] [address] [place], [address] Phone: [phone]   Fax:  [phone]  Physical Therapy Treatment  Patient Details  Name: Michelle Ruiz MRN: [id number] Date of Birth: January 29, 1971 No data recorded  Encounter Date: 04/26/2021   PT End of Session - 04/26/21 1717     Visit Number 159    Number of Visits 200    Date for PT Re-Evaluation 05/26/21    Authorization - Visit Number 9    Progress Note Due on Visit 10    PT Start Time 3790    PT Stop Time 1730    PT Time Calculation (min) 43 min    Equipment Utilized During Treatment Gait belt    Activity Tolerance Patient tolerated treatment well    Behavior During Therapy WFL for tasks assessed/performed             Past Medical History:  Diagnosis Date   Abnormal Pap smear of cervix    Actinic keratosis    Anxiety    Basal cell carcinoma 04/14/2008   Right upper ant. thigh. BCC with sclerosis.   Basal cell carcinoma 08/14/2008   Right lat. lower thigh. Superficial.    Basal cell carcinoma 04/29/2019   Right mid forearm. Superficial and nodular patterns. EDC.   Basal cell carcinoma 08/17/2020   Right upper antecubital. EDC.   Kidney stones    MS (multiple sclerosis) ([place])    UTI (urinary tract infection)     Past Surgical History:  Procedure Laterality Date   BREAST BIOPSY Right 2010   stereotactic biopsy/ neg/ dr.byrnett   BREAST BIOPSY Left 2012   neg/dr. brynett   CESAREAN SECTION  2008   [facility]   TONSILLECTOMY  2006    There were no vitals filed for this visit.   Subjective Assessment - 04/26/21 1705     Subjective Pt continues to report she is doing well overall without falls to report. She reports she felt uncoordinated walking yesterday d/t increased humidity outside. She is still focuing on not scuffing her toes, but she is really has to focus on this.    Patient is accompained by: Family member    Pertinent History  Patient reports history of falls x3 in the past 6 months secondary to drop foot on the L LE. PMH: Multiple sclerosis; elbow fracture of the R UE    Limitations Standing;Walking;Lifting    How long can you stand comfortably? 20 min    How long can you walk comfortably? 1 miles    Patient Stated Goals To be more balanced and core strengthening     Pain Onset More than a month ago             Therex [employer] bike L2 35mins for low level endurance seat 15   Duck walks (heel walks) 55ft 2x supervision for safety    8in step up with CLLE hip flex + DF 2x 6 with CGA with L step up    375ft amb 3# AW with cuing for L foot clearance and heel strike, with better carry over than previously, vaulating for foot clearance for only about 58ft Directly after above amb: 65min on treadmill 1.59mph 5% incline grade; with excellent carry over of cuing, minimal cuing for knee ext with heel strike over final minute d/t fatigue Increased rest time between dynamic balance walking trials    With 3# AW on LLE alt heel cone taps to cone  on 6in step 2x 12 with 2 finger support at handrail, instances of mistep knocking cone over at final reps each set indicating fatigue                             PT Education - 04/26/21 1716     Education Details therex form/technique    Person(s) Educated Patient    Methods Explanation;Demonstration;Verbal cues    Comprehension Verbalized understanding;Returned demonstration;Verbal cues required                 PT Long Term Goals - 02/15/21 1654       PT LONG TERM GOAL #1   Title Patient will be independent with balance HEP to continue benefits of therapy after discharge.    Baseline dependent with form/technique for balance exercise; 03/25/2018: Independent     Time 6    Period Weeks    Status Achieved      PT LONG TERM GOAL #2   Title Patient will improve FGA by 4 points to indicate significant improvement with balancing while  ambulating and decrease of fall risk    Baseline 03/25/2018: 28/30; 04/10/2019: 22; 07/21/2019: 29; 09/17/2019: 30/30    Time 6    Period Weeks    Status Achieved      PT LONG TERM GOAL #3   Title Patient will be able to perform prolonged single leg stance >10sec with eyes closed to indicate improvement with static balance and decrease fall risk    Baseline 2 sec in standing EC; 03/25/2018: 3 sec; 04/29/2018: 4 sec; 08/21/2018: 5 sec; 09/19/2018: 6 sec ; 10/21/2018 3 sec; 11/21/2018 5 sec; 01/08/2019: 9 sec; 02/25/2019: 14sec    Time 4    Period Weeks    Status Achieved      PT LONG TERM GOAL #4   Title Pt will improve 6 MWT >1514ft to demonstrate improved cardiorespiratory fitness    Baseline 02/15/21 114ft    Time 8    Period Weeks    Status New      PT LONG TERM GOAL #5   Title Patient will improve FGA by 3 points to indicate significant improvement in fucntioncal gait and balance with walking.    Baseline 08/05/20 22/30; 02/15/21 23    Time 4    Period Weeks    Status On-going      PT LONG TERM GOAL #6   Title Patient will improve L hip flexion strength to 4/5 for improved steppage gait and reduced fall risk.    Baseline 04/10/2019: L hip flexion 3+/5; 07/21/2019: L Hip 4/5    Time 6    Period Weeks    Status Achieved      PT LONG TERM GOAL #7   Title Patient will be able to lift her affected L LE to the same extent as the R LE to allow for greater amount of foot clearance when ambulating    Baseline L LE: 10", R LE: 15"; 11/17/2019: L LE: 14", R LE 17.5"; 01/13/2020: L LE: 15.5", R LE 18"; 12/20: 18" B    Time 8    Period Weeks    Status Achieved      PT LONG TERM GOAL #8   Title Patient will have full dorsiflexion with 5/5 strength to allow for greater toe clearance with the performance of exercises, most notably when walking.    Baseline L 4/5 5 degree ; R 5/5 12 degree;  L 4+/5 5 degree ; R 5/5 12 degree    Time 8    Period Weeks    Status Achieved      PT LONG TERM GOAL   #9   TITLE Patient will have no scuffing along L LE with performance of 6 min WT to indicate    Baseline 06/17/2020: No scuffing during today    Time 6    Period Weeks    Status Achieved      PT LONG TERM GOAL  #10   TITLE Patient will improve her ABC by 13% to indicate significant improvement in confidence with her balance    Baseline 56.25%; 08/05/20 86.2%    Time 6    Period Weeks    Status Achieved      PT LONG TERM GOAL  #11   TITLE Pt will demonstrate L SLS of 21 sec in order to demonstrate symmetrical static balance of unaffected LE to decrease fall risk    Baseline 02/15/21 R 21 sec; L 7.73    Time 8    Period Weeks    Status New                   Plan - 04/26/21 1727     Clinical Impression Statement PT continued therex progression for increased active DF endurance with carry over into functional gait. Pt is able to demonstrate increased activity time, and dynamic gait tasks over several minute (~14mins) consecutively. Pt does require increased rest time following endurance therex, but is increasing activity tolerance well. patient continues to demonstrate heel strike with less cuing needed, for increased saftey with [place] and obstacle negotiation. PT will continue progression as able.    Personal Factors and Comorbidities Past/Current Experience    Examination-Activity Limitations Locomotion Level    Examination-Participation Restrictions Community Activity    Stability/Clinical Decision Making Evolving/Moderate complexity    Clinical Decision Making Moderate    Rehab Potential Good    Clinical Impairments Affecting Rehab Potential (-) hx of MS; (+) good motivation    PT Frequency 2x / week    PT Duration 12 weeks    PT Treatment/Interventions Moist Heat;Iontophoresis 4mg /ml Dexamethasone;Electrical Stimulation;Aquatic Therapy;Cryotherapy;Therapeutic activities;Neuromuscular re-education;Therapeutic exercise;Patient/family education;Dry needling;Manual  techniques;Passive range of motion;Balance training;Gait training;Stair training;Joint Manipulations;Prosthetic Training    PT Next Visit Plan Dynamic balance and LE strength exercise    PT Home Exercise Plan hip flexion in sitting for strengthening, hip flexion knee flexion in standing for balance, bike intervals at 70%-75% max HR    Consulted and Agree with Plan of Care Patient             Patient will benefit from skilled therapeutic intervention in order to improve the following deficits and impairments:  Pain, Decreased coordination, Impaired perceived functional ability, Increased fascial restricitons, Increased muscle spasms, Difficulty walking, Abnormal gait, Decreased balance, Decreased mobility, Impaired sensation, Increased edema  Visit Diagnosis: Difficulty in walking, not elsewhere classified  Muscle weakness (generalized)     Problem List There are no problems to display for this patient.  Durwin Reges DPT Durwin Reges, PT 04/26/2021, 5:51 PM  [facility] [address] [place], [address] Phone: [phone]   Fax:  [phone]  Name: Michelle Ruiz MRN: [id number] Date of Birth: 15-Aug-1970

## 2021-05-03 ENCOUNTER — Ambulatory Visit: Payer: BC Managed Care – PPO | Admitting: Physical Therapy

## 2021-05-04 ENCOUNTER — Ambulatory Visit: Payer: BC Managed Care – PPO | Attending: Neurology | Admitting: Physical Therapy

## 2021-05-04 ENCOUNTER — Other Ambulatory Visit: Payer: Self-pay

## 2021-05-04 ENCOUNTER — Encounter: Payer: Self-pay | Admitting: Physical Therapy

## 2021-05-04 DIAGNOSIS — M6281 Muscle weakness (generalized): Secondary | ICD-10-CM | POA: Diagnosis present

## 2021-05-04 DIAGNOSIS — Z9181 History of falling: Secondary | ICD-10-CM | POA: Insufficient documentation

## 2021-05-04 DIAGNOSIS — R262 Difficulty in walking, not elsewhere classified: Secondary | ICD-10-CM | POA: Diagnosis not present

## 2021-05-04 NOTE — Therapy (Signed)
[facility] [address] [place], [address] Phone: [phone]   Fax:  [phone]  Physical Therapy Treatment  Patient Details  Name: Michelle Ruiz MRN: [id number] Date of Birth: 02-27-1971 No data recorded  Encounter Date: 05/04/2021   PT End of Session - 05/04/21 1652     Visit Number 160    Number of Visits 200    Date for PT Re-Evaluation 05/26/21    Authorization - Visit Number 10    Progress Note Due on Visit 10    PT Start Time 1600    PT Stop Time 6568    PT Time Calculation (min) 45 min    Equipment Utilized During Treatment Gait belt    Activity Tolerance Patient tolerated treatment well    Behavior During Therapy WFL for tasks assessed/performed             Past Medical History:  Diagnosis Date   Abnormal Pap smear of cervix    Actinic keratosis    Anxiety    Basal cell carcinoma 04/14/2008   Right upper ant. thigh. BCC with sclerosis.   Basal cell carcinoma 08/14/2008   Right lat. lower thigh. Superficial.    Basal cell carcinoma 04/29/2019   Right mid forearm. Superficial and nodular patterns. EDC.   Basal cell carcinoma 08/17/2020   Right upper antecubital. EDC.   Kidney stones    MS (multiple sclerosis) ([place])    UTI (urinary tract infection)     Past Surgical History:  Procedure Laterality Date   BREAST BIOPSY Right 2010   stereotactic biopsy/ neg/ dr.byrnett   BREAST BIOPSY Left 2012   neg/dr. brynett   CESAREAN SECTION  2008   [facility]   TONSILLECTOMY  2006    There were no vitals filed for this visit.    Subjective Assessment - 05/04/21 1604     Subjective pnt reports hurting her back recently and asking about back stretches    Patient is accompained by: Family member    Pertinent History Patient reports history of falls x3 in the past 6 months secondary to drop foot on the L LE. PMH: Multiple sclerosis; elbow fracture of the R UE    Limitations  Standing;Walking;Lifting    How long can you stand comfortably? 20 min    How long can you walk comfortably? 1 miles    Patient Stated Goals To be more balanced and core strengthening     Currently in Pain? No/denies    Pain Score 0-No pain              Therex Recumbant bike L2 59mins for low level endurance seat 15  Standing dorsiflexion with 5# 3 x 12 Duck walks (heel walks) 25ft 2x supervision for safety  325ft amb 3# AW with good carry over from last session  Obstacle Step over and back 3 x 6-min CGA Seated Ball Roll Out 3 x 30 sec- cueing to  keep hips down               Objective measurements completed on examination: See above findings.                PT Education - 05/04/21 1652     Education Details therex form    Person(s) Educated Patient    Methods Explanation;Demonstration;Verbal cues    Comprehension Verbalized understanding;Returned demonstration  PT Long Term Goals - 02/15/21 1654       PT LONG TERM GOAL #1   Title Patient will be independent with balance HEP to continue benefits of therapy after discharge.    Baseline dependent with form/technique for balance exercise; 03/25/2018: Independent     Time 6    Period Weeks    Status Achieved      PT LONG TERM GOAL #2   Title Patient will improve FGA by 4 points to indicate significant improvement with balancing while ambulating and decrease of fall risk    Baseline 03/25/2018: 28/30; 04/10/2019: 22; 07/21/2019: 29; 09/17/2019: 30/30    Time 6    Period Weeks    Status Achieved      PT LONG TERM GOAL #3   Title Patient will be able to perform prolonged single leg stance >10sec with eyes closed to indicate improvement with static balance and decrease fall risk    Baseline 2 sec in standing EC; 03/25/2018: 3 sec; 04/29/2018: 4 sec; 08/21/2018: 5 sec; 09/19/2018: 6 sec ; 10/21/2018 3 sec; 11/21/2018 5 sec; 01/08/2019: 9 sec; 02/25/2019: 14sec    Time 4    Period Weeks     Status Achieved      PT LONG TERM GOAL #4   Title Pt will improve 6 MWT >1564ft to demonstrate improved cardiorespiratory fitness    Baseline 02/15/21 119ft    Time 8    Period Weeks    Status New      PT LONG TERM GOAL #5   Title Patient will improve FGA by 3 points to indicate significant improvement in fucntioncal gait and balance with walking.    Baseline 08/05/20 22/30; 02/15/21 23    Time 4    Period Weeks    Status On-going      PT LONG TERM GOAL #6   Title Patient will improve L hip flexion strength to 4/5 for improved steppage gait and reduced fall risk.    Baseline 04/10/2019: L hip flexion 3+/5; 07/21/2019: L Hip 4/5    Time 6    Period Weeks    Status Achieved      PT LONG TERM GOAL #7   Title Patient will be able to lift her affected L LE to the same extent as the R LE to allow for greater amount of foot clearance when ambulating    Baseline L LE: 10", R LE: 15"; 11/17/2019: L LE: 14", R LE 17.5"; 01/13/2020: L LE: 15.5", R LE 18"; 12/20: 18" B    Time 8    Period Weeks    Status Achieved      PT LONG TERM GOAL #8   Title Patient will have full dorsiflexion with 5/5 strength to allow for greater toe clearance with the performance of exercises, most notably when walking.    Baseline L 4/5 5 degree ; R 5/5 12 degree; L 4+/5 5 degree ; R 5/5 12 degree    Time 8    Period Weeks    Status Achieved      PT LONG TERM GOAL  #9   TITLE Patient will have no scuffing along L LE with performance of 6 min WT to indicate    Baseline 06/17/2020: No scuffing during today    Time 6    Period Weeks    Status Achieved      PT LONG TERM GOAL  #10   TITLE Patient will improve her ABC by 13% to indicate significant  improvement in confidence with her balance    Baseline 56.25%; 08/05/20 86.2%    Time 6    Period Weeks    Status Achieved      PT LONG TERM GOAL  #11   TITLE Pt will demonstrate L SLS of 21 sec in order to demonstrate symmetrical static balance of unaffected LE  to decrease fall risk    Baseline 02/15/21 R 21 sec; L 7.73    Time 8    Period Weeks    Status New                    Plan - 05/04/21 1653     Clinical Impression Statement spt continued therex progression for increased DF strength and endurance. pnt able to comply to therex cueing and demonstrated increased activity time. pnt maintained good motivation throughout session and requires less cueing. PT will continue as able    Personal Factors and Comorbidities Past/Current Experience    Examination-Activity Limitations Locomotion Level    Examination-Participation Restrictions Community Activity    Stability/Clinical Decision Making Evolving/Moderate complexity    Rehab Potential Good    Clinical Impairments Affecting Rehab Potential (-) hx of MS; (+) good motivation    PT Frequency 2x / week    PT Duration 12 weeks    PT Treatment/Interventions Moist Heat;Iontophoresis 4mg /ml Dexamethasone;Electrical Stimulation;Aquatic Therapy;Cryotherapy;Therapeutic activities;Neuromuscular re-education;Therapeutic exercise;Patient/family education;Dry needling;Manual techniques;Passive range of motion;Balance training;Gait training;Stair training;Joint Manipulations;Prosthetic Training    PT Next Visit Plan Dynamic balance and LE strength exercise    PT Home Exercise Plan hip flexion in sitting for strengthening, hip flexion knee flexion in standing for balance, bike intervals at 70%-75% max HR    Consulted and Agree with Plan of Care Patient             Patient will benefit from skilled therapeutic intervention in order to improve the following deficits and impairments:  Pain, Decreased coordination, Impaired perceived functional ability, Increased fascial restricitons, Increased muscle spasms, Difficulty walking, Abnormal gait, Decreased balance, Decreased mobility, Impaired sensation, Increased edema  Visit Diagnosis: Difficulty in walking, not elsewhere classified  Muscle weakness  (generalized)  History of falling     Problem List There are no problems to display for this patient.   Durwin Reges DPT Claiborne Billings O'Daniel, SPT Durwin Reges, PT 05/05/2021, 1:47 PM  [facility] [address], [place], [address] Phone: [phone]   Fax:  [phone]  Name: Michelle Ruiz MRN: [id number] Date of Birth: 08-02-1970

## 2021-05-10 ENCOUNTER — Encounter: Payer: BC Managed Care – PPO | Admitting: Physical Therapy

## 2021-05-11 ENCOUNTER — Ambulatory Visit: Payer: BC Managed Care – PPO | Admitting: Physical Therapy

## 2021-05-17 ENCOUNTER — Ambulatory Visit: Payer: BC Managed Care – PPO | Admitting: Physical Therapy

## 2021-05-24 ENCOUNTER — Encounter: Payer: BC Managed Care – PPO | Admitting: Physical Therapy

## 2021-05-26 ENCOUNTER — Ambulatory Visit: Payer: BC Managed Care – PPO

## 2021-05-26 ENCOUNTER — Encounter: Payer: BC Managed Care – PPO | Admitting: Physical Therapy

## 2021-06-01 ENCOUNTER — Other Ambulatory Visit: Payer: Self-pay

## 2021-06-01 ENCOUNTER — Ambulatory Visit
Admission: RE | Admit: 2021-06-01 | Discharge: 2021-06-01 | Disposition: A | Discharge status: Home / Self Care | Payer: BC Managed Care – PPO | Source: Ambulatory Visit | Attending: Acute Care | Admitting: Acute Care

## 2021-06-01 ENCOUNTER — Telehealth (INDEPENDENT_AMBULATORY_CARE_PROVIDER_SITE_OTHER): Payer: BC Managed Care – PPO | Admitting: Acute Care

## 2021-06-01 ENCOUNTER — Encounter: Payer: Self-pay | Admitting: Acute Care

## 2021-06-01 DIAGNOSIS — Z87891 Personal history of nicotine dependence: Secondary | ICD-10-CM | POA: Diagnosis present

## 2021-06-01 NOTE — Progress Notes (Signed)
Virtual Visit via Video Note ? ?I connected with Michelle Ruiz on 06/01/21 at  9:00 AM EST by a video enabled telemedicine application and verified that I am speaking with the correct person using two identifiers. ? ?Location: ?Patient:  [name] ?Provider:  [place], [place], [place], [address]  ?  ?I discussed the limitations of evaluation and management by telemedicine and the availability of in person appointments. The patient expressed understanding and agreed to proceed. ? ? ?Shared Decision Making Visit Lung Cancer Screening Program ?([phone]) ? ? ?Eligibility: ?Age 48 y.o. ?Pack Years Smoking History Calculation  50 pack year smoking history ?(# packs/per year x # years smoked) ?Recent History of coughing up blood  no ?Unexplained weight loss? no ?( >Than 15 pounds within the last 6 months ) ?Prior History Lung / other cancer no ?(Diagnosis within the last 5 years already requiring surveillance chest CT Scans). ?Smoking Status Former Smoker ?Former Smokers: Years since quit: 10 years ? Quit Date: 2013 ? ?Visit Components: ?Discussion included one or more decision making aids. yes ?Discussion included risk/benefits of screening. yes ?Discussion included potential follow up diagnostic testing for abnormal scans. yes ?Discussion included meaning and risk of over diagnosis. yes ?Discussion included meaning and risk of False Positives. yes ?Discussion included meaning of total radiation exposure. yes ? ?Counseling Included: ?Importance of adherence to annual lung cancer LDCT screening. yes ?Impact of comorbidities on ability to participate in the program. yes ?Ability and willingness to under diagnostic treatment. yes ? ?Smoking Cessation Counseling: ?Current Smokers:  ?Discussed importance of smoking cessation. yes ?Information about tobacco cessation classes and interventions provided to patient. yes ?Patient provided with "ticket" for LDCT Scan. yes ?Symptomatic Patient. no ? Counseling NA ?Diagnosis  Code: Tobacco Use Z72.0 ?Asymptomatic Patient yes ? Counseling (Intermediate counseling: > three minutes counseling) N9892 ?Former Smokers:  ?Discussed the importance of maintaining cigarette abstinence. yes ?Diagnosis Code: Personal History of Nicotine Dependence. J19.417 ?Information about tobacco cessation classes and interventions provided to patient. Yes ?Patient provided with "ticket" for LDCT Scan. yes ?Written Order for Lung Cancer Screening with LDCT placed in Epic. Yes ?(CT Chest Lung Cancer Screening Low Dose W/O CM) [id number] ?Z12.2-Screening of respiratory organs ?Z87.891-Personal history of nicotine dependence ? ?I spent 25 minutes of face to face time/virtual visit time  with  [name] discussing the risks and benefits of lung cancer screening. We took the time to pause the power point at intervals to allow for questions to be asked and answered to ensure understanding. We discussed that she had taken the single most powerful action possible to decrease her risk of developing lung cancer when she quit smoking. I counseled her to remain smoke free, and to contact me if she ever had the desire to smoke again so that I can provide resources and tools to help support the effort to remain smoke free. We discussed the time and location of the scan, and that either  Doroteo Glassman RN, Joella Prince, RN or I  or I will call / send a letter with the results within  24-72 hours of receiving them. She has the office contact information in the event she needs to speak with me,  she verbalized understanding of all of the above and had no further questions upon leaving the office.  ? ? ? ?I explained to the patient that there has been a high incidence of coronary artery disease noted on these exams. I explained that this is a non-gated exam  therefore degree or severity cannot be determined. This patient is not on statin therapy. I have asked the patient to follow-up with their PCP regarding any incidental finding of  coronary artery disease and management with diet or medication as they feel is clinically indicated. The patient verbalized understanding of the above and had no further questions. ?  ? ? ?Michelle Spatz, NP ?06/01/2021 ? ? ? ? ? ? ?

## 2021-06-01 NOTE — Patient Instructions (Signed)
Thank you for participating in the [facility] Scre[facility]. °It was our pleasure to meet you today. °We will call you with the results of your scan within the next few days. °Your scan will be assigned a Lung RADS category score by the physicians reading the scans.  °This Lung RADS score determines follow up scanning.  °See below for description of categories, and follow up screening recommendations. °We will be in touch to schedule your follow up screening annually or based on recommendations of our providers. °We will fax a copy of your scan results to your Primary Care Physician, or the physician who referred you to the program, to ensure they have the results. °Please call the office if you have any questions or concerns regarding your scanning experience or results.  °Our office number is [phone]. °Please speak with Denise Phelps, RN. She is our Lung Cancer Screening RN. °If she is unavailable when you call, please have the office staff send her a message. She will return your call at her earliest convenience. °Remember, if your scan is normal, we will scan you annually as long as you continue to meet the criteria for the program. (Age 55-77, Current smoker or smoker who has quit within the last 15 years). °If you are a smoker, remember, quitting is the single most powerful action that you can take to decrease your risk of lung cancer and other pulmonary, breathing related problems. °We know quitting is hard, and we are here to help.  °Please let us know if there is anything we can do to help you meet your goal of quitting. °If you are a former smoker, congratulations. We are proud of you! Remain smoke free! °Remember you can refer friends or family members through the number above.  °We will screen them to make sure they meet criteria for the program. °Thank you for helping us take better care of you by participating in Lung Screening. ° °You can receive free nicotine replacement therapy  ( patches, gum or mints) by calling 1-800-QUIT NOW. Please call so we can get you on the path to becoming  a non-smoker. I know it is hard, but you can do this! ° °Lung RADS Categories: ° °Lung RADS 1: no nodules or definitely non-concerning nodules.  °Recommendation is for a repeat annual scan in 12 months. ° °Lung RADS 2:  nodules that are non-concerning in appearance and behavior with a very low likelihood of becoming an active cancer. °Recommendation is for a repeat annual scan in 12 months. ° °Lung RADS 3: nodules that are probably non-concerning , includes nodules with a low likelihood of becoming an active cancer.  Recommendation is for a 6-month repeat screening scan. Often noted after an upper respiratory illness. We will be in touch to make sure you have no questions, and to schedule your 6-month scan. ° °Lung RADS 4 A: nodules with concerning findings, recommendation is most often for a follow up scan in 3 months or additional testing based on our provider's assessment of the scan. We will be in touch to make sure you have no questions and to schedule the recommended 3 month follow up scan. ° °Lung RADS 4 B:  indicates findings that are concerning. We will be in touch with you to schedule additional diagnostic testing based on our provider's  assessment of the scan. ° °Hypnosis for smoking cessation  °[employer] °[phone] ° °Acupuncture for smoking cessation  °[facility] °[phone]  °

## 2021-06-03 ENCOUNTER — Other Ambulatory Visit: Payer: Self-pay | Admitting: Acute Care

## 2021-06-03 DIAGNOSIS — Z87891 Personal history of nicotine dependence: Secondary | ICD-10-CM

## 2021-06-15 ENCOUNTER — Other Ambulatory Visit: Payer: Self-pay

## 2021-06-15 ENCOUNTER — Ambulatory Visit: Payer: BC Managed Care – PPO | Attending: Neurology

## 2021-06-15 VITALS — BP 153/83 | HR 108

## 2021-06-15 DIAGNOSIS — M6281 Muscle weakness (generalized): Secondary | ICD-10-CM

## 2021-06-15 DIAGNOSIS — R262 Difficulty in walking, not elsewhere classified: Secondary | ICD-10-CM

## 2021-06-15 DIAGNOSIS — Z9181 History of falling: Secondary | ICD-10-CM | POA: Diagnosis present

## 2021-06-15 NOTE — Therapy (Signed)
[facility] ?[address] ?[place], [place], [address] ?Phone: [phone]   Fax:  [phone] ? ?Physical Therapy Treatment/Reassessment  ? ?Patient Details  ?Name: Michelle Ruiz ?MRN: [id number] ?Date of Birth: May 07, 1970 ?No data recorded ? ?Encounter Date: 06/15/2021 ? ? PT End of Session - 06/15/21 1841   ? ? Visit Number 1   ? Number of Visits 16   ? Date for PT Re-Evaluation 09/07/21   ? Authorization Type [employer] COMM Pro   ? Authorization Time Period 06/15/21-09/07/21   ? Progress Note Due on Visit 10   ? PT Start Time 3267   ? PT Stop Time 1245   ? PT Time Calculation (min) 40 min   ? Equipment Utilized During Treatment Gait belt   ? Activity Tolerance Patient tolerated treatment well;No increased pain;Patient limited by fatigue   ? Behavior During Therapy [facility] for tasks assessed/performed   ? ?  ?  ? ?  ? ? ?Past Medical History:  ?Diagnosis Date  ? Abnormal Pap smear of cervix   ? Actinic keratosis   ? Anxiety   ? Basal cell carcinoma 04/14/2008  ? Right upper ant. thigh. BCC with sclerosis.  ? Basal cell carcinoma 08/14/2008  ? Right lat. lower thigh. Superficial.   ? Basal cell carcinoma 04/29/2019  ? Right mid forearm. Superficial and nodular patterns. EDC.  ? Basal cell carcinoma 08/17/2020  ? Right upper antecubital. EDC.  ? Kidney stones   ? MS (multiple sclerosis) ([place])   ? UTI (urinary tract infection)   ? ? ?Past Surgical History:  ?Procedure Laterality Date  ? BREAST BIOPSY Right 2010  ? stereotactic biopsy/ neg/ dr.byrnett  ? BREAST BIOPSY Left 2012  ? neg/dr. brynett  ? CESAREAN SECTION  2008   [facility]  ? TONSILLECTOMY  2006  ? ? ?Vitals:  ? 06/15/21 1636  ?BP: (!) 153/83  ?Pulse: (!) 108  ?SpO2: 99%  ? ? ?-5 MINUTES WU on recumbent bike, seat 15, level 3- vitals checked before and after. Moderate DOE during activity after 1 minute ?-6MWT, gait belt, no device, 1184f, Left steppage gait to mediate limited foot clearance and foot  drop ?-Left ankle DF woth Spart trainer 2x10, unloaded; Rt 1x10 At 2.5lb  ? ? ? ? ? ? ? PT Education - 06/15/21 1846   ? ? Education Details need to monitor BP at home, contact PCP as needed   ? Person(s) Educated Patient   ? Methods Explanation;Demonstration   ? Comprehension Verbalized understanding;Returned demonstration   ? ?  ?  ? ?  ? ? ? ? ? ? PT Long Term Goals - 06/15/21 1853   ? ?  ? PT LONG TERM GOAL #1  ? Title Patient will be independent with balance HEP to maximize safety in ADL, IADL, and fitness activity   ? Baseline walking only   ? Time 4   ? Period Weeks   ? Status New   ? Target Date 07/13/21   ?  ? PT LONG TERM GOAL #2  ? Title Pt to demonstrate improvement in minibesTest >3 points to demonstrate improved posture control and reduced falls risk.   ? Baseline pending to visit 2   ? Time 5   ? Period Weeks   ? Status New   ? Target Date 07/20/21   ?  ? PT LONG TERM GOAL #3  ? Title Pt to perform SLS >15 sec bilat to indicate  improved single limb motor control in gait.   ? Baseline 02/25/2019: 14sec (retest deferred to visit 2)   ? Time 6   ? Period Weeks   ? Status New   ? Target Date 07/27/21   ?  ? PT LONG TERM GOAL #4  ? Title Pt will improve 6 MWT >1545f to demonstrate improved cardiorespiratory fitness   ? Baseline (02/15/21 11045f 06/15/21: 110058fgait belt, no AD)   ? Time 7   ? Period Weeks   ? Status New   ? Target Date 08/03/21   ?  ? PT LONG TERM GOAL #5  ? Title Pt to demonstrate 30sec chair rise >13 to indicate improved power in BLE.   ? Baseline defer to visit 2   ? Time 8   ? Period Weeks   ? Status New   ? Target Date 08/10/21   ? ?  ?  ? ?  ? ? ? ? ? ? ? ? Plan - 06/15/21 1846   ? ? Clinical Impression Statement Pt returning after 4 weeks absence 2/2 to illness related to covid infection. Pt has recovered nearly fully with lingering limitations in exertion with sustained aerobic efforts. Pt perfoms 1100f21f 6MWT which is slightly below her more recent improvements. Continued with  ankle DF strengthening to improve left toe clearance in swing phase of gait cycle and reduce steppage tendencies which are less ergonomic. BP is consistently elevated in session, pt informed and advised. Pt will conitnue to benefit from skilled PT intervention to improve acitivty tolerance, reduce falls risk, and maximize safety/independence in IADL and fitness activity.   ? Personal Factors and Comorbidities Past/Current Experience;Fitness;Behavior Pattern   ? Examination-Activity Limitations Locomotion Level;Transfers;Bed Mobility;Stand;Squat   ? Examination-Participation Restrictions Community Activity;[facility]aver[facility]   ? Stability/Clinical Decision Making Evolving/Moderate complexity   ? Clinical Decision Making Moderate   ? Rehab Potential Good   ? PT Frequency 2x / week   ? PT Duration 12 weeks   ? PT Treatment/Interventions Iontophoresis '4mg'$ /ml Dexamethasone;Electrical Stimulation;Aquatic Therapy;Cryotherapy;Therapeutic activities;Neuromuscular re-education;Therapeutic exercise;Patient/family education;Dry needling;Manual techniques;Passive range of motion;Balance training;Gait training;Stair training;Joint Manipulations;Functional mobility training   ? PT Next Visit Plan review SLS, other reassessment measures   ? PT Home Exercise Plan walking for fitness (could use something for balance practice at home)   ? Consulted and Agree with Plan of Care Patient   ? ?  ?  ? ?  ? ? ?Patient will benefit from skilled therapeutic intervention in order to improve the following deficits and impairments:  Pain, Decreased coordination, Impaired perceived functional ability, Increased fascial restricitons, Increased muscle spasms, Difficulty walking, Abnormal gait, Decreased balance, Decreased mobility, Impaired sensation, Increased edema, Cardiopulmonary status limiting activity ? ?Visit Diagnosis: ?Difficulty in walking, not elsewhere classified ? ?Muscle weakness (generalized) ? ?History of falling ? ? ? ? ?Problem List ?There  are no problems to display for this patient. ?7:03 PM, 06/15/21 ?AllaEtta Grandchild, DPT ?Physical Therapist - [phone]) ? ? ?Lyliana Dicenso C, PT ?06/15/2021, 7:00 PM ? ?[facility] ?[address], [place]: [phone]:  [phone]: Michelle Ruiz: [id number] of Birth: 10/1317-Nov-1972 ? ?

## 2021-06-30 ENCOUNTER — Ambulatory Visit: Payer: BC Managed Care – PPO | Attending: Neurology

## 2021-06-30 DIAGNOSIS — R262 Difficulty in walking, not elsewhere classified: Secondary | ICD-10-CM | POA: Diagnosis present

## 2021-06-30 DIAGNOSIS — M25621 Stiffness of right elbow, not elsewhere classified: Secondary | ICD-10-CM | POA: Diagnosis present

## 2021-06-30 DIAGNOSIS — Z9181 History of falling: Secondary | ICD-10-CM | POA: Insufficient documentation

## 2021-06-30 DIAGNOSIS — M25521 Pain in right elbow: Secondary | ICD-10-CM | POA: Insufficient documentation

## 2021-06-30 DIAGNOSIS — M6281 Muscle weakness (generalized): Secondary | ICD-10-CM | POA: Insufficient documentation

## 2021-06-30 NOTE — Therapy (Signed)
?[facility] ?[address] ?[place], [place], [address] ?Phone: [phone]   Fax:  [phone] ? ?Physical Therapy Treatment ? ?Patient Details  ?Name: Michelle Ruiz ?MRN: [id number] ?Date of Birth: 26-Oct-1970 ?No data recorded ? ?Encounter Date: 06/30/2021 ? ? PT End of Session - 06/30/21 1618   ? ? Visit Number 2   ? Number of Visits 16   ? Date for PT Re-Evaluation 09/07/21   ? Authorization Type BCBS COMM Pro   ? Authorization Time Period 06/15/21-09/07/21   ? Progress Note Due on Visit 10   ? PT Start Time 3662   ? PT Stop Time 9476   ? PT Time Calculation (min) 40 min   ? Equipment Utilized During Treatment Gait belt   ? Activity Tolerance Patient tolerated treatment well;No increased pain;Patient limited by fatigue   ? Behavior During Therapy [facility] for tasks assessed/performed   ? ?  ?  ? ?  ? ? ?Past Medical History:  ?Diagnosis Date  ? Abnormal Pap smear of cervix   ? Actinic keratosis   ? Anxiety   ? Basal cell carcinoma 04/14/2008  ? Right upper ant. thigh. BCC with sclerosis.  ? Basal cell carcinoma 08/14/2008  ? Right lat. lower thigh. Superficial.   ? Basal cell carcinoma 04/29/2019  ? Right mid forearm. Superficial and nodular patterns. EDC.  ? Basal cell carcinoma 08/17/2020  ? Right upper antecubital. EDC.  ? Kidney stones   ? MS (multiple sclerosis) ([place])   ? UTI (urinary tract infection)   ? ? ?Past Surgical History:  ?Procedure Laterality Date  ? BREAST BIOPSY Right 2010  ? stereotactic biopsy/ neg/ dr.byrnett  ? BREAST BIOPSY Left 2012  ? neg/dr. brynett  ? CESAREAN SECTION  2008   RPH  ? TONSILLECTOMY  2006  ? ? ?There were no vitals filed for this visit. ? ? Subjective Assessment - 06/30/21 1611   ? ? Subjective Pt doing well today in general, FU with PCP regarding BP and has a new PRN antihypertensive that she has only needed to take 1 time. Pt monitors daily. PT has been working on progressing walking daily.   ? Pertinent History  Patient reports history of falls x3 in the past 6 months secondary to drop foot on the L LE. PMH: Multiple sclerosis; elbow fracture of the R UE   ? Patient Stated Goals To be more balanced and core strengthening    ? Currently in Pain? No/denies   ? ?  ?  ? ?  ? ? ?INTERVENTION THIS DATE:  ?-Recumbent bike WU x4 minutes, seat 15; HIIT training seat 13 30 sec @ level 7 85-95% effort, 90 sec easy recovery level 2 (repeated 3x)  ?-Marching 1x10 bilat c 5lb  ?-STS hands free 1x10  ?-Marching 1x10 bilat c 5lb  ?-STS hands free 1x10  ?-Standing big hedgehog taps alternating 1x20 in // bars  ? ?-education on safe progression of HIIT training at home 1-2x weekly ?-education on safe progression of walking workouts- "don't work on distance and speed on the same days" ?-recommended use of simple dynamic AFO to make faster walking more safe for workouts  ? ? PT Education - 06/30/21 1618   ? ? Education Details educated on HIIT training   ? Person(s) Educated Patient   ? Methods Explanation;Demonstration   ? Comprehension Verbalized understanding   ? ?  ?  ? ?  ? ? ? ? ? ?  PT Long Term Goals - 06/15/21 1853   ? ?  ? PT LONG TERM GOAL #1  ? Title Patient will be independent with balance HEP to maximize safety in ADL, IADL, and fitness activity   ? Baseline walking only   ? Time 4   ? Period Weeks   ? Status New   ? Target Date 07/13/21   ?  ? PT LONG TERM GOAL #2  ? Title Pt to demonstrate improvement in minibesTest >3 points to demonstrate improved posture control and reduced falls risk.   ? Baseline pending to visit 2   ? Time 5   ? Period Weeks   ? Status New   ? Target Date 07/20/21   ?  ? PT LONG TERM GOAL #3  ? Title Pt to perform SLS >15 sec bilat to indicate improved single limb motor control in gait.   ? Baseline 02/25/2019: 14sec (retest deferred to visit 2)   ? Time 6   ? Period Weeks   ? Status New   ? Target Date 07/27/21   ?  ? PT LONG TERM GOAL #4  ? Title Pt will improve 6 MWT >1576f to demonstrate improved  cardiorespiratory fitness   ? Baseline (02/15/21 11062f 06/15/21: 11005fgait belt, no AD)   ? Time 7   ? Period Weeks   ? Status New   ? Target Date 08/03/21   ?  ? PT LONG TERM GOAL #5  ? Title Pt to demonstrate 30sec chair rise >13 to indicate improved power in BLE.   ? Baseline defer to visit 2   ? Time 8   ? Period Weeks   ? Status New   ? Target Date 08/10/21   ? ?  ?  ? ?  ? ? ? ? ? ? ? ? Plan - 06/30/21 1620   ? ? Clinical Impression Statement BP issues better controlled. Bike for CV fitness and HEP training. Hip strengthening advanced. Dynamic balance at end of session. Pt to trial fast AMB trials next session with TB assist at ankle.   ? Personal Factors and Comorbidities Past/Current Experience;Fitness;Behavior Pattern   ? Examination-Activity Limitations Locomotion Level;Transfers;Bed Mobility;Stand;Squat   ? Examination-Participation Restrictions Community Activity;YarValla Leaverrk   ? Stability/Clinical Decision Making Evolving/Moderate complexity   ? Clinical Decision Making Moderate   ? Rehab Potential Good   ? Clinical Impairments Affecting Rehab Potential (-) hx of MS; (+) good motivation   ? PT Frequency 2x / week   ? PT Duration 12 weeks   ? PT Treatment/Interventions Iontophoresis '4mg'$ /ml Dexamethasone;Electrical Stimulation;Aquatic Therapy;Cryotherapy;Therapeutic activities;Neuromuscular re-education;Therapeutic exercise;Patient/family education;Dry needling;Manual techniques;Passive range of motion;Balance training;Gait training;Stair training;Joint Manipulations;Functional mobility training   ? PT Next Visit Plan review SLS, other reassessment measures   ? PT Home Exercise Plan walking for fitness (could use something for balance practice at home)   ? Consulted and Agree with Plan of Care Patient   ? ?  ?  ? ?  ? ? ?Patient will benefit from skilled therapeutic intervention in order to improve the following deficits and impairments:  Pain, Decreased coordination, Impaired perceived functional  ability, Increased fascial restricitons, Increased muscle spasms, Difficulty walking, Abnormal gait, Decreased balance, Decreased mobility, Impaired sensation, Increased edema, Cardiopulmonary status limiting activity ? ?Visit Diagnosis: ?Difficulty in walking, not elsewhere classified ? ?Muscle weakness (generalized) ? ?History of falling ? ? ? ? ?Problem List ?There are no problems to display for this patient. ? ?4:57 PM, 06/30/21 ?AllEtta Grandchild  PT, DPT ?Physical Therapist - [place] ?[phone] (Office) ? ? ?Francely Craw C, PT ?06/30/2021, 4:21 PM ? ?[facility] ?[address] ?[place], [place], [address] ?Phone: [phone]   Fax:  [phone] ? ?Name: Michelle Ruiz ?MRN: [id number] ?Date of Birth: 1970-11-16 ? ? ? ?

## 2021-07-05 ENCOUNTER — Encounter: Payer: BC Managed Care – PPO | Admitting: Physical Therapy

## 2021-07-07 ENCOUNTER — Ambulatory Visit: Payer: BC Managed Care – PPO

## 2021-07-07 DIAGNOSIS — R262 Difficulty in walking, not elsewhere classified: Secondary | ICD-10-CM | POA: Diagnosis not present

## 2021-07-07 DIAGNOSIS — Z9181 History of falling: Secondary | ICD-10-CM

## 2021-07-07 DIAGNOSIS — M6281 Muscle weakness (generalized): Secondary | ICD-10-CM

## 2021-07-07 NOTE — Therapy (Signed)
[facility] ?[address] ?[place], [place], [address] ?Phone: [phone]   Fax:  [phone] ? ?Physical Therapy Treatment ? ?Patient Details  ?Name: Michelle Ruiz ?MRN: [id number] ?Date of Birth: 1970-09-22 ?No data recorded ? ?Encounter Date: 07/07/2021 ? ? PT End of Session - 07/07/21 1629   ? ? Visit Number 3   ? Number of Visits 16   ? Date for PT Re-Evaluation 09/07/21   ? Authorization Type BCBS COMM Pro   ? Authorization Time Period 06/15/21-09/07/21   ? Progress Note Due on Visit 10   ? PT Start Time 1600   ? PT Stop Time 1640   ? PT Time Calculation (min) 40 min   ? Activity Tolerance Patient tolerated treatment well;No increased pain;Patient limited by fatigue   ? Behavior During Therapy [facility] for tasks assessed/performed   ? ?  ?  ? ?  ? ? ?Past Medical History:  ?Diagnosis Date  ? Abnormal Pap smear of cervix   ? Actinic keratosis   ? Anxiety   ? Basal cell carcinoma 04/14/2008  ? Right upper ant. thigh. BCC with sclerosis.  ? Basal cell carcinoma 08/14/2008  ? Right lat. lower thigh. Superficial.   ? Basal cell carcinoma 04/29/2019  ? Right mid forearm. Superficial and nodular patterns. EDC.  ? Basal cell carcinoma 08/17/2020  ? Right upper antecubital. EDC.  ? Kidney stones   ? MS (multiple sclerosis) ([place])   ? UTI (urinary tract infection)   ? ? ?Past Surgical History:  ?Procedure Laterality Date  ? BREAST BIOPSY Right 2010  ? stereotactic biopsy/ neg/ dr.byrnett  ? BREAST BIOPSY Left 2012  ? neg/dr. brynett  ? CESAREAN SECTION  2008   RPH  ? TONSILLECTOMY  2006  ? ? ?There were no vitals filed for this visit. ? ? Subjective Assessment - 07/07/21 1605   ? ? Subjective Pt doing well in general. Tried her HIT workout on bike and was pleased. She has continued to work on her walking fitness. BP has remains well controlled.   ? Pertinent History Patient reports history of falls x3 in the past 6 months secondary to drop foot on the L LE. PMH:  Multiple sclerosis; elbow fracture of the R UE   ? Currently in Pain? No/denies   ? ?  ?  ? ?  ? ? ?INTERVENTION THIS DATE: ?-Nustep AA/ROM 4 minutes x level 2 ?-Overground AMB c yellow TB over foot for DF assist x293f, no enough support ?-Overground AMB c Green TB over foot for DF assist x501f ? ?C Green Ankle DF assist: lateral side stepping 2x2076filat, 2x retro AMB  ?-adequate assist for foot clearance throughout, pt reports improved perception of gait stability and ergonomics ? ?-seated Left ankle DF 1x15 @ 2lbAW ?-seated Left ankle IV in fig 4 @ 2lb AW 1x15 ?-Rt SLS 1x30sec ?-normal stance eyes closed x30sec  ?-seated Left ankle DF 1x15 @ 2lbAW ?-seated Left ankle IV in fig 4 @ 2lb AW 1x15 ? ?-Airex normal stance c overhead rebounding x15, ball slams x15, self toss/catch x15 ? ?----------------------------------------------------- ? ?INTERVENTION 06/30/21:  ?-Recumbent bike WU x4 minutes, seat 15; HIIT training seat 13 30 sec @ level 7 85-95% effort, 90 sec easy recovery level 2 (repeated 3x)  ?-Marching 1x10 bilat c 5lb  ?-STS hands free 1x10  ?-Marching 1x10 bilat c 5lb  ?-STS hands free 1x10  ?-Standing big hedgehog taps alternating 1x20 in //  bars  ?  ?-education on safe progression of HIIT training at home 1-2x weekly ?-education on safe progression of walking workouts- "don't work on distance and speed on the same days" ?-recommended use of simple dynamic AFO to make faster walking more safe for workouts  ? ?------------------------------------------------------------ ?INTERVENTION 06/15/21 ?5 MINUTES WU on recumbent bike, seat 15, level 3- vitals checked before and after. Moderate DOE during activity after 1 minute ?-6MWT, gait belt, no device, 1162f, Left steppage gait to mediate limited foot clearance and foot drop ?-Left ankle DF woth Spart trainer 2x10, unloaded; Rt 1x10 At 2.5lb  ? ? ? ? PT Education - 07/07/21 1631   ? ? Education Details benefit of safety with   ? Person(s) Educated Patient   ?  Methods Explanation;Demonstration   ? Comprehension Verbalized understanding   ? ?  ?  ? ?  ? ? ? ? ? ? PT Long Term Goals - 06/15/21 1853   ? ?  ? PT LONG TERM GOAL #1  ? Title Patient will be independent with balance HEP to maximize safety in ADL, IADL, and fitness activity   ? Baseline walking only   ? Time 4   ? Period Weeks   ? Status New   ? Target Date 07/13/21   ?  ? PT LONG TERM GOAL #2  ? Title Pt to demonstrate improvement in minibesTest >3 points to demonstrate improved posture control and reduced falls risk.   ? Baseline pending to visit 2   ? Time 5   ? Period Weeks   ? Status New   ? Target Date 07/20/21   ?  ? PT LONG TERM GOAL #3  ? Title Pt to perform SLS >15 sec bilat to indicate improved single limb motor control in gait.   ? Baseline 02/25/2019: 14sec (retest deferred to visit 2)   ? Time 6   ? Period Weeks   ? Status New   ? Target Date 07/27/21   ?  ? PT LONG TERM GOAL #4  ? Title Pt will improve 6 MWT >15043fto demonstrate improved cardiorespiratory fitness   ? Baseline (02/15/21 110078f3/22/23: 1100f28fait belt, no AD)   ? Time 7   ? Period Weeks   ? Status New   ? Target Date 08/03/21   ?  ? PT LONG TERM GOAL #5  ? Title Pt to demonstrate 30sec chair rise >13 to indicate improved power in BLE.   ? Baseline defer to visit 2   ? Time 8   ? Period Weeks   ? Status New   ? Target Date 08/10/21   ? ?  ?  ? ?  ? ? ? ? ? ? ? ? Plan - 07/07/21 1633   ? ? Clinical Impression Statement New HIT training for HEP going well, asked pt to gradually increase from 2 to 5 repeats over next couple weeks. Success with theraband DF assist on Left, pt interested in obtaining one for advanced gait activity, prolonged walking, and days with greater AMB need. Finished with ankle strengthening and motor control training. Author will review available AFO devices and recommend one that might best meet her needs.   ? Personal Factors and Comorbidities Past/Current Experience;Fitness;Behavior Pattern   ?  Examination-Activity Limitations Locomotion Level;Transfers;Bed Mobility;Stand;Squat   ? Examination-Participation Restrictions Community Activity;YardValla Leaverk   ? Stability/Clinical Decision Making Evolving/Moderate complexity   ? Clinical Decision Making Moderate   ? Rehab Potential Good   ?  Clinical Impairments Affecting Rehab Potential (-) hx of MS; (+) good motivation   ? PT Frequency 2x / week   ? PT Duration 12 weeks   ? PT Treatment/Interventions Iontophoresis '4mg'$ /ml Dexamethasone;Electrical Stimulation;Aquatic Therapy;Cryotherapy;Therapeutic activities;Neuromuscular re-education;Therapeutic exercise;Patient/family education;Dry needling;Manual techniques;Passive range of motion;Balance training;Gait training;Stair training;Joint Manipulations;Functional mobility training   ? PT Next Visit Plan review SLS, other reassessment measures   ? PT Home Exercise Plan walking for fitness (could use something for balance practice at home)   ? Consulted and Agree with Plan of Care Patient   ? ?  ?  ? ?  ? ? ?Patient will benefit from skilled therapeutic intervention in order to improve the following deficits and impairments:  Pain, Decreased coordination, Impaired perceived functional ability, Increased fascial restricitons, Increased muscle spasms, Difficulty walking, Abnormal gait, Decreased balance, Decreased mobility, Impaired sensation, Increased edema, Cardiopulmonary status limiting activity ? ?Visit Diagnosis: ?Difficulty in walking, not elsewhere classified ? ?Muscle weakness (generalized) ? ?History of falling ? ? ? ? ?Problem List ?There are no problems to display for this patient. ? ?4:54 PM, 07/07/21 ?Etta Grandchild, PT, DPT ?Physical Therapist - [place] ?[phone] (Office) ? ? ?Audrena Talaga C, PT ?07/07/2021, 4:35 PM ? ?[facility] ?[address] ?[place], [place], [address] ?Phone: [phone]   Fax:  [phone] ? ?Name: ALANEE TING ?MRN: [id number] ?Date of Birth: April 08, 1970 ? ? ? ?

## 2021-07-14 ENCOUNTER — Encounter: Payer: Self-pay | Admitting: Physical Therapy

## 2021-07-14 ENCOUNTER — Ambulatory Visit: Payer: BC Managed Care – PPO | Admitting: Physical Therapy

## 2021-07-14 DIAGNOSIS — R262 Difficulty in walking, not elsewhere classified: Secondary | ICD-10-CM | POA: Diagnosis not present

## 2021-07-14 DIAGNOSIS — Z9181 History of falling: Secondary | ICD-10-CM

## 2021-07-14 DIAGNOSIS — M25621 Stiffness of right elbow, not elsewhere classified: Secondary | ICD-10-CM

## 2021-07-14 DIAGNOSIS — M6281 Muscle weakness (generalized): Secondary | ICD-10-CM

## 2021-07-14 DIAGNOSIS — M25521 Pain in right elbow: Secondary | ICD-10-CM

## 2021-07-14 NOTE — Therapy (Signed)
[facility] ?[address] ?[place], [place], [address] ?Phone: [phone]   Fax:  [phone] ? ?Physical Therapy Treatment ? ?Patient Details  ?Name: Michelle Ruiz ?MRN: [id number] ?Date of Birth: 1970/05/02 ?No data recorded ? ?Encounter Date: 07/14/2021 ? ? PT End of Session - 07/14/21 1639   ? ? Visit Number 4   ? Number of Visits 16   ? Date for PT Re-Evaluation 09/07/21   ? Authorization Type BCBS COMM Pro   ? Authorization Time Period 06/15/21-09/07/21   ? Progress Note Due on Visit 10   ? PT Start Time 1434   ? PT Stop Time 1515   ? PT Time Calculation (min) 41 min   ? Activity Tolerance Patient tolerated treatment well;No increased pain;Patient limited by fatigue   ? Behavior During Therapy [facility] for tasks assessed/performed   ? ?  ?  ? ?  ? ? ?Past Medical History:  ?Diagnosis Date  ? Abnormal Pap smear of cervix   ? Actinic keratosis   ? Anxiety   ? Basal cell carcinoma 04/14/2008  ? Right upper ant. thigh. BCC with sclerosis.  ? Basal cell carcinoma 08/14/2008  ? Right lat. lower thigh. Superficial.   ? Basal cell carcinoma 04/29/2019  ? Right mid forearm. Superficial and nodular patterns. EDC.  ? Basal cell carcinoma 08/17/2020  ? Right upper antecubital. EDC.  ? Kidney stones   ? MS (multiple sclerosis) ([place])   ? UTI (urinary tract infection)   ? ? ?Past Surgical History:  ?Procedure Laterality Date  ? BREAST BIOPSY Right 2010  ? stereotactic biopsy/ neg/ dr.byrnett  ? BREAST BIOPSY Left 2012  ? neg/dr. brynett  ? CESAREAN SECTION  2008   RPH  ? TONSILLECTOMY  2006  ? ? ?There were no vitals filed for this visit. ? ? Subjective Assessment - 07/14/21 1437   ? ? Subjective Pt states she is doing well today. Has not ordered a brace yet. States she has been compliant with interval training on bike and walking.   ? Pertinent History Patient reports history of falls x3 in the past 6 months secondary to drop foot on the L LE. PMH: Multiple sclerosis;  elbow fracture of the R UE   ? Currently in Pain? No/denies   ? ?  ?  ? ?  ? ? ? ? ? ?INTERVENTION THIS DATE: ?-Recumbent bike 3 minutes at level 3 ?-Recumbent bike interval training 30 seconds level 7, 60 seconds level 3 with consistent RPM 55-60 throughout. x3 sets. HR max 118.  ? ?Education via demonstration and performance on how to apply Green TB over foot for DF assist for home use/walks until she orders a brace.  ? ?SLS RLE x30 seconds  ?Weight shift to LLE with right toe down 3x30 seconds with attempts to lift RLE for SLS.   ? ?-squats with tactile feedback for depth (thick airex pad on chair) 2x8 ?-lateral side stepping 2x10 steps BLE ?-standing hip extension 2x20 BLE ? ?  ? ? ?Clinical Impression: Pt is pleasant and motivated within session. Session focused on general LE strengthening, standing stability and endurance. Pt was taught how to apply resistance band to left ankle for DF assist. Seated rest breaks taken as pt felt appropriate as to not exacerbate MS symptoms. Pt will continue to benefit from continued PT to address limitations in strength, endurance and balance for improved QOL and participation in health/fitness and community events.  ? ? ? ? ? ? ? ? ?  PT Long Term Goals - 06/15/21 1853   ? ?  ? PT LONG TERM GOAL #1  ? Title Patient will be independent with balance HEP to maximize safety in ADL, IADL, and fitness activity   ? Baseline walking only   ? Time 4   ? Period Weeks   ? Status New   ? Target Date 07/13/21   ?  ? PT LONG TERM GOAL #2  ? Title Pt to demonstrate improvement in minibesTest >3 points to demonstrate improved posture control and reduced falls risk.   ? Baseline pending to visit 2   ? Time 5   ? Period Weeks   ? Status New   ? Target Date 07/20/21   ?  ? PT LONG TERM GOAL #3  ? Title Pt to perform SLS >15 sec bilat to indicate improved single limb motor control in gait.   ? Baseline 02/25/2019: 14sec (retest deferred to visit 2)   ? Time 6   ? Period Weeks   ? Status New   ?  Target Date 07/27/21   ?  ? PT LONG TERM GOAL #4  ? Title Pt will improve 6 MWT >1533f to demonstrate improved cardiorespiratory fitness   ? Baseline (02/15/21 11036f 06/15/21: 110075fgait belt, no AD)   ? Time 7   ? Period Weeks   ? Status New   ? Target Date 08/03/21   ?  ? PT LONG TERM GOAL #5  ? Title Pt to demonstrate 30sec chair rise >13 to indicate improved power in BLE.   ? Baseline defer to visit 2   ? Time 8   ? Period Weeks   ? Status New   ? Target Date 08/10/21   ? ?  ?  ? ?  ? ? ? ? ? ? ? ? Plan - 07/14/21 1648   ? ? Clinical Impression Statement Pt is pleasant and motivated within session. Session focused on general LE strengthening, standing stability and endurance. Pt was taught how to apply resistance band to left ankle for DF assist. Seated rest breaks taken as pt felt appropriate as to not exacerbate MS symptoms. Pt will continue to benefit from continued PT to address limitations in strength, endurance and balance for improved QOL and participation in health/fitness and community events.   ? Personal Factors and Comorbidities Past/Current Experience;Fitness;Behavior Pattern   ? Examination-Activity Limitations Locomotion Level;Transfers;Bed Mobility;Stand;Squat   ? Examination-Participation Restrictions Community Activity;YarValla Leaverrk   ? Stability/Clinical Decision Making Evolving/Moderate complexity   ? Rehab Potential Good   ? Clinical Impairments Affecting Rehab Potential (-) hx of MS; (+) good motivation   ? PT Frequency 2x / week   ? PT Duration 12 weeks   ? PT Treatment/Interventions Iontophoresis '4mg'$ /ml Dexamethasone;Electrical Stimulation;Aquatic Therapy;Cryotherapy;Therapeutic activities;Neuromuscular re-education;Therapeutic exercise;Patient/family education;Dry needling;Manual techniques;Passive range of motion;Balance training;Gait training;Stair training;Joint Manipulations;Functional mobility training   ? PT Next Visit Plan review SLS, other reassessment measures   ? PT Home  Exercise Plan walking for fitness (could use something for balance practice at home)   ? Consulted and Agree with Plan of Care Patient   ? ?  ?  ? ?  ? ? ?Patient will benefit from skilled therapeutic intervention in order to improve the following deficits and impairments:  Pain, Decreased coordination, Impaired perceived functional ability, Increased fascial restricitons, Increased muscle spasms, Difficulty walking, Abnormal gait, Decreased balance, Decreased mobility, Impaired sensation, Increased edema, Cardiopulmonary status limiting activity ? ?Visit Diagnosis: ?Difficulty in walking, not elsewhere  classified ? ?Muscle weakness (generalized) ? ?History of falling ? ?Pain in right elbow ? ?Stiffness of right elbow, not elsewhere classified ? ? ? ? ?Problem List ?There are no problems to display for this patient. ? ? ?Patrina Levering PT, DPT ? ? ?[place] ?[facility] ?[address] ?[place], [place], [address] ?Phone: [phone]   Fax:  [phone] ? ?Name: Michelle Ruiz ?MRN: [id number] ?Date of Birth: Aug 06, 1970 ? ? ? ?

## 2021-07-18 ENCOUNTER — Ambulatory Visit: Payer: BC Managed Care – PPO | Admitting: Physical Therapy

## 2021-07-18 ENCOUNTER — Encounter: Payer: Self-pay | Admitting: Physical Therapy

## 2021-07-18 DIAGNOSIS — R262 Difficulty in walking, not elsewhere classified: Secondary | ICD-10-CM | POA: Diagnosis not present

## 2021-07-18 DIAGNOSIS — M6281 Muscle weakness (generalized): Secondary | ICD-10-CM

## 2021-07-18 DIAGNOSIS — Z9181 History of falling: Secondary | ICD-10-CM

## 2021-07-18 NOTE — Therapy (Signed)
[place] [facility] ?[address] ?[place], [place], [address] ?Phone: [phone]   Fax:  [phone] ? ?Physical Therapy Treatment ? ?Patient Details  ?Name: Michelle Ruiz ?MRN: [id number] ?Date of Birth: Aug 09, 1970 ?No data recorded ? ?Encounter Date: 07/18/2021 ? ? PT End of Session - 07/18/21 1521   ? ? Visit Number 5   ? Number of Visits 16   ? Date for PT Re-Evaluation 09/07/21   ? Authorization Type BCBS COMM Pro   ? Authorization Time Period 06/15/21-09/07/21   ? Progress Note Due on Visit 10   ? PT Start Time 1025   ? PT Stop Time 8527   ? PT Time Calculation (min) 41 min   ? Activity Tolerance Patient tolerated treatment well;No increased pain;Patient limited by fatigue   ? Behavior During Therapy [facility] for tasks assessed/performed   ? ?  ?  ? ?  ? ? ?Past Medical History:  ?Diagnosis Date  ? Abnormal Pap smear of cervix   ? Actinic keratosis   ? Anxiety   ? Basal cell carcinoma 04/14/2008  ? Right upper ant. thigh. BCC with sclerosis.  ? Basal cell carcinoma 08/14/2008  ? Right lat. lower thigh. Superficial.   ? Basal cell carcinoma 04/29/2019  ? Right mid forearm. Superficial and nodular patterns. EDC.  ? Basal cell carcinoma 08/17/2020  ? Right upper antecubital. EDC.  ? Kidney stones   ? MS (multiple sclerosis) ([place])   ? UTI (urinary tract infection)   ? ? ?Past Surgical History:  ?Procedure Laterality Date  ? BREAST BIOPSY Right 2010  ? stereotactic biopsy/ neg/ dr.byrnett  ? BREAST BIOPSY Left 2012  ? neg/dr. brynett  ? CESAREAN SECTION  2008   RPH  ? TONSILLECTOMY  2006  ? ? ?There were no vitals filed for this visit. ? ? Subjective Assessment - 07/18/21 1436   ? ? Subjective Pt states she is doing well today. Has not used band or ordered brace for walking yet. She felt tired following last session followed by minimal soreness.   ? Pertinent History Patient reports history of falls x3 in the past 6 months secondary to drop foot on the L LE. PMH:  Multiple sclerosis; elbow fracture of the R UE   ? Currently in Pain? No/denies   ? ?  ?  ? ?  ? ? ? ? ? ?INTERVENTION THIS DATE: ? ?-Recumbent bike 3 minutes at level 3 ?-Recumbent bike interval training: 30 sec on, 60 sec rest. Maintain consistent RPM 55-60 while adjusting level ranging from 3 to 7. Max HR 120. 4 rounds, 6 minutes.  ? ?Squats w/ UE support, 2x10 ?Split squat w/ UE support, 2x6-8 BLE  ?Sitting W for postural training, 2x10  ? ?Toe taps to first step, 2x30 alternating taps ?-occasional UE support, cued to decrease speed for improved control  ?Heel raises on first step, 2x10  ?BlueTB row, 2x10 ? -VC on cervical positioning  ? ?  ? ?  ?Clinical Impression: Pt is pleasant and motivated within session. Interval training for cardiovascular heath was increased by 2 minutes this session with other variables remaining consistent. She demonstrated improved neuromuscular control during the second set of all exercises due to improved muscular recruitment. Seated rest breaks as needed; fatigue described following sets of exercises. Pt will continue to benefit from continued PT to address limitations in strength, endurance and balance for improved QOL and participation in health/fitness and community events.  ? ? ? ? ? ? ? ?  PT Long Term Goals - 06/15/21 1853   ? ?  ? PT LONG TERM GOAL #1  ? Title Patient will be independent with balance HEP to maximize safety in ADL, IADL, and fitness activity   ? Baseline walking only   ? Time 4   ? Period Weeks   ? Status New   ? Target Date 07/13/21   ?  ? PT LONG TERM GOAL #2  ? Title Pt to demonstrate improvement in minibesTest >3 points to demonstrate improved posture control and reduced falls risk.   ? Baseline pending to visit 2   ? Time 5   ? Period Weeks   ? Status New   ? Target Date 07/20/21   ?  ? PT LONG TERM GOAL #3  ? Title Pt to perform SLS >15 sec bilat to indicate improved single limb motor control in gait.   ? Baseline 02/25/2019: 14sec (retest deferred to  visit 2)   ? Time 6   ? Period Weeks   ? Status New   ? Target Date 07/27/21   ?  ? PT LONG TERM GOAL #4  ? Title Pt will improve 6 MWT >1573f to demonstrate improved cardiorespiratory fitness   ? Baseline (02/15/21 11040f 06/15/21: 110083fgait belt, no AD)   ? Time 7   ? Period Weeks   ? Status New   ? Target Date 08/03/21   ?  ? PT LONG TERM GOAL #5  ? Title Pt to demonstrate 30sec chair rise >13 to indicate improved power in BLE.   ? Baseline defer to visit 2   ? Time 8   ? Period Weeks   ? Status New   ? Target Date 08/10/21   ? ?  ?  ? ?  ? ? ? ? ? Plan - 07/18/21 1519   ? ? Clinical Impression Statement Pt is pleasant and motivated within session. Interval training for cardiovascular heath was increased by 2 minutes this session with other variables remaining consistent. She demonstrated improved neuromuscular control during the second set of all exercises due to improved muscular recruitment. Seated rest breaks as needed; fatigue described following sets of exercises. Pt will continue to benefit from continued PT to address limitations in strength, endurance and balance for improved QOL and participation in health/fitness and community events.   ? Personal Factors and Comorbidities Past/Current Experience;Fitness;Behavior Pattern   ? Examination-Activity Limitations Locomotion Level;Transfers;Bed Mobility;Stand;Squat   ? Examination-Participation Restrictions Community Activity;[place]   ? Stability/Clinical Decision Making Evolving/Moderate complexity   ? Rehab Potential Good   ? Clinical Impairments Affecting Rehab Potential (-) hx of MS; (+) good motivation   ? PT Frequency 2x / week   ? PT Duration 12 weeks   ? PT Treatment/Interventions Iontophoresis '4mg'$ /ml Dexamethasone;Electrical Stimulation;Aquatic Therapy;Cryotherapy;Therapeutic activities;Neuromuscular re-education;Therapeutic exercise;Patient/family education;Dry needling;Manual techniques;Passive range of motion;Balance training;Gait  training;Stair training;Joint Manipulations;Functional mobility training   ? PT Next Visit Plan --   ? PT Home Exercise Plan walking for fitness (could use something for balance practice at home)   ? Consulted and Agree with Plan of Care Patient   ? ?  ?  ? ?  ? ? ?Patient will benefit from skilled therapeutic intervention in order to improve the following deficits and impairments:  Pain, Decreased coordination, Impaired perceived functional ability, Increased fascial restricitons, Increased muscle spasms, Difficulty walking, Abnormal gait, Decreased balance, Decreased mobility, Impaired sensation, Increased edema, Cardiopulmonary status limiting activity ? ?Visit Diagnosis: ?Difficulty in walking, not elsewhere  classified ? ?Muscle weakness (generalized) ? ?History of falling ? ? ? ? ?Problem List ?There are no problems to display for this patient. ? ? ?Patrina Levering PT, DPT ? ? ?[place] ?[facility] ?[address] ?[place], [place], [address] ?Phone: [phone]   Fax:  [phone] ? ?Name: LADAWNA WALGREN ?MRN: [id number] ?Date of Birth: [birth year] ? ? ? ?

## 2021-07-22 ENCOUNTER — Other Ambulatory Visit: Payer: Self-pay | Admitting: Internal Medicine

## 2021-07-22 DIAGNOSIS — Z1231 Encounter for screening mammogram for malignant neoplasm of breast: Secondary | ICD-10-CM

## 2021-07-24 LAB — COLOGUARD: COLOGUARD: NEGATIVE

## 2021-07-26 ENCOUNTER — Encounter: Payer: BC Managed Care – PPO | Admitting: Physical Therapy

## 2021-07-27 ENCOUNTER — Encounter: Payer: Self-pay | Admitting: Physical Therapy

## 2021-07-27 ENCOUNTER — Ambulatory Visit: Payer: BC Managed Care – PPO | Attending: Neurology | Admitting: Physical Therapy

## 2021-07-27 DIAGNOSIS — Z9181 History of falling: Secondary | ICD-10-CM | POA: Insufficient documentation

## 2021-07-27 DIAGNOSIS — M6281 Muscle weakness (generalized): Secondary | ICD-10-CM | POA: Diagnosis present

## 2021-07-27 DIAGNOSIS — R262 Difficulty in walking, not elsewhere classified: Secondary | ICD-10-CM | POA: Diagnosis present

## 2021-07-27 DIAGNOSIS — M25521 Pain in right elbow: Secondary | ICD-10-CM | POA: Insufficient documentation

## 2021-07-27 DIAGNOSIS — M25621 Stiffness of right elbow, not elsewhere classified: Secondary | ICD-10-CM | POA: Insufficient documentation

## 2021-07-27 NOTE — Therapy (Signed)
?[facility] ?[address] ?[place], [place], [address] ?Phone: [phone]   Fax:  [phone] ? ?Physical Therapy Treatment ? ?Patient Details  ?Name: Michelle Ruiz ?MRN: [id number] ?Date of Birth: [birth year] ?No data recorded ? ?Encounter Date: 07/27/2021 ? ? PT End of Session - 07/27/21 1625   ? ? Visit Number 6   ? Number of Visits 16   ? Date for PT Re-Evaluation 09/07/21   ? Authorization Type BCBS COMM Pro   ? Authorization Time Period 06/15/21-09/07/21   ? Progress Note Due on Visit 10   ? PT Start Time 1602   ? PT Stop Time 6962   ? PT Time Calculation (min) 43 min   ? Activity Tolerance Patient tolerated treatment well;No increased pain;Patient limited by fatigue   ? Behavior During Therapy [facility] for tasks assessed/performed   ? ?  ?  ? ?  ? ? ?Past Medical History:  ?Diagnosis Date  ? Abnormal Pap smear of cervix   ? Actinic keratosis   ? Anxiety   ? Basal cell carcinoma 04/14/2008  ? Right upper ant. thigh. BCC with sclerosis.  ? Basal cell carcinoma 08/14/2008  ? Right lat. lower thigh. Superficial.   ? Basal cell carcinoma 04/29/2019  ? Right mid forearm. Superficial and nodular patterns. EDC.  ? Basal cell carcinoma 08/17/2020  ? Right upper antecubital. EDC.  ? Kidney stones   ? MS (multiple sclerosis) ([place])   ? UTI (urinary tract infection)   ? ? ?Past Surgical History:  ?Procedure Laterality Date  ? BREAST BIOPSY Right 2010  ? stereotactic biopsy/ neg/ dr.byrnett  ? BREAST BIOPSY Left 2012  ? neg/dr. brynett  ? CESAREAN SECTION  2008   RPH  ? TONSILLECTOMY  2006  ? ? ?There were no vitals filed for this visit. ? ? Subjective Assessment - 07/27/21 1621   ? ? Subjective Pt states she is doing well today. She felt fine after her last session but did have a hard time getting off the couch. Arrives with new ankle brace for footdrop.   ? Pertinent History Patient reports history of falls x3 in the past 6 months secondary to drop foot on the L LE.  PMH: Multiple sclerosis; elbow fracture of the R UE   ? Currently in Pain? No/denies   ? ?  ?  ? ?  ? ? ? ? ? ? ?INTERVENTION: ?  ?-Recumbent bike 5 minutes at level 4 ?  ?-Squats w/ UE support, 2x10 ?-3 way hip using Yellow RB ? - hip abduction, 2x10 BLE ? - 45d (abd & ext), 2x10 BLE ? - hip extension, 2x10 BLE ?  ?Lateral med ball tosses to PT with trunk rotation, leading foot on BOSU trailing foot on ground; x10 each side  ? ?-wall push ups, 2x10  ?-seated hamstring stretch, x30 seconds each side  ? ?Pt brought new ankle brace for foot drop into session today for fitting and to learn how to don. Discovered brace to be too small. Pt plans to order a larger size.  ?  ?  ?  ?  ?Clinical Impression: Pt is pleasant and motivated within session.  ?She presented with SOB and elevated HR >120 on multiple occasions. Seated rest breaks taken as needed for recovery of sympathetic response and muscular fatigue. Pt arrived with new ankle brace for fitting and education on donning brace - brace was too small. Pt plans to order a larger  size. Pt will continue to benefit from continued PT to address limitations in strength, endurance and balance for improved QOL and participation in health/fitness and community events.  ? ? ? ? ? ? ? ? ? ? PT Long Term Goals - 06/15/21 1853   ? ?  ? PT LONG TERM GOAL #1  ? Title Patient will be independent with balance HEP to maximize safety in ADL, IADL, and fitness activity   ? Baseline walking only   ? Time 4   ? Period Weeks   ? Status New   ? Target Date 07/13/21   ?  ? PT LONG TERM GOAL #2  ? Title Pt to demonstrate improvement in minibesTest >3 points to demonstrate improved posture control and reduced falls risk.   ? Baseline pending to visit 2   ? Time 5   ? Period Weeks   ? Status New   ? Target Date 07/20/21   ?  ? PT LONG TERM GOAL #3  ? Title Pt to perform SLS >15 sec bilat to indicate improved single limb motor control in gait.   ? Baseline 02/25/2019: 14sec (retest deferred to  visit 2)   ? Time 6   ? Period Weeks   ? Status New   ? Target Date 07/27/21   ?  ? PT LONG TERM GOAL #4  ? Title Pt will improve 6 MWT >1577f to demonstrate improved cardiorespiratory fitness   ? Baseline (02/15/21 11083f 06/15/21: 110077fgait belt, no AD)   ? Time 7   ? Period Weeks   ? Status New   ? Target Date 08/03/21   ?  ? PT LONG TERM GOAL #5  ? Title Pt to demonstrate 30sec chair rise >13 to indicate improved power in BLE.   ? Baseline defer to visit 2   ? Time 8   ? Period Weeks   ? Status New   ? Target Date 08/10/21   ? ?  ?  ? ?  ? ? ? ? ? ? ? ? Plan - 07/27/21 1657   ? ? Clinical Impression Statement Pt is pleasant and motivated within session.   She presented with SOB and elevated HR >120 on multiple occasions. Seated rest breaks taken as needed for recovery of sympathetic response and muscular fatigue. Pt arrived with new ankle brace for fitting and education on donning brace - brace was too small. Pt plans to order a larger size. Pt will continue to benefit from continued PT to address limitations in strength, endurance and balance for improved QOL and participation in health/fitness and community events.   ? Personal Factors and Comorbidities Past/Current Experience;Fitness;Behavior Pattern   ? Examination-Activity Limitations Locomotion Level;Transfers;Bed Mobility;Stand;Squat   ? Examination-Participation Restrictions Community Activity;[place]   ? Stability/Clinical Decision Making Evolving/Moderate complexity   ? Rehab Potential Good   ? Clinical Impairments Affecting Rehab Potential (-) hx of MS; (+) good motivation   ? PT Frequency 2x / week   ? PT Duration 12 weeks   ? PT Treatment/Interventions Iontophoresis '4mg'$ /ml Dexamethasone;Electrical Stimulation;Aquatic Therapy;Cryotherapy;Therapeutic activities;Neuromuscular re-education;Therapeutic exercise;Patient/family education;Dry needling;Manual techniques;Passive range of motion;Balance training;Gait training;Stair training;Joint  Manipulations;Functional mobility training   ? PT Home Exercise Plan walking for fitness (could use something for balance practice at home)   ? Consulted and Agree with Plan of Care Patient   ? ?  ?  ? ?  ? ? ?Patient will benefit from skilled therapeutic intervention in order to improve the following deficits  and impairments:  Pain, Decreased coordination, Impaired perceived functional ability, Increased fascial restricitons, Increased muscle spasms, Difficulty walking, Abnormal gait, Decreased balance, Decreased mobility, Impaired sensation, Increased edema, Cardiopulmonary status limiting activity ? ?Visit Diagnosis: ?Difficulty in walking, not elsewhere classified ? ?Muscle weakness (generalized) ? ?History of falling ? ?Pain in right elbow ? ?Stiffness of right elbow, not elsewhere classified ? ? ? ? ?Problem List ?There are no problems to display for this patient. ? ? ?Patrina Levering PT, DPT ? ? ?[place] ?[facility] ?[address] ?[place], [place], [address] ?Phone: [phone]   Fax:  [phone] ? ?Name: Michelle Ruiz ?MRN: [id number] ?Date of Birth: [birth year] ? ? ? ?

## 2021-08-01 ENCOUNTER — Ambulatory Visit: Payer: BC Managed Care – PPO | Admitting: Physical Therapy

## 2021-08-01 ENCOUNTER — Encounter: Payer: Self-pay | Admitting: Physical Therapy

## 2021-08-01 DIAGNOSIS — M6281 Muscle weakness (generalized): Secondary | ICD-10-CM

## 2021-08-01 DIAGNOSIS — M25521 Pain in right elbow: Secondary | ICD-10-CM

## 2021-08-01 DIAGNOSIS — Z9181 History of falling: Secondary | ICD-10-CM

## 2021-08-01 DIAGNOSIS — M25621 Stiffness of right elbow, not elsewhere classified: Secondary | ICD-10-CM

## 2021-08-01 DIAGNOSIS — R262 Difficulty in walking, not elsewhere classified: Secondary | ICD-10-CM

## 2021-08-01 NOTE — Therapy (Signed)
[place] ?[facility] ?[address] ?[place], [place], [address] ?Phone: [phone]   Fax:  [phone] ? ?Physical Therapy Treatment ? ?Patient Details  ?Name: Michelle Ruiz ?MRN: [id number] ?Date of Birth: 05/04/1970 ?No data recorded ? ?Encounter Date: 08/01/2021 ? ? PT End of Session - 08/01/21 1712   ? ? Visit Number 7   ? Number of Visits 16   ? Date for PT Re-Evaluation 09/07/21   ? Authorization Type BCBS COMM Pro   ? Authorization Time Period 06/15/21-09/07/21   ? Progress Note Due on Visit 10   ? PT Start Time 1600   ? PT Stop Time 3810   ? PT Time Calculation (min) 45 min   ? Activity Tolerance Patient tolerated treatment well;No increased pain;Patient limited by fatigue   ? Behavior During Therapy [facility] for tasks assessed/performed   ? ?  ?  ? ?  ? ? ?Past Medical History:  ?Diagnosis Date  ? Abnormal Pap smear of cervix   ? Actinic keratosis   ? Anxiety   ? Basal cell carcinoma 04/14/2008  ? Right upper ant. thigh. BCC with sclerosis.  ? Basal cell carcinoma 08/14/2008  ? Right lat. lower thigh. Superficial.   ? Basal cell carcinoma 04/29/2019  ? Right mid forearm. Superficial and nodular patterns. EDC.  ? Basal cell carcinoma 08/17/2020  ? Right upper antecubital. EDC.  ? Kidney stones   ? MS (multiple sclerosis) ([place])   ? UTI (urinary tract infection)   ? ? ?Past Surgical History:  ?Procedure Laterality Date  ? BREAST BIOPSY Right 2010  ? stereotactic biopsy/ neg/ dr.byrnett  ? BREAST BIOPSY Left 2012  ? neg/dr. brynett  ? CESAREAN SECTION  2008   RPH  ? TONSILLECTOMY  2006  ? ? ?There were no vitals filed for this visit. ? ? Subjective Assessment - 08/01/21 1603   ? ? Subjective Pt states she is doing well today. No soreness following last session. She ordered another brace (larger size) and arrives with it today.   ? Pertinent History Patient reports history of falls x3 in the past 6 months secondary to drop foot on the L LE. PMH: Multiple sclerosis;  elbow fracture of the R UE   ? Currently in Pain? No/denies   ? ?  ?  ? ?  ? ? ? ? ? ?INTERVENTION: ?  ?-Recumbent bike 2.5 minutes at level 4 ?-Recumbent bike interval training - 30 seconds level 6, 60 seconds level 4 maintaining RPM ~60, 3 sets.  ?  ?-STS tap to 22 inch mat with 6# DB in each hand, 3x8 ? ?-Standing row BlueTB 3x10 ?  ?-Lateral med ball (3kg) tosses to PT with trunk rotation, leading foot on BOSU trailing foot on ground; 2x10 each side  ?  ? ?Pt brought new ankle brace for foot drop into session today for fitting and to learn how to don. Pt ambulated with brace; antalgic gait noted. Pt reports gait pattern is probably due to her "testing out" the brace. PT advised pt to wear for a couple days to adapt. PT also advised pt to check for skin integrity of dorsal foot after wearing due to plastic pieces that extend past the width of the tongue.  ?  ?  ?  ?  ?Clinical Impression: Pt is pleasant and motivated within session. Extended time required to fit brace due to trial and error. Brace seems to be effective; pt will require time  to adapt to brace for more normalized gait pattern. Cardiopulmonary system most challenged by large muscle/multi-muscle group exercises. Pt requiring seated rest breaks between sets. LE fatigue noted with shaking. Pt will continue to benefit from continued PT to address limitations in strength, endurance and balance for improved QOL and participation in health/fitness and community events.  ? ? ? ? ? ? ? ? PT Long Term Goals - 06/15/21 1853   ? ?  ? PT LONG TERM GOAL #1  ? Title Patient will be independent with balance HEP to maximize safety in ADL, IADL, and fitness activity   ? Baseline walking only   ? Time 4   ? Period Weeks   ? Status New   ? Target Date 07/13/21   ?  ? PT LONG TERM GOAL #2  ? Title Pt to demonstrate improvement in minibesTest >3 points to demonstrate improved posture control and reduced falls risk.   ? Baseline pending to visit 2   ? Time 5   ? Period  Weeks   ? Status New   ? Target Date 07/20/21   ?  ? PT LONG TERM GOAL #3  ? Title Pt to perform SLS >15 sec bilat to indicate improved single limb motor control in gait.   ? Baseline 02/25/2019: 14sec (retest deferred to visit 2)   ? Time 6   ? Period Weeks   ? Status New   ? Target Date 07/27/21   ?  ? PT LONG TERM GOAL #4  ? Title Pt will improve 6 MWT >1542f to demonstrate improved cardiorespiratory fitness   ? Baseline (02/15/21 11072f 06/15/21: 110038fgait belt, no AD)   ? Time 7   ? Period Weeks   ? Status New   ? Target Date 08/03/21   ?  ? PT LONG TERM GOAL #5  ? Title Pt to demonstrate 30sec chair rise >13 to indicate improved power in BLE.   ? Baseline defer to visit 2   ? Time 8   ? Period Weeks   ? Status New   ? Target Date 08/10/21   ? ?  ?  ? ?  ? ? ? ? ? ? ? ? Plan - 08/01/21 1713   ? ? Clinical Impression Statement Pt is pleasant and motivated within session. Extended time required to fit brace due to trial and error. Brace seems to be effective; pt will require time to adapt to brace for more normalized gait pattern. Cardiopulmonary system most challenged by large muscle/multi-muscle group exercises. Pt requiring seated rest breaks between sets. LE fatigue noted with shaking. Pt will continue to benefit from continued PT to address limitations in strength, endurance and balance for improved QOL and participation in health/fitness and community events.   ? Personal Factors and Comorbidities Past/Current Experience;Fitness;Behavior Pattern   ? Examination-Activity Limitations Locomotion Level;Transfers;Bed Mobility;Stand;Squat   ? Examination-Participation Restrictions Community Activity;[place]   ? Stability/Clinical Decision Making Evolving/Moderate complexity   ? Rehab Potential Good   ? Clinical Impairments Affecting Rehab Potential (-) hx of MS; (+) good motivation   ? PT Frequency 2x / week   ? PT Duration 12 weeks   ? PT Treatment/Interventions Iontophoresis '4mg'$ /ml Dexamethasone;Electrical  Stimulation;Aquatic Therapy;Cryotherapy;Therapeutic activities;Neuromuscular re-education;Therapeutic exercise;Patient/family education;Dry needling;Manual techniques;Passive range of motion;Balance training;Gait training;Stair training;Joint Manipulations;Functional mobility training   ? PT Next Visit Plan strengthening, balance, endurance   ? PT Home Exercise Plan walking for fitness   ? Consulted and Agree with Plan of Care  Patient   ? ?  ?  ? ?  ? ? ?Patient will benefit from skilled therapeutic intervention in order to improve the following deficits and impairments:  Pain, Decreased coordination, Impaired perceived functional ability, Increased fascial restricitons, Increased muscle spasms, Difficulty walking, Abnormal gait, Decreased balance, Decreased mobility, Impaired sensation, Increased edema, Cardiopulmonary status limiting activity ? ?Visit Diagnosis: ?Difficulty in walking, not elsewhere classified ? ?Muscle weakness (generalized) ? ?History of falling ? ?Pain in right elbow ? ?Stiffness of right elbow, not elsewhere classified ? ? ? ? ?Problem List ?There are no problems to display for this patient. ? ? ? ?Patrina Levering PT, DPT ? ? ?[place] ?[facility] ?[address] ?[place], [place], [address] ?Phone: [phone]   Fax:  [phone] ? ?Name: Michelle Ruiz ?MRN: [id number] ?Date of Birth: 1970/08/03 ? ? ? ?

## 2021-08-08 ENCOUNTER — Encounter: Payer: BC Managed Care – PPO | Admitting: Physical Therapy

## 2021-08-17 ENCOUNTER — Encounter: Payer: Self-pay | Admitting: Physical Therapy

## 2021-08-17 ENCOUNTER — Ambulatory Visit: Payer: BC Managed Care – PPO

## 2021-08-17 DIAGNOSIS — R262 Difficulty in walking, not elsewhere classified: Secondary | ICD-10-CM | POA: Diagnosis not present

## 2021-08-17 DIAGNOSIS — M6281 Muscle weakness (generalized): Secondary | ICD-10-CM

## 2021-08-17 DIAGNOSIS — Z9181 History of falling: Secondary | ICD-10-CM

## 2021-08-17 NOTE — Therapy (Signed)
[facility] [address] [place], [address] Phone: [phone]   Fax:  [phone]  Physical Therapy Treatment  Patient Details  Name: Michelle Ruiz MRN: [id number] Date of Birth: [birth year] No data recorded  Encounter Date: 08/17/2021   PT End of Session - 08/17/21 1608     Visit Number 8    Number of Visits 16    Date for PT Re-Evaluation 09/07/21    Authorization Type [employer] COMM Pro    Authorization Time Period 06/15/21-09/07/21    Progress Note Due on Visit 10    PT Start Time 1601    PT Stop Time 1648    PT Time Calculation (min) 47 min    Activity Tolerance Patient tolerated treatment well;Patient limited by fatigue    Behavior During Therapy [facility] for tasks assessed/performed             Past Medical History:  Diagnosis Date   Abnormal Pap smear of cervix    Actinic keratosis    Anxiety    Basal cell carcinoma 04/14/2008   Right upper ant. thigh. BCC with sclerosis.   Basal cell carcinoma 08/14/2008   Right lat. lower thigh. Superficial.    Basal cell carcinoma 04/29/2019   Right mid forearm. Superficial and nodular patterns. EDC.   Basal cell carcinoma 08/17/2020   Right upper antecubital. EDC.   Kidney stones    MS (multiple sclerosis) ([place])    UTI (urinary tract infection)     Past Surgical History:  Procedure Laterality Date   BREAST BIOPSY Right 2010   stereotactic biopsy/ neg/ dr.byrnett   BREAST BIOPSY Left 2012   neg/dr. brynett   CESAREAN SECTION  2008   [facility]   TONSILLECTOMY  2006    There were no vitals filed for this visit.   Subjective Assessment - 08/17/21 1602     Subjective Pt reports her MS has been causing her to have increased spasticity and clonus. Does not have exact trigger.    Pertinent History Patient reports history of falls x3 in the past 6 months secondary to drop foot on the L LE. PMH: Multiple sclerosis; elbow fracture of the R UE    Currently in  Pain? No/denies             There.ex:   Recumbent bike 2.5 minutes at level 4  Recumbent bike interval training - 30 seconds level 6, 60 seconds level 4 maintaining RPM ~60, 2 sets. 1 minute cool down on Level 1. 7 minutes, 30 seconds total.   STS tap to 22 inch mat with 6# DB in each hand, 3x6.   Forwards L foot step ups on 6" step with focus on power stepping up and eccentric control with descent. Progressed to forwards L foot step ups on 6" step. 3x8 on L, 2x6 on R. Focus on balance, coordination and eccentric strengthening. Noted imbalance with LLE descent  Lateral R and L 6" step ups with focus on eccentric control. 2x6/direction   Education on activity modification, monitoring of exacerbation of MS symptoms.    Gait training:   Reassess new brace fit for L foot due to her foot drop. Brace fits adequately. Noted L antalgic gait with decreased stance time and terminal stance on L foot throughout. VC's throughout for consistent R heel strike and push off in terminal stance and increased L foot step lengths, 2x200' laps. Seated rest between bouts. Improvement in normalized gait but still  demonstrating mildly decreased stance time on LLE.     PT Education - 08/17/21 1608     Education Details form/technique with exercise.    Person(s) Educated Patient    Methods Explanation;Demonstration;Tactile cues;Verbal cues    Comprehension Verbalized understanding;Returned demonstration                 PT Long Term Goals - 06/15/21 1853       PT LONG TERM GOAL #1   Title Patient will be independent with balance HEP to maximize safety in ADL, IADL, and fitness activity    Baseline walking only    Time 4    Period Weeks    Status New    Target Date 07/13/21      PT LONG TERM GOAL #2   Title Pt to demonstrate improvement in minibesTest >3 points to demonstrate improved posture control and reduced falls risk.    Baseline pending to visit 2    Time 5    Period Weeks     Status New    Target Date 07/20/21      PT LONG TERM GOAL #3   Title Pt to perform SLS >15 sec bilat to indicate improved single limb motor control in gait.    Baseline 02/25/2019: 14sec (retest deferred to visit 2)    Time 6    Period Weeks    Status New    Target Date 07/27/21      PT LONG TERM GOAL #4   Title Pt will improve 6 MWT >1530f to demonstrate improved cardiorespiratory fitness    Baseline (02/15/21 11059f 06/15/21: 110090fgait belt, no AD)    Time 7    Period Weeks    Status New    Target Date 08/03/21      PT LONG TERM GOAL #5   Title Pt to demonstrate 30sec chair rise >13 to indicate improved power in BLE.    Baseline defer to visit 2    Time 8    Period Weeks    Status New    Target Date 08/10/21                   Plan - 08/17/21 1652     Clinical Impression Statement Focus of session on strengthening, coordination, and balance but modified due to exacerbation of MS symptoms. Pt experiencing L foot clonus, increased incoordination, decreased motor control with exercise and gait over the past two weeks. education provided on rest, monitoring of symptoms and signs/symptoms to contact neurologist. Pt does have adequate fitting L foot brace for L foot drop with education and performance of gait training with good understanding. Pt will continue to benefit from skilled PT services to further progress strength/balance to improve QoL.    Personal Factors and Comorbidities Past/Current Experience;Fitness;Behavior Pattern    Examination-Activity Limitations Locomotion Level;Transfers;Bed Mobility;Stand;Squat    Examination-Participation Restrictions Community Activity;Yard Work    StaMerchant navy officerolving/Moderate complexity    Rehab Potential Good    Clinical Impairments Affecting Rehab Potential (-) hx of MS; (+) good motivation    PT Frequency 2x / week    PT Duration 12 weeks    PT Treatment/Interventions Iontophoresis '4mg'$ /ml  Dexamethasone;Electrical Stimulation;Aquatic Therapy;Cryotherapy;Therapeutic activities;Neuromuscular re-education;Therapeutic exercise;Patient/family education;Dry needling;Manual techniques;Passive range of motion;Balance training;Gait training;Stair training;Joint Manipulations;Functional mobility training    PT Next Visit Plan strengthening, balance, endurance    PT Home Exercise Plan walking for fitness    Consulted and Agree with Plan of Care Patient  Patient will benefit from skilled therapeutic intervention in order to improve the following deficits and impairments:  Pain, Decreased coordination, Impaired perceived functional ability, Increased fascial restricitons, Increased muscle spasms, Difficulty walking, Abnormal gait, Decreased balance, Decreased mobility, Impaired sensation, Increased edema, Cardiopulmonary status limiting activity  Visit Diagnosis: Difficulty in walking, not elsewhere classified  History of falling  Muscle weakness (generalized)     Problem List There are no problems to display for this patient.  Salem Caster. Fairly IV, PT, DPT Physical Therapist- [facility]  08/17/2021, 4:56 PM  [facility] [address] [place], [address] Phone: [phone]   Fax:  [phone]  Name: Michelle Ruiz MRN: [id number] Date of Birth: [birth year]

## 2021-08-23 ENCOUNTER — Encounter: Payer: Self-pay | Admitting: Dermatology

## 2021-08-23 ENCOUNTER — Ambulatory Visit: Payer: BC Managed Care – PPO | Admitting: Dermatology

## 2021-08-23 DIAGNOSIS — Z1283 Encounter for screening for malignant neoplasm of skin: Secondary | ICD-10-CM | POA: Diagnosis not present

## 2021-08-23 DIAGNOSIS — C44719 Basal cell carcinoma of skin of left lower limb, including hip: Secondary | ICD-10-CM | POA: Diagnosis not present

## 2021-08-23 DIAGNOSIS — D1800 Hemangioma unspecified site: Secondary | ICD-10-CM

## 2021-08-23 DIAGNOSIS — L719 Rosacea, unspecified: Secondary | ICD-10-CM

## 2021-08-23 DIAGNOSIS — D492 Neoplasm of unspecified behavior of bone, soft tissue, and skin: Secondary | ICD-10-CM

## 2021-08-23 DIAGNOSIS — D225 Melanocytic nevi of trunk: Secondary | ICD-10-CM | POA: Diagnosis not present

## 2021-08-23 DIAGNOSIS — L700 Acne vulgaris: Secondary | ICD-10-CM

## 2021-08-23 DIAGNOSIS — D2271 Melanocytic nevi of right lower limb, including hip: Secondary | ICD-10-CM

## 2021-08-23 DIAGNOSIS — L814 Other melanin hyperpigmentation: Secondary | ICD-10-CM

## 2021-08-23 DIAGNOSIS — D2262 Melanocytic nevi of left upper limb, including shoulder: Secondary | ICD-10-CM

## 2021-08-23 DIAGNOSIS — D229 Melanocytic nevi, unspecified: Secondary | ICD-10-CM

## 2021-08-23 DIAGNOSIS — D2272 Melanocytic nevi of left lower limb, including hip: Secondary | ICD-10-CM

## 2021-08-23 DIAGNOSIS — L578 Other skin changes due to chronic exposure to nonionizing radiation: Secondary | ICD-10-CM

## 2021-08-23 DIAGNOSIS — Z85828 Personal history of other malignant neoplasm of skin: Secondary | ICD-10-CM

## 2021-08-23 DIAGNOSIS — L821 Other seborrheic keratosis: Secondary | ICD-10-CM

## 2021-08-23 DIAGNOSIS — L570 Actinic keratosis: Secondary | ICD-10-CM

## 2021-08-23 MED ORDER — CLINDAMYCIN PHOS-BENZOYL PEROX 1.2-5 % EX GEL: CUTANEOUS | 3 refills | Status: AC

## 2021-08-23 NOTE — Progress Notes (Signed)
Follow-Up Visit   Subjective  Michelle Ruiz is a 51 y.o. female who presents for the following: Annual Exam (Skin cancer screening. Full body. Hx of multiple BCC's. New lesion on post left leg. Pink, raised. Recheck AK treated at right nasal root) and Rosacea (6 month recheck. Face. Using Azelaic acid twice daily).  Doing better.  Hasn't had PDT to chest yet, since had to reschedule due to Covid infection.  The patient presents for Total-Body Skin Exam (TBSE) for skin cancer screening and mole check.  The patient has spots, moles and lesions to be evaluated, some may be new or changing and the patient has concerns that these could be cancer.   The following portions of the chart were reviewed this encounter and updated as appropriate:      Review of Systems: No other skin or systemic complaints except as noted in HPI or Assessment and Plan.   Objective  Well appearing patient in no apparent distress; mood and affect are within normal limits.  A full examination was performed including scalp, head, eyes, ears, nose, lips, neck, chest, axillae, abdomen, back, buttocks, bilateral upper extremities, bilateral lower extremities, hands, feet, fingers, toes, fingernails, and toenails. All findings within normal limits unless otherwise noted below.  face Mid face erythema with telangiectasias +/- scattered inflammatory papules. Inflammatory papules at cheeks  Left Lower Leg - Posterior 0.5 cm pink smooth papule     Head - Anterior (Face) Mild erythema with telangiectasia mid face  left mid back, left shoulder, right medial upper calf, right medial knee, left lateral heel 5 x 3 mm medium brown macule at left mid back 4 mm brown macule with darker center at left shoulder  19m waxy medium dark brown papule at right medial upper calf (SK)  2.581mmed dark brown macule at right medial knee  4 mm speckled brown macule at left lateral heel   Assessment & Plan  Acne  vulgaris face  Chronic and persistent condition with duration or expected duration over one year. Condition is symptomatic / bothersome to patient. Not to goal.  Start Duac gel: spot treat once daily to lower cheeks  If not improving consider adding Spironolactone on f/up.   Clindamycin-Benzoyl Per, Refr, (DUAC) gel - face Spot treat areas on face once dialy  Neoplasm of skin Left Lower Leg - Posterior  Epidermal / dermal shaving  Lesion diameter (cm):  0.5 Informed consent: discussed and consent obtained   Patient was prepped and draped in usual sterile fashion: Area prepped with alcohol. Anesthesia: the lesion was anesthetized in a standard fashion   Anesthetic:  1% lidocaine w/ epinephrine 1-100,000 buffered w/ 8.4% NaHCO3 Instrument used: flexible razor blade   Hemostasis achieved with: pressure, aluminum chloride and electrodesiccation   Outcome: patient tolerated procedure well    Destruction of lesion  Destruction method: electrodesiccation and curettage   Timeout:  patient name, date of birth, surgical site, and procedure verified Curettage performed in three different directions: Yes   Electrodesiccation performed over the curetted area: Yes   Final wound size (cm):  0.8 Hemostasis achieved with:  pressure, aluminum chloride and electrodesiccation Outcome: patient tolerated procedure well with no complications   Post-procedure details: wound care instructions given   Additional details:  Mupirocin ointment and Bandaid applied    Specimen 1 - Surgical pathology Differential Diagnosis: ISK, R/O BCC  Check Margins: No  Rosacea Head - Anterior (Face)  Rosacea is a chronic progressive skin condition usually affecting the face  of adults, causing redness and/or acne bumps. It is treatable but not curable. It sometimes affects the eyes (ocular rosacea) as well. It may respond to topical and/or systemic medication and can flare with stress, sun exposure, alcohol,  exercise and some foods.  Daily application of broad spectrum spf 30+ sunscreen to face is recommended to reduce flares.  Continue Azelaic acid 15% gel qd/bid as directed.   Related Medications Azelaic Acid 15 % gel Apply 1 application topically 2 (two) times daily. After skin is thoroughly washed and patted dry, gently but thoroughly massage a thin film of azelaic acid cream into the affected area twice daily, in the morning and evening.  Nevus left mid back, left shoulder, right medial upper calf, right medial knee, left lateral heel  Benign-appearing.  Observation.  Call clinic for new or changing lesions.  Recommend daily use of broad spectrum spf 30+ sunscreen to sun-exposed areas.   Photos compared, stable   History of Basal Cell Carcinoma of the Skin. Right thigh, right mid forearm, right upper antecubital, L malar cheek - No evidence of recurrence today - Recommend regular full body skin exams - Recommend daily broad spectrum sunscreen SPF 30+ to sun-exposed areas, reapply every 2 hours as needed.  - Call if any new or changing lesions are noted between office visits  Lentigines - Scattered tan macules - Due to sun exposure - Benign-appearing, observe - Recommend daily broad spectrum sunscreen SPF 30+ to sun-exposed areas, reapply every 2 hours as needed. - Call for any changes  Seborrheic Keratoses - Stuck-on, waxy, tan-brown papules and/or plaques  - Benign-appearing - Discussed benign etiology and prognosis. - Observe - Call for any changes  Melanocytic Nevi - Tan-brown and/or pink-flesh-colored symmetric macules and papules - Benign appearing on exam today - Observation - Call clinic for new or changing moles - Recommend daily use of broad spectrum spf 30+ sunscreen to sun-exposed areas.   Hemangiomas - Red papules - Discussed benign nature - Observe - Call for any changes  Actinic Damage - Severe, confluent actinic changes with pre-cancerous actinic  keratoses  - Severe, chronic, not at goal, secondary to cumulative UV radiation exposure over time - diffuse scaly erythematous macules and papules with underlying dyspigmentation - Discussed Prescription "Field Treatment" for Severe, Chronic Confluent Actinic Changes with Pre-Cancerous Actinic Keratoses Field treatment involves treatment of an entire area of skin that has confluent Actinic Changes (Sun/ Ultraviolet light damage) and PreCancerous Actinic Keratoses by method of PhotoDynamic Therapy (PDT) and/or prescription Topical Chemotherapy agents such as 5-fluorouracil, 5-fluorouracil/calcipotriene, and/or imiquimod.  The purpose is to decrease the number of clinically evident and subclinical PreCancerous lesions to prevent progression to development of skin cancer by chemically destroying early precancer changes that may or may not be visible.  It has been shown to reduce the risk of developing skin cancer in the treated area. As a result of treatment, redness, scaling, crusting, and open sores may occur during treatment course. One or more than one of these methods may be used and may have to be used several times to control, suppress and eliminate the PreCancerous changes. Discussed treatment course, expected reaction, and possible side effects. - Recommend daily broad spectrum sunscreen SPF 30+ to sun-exposed areas, reapply every 2 hours as needed.  - Staying in the shade or wearing long sleeves, sun glasses (UVA+UVB protection) and wide brim hats (4-inch brim around the entire circumference of the hat) are also recommended. - Call for new or changing lesions.  Will  schedule for PDT on chest x 2, 1 month apart.   Skin cancer screening performed today.   Return in about 6 months (around 02/23/2022) for TBSE, PDT next available.  I, Emelia Salisbury, CMA, am acting as scribe for Brendolyn Patty, MD.  Documentation: I have reviewed the above documentation for accuracy and completeness, and I agree  with the above.  Brendolyn Patty MD

## 2021-08-23 NOTE — Patient Instructions (Addendum)
Electrodesiccation and Curettage ("Scrape and Burn") Wound Care Instructions  Leave the original bandage on for 24 hours if possible.  If the bandage becomes soaked or soiled before that time, it is OK to remove it and examine the wound.  A small amount of post-operative bleeding is normal.  If excessive bleeding occurs, remove the bandage, place gauze over the site and apply continuous pressure (no peeking) over the area for 30 minutes. If this does not work, please call our clinic as soon as possible or page your doctor if it is after hours.   Once a day, cleanse the wound with soap and water. It is fine to shower. If a thick crust develops you may use a Q-tip dipped into dilute hydrogen peroxide (mix 1:1 with water) to dissolve it.  Hydrogen peroxide can slow the healing process, so use it only as needed.    After washing, apply petroleum jelly (Vaseline) or an antibiotic ointment if your doctor prescribed one for you, followed by a bandage.    For best healing, the wound should be covered with a layer of ointment at all times. If you are not able to keep the area covered with a bandage to hold the ointment in place, this may mean re-applying the ointment several times a day.  Continue this wound care until the wound has healed and is no longer open. It may take several weeks for the wound to heal and close.  Itching and mild discomfort is normal during the healing process.  If you have any discomfort, you can take Tylenol (acetaminophen) or ibuprofen as directed on the bottle. (Please do not take these if you have an allergy to them or cannot take them for another reason).  Some redness, tenderness and white or yellow material in the wound is normal healing.  If the area becomes very sore and red, or develops a thick yellow-green material (pus), it may be infected; please notify us.    Wound healing continues for up to one year following surgery. It is not unusual to experience pain in the scar  from time to time during the interval.  If the pain becomes severe or the scar thickens, you should notify the office.    A slight amount of redness in a scar is expected for the first six months.  After six months, the redness will fade and the scar will soften and fade.  The color difference becomes less noticeable with time.  If there are any problems, return for a post-op surgery check at your earliest convenience.  To improve the appearance of the scar, you can use silicone scar gel, cream, or sheets (such as Mederma or Serica) every night for up to one year. These are available over the counter (without a prescription).  Please call our office at [phone] for any questions or concerns.    Recommend daily broad spectrum sunscreen SPF 30+ to sun-exposed areas, reapply every 2 hours as needed. Call for new or changing lesions.  Staying in the shade or wearing long sleeves, sun glasses (UVA+UVB protection) and wide brim hats (4-inch brim around the entire circumference of the hat) are also recommended for sun protection.    Melanoma ABCDEs  Melanoma is the most dangerous type of skin cancer, and is the leading cause of death from skin disease.  You are more likely to develop melanoma if you: Have light-colored skin, light-colored eyes, or red or blond hair Spend a lot of time in the sun Tan  regularly, either outdoors or in a tanning bed Have had blistering sunburns, especially during childhood Have a close family member who has had a melanoma Have atypical moles or large birthmarks  Early detection of melanoma is key since treatment is typically straightforward and cure rates are extremely high if we catch it early.   The first sign of melanoma is often a change in a mole or a new dark spot.  The ABCDE system is a way of remembering the signs of melanoma.  A for asymmetry:  The two halves do not match. B for border:  The edges of the growth are irregular. C for color:  A mixture  of colors are present instead of an even brown color. D for diameter:  Melanomas are usually (but not always) greater than 31m - the size of a pencil eraser. E for evolution:  The spot keeps changing in size, shape, and color.  Please check your skin once per month between visits. You can use a small mirror in front and a large mirror behind you to keep an eye on the back side or your body.   If you see any new or changing lesions before your next follow-up, please call to schedule a visit.  Please continue daily skin protection including broad spectrum sunscreen SPF 30+ to sun-exposed areas, reapplying every 2 hours as needed when you're outdoors.   Staying in the shade or wearing long sleeves, sun glasses (UVA+UVB protection) and wide brim hats (4-inch brim around the entire circumference of the hat) are also recommended for sun protection.     If You Need Anything After Your Visit  If you have any questions or concerns for your doctor, please call our main line at [phone] press option 4 to reach your doctor's medical assistant. If no one answers, please leave a voicemail as directed and we will return your call as soon as possible. Messages left after 4 pm will be answered the following business day.   You may also send u[place] message via MS[place] We typically respond to MyChart messages within 1-2 business days.  For prescription refills, please ask your pharmacy to contact our office. Our fax number is [phone]  If you have an urgent issue when the clinic is closed that cannot wait until the next business day, you can page your doctor at the number below.    Please note that while we do our best to be available for urgent issues outside of office hours, we are not available 24/7.   If you have an urgent issue and are unable to reach u[place] you may choose to seek medical care at your doctor's office, retail clinic, urgent care center, or emergency room.  If you have a medical  emergency, please immediately call 911 or go to the emergency department.  Pager Numbers  - Dr. KNehemiah Massed [phone] - Dr. MLaurence Ferrari [phone] - Dr. SNicole Kindred [phone] In the event of inclement weather, please call our main line at [phone] an update on the status of any delays or closures.  Dermatology Medication Tips: Please keep the boxes that topical medications come in in order to help keep track of the instructions about where and how to use these. Pharmacies typically print the medication instructions only on the boxes and not directly on the medication tubes.   If your medication is too expensive, please contact our office at [phone] 4 or send u[place] message through MH[place]   We  are unable to tell what your co-pay for medications will be in advance as this is different depending on your insurance coverage. However, we may be able to find a substitute medication at lower cost or fill out paperwork to get insurance to cover a needed medication.   If a prior authorization is required to get your medication covered by your insurance company, please allow [place] 1-2 business days to complete this process.  Drug prices often vary depending on where the prescription is filled and some pharmacies may offer cheaper prices.  The website www.goodrx.com contains coupons for medications through different pharmacies. The prices here do not account for what the cost may be with help from insurance (it may be cheaper with your insurance), but the website can give you the price if you did not use any insurance.  - You can print the associated coupon and take it with your prescription to the pharmacy.  - You may also stop by our office during regular business hours and pick up a GoodRx coupon card.  - If you need your prescription sent electronically to a different pharmacy, notify our office through [facility] or by phone at [phone] option 4.     Si Usted  Necesita Algo Despus de Su Visita  Tambin puede enviarnos un mensaje a travs de Pharmacist, community. Por lo general respondemos a los mensajes de MyChart en el transcurso de 1 a 2 das hbiles.  Para renovar recetas, por favor pida a su farmacia que se ponga en contacto con nuestra oficina. Harland Dingwall de fax es Roxie [phone].  Si tiene un asunto urgente cuando la clnica est cerrada y que no puede esperar hasta el siguiente da hbil, puede llamar/localizar a su doctor(a) al nmero que aparece a continuacin.   Por favor, tenga en cuenta que aunque hacemos todo lo posible para estar disponibles para asuntos urgentes fuera del horario de [place], no estamos disponibles las 24 horas del da, los 7 das de la [place].   Si tiene un problema urgente y no puede comunicarse con nosotros, puede optar por buscar atencin mdica  en el consultorio de su doctor(a), en una clnica privada, en un centro de atencin urgente o en una sala de emergencias.  Si tiene Engineering geologist, por favor llame inmediatamente al 911 o vaya a la sala de emergencias.  Nmeros de bper  - Dr. Nehemiah Massed: [phone]  - Dra. Moye: [phone]  - Dra. Nicole Kindred: [phone]  En caso de inclemencias del [place], por favor llame a Johnsie Kindred principal al [phone] para una actualizacin sobre el [place] de cualquier retraso o cierre.  Consejos para la medicacin en dermatologa: Por favor, guarde las cajas en las que vienen los medicamentos de uso tpico para ayudarle a seguir las instrucciones sobre dnde y cmo usarlos. Las farmacias generalmente imprimen las instrucciones del medicamento slo en las cajas y no directamente en los tubos del [place].   Si su medicamento es muy caro, por favor, pngase en contacto con Zigmund Daniel llamando al [phone] y presione la opcin 4 o envenos un mensaje a travs de Pharmacist, community.   No podemos decirle cul ser su copago por los medicamentos por adelantado ya que esto es  diferente dependiendo de la cobertura de su seguro. Sin embargo, es posible que podamos encontrar un medicamento sustituto a Electrical engineer un formulario para que el seguro cubra el medicamento que se considera necesario.   Si se requiere una autorizacin previa para que su compaa  de seguros [place] su medicamento, por favor permtanos de 1 a 2 das hbiles para completar este proceso.  Los precios de los medicamentos varan con frecuencia dependiendo del Environmental consultant de dnde se surte la receta y alguna farmacias pueden ofrecer precios ms baratos.  El sitio web www.goodrx.com tiene cupones para medicamentos de Airline pilot. Los precios aqu no tienen en cuenta lo que podra costar con la ayuda del seguro (puede ser ms barato con su seguro), pero el sitio web puede darle el precio si no utiliz Research scientist (physical sciences).  - Puede imprimir el cupn correspondiente y llevarlo con su receta a la farmacia.  - Tambin puede pasar por nuestra oficina durante el horario de atencin regular y Charity fundraiser una tarjeta de cupones de GoodRx.  - Si necesita que su receta se enve electrnicamente a una farmacia diferente, informe a nuestra oficina a travs de MyChart de [place] o por telfono llamando al [phone] y presione la opcin 4.

## 2021-08-24 ENCOUNTER — Encounter: Payer: Self-pay | Admitting: Physical Therapy

## 2021-08-24 ENCOUNTER — Ambulatory Visit: Payer: BC Managed Care – PPO | Admitting: Physical Therapy

## 2021-08-24 DIAGNOSIS — R262 Difficulty in walking, not elsewhere classified: Secondary | ICD-10-CM | POA: Diagnosis not present

## 2021-08-24 DIAGNOSIS — Z9181 History of falling: Secondary | ICD-10-CM

## 2021-08-24 NOTE — Therapy (Signed)
[facility] [address] [place], [address] Phone: [phone]   Fax:  [phone]  Physical Therapy Treatment  Patient Details  Name: Michelle Ruiz MRN: [id number] Date of Birth: 03-Nov-1970 No data recorded  Encounter Date: 08/24/2021   PT End of Session - 08/24/21 1604     Visit Number 9    Number of Visits 16    Date for PT Re-Evaluation 09/07/21    Authorization Type BCBS COMM Pro    Authorization Time Period 06/15/21-09/07/21    Authorization - Visit Number 9    Progress Note Due on Visit 10    PT Start Time 6812    PT Stop Time 1600    PT Time Calculation (min) 45 min    Activity Tolerance Patient tolerated treatment well;Patient limited by fatigue    Behavior During Therapy [facility] for tasks assessed/performed             Past Medical History:  Diagnosis Date   Abnormal Pap smear of cervix    Actinic keratosis    Anxiety    Basal cell carcinoma 04/14/2008   Right upper ant. thigh. BCC with sclerosis.   Basal cell carcinoma 08/14/2008   Right lat. lower thigh. Superficial.    Basal cell carcinoma 04/29/2019   Right mid forearm. Superficial and nodular patterns. EDC.   Basal cell carcinoma 08/17/2020   Right upper antecubital. EDC.   Kidney stones    MS (multiple sclerosis) ([place])    UTI (urinary tract infection)     Past Surgical History:  Procedure Laterality Date   BREAST BIOPSY Right 2010   stereotactic biopsy/ neg/ dr.byrnett   BREAST BIOPSY Left 2012   neg/dr. brynett   CESAREAN SECTION  2008   [facility]   TONSILLECTOMY  2006    There were no vitals filed for this visit.      There.ex:   Recumbent bike interval training - 59mn level 6, 60 seconds level 8, 177m level 6, 45sec level 9, 65m7mlevel 6, 30sec level 10  Holding 5# DB overhead alt hip flex 2x 12 with DB in each hand with cuing for core activation with good carry over   TRX alt lunges 2x 12 with BUE support with  good carry over of technique  Alt heel taps to 6in cone on 3in step 2x 12 with excellent carry over of cuing for "light heel tap" with brace on  Ambulation around gym [id number] good carry over of heel strike and foot clearance from therex- min cuing to increase L stance time and R step length to increase control of R foot flat with good carry over                           PT Education - 08/24/21 1603     Education Details therex form/technique, carry over into gait    Person(s) Educated Patient    Methods Explanation;Demonstration;Verbal cues    Comprehension Returned demonstration;Verbalized understanding;Verbal cues required                 PT Long Term Goals - 06/15/21 1853       PT LONG TERM GOAL #1   Title Patient will be independent with balance HEP to maximize safety in ADL, IADL, and fitness activity    Baseline walking only    Time 4    Period Weeks    Status New  Target Date 07/13/21      PT LONG TERM GOAL #2   Title Pt to demonstrate improvement in minibesTest >3 points to demonstrate improved posture control and reduced falls risk.    Baseline pending to visit 2    Time 5    Period Weeks    Status New    Target Date 07/20/21      PT LONG TERM GOAL #3   Title Pt to perform SLS >15 sec bilat to indicate improved single limb motor control in gait.    Baseline 02/25/2019: 14sec (retest deferred to visit 2)    Time 6    Period Weeks    Status New    Target Date 07/27/21      PT LONG TERM GOAL #4   Title Pt will improve 6 MWT >1569f to demonstrate improved cardiorespiratory fitness    Baseline (02/15/21 11078f 06/15/21: 110041fgait belt, no AD)    Time 7    Period Weeks    Status New    Target Date 08/03/21      PT LONG TERM GOAL #5   Title Pt to demonstrate 30sec chair rise >13 to indicate improved power in BLE.    Baseline defer to visit 2    Time 8    Period Weeks    Status New    Target Date 08/10/21                    Plan - 08/24/21 1604     Clinical Impression Statement PT continued therex progression for increased general strengtening and motor sequencing needed to reduce fall risk and normalize gait with success. Patient is able to comply with all cuing for proper technique of therex with excellent motivation throughout session, rest breaks provided throughout to prevent over fatigue. Pt with better active DF with brace, encouraged to wear outside of PT on walks. PT will continue progression as able.    Personal Factors and Comorbidities Past/Current Experience;Fitness;Behavior Pattern    Examination-Activity Limitations Locomotion Level;Transfers;Bed Mobility;Stand;Squat    Examination-Participation Restrictions Community Activity;Yard Work    Stability/Clinical Decision Making Evolving/Moderate complexity    Clinical Decision Making Moderate    Rehab Potential Good    Clinical Impairments Affecting Rehab Potential (-) hx of MS; (+) good motivation    PT Frequency 2x / week    PT Duration 12 weeks    PT Treatment/Interventions Iontophoresis '4mg'$ /ml Dexamethasone;Electrical Stimulation;Aquatic Therapy;Cryotherapy;Therapeutic activities;Neuromuscular re-education;Therapeutic exercise;Patient/family education;Dry needling;Manual techniques;Passive range of motion;Balance training;Gait training;Stair training;Joint Manipulations;Functional mobility training    PT Next Visit Plan strengthening, balance, endurance    PT Home Exercise Plan walking for fitness    Consulted and Agree with Plan of Care Patient             Patient will benefit from skilled therapeutic intervention in order to improve the following deficits and impairments:  Pain, Decreased coordination, Impaired perceived functional ability, Increased fascial restricitons, Increased muscle spasms, Difficulty walking, Abnormal gait, Decreased balance, Decreased mobility, Impaired sensation, Increased edema, Cardiopulmonary status  limiting activity  Visit Diagnosis: Difficulty in walking, not elsewhere classified  History of falling     Problem List There are no problems to display for this patient.  [name] [name] 08/24/2021, 4:06 PM  [facility] [address],[place]one: [phone]:  [phone]: Michelle Ruiz: [phone] of Birth: 8/105/03/1970

## 2021-08-29 ENCOUNTER — Telehealth: Payer: Self-pay

## 2021-08-29 ENCOUNTER — Ambulatory Visit: Payer: BC Managed Care – PPO | Attending: Neurology | Admitting: Physical Therapy

## 2021-08-29 ENCOUNTER — Encounter: Payer: Self-pay | Admitting: Physical Therapy

## 2021-08-29 DIAGNOSIS — R262 Difficulty in walking, not elsewhere classified: Secondary | ICD-10-CM | POA: Diagnosis present

## 2021-08-29 DIAGNOSIS — M6281 Muscle weakness (generalized): Secondary | ICD-10-CM | POA: Insufficient documentation

## 2021-08-29 DIAGNOSIS — Z9181 History of falling: Secondary | ICD-10-CM | POA: Insufficient documentation

## 2021-08-29 NOTE — Telephone Encounter (Signed)
Advised pt of bx results/sh ?

## 2021-08-29 NOTE — Telephone Encounter (Signed)
-----   Message from Brendolyn Patty, MD sent at 08/29/2021 11:16 AM EDT ----- Skin , left lower leg posterior SUPERFICIAL BASAL CELL CARCINOMA, PERIPHERAL MARGIN INVOLVED  BCC skin cancer- already treated with EDC at time of biopsy    - please call patient

## 2021-08-29 NOTE — Therapy (Signed)
[facility] [address], [place], [address] Phone: [phone]   Fax:  [phone]  Physical Therapy Treatment  Patient Details  Name: Michelle Ruiz MRN: [id number] Date of Birth: [birth year] No data recorded  Encounter Date: 08/29/2021   PT End of Session - 08/29/21 1513     Visit Number 10    Number of Visits 16    Date for PT Re-Evaluation 09/07/21    Authorization Type [employer]    Authorization Time Period 06/15/21-09/07/21    Authorization - Visit Number 10    Progress Note Due on Visit 10    PT Start Time 1430    PT Stop Time 1511    PT Time Calculation (min) 41 min    Equipment Utilized During Treatment Gait belt    Activity Tolerance Patient tolerated treatment well;Patient limited by fatigue    Behavior During Therapy WFL for tasks assessed/performed             Past Medical History:  Diagnosis Date   Abnormal Pap smear of cervix    Actinic keratosis    Anxiety    Basal cell carcinoma 04/14/2008   Right upper ant. thigh. BCC with sclerosis.   Basal cell carcinoma 08/14/2008   Right lat. lower thigh. Superficial.    Basal cell carcinoma 04/29/2019   Right mid forearm. Superficial and nodular patterns. EDC.   Basal cell carcinoma 08/17/2020   Right upper antecubital. EDC.   Basal cell carcinoma 08/23/2021   Left Lower Leg, EDc   Kidney stones    MS (multiple sclerosis) ([place])    UTI (urinary tract infection)     Past Surgical History:  Procedure Laterality Date   BREAST BIOPSY Right 2010   stereotactic biopsy/ neg/ dr.byrnett   BREAST BIOPSY Left 2012   neg/dr. brynett   CESAREAN SECTION  2008   [facility]   TONSILLECTOMY  2006    There were no vitals filed for this visit.   Subjective Assessment - 08/29/21 1454     Subjective Doing well overall. Reports some increased RLE soreness following wearing brace on L ankle during walk following last PT session    Pertinent History  Patient reports history of falls x3 in the past 6 months secondary to drop foot on the L LE. PMH: Multiple sclerosis; elbow fracture of the R UE    Limitations Standing;Walking;Lifting    How long can you stand comfortably? 20 min    How long can you walk comfortably? 1 miles    Patient Stated Goals To be more balanced and core strengthening     Pain Onset More than a month ago               There.ex:   Recumbent bike interval training - 50mn level 5, 60 seconds level 8, 1721m level 5, 45sec level 8, 21m60mlevel 5, 30sec level 9   Step up onto 8in step 2x 6/7 (6/7 each side) with cuing to "not push off from toes"   Alt lateral step down 8in step 2x 12 with BUE support "light touch" supervision for safety  Walking lunges [id number] BUE flex with small theraball overhead x16ft28fA with GB for safety                          PT Education - 08/29/21 1512     Education Details therex form/technique  Person(s) Educated Patient    Methods Explanation;Demonstration;Verbal cues    Comprehension Verbalized understanding;Returned demonstration;Verbal cues required                 PT Long Term Goals - 06/15/21 1853       PT LONG TERM GOAL #1   Title Patient will be independent with balance HEP to maximize safety in ADL, IADL, and fitness activity    Baseline walking only    Time 4    Period Weeks    Status New    Target Date 07/13/21      PT LONG TERM GOAL #2   Title Pt to demonstrate improvement in minibesTest >3 points to demonstrate improved posture control and reduced falls risk.    Baseline pending to visit 2    Time 5    Period Weeks    Status New    Target Date 07/20/21      PT LONG TERM GOAL #3   Title Pt to perform SLS >15 sec bilat to indicate improved single limb motor control in gait.    Baseline 02/25/2019: 14sec (retest deferred to visit 2)    Time 6    Period Weeks    Status New    Target Date 07/27/21      PT LONG TERM GOAL  #4   Title Pt will improve 6 MWT >1564f to demonstrate improved cardiorespiratory fitness    Baseline (02/15/21 11059f 06/15/21: 110033fgait belt, no AD)    Time 7    Period Weeks    Status New    Target Date 08/03/21      PT LONG TERM GOAL #5   Title Pt to demonstrate 30sec chair rise >13 to indicate improved power in BLE.    Baseline defer to visit 2    Time 8    Period Weeks    Status New    Target Date 08/10/21                   Plan - 08/29/21 1531     Clinical Impression Statement PT continued therex progression for BLE and core strength with continued focus on brief SLS support for ambulation safety with success. Patient is able to comply with all cuing for proper technique with cuing for technique and gaurding for safety. Pt with slight regression of bike intervals, and increased rest breaks today due to fatigue. Reassessment to be completed next week per cert date. PT will continue progression as able.    Personal Factors and Comorbidities Past/Current Experience;Fitness;Behavior Pattern    Examination-Activity Limitations Locomotion Level;Transfers;Bed Mobility;Stand;Squat    Examination-Participation Restrictions Community Activity;Yard Work    Stability/Clinical Decision Making Evolving/Moderate complexity    Clinical Decision Making Moderate    Rehab Potential Good    Clinical Impairments Affecting Rehab Potential (-) hx of MS; (+) good motivation    PT Frequency 2x / week    PT Duration 12 weeks    PT Treatment/Interventions Iontophoresis '4mg'$ /ml Dexamethasone;Electrical Stimulation;Aquatic Therapy;Cryotherapy;Therapeutic activities;Neuromuscular re-education;Therapeutic exercise;Patient/family education;Dry needling;Manual techniques;Passive range of motion;Balance training;Gait training;Stair training;Joint Manipulations;Functional mobility training    PT Next Visit Plan strengthening, balance, endurance    PT Home Exercise Plan walking for fitness     Consulted and Agree with Plan of Care Patient             Patient will benefit from skilled therapeutic intervention in order to improve the following deficits and impairments:  Pain, Decreased coordination, Impaired perceived functional ability,  Increased fascial restricitons, Increased muscle spasms, Difficulty walking, Abnormal gait, Decreased balance, Decreased mobility, Impaired sensation, Increased edema, Cardiopulmonary status limiting activity  Visit Diagnosis: Difficulty in walking, not elsewhere classified     Problem List There are no problems to display for this patient.  Durwin Reges DPT Durwin Reges, PT 08/29/2021, 3:35 PM  [facility] [address], [place], [address] Phone: [phone]   Fax:  [phone]  Name: Michelle Ruiz MRN: [id number] Date of Birth: 27-Sep-1970

## 2021-08-30 ENCOUNTER — Encounter: Payer: BC Managed Care – PPO | Admitting: Physical Therapy

## 2021-09-05 ENCOUNTER — Encounter: Payer: Self-pay | Admitting: Physical Therapy

## 2021-09-05 ENCOUNTER — Ambulatory Visit: Payer: BC Managed Care – PPO | Admitting: Physical Therapy

## 2021-09-05 DIAGNOSIS — R262 Difficulty in walking, not elsewhere classified: Secondary | ICD-10-CM | POA: Diagnosis not present

## 2021-09-05 NOTE — Therapy (Signed)
[facility] [address], [place], [address] Phone: [phone]   Fax:  [phone]  Physical Therapy Treatment/Reassessment Progress Note 3/22/223 - 09/05/21  Patient Details  Name: Michelle Ruiz MRN: [id number] Date of Birth: 12-21-1970 No data recorded  Encounter Date: 09/05/2021   PT End of Session - 09/05/21 1603     Visit Number 11    Number of Visits 28    Date for PT Re-Evaluation 11/28/21    Authorization - Visit Number 1    Progress Note Due on Visit 10    PT Start Time 1515    PT Stop Time 1600    PT Time Calculation (min) 45 min    Equipment Utilized During Treatment Gait belt    Activity Tolerance Patient tolerated treatment well;Patient limited by fatigue    Behavior During Therapy WFL for tasks assessed/performed             Past Medical History:  Diagnosis Date   Abnormal Pap smear of cervix    Actinic keratosis    Anxiety    Basal cell carcinoma 04/14/2008   Right upper ant. thigh. BCC with sclerosis.   Basal cell carcinoma 08/14/2008   Right lat. lower thigh. Superficial.    Basal cell carcinoma 04/29/2019   Right mid forearm. Superficial and nodular patterns. EDC.   Basal cell carcinoma 08/17/2020   Right upper antecubital. EDC.   Basal cell carcinoma 08/23/2021   Left Lower Leg, EDc   Kidney stones    MS (multiple sclerosis) ([place])    UTI (urinary tract infection)     Past Surgical History:  Procedure Laterality Date   BREAST BIOPSY Right 2010   stereotactic biopsy/ neg/ dr.byrnett   BREAST BIOPSY Left 2012   neg/dr. brynett   CESAREAN SECTION  2008   [facility]   TONSILLECTOMY  2006    There were no vitals filed for this visit.   Subjective Assessment - 09/05/21 1543     Subjective Doing well overall. Has started wearing brace during night time walks. Unsure if it has been helpful. Had an exacerbation of symptoms over the weekend.    Patient is accompained by:  Family member    Pertinent History Patient reports history of falls x3 in the past 6 months secondary to drop foot on the L LE. PMH: Multiple sclerosis; elbow fracture of the R UE    Limitations Standing;Walking;Lifting    How long can you stand comfortably? 20 min    How long can you walk comfortably? 1 miles    Patient Stated Goals To be more balanced and core strengthening     Pain Onset More than a month ago                 Ther-Ex Mini Best test with guarding and appropriate rest breaks needed. Pt educated on purpose of test, reflection of fall risk, etc  30sec STS 2x trials  Education on HEP cardiovascular/muscular endurance 5 days/week 10mns between walking and biking; and completing strengthening 2-3x/week with a program she enjoys (pilates?). Patient questions/concerns answered   Unbilled: 6MWT                       PT Education - 09/05/21 1603     Education Details HEP, reassessment    Person(s) Educated Patient    Methods Explanation;Demonstration;Verbal cues    Comprehension Verbalized understanding;Returned demonstration;Verbal cues required  PT Long Term Goals - 09/05/21 1545       PT LONG TERM GOAL #1   Title Patient will be independent with balance HEP to maximize safety in ADL, IADL, and fitness activity    Baseline walking only; 09/05/21 is walking for exercise    Time 4    Period Weeks    Status Achieved      PT LONG TERM GOAL #2   Title Pt to demonstrate improvement in minibesTest >3 points to demonstrate improved posture control and reduced falls risk.    Baseline 09/05/21 20/28    Time 5    Period Weeks    Status New    Target Date 07/20/21      PT LONG TERM GOAL #3   Title Pt to perform SLS >15 sec bilat to indicate improved single limb motor control in gait.    Baseline 02/25/2019: 14sec (retest deferred to visit 2); 09/05/21 R 20sec; L 7sec    Time 6    Period Weeks    Status On-going      PT  LONG TERM GOAL #4   Title Pt will improve 6 MWT >1565f to demonstrate improved cardiorespiratory fitness    Baseline 06/15/21: 11042f(gait belt, no AD);  09/05/21: 120068fgait belt, no AD)    Time 7    Period Weeks    Status On-going    Target Date 11/25/21      PT LONG TERM GOAL #5   Title Pt to demonstrate 30sec chair rise >13 to indicate improved power in BLE.    Baseline 09/05/21 9    Time 8    Period Weeks    Status New                   Plan - 09/05/21 1558     Clinical Impression Statement Reassessment completed today where pt has made progress toward 6MWT goal, demonstrating b=increased muscular and cardiovascular endurance. PT led patient in 30sec STS and mini Best balance test to establish goals for continued progress to decrease fall risk and increase specifically BLE endurance, with patient able to complete with appropriate rest. Guarding throughout session for safety. PT reviewed current exercise regimen with patietn with educationon parameters of enduracne vs. strength based training with patient verbalizing understanding.    Personal Factors and Comorbidities Past/Current Experience;Fitness;Behavior Pattern    Examination-Activity Limitations Locomotion Level;Transfers;Bed Mobility;Stand;Squat    Examination-Participation Restrictions Community Activity;Yard Work    Stability/Clinical Decision Making Evolving/Moderate complexity    Clinical Decision Making Moderate    Rehab Potential Good    Clinical Impairments Affecting Rehab Potential (-) hx of MS; (+) good motivation    PT Frequency 2x / week    PT Duration 12 weeks    PT Treatment/Interventions Iontophoresis '4mg'$ /ml Dexamethasone;Electrical Stimulation;Aquatic Therapy;Cryotherapy;Therapeutic activities;Neuromuscular re-education;Therapeutic exercise;Patient/family education;Dry needling;Manual techniques;Passive range of motion;Balance training;Gait training;Stair training;Joint Manipulations;Functional  mobility training    PT Next Visit Plan strengthening, balance, endurance    PT Home Exercise Plan walking for fitness    Consulted and Agree with Plan of Care Patient             Patient will benefit from skilled therapeutic intervention in order to improve the following deficits and impairments:  Pain, Decreased coordination, Impaired perceived functional ability, Increased fascial restricitons, Increased muscle spasms, Difficulty walking, Abnormal gait, Decreased balance, Decreased mobility, Impaired sensation, Increased edema, Cardiopulmonary status limiting activity  Visit Diagnosis: Difficulty in walking, not elsewhere classified  Problem List There are no problems to display for this patient.  Durwin Reges DPT Durwin Reges, PT 09/05/2021, 4:06 PM  [facility] [address], [place], [address] Phone: [phone]   Fax:  [phone]  Name: Michelle Ruiz MRN: [id number] Date of Birth: August 29, 1970

## 2021-09-08 ENCOUNTER — Other Ambulatory Visit: Payer: Self-pay | Admitting: Student

## 2021-09-08 DIAGNOSIS — G35 Multiple sclerosis: Secondary | ICD-10-CM

## 2021-09-15 ENCOUNTER — Encounter: Payer: Self-pay | Admitting: Physical Therapy

## 2021-09-15 ENCOUNTER — Ambulatory Visit: Payer: BC Managed Care – PPO

## 2021-09-15 DIAGNOSIS — R262 Difficulty in walking, not elsewhere classified: Secondary | ICD-10-CM | POA: Diagnosis not present

## 2021-09-15 DIAGNOSIS — Z9181 History of falling: Secondary | ICD-10-CM

## 2021-09-15 DIAGNOSIS — M6281 Muscle weakness (generalized): Secondary | ICD-10-CM

## 2021-09-15 NOTE — Therapy (Signed)
[facility] [address] [place], [address] Phone: [phone]   Fax:  [phone]  Physical Therapy Treatment  Patient Details  Name: Michelle Ruiz MRN: [id number] Date of Birth: 12-Feb-1971 No data recorded  Encounter Date: 09/15/2021   PT End of Session - 09/15/21 1608     Visit Number 12    Number of Visits 28    Date for PT Re-Evaluation 11/28/21    Authorization - Visit Number 2    Progress Note Due on Visit 10    PT Start Time 1602    PT Stop Time 1642    PT Time Calculation (min) 40 min    Equipment Utilized During Treatment Gait belt    Activity Tolerance Patient tolerated treatment well;Patient limited by fatigue    Behavior During Therapy WFL for tasks assessed/performed             Past Medical History:  Diagnosis Date   Abnormal Pap smear of cervix    Actinic keratosis    Anxiety    Basal cell carcinoma 04/14/2008   Right upper ant. thigh. BCC with sclerosis.   Basal cell carcinoma 08/14/2008   Right lat. lower thigh. Superficial.    Basal cell carcinoma 04/29/2019   Right mid forearm. Superficial and nodular patterns. EDC.   Basal cell carcinoma 08/17/2020   Right upper antecubital. EDC.   Basal cell carcinoma 08/23/2021   Left Lower Leg, EDc   Kidney stones    MS (multiple sclerosis) ([place])    UTI (urinary tract infection)     Past Surgical History:  Procedure Laterality Date   BREAST BIOPSY Right 2010   stereotactic biopsy/ neg/ dr.byrnett   BREAST BIOPSY Left 2012   neg/dr. brynett   CESAREAN SECTION  2008   [facility]   TONSILLECTOMY  2006    There were no vitals filed for this visit.   Subjective Assessment - 09/15/21 1606     Subjective Pt reports plan from neurologist. Has not been able to walk much because of the weather. Denies falls, has not been wearing ankle brace due to inability to walk outside due to weather.    Patient is accompained by: Family member     Pertinent History Patient reports history of falls x3 in the past 6 months secondary to drop foot on the L LE. PMH: Multiple sclerosis; elbow fracture of the R UE    Limitations Standing;Walking;Lifting    How long can you stand comfortably? 20 min    How long can you walk comfortably? 1 miles    Patient Stated Goals To be more balanced and core strengthening     Currently in Pain? No/denies    Pain Onset More than a month ago              There.ex:   Recumbent bike: 5 minutes total. 2 minutes L4, 1 min 30 sec level 6. 1 minute cool down on L4.   8" running mans with SUE support: 2x5/LE.   STS with airex pad under feet: 2x6, CGA with VC's for eccentric control  Hurdle stepover onto airex pad to step down over hurdle (airex pad sandwich with yellow hurdles):   Forwards: x10  Side steps: x10/direction (R/L)  Mini lunges onto BOSU ball: SUE support, 2x5/LE     Seated rest breaks provided between exercises for recovery b/t exercises.    PT Education - 09/15/21 1608     Education Details  form/technique with exercise    Person(s) Educated Patient    Methods Explanation;Demonstration;Verbal cues    Comprehension Verbalized understanding;Returned demonstration                 PT Long Term Goals - 09/05/21 1545       PT LONG TERM GOAL #1   Title Patient will be independent with balance HEP to maximize safety in ADL, IADL, and fitness activity    Baseline walking only; 09/05/21 is walking for exercise    Time 4    Period Weeks    Status Achieved      PT LONG TERM GOAL #2   Title Pt to demonstrate improvement in minibesTest >3 points to demonstrate improved posture control and reduced falls risk.    Baseline 09/05/21 20/28    Time 5    Period Weeks    Status New    Target Date 07/20/21      PT LONG TERM GOAL #3   Title Pt to perform SLS >15 sec bilat to indicate improved single limb motor control in gait.    Baseline 02/25/2019: 14sec (retest deferred to visit  2); 09/05/21 R 20sec; L 7sec    Time 6    Period Weeks    Status On-going      PT LONG TERM GOAL #4   Title Pt will improve 6 MWT >1511f to demonstrate improved cardiorespiratory fitness    Baseline 06/15/21: 11038f(gait belt, no AD);  09/05/21: 120052fgait belt, no AD)    Time 7    Period Weeks    Status On-going    Target Date 11/25/21      PT LONG TERM GOAL #5   Title Pt to demonstrate 30sec chair rise >13 to indicate improved power in BLE.    Baseline 09/05/21 9    Time 8    Period Weeks    Status New                   Plan - 09/15/21 1613     Clinical Impression Statement Pt generally fatigued due to recent onsets of MS exacerbations. Performing low repetitions with frequent seated rest breaks as needed. Pt will continue to benefit from skilled PT services to progress balance and strength to pt toelrance to decrease risk of falls.    Personal Factors and Comorbidities Past/Current Experience;Fitness;Behavior Pattern    Examination-Activity Limitations Locomotion Level;Transfers;Bed Mobility;Stand;Squat    Examination-Participation Restrictions Community Activity;Yard Work    StaMerchant navy officerolving/Moderate complexity    Rehab Potential Good    Clinical Impairments Affecting Rehab Potential (-) hx of MS; (+) good motivation    PT Frequency 2x / week    PT Duration 12 weeks    PT Treatment/Interventions Iontophoresis '4mg'$ /ml Dexamethasone;Electrical Stimulation;Aquatic Therapy;Cryotherapy;Therapeutic activities;Neuromuscular re-education;Therapeutic exercise;Patient/family education;Dry needling;Manual techniques;Passive range of motion;Balance training;Gait training;Stair training;Joint Manipulations;Functional mobility training    PT Next Visit Plan strengthening, balance, endurance    PT Home Exercise Plan walking for fitness    Consulted and Agree with Plan of Care Patient             Patient will benefit from skilled therapeutic  intervention in order to improve the following deficits and impairments:  Pain, Decreased coordination, Impaired perceived functional ability, Increased fascial restricitons, Increased muscle spasms, Difficulty walking, Abnormal gait, Decreased balance, Decreased mobility, Impaired sensation, Increased edema, Cardiopulmonary status limiting activity  Visit Diagnosis: Difficulty in walking, not elsewhere classified  History of falling  Muscle weakness (  generalized)     Problem List There are no problems to display for this patient.   Salem Caster. Fairly IV, PT, DPT Physical Therapist- [facility]  09/15/2021, 4:47 PM  [facility] [address], [place], [address] Phone: [phone]   Fax:  [phone]  Name: BRYLINN TEANEY MRN: [id number] Date of Birth: 04-05-1970

## 2021-09-16 ENCOUNTER — Ambulatory Visit
Admission: RE | Admit: 2021-09-16 | Discharge: 2021-09-16 | Disposition: A | Discharge status: Home / Self Care | Payer: BC Managed Care – PPO | Source: Ambulatory Visit | Attending: Internal Medicine | Admitting: Internal Medicine

## 2021-09-16 DIAGNOSIS — Z1231 Encounter for screening mammogram for malignant neoplasm of breast: Secondary | ICD-10-CM | POA: Insufficient documentation

## 2021-09-20 ENCOUNTER — Ambulatory Visit: Payer: BC Managed Care – PPO | Admitting: Physical Therapy

## 2021-09-20 ENCOUNTER — Encounter: Payer: Self-pay | Admitting: Physical Therapy

## 2021-09-20 DIAGNOSIS — R262 Difficulty in walking, not elsewhere classified: Secondary | ICD-10-CM | POA: Diagnosis not present

## 2021-09-20 NOTE — Therapy (Signed)
[facility] [address], [place], [address] Phone: [phone]   Fax:  [phone]  Physical Therapy Treatment  Patient Details  Name: Michelle Ruiz MRN: [id number] Date of Birth: 02/16/1971 No data recorded  Encounter Date: 09/20/2021   PT End of Session - 09/20/21 1543     Visit Number 13    Number of Visits 28    Date for PT Re-Evaluation 11/28/21    Authorization Type BCBS COMM Pro    Authorization Time Period 06/15/21-09/07/21    Authorization - Visit Number 3    Progress Note Due on Visit 10    PT Start Time 1515    PT Stop Time 1555    PT Time Calculation (min) 40 min    Activity Tolerance Patient tolerated treatment well;Patient limited by fatigue    Behavior During Therapy [facility] for tasks assessed/performed             Past Medical History:  Diagnosis Date   Abnormal Pap smear of cervix    Actinic keratosis    Anxiety    Basal cell carcinoma 04/14/2008   Right upper ant. thigh. BCC with sclerosis.   Basal cell carcinoma 08/14/2008   Right lat. lower thigh. Superficial.    Basal cell carcinoma 04/29/2019   Right mid forearm. Superficial and nodular patterns. EDC.   Basal cell carcinoma 08/17/2020   Right upper antecubital. EDC.   Basal cell carcinoma 08/23/2021   Left Lower Leg, EDc   Kidney stones    MS (multiple sclerosis) ([place])    UTI (urinary tract infection)     Past Surgical History:  Procedure Laterality Date   BREAST BIOPSY Right 2010   stereotactic biopsy/ neg/ dr.byrnett   BREAST BIOPSY Left 2012   neg/dr. brynett   CESAREAN SECTION  2008   [facility]   TONSILLECTOMY  2006    There were no vitals filed for this visit.   Subjective Assessment - 09/20/21 1531     Subjective Pt reports minimal brace wearing since last visit. Has been more fatigued following shingles vaccine    Patient is accompained by: Family member    Pertinent History Patient reports history of falls  x3 in the past 6 months secondary to drop foot on the L LE. PMH: Multiple sclerosis; elbow fracture of the R UE    Limitations Standing;Walking;Lifting    How long can you stand comfortably? 20 min    How long can you walk comfortably? 1 miles    Patient Stated Goals To be more balanced and core strengthening     Pain Onset More than a month ago              There.ex:    Recumbent bike: 5 minutes total. 2 minutes L4, 1 min 30 sec level 6. 1 minute cool down on L4.    STS with airex pad under feet: 2x6, CGA with VC's for eccentric control  Lateral step up/downs onto 8in step x12 (over and back = 1 rep) with supervision for safety  Bear crawls 2x 66f   Alt mini lunges onto BOSU ball: SUE support, 2x 12 with supervision for safety, good carry over of demo        Seated rest breaks provided between exercises for recovery b/t exercises.  PT Education - 09/20/21 1543     Education Details therex technique/form    Person(s) Educated Patient    Methods Explanation;Demonstration;Verbal cues    Comprehension Returned demonstration;Verbalized understanding;Verbal cues required                 PT Long Term Goals - 09/05/21 1545       PT LONG TERM GOAL #1   Title Patient will be independent with balance HEP to maximize safety in ADL, IADL, and fitness activity    Baseline walking only; 09/05/21 is walking for exercise    Time 4    Period Weeks    Status Achieved      PT LONG TERM GOAL #2   Title Pt to demonstrate improvement in minibesTest >3 points to demonstrate improved posture control and reduced falls risk.    Baseline 09/05/21 20/28    Time 5    Period Weeks    Status New    Target Date 07/20/21      PT LONG TERM GOAL #3   Title Pt to perform SLS >15 sec bilat to indicate improved single limb motor control in gait.    Baseline 02/25/2019: 14sec (retest deferred to visit 2); 09/05/21 R 20sec; L 7sec    Time 6     Period Weeks    Status On-going      PT LONG TERM GOAL #4   Title Pt will improve 6 MWT >1569f to demonstrate improved cardiorespiratory fitness    Baseline 06/15/21: 11085f(gait belt, no AD);  09/05/21: 120095fgait belt, no AD)    Time 7    Period Weeks    Status On-going    Target Date 11/25/21      PT LONG TERM GOAL #5   Title Pt to demonstrate 30sec chair rise >13 to indicate improved power in BLE.    Baseline 09/05/21 9    Time 8    Period Weeks    Status New                   Plan - 09/21/21 1208     Clinical Impression Statement PT continued therex progression for increased motor control, strength and endurance for fall reduction and icnreased ind of ADLs. Pt with increased rest between sets of therex to prevent over fatigue. Pt is able to comply with all cuing for proper technique of therex with excellent motivation throughout session. Guarding throughout session for safety. PT will continue progression as sable.    Personal Factors and Comorbidities Past/Current Experience;Fitness;Behavior Pattern    Examination-Activity Limitations Locomotion Level;Transfers;Bed Mobility;Stand;Squat    Examination-Participation Restrictions Community Activity;Yard Work    StaMerchant navy officerolving/Moderate complexity    Clinical Decision Making Moderate    Rehab Potential Good    Clinical Impairments Affecting Rehab Potential (-) hx of MS; (+) good motivation    PT Frequency 2x / week    PT Duration 12 weeks    PT Treatment/Interventions Iontophoresis '4mg'$ /ml Dexamethasone;Electrical Stimulation;Aquatic Therapy;Cryotherapy;Therapeutic activities;Neuromuscular re-education;Therapeutic exercise;Patient/family education;Dry needling;Manual techniques;Passive range of motion;Balance training;Gait training;Stair training;Joint Manipulations;Functional mobility training    PT Next Visit Plan strengthening, balance, endurance    PT Home Exercise Plan walking for  fitness    Consulted and Agree with Plan of Care Patient             Patient will benefit from skilled therapeutic intervention in order to improve the following deficits and impairments:  Pain, Decreased coordination, Impaired perceived functional ability, Increased  fascial restricitons, Increased muscle spasms, Difficulty walking, Abnormal gait, Decreased balance, Decreased mobility, Impaired sensation, Increased edema, Cardiopulmonary status limiting activity  Visit Diagnosis: No diagnosis found.     Problem List There are no problems to display for this patient.  Durwin Reges DPT Durwin Reges, PT 09/21/2021, 12:11 PM  [facility] [address], [place], [address] Phone: [phone]   Fax:  [phone]  Name: Michelle Ruiz MRN: [id number] Date of Birth: [birth year]

## 2021-09-28 ENCOUNTER — Encounter: Payer: Self-pay | Admitting: Physical Therapy

## 2021-09-28 ENCOUNTER — Ambulatory Visit: Payer: BC Managed Care – PPO | Attending: Neurology | Admitting: Physical Therapy

## 2021-09-28 DIAGNOSIS — Z9181 History of falling: Secondary | ICD-10-CM

## 2021-09-28 DIAGNOSIS — M6281 Muscle weakness (generalized): Secondary | ICD-10-CM | POA: Diagnosis present

## 2021-09-28 DIAGNOSIS — R262 Difficulty in walking, not elsewhere classified: Secondary | ICD-10-CM

## 2021-09-28 NOTE — Therapy (Signed)
[facility] [address] [place], [address] Phone: [phone]   Fax:  [phone]  Physical Therapy Treatment  Patient Details  Name: Michelle Ruiz MRN: [id number] Date of Birth: Jun 11, 1970 No data recorded  Encounter Date: 09/28/2021   PT End of Session - 09/28/21 1525     Visit Number 14    Number of Visits 28    Date for PT Re-Evaluation 11/28/21    Authorization Type BCBS COMM Pro    Authorization Time Period 06/15/21-09/07/21    Authorization - Visit Number 4    Progress Note Due on Visit 10    PT Start Time 1696    PT Stop Time 1550    PT Time Calculation (min) 39 min    Equipment Utilized During Treatment Gait belt    Activity Tolerance Patient tolerated treatment well;Patient limited by fatigue    Behavior During Therapy WFL for tasks assessed/performed             Past Medical History:  Diagnosis Date   Abnormal Pap smear of cervix    Actinic keratosis    Anxiety    Basal cell carcinoma 04/14/2008   Right upper ant. thigh. BCC with sclerosis.   Basal cell carcinoma 08/14/2008   Right lat. lower thigh. Superficial.    Basal cell carcinoma 04/29/2019   Right mid forearm. Superficial and nodular patterns. EDC.   Basal cell carcinoma 08/17/2020   Right upper antecubital. EDC.   Basal cell carcinoma 08/23/2021   Left Lower Leg, EDc   Kidney stones    MS (multiple sclerosis) ([place])    UTI (urinary tract infection)     Past Surgical History:  Procedure Laterality Date   BREAST BIOPSY Right 2010   stereotactic biopsy/ neg/ dr.byrnett   BREAST BIOPSY Left 2012   neg/dr. brynett   CESAREAN SECTION  2008   [facility]   TONSILLECTOMY  2006    There were no vitals filed for this visit.   Subjective Assessment - 09/28/21 1521     Subjective Reports minimal brace wearing. Has been walking at night, but this has been more troublesome (meaning she has to focus more to not fall) d/t heat and  menstral cycle. No falls or trips since last visit    Patient is accompained by: Family member    Pertinent History Patient reports history of falls x3 in the past 6 months secondary to drop foot on the L LE. PMH: Multiple sclerosis; elbow fracture of the R UE    Limitations Standing;Walking;Lifting    How long can you stand comfortably? 20 min    How long can you walk comfortably? 1 miles    Patient Stated Goals To be more balanced and core strengthening     Pain Onset More than a month ago              There.ex:    Recumbent bike: 5 minutes total. 2 minutes L4, 1 min level 6; 41mn level 5; 1 minute cool down on L4.    Alt heel taps to 6in (on a 3in step) x20; standing of foam  x20 with noted fatigue at end of reps   Heel walking 226fx2 with difficulty maintaining on left, good effort  Step up to running man 8in step 2x 6 each- min cuing for DF of step up  Alt lateral step over 6in hurdle to 2sec SLS hold       Seated  rest breaks provided between exercises for recovery b/t exercises.                             PT Education - 09/28/21 1525     Education Details therex technique/form    Person(s) Educated Patient    Methods Explanation;Demonstration;Verbal cues    Comprehension Verbalized understanding;Returned demonstration;Verbal cues required                 PT Long Term Goals - 09/05/21 1545       PT LONG TERM GOAL #1   Title Patient will be independent with balance HEP to maximize safety in ADL, IADL, and fitness activity    Baseline walking only; 09/05/21 is walking for exercise    Time 4    Period Weeks    Status Achieved      PT LONG TERM GOAL #2   Title Pt to demonstrate improvement in minibesTest >3 points to demonstrate improved posture control and reduced falls risk.    Baseline 09/05/21 20/28    Time 5    Period Weeks    Status New    Target Date 07/20/21      PT LONG TERM GOAL #3   Title Pt to perform SLS >15 sec  bilat to indicate improved single limb motor control in gait.    Baseline 02/25/2019: 14sec (retest deferred to visit 2); 09/05/21 R 20sec; L 7sec    Time 6    Period Weeks    Status On-going      PT LONG TERM GOAL #4   Title Pt will improve 6 MWT >1549f to demonstrate improved cardiorespiratory fitness    Baseline 06/15/21: 11080f(gait belt, no AD);  09/05/21: 120073fgait belt, no AD)    Time 7    Period Weeks    Status On-going    Target Date 11/25/21      PT LONG TERM GOAL #5   Title Pt to demonstrate 30sec chair rise >13 to indicate improved power in BLE.    Baseline 09/05/21 9    Time 8    Period Weeks    Status New                   Plan - 09/28/21 2109     Clinical Impression Statement PT continued progression to decrease fall risk and increase motor control with success. Pt requires guarding for safety with all therex. Rest breaks utilized to prevent fatigue. Pt able to comply with all cuing for safety, with excellent motivation throughout session. PT will continue progression as able.    Personal Factors and Comorbidities Past/Current Experience;Fitness;Behavior Pattern    Examination-Activity Limitations Locomotion Level;Transfers;Bed Mobility;Stand;Squat    Examination-Participation Restrictions Community Activity;Yard Work    StaMerchant navy officerolving/Moderate complexity    Clinical Decision Making Moderate    Rehab Potential Good    Clinical Impairments Affecting Rehab Potential (-) hx of MS; (+) good motivation    PT Frequency 2x / week    PT Duration 12 weeks    PT Treatment/Interventions Iontophoresis '4mg'$ /ml Dexamethasone;Electrical Stimulation;Aquatic Therapy;Cryotherapy;Therapeutic activities;Neuromuscular re-education;Therapeutic exercise;Patient/family education;Dry needling;Manual techniques;Passive range of motion;Balance training;Gait training;Stair training;Joint Manipulations;Functional mobility training    PT Next Visit Plan  strengthening, balance, endurance    PT Home Exercise Plan walking for fitness             Patient will benefit from skilled therapeutic intervention in order to improve the  following deficits and impairments:  Pain, Decreased coordination, Impaired perceived functional ability, Increased fascial restricitons, Increased muscle spasms, Difficulty walking, Abnormal gait, Decreased balance, Decreased mobility, Impaired sensation, Increased edema, Cardiopulmonary status limiting activity  Visit Diagnosis: Difficulty in walking, not elsewhere classified  History of falling  Muscle weakness (generalized)     Problem List There are no problems to display for this patient.  Durwin Reges DPT Durwin Reges, PT 09/28/2021, 9:16 PM  [facility] [address] [place], [address] Phone: [phone]   Fax:  [phone]  Name: SYMPHANY FLEISSNER MRN: [id number] Date of Birth: January 22, 1971

## 2021-09-29 ENCOUNTER — Ambulatory Visit
Admission: RE | Admit: 2021-09-29 | Discharge: 2021-09-29 | Disposition: A | Discharge status: Home / Self Care | Payer: BC Managed Care – PPO | Source: Ambulatory Visit | Attending: Student | Admitting: Student

## 2021-09-29 DIAGNOSIS — G35 Multiple sclerosis: Secondary | ICD-10-CM

## 2021-09-29 MED ORDER — GADOBENATE DIMEGLUMINE 529 MG/ML IV SOLN
18.0000 mL | Freq: Once | INTRAVENOUS | Status: AC | PRN
  Administered 2021-09-29: 18 mL via INTRAVENOUS

## 2021-10-04 ENCOUNTER — Encounter: Payer: Self-pay | Admitting: Physical Therapy

## 2021-10-04 ENCOUNTER — Ambulatory Visit: Payer: BC Managed Care – PPO | Admitting: Physical Therapy

## 2021-10-04 DIAGNOSIS — R262 Difficulty in walking, not elsewhere classified: Secondary | ICD-10-CM

## 2021-10-04 NOTE — Therapy (Unsigned)
[facility] [address], [place], [address] Phone: [phone]   Fax:  [phone]  Physical Therapy Treatment  Patient Details  Name: Michelle Ruiz MRN: [id number] Date of Birth: 1971-01-18 No data recorded  Encounter Date: 10/04/2021    Past Medical History:  Diagnosis Date   Abnormal Pap smear of cervix    Actinic keratosis    Anxiety    Basal cell carcinoma 04/14/2008   Right upper ant. thigh. BCC with sclerosis.   Basal cell carcinoma 08/14/2008   Right lat. lower thigh. Superficial.    Basal cell carcinoma 04/29/2019   Right mid forearm. Superficial and nodular patterns. EDC.   Basal cell carcinoma 08/17/2020   Right upper antecubital. EDC.   Basal cell carcinoma 08/23/2021   Left Lower Leg, EDc   Kidney stones    MS (multiple sclerosis) ([place])    UTI (urinary tract infection)     Past Surgical History:  Procedure Laterality Date   BREAST BIOPSY Right 2010   stereotactic biopsy/ neg/ dr.byrnett   BREAST BIOPSY Left 2012   neg/dr. brynett   CESAREAN SECTION  2008   [facility]   TONSILLECTOMY  2006    There were no vitals filed for this visit.      There.ex:    Recumbent bike: 6 minutes total. 2 minutes L4, 1 min level 6; 89mn level 5; 1 minute cool down on L4.    4055fwith 8 hurdle negotiation per 10075fAlt heel taps to 6in (on a 3in step) x20; standing of foam  x20 with noted fatigue at end of reps    Heel walking 38f38f with difficulty maintaining on left, good effort   Step up to running man 8in step 2x 6 each- min cuing for DF of step up   Alt lateral step over 6in hurdle to 2sec SLS hold       Seated rest breaks provided between exercises for recovery b/t exercises.                               PT Long Term Goals - 09/05/21 1545       PT LONG TERM GOAL #1   Title Patient will be independent with balance HEP to maximize safety in ADL, IADL,  and fitness activity    Baseline walking only; 09/05/21 is walking for exercise    Time 4    Period Weeks    Status Achieved      PT LONG TERM GOAL #2   Title Pt to demonstrate improvement in minibesTest >3 points to demonstrate improved posture control and reduced falls risk.    Baseline 09/05/21 20/28    Time 5    Period Weeks    Status New    Target Date 07/20/21      PT LONG TERM GOAL #3   Title Pt to perform SLS >15 sec bilat to indicate improved single limb motor control in gait.    Baseline 02/25/2019: 14sec (retest deferred to visit 2); 09/05/21 R 20sec; L 7sec    Time 6    Period Weeks    Status On-going      PT LONG TERM GOAL #4   Title Pt will improve 6 MWT >1500ft16fdemonstrate improved cardiorespiratory fitness    Baseline 06/15/21: 1100ft 42ft belt, no AD);  09/05/21: 1200ft (57f belt, no AD)    Time  7    Period Weeks    Status On-going    Target Date 11/25/21      PT LONG TERM GOAL #5   Title Pt to demonstrate 30sec chair rise >13 to indicate improved power in BLE.    Baseline 09/05/21 9    Time 8    Period Weeks    Status New                    Patient will benefit from skilled therapeutic intervention in order to improve the following deficits and impairments:     Visit Diagnosis: No diagnosis found.     Problem List There are no problems to display for this patient.   Durwin Reges, PT 10/04/2021, 3:17 PM  [facility] [address], [place], [address] Phone: [phone]   Fax:  [phone]  Name: Michelle Ruiz MRN: [id number] Date of Birth: 1970-08-08

## 2021-10-11 ENCOUNTER — Ambulatory Visit: Payer: BC Managed Care – PPO | Admitting: Physical Therapy

## 2021-10-11 ENCOUNTER — Encounter: Payer: Self-pay | Admitting: Physical Therapy

## 2021-10-11 DIAGNOSIS — R262 Difficulty in walking, not elsewhere classified: Secondary | ICD-10-CM | POA: Diagnosis not present

## 2021-10-11 NOTE — Therapy (Signed)
OUTPATIENT PHYSICAL THERAPY TREATMENT NOTE   Patient Name: Michelle Ruiz MRN: [id number] DOB:[birth year], 51 y.o., female Today's Date: 10/12/2021  PCP: Halford Chessman PROVIDER: [employer]   PT End of Session - 10/11/21 1513     Visit Number 16    Number of Visits 28    Date for PT Re-Evaluation 11/28/21    Authorization Type BCBS COMM Pro    Authorization Time Period 06/15/21-09/07/21    Authorization - Visit Number 6    Progress Note Due on Visit 10    PT Start Time 1510    PT Stop Time 1548    PT Time Calculation (min) 38 min    Activity Tolerance Patient tolerated treatment well;Patient limited by fatigue    Behavior During Therapy [facility] for tasks assessed/performed             Past Medical History:  Diagnosis Date   Abnormal Pap smear of cervix    Actinic keratosis    Anxiety    Basal cell carcinoma 04/14/2008   Right upper ant. thigh. BCC with sclerosis.   Basal cell carcinoma 08/14/2008   Right lat. lower thigh. Superficial.    Basal cell carcinoma 04/29/2019   Right mid forearm. Superficial and nodular patterns. EDC.   Basal cell carcinoma 08/17/2020   Right upper antecubital. EDC.   Basal cell carcinoma 08/23/2021   Left Lower Leg, EDc   Kidney stones    MS (multiple sclerosis) ([place])    UTI (urinary tract infection)    Past Surgical History:  Procedure Laterality Date   BREAST BIOPSY Right 2010   stereotactic biopsy/ neg/ dr.byrnett   BREAST BIOPSY Left 2012   neg/dr. brynett   CESAREAN SECTION  2008   [place]   TONSILLECTOMY  2006   There are no problems to display for this patient.   REFERRING DIAG: MS  THERAPY DIAG:  Difficulty in walking, not elsewhere classified  Rationale for Evaluation and Treatment Rehabilitation  PERTINENT HISTORY: Patient reports history of falls x3 in the past 6 months secondary to drop foot on the L LE. PMH: Multiple sclerosis; elbow fracture of the R UE  PRECAUTIONS: fall  SUBJECTIVE:   PAIN:  Are  you having pain? No     TODAY'S TREATMENT:  There.ex:    Recumbent bike: 6 minutes total. 2 minutes L4, 1 min level 6; 21mn level 5; 125m level 6; 1 minute cool down on L4.    Treamill incline walking 1.22m38m 1mi722m% grade;  30sec on 6% grade 1min74m grade;  30sec on 7% grade 1min 25mgrade;  30sec on 8% grade  SL squat L21 total gym 2x 10 bilat   Mini squat with semi circle with slider 2x 12 each LE      Seated rest breaks provided between exercises for recovery b/t exercises.  10/04/21 There.ex:    Recumbent bike: 6 minutes total. 2 minutes L4, 1 min level 6; 1min l8ml 5; 1 minute cool down on L4.    400ft wi41f hurdle negotiation per 100ft sup14fsion for safety  Alt heel taps to 6in (on a 3in step) x20; standing of foam  x20 with noted fatigue at end of reps    Heel walking 30ft x2 w23fdifficulty maintaining on left, good effort   Runners lunge with TRX 3x 12 bilat with good carry over of demo for technique       Seated rest breaks provided between exercises for recovery b/t exercises.  PATIENT EDUCATION: Education details: therex form/technique Person educated: Patient Education method: Education officer, environmental, and Verbal cues Education comprehension: verbalized understanding, returned demonstration, and verbal cues required   HOME EXERCISE PROGRAM: Walking program  Clinical Impression: PT continued therex progression for increased activity tolerance, and strength/balance with success. Patient is able to comply with all cuing for proper technique of therex with good motivation throughout session. PT will continue progression as able.      PT Long Term Goals - 09/05/21 1545       PT LONG TERM GOAL #1   Title Patient will be independent with balance HEP to maximize safety in ADL, IADL, and fitness activity    Baseline walking only; 09/05/21 is walking for exercise    Time 4    Period Weeks    Status Achieved      PT LONG TERM GOAL #2   Title  Pt to demonstrate improvement in minibesTest >3 points to demonstrate improved posture control and reduced falls risk.    Baseline 09/05/21 20/28    Time 5    Period Weeks    Status New    Target Date 07/20/21      PT LONG TERM GOAL #3   Title Pt to perform SLS >15 sec bilat to indicate improved single limb motor control in gait.    Baseline 02/25/2019: 14sec (retest deferred to visit 2); 09/05/21 R 20sec; L 7sec    Time 6    Period Weeks    Status On-going      PT LONG TERM GOAL #4   Title Pt will improve 6 MWT >1550f to demonstrate improved cardiorespiratory fitness    Baseline 06/15/21: 1104f(gait belt, no AD);  09/05/21: 120055fgait belt, no AD)    Time 7    Period Weeks    Status On-going    Target Date 11/25/21      PT LONG TERM GOAL #5   Title Pt to demonstrate 30sec chair rise >13 to indicate improved power in BLE.    Baseline 09/05/21 9    Time 8  PeterT [name] 10/12/2021, 12:47 PM

## 2021-10-18 ENCOUNTER — Encounter: Payer: Self-pay | Admitting: Physical Therapy

## 2021-10-18 ENCOUNTER — Ambulatory Visit: Payer: BC Managed Care – PPO | Admitting: Physical Therapy

## 2021-10-18 DIAGNOSIS — Z9181 History of falling: Secondary | ICD-10-CM

## 2021-10-18 DIAGNOSIS — R262 Difficulty in walking, not elsewhere classified: Secondary | ICD-10-CM

## 2021-10-18 NOTE — Therapy (Signed)
OUTPATIENT PHYSICAL THERAPY TREATMENT NOTE   Patient Name: Michelle Ruiz MRN: [id number] DOB:1970/12/15, 51 y.o., female Today's Date: 10/19/2021  PCP: Halford Chessman PROVIDER: [employer]   PT End of Session - 10/18/21 1516     Visit Number 17    Number of Visits 28    Date for PT Re-Evaluation 11/28/21    Authorization Type [employer] COMM Pro    Authorization Time Period 06/15/21-09/07/21    Authorization - Visit Number 6    Progress Note Due on Visit 10    PT Start Time 1510    PT Stop Time 1550    PT Time Calculation (min) 40 min    Equipment Utilized During Treatment Gait belt    Activity Tolerance Patient tolerated treatment well;Patient limited by fatigue    Behavior During Therapy WFL for tasks assessed/performed              Past Medical History:  Diagnosis Date   Abnormal Pap smear of cervix    Actinic keratosis    Anxiety    Basal cell carcinoma 04/14/2008   Right upper ant. thigh. BCC with sclerosis.   Basal cell carcinoma 08/14/2008   Right lat. lower thigh. Superficial.    Basal cell carcinoma 04/29/2019   Right mid forearm. Superficial and nodular patterns. EDC.   Basal cell carcinoma 08/17/2020   Right upper antecubital. EDC.   Basal cell carcinoma 08/23/2021   Left Lower Leg, EDc   Kidney stones    MS (multiple sclerosis) ([place])    UTI (urinary tract infection)    Past Surgical History:  Procedure Laterality Date   BREAST BIOPSY Right 2010   stereotactic biopsy/ neg/ dr.byrnett   BREAST BIOPSY Left 2012   neg/dr. brynett   CESAREAN SECTION  2008   [place]   TONSILLECTOMY  2006   There are no problems to display for this patient.   REFERRING DIAG: MS  THERAPY DIAG:  Difficulty in walking, not elsewhere classified  History of falling  Rationale for Evaluation and Treatment Rehabilitation  PERTINENT HISTORY: Patient reports history of falls x3 in the past 6 months secondary to drop foot on the L LE. PMH: Multiple sclerosis; elbow  fracture of the R UE  PRECAUTIONS: fall  SUBJECTIVE: doing okay overall. Reports she is still walking at night, no falls but reports difficulty with heat, with more fatigue. Reports some increased SOB.   PAIN:  Are you having pain? No     TODAY'S TREATMENT:  There.ex:    Recumbent bike: 6 minutes total. 2 minutes L5, 1 min level 7; 73mn level 6; 125m level 8; 1 minute cool down on L4.   5# DB clean and snatch 2x 6 with good carry over of demo   10# KB swings 2x 12 with cuing for squat with good carry over  SL squat L21 total gym 2x 12/10 bilat   Mini squat with semi circle with slider 2x 12 each LE      Seated rest breaks provided between exercises for recovery b/t exercises.  10/04/21 There.ex:    Recumbent bike: 6 minutes total. 2 minutes L4, 1 min level 6; 50m69mlevel 5; 1 minute cool down on L4.    [id number] 8 hurdle negotiation per 100ft93fervision for safety  Alt heel taps to 6in (on a 3in step) x20; standing of foam  x20 with noted fatigue at end of reps    Heel walking 30ft 87fith difficulty maintaining on left,  good effort   Runners lunge with TRX 3x 12 bilat with good carry over of demo for technique       Seated rest breaks provided between exercises for recovery b/t exercises.      PATIENT EDUCATION: Education details: therex form/technique Person educated: Patient Education method: Education officer, environmental, and Verbal cues Education comprehension: verbalized understanding, returned demonstration, and verbal cues required   HOME EXERCISE PROGRAM: Walking program  Clinical Impression: PT continued therex progression for increased activity tolerance, and strength/balance with success. Patient is able to comply with all cuing for proper technique of therex with good motivation throughout session. PT will continue progression as able.      PT Long Term Goals - 09/05/21 1545       PT LONG TERM GOAL #1   Title Patient will be independent with  balance HEP to maximize safety in ADL, IADL, and fitness activity    Baseline walking only; 09/05/21 is walking for exercise    Time 4    Period Weeks    Status Achieved      PT LONG TERM GOAL #2   Title Pt to demonstrate improvement in minibesTest >3 points to demonstrate improved posture control and reduced falls risk.    Baseline 09/05/21 20/28    Time 5    Period Weeks    Status New    Target Date 07/20/21      PT LONG TERM GOAL #3   Title Pt to perform SLS >15 sec bilat to indicate improved single limb motor control in gait.    Baseline 02/25/2019: 14sec (retest deferred to visit 2); 09/05/21 R 20sec; L 7sec    Time 6    Period Weeks    Status On-going      PT LONG TERM GOAL #4   Title Pt will improve 6 MWT >1540f to demonstrate improved cardiorespiratory fitness    Baseline 06/15/21: 11022f(gait belt, no AD);  09/05/21: 120047fgait belt, no AD)    Time 7    Period Weeks    Status On-going    Target Date 11/25/21      PT LONG TERM GOAL #5   Title Pt to demonstrate 30sec chair rise >13 to indicate improved power in BLE.    Baseline 09/05/21 9    Time 8    Period Weeks    Status New              Plan - 10/19/21 1342     Clinical Impression Statement PT continued therex progression for icraesed enduranc eand strength with success. Pt with some increased fatigue d/t heat fativue. Patient is motivated throughout session, without pain, and able to complete all planned therex. Pt able to comply with all cuing for proper technique oftherex. PT will continue progression as able.    Personal Factors and Comorbidities Past/Current Experience;Fitness;Behavior Pattern    Examination-Activity Limitations Locomotion Level;Transfers;Bed Mobility;Stand;Squat    Examination-Participation Restrictions Community Activity;Yard Work    Stability/Clinical Decision Making Evolving/Moderate complexity    Clinical Decision Making Moderate    Rehab Potential Good    Clinical Impairments  Affecting Rehab Potential (-) hx of MS; (+) good motivation    PT Frequency 2x / week    PT Duration 12 weeks    PT Treatment/Interventions Iontophoresis '4mg'$ /ml Dexamethasone;Electrical Stimulation;Aquatic Therapy;Cryotherapy;Therapeutic activities;Neuromuscular re-education;Therapeutic exercise;Patient/family education;Dry needling;Manual techniques;Passive range of motion;Balance training;Gait training;Stair training;Joint Manipulations;Functional mobility training    PT Next Visit Plan strengthening, balance, endurance    PT Home Exercise  Plan walking for fitness    Consulted and Agree with Plan of Care Patient              Durwin Reges DPT Durwin Reges, PT 10/19/2021, 1:44 PM

## 2021-10-24 ENCOUNTER — Ambulatory Visit: Payer: BC Managed Care – PPO | Admitting: Physical Therapy

## 2021-10-26 ENCOUNTER — Ambulatory Visit: Payer: BC Managed Care – PPO | Attending: Neurology | Admitting: Physical Therapy

## 2021-10-26 ENCOUNTER — Encounter: Payer: Self-pay | Admitting: Physical Therapy

## 2021-10-26 DIAGNOSIS — M6281 Muscle weakness (generalized): Secondary | ICD-10-CM | POA: Insufficient documentation

## 2021-10-26 DIAGNOSIS — R262 Difficulty in walking, not elsewhere classified: Secondary | ICD-10-CM | POA: Insufficient documentation

## 2021-10-26 NOTE — Therapy (Signed)
OUTPATIENT PHYSICAL THERAPY TREATMENT NOTE   Patient Name: Michelle Ruiz MRN: [id number] DOB:Jun 23, 1970, 51 y.o., female Today's Date: 10/27/2021  PCP: Halford Chessman PROVIDER: [employer]   PT End of Session - 10/26/21 1515     Visit Number 18    Number of Visits 28    Date for PT Re-Evaluation 11/28/21    Authorization Type [employer]BS COMM Pro    Authorization Time Period 06/15/21-09/07/21    Authorization - Visit Number 8    Progress Note Due on Visit 10    PT Start Time 5681    PT Stop Time 1550    PT Time Calculation (min) 39 min    Equipment Utilized During Treatment Gait belt    Activity Tolerance Patient tolerated treatment well;Patient limited by fatigue    Behavior During Therapy WFL for tasks assessed/performed               Past Medical History:  Diagnosis Date   Abnormal Pap smear of cervix    Actinic keratosis    Anxiety    Basal cell carcinoma 04/14/2008   Right upper ant. thigh. BCC with sclerosis.   Basal cell carcinoma 08/14/2008   Right lat. lower thigh. Superficial.    Basal cell carcinoma 04/29/2019   Right mid forearm. Superficial and nodular patterns. EDC.   Basal cell carcinoma 08/17/2020   Right upper antecubital. EDC.   Basal cell carcinoma 08/23/2021   Left Lower Leg, EDc   Kidney stones    MS (multiple sclerosis) ([place])    UTI (urinary tract infection)    Past Surgical History:  Procedure Laterality Date   BREAST BIOPSY Right 2010   stereotactic biopsy/ neg/ dr.byrnett   BREAST BIOPSY Left 2012   neg/dr. brynett   CESAREAN SECTION  2008   [place]   TONSILLECTOMY  2006   There are no problems to display for this patient.   REFERRING DIAG: MS  THERAPY DIAG:  Difficulty in walking, not elsewhere classified  Muscle weakness (generalized)  Rationale for Evaluation and Treatment Rehabilitation  PERTINENT HISTORY: Patient reports history of falls x3 in the past 6 months secondary to drop foot on the L LE. PMH: Multiple  sclerosis; elbow fracture of the R UE  PRECAUTIONS: fall  SUBJECTIVE: doing well overall. No falls since last visit. Pt feels as though she is over her flare up from the past couple weeks.   PAIN:  Are you having pain? No     TODAY'S TREATMENT:  There.ex:    Recumbent bike: 6 minutes total. 2 minutes L5, 1 min level 7; 28mn level 6; 142m level 8; 1 minute cool down on L4.   5# DB clean and snatch 2x 8 with good carry over of demo   [place]# 2x 5 with  good carry of demo   Alt lateral lunge 2x 10 with 6# DB outstretched for increased core demand with good carry over   Mini squat with abd with slider x12 each LE with good carry over of cuing     Seated rest breaks provided between exercises for recovery b/t exercises     PATIENT EDUCATION: Education details: therex form/technique Person educated: Patient Education method: Explanation, Demonstration, and Verbal cues Education comprehension: verbalized understanding, returned demonstration, and verbal cues required   HOME EXERCISE PROGRAM: Walking program  Clinical Impression: PT continued therex progression for increased activity tolerance, and strength/balance with success. Patient is able to comply with all cuing for proper technique of  therex with good motivation throughout session. PT will continue progression as able.   Clinical Impression: PT continued therex progression for balance and BLE/core strength and stability with success. Patient is motivated throughout session and able to comply with all cuing for proper technique of therex. Patient with less fatigue tthan in previous sessions, seeming to be coming down from previous flare up, with normal fatigue from MS this session. PT will continue progression as able.      PT Long Term Goals - 09/05/21 1545       PT LONG TERM GOAL #1   Title Patient will be independent with balance HEP to maximize safety in ADL, IADL, and fitness activity    Baseline walking  only; 09/05/21 is walking for exercise    Time 4    Period Weeks    Status Achieved      PT LONG TERM GOAL #2   Title Pt to demonstrate improvement in minibesTest >3 points to demonstrate improved posture control and reduced falls risk.    Baseline 09/05/21 20/28    Time 5    Period Weeks    Status New    Target Date 07/20/21      PT LONG TERM GOAL #3   Title Pt to perform SLS >15 sec bilat to indicate improved single limb motor control in gait.    Baseline 02/25/2019: 14sec (retest deferred to visit 2); 09/05/21 R 20sec; L 7sec    Time 6    Period Weeks    Status On-going      PT LONG TERM GOAL #4   Title Pt will improve 6 MWT >1571f to demonstrate improved cardiorespiratory fitness    Baseline 06/15/21: 11028f(gait belt, no AD);  09/05/21: 120080fgait belt, no AD)    Time 7    Period Weeks    Status On-going    Target Date 11/25/21      PT LONG TERM GOAL #5   Title Pt to demonstrate 30sec chair rise >13 to indicate improved power in BLE.    Baseline 09/05/21 9    Time 8  Twin BrooksT [name] 10/27/2021, 10:22 AM

## 2021-11-01 ENCOUNTER — Ambulatory Visit: Payer: BC Managed Care – PPO | Admitting: Physical Therapy

## 2021-11-01 ENCOUNTER — Encounter: Payer: Self-pay | Admitting: Physical Therapy

## 2021-11-01 DIAGNOSIS — R262 Difficulty in walking, not elsewhere classified: Secondary | ICD-10-CM

## 2021-11-01 NOTE — Therapy (Signed)
OUTPATIENT PHYSICAL THERAPY TREATMENT NOTE   Patient Name: Michelle Ruiz MRN: [id number] DOB:22-Sep-1970, 51 y.o., female Today's Date: 11/01/2021  PCP: Halford Chessman PROVIDER: [employer]   PT End of Session - 11/01/21 1528     Visit Number 19    Number of Visits 28    Date for PT Re-Evaluation 11/28/21    Authorization Type [employer]BS COMM Pro    Authorization Time Period 06/15/21-09/07/21    Authorization - Visit Number 9    Progress Note Due on Visit 10    PT Start Time 4401    PT Stop Time 1551    PT Time Calculation (min) 39 min    Equipment Utilized During Treatment Gait belt    Activity Tolerance Patient tolerated treatment well;Patient limited by fatigue    Behavior During Therapy WFL for tasks assessed/performed                Past Medical History:  Diagnosis Date   Abnormal Pap smear of cervix    Actinic keratosis    Anxiety    Basal cell carcinoma 04/14/2008   Right upper ant. thigh. BCC with sclerosis.   Basal cell carcinoma 08/14/2008   Right lat. lower thigh. Superficial.    Basal cell carcinoma 04/29/2019   Right mid forearm. Superficial and nodular patterns. EDC.   Basal cell carcinoma 08/17/2020   Right upper antecubital. EDC.   Basal cell carcinoma 08/23/2021   Left Lower Leg, EDc   Kidney stones    MS (multiple sclerosis) ([place])    UTI (urinary tract infection)    Past Surgical History:  Procedure Laterality Date   BREAST BIOPSY Right 2010   stereotactic biopsy/ neg/ dr.byrnett   BREAST BIOPSY Left 2012   neg/dr. brynett   CESAREAN SECTION  2008   [place]   TONSILLECTOMY  2006   There are no problems to display for this patient.   REFERRING DIAG: MS  THERAPY DIAG:  Difficulty in walking, not elsewhere classified  Rationale for Evaluation and Treatment Rehabilitation  PERTINENT HISTORY: Patient reports history of falls x3 in the past 6 months secondary to drop foot on the L LE. PMH: Multiple sclerosis; elbow fracture of the R  UE  PRECAUTIONS: fall  SUBJECTIVE: doing well overall. No falls since last visit. Pt reports she has not been walking with brace but has had no stumbles without. Has been biking 3-4x/week 20-25mns and she has been doing well with it.   PAIN:  Are you having pain? No     TODAY'S TREATMENT:  There.ex:    Recumbent bike: 6 minutes total. 2 minutes L5, 1 min level 7; 128m level 6; 36m5mlevel 8; 1 minute cool down on L4.   Standard lunge 2x 10 bilat without UE, near rail with occassional touch for balance  Farmers carry 10# KB suspended by grey tband 2X 100f11flat with good carry over of demo for core activation, min cuing needed throughout; CGA for safety  Ball slam 15# 2x 6 with  good carry of demo   Alt lateral lunge 2x 12 with 6# DB outstretched for increased core demand with good carry over        Seated rest breaks provided between exercises for recovery b/t exercises     PATIENT EDUCATION: Education details: therex form/technique Person educated: Patient Education method: Explanation, Demonstration, and Verbal cues Education comprehension: verbalized understanding, returned demonstration, and verbal cues required   HOME EXERCISE PROGRAM: Walking program  Clinical Impression:  PT continued therex progression for increased activity tolerance, and strength/balance with success. Patient is able to comply with all cuing for proper technique of therex with good motivation throughout session. PT will continue progression as able.   Clinical Impression: PT continued therex progression for balance and BLE/core strength and stability with success. Patient is motivated throughout session and able to comply with all cuing for proper technique of therex. Pt with no increased pain throughout session. Guarding needed for safety of dynamic balance tasks. PT will continue progression as able.      PT Long Term Goals - 09/05/21 1545       PT LONG TERM GOAL #1   Title Patient  will be independent with balance HEP to maximize safety in ADL, IADL, and fitness activity    Baseline walking only; 09/05/21 is walking for exercise    Time 4    Period Weeks    Status Achieved      PT LONG TERM GOAL #2   Title Pt to demonstrate improvement in minibesTest >3 points to demonstrate improved posture control and reduced falls risk.    Baseline 09/05/21 20/28    Time 5    Period Weeks    Status New    Target Date 07/20/21      PT LONG TERM GOAL #3   Title Pt to perform SLS >15 sec bilat to indicate improved single limb motor control in gait.    Baseline 02/25/2019: 14sec (retest deferred to visit 2); 09/05/21 R 20sec; L 7sec    Time 6    Period Weeks    Status On-going      PT LONG TERM GOAL #4   Title Pt will improve 6 MWT >155f to demonstrate improved cardiorespiratory fitness    Baseline 06/15/21: 11043f(gait belt, no AD);  09/05/21: 120043fgait belt, no AD)    Time 7    Period Weeks    Status On-going    Target Date 11/25/21      PT LONG TERM GOAL #5   Title Pt to demonstrate 30sec chair rise >13 to indicate improved power in BLE.    Baseline 09/05/21 9    Time 8  ChapinT [name] 11/01/2021, 3:55 PM

## 2021-11-03 ENCOUNTER — Encounter: Payer: BC Managed Care – PPO | Admitting: Physical Therapy

## 2021-11-10 ENCOUNTER — Ambulatory Visit: Payer: BC Managed Care – PPO | Admitting: Physical Therapy

## 2021-11-10 DIAGNOSIS — M6281 Muscle weakness (generalized): Secondary | ICD-10-CM

## 2021-11-10 DIAGNOSIS — R262 Difficulty in walking, not elsewhere classified: Secondary | ICD-10-CM | POA: Diagnosis not present

## 2021-11-10 NOTE — Therapy (Signed)
OUTPATIENT PHYSICAL THERAPY TREATMENT NOTE   Patient Name: Michelle Ruiz MRN: [id number] DOB:08-17-1970, 51 y.o., female Today's Date: 11/01/2021  PCP: Halford Chessman PROVIDER: [employer]   PT End of Session - 11/01/21 1528     Visit Number 19    Number of Visits 28    Date for PT Re-Evaluation 11/28/21    Authorization Type [employer] COMM Pro    Authorization Time Period 06/15/21-09/07/21    Authorization - Visit Number 9    Progress Note Due on Visit 10    PT Start Time 9323    PT Stop Time 1551    PT Time Calculation (min) 39 min    Equipment Utilized During Treatment Gait belt    Activity Tolerance Patient tolerated treatment well;Patient limited by fatigue    Behavior During Therapy WFL for tasks assessed/performed                Past Medical History:  Diagnosis Date   Abnormal Pap smear of cervix    Actinic keratosis    Anxiety    Basal cell carcinoma 04/14/2008   Right upper ant. thigh. BCC with sclerosis.   Basal cell carcinoma 08/14/2008   Right lat. lower thigh. Superficial.    Basal cell carcinoma 04/29/2019   Right mid forearm. Superficial and nodular patterns. EDC.   Basal cell carcinoma 08/17/2020   Right upper antecubital. EDC.   Basal cell carcinoma 08/23/2021   Left Lower Leg, EDc   Kidney stones    MS (multiple sclerosis) ([place])    UTI (urinary tract infection)    Past Surgical History:  Procedure Laterality Date   BREAST BIOPSY Right 2010   stereotactic biopsy/ neg/ dr.byrnett   BREAST BIOPSY Left 2012   neg/dr. brynett   CESAREAN SECTION  2008   [place]   TONSILLECTOMY  2006   There are no problems to display for this patient.   REFERRING DIAG: MS  THERAPY DIAG:  Difficulty in walking, not elsewhere classified  Rationale for Evaluation and Treatment Rehabilitation  PERTINENT HISTORY: Patient reports history of falls x3 in the past 6 months secondary to drop foot on the L LE. PMH: Multiple sclerosis; elbow fracture of the R  UE  PRECAUTIONS: fall  SUBJECTIVE: Pt has had no falls since last visit, has not worn brace, but is feeling steady without it. Has been having calf cramps during the night over the past couple days. Is having some L periscapular pain that has come on insidiously.   PAIN:  Are you having pain? No     TODAY'S TREATMENT:  There.ex:    Recumbent bike: 6 minutes total. 2 minutes L5, 1 min level 7; 39mn level 6; 127m level 8; 1 minute cool down on L4.   TRX pull up 2x 10 with good carry over following demo  [place] 2x 10 with good carry over of demo; min cuing for core activation  Farmers carry 10# KB suspended by grey tband 2X 10049filat with good carry over of demo for core activation, min cuing needed throughout; CGA for safety  Alt lateral lunge x12 with 10# DB overhead for increased core demand with good carry over        Seated rest breaks provided between exercises for recovery b/t exercises     PATIENT EDUCATION: Education details: therex form/technique Person educated: Patient Education method: Explanation, Demonstration, and Verbal cues Education comprehension: verbalized understanding, returned demonstration, and verbal cues required   HOME EXERCISE PROGRAM: Walking  program  Clinical Impression: PT continued therex progression for increased activity tolerance, and strength/balance with success. Patient is able to comply with all cuing for proper technique of therex with good motivation throughout session. PT will continue progression as able.   Clinical Impression: PT continued therex progression for periscapular nd BLE/core strength and stability with success. Patient is motivated throughout session and able to comply with all cuing for proper technique of therex. Pt with no increased pain throughout session. Guarding needed for safety of dynamic balance tasks. PT will continue progression as able.      PT Long Term Goals - 09/05/21 1545       PT LONG TERM  GOAL #1   Title Patient will be independent with balance HEP to maximize safety in ADL, IADL, and fitness activity    Baseline walking only; 09/05/21 is walking for exercise    Time 4    Period Weeks    Status Achieved      PT LONG TERM GOAL #2   Title Pt to demonstrate improvement in minibesTest >3 points to demonstrate improved posture control and reduced falls risk.    Baseline 09/05/21 20/28    Time 5    Period Weeks    Status New    Target Date 07/20/21      PT LONG TERM GOAL #3   Title Pt to perform SLS >15 sec bilat to indicate improved single limb motor control in gait.    Baseline 02/25/2019: 14sec (retest deferred to visit 2); 09/05/21 R 20sec; L 7sec    Time 6    Period Weeks    Status On-going      PT LONG TERM GOAL #4   Title Pt will improve 6 MWT >1523f to demonstrate improved cardiorespiratory fitness    Baseline 06/15/21: 11023f(gait belt, no AD);  09/05/21: 120016fgait belt, no AD)    Time 7    Period Weeks    Status On-going    Target Date 11/25/21      PT LONG TERM GOAL #5   Title Pt to demonstrate 30sec chair rise >13 to indicate improved power in BLE.    Baseline 09/05/21 9    Time 8  Eagle NestT [name] 11/01/2021, 3:55 PM

## 2021-11-16 ENCOUNTER — Ambulatory Visit: Payer: BC Managed Care – PPO | Admitting: Physical Therapy

## 2021-11-16 DIAGNOSIS — R262 Difficulty in walking, not elsewhere classified: Secondary | ICD-10-CM

## 2021-11-16 DIAGNOSIS — M6281 Muscle weakness (generalized): Secondary | ICD-10-CM

## 2021-11-16 NOTE — Therapy (Signed)
OUTPATIENT PHYSICAL THERAPY TREATMENT NOTE   Patient Name: Michelle Ruiz MRN: [id number] DOB:02-11-1971, 51 y.o., female Today's Date: 11/01/2021  PCP: Halford Chessman PROVIDER: [employer]   PT End of Session - 11/01/21 1528     Visit Number 19    Number of Visits 28    Date for PT Re-Evaluation 11/28/21    Authorization Type BCBS COMM Pro    Authorization Time Period 06/15/21-09/07/21    Authorization - Visit Number 9    Progress Note Due on Visit 10    PT Start Time 4270    PT Stop Time 1551    PT Time Calculation (min) 39 min    Equipment Utilized During Treatment Gait belt    Activity Tolerance Patient tolerated treatment well;Patient limited by fatigue    Behavior During Therapy WFL for tasks assessed/performed                Past Medical History:  Diagnosis Date   Abnormal Pap smear of cervix    Actinic keratosis    Anxiety    Basal cell carcinoma 04/14/2008   Right upper ant. thigh. BCC with sclerosis.   Basal cell carcinoma 08/14/2008   Right lat. lower thigh. Superficial.    Basal cell carcinoma 04/29/2019   Right mid forearm. Superficial and nodular patterns. EDC.   Basal cell carcinoma 08/17/2020   Right upper antecubital. EDC.   Basal cell carcinoma 08/23/2021   Left Lower Leg, EDc   Kidney stones    MS (multiple sclerosis) ([place])    UTI (urinary tract infection)    Past Surgical History:  Procedure Laterality Date   BREAST BIOPSY Right 2010   stereotactic biopsy/ neg/ dr.byrnett   BREAST BIOPSY Left 2012   neg/dr. brynett   CESAREAN SECTION  2008   [place]   TONSILLECTOMY  2006   There are no problems to display for this patient.   REFERRING DIAG: MS  THERAPY DIAG:  Difficulty in walking, not elsewhere classified  Rationale for Evaluation and Treatment Rehabilitation  PERTINENT HISTORY: Patient reports history of falls x3 in the past 6 months secondary to drop foot on the L LE. PMH: Multiple sclerosis; elbow fracture of the R  UE  PRECAUTIONS: fall  SUBJECTIVE: Pt reports she has been having trouble with her Ues and ribcage giving her pain. She also [place] a lot yesterday in [employer] in [place] and reports this has given her bilat hip pain. Sternum pain without heart palpations or SOB, with associated thoracic pain.  PAIN:  Are you having pain? No     TODAY'S TREATMENT:  There.ex:    Recumbent bike L4 35mn  Cat/Cow x12 with good carry over of initial demo cuing  Thoracic ext over towel with pink tball BUE overhead x12 with cuing for breath   126/74   Thoracic rotation with overhead pink ball x12 with cuing needed for technique  Thread the needle with theraball seated x12 bilat  Figure 4 stretch 30sec hold  Childs pose 30sec Doorway pec stretch 30sec        Seated rest breaks provided between exercises for recovery b/t exercises     PATIENT EDUCATION: Education details: therex form/technique Person educated: Patient Education method: EConsulting civil engineer Demonstration, and Verbal cues Education comprehension: verbalized understanding, returned demonstration, and verbal cues required   HOME EXERCISE PROGRAM: Walking program  Clinical Impression: PT continued therex progression for increased activity tolerance, and strength/balance with success. Patient is able to comply with all cuing for  proper technique of therex with good motivation throughout session. PT will continue progression as able.   Clinical Impression: PT utilized therex for increasing mobility and pain reduction this session with success. Patient is able to comply with all cuing for proper technique of therex with most cuing needed for breath control. Patietn with no pain following session. PT will continue progression as able.      PT Long Term Goals - 09/05/21 1545       PT LONG TERM GOAL #1   Title Patient will be independent with balance HEP to maximize safety in ADL, IADL, and fitness activity    Baseline walking only;  09/05/21 is walking for exercise    Time 4    Period Weeks    Status Achieved      PT LONG TERM GOAL #2   Title Pt to demonstrate improvement in minibesTest >3 points to demonstrate improved posture control and reduced falls risk.    Baseline 09/05/21 20/28    Time 5    Period Weeks    Status New    [employer] Date 07/20/21      PT LONG TERM GOAL #3   Title Pt to perform SLS >15 sec bilat to indicate improved single limb motor control in gait.    Baseline 02/25/2019: 14sec (retest deferred to visit 2); 09/05/21 R 20sec; L 7sec    Time 6    Period Weeks    Status On-going      PT LONG TERM GOAL #4   Title Pt will improve 6 MWT >1555f to demonstrate improved cardiorespiratory fitness    Baseline 06/15/21: 1101f(gait belt, no AD);  09/05/21: 120066fgait belt, no AD)    Time 7    Period Weeks    Status On-going    [place] Date 11/25/21      PT LONG TERM GOAL #5   Title Pt to demonstrate 30sec chair rise >13 to indicate improved power in BLE.    Baseline 09/05/21 9    Time 8  [place] [name] 11/01/2021, 3:55 PM

## 2021-11-24 ENCOUNTER — Ambulatory Visit: Payer: BC Managed Care – PPO | Admitting: Physical Therapy

## 2021-11-24 ENCOUNTER — Encounter: Payer: Self-pay | Admitting: Physical Therapy

## 2021-11-24 DIAGNOSIS — R262 Difficulty in walking, not elsewhere classified: Secondary | ICD-10-CM | POA: Diagnosis not present

## 2021-11-24 DIAGNOSIS — M6281 Muscle weakness (generalized): Secondary | ICD-10-CM

## 2021-11-24 NOTE — Therapy (Signed)
OUTPATIENT PHYSICAL THERAPY TREATMENT NOTE   Patient Name: Michelle Ruiz MRN: [id number] DOB:1970/05/08, 51 y.o., female Today's Date: 11/01/2021  PCP: Halford Chessman PROVIDER: [employer]   PT End of Session - 11/01/21 1528     Visit Number 19    Number of Visits 28    Date for PT Re-Evaluation 11/28/21    Authorization Type BCBS COMM Pro    Authorization Time Period 06/15/21-09/07/21    Authorization - Visit Number 9    Progress Note Due on Visit 10    PT Start Time 4132    PT Stop Time 1551    PT Time Calculation (min) 39 min    Equipment Utilized During Treatment Gait belt    Activity Tolerance Patient tolerated treatment well;Patient limited by fatigue    Behavior During Therapy WFL for tasks assessed/performed                Past Medical History:  Diagnosis Date   Abnormal Pap smear of cervix    Actinic keratosis    Anxiety    Basal cell carcinoma 04/14/2008   Right upper ant. thigh. BCC with sclerosis.   Basal cell carcinoma 08/14/2008   Right lat. lower thigh. Superficial.    Basal cell carcinoma 04/29/2019   Right mid forearm. Superficial and nodular patterns. EDC.   Basal cell carcinoma 08/17/2020   Right upper antecubital. EDC.   Basal cell carcinoma 08/23/2021   Left Lower Leg, EDc   Kidney stones    MS (multiple sclerosis) ([place])    UTI (urinary tract infection)    Past Surgical History:  Procedure Laterality Date   BREAST BIOPSY Right 2010   stereotactic biopsy/ neg/ dr.byrnett   BREAST BIOPSY Left 2012   neg/dr. brynett   CESAREAN SECTION  2008   [place]   TONSILLECTOMY  2006   There are no problems to display for this patient.   REFERRING DIAG: MS  THERAPY DIAG:  Difficulty in walking, not elsewhere classified  Rationale for Evaluation and Treatment Rehabilitation  PERTINENT HISTORY: Patient reports history of falls x3 in the past 6 months secondary to drop foot on the L LE. PMH: Multiple sclerosis; elbow fracture of the R  UE  PRECAUTIONS: fall  SUBJECTIVE: Pt has had no falls since last visit. Feeling much better than last week  PAIN:  Are you having pain? No     TODAY'S TREATMENT:  There.ex:    Recumbent bike: 6.5 minutes total. 2 minutes L5, 1 min level 7; 22mn level 6; 138m level 8; 1.5 minute cool down on L4.   Wt'd ball slam 3x 8 15# with cuing for technique   Farmers carry 10# KB suspended by grey tband 2X 10083filat with good carry over of demo for core activation, min cuing needed throughout; CGA for safety  Alt lateral lunge x12 with 10# DB increased core demand with good carry over   Around the world 10# KB SLS with CLLE toe touch x12 each direction with cuing for core activaiton      Seated rest breaks provided between exercises for recovery b/t exercises     PATIENT EDUCATION: Education details: therex form/technique Person educated: Patient Education method: ExpConsulting civil engineeremonstration, and Verbal cues Education comprehension: verbalized understanding, returned demonstration, and verbal cues required   HOME EXERCISE PROGRAM: Walking program  Clinical Impression: PT continued therex progression for increased activity tolerance, and strength/balance with success. Patient is able to comply with all cuing for proper technique of  therex with good motivation throughout session. Guarding for safety with dynamic balance therex.  PT will continue progression as able.        PT Long Term Goals - 09/05/21 1545       PT LONG TERM GOAL #1   Title Patient will be independent with balance HEP to maximize safety in ADL, IADL, and fitness activity    Baseline walking only; 09/05/21 is walking for exercise    Time 4    Period Weeks    Status Achieved      PT LONG TERM GOAL #2   Title Pt to demonstrate improvement in minibesTest >3 points to demonstrate improved posture control and reduced falls risk.    Baseline 09/05/21 20/28    Time 5    Period Weeks    Status New    [place]  Date 07/20/21      PT LONG TERM GOAL #3   Title Pt to perform SLS >15 sec bilat to indicate improved single limb motor control in gait.    Baseline 02/25/2019: 14sec (retest deferred to visit 2); 09/05/21 R 20sec; L 7sec    Time 6    Period Weeks    Status On-going      PT LONG TERM GOAL #4   Title Pt will improve 6 MWT >1541f to demonstrate improved cardiorespiratory fitness    Baseline 06/15/21: 11048f(gait belt, no AD);  09/05/21: 12002fgait belt, no AD)    Time 7    Period Weeks    Status On-going    [place] Date 11/25/21      PT LONG TERM GOAL #5   Title Pt to demonstrate 30sec chair rise >13 to indicate improved power in BLE.    Baseline 09/05/21 9    Time 8  [place] [name] 11/01/2021, 3:55 PM

## 2021-11-29 ENCOUNTER — Ambulatory Visit: Payer: BC Managed Care – PPO | Attending: Neurology | Admitting: Physical Therapy

## 2021-11-29 DIAGNOSIS — M6281 Muscle weakness (generalized): Secondary | ICD-10-CM | POA: Diagnosis present

## 2021-11-29 DIAGNOSIS — R262 Difficulty in walking, not elsewhere classified: Secondary | ICD-10-CM | POA: Diagnosis present

## 2021-11-29 NOTE — Therapy (Signed)
OUTPATIENT PHYSICAL THERAPY TREATMENT NOTE/Progress Note Reporting Period 09/05/21 - 11/29/21    Patient Name: Michelle Ruiz MRN: [id number] DOB:December 10, 1970, 51 y.o., female Today's Date: 11/01/2021  PCP: Halford Chessman PROVIDER: [employer]   PT End of Session - 11/01/21 1528     Visit Number 19    Number of Visits 28    Date for PT Re-Evaluation 11/28/21    Authorization Type BCBS COMM Pro    Authorization Time Period 06/15/21-09/07/21    Authorization - Visit Number 9    Progress Note Due on Visit 10    PT Start Time 6834    PT Stop Time 1551    PT Time Calculation (min) 39 min    Equipment Utilized During Treatment Gait belt    Activity Tolerance Patient tolerated treatment well;Patient limited by fatigue    Behavior During Therapy WFL for tasks assessed/performed                Past Medical History:  Diagnosis Date   Abnormal Pap smear of cervix    Actinic keratosis    Anxiety    Basal cell carcinoma 04/14/2008   Right upper ant. thigh. BCC with sclerosis.   Basal cell carcinoma 08/14/2008   Right lat. lower thigh. Superficial.    Basal cell carcinoma 04/29/2019   Right mid forearm. Superficial and nodular patterns. EDC.   Basal cell carcinoma 08/17/2020   Right upper antecubital. EDC.   Basal cell carcinoma 08/23/2021   Left Lower Leg, EDc   Kidney stones    MS (multiple sclerosis) ([place])    UTI (urinary tract infection)    Past Surgical History:  Procedure Laterality Date   BREAST BIOPSY Right 2010   stereotactic biopsy/ neg/ dr.byrnett   BREAST BIOPSY Left 2012   neg/dr. brynett   CESAREAN SECTION  2008   [place]   TONSILLECTOMY  2006   There are no problems to display for this patient.   REFERRING DIAG: MS  THERAPY DIAG:  Difficulty in walking, not elsewhere classified  Rationale for Evaluation and Treatment Rehabilitation  PERTINENT HISTORY: Patient reports history of falls x3 in the past 6 months secondary to drop foot on the L LE.  PMH: Multiple sclerosis; elbow fracture of the R UE  PRECAUTIONS: fall  SUBJECTIVE: Pt is doing well overall. Reports her legs felt heavy last night going upstairs to go to bed. Reports doing well with walking no falls  PAIN:  Are you having pain? No     TODAY'S TREATMENT:  There.ex:    Recumbent bike: 6.5 minutes total. 2 minutes L5, 1 min level 7; 40mn level 6; 140m level 8; 1.5 minute cool down on L4.   30sec STS 10.5 SLS R/L Trial 1 20/5 Trial 2 22/8  MiniBest test with supervision throughout- minA for occasional reactive balance    Wt/d ball (4.4#/2kg) toss/catch at reLeggett & Platt on foam with narrow BOS x12; on foam semi tandem x6 each      Seated rest breaks provided between exercises for recovery b/t exercises     PATIENT EDUCATION: Education details: therex form/technique Person educated: Patient Education method: ExConsulting civil engineerDemonstration, and Verbal cues Education comprehension: verbalized understanding, returned demonstration, and verbal cues required   HOME EXERCISE PROGRAM: Walking program  Clinical Impression: PT reassessed goals this session where patient has shown progress toward all goals. Pt with remaining deficits in reactive balance, endurance, and static balance. Patient is able to comply with all cuing for proper technique  of therex and safety with guarding needed for balance tests. Would benefit from skilled PT to address above deficits and promote optimal return to PLOF.         PT Long Term Goals - 09/05/21 1545       PT LONG TERM GOAL #1   Title Patient will be independent with balance HEP to maximize safety in ADL, IADL, and fitness activity    Baseline walking only; 09/05/21 is walking for exercise    Time 4    Period Weeks    Status Achieved      PT LONG TERM GOAL #2   Title Pt to demonstrate improvement in minibesTest >3 points to demonstrate improved posture control and reduced falls risk.    Baseline 09/05/21 20/28     Time 5    Period Weeks    Status New    [place] Date 07/20/21      PT LONG TERM GOAL #3   Title Pt to perform SLS >15 sec bilat to indicate improved single limb motor control in gait.    Baseline 02/25/2019: 14sec (retest deferred to visit 2); 09/05/21 R 20sec; L 7sec    Time 6    Period Weeks    Status On-going      PT LONG TERM GOAL #4   Title Pt will improve 6 MWT >1547f to demonstrate improved cardiorespiratory fitness    Baseline 06/15/21: 11050f(gait belt, no AD);  09/05/21: 120030fgait belt, no AD)    Time 7    Period Weeks    Status On-going    [place] Date 11/25/21      PT LONG TERM GOAL #5   Title Pt to demonstrate 30sec chair rise >13 to indicate improved power in BLE.    Baseline 09/05/21 9    Time 8  [place] [name] 11/01/2021, 3:55 PM

## 2021-12-05 ENCOUNTER — Ambulatory Visit: Payer: BC Managed Care – PPO | Admitting: Physical Therapy

## 2021-12-05 ENCOUNTER — Encounter: Payer: Self-pay | Admitting: Physical Therapy

## 2021-12-05 DIAGNOSIS — M6281 Muscle weakness (generalized): Secondary | ICD-10-CM | POA: Diagnosis not present

## 2021-12-05 DIAGNOSIS — R262 Difficulty in walking, not elsewhere classified: Secondary | ICD-10-CM

## 2021-12-05 NOTE — Therapy (Signed)
OUTPATIENT PHYSICAL THERAPY TREATMENT NOTE/Progress Note Reporting Period 09/05/21 - 11/29/21    Patient Name: Michelle Ruiz MRN: [id number] DOB:06/05/1970, 51 y.o., female Today's Date: 11/01/2021  PCP: Halford Chessman PROVIDER: [employer]   PT End of Session - 11/01/21 1528     Visit Number 19    Number of Visits 28    Date for PT Re-Evaluation 11/28/21    Authorization Type BCBS COMM Pro    Authorization Time Period 06/15/21-09/07/21    Authorization - Visit Number 9    Progress Note Due on Visit 10    PT Start Time 1017    PT Stop Time 1551    PT Time Calculation (min) 39 min    Equipment Utilized During Treatment Gait belt    Activity Tolerance Patient tolerated treatment well;Patient limited by fatigue    Behavior During Therapy WFL for tasks assessed/performed                Past Medical History:  Diagnosis Date   Abnormal Pap smear of cervix    Actinic keratosis    Anxiety    Basal cell carcinoma 04/14/2008   Right upper ant. thigh. BCC with sclerosis.   Basal cell carcinoma 08/14/2008   Right lat. lower thigh. Superficial.    Basal cell carcinoma 04/29/2019   Right mid forearm. Superficial and nodular patterns. EDC.   Basal cell carcinoma 08/17/2020   Right upper antecubital. EDC.   Basal cell carcinoma 08/23/2021   Left Lower Leg, EDc   Kidney stones    MS (multiple sclerosis) ([place])    UTI (urinary tract infection)    Past Surgical History:  Procedure Laterality Date   BREAST BIOPSY Right 2010   stereotactic biopsy/ neg/ dr.byrnett   BREAST BIOPSY Left 2012   neg/dr. brynett   CESAREAN SECTION  2008   [place]   TONSILLECTOMY  2006   There are no problems to display for this patient.   REFERRING DIAG: MS  THERAPY DIAG:  Difficulty in walking, not elsewhere classified  Rationale for Evaluation and Treatment Rehabilitation  PERTINENT HISTORY: Patient reports history of falls x3 in the past 6 months secondary to drop foot on the L LE.  PMH: Multiple sclerosis; elbow fracture of the R UE  PRECAUTIONS: fall  SUBJECTIVE: Pt is doing well overall. Reports her legs felt heavy last night going upstairs to go to bed. Reports doing well with walking no falls  PAIN:  Are you having pain? No     TODAY'S TREATMENT:  There.ex:    Recumbent bike: 6.5 minutes total. 2 minutes L5, 1 min level 7; 45mn level 6; 191m level 8; 1.5 minute cool down on L4.   Wood chop 10# 2x 6 bilat with focus on weight shift with good cary over of demo- more difficulty with L wt shift than R  Standing on bosu (hardside): Alt Side to side shift x20  Alt wt shift to toes x20 Squat x12 x2 rounds   Wt/d ball (4.4#/2kg) toss/catch at rebounder  on foam semi tandem x12 each      Seated rest breaks provided between exercises for recovery b/t exercises     PATIENT EDUCATION: Education details: therex form/technique Person educated: Patient Education method: ExConsulting civil engineerDemonstration, and Verbal cues Education comprehension: verbalized understanding, returned demonstration, and verbal cues required   HOME EXERCISE PROGRAM: Walking program  Clinical Impression: PT continued therex progression with reactionary balance and BLE and core strengthening with success. PT challenged upright endurance  as well with good response. Pt is able to comply with all cuing for proper technique of therex with guarding for safety. PT will continue progresson as able.          PT Long Term Goals - 09/05/21 1545       PT LONG TERM GOAL #1   Title Patient will be independent with balance HEP to maximize safety in ADL, IADL, and fitness activity    Baseline walking only; 09/05/21 is walking for exercise    Time 4    Period Weeks    Status Achieved      PT LONG TERM GOAL #2   Title Pt to demonstrate improvement in minibesTest >3 points to demonstrate improved posture control and reduced falls risk.    Baseline 09/05/21 20/28    Time 5    Period Weeks     Status New    [place] Date 07/20/21      PT LONG TERM GOAL #3   Title Pt to perform SLS >15 sec bilat to indicate improved single limb motor control in gait.    Baseline 02/25/2019: 14sec (retest deferred to visit 2); 09/05/21 R 20sec; L 7sec    Time 6    Period Weeks    Status On-going      PT LONG TERM GOAL #4   Title Pt will improve 6 MWT >1525f to demonstrate improved cardiorespiratory fitness    Baseline 06/15/21: 11074f(gait belt, no AD);  09/05/21: 120061fgait belt, no AD)    Time 7    Period Weeks    Status On-going    [place] Date 11/25/21      PT LONG TERM GOAL #5   Title Pt to demonstrate 30sec chair rise >13 to indicate improved power in BLE.    Baseline 09/05/21 9    Time 8  [place] [name] 11/01/2021, 3:55 PM

## 2021-12-12 ENCOUNTER — Encounter: Payer: Self-pay | Admitting: Physical Therapy

## 2021-12-12 ENCOUNTER — Ambulatory Visit: Payer: BC Managed Care – PPO | Admitting: Physical Therapy

## 2021-12-12 DIAGNOSIS — M6281 Muscle weakness (generalized): Secondary | ICD-10-CM

## 2021-12-12 DIAGNOSIS — R262 Difficulty in walking, not elsewhere classified: Secondary | ICD-10-CM

## 2021-12-12 NOTE — Therapy (Signed)
OUTPATIENT PHYSICAL THERAPY TREATMENT NOTE/Progress Note Reporting Period 09/05/21 - 11/29/21    Patient Name: Michelle Ruiz MRN: [id number] DOB:04-Feb-1971, 51 y.o., female Today's Date: 11/01/2021  PCP: Halford Chessman PROVIDER: [employer]   PT End of Session - 11/01/21 1528     Visit Number 19    Number of Visits 28    Date for PT Re-Evaluation 11/28/21    Authorization Type BCBS COMM Pro    Authorization Time Period 06/15/21-09/07/21    Authorization - Visit Number 9    Progress Note Due on Visit 10    PT Start Time 0165    PT Stop Time 1551    PT Time Calculation (min) 39 min    Equipment Utilized During Treatment Gait belt    Activity Tolerance Patient tolerated treatment well;Patient limited by fatigue    Behavior During Therapy WFL for tasks assessed/performed                Past Medical History:  Diagnosis Date   Abnormal Pap smear of cervix    Actinic keratosis    Anxiety    Basal cell carcinoma 04/14/2008   Right upper ant. thigh. BCC with sclerosis.   Basal cell carcinoma 08/14/2008   Right lat. lower thigh. Superficial.    Basal cell carcinoma 04/29/2019   Right mid forearm. Superficial and nodular patterns. EDC.   Basal cell carcinoma 08/17/2020   Right upper antecubital. EDC.   Basal cell carcinoma 08/23/2021   Left Lower Leg, EDc   Kidney stones    MS (multiple sclerosis) ([place])    UTI (urinary tract infection)    Past Surgical History:  Procedure Laterality Date   BREAST BIOPSY Right 2010   stereotactic biopsy/ neg/ dr.byrnett   BREAST BIOPSY Left 2012   neg/dr. brynett   CESAREAN SECTION  2008   [place]   TONSILLECTOMY  2006   There are no problems to display for this patient.   REFERRING DIAG: MS  THERAPY DIAG:  Difficulty in walking, not elsewhere classified  Rationale for Evaluation and Treatment Rehabilitation  PERTINENT HISTORY: Patient reports history of falls x3 in the past 6 months secondary to drop foot on the L LE.  PMH: Multiple sclerosis; elbow fracture of the R UE  PRECAUTIONS: fall  SUBJECTIVE: Pt is doing well overall. Reports her legs felt heavy last night going upstairs to go to bed. Reports doing well with walking no falls  PAIN:  Are you having pain? No     TODAY'S TREATMENT:  There.ex:    Recumbent bike: 6.5 minutes total. 2 minutes L5, 1 min level 7; 49mn level 6; 113m level 8; 1.5 minute cool down on L4.   Figure 8 wt'd ball in pillow case 2x 20sec with focus on weight shift  Standing on bosu (hardside) round the world wt'd ball in pillow case 2x 12 CGA for safety   Wt'd ball slam 2x 8; with diagonal x4 each side with good carry over of demo    Wt/d ball (4.4#/2kg) toss/catch at rebounder  on foam semi tandem x12 each      Seated rest breaks provided between exercises for recovery b/t exercises     PATIENT EDUCATION: Education details: therex form/technique Person educated: Patient Education method: ExConsulting civil engineerDemonstration, and Verbal cues Education comprehension: verbalized understanding, returned demonstration, and verbal cues required   HOME EXERCISE PROGRAM: Walking program  Clinical Impression: PT continued therex progression with reactionary balance and BLE and core strengthening with success.  PT challenged upright endurance as well with good response. Pt requires CGA for safety, but no use of minA or stepping strategy to maintain balance throughout session.  Pt is able to comply with all cuing for proper technique of therex with guarding for safety. PT will continue progresson as able.          PT Long Term Goals - 09/05/21 1545       PT LONG TERM GOAL #1   Title Patient will be independent with balance HEP to maximize safety in ADL, IADL, and fitness activity    Baseline walking only; 09/05/21 is walking for exercise    Time 4    Period Weeks    Status Achieved      PT LONG TERM GOAL #2   Title Pt to demonstrate improvement in minibesTest >3  points to demonstrate improved posture control and reduced falls risk.    Baseline 09/05/21 20/28    Time 5    Period Weeks    Status New    [place] Date 07/20/21      PT LONG TERM GOAL #3   Title Pt to perform SLS >15 sec bilat to indicate improved single limb motor control in gait.    Baseline 02/25/2019: 14sec (retest deferred to visit 2); 09/05/21 R 20sec; L 7sec    Time 6    Period Weeks    Status On-going      PT LONG TERM GOAL #4   Title Pt will improve 6 MWT >1581f to demonstrate improved cardiorespiratory fitness    Baseline 06/15/21: 1102f(gait belt, no AD);  09/05/21: 120051fgait belt, no AD)    Time 7    Period Weeks    Status On-going    [place] Date 11/25/21      PT LONG TERM GOAL #5   Title Pt to demonstrate 30sec chair rise >13 to indicate improved power in BLE.    Baseline 09/05/21 9    Time 8  [place] [name] 11/01/2021, 3:55 PM

## 2021-12-19 ENCOUNTER — Ambulatory Visit: Payer: BC Managed Care – PPO | Admitting: Physical Therapy

## 2021-12-26 ENCOUNTER — Ambulatory Visit: Payer: BC Managed Care – PPO | Attending: Neurology | Admitting: Physical Therapy

## 2021-12-26 DIAGNOSIS — R262 Difficulty in walking, not elsewhere classified: Secondary | ICD-10-CM | POA: Diagnosis present

## 2021-12-26 DIAGNOSIS — Z9181 History of falling: Secondary | ICD-10-CM | POA: Diagnosis present

## 2021-12-26 DIAGNOSIS — M6281 Muscle weakness (generalized): Secondary | ICD-10-CM | POA: Diagnosis present

## 2021-12-26 NOTE — Therapy (Signed)
OUTPATIENT PHYSICAL THERAPY TREATMENT NOTE/Progress Note Reporting Period 09/05/21 - 11/29/21    Patient Name: Michelle Ruiz MRN: [id number] DOB:[birth year], 51 y.o., female Today's Date: 11/01/2021  PCP: Halford Chessman PROVIDER: [employer]   PT End of Session - 11/01/21 1528     Visit Number 19    Number of Visits 28    Date for PT Re-Evaluation 11/28/21    Authorization Type BCBS COMM Pro    Authorization Time Period 06/15/21-09/07/21    Authorization - Visit Number 9    Progress Note Due on Visit 10    PT Start Time 8119    PT Stop Time 1551    PT Time Calculation (min) 39 min    Equipment Utilized During Treatment Gait belt    Activity Tolerance Patient tolerated treatment well;Patient limited by fatigue    Behavior During Therapy WFL for tasks assessed/performed                Past Medical History:  Diagnosis Date   Abnormal Pap smear of cervix    Actinic keratosis    Anxiety    Basal cell carcinoma 04/14/2008   Right upper ant. thigh. BCC with sclerosis.   Basal cell carcinoma 08/14/2008   Right lat. lower thigh. Superficial.    Basal cell carcinoma 04/29/2019   Right mid forearm. Superficial and nodular patterns. EDC.   Basal cell carcinoma 08/17/2020   Right upper antecubital. EDC.   Basal cell carcinoma 08/23/2021   Left Lower Leg, EDc   Kidney stones    MS (multiple sclerosis) ([place])    UTI (urinary tract infection)    Past Surgical History:  Procedure Laterality Date   BREAST BIOPSY Right 2010   stereotactic biopsy/ neg/ dr.byrnett   BREAST BIOPSY Left 2012   neg/dr. brynett   CESAREAN SECTION  2008   [place]   TONSILLECTOMY  2006   There are no problems to display for this patient.   REFERRING DIAG: MS  THERAPY DIAG:  Difficulty in walking, not elsewhere classified  Rationale for Evaluation and Treatment Rehabilitation  PERTINENT HISTORY: Patient reports history of falls x3 in the past 6 months secondary to drop foot on the L LE.  PMH: Multiple sclerosis; elbow fracture of the R UE  PRECAUTIONS: fall  SUBJECTIVE: Pt is doing well overall. Reports her legs felt heavy last night going upstairs to go to bed. Reports doing well with walking no falls  PAIN:  Are you having pain? No     TODAY'S TREATMENT:  There.ex:    Recumbent bike: 6.5 minutes total. 2 minutes L5, 1 min level 7; 81mn level 6; 163m level 8; 1.5 minute cool down on L4.   SLS (CLLE toe support only) alt across low reach to cone on 6in step   [employer]ops 10# 2x 8 with cuing for wt shifting  Lateral lunge onto large dina disc 2x 10      Seated rest breaks provided between exercises for recovery b/t exercises     PATIENT EDUCATION: Education details: therex form/technique Person educated: Patient Education method: ExConsulting civil engineerDemonstration, and Verbal cues Education comprehension: verbalized understanding, returned demonstration, and verbal cues required   HOME EXERCISE PROGRAM: Walking program  Clinical Impression: PT continued therex progression with reactionary balance and BLE and core strengthening with success. PT challenged upright endurance as well with good response. Pt requires CGA for safety, with use of minA or stepping strategy to maintain balance throughout cone touch exercise.  Pt is  able to comply with all cuing for proper technique of therex with guarding for safety. PT will continue progresson as able.          PT Long Term Goals - 09/05/21 1545       PT LONG TERM GOAL #1   Title Patient will be independent with balance HEP to maximize safety in ADL, IADL, and fitness activity    Baseline walking only; 09/05/21 is walking for exercise    Time 4    Period Weeks    Status Achieved      PT LONG TERM GOAL #2   Title Pt to demonstrate improvement in minibesTest >3 points to demonstrate improved posture control and reduced falls risk.    Baseline 09/05/21 20/28    Time 5    Period Weeks    Status New    Target  Date 07/20/21      PT LONG TERM GOAL #3   Title Pt to perform SLS >15 sec bilat to indicate improved single limb motor control in gait.    Baseline 02/25/2019: 14sec (retest deferred to visit 2); 09/05/21 R 20sec; L 7sec    Time 6    Period Weeks    Status On-going      PT LONG TERM GOAL #4   Title Pt will improve 6 MWT >1535f to demonstrate improved cardiorespiratory fitness    Baseline 06/15/21: 11040f(gait belt, no AD);  09/05/21: 120044fgait belt, no AD)    Time 7    Period Weeks    Status On-going    Target Date 11/25/21      PT LONG TERM GOAL #5   Title Pt to demonstrate 30sec chair rise >13 to indicate improved power in BLE.    Baseline 09/05/21 9    Time 8  [place] [name] 11/01/2021, 3:55 PM

## 2021-12-29 ENCOUNTER — Ambulatory Visit: Payer: BC Managed Care – PPO | Admitting: Dermatology

## 2021-12-29 ENCOUNTER — Ambulatory Visit: Payer: BC Managed Care – PPO

## 2021-12-29 DIAGNOSIS — L570 Actinic keratosis: Secondary | ICD-10-CM | POA: Diagnosis not present

## 2021-12-29 MED ORDER — AMINOLEVULINIC ACID HCL 20 % EX SOLR
1.0000 | Freq: Once | CUTANEOUS | Status: AC
  Administered 2021-12-29: 354 mg via TOPICAL

## 2021-12-29 NOTE — Progress Notes (Signed)
Patient completed PDT therapy today.  1. AK (actinic keratosis) (4) Chest - Medial [facility]); Left Breast (2); Right Breast  Photodynamic [facility] - [facility]) Procedure discussed: discussed risks, benefits, side effects. and alternatives   Prep: site scrubbed/prepped with acetone   Location:  Chest Number of lesions:  Multiple Type of treatment:  Blue light Aminolevulinic Acid (see MAR for details): Levulan Number of Levulan sticks used:  1 Incubation time (minutes):  120 Number of minutes under lamp:  16 Number of seconds under lamp:  40 Cooling:  Floor fan Outcome: patient tolerated procedure well with no complications   Post-procedure details: sunscreen applied and aftercare instructions given to patient    Related Medications Aminolevulinic Acid HCl 20 % SOLR 354 mg   [id number]  Abby Foster RMA  Documentation: I have reviewed the above documentation for accuracy and completeness, and I agree with the above.  Brendolyn Patty MD

## 2021-12-29 NOTE — Patient Instructions (Signed)

## 2022-01-05 ENCOUNTER — Encounter: Payer: Self-pay | Admitting: Physical Therapy

## 2022-01-05 ENCOUNTER — Ambulatory Visit: Payer: BC Managed Care – PPO | Admitting: Physical Therapy

## 2022-01-05 DIAGNOSIS — M6281 Muscle weakness (generalized): Secondary | ICD-10-CM

## 2022-01-05 DIAGNOSIS — R262 Difficulty in walking, not elsewhere classified: Secondary | ICD-10-CM

## 2022-01-05 NOTE — Therapy (Signed)
OUTPATIENT PHYSICAL THERAPY TREATMENT NOTE/Progress Note Reporting Period 09/05/21 - 11/29/21    Patient Name: Michelle Ruiz MRN: [id number] DOB:Sep 26, 1970, 51 y.o., female Today's Date: 11/01/2021  PCP: Halford Chessman PROVIDER: [employer]   PT End of Session - 11/01/21 1528     Visit Number 19    Number of Visits 28    Date for PT Re-Evaluation 11/28/21    Authorization Type BCBS COMM Pro    Authorization Time Period 06/15/21-09/07/21    Authorization - Visit Number 9    Progress Note Due on Visit 10    PT Start Time 7782    PT Stop Time 1551    PT Time Calculation (min) 39 min    Equipment Utilized During Treatment Gait belt    Activity Tolerance Patient tolerated treatment well;Patient limited by fatigue    Behavior During Therapy WFL for tasks assessed/performed                Past Medical History:  Diagnosis Date   Abnormal Pap smear of cervix    Actinic keratosis    Anxiety    Basal cell carcinoma 04/14/2008   Right upper ant. thigh. BCC with sclerosis.   Basal cell carcinoma 08/14/2008   Right lat. lower thigh. Superficial.    Basal cell carcinoma 04/29/2019   Right mid forearm. Superficial and nodular patterns. EDC.   Basal cell carcinoma 08/17/2020   Right upper antecubital. EDC.   Basal cell carcinoma 08/23/2021   Left Lower Leg, EDc   Kidney stones    MS (multiple sclerosis) ([place])    UTI (urinary tract infection)    Past Surgical History:  Procedure Laterality Date   BREAST BIOPSY Right 2010   stereotactic biopsy/ neg/ dr.byrnett   BREAST BIOPSY Left 2012   neg/dr. brynett   CESAREAN SECTION  2008   [place]   TONSILLECTOMY  2006   There are no problems to display for this patient.   REFERRING DIAG: MS  THERAPY DIAG:  Difficulty in walking, not elsewhere classified  Rationale for Evaluation and Treatment Rehabilitation  PERTINENT HISTORY: Patient reports history of falls x3 in the past 6 months secondary to drop foot on the L LE.  PMH: Multiple sclerosis; elbow fracture of the R UE  PRECAUTIONS: fall  SUBJECTIVE: Pt reports she had a fall, refers to it as "an aggressive, abrupt sit", reporting with cane she was able to control her lower. She was rounding a turn and her L ankle went wobbly and gave out.  PAIN:  Are you having pain? No     TODAY'S TREATMENT:  There.ex:    Recumbent bike: 6.5 minutes total. 2 minutes L5, 1 min level 7; 56mn level 6; 125m level 8; 1.5 minute cool down on L4.   Lateral lunge onto large dina disc 2x 10 each direction   Squat on bosu ball (hardside) 2x 10 with finger support with good carry over of demo  Abd on treadmill pushing belt 2x 12 with cuing for prevention of movement on standing leg (more difficult L>R)  Wt'd ball toss at rebounder in R and L semi tandem x12 each with supervision for safety     Seated rest breaks provided between exercises for recovery b/t exercises     PATIENT EDUCATION: Education details: therex form/technique Person educated: Patient Education method: ExConsulting civil engineerDemonstration, and Verbal cues Education comprehension: verbalized understanding, returned demonstration, and verbal cues required   HOME EXERCISE PROGRAM: Walking program  Clinical Impression: PT continued  therex progression with reactionary balance and BLE and core strengthening with success. PT challenged upright endurance as well with good response. Pt requires CGA for safety, with use of minA or stepping strategy to maintain balance throughout cone touch exercise.  Pt is able to comply with all cuing for proper technique of therex with guarding for safety. PT will continue progresson as able.          PT Long Term Goals - 09/05/21 1545       PT LONG TERM GOAL #1   Title Patient will be independent with balance HEP to maximize safety in ADL, IADL, and fitness activity    Baseline walking only; 09/05/21 is walking for exercise    Time 4    Period Weeks    Status  Achieved      PT LONG TERM GOAL #2   Title Pt to demonstrate improvement in minibesTest >3 points to demonstrate improved posture control and reduced falls risk.    Baseline 09/05/21 20/28    Time 5    Period Weeks    Status New    Target Date 07/20/21      PT LONG TERM GOAL #3   Title Pt to perform SLS >15 sec bilat to indicate improved single limb motor control in gait.    Baseline 02/25/2019: 14sec (retest deferred to visit 2); 09/05/21 R 20sec; L 7sec    Time 6    Period Weeks    Status On-going      PT LONG TERM GOAL #4   Title Pt will improve 6 MWT >1566f to demonstrate improved cardiorespiratory fitness    Baseline 06/15/21: 11042f(gait belt, no AD);  09/05/21: 120035fgait belt, no AD)    Time 7    Period Weeks    Status On-going    Target Date 11/25/21      PT LONG TERM GOAL #5   Title Pt to demonstrate 30sec chair rise >13 to indicate improved power in BLE.    Baseline 09/05/21 9    Time 8  [place] [name] 11/01/2021, 3:55 PM

## 2022-01-11 ENCOUNTER — Ambulatory Visit: Payer: BC Managed Care – PPO | Admitting: Physical Therapy

## 2022-01-11 DIAGNOSIS — Z9181 History of falling: Secondary | ICD-10-CM

## 2022-01-11 DIAGNOSIS — M6281 Muscle weakness (generalized): Secondary | ICD-10-CM | POA: Diagnosis not present

## 2022-01-11 DIAGNOSIS — R262 Difficulty in walking, not elsewhere classified: Secondary | ICD-10-CM

## 2022-01-11 NOTE — Therapy (Signed)
OUTPATIENT PHYSICAL THERAPY TREATMENT NOTE/Progress Note Reporting Period 09/05/21 - 11/29/21    Patient Name: Michelle Ruiz MRN: [id number] DOB:[birth year], 51 y.o., female Today's Date: 11/01/2021  PCP: Halford Chessman PROVIDER: [employer]   PT End of Session - 11/01/21 1528     Visit Number 19    Number of Visits 28    Date for PT Re-Evaluation 11/28/21    Authorization Type BCBS COMM Pro    Authorization Time Period 06/15/21-09/07/21    Authorization - Visit Number 9    Progress Note Due on Visit 10    PT Start Time 1194    PT Stop Time 1551    PT Time Calculation (min) 39 min    Equipment Utilized During Treatment Gait belt    Activity Tolerance Patient tolerated treatment well;Patient limited by fatigue    Behavior During Therapy WFL for tasks assessed/performed                Past Medical History:  Diagnosis Date   Abnormal Pap smear of cervix    Actinic keratosis    Anxiety    Basal cell carcinoma 04/14/2008   Right upper ant. thigh. BCC with sclerosis.   Basal cell carcinoma 08/14/2008   Right lat. lower thigh. Superficial.    Basal cell carcinoma 04/29/2019   Right mid forearm. Superficial and nodular patterns. EDC.   Basal cell carcinoma 08/17/2020   Right upper antecubital. EDC.   Basal cell carcinoma 08/23/2021   Left Lower Leg, EDc   Kidney stones    MS (multiple sclerosis) ([place])    UTI (urinary tract infection)    Past Surgical History:  Procedure Laterality Date   BREAST BIOPSY Right 2010   stereotactic biopsy/ neg/ dr.byrnett   BREAST BIOPSY Left 2012   neg/dr. brynett   CESAREAN SECTION  2008   [place]   TONSILLECTOMY  2006   There are no problems to display for this patient.   REFERRING DIAG: MS  THERAPY DIAG:  Difficulty in walking, not elsewhere classified  Rationale for Evaluation and Treatment Rehabilitation  PERTINENT HISTORY: Patient reports history of falls x3 in the past 6 months secondary to drop foot on the L LE.  PMH: Multiple sclerosis; elbow fracture of the R UE  PRECAUTIONS: fall  SUBJECTIVE: Pt reports no falls since last visit. Doing well overall   PAIN:  Are you having pain? No     TODAY'S TREATMENT:  There.ex:    Recumbent bike: 6.5 minutes total. 2 minutes L5, 1 min level 7; 71mn level 6; 117m level 8; 1.5 minute cool down on L4.   Alt lunge onto bosu (hardside) 2x 12   [place] 2x 20 steps standing rest breaks between  [employer]# 2x 8   Wt'd ball toss + catch with PT throwing ball outside BOS with good reaching from pt     Seated rest breaks provided between exercises for recovery b/t exercises     PATIENT EDUCATION: Education details: therex form/technique Person educated: Patient Education method: ExConsulting civil engineerDemonstration, and Verbal cues Education comprehension: verbalized understanding, returned demonstration, and verbal cues required   HOME EXERCISE PROGRAM: Walking program  Clinical Impression: PT continued therex progression with reactionary balance and BLE and core strengthening with success. PT continued upright endurance and reactive balance demand in therex with supervision needed for safety, excellent effort from patient throughout session. PT will continue progresson as able.          PT Long Term Goals -  09/05/21 1545       PT LONG TERM GOAL #1   Title Patient will be independent with balance HEP to maximize safety in ADL, IADL, and fitness activity    Baseline walking only; 09/05/21 is walking for exercise    Time 4    Period Weeks    Status Achieved      PT LONG TERM GOAL #2   Title Pt to demonstrate improvement in minibesTest >3 points to demonstrate improved posture control and reduced falls risk.    Baseline 09/05/21 20/28    Time 5    Period Weeks    Status New    Target Date 07/20/21      PT LONG TERM GOAL #3   Title Pt to perform SLS >15 sec bilat to indicate improved single limb motor control in gait.    Baseline  02/25/2019: 14sec (retest deferred to visit 2); 09/05/21 R 20sec; L 7sec    Time 6    Period Weeks    Status On-going      PT LONG TERM GOAL #4   Title Pt will improve 6 MWT >1546f to demonstrate improved cardiorespiratory fitness    Baseline 06/15/21: 11023f(gait belt, no AD);  09/05/21: 120012fgait belt, no AD)    Time 7    Period Weeks    Status On-going    Target Date 11/25/21      PT LONG TERM GOAL #5   Title Pt to demonstrate 30sec chair rise >13 to indicate improved power in BLE.    Baseline 09/05/21 9    Time 8  [place] [name] 11/01/2021, 3:55 PM

## 2022-01-12 ENCOUNTER — Encounter: Payer: Self-pay | Admitting: Physical Therapy

## 2022-01-18 ENCOUNTER — Encounter: Payer: Self-pay | Admitting: Physical Therapy

## 2022-01-18 ENCOUNTER — Ambulatory Visit: Payer: BC Managed Care – PPO | Admitting: Physical Therapy

## 2022-01-18 DIAGNOSIS — R262 Difficulty in walking, not elsewhere classified: Secondary | ICD-10-CM

## 2022-01-18 DIAGNOSIS — M6281 Muscle weakness (generalized): Secondary | ICD-10-CM | POA: Diagnosis not present

## 2022-01-18 NOTE — Therapy (Signed)
OUTPATIENT PHYSICAL THERAPY TREATMENT NOTE/Progress Note Reporting Period 09/05/21 - 11/29/21    Patient Name: Michelle Ruiz MRN: [id number] DOB:[birth year], 51 y.o., female Today's Date: 11/01/2021  PCP: Halford Chessman PROVIDER: [employer]   PT End of Session - 11/01/21 1528     Visit Number 19    Number of Visits 28    Date for PT Re-Evaluation 11/28/21    Authorization Type BCBS COMM Pro    Authorization Time Period 06/15/21-09/07/21    Authorization - Visit Number 9    Progress Note Due on Visit 10    PT Start Time 0034    PT Stop Time 1551    PT Time Calculation (min) 39 min    Equipment Utilized During Treatment Gait belt    Activity Tolerance Patient tolerated treatment well;Patient limited by fatigue    Behavior During Therapy WFL for tasks assessed/performed                Past Medical History:  Diagnosis Date   Abnormal Pap smear of cervix    Actinic keratosis    Anxiety    Basal cell carcinoma 04/14/2008   Right upper ant. thigh. BCC with sclerosis.   Basal cell carcinoma 08/14/2008   Right lat. lower thigh. Superficial.    Basal cell carcinoma 04/29/2019   Right mid forearm. Superficial and nodular patterns. EDC.   Basal cell carcinoma 08/17/2020   Right upper antecubital. EDC.   Basal cell carcinoma 08/23/2021   Left Lower Leg, EDc   Kidney stones    MS (multiple sclerosis) ([place])    UTI (urinary tract infection)    Past Surgical History:  Procedure Laterality Date   BREAST BIOPSY Right 2010   stereotactic biopsy/ neg/ dr.byrnett   BREAST BIOPSY Left 2012   neg/dr. brynett   CESAREAN SECTION  2008   [place]   TONSILLECTOMY  2006   There are no problems to display for this patient.   REFERRING DIAG: MS  THERAPY DIAG:  Difficulty in walking, not elsewhere classified  Rationale for Evaluation and Treatment Rehabilitation  PERTINENT HISTORY: Patient reports history of falls x3 in the past 6 months secondary to drop foot on the L LE.  PMH: Multiple sclerosis; elbow fracture of the R UE  PRECAUTIONS: fall  SUBJECTIVE: Pt reports no falls since last visit. Doing well overall   PAIN:  Are you having pain? No     TODAY'S TREATMENT:  There.ex:    Recumbent bike: 6.5 minutes total. 2 minutes L5, 1 min level 7; 70mn level 6; 162m level 8; 1.5 minute cool down on L4.   [place] x20 steps; with 3# x20 steps standing rest breaks between  Squat on bosu (hardside) 2x 12 with finger support  [employer] 3lb in pillow case 2x 12 with cuing for full weight shift and full ROM  Wt'd ball toss + catch with PT throwing ball outside BOS with good reaching from pt     Seated rest breaks provided between exercises for recovery b/t exercises     PATIENT EDUCATION: Education details: therex form/technique Person educated: Patient Education method: ExConsulting civil engineerDemonstration, and Verbal cues Education comprehension: verbalized understanding, returned demonstration, and verbal cues required   HOME EXERCISE PROGRAM: Walking program  Clinical Impression: PT continued therex progression with reactionary balance and BLE and core strengthening with success. PT continued upright endurance and reactive balance demand in therex with supervision needed for safety, excellent effort from patient throughout session. PT will continue progresson  as able.          PT Long Term Goals - 09/05/21 1545       PT LONG TERM GOAL #1   Title Patient will be independent with balance HEP to maximize safety in ADL, IADL, and fitness activity    Baseline walking only; 09/05/21 is walking for exercise    Time 4    Period Weeks    Status Achieved      PT LONG TERM GOAL #2   Title Pt to demonstrate improvement in minibesTest >3 points to demonstrate improved posture control and reduced falls risk.    Baseline 09/05/21 20/28    Time 5    Period Weeks    Status New    Target Date 07/20/21      PT LONG TERM GOAL #3   Title Pt to perform SLS  >15 sec bilat to indicate improved single limb motor control in gait.    Baseline 02/25/2019: 14sec (retest deferred to visit 2); 09/05/21 R 20sec; L 7sec    Time 6    Period Weeks    Status On-going      PT LONG TERM GOAL #4   Title Pt will improve 6 MWT >1515f to demonstrate improved cardiorespiratory fitness    Baseline 06/15/21: 11059f(gait belt, no AD);  09/05/21: 120061fgait belt, no AD)    Time 7    Period Weeks    Status On-going    Target Date 11/25/21      PT LONG TERM GOAL #5   Title Pt to demonstrate 30sec chair rise >13 to indicate improved power in BLE.    Baseline 09/05/21 9    Time 8  [place] [name] 11/01/2021, 3:55 PM

## 2022-01-25 ENCOUNTER — Ambulatory Visit: Payer: BC Managed Care – PPO | Attending: Neurology | Admitting: Physical Therapy

## 2022-01-25 DIAGNOSIS — Z9181 History of falling: Secondary | ICD-10-CM | POA: Insufficient documentation

## 2022-01-25 DIAGNOSIS — M6281 Muscle weakness (generalized): Secondary | ICD-10-CM | POA: Insufficient documentation

## 2022-01-25 DIAGNOSIS — R262 Difficulty in walking, not elsewhere classified: Secondary | ICD-10-CM | POA: Diagnosis present

## 2022-01-25 NOTE — Therapy (Signed)
OUTPATIENT PHYSICAL THERAPY TREATMENT NOTE/Progress Note Reporting Period 09/05/21 - 11/29/21    Patient Name: Michelle Ruiz MRN: [id number] DOB:[birth year], 51 y.o., female Today's Date: 11/01/2021  PCP: Halford Chessman PROVIDER: [employer]   PT End of Session - 11/01/21 1528     Visit Number 30    Number of Visits 34    Date for PT Re-Evaluation 02/21/22    Authorization Type BCBS COMM Pro    Authorization Time Period 06/15/21-09/07/21    Authorization - Visit Number 9    Progress Note Due on Visit 10    PT Start Time 9024    PT Stop Time 1551    PT Time Calculation (min) 39 min    Equipment Utilized During Treatment Gait belt    Activity Tolerance Patient tolerated treatment well;Patient limited by fatigue    Behavior During Therapy WFL for tasks assessed/performed                Past Medical History:  Diagnosis Date   Abnormal Pap smear of cervix    Actinic keratosis    Anxiety    Basal cell carcinoma 04/14/2008   Right upper ant. thigh. BCC with sclerosis.   Basal cell carcinoma 08/14/2008   Right lat. lower thigh. Superficial.    Basal cell carcinoma 04/29/2019   Right mid forearm. Superficial and nodular patterns. EDC.   Basal cell carcinoma 08/17/2020   Right upper antecubital. EDC.   Basal cell carcinoma 08/23/2021   Left Lower Leg, EDc   Kidney stones    MS (multiple sclerosis) ([place])    UTI (urinary tract infection)    Past Surgical History:  Procedure Laterality Date   BREAST BIOPSY Right 2010   stereotactic biopsy/ neg/ dr.byrnett   BREAST BIOPSY Left 2012   neg/dr. brynett   CESAREAN SECTION  2008   [place]   TONSILLECTOMY  2006   There are no problems to display for this patient.   REFERRING DIAG: MS  THERAPY DIAG:  Difficulty in walking, not elsewhere classified  Rationale for Evaluation and Treatment Rehabilitation  PERTINENT HISTORY: Patient reports history of falls x3 in the past 6 months secondary to drop foot on the L LE.  PMH: Multiple sclerosis; elbow fracture of the R UE  PRECAUTIONS: fall  SUBJECTIVE: Pt reports she is trying to work back up to her pre-covid walking time of 52mns. Currently completing 25ms 7x/week. Felt like more of fall risk following fatigue  PAIN:  Are you having pain? No     TODAY'S TREATMENT:  There.ex:    Recumbent bike: 6.5 minutes total. 2 minutes L5, 1 min level 7; 26m78mlevel 6; 26mi29mevel 8; 1.5 minute cool down on L4.   Deadmills 2x 30sec with patient able to complete with fatigue Abd deadmills 2x 30sec bilat with decreased speed to avoid spacticity of L lower leg (unilateral hand support)  Walking lunges 2x 12 with supervision for safety,needing foot down between steps to maintain balance Squat on bosu (hardside) 2x 12 with finger support  Wt'd ball toss + catch in narrow BOS with PT throwing ball outside BOS with good reaching from pt 2x12     Seated rest breaks provided between exercises for recovery b/t exercises     PATIENT EDUCATION: Education details: therex form/technique Person educated: Patient Education method: ExplConsulting civil engineermonstration, and Verbal cues Education comprehension: verbalized understanding, returned demonstration, and verbal cues required   HOME EXERCISE PROGRAM: Walking program   Clinical Impression: PT continued therex  progression for increased reactionary balance and generalized strength and conditioning needed for community activity with success. PT with increased endurance based progression due to patient new complaint of difficulty maintaining walking time previously following COVID infection. Pt is able to comply with all cuing for proper technique of therex with good effort throughout session. Pt with some degree of guarding throughout session for safety. PT will continue progression as able.     PT Long Term Goals - 09/05/21 1545       PT LONG TERM GOAL #1   Title Patient will be independent with balance HEP to maximize  safety in ADL, IADL, and fitness activity    Baseline walking only; 09/05/21 is walking for exercise    Time 4    Period Weeks    Status Achieved      PT LONG TERM GOAL #2   Title Pt to demonstrate improvement in minibesTest >3 points to demonstrate improved posture control and reduced falls risk.    Baseline 09/05/21 20/28    Time 5    Period Weeks    Status New    Target Date 07/20/21      PT LONG TERM GOAL #3   Title Pt to perform SLS >15 sec bilat to indicate improved single limb motor control in gait.    Baseline 02/25/2019: 14sec (retest deferred to visit 2); 09/05/21 R 20sec; L 7sec    Time 6    Period Weeks    Status On-going      PT LONG TERM GOAL #4   Title Pt will improve 6 MWT >1561f to demonstrate improved cardiorespiratory fitness    Baseline 06/15/21: 11064f(gait belt, no AD);  09/05/21: 120092fgait belt, no AD)    Time 7    Period Weeks    Status On-going    Target Date 11/25/21      PT LONG TERM GOAL #5   Title Pt to demonstrate 30sec chair rise >13 to indicate improved power in BLE.    Baseline 09/05/21 9    Time 8  [place] [name] 11/01/2021, 3:55 PM

## 2022-02-01 ENCOUNTER — Ambulatory Visit: Payer: BC Managed Care – PPO | Admitting: Physical Therapy

## 2022-02-01 DIAGNOSIS — M6281 Muscle weakness (generalized): Secondary | ICD-10-CM

## 2022-02-01 DIAGNOSIS — Z9181 History of falling: Secondary | ICD-10-CM

## 2022-02-01 DIAGNOSIS — R262 Difficulty in walking, not elsewhere classified: Secondary | ICD-10-CM

## 2022-02-01 NOTE — Therapy (Signed)
OUTPATIENT PHYSICAL THERAPY TREATMENT NOTE/Progress Note Reporting Period 09/05/21 - 11/29/21    Patient Name: Michelle Ruiz MRN: [id number] DOB:02/22/1971, 51 y.o., female Today's Date: 11/01/2021  PCP: Halford Chessman PROVIDER: [employer]   PT End of Session - 11/01/21 1528     Visit Number 30    Number of Visits 34    Date for PT Re-Evaluation 02/21/22    Authorization Type BCBS COMM Pro    Authorization Time Period 06/15/21-09/07/21    Authorization - Visit Number 9    Progress Note Due on Visit 10    PT Start Time 4403    PT Stop Time 1551    PT Time Calculation (min) 39 min    Equipment Utilized During Treatment Gait belt    Activity Tolerance Patient tolerated treatment well;Patient limited by fatigue    Behavior During Therapy WFL for tasks assessed/performed                Past Medical History:  Diagnosis Date   Abnormal Pap smear of cervix    Actinic keratosis    Anxiety    Basal cell carcinoma 04/14/2008   Right upper ant. thigh. BCC with sclerosis.   Basal cell carcinoma 08/14/2008   Right lat. lower thigh. Superficial.    Basal cell carcinoma 04/29/2019   Right mid forearm. Superficial and nodular patterns. EDC.   Basal cell carcinoma 08/17/2020   Right upper antecubital. EDC.   Basal cell carcinoma 08/23/2021   Left Lower Leg, EDc   Kidney stones    MS (multiple sclerosis) ([place])    UTI (urinary tract infection)    Past Surgical History:  Procedure Laterality Date   BREAST BIOPSY Right 2010   stereotactic biopsy/ neg/ dr.byrnett   BREAST BIOPSY Left 2012   neg/dr. brynett   CESAREAN SECTION  2008   [place]   TONSILLECTOMY  2006   There are no problems to display for this patient.   REFERRING DIAG: MS  THERAPY DIAG:  Difficulty in walking, not elsewhere classified  Rationale for Evaluation and Treatment Rehabilitation  PERTINENT HISTORY: Patient reports history of falls x3 in the past 6 months secondary to drop foot on the L LE.  PMH: Multiple sclerosis; elbow fracture of the R UE  PRECAUTIONS: fall  SUBJECTIVE: Pt reports she is trying to work back up to her pre-covid walking time of 541mns. Currently completing 289ms 7x/week. Felt like more of fall risk following fatigue  PAIN:  Are you having pain? No     TODAY'S TREATMENT:  There.ex:    Recumbent bike: 6.5 minutes total. 2 minutes L5, 1 min level 7; 41m441mlevel 6; 41mi55mevel 8; 1.5 minute cool down on L4.   [place] 3# AW 2x 30sec with patient able to complete with fatigue  Monster walks fwd and back RTB 30ft59fh cuing for technique with good carry over  R soccer ball kicks 2x 15 with guarding for safety  Abd deadmills 2x 30sec bilat with decreased speed to avoid spacticity of L lower leg (unilateral hand support)   Wt'd ball toss + catch in narrow BOS with PT throwing ball outside BOS with good reaching from pt 2x12     Seated rest breaks provided between exercises for recovery b/t exercises     PATIENT EDUCATION: Education details: therex form/technique Person educated: Patient Education method: Explanation, Demonstration, and Verbal cues Education comprehension: verbalized understanding, returned demonstration, and verbal cues required   HOME EXERCISE PROGRAM: Walking program  Clinical Impression: PT continued therex progression for increased reactionary balance and generalized strength and conditioning needed for community activity with success. Pt is motivated throughout session with no increased pain. Guarding throughout for safety PT will continue progression as able.     PT Long Term Goals - 09/05/21 1545       PT LONG TERM GOAL #1   Title Patient will be independent with balance HEP to maximize safety in ADL, IADL, and fitness activity    Baseline walking only; 09/05/21 is walking for exercise    Time 4    Period Weeks    Status Achieved      PT LONG TERM GOAL #2   Title Pt to demonstrate improvement in minibesTest  >3 points to demonstrate improved posture control and reduced falls risk.    Baseline 09/05/21 20/28    Time 5    Period Weeks    Status New    Target Date 07/20/21      PT LONG TERM GOAL #3   Title Pt to perform SLS >15 sec bilat to indicate improved single limb motor control in gait.    Baseline 02/25/2019: 14sec (retest deferred to visit 2); 09/05/21 R 20sec; L 7sec    Time 6    Period Weeks    Status On-going      PT LONG TERM GOAL #4   Title Pt will improve 6 MWT >1550f to demonstrate improved cardiorespiratory fitness    Baseline 06/15/21: 11018f(gait belt, no AD);  09/05/21: 120049fgait belt, no AD)    Time 7    Period Weeks    Status On-going    Target Date 11/25/21      PT LONG TERM GOAL #5   Title Pt to demonstrate 30sec chair rise >13 to indicate improved power in BLE.    Baseline 09/05/21 9    Time 8  [place] [name] 11/01/2021, 3:55 PM

## 2022-02-02 ENCOUNTER — Encounter: Payer: Self-pay | Admitting: Physical Therapy

## 2022-02-07 ENCOUNTER — Ambulatory Visit: Payer: BC Managed Care – PPO | Admitting: Physical Therapy

## 2022-02-07 ENCOUNTER — Encounter: Payer: Self-pay | Admitting: Physical Therapy

## 2022-02-07 DIAGNOSIS — M6281 Muscle weakness (generalized): Secondary | ICD-10-CM | POA: Diagnosis not present

## 2022-02-07 DIAGNOSIS — R262 Difficulty in walking, not elsewhere classified: Secondary | ICD-10-CM

## 2022-02-07 DIAGNOSIS — Z9181 History of falling: Secondary | ICD-10-CM

## 2022-02-07 NOTE — Therapy (Signed)
OUTPATIENT PHYSICAL THERAPY TREATMENT NOTE Reporting Period 09/05/21 - 11/29/21    Patient Name: Michelle Ruiz MRN: [id number] DOB:02-01-1971, 51 y.o., female Today's Date: 11/01/2021  PCP: Halford Chessman PROVIDER: [employer]   PT End of Session - 11/01/21 1528     Visit Number 32    Number of Visits 34    Date for PT Re-Evaluation 02/21/22    Authorization Type [employer]BS COMM Pro    Authorization Time Period 06/15/21-09/07/21    Authorization - Visit Number 2   Progress Note Due on Visit 10    PT Start Time 1430   PT Stop Time 1512   PT Time Calculation (min) 42 min    Equipment Utilized During Treatment Gait belt    Activity Tolerance Patient tolerated treatment well;Patient limited by fatigue    Behavior During Therapy WFL for tasks assessed/performed                Past Medical History:  Diagnosis Date   Abnormal Pap smear of cervix    Actinic keratosis    Anxiety    Basal cell carcinoma 04/14/2008   Right upper ant. thigh. BCC with sclerosis.   Basal cell carcinoma 08/14/2008   Right lat. lower thigh. Superficial.    Basal cell carcinoma 04/29/2019   Right mid forearm. Superficial and nodular patterns. EDC.   Basal cell carcinoma 08/17/2020   Right upper antecubital. EDC.   Basal cell carcinoma 08/23/2021   Left Lower Leg, EDc   Kidney stones    MS (multiple sclerosis) ([place])    UTI (urinary tract infection)    Past Surgical History:  Procedure Laterality Date   BREAST BIOPSY Right 2010   stereotactic biopsy/ neg/ dr.byrnett   BREAST BIOPSY Left 2012   neg/dr. brynett   CESAREAN SECTION  2008   [place]   TONSILLECTOMY  2006   There are no problems to display for this patient.   REFERRING DIAG: MS  THERAPY DIAG:  Difficulty in walking, not elsewhere classified  Rationale for Evaluation and Treatment Rehabilitation  PERTINENT HISTORY: Patient reports history of falls x3 in the past 6 months secondary to drop foot on the L LE. PMH: Multiple  sclerosis; elbow fracture of the R UE  PRECAUTIONS: fall  SUBJECTIVE: Pt reports getting flu shot this morning and has felt tired since. Pt reports no falls since previous visit.  PAIN:  Are you having pain? No     TODAY'S TREATMENT:  There.ex:    Recumbent bike: 6.5 minutes total. 2 minutes L5, 1 min level 7; 18mn level 6; 137m level 8; 1 minute L5; 30 seconds on L12   Deadmills with 4# AW 2x 35sec with patient able to complete with fatigue  Standing trampoline weighted ball toss 3x12; 3 kg ball; pt cued for soft hands while catching  Standing Kettlebell swings 2x10 10 pounds; pt cued for hip extension  Reverse lunge with 15# DB 5 reps bilateral, 2 sets  Reverse deadmills 2x 30sec bilat with decreased speed to avoid spacticity of L lower leg (unilateral hand support)  Monster walks fwd and back RTB 3041fith cuing for technique with good carry over     Seated rest breaks provided between exercises for recovery b/t exercises (two minutes between exercises)     PATIENT EDUCATION: Education details: therex form/technique Person educated: Patient Education method: ExpConsulting civil engineeremonstration, and Verbal cues Education comprehension: verbalized understanding, returned demonstration, and verbal cues required   HOME EXERCISE PROGRAM: Walking program  Clinical Impression:  PT progressed therapeutic exercises to increase strength and endurance of bilateral UEs and LEs for fatigue resistance. Pt is able to complete all therex variations during today's session with 2 minute rest breaks in between exercises and 30 second breaks between sets. Pt to be CGA during dynamic balance exercises. Patient will continue to benefit from skilled physical therapy to improve generalized strength for increased independence with ADL and community ambulation.    PT Long Term Goals - 09/05/21 1545       PT LONG TERM GOAL #1   Title Patient will be independent with balance HEP to maximize  safety in ADL, IADL, and fitness activity    Baseline walking only; 09/05/21 is walking for exercise    Time 4    Period Weeks    Status Achieved      PT LONG TERM GOAL #2   Title Pt to demonstrate improvement in minibesTest >3 points to demonstrate improved posture control and reduced falls risk.    Baseline 09/05/21 20/28    Time 5    Period Weeks    Status New    Target Date 07/20/21      PT LONG TERM GOAL #3   Title Pt to perform SLS >15 sec bilat to indicate improved single limb motor control in gait.    Baseline 02/25/2019: 14sec (retest deferred to visit 2); 09/05/21 R 20sec; L 7sec    Time 6    Period Weeks    Status On-going      PT LONG TERM GOAL #4   Title Pt will improve 6 MWT >1560f to demonstrate improved cardiorespiratory fitness    Baseline 06/15/21: 11083f(gait belt, no AD);  09/05/21: 120048fgait belt, no AD)    Time 7    Period Weeks    Status On-going    Target Date 11/25/21      PT LONG TERM GOAL #5   Title Pt to demonstrate 30sec chair rise >13 to indicate improved power in BLE.    Baseline 09/05/21 9    Time 8  [place] [name] 11/01/2021, 3:55 PM

## 2022-02-08 ENCOUNTER — Encounter: Payer: BC Managed Care – PPO | Admitting: Physical Therapy

## 2022-02-15 ENCOUNTER — Ambulatory Visit: Payer: BC Managed Care – PPO | Admitting: Physical Therapy

## 2022-02-22 ENCOUNTER — Ambulatory Visit: Payer: BC Managed Care – PPO | Admitting: Physical Therapy

## 2022-02-22 ENCOUNTER — Encounter: Payer: Self-pay | Admitting: Physical Therapy

## 2022-02-22 DIAGNOSIS — M6281 Muscle weakness (generalized): Secondary | ICD-10-CM | POA: Diagnosis not present

## 2022-02-22 DIAGNOSIS — R262 Difficulty in walking, not elsewhere classified: Secondary | ICD-10-CM

## 2022-02-22 NOTE — Therapy (Signed)
OUTPATIENT PHYSICAL THERAPY TREATMENT NOTE Reporting Period 02/22/22 - 05/17/2022   Patient Name: Michelle Ruiz MRN: [id number] DOB:May 17, 1970, 51 y.o., female Today's Date: 11/01/2021  PCP: Halford Chessman PROVIDER: [employer]   PT End of Session - 11/01/21 1528     Visit Number 32    Number of Visits 34    Date for PT Re-Evaluation 02/21/22    Authorization Type [employer] COMM Pro    Authorization Time Period 06/15/21-09/07/21    Authorization - Visit Number 2   Progress Note Due on Visit 10    PT Start Time 1430   PT Stop Time 1512   PT Time Calculation (min) 42 min    Equipment Utilized During Treatment Gait belt    Activity Tolerance Patient tolerated treatment well;Patient limited by fatigue    Behavior During Therapy WFL for tasks assessed/performed                Past Medical History:  Diagnosis Date   Abnormal Pap smear of cervix    Actinic keratosis    Anxiety    Basal cell carcinoma 04/14/2008   Right upper ant. thigh. BCC with sclerosis.   Basal cell carcinoma 08/14/2008   Right lat. lower thigh. Superficial.    Basal cell carcinoma 04/29/2019   Right mid forearm. Superficial and nodular patterns. EDC.   Basal cell carcinoma 08/17/2020   Right upper antecubital. EDC.   Basal cell carcinoma 08/23/2021   Left Lower Leg, EDc   Kidney stones    MS (multiple sclerosis) ([place])    UTI (urinary tract infection)    Past Surgical History:  Procedure Laterality Date   BREAST BIOPSY Right 2010   stereotactic biopsy/ neg/ dr.byrnett   BREAST BIOPSY Left 2012   neg/dr. brynett   CESAREAN SECTION  2008   [place]   TONSILLECTOMY  2006   There are no problems to display for this patient.   REFERRING DIAG: MS  THERAPY DIAG:  Difficulty in walking, not elsewhere classified  Rationale for Evaluation and Treatment Rehabilitation  PERTINENT HISTORY: Patient reports history of falls x3 in the past 6 months secondary to drop foot on the L LE. PMH: Multiple  sclerosis; elbow fracture of the R UE  PRECAUTIONS: fall  SUBJECTIVE: Pt reports getting flu shot this morning and has felt tired since. Pt reports no falls since previous visit.  PAIN:  Are you having pain? No     TODAY'S TREATMENT:  There.ex:    Recumbent bike: 6.5 minutes total. 2 minutes L5, 1 min level 7; 78mn level 6; 154m level 8; 1 minute L5; 30 seconds on L12   [place] narrow BOS 20# KB x12 each direction; same standing on foam x12 each direction  Alt SL leg deadlift with cone tap 2x 12 with occasional UE support for balance  Alt unilateral UE TRX lunge slider 2x 12 with good carry over of initial demo  Monster walks fwd and back RTB 3096fith cuing for technique with good carry over     Seated rest breaks provided between exercises for recovery b/t exercises (two minutes between exercises)     PATIENT EDUCATION: Education details: therex form/technique Person educated: Patient Education method: ExpConsulting civil engineeremonstration, and Verbal cues Education comprehension: verbalized understanding, returned demonstration, and verbal cues required   HOME EXERCISE PROGRAM: Walking program   Clinical Impression:  PT continued therex progression for increased dynamic balance and endurance. Patient is able to comply with all cuing for proper  technique of therex with rest breaks needed to prevent fatigue. Patient with supervision for safety, but demonstrates good self-balance correction throughout session. Patient is continuing to benefit from skilled therapy to increase/maintain endurance level and decrease fall risk. PT will continue progression as able.       PT Long Term Goals - 09/05/21 1545       PT LONG TERM GOAL #1   Title Patient will be independent with balance HEP to maximize safety in ADL, IADL, and fitness activity    Baseline walking only; 09/05/21 is walking for exercise    Time 4    Period Weeks    Status Achieved      PT LONG TERM GOAL #2    Title Pt to demonstrate improvement in minibesTest >3 points to demonstrate improved posture control and reduced falls risk.    Baseline 09/05/21 20/28    Time 5    Period Weeks    Status New    Target Date 07/20/21      PT LONG TERM GOAL #3   Title Pt to perform SLS >15 sec bilat to indicate improved single limb motor control in gait.    Baseline 02/25/2019: 14sec (retest deferred to visit 2); 09/05/21 R 20sec; L 7sec    Time 6    Period Weeks    Status On-going      PT LONG TERM GOAL #4   Title Pt will improve 6 MWT >1535f to demonstrate improved cardiorespiratory fitness    Baseline 06/15/21: 11071f(gait belt, no AD);  09/05/21: 120069fgait belt, no AD)    Time 7    Period Weeks    Status On-going    Target Date 11/25/21      PT LONG TERM GOAL #5   Title Pt to demonstrate 30sec chair rise >13 to indicate improved power in BLE.    Baseline 09/05/21 9    Time 8  [place] [name] 11/01/2021, 3:55 PM

## 2022-02-28 ENCOUNTER — Ambulatory Visit: Payer: BC Managed Care – PPO | Attending: Neurology | Admitting: Physical Therapy

## 2022-02-28 DIAGNOSIS — Z9181 History of falling: Secondary | ICD-10-CM | POA: Insufficient documentation

## 2022-02-28 DIAGNOSIS — M6281 Muscle weakness (generalized): Secondary | ICD-10-CM | POA: Insufficient documentation

## 2022-02-28 DIAGNOSIS — R262 Difficulty in walking, not elsewhere classified: Secondary | ICD-10-CM | POA: Diagnosis present

## 2022-02-28 NOTE — Therapy (Signed)
OUTPATIENT PHYSICAL THERAPY TREATMENT NOTE Reporting Period 02/22/22 - 05/17/2022   Patient Name: Michelle Ruiz MRN: [id number] DOB:03/12/1971, 51 y.o., female Today's Date: 11/01/2021  PCP: Halford Chessman PROVIDER: [employer]   PT End of Session - 11/01/21 1528     Visit Number 32    Number of Visits 34    Date for PT Re-Evaluation 02/21/22    Authorization Type BCBS COMM Pro    Authorization Time Period 06/15/21-09/07/21    Authorization - Visit Number 2   Progress Note Due on Visit 10    PT Start Time 1430   PT Stop Time 1512   PT Time Calculation (min) 42 min    Equipment Utilized During Treatment Gait belt    Activity Tolerance Patient tolerated treatment well;Patient limited by fatigue    Behavior During Therapy WFL for tasks assessed/performed                Past Medical History:  Diagnosis Date   Abnormal Pap smear of cervix    Actinic keratosis    Anxiety    Basal cell carcinoma 04/14/2008   Right upper ant. thigh. BCC with sclerosis.   Basal cell carcinoma 08/14/2008   Right lat. lower thigh. Superficial.    Basal cell carcinoma 04/29/2019   Right mid forearm. Superficial and nodular patterns. EDC.   Basal cell carcinoma 08/17/2020   Right upper antecubital. EDC.   Basal cell carcinoma 08/23/2021   Left Lower Leg, EDc   Kidney stones    MS (multiple sclerosis) ([place])    UTI (urinary tract infection)    Past Surgical History:  Procedure Laterality Date   BREAST BIOPSY Right 2010   stereotactic biopsy/ neg/ dr.byrnett   BREAST BIOPSY Left 2012   neg/dr. brynett   CESAREAN SECTION  2008   [place]   TONSILLECTOMY  2006   There are no problems to display for this patient.   REFERRING DIAG: MS  THERAPY DIAG:  Difficulty in walking, not elsewhere classified  Rationale for Evaluation and Treatment Rehabilitation  PERTINENT HISTORY: Patient reports history of falls x3 in the past 6 months secondary to drop foot on the L LE. PMH: Multiple  sclerosis; elbow fracture of the R UE  PRECAUTIONS: fall  SUBJECTIVE: Pt reports "good" soreness after last treatments. Pt reports catching herself from falling going up the stairs last week going into work Fatigue level 0/10.   PAIN:  Are you having pain? No         TODAY'S TREATMENT:  There.ex:    Nustep; 6 minutes 30 seconds first 3 minutes level 4, last 3 minutes 30 seconds level 6.   Total gym single leg leg press level 26 2 sets, 8 reps; bilaterally  STS with 15 pound ball slams, 2 sets 5 reps   12 inch step ups bilaterally; pt cued for slow eccentric control   Alt SL leg deadlift with cone tap 2x 5 with occasional CGA for balance   Dead mills; 2 sets of 30 second runs; pt cued for foot position for initiating treadmill movement   Monster walks fwd and back RTB 3f with cuing for technique with good carry over 2 sets     Seated rest breaks provided between exercises for recovery b/t exercises (two minutes between exercises)         PATIENT EDUCATION: Education details: therex form/technique Person educated: Patient Education method: Explanation, Demonstration, and Verbal cues Education comprehension: verbalized understanding, returned demonstration, and verbal cues  required     HOME EXERCISE PROGRAM: Walking program                Clinical Impression:   PT continued therex progression for increased dynamic balance and fatigue resistance. Pt is able to comply with increasing intensity exercises with rest breaks in between sets and exercises. Pt requires CGA during single leg stance exercises on LLE but demonstrates ability to catch self with UEs and RLE. Patient will continue to benefit from skilled physical therapy to improve fatigue resistance and LLE strength for increased independence with ADL and to decrease fall risk.      PT Long Term Goals - 09/05/21 1545       PT LONG TERM GOAL #1   Title Patient will be independent with balance HEP to maximize  safety in ADL, IADL, and fitness activity    Baseline walking only; 09/05/21 is walking for exercise    Time 4    Period Weeks    Status Achieved      PT LONG TERM GOAL #2   Title Pt to demonstrate improvement in minibesTest >3 points to demonstrate improved posture control and reduced falls risk.    Baseline 09/05/21 20/28    Time 5    Period Weeks    Status New    Target Date 07/20/21      PT LONG TERM GOAL #3   Title Pt to perform SLS >15 sec bilat to indicate improved single limb motor control in gait.    Baseline 02/25/2019: 14sec (retest deferred to visit 2); 09/05/21 R 20sec; L 7sec    Time 6    Period Weeks    Status On-going      PT LONG TERM GOAL #4   Title Pt will improve 6 MWT >1532f to demonstrate improved cardiorespiratory fitness    Baseline 06/15/21: 11066f(gait belt, no AD);  09/05/21: 120029fgait belt, no AD)    Time 7    Period Weeks    Status On-going    Target Date 11/25/21      PT LONG TERM GOAL #5   Title Pt to demonstrate 30sec chair rise >13 to indicate improved power in BLE.    Baseline 09/05/21 9    Time 8  [place] [name] 11/01/2021, 3:55 PM

## 2022-03-01 ENCOUNTER — Encounter: Payer: Self-pay | Admitting: Physical Therapy

## 2022-03-01 ENCOUNTER — Encounter: Payer: BC Managed Care – PPO | Admitting: Physical Therapy

## 2022-03-06 ENCOUNTER — Ambulatory Visit: Payer: BC Managed Care – PPO | Admitting: Dermatology

## 2022-03-08 ENCOUNTER — Ambulatory Visit: Payer: BC Managed Care – PPO | Admitting: Physical Therapy

## 2022-03-08 ENCOUNTER — Encounter: Payer: Self-pay | Admitting: Physical Therapy

## 2022-03-08 DIAGNOSIS — Z9181 History of falling: Secondary | ICD-10-CM

## 2022-03-08 DIAGNOSIS — M6281 Muscle weakness (generalized): Secondary | ICD-10-CM

## 2022-03-08 DIAGNOSIS — R262 Difficulty in walking, not elsewhere classified: Secondary | ICD-10-CM

## 2022-03-08 NOTE — Therapy (Signed)
OUTPATIENT PHYSICAL THERAPY TREATMENT NOTE Reporting Period 02/22/22 - 05/17/2022   Patient Name: Michelle Ruiz MRN: [id number] DOB:Aug 15, 1970, 51 y.o., female Today's Date: 11/01/2021  PCP: Halford Chessman PROVIDER: [employer]   PT End of Session - 11/01/21 1528     Visit Number 32    Number of Visits 34    Date for PT Re-Evaluation 02/21/22    Authorization Type BCBS COMM Pro    Authorization Time Period 06/15/21-09/07/21    Authorization - Visit Number 2   Progress Note Due on Visit 10    PT Start Time 1430   PT Stop Time 1512   PT Time Calculation (min) 42 min    Equipment Utilized During Treatment Gait belt    Activity Tolerance Patient tolerated treatment well;Patient limited by fatigue    Behavior During Therapy WFL for tasks assessed/performed                Past Medical History:  Diagnosis Date   Abnormal Pap smear of cervix    Actinic keratosis    Anxiety    Basal cell carcinoma 04/14/2008   Right upper ant. thigh. BCC with sclerosis.   Basal cell carcinoma 08/14/2008   Right lat. lower thigh. Superficial.    Basal cell carcinoma 04/29/2019   Right mid forearm. Superficial and nodular patterns. EDC.   Basal cell carcinoma 08/17/2020   Right upper antecubital. EDC.   Basal cell carcinoma 08/23/2021   Left Lower Leg, EDc   Kidney stones    MS (multiple sclerosis) ([place])    UTI (urinary tract infection)    Past Surgical History:  Procedure Laterality Date   BREAST BIOPSY Right 2010   stereotactic biopsy/ neg/ dr.byrnett   BREAST BIOPSY Left 2012   neg/dr. brynett   CESAREAN SECTION  2008   [place]   TONSILLECTOMY  2006   There are no problems to display for this patient.   REFERRING DIAG: MS  THERAPY DIAG:  Difficulty in walking, not elsewhere classified  Rationale for Evaluation and Treatment Rehabilitation  PERTINENT HISTORY: Patient reports history of falls x3 in the past 6 months secondary to drop foot on the L LE. PMH: Multiple  sclerosis; elbow fracture of the R UE  PRECAUTIONS: fall  SUBJECTIVE: Pt reports "good" soreness after last treatments. Pt reports catching herself from falling going up the stairs last week going into work Fatigue level 0/10.   PAIN:  Are you having pain? No         TODAY'S TREATMENT:  There.ex:    Recumbent bike: 6.5 minutes total. 2 minutes L5, 1 min level 7; 46mn level 6; 142m level 8; 1 minute L5; 30 seconds on L12    Deadmill abduction 2x 30sec bilat with more difficulty with LLE push   KB swing 10# 2x 12 with good carry over of demo  Total gym single leg leg press level 26 2 sets, 8 reps; bilaterally (RLE for push off only for LLE push)   20075fotal: 2 rounds without rest of the following CGA  57f16fck walk 10ft87feral step 57ft 68fstep 10ft l69fal step      Seated rest breaks provided between exercises for recovery b/t exercises (two minutes between exercises)         PATIENT EDUCATION: Education details: therex form/technique Person educated: Patient Education method: ExplanaConsulting civil engineerstration, and Verbal cues Education comprehension: verbalized understanding, returned demonstration, and verbal cues required     HOME EXERCISE PROGRAM: Walking program  Clinical Impression:   PT continued therex progression for increased dynamic balance and fatigue resistance. Pt is able to comply with increasing intensity exercises with rest breaks in between sets and exercises. Pt requires CGA during single leg stance exercises on LLE but demonstrates ability to catch self with UEs and RLE. Patient will continue to benefit from skilled physical therapy to improve fatigue resistance and LLE strength for increased independence with ADL and to decrease fall risk.      PT Long Term Goals - 09/05/21 1545       PT LONG TERM GOAL #1   Title Patient will be independent with balance HEP to maximize safety in ADL, IADL, and fitness activity    Baseline  walking only; 09/05/21 is walking for exercise    Time 4    Period Weeks    Status Achieved      PT LONG TERM GOAL #2   Title Pt to demonstrate improvement in minibesTest >3 points to demonstrate improved posture control and reduced falls risk.    Baseline 09/05/21 20/28    Time 5    Period Weeks    Status New    Target Date 07/20/21      PT LONG TERM GOAL #3   Title Pt to perform SLS >15 sec bilat to indicate improved single limb motor control in gait.    Baseline 02/25/2019: 14sec (retest deferred to visit 2); 09/05/21 R 20sec; L 7sec    Time 6    Period Weeks    Status On-going      PT LONG TERM GOAL #4   Title Pt will improve 6 MWT >1517f to demonstrate improved cardiorespiratory fitness    Baseline 06/15/21: 11077f(gait belt, no AD);  09/05/21: 120072fgait belt, no AD)    Time 7    Period Weeks    Status On-going    Target Date 11/25/21      PT LONG TERM GOAL #5   Title Pt to demonstrate 30sec chair rise >13 to indicate improved power in BLE.    Baseline 09/05/21 9    Time 8  [place] [name] 11/01/2021, 3:55 PM

## 2022-03-15 ENCOUNTER — Encounter: Payer: Self-pay | Admitting: Physical Therapy

## 2022-03-15 ENCOUNTER — Ambulatory Visit: Payer: BC Managed Care – PPO | Admitting: Physical Therapy

## 2022-03-15 DIAGNOSIS — R262 Difficulty in walking, not elsewhere classified: Secondary | ICD-10-CM

## 2022-03-15 DIAGNOSIS — M6281 Muscle weakness (generalized): Secondary | ICD-10-CM

## 2022-03-15 NOTE — Therapy (Signed)
OUTPATIENT PHYSICAL THERAPY TREATMENT NOTE Reporting Period 02/22/22 - 05/17/2022   Patient Name: Michelle Ruiz MRN: [id number] DOB:June 28, 1970, 51 y.o., female Today's Date: 11/01/2021  PCP: Halford Chessman PROVIDER: [employer]   PT End of Session - 11/01/21 1528     Visit Number 32    Number of Visits 34    Date for PT Re-Evaluation 02/21/22    Authorization Type BCBS COMM Pro    Authorization Time Period 06/15/21-09/07/21    Authorization - Visit Number 2   Progress Note Due on Visit 10    PT Start Time 1430   PT Stop Time 1512   PT Time Calculation (min) 42 min    Equipment Utilized During Treatment Gait belt    Activity Tolerance Patient tolerated treatment well;Patient limited by fatigue    Behavior During Therapy WFL for tasks assessed/performed                Past Medical History:  Diagnosis Date   Abnormal Pap smear of cervix    Actinic keratosis    Anxiety    Basal cell carcinoma 04/14/2008   Right upper ant. thigh. BCC with sclerosis.   Basal cell carcinoma 08/14/2008   Right lat. lower thigh. Superficial.    Basal cell carcinoma 04/29/2019   Right mid forearm. Superficial and nodular patterns. EDC.   Basal cell carcinoma 08/17/2020   Right upper antecubital. EDC.   Basal cell carcinoma 08/23/2021   Left Lower Leg, EDc   Kidney stones    MS (multiple sclerosis) ([place])    UTI (urinary tract infection)    Past Surgical History:  Procedure Laterality Date   BREAST BIOPSY Right 2010   stereotactic biopsy/ neg/ dr.byrnett   BREAST BIOPSY Left 2012   neg/dr. brynett   CESAREAN SECTION  2008   [place]   TONSILLECTOMY  2006   There are no problems to display for this patient.   REFERRING DIAG: MS  THERAPY DIAG:  Difficulty in walking, not elsewhere classified  Rationale for Evaluation and Treatment Rehabilitation  PERTINENT HISTORY: Patient reports history of falls x3 in the past 6 months secondary to drop foot on the L LE. PMH: Multiple  sclerosis; elbow fracture of the R UE  PRECAUTIONS: fall  SUBJECTIVE: Pt reports "good" soreness after last treatments. Pt reports catching herself from falling going up the stairs last week going into work Fatigue level 0/10.   PAIN:  Are you having pain? No         TODAY'S TREATMENT:  There.ex:    Recumbent bike: 6.5 minutes total. 2 minutes L5, 1 min level 8; 1mn level 7; 165m level 9; 1 minute L5; 30 seconds on L12     KB swing 20# 2x 12/15 with good carry over of demo  Alt lunge onto bosu ball hardside 2x 12 with unilateral UE support   Monster walks fwd and back 2x 3020fTB with supervision for safety       Seated rest breaks provided between exercises for recovery b/t exercises (two minutes between exercises)         PATIENT EDUCATION: Education details: therex form/technique Person educated: Patient Education method: ExpConsulting civil engineeremonstration, and Verbal cues Education comprehension: verbalized understanding, returned demonstration, and verbal cues required     HOME EXERCISE PROGRAM: Walking program                Clinical Impression:   PT continued therex progression for increased dynamic balance and fatigue resistance. Pt  is able to comply with increasing intensity exercises with rest breaks in between sets and exercises. Pt requires CGA during single leg stance exercises on LLE but demonstrates ability to catch self with UEs and RLE. Patient will continue to benefit from skilled physical therapy to improve fatigue resistance and LLE strength for increased independence with ADL and to decrease fall risk.      PT Long Term Goals - 09/05/21 1545       PT LONG TERM GOAL #1   Title Patient will be independent with balance HEP to maximize safety in ADL, IADL, and fitness activity    Baseline walking only; 09/05/21 is walking for exercise    Time 4    Period Weeks    Status Achieved      PT LONG TERM GOAL #2   Title Pt to demonstrate improvement in  minibesTest >3 points to demonstrate improved posture control and reduced falls risk.    Baseline 09/05/21 20/28    Time 5    Period Weeks    Status New    Target Date 07/20/21      PT LONG TERM GOAL #3   Title Pt to perform SLS >15 sec bilat to indicate improved single limb motor control in gait.    Baseline 02/25/2019: 14sec (retest deferred to visit 2); 09/05/21 R 20sec; L 7sec    Time 6    Period Weeks    Status On-going      PT LONG TERM GOAL #4   Title Pt will improve 6 MWT >1519f to demonstrate improved cardiorespiratory fitness    Baseline 06/15/21: 1104f(gait belt, no AD);  09/05/21: 120080fgait belt, no AD)    Time 7    Period Weeks    Status On-going    Target Date 11/25/21      PT LONG TERM GOAL #5   Title Pt to demonstrate 30sec chair rise >13 to indicate improved power in BLE.    Baseline 09/05/21 9    Time 8  [place] [name] 11/01/2021, 3:55 PM    [facility] [address],[place]one: [phone]:  [phone] Details  Name: Michelle Ruiz: [id number] of Birth: [birth year]ferring Provider:  ShaVladimir CroftsD  Encounter Date: 03/15/2022   [name] 03/15/2022, 11:27 AM  [facility] [address],[place]one: [phone]:  [phone]

## 2022-03-16 ENCOUNTER — Ambulatory Visit (INDEPENDENT_AMBULATORY_CARE_PROVIDER_SITE_OTHER): Payer: BC Managed Care – PPO | Admitting: Dermatology

## 2022-03-16 DIAGNOSIS — D2262 Melanocytic nevi of left upper limb, including shoulder: Secondary | ICD-10-CM

## 2022-03-16 DIAGNOSIS — D229 Melanocytic nevi, unspecified: Secondary | ICD-10-CM

## 2022-03-16 DIAGNOSIS — Z85828 Personal history of other malignant neoplasm of skin: Secondary | ICD-10-CM

## 2022-03-16 DIAGNOSIS — L700 Acne vulgaris: Secondary | ICD-10-CM | POA: Diagnosis not present

## 2022-03-16 DIAGNOSIS — D225 Melanocytic nevi of trunk: Secondary | ICD-10-CM | POA: Diagnosis not present

## 2022-03-16 DIAGNOSIS — L821 Other seborrheic keratosis: Secondary | ICD-10-CM

## 2022-03-16 DIAGNOSIS — D2272 Melanocytic nevi of left lower limb, including hip: Secondary | ICD-10-CM

## 2022-03-16 DIAGNOSIS — Z1283 Encounter for screening for malignant neoplasm of skin: Secondary | ICD-10-CM | POA: Diagnosis not present

## 2022-03-16 DIAGNOSIS — L578 Other skin changes due to chronic exposure to nonionizing radiation: Secondary | ICD-10-CM

## 2022-03-16 DIAGNOSIS — L738 Other specified follicular disorders: Secondary | ICD-10-CM | POA: Diagnosis not present

## 2022-03-16 DIAGNOSIS — L820 Inflamed seborrheic keratosis: Secondary | ICD-10-CM | POA: Diagnosis not present

## 2022-03-16 DIAGNOSIS — B079 Viral wart, unspecified: Secondary | ICD-10-CM

## 2022-03-16 DIAGNOSIS — D2271 Melanocytic nevi of right lower limb, including hip: Secondary | ICD-10-CM

## 2022-03-16 DIAGNOSIS — L814 Other melanin hyperpigmentation: Secondary | ICD-10-CM

## 2022-03-16 DIAGNOSIS — L570 Actinic keratosis: Secondary | ICD-10-CM

## 2022-03-16 NOTE — Patient Instructions (Signed)
Cryotherapy Aftercare  Wash gently with soap and water everyday.   Apply Vaseline and Band-Aid daily until healed.   Melanoma ABCDEs  Melanoma is the most dangerous type of skin cancer, and is the leading cause of death from skin disease.  You are more likely to develop melanoma if you: Have light-colored skin, light-colored eyes, or red or blond hair Spend a lot of time in the sun Tan regularly, either outdoors or in a tanning bed Have had blistering sunburns, especially during childhood Have a close family member who has had a melanoma Have atypical moles or large birthmarks  Early detection of melanoma is key since treatment is typically straightforward and cure rates are extremely high if we catch it early.   The first sign of melanoma is often a change in a mole or a new dark spot.  The ABCDE system is a way of remembering the signs of melanoma.  A for asymmetry:  The two halves do not match. B for border:  The edges of the growth are irregular. C for color:  A mixture of colors are present instead of an even brown color. D for diameter:  Melanomas are usually (but not always) greater than 6mm - the size of a pencil eraser. E for evolution:  The spot keeps changing in size, shape, and color.  Please check your skin once per month between visits. You can use a small mirror in front and a large mirror behind you to keep an eye on the back side or your body.   If you see any new or changing lesions before your next follow-up, please call to schedule a visit.  Please continue daily skin protection including broad spectrum sunscreen SPF 30+ to sun-exposed areas, reapplying every 2 hours as needed when you're outdoors.     Due to recent changes in healthcare laws, you may see results of your pathology and/or laboratory studies on MyChart before the doctors have had a chance to review them. We understand that in some cases there may be results that are confusing or concerning to you.  Please understand that not all results are received at the same time and often the doctors may need to interpret multiple results in order to provide you with the best plan of care or course of treatment. Therefore, we ask that you please give us 2 business days to thoroughly review all your results before contacting the office for clarification. Should we see a critical lab result, you will be contacted sooner.   If You Need Anything After Your Visit  If you have any questions or concerns for your doctor, please call our main line at [phone] and press option 4 to reach your doctor's medical assistant. If no one answers, please leave a voicemail as directed and we will return your call as soon as possible. Messages left after 4 pm will be answered the following business day.   You may also send us a message via MyChart. We typically respond to MyChart messages within 1-2 business days.  For prescription refills, please ask your pharmacy to contact our office. Our fax number is [phone].  If you have an urgent issue when the clinic is closed that cannot wait until the next business day, you can page your doctor at the number below.    Please note that while we do our best to be available for urgent issues outside of office hours, we are not available 24/7.   If you have an urgent   issue and are unable to reach us, you may choose to seek medical care at your doctor's office, retail clinic, urgent care center, or emergency room.  If you have a medical emergency, please immediately call 911 or go to the emergency department.  Pager Numbers  - Dr. Kowalski: [phone]  - Dr. Moye: [phone]  - Dr. Stewart: [phone]  In the event of inclement weather, please call our main line at [phone] for an update on the status of any delays or closures.  Dermatology Medication Tips: Please keep the boxes that topical medications come in in order to help keep track of the  instructions about where and how to use these. Pharmacies typically print the medication instructions only on the boxes and not directly on the medication tubes.   If your medication is too expensive, please contact our office at [phone] option 4 or send us a message through MyChart.   We are unable to tell what your co-pay for medications will be in advance as this is different depending on your insurance coverage. However, we may be able to find a substitute medication at lower cost or fill out paperwork to get insurance to cover a needed medication.   If a prior authorization is required to get your medication covered by your insurance company, please allow us 1-2 business days to complete this process.  Drug prices often vary depending on where the prescription is filled and some pharmacies may offer cheaper prices.  The website www.goodrx.com contains coupons for medications through different pharmacies. The prices here do not account for what the cost may be with help from insurance (it may be cheaper with your insurance), but the website can give you the price if you did not use any insurance.  - You can print the associated coupon and take it with your prescription to the pharmacy.  - You may also stop by our office during regular business hours and pick up a GoodRx coupon card.  - If you need your prescription sent electronically to a different pharmacy, notify our office through [facility] MyChart or by phone at [phone] option 4.     Si Usted Necesita Algo Despus de Su Visita  Tambin puede enviarnos un mensaje a travs de MyChart. Por lo general respondemos a los mensajes de MyChart en el transcurso de 1 a 2 das hbiles.  Para renovar recetas, por favor pida a su farmacia que se ponga en contacto con nuestra oficina. Nuestro nmero de fax es el [phone].  Si tiene un asunto urgente cuando la clnica est cerrada y que no puede esperar hasta el siguiente da hbil,  puede llamar/localizar a su doctor(a) al nmero que aparece a continuacin.   Por favor, tenga en cuenta que aunque hacemos todo lo posible para estar disponibles para asuntos urgentes fuera del horario de oficina, no estamos disponibles las 24 horas del da, los 7 das de la semana.   Si tiene un problema urgente y no puede comunicarse con nosotros, puede optar por buscar atencin mdica  en el consultorio de su doctor(a), en una clnica privada, en un centro de atencin urgente o en una sala de emergencias.  Si tiene una emergencia mdica, por favor llame inmediatamente al 911 o vaya a la sala de emergencias.  Nmeros de bper  - Dr. Kowalski: [phone]  - Dra. Moye: [phone]  - Dra. Stewart: [phone]  En caso de inclemencias del tiempo, por favor llame a nuestra lnea principal al [phone] para una actualizacin   sobre el estado de cualquier retraso o cierre.  Consejos para la medicacin en dermatologa: Por favor, guarde las cajas en las que vienen los medicamentos de uso tpico para ayudarle a seguir las instrucciones sobre dnde y cmo usarlos. Las farmacias generalmente imprimen las instrucciones del medicamento slo en las cajas y no directamente en los tubos del medicamento.   Si su medicamento es muy caro, por favor, pngase en contacto con nuestra oficina llamando al [phone] y presione la opcin 4 o envenos un mensaje a travs de MyChart.   No podemos decirle cul ser su copago por los medicamentos por adelantado ya que esto es diferente dependiendo de la cobertura de su seguro. Sin embargo, es posible que podamos encontrar un medicamento sustituto a menor costo o llenar un formulario para que el seguro cubra el medicamento que se considera necesario.   Si se requiere una autorizacin previa para que su compaa de seguros cubra su medicamento, por favor permtanos de 1 a 2 das hbiles para completar este proceso.  Los precios de los medicamentos varan con  frecuencia dependiendo del lugar de dnde se surte la receta y alguna farmacias pueden ofrecer precios ms baratos.  El sitio web www.goodrx.com tiene cupones para medicamentos de diferentes farmacias. Los precios aqu no tienen en cuenta lo que podra costar con la ayuda del seguro (puede ser ms barato con su seguro), pero el sitio web puede darle el precio si no utiliz ningn seguro.  - Puede imprimir el cupn correspondiente y llevarlo con su receta a la farmacia.  - Tambin puede pasar por nuestra oficina durante el horario de atencin regular y recoger una tarjeta de cupones de GoodRx.  - Si necesita que su receta se enve electrnicamente a una farmacia diferente, informe a nuestra oficina a travs de MyChart de [facility] o por telfono llamando al [phone] y presione la opcin 4.  

## 2022-03-16 NOTE — Progress Notes (Signed)
Follow-Up Visit   Subjective  Michelle Ruiz is a 51 y.o. female who presents for the following: TBSE (HxBCC. Patient does have a spot at right hand and a few spots at face to check. ). She has a bump behind her left ear that she picks at.  The patient presents for Total-Body Skin Exam (TBSE) for skin cancer screening and mole check.  The patient has spots, moles and lesions to be evaluated, some may be new or changing and the patient has concerns that these could be cancer.   The following portions of the chart were reviewed this encounter and updated as appropriate:       Review of Systems:  No other skin or systemic complaints except as noted in HPI or Assessment and Plan.  Objective  Well appearing patient in no apparent distress; mood and affect are within normal limits.  A full examination was performed including scalp, head, eyes, ears, nose, lips, neck, chest, axillae, abdomen, back, buttocks, bilateral upper extremities, bilateral lower extremities, hands, feet, fingers, toes, fingernails, and toenails. All findings within normal limits unless otherwise noted below.  right nasal dorsum 3 mm small yellow white lobulated papule, not growing or bleeding per pt     right nasal tip Light violaceous slightly depressed macule at right nasal tip 4 mm     left mid back, left shoulder, right medial knee, left lateral heel 5 x 3 mm medium brown macule at left mid back 4 mm brown macule with darker center at left shoulder 2.27m med dark brown macule at right medial knee 4 mm speckled brown macule at left lateral heel  left post upper neck x 1 Erythematous stuck-on, waxy papule  R hand dorsum x 1 2 mm pink keratotic papule    Assessment & Plan  Sebaceous hyperplasia right nasal dorsum  Benign-appearing.  Observation.  Call clinic for new or changing lesions.  Recommend daily use of broad spectrum spf 30+ sunscreen to sun-exposed areas.    Acne vulgaris right nasal  tip  Resolved with superficial scar  Pt has recent h/o of pink bump that came up and lasted about a week before improving  Benign, observe.   Continue Duac gel qd to face   Related Medications Clindamycin-Benzoyl Per, Refr, (DUAC) gel Spot treat areas on face once dialy  Nevus left mid back, left shoulder, right medial knee, left lateral heel  Benign-appearing. Stable compared to previous visit. Observation.  Call clinic for new or changing moles.  Recommend daily use of broad spectrum spf 30+ sunscreen to sun-exposed areas.    Inflamed seborrheic keratosis left post upper neck x 1  Symptomatic, irritating, patient would like treated.  Recheck on f/up  Destruction of lesion - left post upper neck x 1  Destruction method: cryotherapy   Informed consent: discussed and consent obtained   Lesion destroyed using liquid nitrogen: Yes   Region frozen until ice ball extended beyond lesion: Yes   Outcome: patient tolerated procedure well with no complications   Post-procedure details: wound care instructions given   Additional details:  Prior to procedure, discussed risks of blister formation, small wound, skin dyspigmentation, or rare scar following cryotherapy. Recommend Vaseline ointment to treated areas while healing.   Viral warts, unspecified type R hand dorsum x 1  Viral Wart (HPV) Counseling  Discussed viral / HPV (Human Papilloma Virus) etiology and risk of spread /infectivity to other areas of body as well as to other people.  Multiple treatments and  methods may be required to clear warts and it is possible treatment may not be successful.  Treatment risks include discoloration; scarring and there is still potential for wart recurrence.   Destruction of lesion - R hand dorsum x 1  Destruction method: cryotherapy   Informed consent: discussed and consent obtained   Lesion destroyed using liquid nitrogen: Yes   Region frozen until ice ball extended beyond lesion: Yes    Outcome: patient tolerated procedure well with no complications   Post-procedure details: wound care instructions given   Additional details:  Prior to procedure, discussed risks of blister formation, small wound, skin dyspigmentation, or rare scar following cryotherapy. Recommend Vaseline ointment to treated areas while healing.    History of Basal Cell Carcinoma of the Skin - No evidence of recurrence today - Recommend regular full body skin exams - Recommend daily broad spectrum sunscreen SPF 30+ to sun-exposed areas, reapply every 2 hours as needed.  - Call if any new or changing lesions are noted between office visits  Lentigines - Scattered tan macules - Due to sun exposure - Benign-appearing, observe - Recommend daily broad spectrum sunscreen SPF 30+ to sun-exposed areas, reapply every 2 hours as needed. - Call for any changes  Seborrheic Keratoses - Stuck-on, waxy, tan-brown papules and/or plaques  - Benign-appearing - Discussed benign etiology and prognosis. - Observe - Call for any changes  Melanocytic Nevi - Tan-brown and/or pink-flesh-colored symmetric macules and papules - Benign appearing on exam today - Observation - Call clinic for new or changing moles - Recommend daily use of broad spectrum spf 30+ sunscreen to sun-exposed areas.   Hemangiomas - Red papules - Discussed benign nature - Observe - Call for any changes  Actinic Damage - Chronic condition, secondary to cumulative UV/sun exposure - diffuse scaly erythematous macules with underlying dyspigmentation - Recommend daily broad spectrum sunscreen SPF 30+ to sun-exposed areas, reapply every 2 hours as needed.  - Staying in the shade or wearing long sleeves, sun glasses (UVA+UVB protection) and wide brim hats (4-inch brim around the entire circumference of the hat) are also recommended for sun protection.  - Call for new or changing lesions.  Skin cancer screening performed today.  Actinic Damage  with PreCancerous Actinic Keratoses Counseling for Topical Chemotherapy Management: Patient exhibits: - Severe, confluent actinic changes with pre-cancerous actinic keratoses that is secondary to cumulative UV radiation exposure over time- chest - Condition that is severe; chronic, not at goal. - diffuse scaly erythematous macules and papules with underlying dyspigmentation - Discussed Prescription "Field Treatment" topical Chemotherapy for Severe, Chronic Confluent Actinic Changes with Pre-Cancerous Actinic Keratoses Field treatment involves treatment of an entire area of skin that has confluent Actinic Changes (Sun/ Ultraviolet light damage) and PreCancerous Actinic Keratoses by method of PhotoDynamic Therapy (PDT) and/or prescription Topical Chemotherapy agents such as 5-fluorouracil, 5-fluorouracil/calcipotriene, and/or imiquimod.  The purpose is to decrease the number of clinically evident and subclinical PreCancerous lesions to prevent progression to development of skin cancer by chemically destroying early precancer changes that may or may not be visible.  It has been shown to reduce the risk of developing skin cancer in the treated area. As a result of treatment, redness, scaling, crusting, and open sores may occur during treatment course. One or more than one of these methods may be used and may have to be used several times to control, suppress and eliminate the PreCancerous changes. Discussed treatment course, expected reaction, and possible side effects. - Recommend daily broad spectrum sunscreen  SPF 30+ to sun-exposed areas, reapply every 2 hours as needed.  - Staying in the shade or wearing long sleeves, sun glasses (UVA+UVB protection) and wide brim hats (4-inch brim around the entire circumference of the hat) are also recommended. - Call for new or changing lesions. - with debridement   Return for PDT to chest with debridement, 6 month TBSE, Hx BCC, AK follow up.  Graciella Belton,  RMA, am acting as scribe for Brendolyn Patty, MD .  Documentation: I have reviewed the above documentation for accuracy and completeness, and I agree with the above.  Brendolyn Patty MD

## 2022-03-22 ENCOUNTER — Ambulatory Visit: Payer: BC Managed Care – PPO

## 2022-03-22 DIAGNOSIS — M6281 Muscle weakness (generalized): Secondary | ICD-10-CM

## 2022-03-22 DIAGNOSIS — R262 Difficulty in walking, not elsewhere classified: Secondary | ICD-10-CM

## 2022-03-22 NOTE — Therapy (Signed)
OUTPATIENT PHYSICAL THERAPY TREATMENT NOTE Reporting Period 02/22/22 - 05/17/2022   Patient Name: Michelle Ruiz MRN: [id number] DOB:[birth year], 51 y.o., female Today's Date: 11/01/2021  PCP: Halford Chessman PROVIDER: [employer]   PT End of Session - 11/01/21 1528     Visit Number 32    Number of Visits 34    Date for PT Re-Evaluation 02/21/22    Authorization Type BCBS COMM Pro    Authorization Time Period 06/15/21-09/07/21    Authorization - Visit Number 2   Progress Note Due on Visit 10    PT Start Time 1430   PT Stop Time 1512   PT Time Calculation (min) 42 min    Equipment Utilized During Treatment Gait belt    Activity Tolerance Patient tolerated treatment well;Patient limited by fatigue    Behavior During Therapy WFL for tasks assessed/performed                Past Medical History:  Diagnosis Date   Abnormal Pap smear of cervix    Actinic keratosis    Anxiety    Basal cell carcinoma 04/14/2008   Right upper ant. thigh. BCC with sclerosis.   Basal cell carcinoma 08/14/2008   Right lat. lower thigh. Superficial.    Basal cell carcinoma 04/29/2019   Right mid forearm. Superficial and nodular patterns. EDC.   Basal cell carcinoma 08/17/2020   Right upper antecubital. EDC.   Basal cell carcinoma 08/23/2021   Left Lower Leg, EDc   Kidney stones    MS (multiple sclerosis) ([place])    UTI (urinary tract infection)    Past Surgical History:  Procedure Laterality Date   BREAST BIOPSY Right 2010   stereotactic biopsy/ neg/ dr.byrnett   BREAST BIOPSY Left 2012   neg/dr. brynett   CESAREAN SECTION  2008   [place]   TONSILLECTOMY  2006   There are no problems to display for this patient.   REFERRING DIAG: MS  THERAPY DIAG:  Difficulty in walking, not elsewhere classified  Rationale for Evaluation and Treatment Rehabilitation  PERTINENT HISTORY: Patient reports history of falls x3 in the past 6 months secondary to drop foot on the L LE. PMH: Multiple  sclerosis; elbow fracture of the R UE  PRECAUTIONS: fall  SUBJECTIVE:  03/22/22: Pt reports PT is very helpful for helping her manage MS symptoms.  Her legs feel a little fatigued today.    PAIN:  Are you having pain? No       TODAY'S TREATMENT:  There.ex:  03/22/22:  Recumbent bike: 7.5 min total (Level 4 x 5 min, level 5 x 1 min, level 6 x 1 min, level 7 x 30 sec)  Alt lunge onto bosu 2x 12 with unilateral UE support   Monster walks fwd and back 2x 67f GTB with supervision for safety  Lateral band walk GTB (at ankles) 2x15 ft, supervision for safety  SL RDL cone taps with unilateral hand support 2x 10 (occasional PT L knee "block" technique for stability)  Total gym level 25 height: leg press unilateral R x 15, L x10 (intermittent R toe support concentrically), x10 bilateral    Seated rest breaks provided between exercises for recovery b/t exercises (two minutes between exercises)         PATIENT EDUCATION: Education details: therex form/technique Person educated: Patient Education method: EConsulting civil engineer Demonstration, and Verbal cues Education comprehension: verbalized understanding, returned demonstration, and verbal cues required     HOME EXERCISE PROGRAM: Walking program  Clinical Impression:  03/22/22 PT was appropriately challenged with LE strengthening and dynamic balance activities during today's session.  She had no major loss of balance during session, but did require CGA during single leg RDL exercise on LLE for safety due to L LE weakness.  Patient will continue to benefit from skilled physical therapy to improve fatigue resistance and LLE strength for increased independence with ADL and to decrease fall risk.      PT Long Term Goals - 09/05/21 1545       PT LONG TERM GOAL #1   Title Patient will be independent with balance HEP to maximize safety in ADL, IADL, and fitness activity    Baseline walking only; 09/05/21 is walking for  exercise    Time 4    Period Weeks    Status Achieved      PT LONG TERM GOAL #2   Title Pt to demonstrate improvement in minibesTest >3 points to demonstrate improved posture control and reduced falls risk.    Baseline 09/05/21 20/28    Time 5    Period Weeks    Status New    Target Date 07/20/21      PT LONG TERM GOAL #3   Title Pt to perform SLS >15 sec bilat to indicate improved single limb motor control in gait.    Baseline 02/25/2019: 14sec (retest deferred to visit 2); 09/05/21 R 20sec; L 7sec    Time 6    Period Weeks    Status On-going      PT LONG TERM GOAL #4   Title Pt will improve 6 MWT >1513f to demonstrate improved cardiorespiratory fitness    Baseline 06/15/21: 11066f(gait belt, no AD);  09/05/21: 120038fgait belt, no AD)    Time 7    Period Weeks    Status On-going    Target Date 11/25/21      PT LONG TERM GOAL #5   Title Pt to demonstrate 30sec chair rise >13 to indicate improved power in BLE.    Baseline 09/05/21 9    Time 8    Period Weeks    Status New            Patient Details  Name: Michelle CARDINN: [id number] of Birth: 8/1Jan 11, 1972ferring Provider:  ShaVladimir CroftsD  Encounter Date: 03/22/2022  RacMerdis DelayT, DPT, OCS  #[id number] BadderT 03/22/2022, 3:29 PM  [facility] [address],[place]one: [phone]:  [phone]

## 2022-03-29 ENCOUNTER — Ambulatory Visit: Payer: No Typology Code available for payment source | Attending: Neurology | Admitting: Physical Therapy

## 2022-03-29 ENCOUNTER — Encounter: Payer: Self-pay | Admitting: Physical Therapy

## 2022-03-29 DIAGNOSIS — M6281 Muscle weakness (generalized): Secondary | ICD-10-CM | POA: Diagnosis present

## 2022-03-29 DIAGNOSIS — Z9181 History of falling: Secondary | ICD-10-CM | POA: Diagnosis present

## 2022-03-29 DIAGNOSIS — R262 Difficulty in walking, not elsewhere classified: Secondary | ICD-10-CM | POA: Diagnosis present

## 2022-03-29 NOTE — Therapy (Addendum)
OUTPATIENT PHYSICAL THERAPY TREATMENT NOTE Reporting Period 02/22/22 - 05/17/2022   Patient Name: Michelle Ruiz MRN: [id number] DOB:[birth year], 52 y.o., female Today's Date: 11/01/2021  PCP: Halford Chessman PROVIDER: [employer]   PT End of Session - 11/01/21 1528     Visit Number 32    Number of Visits 34    Date for PT Re-Evaluation 02/21/22    Authorization Type BCBS COMM Pro    Authorization Time Period 06/15/21-09/07/21    Authorization - Visit Number 2   Progress Note Due on Visit 10    PT Start Time 1430   PT Stop Time 1512   PT Time Calculation (min) 42 min    Equipment Utilized During Treatment Gait belt    Activity Tolerance Patient tolerated treatment well;Patient limited by fatigue    Behavior During Therapy WFL for tasks assessed/performed                Past Medical History:  Diagnosis Date   Abnormal Pap smear of cervix    Actinic keratosis    Anxiety    Basal cell carcinoma 04/14/2008   Right upper ant. thigh. BCC with sclerosis.   Basal cell carcinoma 08/14/2008   Right lat. lower thigh. Superficial.    Basal cell carcinoma 04/29/2019   Right mid forearm. Superficial and nodular patterns. EDC.   Basal cell carcinoma 08/17/2020   Right upper antecubital. EDC.   Basal cell carcinoma 08/23/2021   Left Lower Leg, EDc   Kidney stones    MS (multiple sclerosis) ([place])    UTI (urinary tract infection)    Past Surgical History:  Procedure Laterality Date   BREAST BIOPSY Right 2010   stereotactic biopsy/ neg/ dr.byrnett   BREAST BIOPSY Left 2012   neg/dr. brynett   CESAREAN SECTION  2008   [place]   TONSILLECTOMY  2006   There are no problems to display for this patient.   REFERRING DIAG: MS  THERAPY DIAG:  Difficulty in walking, not elsewhere classified  Rationale for Evaluation and Treatment Rehabilitation  PERTINENT HISTORY: Patient reports history of falls x3 in the past 6 months secondary to drop foot on the L LE. PMH: Multiple  sclerosis; elbow fracture of the R UE  PRECAUTIONS: fall  SUBJECTIVE:  03/22/22: Pt reports PT is very helpful for helping her manage MS symptoms.  Her legs feel a little fatigued today.    PAIN:  Are you having pain? No       TODAY'S TREATMENT:  There.ex:  Recumbent bike: 7.371mnutes L5 223m; L8 71m7m L5 1mi16mL9 1min11m6 1min;25m0 30sec  Deadmills fwd 30sec; bwd 30sec  Deadmills fwd 30sec; bwd 30sec   Stool hamstring curl scoot 30ft S61f push knee ext scoot 30ft St102fhamstring curl scoot 30ft Sto72fush knee ext scoot 30ft  Alt83fr shoulder med ball throws 15# 2x 12 with turns to pick up    Seated rest breaks provided between exercises for recovery b/t exercises (two minutes between exercises)         PATIENT EDUCATION: Education details: therex form/technique Person educated: Patient Education method: ExplanatioConsulting civil engineeration, and Verbal cues Education comprehension: verbalized understanding, returned demonstration, and verbal cues required     HOME EXERCISE PROGRAM: Walking program                Clinical Impression:  Pt continued therex progression for increased activity tolerance, dynamic balance, and general strengthening with success. Pt is able to comply with all cuing  for proper technique of therex with good effort throughout session. PT will continue progression as able.      PT Long Term Goals - 09/05/21 1545       PT LONG TERM GOAL #1   Title Patient will be independent with balance HEP to maximize safety in ADL, IADL, and fitness activity    Baseline walking only; 09/05/21 is walking for exercise    Time 4    Period Weeks    Status Achieved      PT LONG TERM GOAL #2   Title Pt to demonstrate improvement in minibesTest >3 points to demonstrate improved posture control and reduced falls risk.    Baseline 09/05/21 20/28    Time 5    Period Weeks    Status New    Target Date 07/20/21      PT LONG TERM GOAL #3   Title Pt to perform SLS >15  sec bilat to indicate improved single limb motor control in gait.    Baseline 02/25/2019: 14sec (retest deferred to visit 2); 09/05/21 R 20sec; L 7sec    Time 6    Period Weeks    Status On-going      PT LONG TERM GOAL #4   Title Pt will improve 6 MWT >1530f to demonstrate improved cardiorespiratory fitness    Baseline 06/15/21: 11055f(gait belt, no AD);  09/05/21: 12004fgait belt, no AD)    Time 7    Period Weeks    Status On-going    Target Date 11/25/21      PT LONG TERM GOAL #5   Title Pt to demonstrate 30sec chair rise >13 to indicate improved power in BLE.    Baseline 09/05/21 9    Time 8    Period Weeks    Status New            Patient Details  Name: KriARRYN TERRONESN: [id number] of Birth: 8/1September 03, 1972ferring Provider:  ShaVladimir CroftsD  Encounter Date: 03/29/2022  [name] [name] 03/29/2022, 3:59 PM  [facility] [address],[place]one: [phone]:  [phone]

## 2022-04-04 ENCOUNTER — Ambulatory Visit: Payer: No Typology Code available for payment source | Admitting: Physical Therapy

## 2022-04-11 ENCOUNTER — Ambulatory Visit: Payer: BC Managed Care – PPO | Admitting: Obstetrics and Gynecology

## 2022-04-11 ENCOUNTER — Encounter: Payer: Self-pay | Admitting: Physical Therapy

## 2022-04-11 ENCOUNTER — Ambulatory Visit: Payer: No Typology Code available for payment source | Admitting: Physical Therapy

## 2022-04-11 DIAGNOSIS — R262 Difficulty in walking, not elsewhere classified: Secondary | ICD-10-CM | POA: Diagnosis not present

## 2022-04-11 DIAGNOSIS — M6281 Muscle weakness (generalized): Secondary | ICD-10-CM

## 2022-04-11 NOTE — Therapy (Signed)
OUTPATIENT PHYSICAL THERAPY TREATMENT NOTE    Patient Name: Michelle Ruiz MRN: [id number] DOB:18-Jun-1970, 52 y.o., female Today's Date: 11/01/2021  PCP: Halford Chessman PROVIDER: [employer]   PT End of Session - 11/01/21 1528     Visit Number 32    Number of Visits 34    Date for PT Re-Evaluation 02/21/22    Authorization Type BCBS COMM Pro    Authorization Time Period 06/15/21-09/07/21    Authorization - Visit Number 2   Progress Note Due on Visit 10    PT Start Time 1430   PT Stop Time 1512   PT Time Calculation (min) 42 min    Equipment Utilized During Treatment Gait belt    Activity Tolerance Patient tolerated treatment well;Patient limited by fatigue    Behavior During Therapy WFL for tasks assessed/performed                Past Medical History:  Diagnosis Date   Abnormal Pap smear of cervix    Actinic keratosis    Anxiety    Basal cell carcinoma 04/14/2008   Right upper ant. thigh. BCC with sclerosis.   Basal cell carcinoma 08/14/2008   Right lat. lower thigh. Superficial.    Basal cell carcinoma 04/29/2019   Right mid forearm. Superficial and nodular patterns. EDC.   Basal cell carcinoma 08/17/2020   Right upper antecubital. EDC.   Basal cell carcinoma 08/23/2021   Left Lower Leg, EDc   Kidney stones    MS (multiple sclerosis) ([place])    UTI (urinary tract infection)    Past Surgical History:  Procedure Laterality Date   BREAST BIOPSY Right 2010   stereotactic biopsy/ neg/ dr.byrnett   BREAST BIOPSY Left 2012   neg/dr. brynett   CESAREAN SECTION  2008   [place]   TONSILLECTOMY  2006   There are no problems to display for this patient.   REFERRING DIAG: MS  THERAPY DIAG:  Difficulty in walking, not elsewhere classified  Rationale for Evaluation and Treatment Rehabilitation  PERTINENT HISTORY: Patient reports history of falls x3 in the past 6 months secondary to drop foot on the L LE. PMH: Multiple sclerosis; elbow fracture of the R  UE  PRECAUTIONS: fall  SUBJECTIVE:  04/11/22 Pt rpeorts doing well overall, enjoying walking in the cold. Reports she had a tough time with the later [place], but feels better now.   PAIN:  Are you having pain? No       TODAY'S TREATMENT:  There.ex:  Recumbent bike: 7.37mnutes L5 221m; L8 5m60m L5 1mi4mL9 1min27m6 1min;80m0 30sec  Stool hamstring curl scoot 30ft S32f push knee ext scoot 30ft St74fhamstring curl scoot 30ft Sto41fush knee ext scoot 30ft  Alt61fr shoulder med ball throws 15# 2x 12 with turns to pick up    Standing on one foot with CLLE toe touch round the world 20# KB x12 Squat figure 8 passes 20# KB x12   Standing on one foot with CLLE toe touch round the world 20# KB x12 Squat figure 8 passes 20# KB x12  Fwd deadmill 45sec    Seated rest breaks provided between exercises for recovery b/t exercises (two minutes between exercises)         PATIENT EDUCATION: Education details: therex form/technique Person educated: Patient Education method: ExplanatioConsulting civil engineeration, and Verbal cues Education comprehension: verbalized understanding, returned demonstration, and verbal cues required     HOME EXERCISE PROGRAM: Walking program  Clinical Impression:  Pt continued therex progression for increased activity tolerance, dynamic balance, and general strengthening with success. Pt is able to comply with all cuing for proper technique of therex with good effort throughout session. PT will continue progression as able.      PT Long Term Goals - 09/05/21 1545       PT LONG TERM GOAL #1   Title Patient will be independent with balance HEP to maximize safety in ADL, IADL, and fitness activity    Baseline walking only; 09/05/21 is walking for exercise    Time 4    Period Weeks    Status Achieved      PT LONG TERM GOAL #2   Title Pt to demonstrate improvement in minibesTest >3 points to demonstrate improved posture control and reduced falls  risk.    Baseline 09/05/21 20/28    Time 5    Period Weeks    Status New    Target Date 07/20/21      PT LONG TERM GOAL #3   Title Pt to perform SLS >15 sec bilat to indicate improved single limb motor control in gait.    Baseline 02/25/2019: 14sec (retest deferred to visit 2); 09/05/21 R 20sec; L 7sec    Time 6    Period Weeks    Status On-going      PT LONG TERM GOAL #4   Title Pt will improve 6 MWT >1569f to demonstrate improved cardiorespiratory fitness    Baseline 06/15/21: 11020f(gait belt, no AD);  09/05/21: 120046fgait belt, no AD)    Time 7    Period Weeks    Status On-going    Target Date 11/25/21      PT LONG TERM GOAL #5   Title Pt to demonstrate 30sec chair rise >13 to indicate improved power in BLE.    Baseline 09/05/21 9    Time 8    Period Weeks    Status New            Patient Details  Name: Michelle CRESWELLN: [id number] of Birth: 8/111-30-1972ferring Provider:  ShaVladimir CroftsD  Encounter Date: 04/11/2022  [name] [name] 04/12/2022, 10:13 AM  [facility] [address],[place]one: [phone]:  [phone]

## 2022-04-18 ENCOUNTER — Ambulatory Visit: Payer: No Typology Code available for payment source | Admitting: Physical Therapy

## 2022-04-18 DIAGNOSIS — M6281 Muscle weakness (generalized): Secondary | ICD-10-CM

## 2022-04-18 DIAGNOSIS — Z9181 History of falling: Secondary | ICD-10-CM

## 2022-04-18 DIAGNOSIS — R262 Difficulty in walking, not elsewhere classified: Secondary | ICD-10-CM

## 2022-04-18 NOTE — Therapy (Signed)
OUTPATIENT PHYSICAL THERAPY TREATMENT NOTE    Patient Name: Michelle Ruiz MRN: [id number] DOB:1971/02/11, 52 y.o., female Today's Date: 11/01/2021  PCP: Halford Chessman PROVIDER: Jennings Books   PT End of Session - 11/01/21 1528     Visit Number 32    Number of Visits 34    Date for PT Re-Evaluation 02/21/22    Authorization Type BCBS COMM Pro    Authorization Time Period 06/15/21-09/07/21    Authorization - Visit Number 2   Progress Note Due on Visit 10    PT Start Time 1430   PT Stop Time 1512   PT Time Calculation (min) 42 min    Equipment Utilized During Treatment Gait belt    Activity Tolerance Patient tolerated treatment well;Patient limited by fatigue    Behavior During Therapy WFL for tasks assessed/performed                Past Medical History:  Diagnosis Date   Abnormal Pap smear of cervix    Actinic keratosis    Anxiety    Basal cell carcinoma 04/14/2008   Right upper ant. thigh. BCC with sclerosis.   Basal cell carcinoma 08/14/2008   Right lat. lower thigh. Superficial.    Basal cell carcinoma 04/29/2019   Right mid forearm. Superficial and nodular patterns. EDC.   Basal cell carcinoma 08/17/2020   Right upper antecubital. EDC.   Basal cell carcinoma 08/23/2021   Left Lower Leg, EDc   Kidney stones    MS (multiple sclerosis) ([place])    UTI (urinary tract infection)    Past Surgical History:  Procedure Laterality Date   BREAST BIOPSY Right 2010   stereotactic biopsy/ neg/ dr.byrnett   BREAST BIOPSY Left 2012   neg/dr. brynett   CESAREAN SECTION  2008   [place]   TONSILLECTOMY  2006   There are no problems to display for this patient.   REFERRING DIAG: MS  THERAPY DIAG:  Difficulty in walking, not elsewhere classified  Rationale for Evaluation and Treatment Rehabilitation  PERTINENT HISTORY: Patient reports history of falls x3 in the past 6 months secondary to drop foot on the L LE. PMH: Multiple sclerosis; elbow fracture of the R  UE  PRECAUTIONS: fall  SUBJECTIVE:  04/18/22 Pt rpeorts doing "good" since the last session. Pt reports she had 26 performance reviews to get through so she's feeling "a little bit fatigued". No falls since last session.  PAIN:  Are you having pain? No       TODAY'S TREATMENT: 04/18/22 There-ex: Recumbent bike: 5 mins L6 45 secs; L2 1 min  Alt over shoulder med ball throws 15# 2x 12 with turns to pick up    Hip hinge with 20# KB 2x x 10   DL ball toss with foam pad to fatigue   DL ball shoot into hoop with foam pad to fatigue   Soccer kicks for time (2 mins.)  Seated rest breaks provided between exercises for recovery b/t exercises (two minutes between exercises)    PATIENT EDUCATION: Education details: therex form/technique Person educated: Patient Education method: Consulting civil engineer, Demonstration, and Verbal cues Education comprehension: verbalized understanding, returned demonstration, and verbal cues required     HOME EXERCISE PROGRAM: Walking program                Clinical Impression:  Pt continued therex progression for increased activity tolerance, dynamic balance, and general strengthening with success. Pt is able to comply with all cuing for proper technique of  therex with good effort throughout session. PT will continue progression as able.      PT Long Term Goals - 09/05/21 1545       PT LONG TERM GOAL #1   Title Patient will be independent with balance HEP to maximize safety in ADL, IADL, and fitness activity    Baseline walking only; 09/05/21 is walking for exercise    Time 4    Period Weeks    Status Achieved      PT LONG TERM GOAL #2   Title Pt to demonstrate improvement in minibesTest >3 points to demonstrate improved posture control and reduced falls risk.    Baseline 09/05/21 20/28    Time 5    Period Weeks    Status New    Target Date 07/20/21      PT LONG TERM GOAL #3   Title Pt to perform SLS >15 sec bilat to indicate improved single limb  motor control in gait.    Baseline 02/25/2019: 14sec (retest deferred to visit 2); 09/05/21 R 20sec; L 7sec    Time 6    Period Weeks    Status On-going      PT LONG TERM GOAL #4   Title Pt will improve 6 MWT >1531f to demonstrate improved cardiorespiratory fitness    Baseline 06/15/21: 11034f(gait belt, no AD);  09/05/21: 120062fgait belt, no AD)    Time 7    Period Weeks    Status On-going    Target Date 11/25/21      PT LONG TERM GOAL #5   Title Pt to demonstrate 30sec chair rise >13 to indicate improved power in BLE.    Baseline 09/05/21 9    Time 8    Period Weeks    Status New            Patient Details  Name: Michelle Ruiz: [id number] of Birth: [birth year]ferring Provider:  ShaVladimir CroftsD  Encounter Date: 04/18/2022  [name] TayStanford Scotlandtudent-PT 04/20/2022, 2:29 PM  [facility],[place]one: [phone]:  [phone]

## 2022-04-20 ENCOUNTER — Encounter: Payer: Self-pay | Admitting: Physical Therapy

## 2022-04-25 ENCOUNTER — Ambulatory Visit: Payer: No Typology Code available for payment source | Admitting: Physical Therapy

## 2022-04-25 DIAGNOSIS — R262 Difficulty in walking, not elsewhere classified: Secondary | ICD-10-CM

## 2022-04-25 NOTE — Therapy (Incomplete)
OUTPATIENT PHYSICAL THERAPY TREATMENT NOTE    Patient Name: Michelle Ruiz MRN: [id number] DOB:04-07-1970, 52 y.o., female Today's Date: 11/01/2021  PCP: Michelle Ruiz PROVIDER: Jennings Ruiz   PT End of Session - 11/01/21 1528     Visit Number 32    Number of Visits 34    Date for PT Re-Evaluation 02/21/22    Authorization Type BCBS COMM Pro    Authorization Time Period 06/15/21-09/07/21    Authorization - Visit Number 2   Progress Note Due on Visit 10    PT Start Time 1430   PT Stop Time 1512   PT Time Calculation (min) 42 min    Equipment Utilized During Treatment Gait belt    Activity Tolerance Patient tolerated treatment well;Patient limited by fatigue    Behavior During Therapy WFL for tasks assessed/performed                Past Medical History:  Diagnosis Date   Abnormal Pap smear of cervix    Actinic keratosis    Anxiety    Basal cell carcinoma 04/14/2008   Right upper ant. thigh. BCC with sclerosis.   Basal cell carcinoma 08/14/2008   Right lat. lower thigh. Superficial.    Basal cell carcinoma 04/29/2019   Right mid forearm. Superficial and nodular patterns. EDC.   Basal cell carcinoma 08/17/2020   Right upper antecubital. EDC.   Basal cell carcinoma 08/23/2021   Left Lower Leg, EDc   Kidney stones    MS (multiple sclerosis) ([place])    UTI (urinary tract infection)    Past Surgical History:  Procedure Laterality Date   BREAST BIOPSY Right 2010   stereotactic biopsy/ neg/ dr.byrnett   BREAST BIOPSY Left 2012   neg/dr. brynett   CESAREAN SECTION  2008   [place]   TONSILLECTOMY  2006   There are no problems to display for this patient.   REFERRING DIAG: MS  THERAPY DIAG:  Difficulty in walking, not elsewhere classified  Rationale for Evaluation and Treatment Rehabilitation  PERTINENT HISTORY: Patient reports history of falls x3 in the past 6 months secondary to drop foot on the L LE. PMH: Multiple sclerosis; elbow fracture of the R  UE  PRECAUTIONS: fall  SUBJECTIVE:  04/18/22 Pt rpeorts doing "good" since the last session. Pt reports she had 26 performance reviews to get through so she's feeling "a little bit fatigued". No falls since last session.  PAIN:  Are you having pain? No       TODAY'S TREATMENT: 04/18/22 There-ex: Recumbent bike: 7.75mnutes L5 259m; L8 44m57m L5 1mi5mL9 1min66m6 1min;54m0 30sec   Wood chops 2x 12 bilat 8# with min cuing for proper technique with good carry over  Soccer ball kick from oncoming kick 2x 12 bilat     Alt SL hip hinge to cone touch 6in con on 6in step 2x 12  Seated rest breaks provided between exercises for recovery b/t exercises (two minutes between exercises)    PATIENT EDUCATION: Education details: therex form/technique Person educated: Patient Education method: ExplanConsulting civil engineernstration, and Verbal cues Education comprehension: verbalized understanding, returned demonstration, and verbal cues required     HOME EXERCISE PROGRAM: Walking program                Clinical Impression:  Pt continued therex progression for increased activity tolerance, dynamic balance, and general strengthening with success. Pt is able to comply with all cuing for proper technique of therex with good effort throughout  session. PT will continue progression as able.      PT Long Term Goals - 09/05/21 1545       PT LONG TERM GOAL #1   Title Patient will be independent with balance HEP to maximize safety in ADL, IADL, and fitness activity    Baseline walking only; 09/05/21 is walking for exercise    Time 4    Period Weeks    Status Achieved      PT LONG TERM GOAL #2   Title Pt to demonstrate improvement in minibesTest >3 points to demonstrate improved posture control and reduced falls risk.    Baseline 09/05/21 20/28    Time 5    Period Weeks    Status New    Target Date 07/20/21      PT LONG TERM GOAL #3   Title Pt to perform SLS >15 sec bilat to indicate improved single  limb motor control in gait.    Baseline 02/25/2019: 14sec (retest deferred to visit 2); 09/05/21 R 20sec; L 7sec    Time 6    Period Weeks    Status On-going      PT LONG TERM GOAL #4   Title Pt will improve 6 MWT >158f to demonstrate improved cardiorespiratory fitness    Baseline 06/15/21: 11057f(gait belt, no AD);  09/05/21: 12002fgait belt, no AD)    Time 7    Period Weeks    Status On-going    Target Date 11/25/21      PT LONG TERM GOAL #5   Title Pt to demonstrate 30sec chair rise >13 to indicate improved power in BLE.    Baseline 09/05/21 9    Time 8    Period Weeks    Status New            Patient Details  Name: Michelle HESCHN: [id number] of Birth: [birth year]ferring Provider:  ShaVladimir Ruiz  Encounter Date: 04/25/2022  Michelle Ruiz Michelle Ruiz 04/25/2022, 3:56 PM  [facility],[place]one: [phone]:  [phone]

## 2022-04-27 ENCOUNTER — Encounter: Payer: Self-pay | Admitting: Physical Therapy

## 2022-04-27 NOTE — Therapy (Signed)
OUTPATIENT PHYSICAL THERAPY TREATMENT NOTE   Patient Name: Michelle Ruiz MRN: [id number] DOB:October 28, 1970, 52 y.o., female Today's Date: 04/27/2022  PCP: Tressia Miners MD REFERRING PROVIDER: Manuella Ghazi MD  END OF SESSION:  PT End of Session - 04/27/22 0819     Visit Number 41    Number of Visits 80    Date for PT Re-Evaluation 05/17/22    Authorization Type BCBS COMM Pro    Authorization Time Period 06/15/21-09/07/21    Authorization - Visit Number 1    Progress Note Due on Visit 10    PT Start Time 4098    PT Stop Time 1355    PT Time Calculation (min) 40 min    Equipment Utilized During Treatment Gait belt    Activity Tolerance Patient tolerated treatment well    Behavior During Therapy WFL for tasks assessed/performed             Past Medical History:  Diagnosis Date   Abnormal Pap smear of cervix    Actinic keratosis    Anxiety    Basal cell carcinoma 04/14/2008   Right upper ant. thigh. BCC with sclerosis.   Basal cell carcinoma 08/14/2008   Right lat. lower thigh. Superficial.    Basal cell carcinoma 04/29/2019   Right mid forearm. Superficial and nodular patterns. EDC.   Basal cell carcinoma 08/17/2020   Right upper antecubital. EDC.   Basal cell carcinoma 08/23/2021   Left Lower Leg, EDc   Kidney stones    MS (multiple sclerosis) ([place])    UTI (urinary tract infection)    Past Surgical History:  Procedure Laterality Date   BREAST BIOPSY Right 2010   stereotactic biopsy/ neg/ dr.byrnett   BREAST BIOPSY Left 2012   neg/dr. brynett   CESAREAN SECTION  2008   [place]   TONSILLECTOMY  2006   There are no problems to display for this patient.   REFERRING DIAG: Unsteadiness; difficulty walking  THERAPY DIAG:  Difficulty in walking, not elsewhere classified  Rationale for Evaluation and Treatment Rehabilitation  PERTINENT HISTORY:  Patient reports history of falls x3 in the past 6 months secondary to drop foot on the L LE. PMH: Multiple sclerosis; elbow fracture  of the R UE   PRECAUTIONS: falls  SUBJECTIVE:   SUBJECTIVE STATEMENT: Pt reports doing well since last visit, no falls. Attempting to increase walking distance in daily walks  PAIN:  Are you having pain? No   TODAY'S TREATMENT:                                                                                                                                         DATE: 04/24/22 There-ex: Recumbent bike: 7.75mnutes L5 2564m; L8 64m75m L5 1mi47mL9 1min84m6 1min;54m0 30sec   Wood chops 2x 12 bilat 8# with min cuing for proper technique with good carry over  Soccer ball kick from oncoming kick  2x 12 bilat     Alt SL hip hinge to cone touch 6in con on 6in step 2x 12  Seated rest breaks provided between exercises for recovery b/t exercises (two minutes between exercises)   PATIENT EDUCATION: Education details: Patient was educated on diagnosis, anatomy and pathology involved, prognosis, role of PT, and was given an HEP, demonstrating exercise with proper form following verbal and tactile cues, and was given a paper hand out to continue exercise at home. Pt was educated on and agreed to plan of care.  Person educated: Patient Education method: Explanation, Demonstration, and Verbal cues Education comprehension: verbalized understanding and returned demonstration  HOME EXERCISE PROGRAM: Walking program  Clinical Impression: PT continued therex progression for increased static and dynamic balance, and generalized conditioning. Patient is able to comply with all cuing for proper technique of all therex with good effort throughout session. Patient with appropriate rest breaks needed throughout for condition. PT will continue progression as able.    PT Long Term Goals - 11/29/21 1519       PT LONG TERM GOAL #1   Title Patient will be independent with balance HEP to maximize safety in ADL, IADL, and fitness activity    Baseline walking only; 09/05/21 is walking for exercise    Time 4     Period Weeks    Status Achieved    Target Date 07/13/21      PT LONG TERM GOAL #2   Title Pt to demonstrate improvement in minibesTest >3 points to demonstrate improved posture control and reduced falls risk.    Baseline 09/05/21 20/28; 11/29/21 20/28    Time 5    Period Weeks    Status New      PT LONG TERM GOAL #3   Title Pt to perform SLS >15 sec bilat to indicate improved single limb motor control in gait.    Baseline 02/25/2019: 14sec (retest deferred to visit 2); 09/05/21 R 20sec; L 7sec    Time 6    Period Weeks    Status Revised      PT LONG TERM GOAL #4   Title Pt will improve 6 MWT >1541f to demonstrate improved cardiorespiratory fitness    Baseline 06/15/21: 11065f(gait belt, no AD);  09/05/21: 120083fgait belt, no AD)    Time 7    Period Weeks    Status Deferred      PT LONG TERM GOAL #5   Title Pt to demonstrate 30sec chair rise >13 to indicate improved power in BLE.    Baseline 09/05/21 9; 11/29/21 10.5    Time 8    Period Weeks    Status On-going      PT LONG TERM GOAL  #11   TITLE Pt will demonstrate L SLS of 21 sec in order to demonstrate symmetrical static balance of unaffected LE to decrease fall risk    Baseline 02/15/21 R 21 sec; L 7.73; 11/29/21 R 22sec  L 8sec    Time 8    Period Weeks    Status On-going                 CheJaneT 04/27/2022, 8:26 AM

## 2022-04-30 NOTE — Progress Notes (Unsigned)
No chief complaint on file.    HPI:      [name] is a 51 y.o. G2P1011 who LMP was No LMP recorded., presents today for her annual examination. Her menses are regular every 25 days, lasting 4 days. Dysmenorrhea none. She does not have intermenstrual bleeding.   Sex activity: single partner, contraception - vasectomy.  Last Pap: 10/11/18  Results were: no abnormalities /neg HPV DNA  Hx of STDs: none   Last mammogram: 09/16/21  Results were: normal--routine follow-up in 12 months There is no FH of breast cancer. There is no FH of ovarian cancer. FH endometrial cancer in her mother; doesn't meet genetic testing guidelines. The patient does not do self-breast exams.   Tobacco use: The patient denies current or previous tobacco use. Alcohol use: none No drug use Exercise: moderately active  Coloboscopy: never; Neg Cologuard 4/23; repeat due after 3 yrs   She does get adequate calcium and Vitamin D in her diet.   Her UTI sx from 3/19 and 4/19 have resolved with cipro. Taking cipro for 2 wks this time due to recurrent sx. Hx of MS with bladder laxity.   Past Medical History:  Diagnosis Date   Abnormal Pap smear of cervix    Actinic keratosis    Anxiety    Basal cell carcinoma 04/14/2008   Right upper ant. thigh. BCC with sclerosis.   Basal cell carcinoma 08/14/2008   Right lat. lower thigh. Superficial.    Basal cell carcinoma 04/29/2019   Right mid forearm. Superficial and nodular patterns. EDC.   Basal cell carcinoma 08/17/2020   Right upper antecubital. EDC.   Basal cell carcinoma 08/23/2021   Left Lower Leg, EDc   Kidney stones    MS (multiple sclerosis) ([facility])    UTI (urinary tract infection)     Past Surgical History:  Procedure Laterality Date   BREAST BIOPSY Right 2010   stereotactic biopsy/ neg/ dr.byrnett   BREAST BIOPSY Left 2012   neg/dr. brynett   CESAREAN SECTION  2008   [place]   TONSILLECTOMY  2006    Family History  Problem Relation Age of  Onset   Endometrial cancer Mother 65   Multiple sclerosis Sister 64   Cancer Sister 62       appendix   Autism Son        Infantile   Diabetes type II Other        Aunt   Bladder Cancer Neg Hx    Kidney cancer Neg Hx    Breast cancer Neg Hx     Social History   Socioeconomic History   Marital status: Married    Spouse name: Not on file   Number of children: Not on file   Years of education: Not on file   Highest education level: Not on file  Occupational History   Not on file  Tobacco Use   Smoking status: Former    Packs/day: 2.00    Years: 25.00    Total pack years: 50.00    Types: Cigarettes    Quit date: 2013    Years since quitting: 11.0   Smokeless tobacco: Never  Vaping Use   Vaping Use: Never used  Substance and Sexual Activity   Alcohol use: Not Currently    Alcohol/week: 4.0 standard drinks of alcohol    Types: 4 Glasses of wine per week   Drug use: No   Sexual activity: Yes    Birth control/protection: Surgical  Comment: Vasectomy  Other Topics Concern   Not on file  Social History Narrative   Not on file   Social Determinants of Health   Financial Resource Strain: Not on file  Food Insecurity: Not on file  Transportation Needs: Not on file  Physical Activity: Not on file  Stress: Not on file  Social Connections: Not on file  Intimate Partner Violence: Not on file    Current Outpatient Medications on File Prior to Visit  Medication Sig Dispense Refill   Ascorbic Acid (VITAMIN C) 1000 MG tablet Take 1,000 mg by mouth daily.     Azelaic Acid 15 % gel Apply 1 application topically 2 (two) times daily. After skin is thoroughly washed and patted dry, gently but thoroughly massage a thin film of azelaic acid cream into the affected area twice daily, in the morning and evening. 50 g 5   baclofen (LIORESAL) 10 MG tablet 10 mg 2 (two) times daily.     buPROPion (WELLBUTRIN SR) 100 MG 12 hr tablet TAKE 1 TABLET BY MOUTH TWICE DAILY AT 8AM AND 3PM  1    Cholecalciferol 50 MCG (2000 UT) CAPS Take 2,000 Units by mouth.     ciprofloxacin (CIPRO) 500 MG tablet Take 1 tablet (500 mg total) by mouth daily as needed. Daily for 3 days then as needed once after intercourse. 30 tablet 3   Clindamycin-Benzoyl Per, Refr, (DUAC) gel Spot treat areas on face once dialy 45 g 3   clonazePAM (KLONOPIN) 0.5 MG tablet TAKE 1 TABLET BY MOUTH EVERY DAY AS NEEDED **STOP XANAX**  3   cyanocobalamin 2000 MCG tablet Take 2,000 mcg by mouth daily.     dalfampridine 10 MG TB12 Take 10 mg by mouth 2 (two) times daily.     Dimethyl Fumarate 240 MG CPDR 240 mg 1 day or 1 dose.     hydrocortisone 2.5 % cream Apply topically 2 (two) times daily as needed (Rash). Apply to affected areas of right ear lobe and fingers. 30 g 1   sertraline (ZOLOFT) 50 MG tablet Take 100 mg by mouth.     No current facility-administered medications on file prior to visit.      ROS:  Review of Systems  Constitutional:  Negative for fatigue, fever and unexpected weight change.  Respiratory:  Negative for cough, shortness of breath and wheezing.   Cardiovascular:  Negative for chest pain, palpitations and leg swelling.  Gastrointestinal:  Negative for blood in stool, constipation, diarrhea, nausea and vomiting.  Endocrine: Negative for cold intolerance, heat intolerance and polyuria.  Genitourinary:  Negative for dyspareunia, dysuria, flank pain, frequency, genital sores, hematuria, menstrual problem, pelvic pain, urgency, vaginal bleeding, vaginal discharge and vaginal pain.  Musculoskeletal:  Negative for back pain, joint swelling and myalgias.  Skin:  Negative for rash.  Neurological:  Negative for dizziness, syncope, light-headedness, numbness and headaches.  Hematological:  Negative for adenopathy.  Psychiatric/Behavioral:  Negative for agitation, confusion, sleep disturbance and suicidal ideas. The patient is not nervous/anxious.      Objective: There were no vitals taken for  this visit.   Physical Exam Constitutional:      Appearance: She is well-developed.  Genitourinary:     No vaginal discharge, erythema or tenderness.      Right Adnexa: not tender and no mass present.    Left Adnexa: not tender and no mass present.    No cervical motion tenderness or polyp.     Uterus is not enlarged or tender.  Breasts:    Right: No mass, nipple discharge, skin change or tenderness.     Left: No mass, nipple discharge, skin change or tenderness.  Neck:     Thyroid: No thyromegaly.  Cardiovascular:     Rate and Rhythm: Normal rate and regular rhythm.     Heart sounds: Normal heart sounds. No murmur heard. Pulmonary:     Effort: Pulmonary effort is normal.     Breath sounds: Normal breath sounds.  Abdominal:     Palpations: Abdomen is soft.     Tenderness: There is no abdominal tenderness. There is no guarding.  Musculoskeletal:        General: Normal range of motion.     Cervical back: Normal range of motion.  Neurological:     Mental Status: She is alert and oriented to person, place, and time.     Cranial Nerves: No cranial nerve deficit.  Psychiatric:        Behavior: Behavior normal.  Vitals reviewed.    Assessment/Plan: No diagnosis found.     GYN counsel breast self exam, mammography screening, adequate intake of calcium and vitamin D, diet and exercise     F/U  No follow-ups on file.  Fredia Chittenden B. Terra Aveni, PA-C 04/30/2022 6:43 PM

## 2022-05-01 ENCOUNTER — Ambulatory Visit (INDEPENDENT_AMBULATORY_CARE_PROVIDER_SITE_OTHER): Payer: No Typology Code available for payment source | Admitting: Obstetrics and Gynecology

## 2022-05-01 ENCOUNTER — Other Ambulatory Visit (HOSPITAL_COMMUNITY)
Admission: RE | Admit: 2022-05-01 | Discharge: 2022-05-01 | Disposition: A | Discharge status: Home / Self Care | Payer: No Typology Code available for payment source | Source: Ambulatory Visit | Attending: Obstetrics and Gynecology | Admitting: Obstetrics and Gynecology

## 2022-05-01 ENCOUNTER — Encounter: Payer: Self-pay | Admitting: Obstetrics and Gynecology

## 2022-05-01 VITALS — BP 124/80 | Ht 69.0 in | Wt 202.0 lb

## 2022-05-01 DIAGNOSIS — Z01419 Encounter for gynecological examination (general) (routine) without abnormal findings: Secondary | ICD-10-CM | POA: Diagnosis not present

## 2022-05-01 DIAGNOSIS — N951 Menopausal and female climacteric states: Secondary | ICD-10-CM | POA: Diagnosis not present

## 2022-05-01 DIAGNOSIS — Z124 Encounter for screening for malignant neoplasm of cervix: Secondary | ICD-10-CM

## 2022-05-01 DIAGNOSIS — Z1151 Encounter for screening for human papillomavirus (HPV): Secondary | ICD-10-CM | POA: Diagnosis present

## 2022-05-01 DIAGNOSIS — Z1231 Encounter for screening mammogram for malignant neoplasm of breast: Secondary | ICD-10-CM

## 2022-05-01 NOTE — Patient Instructions (Signed)
I value your feedback and you entrusting us with your care. If you get a [facility] patient survey, I would appreciate you taking the time to let us know about your experience today. Thank you!  [facility]: [phone]      

## 2022-05-02 ENCOUNTER — Ambulatory Visit: Payer: No Typology Code available for payment source | Admitting: Physical Therapy

## 2022-05-03 LAB — CYTOLOGY - PAP
Adequacy: ABSENT
Comment: NEGATIVE
Diagnosis: NEGATIVE
High risk HPV: NEGATIVE

## 2022-05-05 ENCOUNTER — Encounter: Payer: Self-pay | Admitting: Physical Therapy

## 2022-05-05 ENCOUNTER — Ambulatory Visit: Payer: No Typology Code available for payment source | Attending: Neurology | Admitting: Physical Therapy

## 2022-05-05 DIAGNOSIS — M6281 Muscle weakness (generalized): Secondary | ICD-10-CM | POA: Insufficient documentation

## 2022-05-05 DIAGNOSIS — R262 Difficulty in walking, not elsewhere classified: Secondary | ICD-10-CM | POA: Insufficient documentation

## 2022-05-05 NOTE — Therapy (Signed)
OUTPATIENT PHYSICAL THERAPY TREATMENT NOTE   Patient Name: Michelle Ruiz MRN: [id number] DOB:10-30-1970, 52 y.o., female Today's Date: 05/05/2022  PCP: Tressia Miners MD REFERRING PROVIDER: Manuella Ghazi MD  END OF SESSION:  PT End of Session - 05/05/22 0834     Visit Number 42    Number of Visits 80    Date for PT Re-Evaluation 05/17/22    Authorization Type [employer] COMM [employer]    Authorization Time Period 06/15/21-09/07/21    Authorization - Visit Number 2    Progress Note Due on Visit 10    PT Start Time 0830    PT Stop Time 0910    PT Time Calculation (min) 40 min    Activity Tolerance Patient tolerated treatment well    Behavior During Therapy [facility] for tasks assessed/performed              Past Medical History:  Diagnosis Date   Abnormal Pap smear of cervix    Actinic keratosis    Anxiety    Basal cell carcinoma 04/14/2008   Right upper ant. thigh. BCC with sclerosis.   Basal cell carcinoma 08/14/2008   Right lat. lower thigh. Superficial.    Basal cell carcinoma 04/29/2019   Right mid forearm. Superficial and nodular patterns. EDC.   Basal cell carcinoma 08/17/2020   Right upper antecubital. EDC.   Basal cell carcinoma 08/23/2021   Left Lower Leg, EDc   Kidney stones    MS (multiple sclerosis) ([place])    UTI (urinary tract infection)    Past Surgical History:  Procedure Laterality Date   BREAST BIOPSY Right 2010   stereotactic biopsy/ neg/ dr.byrnett   BREAST BIOPSY Left 2012   neg/dr. brynett   CESAREAN SECTION  2008   [place]   TONSILLECTOMY  2006   There are no problems to display for this patient.   REFERRING DIAG: Unsteadiness; difficulty walking  THERAPY DIAG:  Difficulty in walking, not elsewhere classified  Muscle weakness (generalized)  Rationale for Evaluation and Treatment Rehabilitation  PERTINENT HISTORY:  Patient reports history of falls x3 in the past 6 months secondary to drop foot on the L LE. PMH: Multiple sclerosis; elbow fracture of the R UE    PRECAUTIONS: falls  SUBJECTIVE:   SUBJECTIVE STATEMENT: Pt reports difficulty with L foot clearance while walking her daily walks at home, feeling like this is going to make her fall.   PAIN:  Are you having pain? No   TODAY'S TREATMENT:                                                                                                                                         DATE: 04/24/22 There-ex: Recumbent bike: 7.5mnutes L5 220m; L8 51m58m L5 1mi14mL9 1min3m6 1min;71m0 30sec   Standing hip flex with ankle DF with 5# AW on midfoot 2x 12  Alt 6in cone heel taps with  cone on foam pad with unilateral UE support 2x12 with min cuing to prevent "drag" off cone  Duck walks 2x 58f with difficulty maintaining height at end of walk way   Alt step ups onto 12in step x12 with cuing to prevent circumduction compensation for LLE raise, and to prevent L foot "drag" off step with decent carry over   PATIENT EDUCATION: Education details: Patient was educated on diagnosis, anatomy and pathology involved, prognosis, role of PT, and was given an HEP, demonstrating exercise with proper form following verbal and tactile cues, and was given a paper hand out to continue exercise at home. Pt was educated on and agreed to plan of care.  Person educated: Patient Education method: Explanation, Demonstration, and Verbal cues Education comprehension: verbalized understanding and returned demonstration  HOME EXERCISE PROGRAM: Walking program  Clinical Impression: PT continued therex progression for increased static and dynamic balance, and generalized conditioning.  PT focused on active hip flex and DF for foot clearance needed to provide LLE clearance through gait, and prevent fall risk.  Patient is able to comply with all cuing for proper technique of all therex with good effort throughout session. Patient with appropriate rest breaks needed throughout for condition. PT will continue progression as able.     PT Long Term Goals - 11/29/21 1519       PT LONG TERM GOAL #1   Title Patient will be independent with balance HEP to maximize safety in ADL, IADL, and fitness activity    Baseline walking only; 09/05/21 is walking for exercise    Time 4    Period Weeks    Status Achieved    Target Date 07/13/21      PT LONG TERM GOAL #2   Title Pt to demonstrate improvement in minibesTest >3 points to demonstrate improved posture control and reduced falls risk.    Baseline 09/05/21 20/28; 11/29/21 20/28    Time 5    Period Weeks    Status New      PT LONG TERM GOAL #3   Title Pt to perform SLS >15 sec bilat to indicate improved single limb motor control in gait.    Baseline 02/25/2019: 14sec (retest deferred to visit 2); 09/05/21 R 20sec; L 7sec    Time 6    Period Weeks    Status Revised      PT LONG TERM GOAL #4   Title Pt will improve 6 MWT >15057fto demonstrate improved cardiorespiratory fitness    Baseline 06/15/21: 110069fgait belt, no AD);  09/05/21: 1200f22fait belt, no AD)    Time 7    Period Weeks    Status Deferred      PT LONG TERM GOAL #5   Title Pt to demonstrate 30sec chair rise >13 to indicate improved power in BLE.    Baseline 09/05/21 9; 11/29/21 10.5    Time 8    Period Weeks    Status On-going      PT LONG TERM GOAL  #11   TITLE Pt will demonstrate L SLS of 21 sec in order to demonstrate symmetrical static balance of unaffected LE to decrease fall risk    Baseline 02/15/21 R 21 sec; L 7.73; 11/29/21 R 22sec  L 8sec    Time 8    Period Weeks    Status On-going               [employer]  ChelDurwin Reges 05/05/2022, 9:47 AM

## 2022-05-09 ENCOUNTER — Encounter: Payer: Self-pay | Admitting: Physical Therapy

## 2022-05-09 ENCOUNTER — Ambulatory Visit: Payer: No Typology Code available for payment source | Admitting: Physical Therapy

## 2022-05-09 DIAGNOSIS — R262 Difficulty in walking, not elsewhere classified: Secondary | ICD-10-CM | POA: Diagnosis not present

## 2022-05-09 DIAGNOSIS — M6281 Muscle weakness (generalized): Secondary | ICD-10-CM

## 2022-05-09 NOTE — Therapy (Signed)
OUTPATIENT PHYSICAL THERAPY TREATMENT NOTE   Patient Name: Michelle Ruiz MRN: [id number] DOB:04/12/1970, 52 y.o., female Today's Date: 05/10/2022  PCP: Tressia Miners MD REFERRING PROVIDER: Manuella Ghazi MD  END OF SESSION:  PT End of Session - 05/09/22 1607     Visit Number 43    Number of Visits 80    Date for PT Re-Evaluation 05/17/22    PT Start Time [id number]    PT Stop Time 1600    PT Time Calculation (min) 45 min    Activity Tolerance Patient tolerated treatment well    Behavior During Therapy [facility] for tasks assessed/performed               Past Medical History:  Diagnosis Date   Abnormal Pap smear of cervix    Actinic keratosis    Anxiety    Basal cell carcinoma 04/14/2008   Right upper ant. thigh. BCC with sclerosis.   Basal cell carcinoma 08/14/2008   Right lat. lower thigh. Superficial.    Basal cell carcinoma 04/29/2019   Right mid forearm. Superficial and nodular patterns. EDC.   Basal cell carcinoma 08/17/2020   Right upper antecubital. EDC.   Basal cell carcinoma 08/23/2021   Left Lower Leg, EDc   Kidney stones    MS (multiple sclerosis) ([place])    UTI (urinary tract infection)    Past Surgical History:  Procedure Laterality Date   BREAST BIOPSY Right 2010   stereotactic biopsy/ neg/ dr.byrnett   BREAST BIOPSY Left 2012   neg/dr. brynett   CESAREAN SECTION  2008   [place]   TONSILLECTOMY  2006   There are no problems to display for this patient.   REFERRING DIAG: Unsteadiness; difficulty walking  THERAPY DIAG:  Difficulty in walking, not elsewhere classified  Muscle weakness (generalized)  Rationale for Evaluation and Treatment Rehabilitation  PERTINENT HISTORY:  Patient reports history of falls x3 in the past 6 months secondary to drop foot on the L LE. PMH: Multiple sclerosis; elbow fracture of the R UE   PRECAUTIONS: falls  SUBJECTIVE:   SUBJECTIVE STATEMENT: Pt reports she still feels "worn out" from last session but "good" overall. She's still  been having difficulty with L foot clearance while walking at home.   PAIN:  Are you having pain? No   TODAY'S TREATMENT:                                                                                                                                         DATE: 05/09/22 Recumbent bike: 7.4mnutes L5 218m; L8 16m77m L5 1mi57mL9 1min816m6 1min;416m0 30sec Hip thrusts 20# x 12 Side stepping with BTB for 44 ft 15# med ball throws over shoulder to squat x 10 (bilaterally) Alt step ups onto 12in step x 10 (bilaterally)   04/24/22 There-ex: Recumbent bike: 7.5minut15mL5 16min; L73mmin; L529min; L9 84mn; L6 175m; L10 353mc  Standing hip flex with ankle DF with 5# AW on midfoot 2x 12  Alt 6in cone heel taps with cone on foam pad with unilateral UE support 2x12 with min cuing to prevent "drag" off cone  Duck walks 2x 61f with difficulty maintaining height at end of walk way  Alt step ups onto 12in step x12 with cuing to prevent circumduction compensation for LLE raise, and to prevent L foot "drag" off step with decent carry over   PATIENT EDUCATION: Education details: Patient was educated on diagnosis, anatomy and pathology involved, prognosis, role of PT, and was given an HEP, demonstrating exercise with proper form following verbal and tactile cues, and was given a paper hand out to continue exercise at home. Pt was educated on and agreed to plan of care.  Person educated: Patient Education method: Explanation, Demonstration, and Verbal cues Education comprehension: verbalized understanding and returned demonstration  HOME EXERCISE PROGRAM: Walking program  Clinical Impression: PT continued therex progression for increased static and dynamic balance, and generalized conditioning.  PT focused on active hip flex and DF for foot clearance needed to provide LLE clearance through gait, and prevent fall risk.  Pt is able to comply with all cuing for proper form and technique of all therex with  excellent effort throughout session. Pt with appropriate rest breaks needed throughout for condition. PT will continue progression as able. Pt would continue to benefit from skilled PT to address aforementioned deficits to improve QoL.   PT Long Term Goals - 11/29/21 1519       PT LONG TERM GOAL #1   Title Patient will be independent with balance HEP to maximize safety in ADL, IADL, and fitness activity    Baseline walking only; 09/05/21 is walking for exercise    Time 4    Period Weeks    Status Achieved    Target Date 07/13/21      PT LONG TERM GOAL #2   Title Pt to demonstrate improvement in minibesTest >3 points to demonstrate improved posture control and reduced falls risk.    Baseline 09/05/21 20/28; 11/29/21 20/28    Time 5    Period Weeks    Status New      PT LONG TERM GOAL #3   Title Pt to perform SLS >15 sec bilat to indicate improved single limb motor control in gait.    Baseline 02/25/2019: 14sec (retest deferred to visit 2); 09/05/21 R 20sec; L 7sec    Time 6    Period Weeks    Status Revised      PT LONG TERM GOAL #4   Title Pt will improve 6 MWT >15025fto demonstrate improved cardiorespiratory fitness    Baseline 06/15/21: 110023fgait belt, no AD);  09/05/21: 1200f81fait belt, no AD)    Time 7    Period Weeks    Status Deferred      PT LONG TERM GOAL #5   Title Pt to demonstrate 30sec chair rise >13 to indicate improved power in BLE.    Baseline 09/05/21 9; 11/29/21 10.5    Time 8    Period Weeks    Status On-going      PT LONG TERM GOAL  #11   TITLE Pt will demonstrate L SLS of 21 sec in order to demonstrate symmetrical static balance of unaffected LE to decrease fall risk    Baseline 02/15/21 R 21 sec; L 7.73; 11/29/21 R 22sec  L 8sec    Time 8  Period Weeks    Status On-going               Durwin Reges DPT  Durwin Reges, PT 05/10/2022, 12:59 PM

## 2022-05-10 ENCOUNTER — Encounter: Payer: Self-pay | Admitting: Physical Therapy

## 2022-05-11 ENCOUNTER — Ambulatory Visit: Payer: BC Managed Care – PPO

## 2022-05-16 ENCOUNTER — Encounter: Payer: Self-pay | Admitting: Physical Therapy

## 2022-05-16 ENCOUNTER — Ambulatory Visit: Payer: No Typology Code available for payment source

## 2022-05-16 DIAGNOSIS — R262 Difficulty in walking, not elsewhere classified: Secondary | ICD-10-CM

## 2022-05-16 NOTE — Therapy (Unsigned)
OUTPATIENT PHYSICAL THERAPY TREATMENT NOTE   Patient Name: Michelle Ruiz MRN: [id number] DOB:[birth year], 52 y.o., female Today's Date: 05/17/2022  PCP: Tressia Miners MD REFERRING PROVIDER: Manuella Ghazi MD  END OF SESSION:  PT End of Session - 05/16/22 1513     Visit Number 44    Number of Visits 80    Date for PT Re-Evaluation 05/17/22    PT Start Time [id number]    PT Stop Time 1600    PT Time Calculation (min) 45 min    Activity Tolerance Patient tolerated treatment well    Behavior During Therapy [facility] for tasks assessed/performed                Past Medical History:  Diagnosis Date   Abnormal Pap smear of cervix    Actinic keratosis    Anxiety    Basal cell carcinoma 04/14/2008   Right upper ant. thigh. BCC with sclerosis.   Basal cell carcinoma 08/14/2008   Right lat. lower thigh. Superficial.    Basal cell carcinoma 04/29/2019   Right mid forearm. Superficial and nodular patterns. EDC.   Basal cell carcinoma 08/17/2020   Right upper antecubital. EDC.   Basal cell carcinoma 08/23/2021   Left Lower Leg, EDc   Kidney stones    MS (multiple sclerosis) ([place])    UTI (urinary tract infection)    Past Surgical History:  Procedure Laterality Date   BREAST BIOPSY Right 2010   stereotactic biopsy/ neg/ dr.byrnett   BREAST BIOPSY Left 2012   neg/dr. brynett   CESAREAN SECTION  2008   [place]   TONSILLECTOMY  2006   There are no problems to display for this patient.   REFERRING DIAG: Unsteadiness; difficulty walking  THERAPY DIAG:  Difficulty in walking, not elsewhere classified  Rationale for Evaluation and Treatment Rehabilitation  PERTINENT HISTORY:  Patient reports history of falls x3 in the past 6 months secondary to drop foot on the L LE. PMH: Multiple sclerosis; elbow fracture of the R UE   PRECAUTIONS: falls  SUBJECTIVE:   SUBJECTIVE STATEMENT:  Pt reports she feels "pretty good" overall. Pt experienced prolonged soreness and she speculates it was from hip  thrust therex. She states she went on a 25-minute walk with her 52 y.o. Restaurant manager, fast food (who is able to walk faster than her) and she felt fatigued near the end of it. She uses cane for walks.   PAIN:  Are you having pain? No   TODAY'S TREATMENT:                                                                                                                                         DATE: 05/16/22 Therex: Recumbent bike: 7.53mnutes L5 2280m; L8 80m67m L5 1mi34mL9 1min110m6 1min;71m0 30sec Hip thrusts 2x 20# x 12 Duck walks x 30ft w30fgood carry over Rebounder with modified tandem balance (LLE behind) yellow  ball toss 2x x 12 CGA Standing march on LLE 4# AW x 12     05/09/22 Recumbent bike: 7.662mnutes L5 277m; L8 62m67m L5 1mi68mL9 1min31m6 1min;55m0 30sec Hip thrusts 20# x 12 Side stepping with BTB for 44 ft 15# med ball throws over shoulder to squat x 10 (bilaterally) Alt step ups onto 12in step x 10 (bilaterally)   04/24/22 There-ex: Recumbent bike: 7.5minut105mL5 62min; L33mmin; L537min; L9 46mn; L6 124m; L10 311mc   Standing hip flex with ankle DF with 5# AW on midfoot 2x 12  Alt 6in cone heel taps with cone on foam pad with unilateral UE support 2x12 with min cuing to prevent "drag" off cone  Duck walks 2x 30ft with di27fulty maintaining height at end of walk way  Alt step ups onto 12in step x12 with cuing to prevent circumduction compensation for LLE raise, and to prevent L foot "drag" off step with decent carry over   PATIENT EDUCATION: Education details: Patient was educated on diagnosis, anatomy and pathology involved, prognosis, role of PT, and was given an HEP, demonstrating exercise with proper form following verbal and tactile cues, and was given a paper hand out to continue exercise at home. Pt was educated on and agreed to plan of care.  Person educated: Patient Education method: Explanation, Demonstration, and Verbal cues Education comprehension: verbalized  understanding and returned demonstration  HOME EXERCISE PROGRAM: Walking program  Clinical Impression: PT continued therex progression for increased static and dynamic balance, and generalized conditioning.  PT focused on active hip flex and DF for foot clearance needed to provide LLE clearance through gait, and prevent fall risk. Pt responded well to static/dynamic stabilization therex. Pt is able to comply with all cuing for proper form and technique of all therex with excellent effort throughout session. PT issued appropriate rest breaks throughout the session for condition. PT will continue progression as able. Pt would continue to benefit from skilled PT to address aforementioned deficits to improve QoL.   PT Long Term Goals - 11/29/21 1519       PT LONG TERM GOAL #1   Title Patient will be independent with balance HEP to maximize safety in ADL, IADL, and fitness activity    Baseline walking only; 09/05/21 is walking for exercise    Time 4    Period Weeks    Status Achieved    Target Date 07/13/21      PT LONG TERM GOAL #2   Title Pt to demonstrate improvement in minibesTest >3 points to demonstrate improved posture control and reduced falls risk.    Baseline 09/05/21 20/28; 11/29/21 20/28    Time 5    Period Weeks    Status New      PT LONG TERM GOAL #3   Title Pt to perform SLS >15 sec bilat to indicate improved single limb motor control in gait.    Baseline 02/25/2019: 14sec (retest deferred to visit 2); 09/05/21 R 20sec; L 7sec    Time 6    Period Weeks    Status Revised      PT LONG TERM GOAL #4   Title Pt will improve 6 MWT >1500ft to demon22fte improved cardiorespiratory fitness    Baseline 06/15/21: 1100ft (gait bel57fo AD);  09/05/21: 1200ft (gait belt48f AD)    Time 7    Period Weeks    Status Deferred      PT LONG TERM GOAL #5   Title Pt  to demonstrate 30sec chair rise >13 to indicate improved power in BLE.    Baseline 09/05/21 9; 11/29/21 10.5    Time 8    Period  Weeks    Status On-going      PT LONG TERM GOAL  #11   TITLE Pt will demonstrate L SLS of 21 sec in order to demonstrate symmetrical static balance of unaffected LE to decrease fall risk    Baseline 02/15/21 R 21 sec; L 7.73; 11/29/21 R 22sec  L 8sec    Time 8    Period Weeks    Status On-going              Stanford Scotland, SPT  [place]. Fairly IV, PT, DPT Physical Therapist- [facility]  05/17/2022, 8:05 AM

## 2022-05-23 ENCOUNTER — Encounter: Payer: Self-pay | Admitting: Physical Therapy

## 2022-05-23 ENCOUNTER — Ambulatory Visit: Payer: No Typology Code available for payment source | Admitting: Physical Therapy

## 2022-05-23 DIAGNOSIS — M6281 Muscle weakness (generalized): Secondary | ICD-10-CM

## 2022-05-23 DIAGNOSIS — R262 Difficulty in walking, not elsewhere classified: Secondary | ICD-10-CM

## 2022-05-23 NOTE — Therapy (Signed)
OUTPATIENT PHYSICAL THERAPY TREATMENT NOTE   Patient Name: Michelle Ruiz MRN: [id number] DOB:Oct 03, 1970, 52 y.o., female Today's Date: 05/23/2022  PCP: Tressia Miners MD REFERRING PROVIDER: Manuella Ghazi MD  END OF SESSION:  PT End of Session - 05/23/22 1517     Visit Number 45    Number of Visits 80    Date for PT Re-Evaluation 05/17/22    PT Start Time [id number]    PT Stop Time 1600    PT Time Calculation (min) 43 min    Activity Tolerance Patient tolerated treatment well    Behavior During Therapy [facility] for tasks assessed/performed                 Past Medical History:  Diagnosis Date   Abnormal Pap smear of cervix    Actinic keratosis    Anxiety    Basal cell carcinoma 04/14/2008   Right upper ant. thigh. BCC with sclerosis.   Basal cell carcinoma 08/14/2008   Right lat. lower thigh. Superficial.    Basal cell carcinoma 04/29/2019   Right mid forearm. Superficial and nodular patterns. EDC.   Basal cell carcinoma 08/17/2020   Right upper antecubital. EDC.   Basal cell carcinoma 08/23/2021   Left Lower Leg, EDc   Kidney stones    MS (multiple sclerosis) ([place])    UTI (urinary tract infection)    Past Surgical History:  Procedure Laterality Date   BREAST BIOPSY Right 2010   stereotactic biopsy/ neg/ dr.byrnett   BREAST BIOPSY Left 2012   neg/dr. brynett   CESAREAN SECTION  2008   [place]   TONSILLECTOMY  2006   There are no problems to display for this patient.   REFERRING DIAG: Unsteadiness; difficulty walking  THERAPY DIAG:  Difficulty in walking, not elsewhere classified  Muscle weakness (generalized)  Rationale for Evaluation and Treatment Rehabilitation  PERTINENT HISTORY:  Patient reports history of falls x3 in the past 6 months secondary to drop foot on the L LE. PMH: Multiple sclerosis; elbow fracture of the R UE   PRECAUTIONS: falls  SUBJECTIVE:   SUBJECTIVE STATEMENT:  Pt reports she feels "really good" overall. Pt states she went on a 25-minute walk  with her 52 y.o. Restaurant manager, fast food and she noticed she felt less fatigued near the end of it. She uses cane for walks.   PAIN:  Are you having pain? No   TODAY'S TREATMENT:                                                                                                                                         DATE: 05/23/22 Therex: - Recumbent bike: 7.74mnutes L5 281m; L8 46m83m L5 1mi49mL9 1min47m6 1min;38m0 30sec - Rebounder with modified tandem balance (LLE behind) yellow ball toss 2x x 12 CGA - Duck walks x 30ft w7fgood carry over - 15# med ball throws over shoulder to squat x  10 (bilaterally) - Alt step ups onto 12in step x 10 (bilaterally)  Last session: 05/16/22 Therex: Recumbent bike: 7.67mnutes L5 2655m; L8 62m47m L5 55mi5mL9 55min155m6 55min;96m0 30sec Hip thrusts 2x 20# x 12 Duck walks x 30ft w29fgood carry over Rebounder with modified tandem balance (LLE behind) yellow ball toss 2x x 12 CGA Standing march on LLE 4# AW x 12     05/09/22 Recumbent bike: 7.5minute65m5 62min; L813min; L5 35mn; L9 119m; L6 55m26m L10 3055m Hip thrusts 20# x 12 Side stepping with BTB for 44 ft 15# med ball throws over shoulder to squat x 10 (bilaterally) Alt step ups onto 12in step x 10 (bilaterally)   04/24/22 There-ex: Recumbent bike: 7.5minutes L5 283m; L8 62min;66m 55min; 59m55min; L262mmin; L144m0sec   46mnding hip flex with ankle DF with 5# AW on midfoot 2x 12  Alt 6in cone heel taps with cone on foam pad with unilateral UE support 2x12 with min cuing to prevent "drag" off cone  Duck walks 2x 30ft with difficult40fintaining height at end of walk way  Alt step ups onto 12in step x12 with cuing to prevent circumduction compensation for LLE raise, and to prevent L foot "drag" off step with decent carry over   PATIENT EDUCATION: Education details: Patient was educated on diagnosis, anatomy and pathology involved, prognosis, role of PT, and was given an HEP, demonstrating exercise with proper  form following verbal and tactile cues, and was given a paper hand out to continue exercise at home. Pt was educated on and agreed to plan of care.  Person educated: Patient Education method: Explanation, Demonstration, and Verbal cues Education comprehension: verbalized understanding and returned demonstration  HOME EXERCISE PROGRAM: Walking program  Clinical Impression: PT continued therex progression for increased static and dynamic balance, and generalized conditioning.  PT focused on active hip flex and DF for foot clearance needed to provide LLE clearance through gait, and prevent fall risk. Pt responded well to static/dynamic stabilization therex. Pt is able to comply with all cuing for proper form and technique of all therex with excellent effort throughout session. PT issued appropriate rest breaks throughout the session for condition. PT will continue progression as able. Pt would continue to benefit from skilled PT to address aforementioned deficits to improve QoL.   PT Long Term Goals - 11/29/21 1519       PT LONG TERM GOAL #1   Title Patient will be independent with balance HEP to maximize safety in ADL, IADL, and fitness activity    Baseline walking only; 09/05/21 is walking for exercise    Time 4    Period Weeks    Status Achieved    Target Date 07/13/21      PT LONG TERM GOAL #2   Title Pt to demonstrate improvement in minibesTest >3 points to demonstrate improved posture control and reduced falls risk.    Baseline 09/05/21 20/28; 11/29/21 20/28    Time 5    Period Weeks    Status New      PT LONG TERM GOAL #3   Title Pt to perform SLS >15 sec bilat to indicate improved single limb motor control in gait.    Baseline 02/25/2019: 14sec (retest deferred to visit 2); 09/05/21 R 20sec; L 7sec    Time 6    Period Weeks    Status Revised      PT LONG TERM GOAL #4   Title Pt will improve  6 MWT >1585f to demonstrate improved cardiorespiratory fitness    Baseline 06/15/21:  11081f(gait belt, no AD);  09/05/21: 120053fgait belt, no AD)    Time 7    Period Weeks    Status Deferred      PT LONG TERM GOAL #5   Title Pt to demonstrate 30sec chair rise >13 to indicate improved power in BLE.    Baseline 09/05/21 9; 11/29/21 10.5    Time 8    Period Weeks    Status On-going      PT LONG TERM GOAL  #11   TITLE Pt will demonstrate L SLS of 21 sec in order to demonstrate symmetrical static balance of unaffected LE to decrease fall risk    Baseline 02/15/21 R 21 sec; L 7.73; 11/29/21 R 22sec  L 8sec    Time 8    Period Weeks    Status On-going              TayStanford ScotlandPT  MilSchallerairly IV, PT, DPT Physical Therapist- [facility]/27/2024, 3:18 PM

## 2022-05-30 ENCOUNTER — Ambulatory Visit: Payer: No Typology Code available for payment source | Attending: Neurology | Admitting: Physical Therapy

## 2022-05-30 ENCOUNTER — Encounter: Payer: Self-pay | Admitting: Physical Therapy

## 2022-05-30 ENCOUNTER — Encounter: Payer: Self-pay | Admitting: Acute Care

## 2022-05-30 DIAGNOSIS — R262 Difficulty in walking, not elsewhere classified: Secondary | ICD-10-CM | POA: Insufficient documentation

## 2022-05-30 DIAGNOSIS — M6281 Muscle weakness (generalized): Secondary | ICD-10-CM | POA: Diagnosis present

## 2022-05-30 NOTE — Therapy (Signed)
OUTPATIENT PHYSICAL THERAPY TREATMENT NOTE   Patient Name: Michelle Ruiz MRN: [id number] DOB:05-Sep-1970, 52 y.o., female Today's Date: 05/30/2022  PCP: Tressia Miners MD REFERRING PROVIDER: Manuella Ghazi MD  END OF SESSION:  PT End of Session - 05/30/22 1515     Visit Number 46    Number of Visits 80    Date for PT Re-Evaluation 08/08/22    Authorization Type [employer] COMM Pro    Authorization Time Period 06/15/21-09/07/21    Authorization - Visit Number 6    Authorization - Number of Visits 10    Progress Note Due on Visit 10    PT Start Time 1510    PT Stop Time [id number]    PT Time Calculation (min) 38 min    Equipment Utilized During Treatment Gait belt    Activity Tolerance Patient tolerated treatment well    Behavior During Therapy WFL for tasks assessed/performed                  Past Medical History:  Diagnosis Date   Abnormal Pap smear of cervix    Actinic keratosis    Anxiety    Basal cell carcinoma 04/14/2008   Right upper ant. thigh. BCC with sclerosis.   Basal cell carcinoma 08/14/2008   Right lat. lower thigh. Superficial.    Basal cell carcinoma 04/29/2019   Right mid forearm. Superficial and nodular patterns. EDC.   Basal cell carcinoma 08/17/2020   Right upper antecubital. EDC.   Basal cell carcinoma 08/23/2021   Left Lower Leg, EDc   Kidney stones    MS (multiple sclerosis) ([place])    UTI (urinary tract infection)    Past Surgical History:  Procedure Laterality Date   BREAST BIOPSY Right 2010   stereotactic biopsy/ neg/ dr.byrnett   BREAST BIOPSY Left 2012   neg/dr. brynett   CESAREAN SECTION  2008   [place]   TONSILLECTOMY  2006   There are no problems to display for this patient.   REFERRING DIAG: Unsteadiness; difficulty walking  THERAPY DIAG:  Difficulty in walking, not elsewhere classified  Muscle weakness (generalized)  Rationale for Evaluation and Treatment Rehabilitation  PERTINENT HISTORY:  Patient reports history of falls x3 in the past 6  months secondary to drop foot on the L LE. PMH: Multiple sclerosis; elbow fracture of the R UE   PRECAUTIONS: falls  SUBJECTIVE:   SUBJECTIVE STATEMENT:  Pt reports after last session her pecs were so sore it hurt to take a deep breath. No falls since last visit. Walking is going well.    PAIN:  Are you having pain? No   TODAY'S TREATMENT:                                                                                                                                         DATE: 05/23/22 Therex: - Recumbent bike: 7.35mnutes L5 273m; L8 66m60m L5 1mi60m  L9 50mn; L6 133m; L10 30sec - Walking lunge 2x 12 with 1 instance of lack of L foot clearance - Duck walks 2 [id number] good carry over - single leg RDL cone touch x12 bilat with CLUE touch for balance; good carry over of set up cuing - Abd deadmills 2x 12 bilat with unilateral UE touch for balance    PATIENT EDUCATION: Education details: Patient was educated on diagnosis, anatomy and pathology involved, prognosis, role of PT, and was given an HEP, demonstrating exercise with proper form following verbal and tactile cues, and was given a paper hand out to continue exercise at home. Pt was educated on and agreed to plan of care.  Person educated: Patient Education method: Explanation, Demonstration, and Verbal cues Education comprehension: verbalized understanding and returned demonstration  HOME EXERCISE PROGRAM: Walking program  Clinical Impression: PT continued therex progression for increased static and dynamic balance, and generalized conditioning.  PT focused on active hip flex and DF for foot clearance needed to provide LLE clearance through gait, and prevent fall risk. Pt responded well to static/dynamic stabilization therex. Pt is able to comply with all cuing for proper form and technique of all therex with excellent effort throughout session. PT issued appropriate rest breaks throughout the session for condition. PT will  continue progression as able. Pt would continue to benefit from skilled PT to address aforementioned deficits to improve QoL.   PT Long Term Goals - 11/29/21 1519       PT LONG TERM GOAL #1   Title Patient will be independent with balance HEP to maximize safety in ADL, IADL, and fitness activity    Baseline walking only; 09/05/21 is walking for exercise    Time 4    Period Weeks    Status Achieved    Target Date 07/13/21      PT LONG TERM GOAL #2   Title Pt to demonstrate improvement in minibesTest >3 points to demonstrate improved posture control and reduced falls risk.    Baseline 09/05/21 20/28; 11/29/21 20/28    Time 5    Period Weeks    Status New      PT LONG TERM GOAL #3   Title Pt to perform SLS >15 sec bilat to indicate improved single limb motor control in gait.    Baseline 02/25/2019: 14sec (retest deferred to visit 2); 09/05/21 R 20sec; L 7sec    Time 6    Period Weeks    Status Revised      PT LONG TERM GOAL #4   Title Pt will improve 6 MWT >1500f52f demonstrate improved cardiorespiratory fitness    Baseline 06/15/21: 1100ft56fit belt, no AD);  09/05/21: 1200ft 20ft belt, no AD)    Time 7    Period Weeks    Status Deferred      PT LONG TERM GOAL #5   Title Pt to demonstrate 30sec chair rise >13 to indicate improved power in BLE.    Baseline 09/05/21 9; 11/29/21 10.5    Time 8    Period Weeks    Status On-going      PT LONG TERM GOAL  #11   TITLE Pt will demonstrate L SLS of 21 sec in order to demonstrate symmetrical static balance of unaffected LE to decrease fall risk    Baseline 02/15/21 R 21 sec; L 7.73; 11/29/21 R 22sec  L 8sec    Time 8    Period Weeks    Status On-going  Durwin Reges DPT 05/30/2022, 3:51 PM

## 2022-06-02 ENCOUNTER — Ambulatory Visit: Payer: 59

## 2022-06-02 ENCOUNTER — Ambulatory Visit
Admission: RE | Admit: 2022-06-02 | Discharge: 2022-06-02 | Disposition: A | Discharge status: Home / Self Care | Payer: 59 | Source: Ambulatory Visit | Attending: Acute Care | Admitting: Acute Care

## 2022-06-02 DIAGNOSIS — Z87891 Personal history of nicotine dependence: Secondary | ICD-10-CM

## 2022-06-05 ENCOUNTER — Other Ambulatory Visit: Payer: Self-pay | Admitting: Acute Care

## 2022-06-05 DIAGNOSIS — Z87891 Personal history of nicotine dependence: Secondary | ICD-10-CM

## 2022-06-05 DIAGNOSIS — Z122 Encounter for screening for malignant neoplasm of respiratory organs: Secondary | ICD-10-CM

## 2022-06-06 ENCOUNTER — Encounter: Payer: Self-pay | Admitting: Physical Therapy

## 2022-06-06 ENCOUNTER — Ambulatory Visit: Payer: No Typology Code available for payment source | Admitting: Physical Therapy

## 2022-06-06 DIAGNOSIS — R262 Difficulty in walking, not elsewhere classified: Secondary | ICD-10-CM

## 2022-06-06 NOTE — Therapy (Signed)
OUTPATIENT PHYSICAL THERAPY TREATMENT NOTE   Patient Name: Michelle Ruiz MRN: [id number] DOB:Feb 03, 1971, 52 y.o., female Today's Date: 06/06/2022  PCP: Tressia Miners MD REFERRING PROVIDER: Manuella Ghazi MD  END OF SESSION:  PT End of Session - 06/06/22 1520     Visit Number 49    Number of Visits 16    Date for PT Re-Evaluation 08/08/22    Authorization Type [employer] COMM Pro    Authorization Time Period 06/15/21-09/07/21    Authorization - Visit Number 7    Authorization - Number of Visits 10    Progress Note Due on Visit 10    PT Start Time 1510    PT Stop Time 1550    PT Time Calculation (min) 40 min    Equipment Utilized During Treatment Gait belt    Activity Tolerance Patient tolerated treatment well    Behavior During Therapy WFL for tasks assessed/performed                   Past Medical History:  Diagnosis Date   Abnormal Pap smear of cervix    Actinic keratosis    Anxiety    Basal cell carcinoma 04/14/2008   Right upper ant. thigh. BCC with sclerosis.   Basal cell carcinoma 08/14/2008   Right lat. lower thigh. Superficial.    Basal cell carcinoma 04/29/2019   Right mid forearm. Superficial and nodular patterns. EDC.   Basal cell carcinoma 08/17/2020   Right upper antecubital. EDC.   Basal cell carcinoma 08/23/2021   Left Lower Leg, EDc   Kidney stones    MS (multiple sclerosis) ([place])    UTI (urinary tract infection)    Past Surgical History:  Procedure Laterality Date   BREAST BIOPSY Right 2010   stereotactic biopsy/ neg/ dr.byrnett   BREAST BIOPSY Left 2012   neg/dr. brynett   CESAREAN SECTION  2008   [place]   TONSILLECTOMY  2006   There are no problems to display for this patient.   REFERRING DIAG: Unsteadiness; difficulty walking  THERAPY DIAG:  Difficulty in walking, not elsewhere classified  Rationale for Evaluation and Treatment Rehabilitation  PERTINENT HISTORY:  Patient reports history of falls x3 in the past 6 months secondary to drop foot  on the L LE. PMH: Multiple sclerosis; elbow fracture of the R UE   PRECAUTIONS: falls  SUBJECTIVE:   SUBJECTIVE STATEMENT:  Pt reports no pain or soreness since last session. No falls   PAIN:  Are you having pain? No   TODAY'S TREATMENT:                                                                                                                                         DATE: 05/23/22 Therex: - Recumbent bike: 7.47mnutes L5 273m; L8 39m39m L5 1mi75mL9 1min439m6 1min;75m0 30sec - Soccer ball stop with L foot, kick with R 2x 15 with  good carry over of cuing - Walking lunge 2x 12 with 1 instance of lack of L foot clearance Alt heel taps on 6in cone 1 finger support 2x 20 with success Lateral lunge onto foam pad x12 bilat with 1 finger support    PATIENT EDUCATION: Education details: Patient was educated on diagnosis, anatomy and pathology involved, prognosis, role of PT, and was given an HEP, demonstrating exercise with proper form following verbal and tactile cues, and was given a paper hand out to continue exercise at home. Pt was educated on and agreed to plan of care.  Person educated: Patient Education method: Explanation, Demonstration, and Verbal cues Education comprehension: verbalized understanding and returned demonstration  HOME EXERCISE PROGRAM: Walking program  Clinical Impression: PT continued therex progression for increased static and dynamic balance, and generalized conditioning. Pt is able to comply with all cuing for proper technique of therex with good motivation and no pain throughout session. SBA for safety throughout session. Pt would continue to benefit from skilled PT to address aforementioned deficits to improve QoL.   PT Long Term Goals - 11/29/21 1519       PT LONG TERM GOAL #1   Title Patient will be independent with balance HEP to maximize safety in ADL, IADL, and fitness activity    Baseline walking only; 09/05/21 is walking for exercise    Time 4     Period Weeks    Status Achieved    Target Date 07/13/21      PT LONG TERM GOAL #2   Title Pt to demonstrate improvement in minibesTest >3 points to demonstrate improved posture control and reduced falls risk.    Baseline 09/05/21 20/28; 11/29/21 20/28    Time 5    Period Weeks    Status New      PT LONG TERM GOAL #3   Title Pt to perform SLS >15 sec bilat to indicate improved single limb motor control in gait.    Baseline 02/25/2019: 14sec (retest deferred to visit 2); 09/05/21 R 20sec; L 7sec    Time 6    Period Weeks    Status Revised      PT LONG TERM GOAL #4   Title Pt will improve 6 MWT >1539f to demonstrate improved cardiorespiratory fitness    Baseline 06/15/21: 11022f(gait belt, no AD);  09/05/21: 120037fgait belt, no AD)    Time 7    Period Weeks    Status Deferred      PT LONG TERM GOAL #5   Title Pt to demonstrate 30sec chair rise >13 to indicate improved power in BLE.    Baseline 09/05/21 9; 11/29/21 10.5    Time 8    Period Weeks    Status On-going      PT LONG TERM GOAL  #11   TITLE Pt will demonstrate L SLS of 21 sec in order to demonstrate symmetrical static balance of unaffected LE to decrease fall risk    Baseline 02/15/21 R 21 sec; L 7.73; 11/29/21 R 22sec  L 8sec    Time 8    Period Weeks    Status On-going              [name] 06/06/2022, 3:54 PM

## 2022-06-13 ENCOUNTER — Ambulatory Visit: Payer: No Typology Code available for payment source | Admitting: Physical Therapy

## 2022-06-20 ENCOUNTER — Ambulatory Visit: Payer: No Typology Code available for payment source | Admitting: Physical Therapy

## 2022-06-20 ENCOUNTER — Encounter: Payer: Self-pay | Admitting: Physical Therapy

## 2022-06-20 DIAGNOSIS — R262 Difficulty in walking, not elsewhere classified: Secondary | ICD-10-CM

## 2022-06-20 NOTE — Therapy (Signed)
OUTPATIENT PHYSICAL THERAPY TREATMENT NOTE   Patient Name: Michelle Ruiz MRN: [id number] DOB:1971/01/23, 52 y.o., female Today's Date: 06/20/2022  PCP: Tressia Miners MD REFERRING PROVIDER: Manuella Ghazi MD  END OF SESSION:  PT End of Session - 06/20/22 1529     Visit Number 48    Number of Visits 80    Date for PT Re-Evaluation 08/08/22    Authorization Type [employer] COMM Pro    Authorization Time Period 06/15/21-09/07/21    Authorization - Visit Number 8    Authorization - Number of Visits 10    Progress Note Due on Visit 10    PT Start Time 1512    PT Stop Time 1550    PT Time Calculation (min) 38 min    Activity Tolerance Patient tolerated treatment well    Behavior During Therapy WFL for tasks assessed/performed                    Past Medical History:  Diagnosis Date   Abnormal Pap smear of cervix    Actinic keratosis    Anxiety    Basal cell carcinoma 04/14/2008   Right upper ant. thigh. BCC with sclerosis.   Basal cell carcinoma 08/14/2008   Right lat. lower thigh. Superficial.    Basal cell carcinoma 04/29/2019   Right mid forearm. Superficial and nodular patterns. EDC.   Basal cell carcinoma 08/17/2020   Right upper antecubital. EDC.   Basal cell carcinoma 08/23/2021   Left Lower Leg, EDc   Kidney stones    MS (multiple sclerosis) ([place])    UTI (urinary tract infection)    Past Surgical History:  Procedure Laterality Date   BREAST BIOPSY Right 2010   stereotactic biopsy/ neg/ dr.byrnett   BREAST BIOPSY Left 2012   neg/dr. brynett   CESAREAN SECTION  2008   [place]   TONSILLECTOMY  2006   There are no problems to display for this patient.   REFERRING DIAG: Unsteadiness; difficulty walking  THERAPY DIAG:  Difficulty in walking, not elsewhere classified  Rationale for Evaluation and Treatment Rehabilitation  PERTINENT HISTORY:  Patient reports history of falls x3 in the past 6 months secondary to drop foot on the L LE. PMH: Multiple sclerosis; elbow  fracture of the R UE   PRECAUTIONS: falls  SUBJECTIVE:   SUBJECTIVE STATEMENT:  Pt reports no pain or soreness since last session. No falls   PAIN:  Are you having pain? No   TODAY'S TREATMENT:                                                                                                                                         DATE: 05/23/22 Therex: - Recumbent bike: 7.34minutes L5 9min; L8 56min; L5 18min; L9 72min; L6 30min; L10 30sec - Stool scoots (fwd and backward) 2x 26ft each direction  - Soccer ball kicks from random angles to promote shuffle  between kicks with success x20 kicks x2 rounds - Walking lunge 29ft; with pink tball overhead 73ft      PATIENT EDUCATION: Education details: Patient was educated on diagnosis, anatomy and pathology involved, prognosis, role of PT, and was given an HEP, demonstrating exercise with proper form following verbal and tactile cues, and was given a paper hand out to continue exercise at home. Pt was educated on and agreed to plan of care.  Person educated: Patient Education method: Explanation, Demonstration, and Verbal cues Education comprehension: verbalized understanding and returned demonstration  HOME EXERCISE PROGRAM: Walking program  Clinical Impression: PT continued therex progression for increased static and dynamic balance, and generalized conditioning. Pt is able to comply with all cuing for proper technique of therex with good motivation and no pain throughout session. SBA for safety throughout session. Pt would continue to benefit from skilled PT to address aforementioned deficits to improve QoL.   PT Long Term Goals - 11/29/21 1519       PT LONG TERM GOAL #1   Title Patient will be independent with balance HEP to maximize safety in ADL, IADL, and fitness activity    Baseline walking only; 09/05/21 is walking for exercise    Time 4    Period Weeks    Status Achieved    Target Date 07/13/21      PT LONG TERM GOAL #2    Title Pt to demonstrate improvement in minibesTest >3 points to demonstrate improved posture control and reduced falls risk.    Baseline 09/05/21 20/28; 11/29/21 20/28    Time 5    Period Weeks    Status New      PT LONG TERM GOAL #3   Title Pt to perform SLS >15 sec bilat to indicate improved single limb motor control in gait.    Baseline 02/25/2019: 14sec (retest deferred to visit 2); 09/05/21 R 20sec; L 7sec    Time 6    Period Weeks    Status Revised      PT LONG TERM GOAL #4   Title Pt will improve 6 MWT >1564ft to demonstrate improved cardiorespiratory fitness    Baseline 06/15/21: 1189ft (gait belt, no AD);  09/05/21: 1227ft (gait belt, no AD)    Time 7    Period Weeks    Status Deferred      PT LONG TERM GOAL #5   Title Pt to demonstrate 30sec chair rise >13 to indicate improved power in BLE.    Baseline 09/05/21 9; 11/29/21 10.5    Time 8    Period Weeks    Status On-going      PT LONG TERM GOAL  #11   TITLE Pt will demonstrate L SLS of 21 sec in order to demonstrate symmetrical static balance of unaffected LE to decrease fall risk    Baseline 02/15/21 R 21 sec; L 7.73; 11/29/21 R 22sec  L 8sec    Time 8    Period Weeks    Status On-going             Durwin Reges DPT Durwin Reges DPT 06/20/2022, 3:50 PM

## 2022-06-27 ENCOUNTER — Ambulatory Visit: Payer: No Typology Code available for payment source | Admitting: Physical Therapy

## 2022-07-04 ENCOUNTER — Encounter: Payer: Self-pay | Admitting: Physical Therapy

## 2022-07-04 ENCOUNTER — Ambulatory Visit: Payer: No Typology Code available for payment source | Attending: Neurology | Admitting: Physical Therapy

## 2022-07-04 DIAGNOSIS — M6281 Muscle weakness (generalized): Secondary | ICD-10-CM | POA: Insufficient documentation

## 2022-07-04 DIAGNOSIS — Z9181 History of falling: Secondary | ICD-10-CM | POA: Diagnosis present

## 2022-07-04 DIAGNOSIS — R262 Difficulty in walking, not elsewhere classified: Secondary | ICD-10-CM | POA: Insufficient documentation

## 2022-07-04 NOTE — Therapy (Signed)
OUTPATIENT PHYSICAL THERAPY TREATMENT NOTE   Patient Name: Michelle Ruiz MRN: [id number] DOB:[birth year], 52 y.o., female Today's Date: 07/04/2022  PCP: Nemiah Commander MD REFERRING PROVIDER: Sherryll Burger MD  END OF SESSION:  PT End of Session - 07/04/22 1518     Visit Number 49    Number of Visits 80    Date for PT Re-Evaluation 08/08/22    Authorization Type [employer] COMM Pro    Authorization Time Period 06/15/21-09/07/21    Authorization - Visit Number 9    Authorization - Number of Visits 10    Progress Note Due on Visit 10    PT Start Time 1510    PT Stop Time 1549    PT Time Calculation (min) 39 min    Activity Tolerance Patient tolerated treatment well    Behavior During Therapy WFL for tasks assessed/performed                     Past Medical History:  Diagnosis Date   Abnormal Pap smear of cervix    Actinic keratosis    Anxiety    Basal cell carcinoma 04/14/2008   Right upper ant. thigh. BCC with sclerosis.   Basal cell carcinoma 08/14/2008   Right lat. lower thigh. Superficial.    Basal cell carcinoma 04/29/2019   Right mid forearm. Superficial and nodular patterns. EDC.   Basal cell carcinoma 08/17/2020   Right upper antecubital. EDC.   Basal cell carcinoma 08/23/2021   Left Lower Leg, EDc   Kidney stones    MS (multiple sclerosis)    UTI (urinary tract infection)    Past Surgical History:  Procedure Laterality Date   BREAST BIOPSY Right 2010   stereotactic biopsy/ neg/ dr.byrnett   BREAST BIOPSY Left 2012   neg/dr. brynett   CESAREAN SECTION  2008   [facility]   TONSILLECTOMY  2006   There are no problems to display for this patient.   REFERRING DIAG: Unsteadiness; difficulty walking  THERAPY DIAG:  Difficulty in walking, not elsewhere classified  Muscle weakness (generalized)  Rationale for Evaluation and Treatment Rehabilitation  PERTINENT HISTORY:  Patient reports history of falls x3 in the past 6 months secondary to drop foot on the L LE. PMH:  Multiple sclerosis; elbow fracture of the R UE   PRECAUTIONS: falls  SUBJECTIVE:   SUBJECTIVE STATEMENT:  Pt reports no pain or soreness since last session. No falls   PAIN:  Are you having pain? No   TODAY'S TREATMENT:                                                                                                                                         DATE: 07/04/22 Therex: - Recumbent bike: 7.75minutes L5 ; L8 ; L5 ; L9 ; L6 ; L10 30sec - Box walk around gym fwd > L lateral walk > backward walk > R lateral walk  x2 rounds (seated rest between) with CGA for safety  - Soccer ball kicks from random angles to promote shuffle between kicks with success x20 kicks x2 rounds - deadmill 2x 30sec with excellent carry over of demo     PATIENT EDUCATION: Education details: Patient was educated on diagnosis, anatomy and pathology involved, prognosis, role of PT, and was given an HEP, demonstrating exercise with proper form following verbal and tactile cues, and was given a paper hand out to continue exercise at home. Pt was educated on and agreed to plan of care.  Person educated: Patient Education method: Explanation, Demonstration, and Verbal cues Education comprehension: verbalized understanding and returned demonstration  HOME EXERCISE PROGRAM: Walking program  Clinical Impression: PT continued therex progression for increased static and dynamic balance, and generalized conditioning. Pt is able to comply with all cuing for proper technique of therex with good motivation and no pain throughout session. SBA for safety throughout session. Pt would continue to benefit from skilled PT to address aforementioned deficits to improve QoL.   PT Long Term Goals - 11/29/21 1519       PT LONG TERM GOAL #1   Title Patient will be independent with balance HEP to maximize safety in ADL, IADL, and fitness activity    Baseline walking only; 09/05/21 is walking for exercise    Time 4     Period Weeks    Status Achieved    Target Date 07/13/21      PT LONG TERM GOAL #2   Title Pt to demonstrate improvement in minibesTest >3 points to demonstrate improved posture control and reduced falls risk.    Baseline 09/05/21 20/28; 11/29/21 20/28    Time 5    Period Weeks    Status New      PT LONG TERM GOAL #3   Title Pt to perform SLS >15 sec bilat to indicate improved single limb motor control in gait.    Baseline 02/25/2019: 14sec (retest deferred to visit 2); 09/05/21 R 20sec; L 7sec    Time 6    Period Weeks    Status Revised      PT LONG TERM GOAL #4   Title Pt will improve 6 MWT >1565ft to demonstrate improved cardiorespiratory fitness    Baseline 06/15/21: 1140ft (gait belt, no AD);  09/05/21: 1218ft (gait belt, no AD)    Time 7    Period Weeks    Status Deferred      PT LONG TERM GOAL #5   Title Pt to demonstrate 30sec chair rise >13 to indicate improved power in BLE.    Baseline 09/05/21 9; 11/29/21 10.5    Time 8    Period Weeks    Status On-going      PT LONG TERM GOAL  #11   TITLE Pt will demonstrate L SLS of 21 sec in order to demonstrate symmetrical static balance of unaffected LE to decrease fall risk    Baseline 02/15/21 R 21 sec; L 7.73; 11/29/21 R 22sec  L 8sec    Time 8    Period Weeks    Status On-going             Hilda Lias DPT Hilda Lias DPT 07/04/2022, 3:49 PM

## 2022-07-11 ENCOUNTER — Ambulatory Visit: Payer: No Typology Code available for payment source

## 2022-07-18 ENCOUNTER — Ambulatory Visit: Payer: No Typology Code available for payment source

## 2022-07-18 ENCOUNTER — Encounter: Payer: 59 | Admitting: Physical Therapy

## 2022-07-18 DIAGNOSIS — R262 Difficulty in walking, not elsewhere classified: Secondary | ICD-10-CM | POA: Diagnosis not present

## 2022-07-18 DIAGNOSIS — M6281 Muscle weakness (generalized): Secondary | ICD-10-CM

## 2022-07-18 DIAGNOSIS — Z9181 History of falling: Secondary | ICD-10-CM

## 2022-07-18 NOTE — Therapy (Signed)
OUTPATIENT PHYSICAL THERAPY TREATMENT NOTE   Patient Name: Michelle Ruiz MRN: [id number] DOB:02-07-1971, 52 y.o., female Today's Date: 07/18/2022  PCP: Nemiah Commander MD REFERRING PROVIDER: Sherryll Burger MD  END OF SESSION:  PT End of Session - 07/18/22 1520     Visit Number 50    Number of Visits 80    Date for PT Re-Evaluation 08/08/22    Authorization Type [employer] COMM Pro    Authorization Time Period 06/15/21-09/07/21    Authorization - Visit Number 50    Authorization - Number of Visits 50    Progress Note Due on Visit 50    PT Start Time 1516    PT Stop Time 1600    PT Time Calculation (min) 44 min    Activity Tolerance Patient tolerated treatment well    Behavior During Therapy WFL for tasks assessed/performed               Past Medical History:  Diagnosis Date   Abnormal Pap smear of cervix    Actinic keratosis    Anxiety    Basal cell carcinoma 04/14/2008   Right upper ant. thigh. BCC with sclerosis.   Basal cell carcinoma 08/14/2008   Right lat. lower thigh. Superficial.    Basal cell carcinoma 04/29/2019   Right mid forearm. Superficial and nodular patterns. EDC.   Basal cell carcinoma 08/17/2020   Right upper antecubital. EDC.   Basal cell carcinoma 08/23/2021   Left Lower Leg, EDc   Kidney stones    MS (multiple sclerosis)    UTI (urinary tract infection)    Past Surgical History:  Procedure Laterality Date   BREAST BIOPSY Right 2010   stereotactic biopsy/ neg/ dr.byrnett   BREAST BIOPSY Left 2012   neg/dr. brynett   CESAREAN SECTION  2008   RPH   TONSILLECTOMY  2006   There are no problems to display for this patient.   REFERRING DIAG: Unsteadiness; difficulty walking  THERAPY DIAG:  Difficulty in walking, not elsewhere classified  Muscle weakness (generalized)  History of falling  Rationale for Evaluation and Treatment Rehabilitation  PERTINENT HISTORY: Patient reports history of falls x3 in the past 6 months secondary to drop foot on the L  LE. PMH: Multiple sclerosis; elbow fracture of the R UE   PRECAUTIONS: Falls  SUBJECTIVE:   SUBJECTIVE STATEMENT:  Pt reports no falls upon arrival.  Pt states that she hated to miss last week because it makes a huge difference for     PAIN:  Are you having pain? No   TODAY'S TREATMENT: DATE: 07/18/22  TherAct:  Goal assessment performed and noted below:    PATIENT EDUCATION: Education details: Patient was educated on diagnosis, anatomy and pathology involved, prognosis, role of PT, and was given an HEP, demonstrating exercise with proper form following verbal and tactile cues, and was given a paper hand out to continue exercise at home. Pt was educated on and agreed to plan of care.  Person educated: Patient Education method: Explanation, Demonstration, and Verbal cues Education comprehension: verbalized understanding and returned demonstration  HOME EXERCISE PROGRAM: Walking program  CLINICAL IMPRESSION:  Pt performed well and put forth good effort throughout the entirety of the session.  Pt underwent goal assessment today in order to assess functional mobility and for future goal planning.  Pt is making progress towards certain goals, specifically with the min BesTest, and is maintaining ability to perform other balance related tasks as noted below.  Patient's condition has the potential to improve  in response to therapy. Maximum improvement is yet to be obtained. The anticipated improvement is attainable and reasonable in a generally predictable time.   Pt will continue to benefit from skilled therapy to address remaining deficits in order to improve overall QoL and return to PLOF.         PT Long Term Goals - 11/29/21 1519       PT LONG TERM GOAL #1   Title Patient will be independent with balance HEP to maximize safety in ADL, IADL, and fitness activity    Baseline walking only; 09/05/21 is walking for exercise    Time 4    Period Weeks    Status Achieved     Target Date 07/13/21      PT LONG TERM GOAL #2   Title Pt to demonstrate improvement in minibesTest >3 points to demonstrate improved posture control and reduced falls risk.    Baseline 09/05/21 20/28;  11/29/21 20/28 07/18/22: 22/28   Time 5    Period Weeks    Status PROGRESSING      PT LONG TERM GOAL #3   Title Pt to perform SLS >15 sec bilat to indicate improved single limb motor control in gait.    Baseline 02/25/2019: 14sec (retest deferred to visit 2); 09/05/21 R 20sec; L 7sec    Time 6    Period Weeks    Status Revised      PT LONG TERM GOAL #4   Title Pt will improve 6 MWT >1580ft to demonstrate improved cardiorespiratory fitness    Baseline 06/15/21: 1169ft (gait belt, no AD);   09/05/21: 1230ft (gait belt, no AD); 07/18/22: 1081ft (gait belt, no AD)   Time 7    Period Weeks    Status Deferred      PT LONG TERM GOAL #5   Title Pt to demonstrate 30sec chair rise >13 to indicate improved power in BLE.    Baseline 09/05/21: 9;  11/29/21: 10.5  07/18/22: 9.5   Time 8    Period Weeks    Status On-going      PT LONG TERM GOAL  #11   TITLE Pt will demonstrate L SLS of 21 sec in order to demonstrate symmetrical static balance of unaffected LE to decrease fall risk    Baseline 02/15/21 R 21 sec; L 7.73;  11/29/21 R 22sec  L 8sec  07/18/22: R: 19.83 sec L: 5.76 sec   Time 8    Period Weeks    Status On-going             PLAN FOR NEXT SESSION: Continue with current POC and update exercises as necessary.  Assess HEP.   Nolon Bussing, PT, DPT Physical Therapist - [facility]  07/18/22, 3:21 PM

## 2022-07-25 ENCOUNTER — Ambulatory Visit: Payer: No Typology Code available for payment source

## 2022-07-25 DIAGNOSIS — M6281 Muscle weakness (generalized): Secondary | ICD-10-CM

## 2022-07-25 DIAGNOSIS — Z9181 History of falling: Secondary | ICD-10-CM

## 2022-07-25 DIAGNOSIS — R262 Difficulty in walking, not elsewhere classified: Secondary | ICD-10-CM | POA: Diagnosis not present

## 2022-07-25 NOTE — Therapy (Signed)
OUTPATIENT PHYSICAL THERAPY TREATMENT NOTE   Patient Name: Michelle Ruiz MRN: [id number] DOB:01/18/1971, 52 y.o., female Today's Date: 07/25/2022  PCP: Nemiah Commander MD REFERRING PROVIDER: Sherryll Burger MD  END OF SESSION:  PT End of Session - 07/25/22 1524     Visit Number 51    Number of Visits 80    Date for PT Re-Evaluation 08/08/22    Authorization Type [employer] COMM Pro    Authorization Time Period 06/15/21-09/07/21    Authorization - Visit Number 51    Authorization - Number of Visits 60    Progress Note Due on Visit 60    PT Start Time 1523    PT Stop Time 1604    PT Time Calculation (min) 41 min    Activity Tolerance Patient tolerated treatment well    Behavior During Therapy WFL for tasks assessed/performed               Past Medical History:  Diagnosis Date   Abnormal Pap smear of cervix    Actinic keratosis    Anxiety    Basal cell carcinoma 04/14/2008   Right upper ant. thigh. BCC with sclerosis.   Basal cell carcinoma 08/14/2008   Right lat. lower thigh. Superficial.    Basal cell carcinoma 04/29/2019   Right mid forearm. Superficial and nodular patterns. EDC.   Basal cell carcinoma 08/17/2020   Right upper antecubital. EDC.   Basal cell carcinoma 08/23/2021   Left Lower Leg, EDc   Kidney stones    MS (multiple sclerosis) (HCC)    UTI (urinary tract infection)    Past Surgical History:  Procedure Laterality Date   BREAST BIOPSY Right 2010   stereotactic biopsy/ neg/ dr.byrnett   BREAST BIOPSY Left 2012   neg/dr. brynett   CESAREAN SECTION  2008   RPH   TONSILLECTOMY  2006   There are no problems to display for this patient.   REFERRING DIAG: Unsteadiness; difficulty walking  THERAPY DIAG:  No diagnosis found.  Rationale for Evaluation and Treatment Rehabilitation  PERTINENT HISTORY: Patient reports history of falls x3 in the past 6 months secondary to drop foot on the L LE. PMH: Multiple sclerosis; elbow fracture of the R UE   PRECAUTIONS:  Falls  SUBJECTIVE:   SUBJECTIVE STATEMENT:  Pt reports no falls upon arrival.  Pt states that she hated to miss last week because it makes a huge difference for     PAIN:  Are you having pain? No   TODAY'S TREATMENT: DATE: 07/25/22  Therex:  Recumbent bike: 7.64minutes L5 ; L8 ; L5 ; L9 ; L6 ; L10 30sec Box walk around gym fwd > L lateral walk > backward walk > R lateral walk x2 rounds (seated rest between) with CGA for safety  Seated soccer ball kicks from random angles to promote shuffle between kicks with success x20 kicks x2 rounds Standing cone taps for increased ankle righting strategies, 9 cone taps each LE, x2 Standing hip abduction at Matrix machine, L LE 45#, R LE 55#, x10 each  Standing hip extension at Matrix machine, L LE 45#, R LE 55#, x10 each  Standing hip flexion at Matrix machine, L LE 45#, R LE 55#, x10 each   PATIENT EDUCATION: Education details: Patient was educated on diagnosis, anatomy and pathology involved, prognosis, role of PT, and was given an HEP, demonstrating exercise with proper form following verbal and tactile cues, and was given a paper hand out to continue exercise at home. Pt was educated  on and agreed to plan of care.  Person educated: Patient Education method: Explanation, Demonstration, and Verbal cues Education comprehension: verbalized understanding and returned demonstration  HOME EXERCISE PROGRAM: Walking program  CLINICAL IMPRESSION:  Pt responded well to the exercises and noted to be challenged by the hip strengthening exercises.  Pt noted to have 3-4 episodes of mild instability in which the pt could correct on her own.  CGA/gait belt utilized throughout the treatment session unless otherwise noted.  Pt will continue to improve with stamina and strength related exercises as therapy progresses.   Pt will continue to benefit from skilled therapy to address remaining deficits in order to improve overall QoL and  return to PLOF.          PT Long Term Goals - 11/29/21 1519       PT LONG TERM GOAL #1   Title Patient will be independent with balance HEP to maximize safety in ADL, IADL, and fitness activity    Baseline walking only; 09/05/21 is walking for exercise    Time 4    Period Weeks    Status Achieved    Target Date 07/13/21      PT LONG TERM GOAL #2   Title Pt to demonstrate improvement in minibesTest >3 points to demonstrate improved posture control and reduced falls risk.    Baseline 09/05/21 20/28;  11/29/21 20/28 07/18/22: 22/28   Time 5    Period Weeks    Status PROGRESSING      PT LONG TERM GOAL #3   Title Pt to perform SLS >15 sec bilat to indicate improved single limb motor control in gait.    Baseline 02/25/2019: 14sec (retest deferred to visit 2); 09/05/21 R 20sec; L 7sec    Time 6    Period Weeks    Status Revised      PT LONG TERM GOAL #4   Title Pt will improve 6 MWT >1555ft to demonstrate improved cardiorespiratory fitness    Baseline 06/15/21: 1126ft (gait belt, no AD);   09/05/21: 1274ft (gait belt, no AD); 07/18/22: 1043ft (gait belt, no AD)   Time 7    Period Weeks    Status Deferred      PT LONG TERM GOAL #5   Title Pt to demonstrate 30sec chair rise >13 to indicate improved power in BLE.    Baseline 09/05/21: 9;  11/29/21: 10.5  07/18/22: 9.5   Time 8    Period Weeks    Status On-going      PT LONG TERM GOAL  #11   TITLE Pt will demonstrate L SLS of 21 sec in order to demonstrate symmetrical static balance of unaffected LE to decrease fall risk    Baseline 02/15/21 R 21 sec; L 7.73;  11/29/21 R 22sec  L 8sec  07/18/22: R: 19.83 sec L: 5.76 sec   Time 8    Period Weeks    Status On-going             PLAN FOR NEXT SESSION: Continue with current POC and update exercises as necessary.  Assess HEP.   Nolon Bussing, PT, DPT Physical Therapist - [facility]  07/25/22, 4:57 PM

## 2022-08-01 ENCOUNTER — Ambulatory Visit: Payer: 59 | Attending: Neurology

## 2022-08-01 DIAGNOSIS — R262 Difficulty in walking, not elsewhere classified: Secondary | ICD-10-CM | POA: Diagnosis present

## 2022-08-01 DIAGNOSIS — M6281 Muscle weakness (generalized): Secondary | ICD-10-CM | POA: Diagnosis present

## 2022-08-01 DIAGNOSIS — Z9181 History of falling: Secondary | ICD-10-CM | POA: Diagnosis present

## 2022-08-01 DIAGNOSIS — M25521 Pain in right elbow: Secondary | ICD-10-CM | POA: Insufficient documentation

## 2022-08-01 DIAGNOSIS — M25621 Stiffness of right elbow, not elsewhere classified: Secondary | ICD-10-CM | POA: Insufficient documentation

## 2022-08-01 NOTE — Therapy (Signed)
OUTPATIENT PHYSICAL THERAPY TREATMENT NOTE   Patient Name: Michelle Ruiz MRN: [id number] DOB:July 17, 1970, 52 y.o., female Today's Date: 08/01/2022  PCP: Nemiah Commander MD REFERRING PROVIDER: Sherryll Burger MD  END OF SESSION:  PT End of Session - 08/01/22 1517     Visit Number 52    Number of Visits 80    Date for PT Re-Evaluation 08/08/22    Authorization Type [employer] COMM Pro    Authorization Time Period 06/15/21-09/07/21    Authorization - Visit Number 52    Authorization - Number of Visits 60    Progress Note Due on Visit 60    PT Start Time 1517    PT Stop Time 1600    PT Time Calculation (min) 43 min    Activity Tolerance Patient tolerated treatment well    Behavior During Therapy WFL for tasks assessed/performed               Past Medical History:  Diagnosis Date   Abnormal Pap smear of cervix    Actinic keratosis    Anxiety    Basal cell carcinoma 04/14/2008   Right upper ant. thigh. BCC with sclerosis.   Basal cell carcinoma 08/14/2008   Right lat. lower thigh. Superficial.    Basal cell carcinoma 04/29/2019   Right mid forearm. Superficial and nodular patterns. EDC.   Basal cell carcinoma 08/17/2020   Right upper antecubital. EDC.   Basal cell carcinoma 08/23/2021   Left Lower Leg, EDc   Kidney stones    MS (multiple sclerosis) (HCC)    UTI (urinary tract infection)    Past Surgical History:  Procedure Laterality Date   BREAST BIOPSY Right 2010   stereotactic biopsy/ neg/ dr.byrnett   BREAST BIOPSY Left 2012   neg/dr. brynett   CESAREAN SECTION  2008   RPH   TONSILLECTOMY  2006   There are no problems to display for this patient.   REFERRING DIAG: Unsteadiness; difficulty walking  THERAPY DIAG:  No diagnosis found.  Rationale for Evaluation and Treatment Rehabilitation  PERTINENT HISTORY: Patient reports history of falls x3 in the past 6 months secondary to drop foot on the L LE. PMH: Multiple sclerosis; elbow fracture of the R UE   PRECAUTIONS:  Falls  SUBJECTIVE:   SUBJECTIVE STATEMENT:  Pt reports she is having difficulty with walking today, and has been going on over the past couple of days.  Pt notes that it could be due to the heat outside.  Pt notes she almost stumbled coming into the clinic.      PAIN:  Are you having pain? No   TODAY'S TREATMENT: DATE: 08/01/22   Therex:  Recumbent bike: 6 mins L1 2 min, L3 2 min, L5 2 min Box walk around gym fwd > L lateral walk > backward walk > R lateral walk x2 rounds (seated rest between) with CGA for safety  Seated soccer ball kicks with 4# AW, from random angles to promote shuffle between kicks with success x20 kicks x2 rounds Ambulation around gym with 4# AW and high knees for increased hip flexor recruitment, x4 laps  Standing cone taps for increased ankle righting strategies, 9 cone taps each LE, x2 Standing hip abduction at Matrix machine, L LE 40#, R LE 55#, x15 each  Standing hip extension at Matrix machine, L LE 40#, R LE 55#, x15 each  Standing hip flexion at Matrix machine, L LE 40#, R LE 55#, x15 each  Standing hip adduction at Matrix machine, L LE 40#,  R LE 55#, x15 each     PATIENT EDUCATION: Education details: Patient was educated on diagnosis, anatomy and pathology involved, prognosis, role of PT, and was given an HEP, demonstrating exercise with proper form following verbal and tactile cues, and was given a paper hand out to continue exercise at home. Pt was educated on and agreed to plan of care.  Person educated: Patient Education method: Explanation, Demonstration, and Verbal cues Education comprehension: verbalized understanding and returned demonstration  HOME EXERCISE PROGRAM: Walking program  CLINICAL IMPRESSION:  Pt continues to respond well to being pushed throughout the appointment.  Pt was able to increase th amount of reps at the hip strengthening Matrix machine.  Pt is able to perform well with the soccer kicks with the additional  weight on the ankles as well.  Pt will continue to benefit from skilled therapy in order to improve balance and tolerance to exercises.   Pt will continue to benefit from skilled therapy to address remaining deficits in order to improve overall QoL and return to PLOF.          PT Long Term Goals - 11/29/21 1519       PT LONG TERM GOAL #1   Title Patient will be independent with balance HEP to maximize safety in ADL, IADL, and fitness activity    Baseline walking only; 09/05/21 is walking for exercise    Time 4    Period Weeks    Status Achieved    Target Date 07/13/21      PT LONG TERM GOAL #2   Title Pt to demonstrate improvement in minibesTest >3 points to demonstrate improved posture control and reduced falls risk.    Baseline 09/05/21 20/28;  11/29/21 20/28 07/18/22: 22/28   Time 5    Period Weeks    Status PROGRESSING      PT LONG TERM GOAL #3   Title Pt to perform SLS >15 sec bilat to indicate improved single limb motor control in gait.    Baseline 02/25/2019: 14sec (retest deferred to visit 2); 09/05/21 R 20sec; L 7sec    Time 6    Period Weeks    Status Revised      PT LONG TERM GOAL #4   Title Pt will improve 6 MWT >151ft to demonstrate improved cardiorespiratory fitness    Baseline 06/15/21: 1123ft (gait belt, no AD);   09/05/21: 1256ft (gait belt, no AD); 07/18/22: 1054ft (gait belt, no AD)   Time 7    Period Weeks    Status Deferred      PT LONG TERM GOAL #5   Title Pt to demonstrate 30sec chair rise >13 to indicate improved power in BLE.    Baseline 09/05/21: 9;  11/29/21: 10.5  07/18/22: 9.5   Time 8    Period Weeks    Status On-going      PT LONG TERM GOAL  #11   TITLE Pt will demonstrate L SLS of 21 sec in order to demonstrate symmetrical static balance of unaffected LE to decrease fall risk    Baseline 02/15/21 R 21 sec; L 7.73;  11/29/21 R 22sec  L 8sec  07/18/22: R: 19.83 sec L: 5.76 sec   Time 8    Period Weeks    Status On-going             PLAN  FOR NEXT SESSION: Continue with current POC and update exercises as necessary.  Assess HEP.   Nolon Bussing, PT, DPT Physical Therapist -  [facility]  08/01/22, 3:19 PM

## 2022-08-08 ENCOUNTER — Ambulatory Visit: Payer: 59

## 2022-08-08 DIAGNOSIS — Z9181 History of falling: Secondary | ICD-10-CM

## 2022-08-08 DIAGNOSIS — R262 Difficulty in walking, not elsewhere classified: Secondary | ICD-10-CM

## 2022-08-08 DIAGNOSIS — M25521 Pain in right elbow: Secondary | ICD-10-CM

## 2022-08-08 DIAGNOSIS — M25621 Stiffness of right elbow, not elsewhere classified: Secondary | ICD-10-CM

## 2022-08-08 DIAGNOSIS — M6281 Muscle weakness (generalized): Secondary | ICD-10-CM

## 2022-08-08 NOTE — Therapy (Signed)
OUTPATIENT PHYSICAL THERAPY TREATMENT NOTE   Patient Name: Michelle Ruiz MRN: [id number] DOB:07-Jul-1970, 52 y.o., female Today's Date: 08/08/2022  PCP: Nemiah Commander MD REFERRING PROVIDER: Sherryll Burger MD  END OF SESSION:  PT End of Session - 08/08/22 1518     Visit Number 53    Number of Visits 80    Date for PT Re-Evaluation 08/08/22    Authorization Type [employer] COMM Pro    Authorization Time Period 06/15/21-09/07/21    Authorization - Visit Number 53    Authorization - Number of Visits 60    Progress Note Due on Visit 60    PT Start Time 1518    PT Stop Time 1600    PT Time Calculation (min) 42 min    Activity Tolerance Patient tolerated treatment well    Behavior During Therapy WFL for tasks assessed/performed               Past Medical History:  Diagnosis Date   Abnormal Pap smear of cervix    Actinic keratosis    Anxiety    Basal cell carcinoma 04/14/2008   Right upper ant. thigh. BCC with sclerosis.   Basal cell carcinoma 08/14/2008   Right lat. lower thigh. Superficial.    Basal cell carcinoma 04/29/2019   Right mid forearm. Superficial and nodular patterns. EDC.   Basal cell carcinoma 08/17/2020   Right upper antecubital. EDC.   Basal cell carcinoma 08/23/2021   Left Lower Leg, EDc   Kidney stones    MS (multiple sclerosis) (HCC)    UTI (urinary tract infection)    Past Surgical History:  Procedure Laterality Date   BREAST BIOPSY Right 2010   stereotactic biopsy/ neg/ dr.byrnett   BREAST BIOPSY Left 2012   neg/dr. brynett   CESAREAN SECTION  2008   RPH   TONSILLECTOMY  2006   There are no problems to display for this patient.   REFERRING DIAG: Unsteadiness; difficulty walking  THERAPY DIAG:  Difficulty in walking, not elsewhere classified  Muscle weakness (generalized)  History of falling  Pain in right elbow  Stiffness of right elbow, not elsewhere classified  Rationale for Evaluation and Treatment Rehabilitation  PERTINENT HISTORY: Patient  reports history of falls x3 in the past 6 months secondary to drop foot on the L LE. PMH: Multiple sclerosis; elbow fracture of the R UE   PRECAUTIONS: Falls  SUBJECTIVE:   SUBJECTIVE STATEMENT:  Pt reports she is wearing a heart monitor currently.  Pt self-reports being stressed about her mother coming home from inpatient rehab.  Pt unsure if her mother is prepared to come home safely, so she is concerned for her well-being at this time.    PAIN:  Are you having pain? No   TODAY'S TREATMENT: DATE: 08/08/22   Therex:  Recumbent bike: 6 mins L1 1 min, L3 2 min, L5 2 min, L1 1 min Seated leg press at OMEGA 75#, x10 Seated leg extensions at OMEGA, 45#, 2x10 Seated hamstring curls at OMEGA, 35#, 2x10 Standing hip abduction at Matrix machine, 55#, x15 each  Standing hip extension at Matrix machine, 55#, x15 each  Standing hip flexion at Matrix machine, 55#, x15 each  Standing hip adduction at Matrix machine, 55#, x15 each     PATIENT EDUCATION: Education details: Patient was educated on diagnosis, anatomy and pathology involved, prognosis, role of PT, and was given an HEP, demonstrating exercise with proper form following verbal and tactile cues, and was given a paper hand out to continue  exercise at home. Pt was educated on and agreed to plan of care.  Person educated: Patient Education method: Explanation, Demonstration, and Verbal cues Education comprehension: verbalized understanding and returned demonstration  HOME EXERCISE PROGRAM: Walking program  CLINICAL IMPRESSION:  Pt continues to improve in strength as noted by the increased resistance during the hip exercises.  Pt self-reported the hip strengthening exercises have improved the pain she was experiencing.  Pt has made significant progress in pain and functional mobility over the past several treatments.  Pt does still likely have some balance complications and would benefit from continuing the strengthening  program while also incorporating balance related tasks moving forward.   Pt will continue to benefit from skilled therapy to address remaining deficits in order to improve overall QoL and return to PLOF.           PT Long Term Goals - 11/29/21 1519       PT LONG TERM GOAL #1   Title Patient will be independent with balance HEP to maximize safety in ADL, IADL, and fitness activity    Baseline walking only; 09/05/21 is walking for exercise    Time 4    Period Weeks    Status Achieved    Target Date 07/13/21      PT LONG TERM GOAL #2   Title Pt to demonstrate improvement in minibesTest >3 points to demonstrate improved posture control and reduced falls risk.    Baseline 09/05/21 20/28;  11/29/21 20/28 07/18/22: 22/28   Time 5    Period Weeks    Status PROGRESSING      PT LONG TERM GOAL #3   Title Pt to perform SLS >15 sec bilat to indicate improved single limb motor control in gait.    Baseline 02/25/2019: 14sec (retest deferred to visit 2); 09/05/21 R 20sec; L 7sec    Time 6    Period Weeks    Status Revised      PT LONG TERM GOAL #4   Title Pt will improve 6 MWT >1529ft to demonstrate improved cardiorespiratory fitness    Baseline 06/15/21: 1179ft (gait belt, no AD);   09/05/21: 1239ft (gait belt, no AD); 07/18/22: 1025ft (gait belt, no AD)   Time 7    Period Weeks    Status Deferred      PT LONG TERM GOAL #5   Title Pt to demonstrate 30sec chair rise >13 to indicate improved power in BLE.    Baseline 09/05/21: 9;  11/29/21: 10.5  07/18/22: 9.5   Time 8    Period Weeks    Status On-going      PT LONG TERM GOAL  #11   TITLE Pt will demonstrate L SLS of 21 sec in order to demonstrate symmetrical static balance of unaffected LE to decrease fall risk    Baseline 02/15/21 R 21 sec; L 7.73;  11/29/21 R 22sec  L 8sec  07/18/22: R: 19.83 sec L: 5.76 sec   Time 8    Period Weeks    Status On-going             PLAN FOR NEXT SESSION: Continue with current POC and update  exercises as necessary.  Assess HEP.   Nolon Bussing, PT, DPT Physical Therapist - [facility]  08/08/22, 6:12 PM

## 2022-08-15 ENCOUNTER — Ambulatory Visit: Payer: 59

## 2022-08-22 ENCOUNTER — Ambulatory Visit: Payer: 59

## 2022-08-29 ENCOUNTER — Ambulatory Visit: Payer: 59 | Attending: Neurology

## 2022-08-29 DIAGNOSIS — R262 Difficulty in walking, not elsewhere classified: Secondary | ICD-10-CM | POA: Diagnosis present

## 2022-08-29 DIAGNOSIS — Z9181 History of falling: Secondary | ICD-10-CM | POA: Diagnosis present

## 2022-08-29 DIAGNOSIS — M25521 Pain in right elbow: Secondary | ICD-10-CM | POA: Diagnosis present

## 2022-08-29 DIAGNOSIS — M6281 Muscle weakness (generalized): Secondary | ICD-10-CM | POA: Diagnosis present

## 2022-08-29 DIAGNOSIS — M25621 Stiffness of right elbow, not elsewhere classified: Secondary | ICD-10-CM | POA: Diagnosis present

## 2022-08-29 NOTE — Therapy (Signed)
OUTPATIENT PHYSICAL THERAPY TREATMENT NOTE   Patient Name: Michelle Ruiz MRN: [id number] DOB:[birth year], 52 y.o., female Today's Date: 08/29/2022  PCP: Nemiah Commander MD REFERRING PROVIDER: Sherryll Burger MD  END OF SESSION:  PT End of Session - 08/29/22 1520     Visit Number 54    Number of Visits 80    Date for PT Re-Evaluation 08/08/22    Authorization Type [employer] COMM Pro    Authorization Time Period 06/15/21-09/07/21    Authorization - Visit Number 54    Authorization - Number of Visits 60    Progress Note Due on Visit 60    PT Start Time 1517    PT Stop Time 1600    PT Time Calculation (min) 43 min    Activity Tolerance Patient tolerated treatment well    Behavior During Therapy WFL for tasks assessed/performed               Past Medical History:  Diagnosis Date   Abnormal Pap smear of cervix    Actinic keratosis    Anxiety    Basal cell carcinoma 04/14/2008   Right upper ant. thigh. BCC with sclerosis.   Basal cell carcinoma 08/14/2008   Right lat. lower thigh. Superficial.    Basal cell carcinoma 04/29/2019   Right mid forearm. Superficial and nodular patterns. EDC.   Basal cell carcinoma 08/17/2020   Right upper antecubital. EDC.   Basal cell carcinoma 08/23/2021   Left Lower Leg, EDc   Kidney stones    MS (multiple sclerosis) (HCC)    UTI (urinary tract infection)    Past Surgical History:  Procedure Laterality Date   BREAST BIOPSY Right 2010   stereotactic biopsy/ neg/ dr.byrnett   BREAST BIOPSY Left 2012   neg/dr. brynett   CESAREAN SECTION  2008   RPH   TONSILLECTOMY  2006   There are no problems to display for this patient.   REFERRING DIAG: Unsteadiness; difficulty walking  THERAPY DIAG:  Difficulty in walking, not elsewhere classified  Muscle weakness (generalized)  History of falling  Pain in right elbow  Stiffness of right elbow, not elsewhere classified  Rationale for Evaluation and Treatment Rehabilitation  PERTINENT HISTORY: Patient  reports history of falls x3 in the past 6 months secondary to drop foot on the L LE. PMH: Multiple sclerosis; elbow fracture of the R UE   PRECAUTIONS: Falls  SUBJECTIVE:   SUBJECTIVE STATEMENT:  Pt reports doing well since previous session. Has not been to PT in 3 weeks due to her mother's medical conditions. Reports resistance training has been extremely helpful for her.   PAIN:  Are you having pain? No   TODAY'S TREATMENT: DATE: 08/29/22   Therex:  Recumbent bike: 6 mins L5 for LE warm up  Seated leg press at OMEGA 75#, 2x10  Seated leg extensions at OMEGA, 45#, 2x10  Seated hamstring curls at OMEGA, 35#, 2x10  Standing hip abduction at Matrix machine, 55#, 2x10 each   Standing hip extension at Matrix machine, 55#, 2x10 each   Standing hip flexion at Matrix machine, 55#, 2x10 each   Standing hip adduction at Matrix machine, 55#, 2x10 each     PATIENT EDUCATION: Education details: Patient was educated on diagnosis, anatomy and pathology involved, prognosis, role of PT, and was given an HEP, demonstrating exercise with proper form following verbal and tactile cues, and was given a paper hand out to continue exercise at home. Pt was educated on and agreed to plan of care.  Person  educated: Patient Education method: Explanation, Demonstration, and Verbal cues Education comprehension: verbalized understanding and returned demonstration  HOME EXERCISE PROGRAM: Walking program  CLINICAL IMPRESSION: Pt returning to PT after ~3 week hiatus. Pt remains able to perform all resistance exercises at same resistance and actually greater volume than last session. Reviewed DOMS expectations after long hiatus from PT sessions. Pt understanding. Pt still working on long term goals with dynamic balance and strength impairments and will continue to benefit from skilled PT services to address remaining deficits in order to improve overall QoL and return to PLOF.         PT Long  Term Goals - 11/29/21 1519       PT LONG TERM GOAL #1   Title Patient will be independent with balance HEP to maximize safety in ADL, IADL, and fitness activity    Baseline walking only; 09/05/21 is walking for exercise    Time 4    Period Weeks    Status Achieved    Target Date 07/13/21      PT LONG TERM GOAL #2   Title Pt to demonstrate improvement in minibesTest >3 points to demonstrate improved posture control and reduced falls risk.    Baseline 09/05/21 20/28;  11/29/21 20/28 07/18/22: 22/28   Time 5    Period Weeks    Status PROGRESSING      PT LONG TERM GOAL #3   Title Pt to perform SLS >15 sec bilat to indicate improved single limb motor control in gait.    Baseline 02/25/2019: 14sec (retest deferred to visit 2); 09/05/21 R 20sec; L 7sec    Time 6    Period Weeks    Status Revised      PT LONG TERM GOAL #4   Title Pt will improve 6 MWT >1561ft to demonstrate improved cardiorespiratory fitness    Baseline 06/15/21: 1153ft (gait belt, no AD);   09/05/21: 1244ft (gait belt, no AD); 07/18/22: 109ft (gait belt, no AD)   Time 7    Period Weeks    Status Deferred      PT LONG TERM GOAL #5   Title Pt to demonstrate 30sec chair rise >13 to indicate improved power in BLE.    Baseline 09/05/21: 9;  11/29/21: 10.5  07/18/22: 9.5   Time 8    Period Weeks    Status On-going      PT LONG TERM GOAL  #11   TITLE Pt will demonstrate L SLS of 21 sec in order to demonstrate symmetrical static balance of unaffected LE to decrease fall risk    Baseline 02/15/21 R 21 sec; L 7.73;  11/29/21 R 22sec  L 8sec  07/18/22: R: 19.83 sec L: 5.76 sec   Time 8    Period Weeks    Status On-going             PLAN FOR NEXT SESSION: Continue with current POC and update exercises as necessary.  Assess HEP.   Delphia Grates. Fairly IV, PT, DPT Physical Therapist- [facility]   08/29/22, 4:09 PM

## 2022-09-05 ENCOUNTER — Ambulatory Visit: Payer: 59

## 2022-09-05 DIAGNOSIS — M6281 Muscle weakness (generalized): Secondary | ICD-10-CM

## 2022-09-05 DIAGNOSIS — R262 Difficulty in walking, not elsewhere classified: Secondary | ICD-10-CM

## 2022-09-05 DIAGNOSIS — Z9181 History of falling: Secondary | ICD-10-CM

## 2022-09-05 NOTE — Therapy (Signed)
OUTPATIENT PHYSICAL THERAPY RECERTIFICATION   Patient Name: Michelle Ruiz MRN: [id number] DOB:Dec 11, 1970, 53 y.o., female Today's Date: 09/05/2022  PCP: Nemiah Commander MD REFERRING PROVIDER: Sherryll Burger MD  END OF SESSION:  PT End of Session - 09/05/22 1609     Visit Number 55    Number of Visits 80    Date for PT Re-Evaluation 11/28/22    Authorization Type [employer] COMM Pro    Authorization Time Period 06/15/21-09/07/21    Authorization - Number of Visits 60    Progress Note Due on Visit 60    PT Start Time 1600    PT Stop Time 1645    PT Time Calculation (min) 45 min    Equipment Utilized During Treatment Gait belt    Activity Tolerance Patient tolerated treatment well    Behavior During Therapy WFL for tasks assessed/performed               Past Medical History:  Diagnosis Date   Abnormal Pap smear of cervix    Actinic keratosis    Anxiety    Basal cell carcinoma 04/14/2008   Right upper ant. thigh. BCC with sclerosis.   Basal cell carcinoma 08/14/2008   Right lat. lower thigh. Superficial.    Basal cell carcinoma 04/29/2019   Right mid forearm. Superficial and nodular patterns. EDC.   Basal cell carcinoma 08/17/2020   Right upper antecubital. EDC.   Basal cell carcinoma 08/23/2021   Left Lower Leg, EDc   Kidney stones    MS (multiple sclerosis) (HCC)    UTI (urinary tract infection)    Past Surgical History:  Procedure Laterality Date   BREAST BIOPSY Right 2010   stereotactic biopsy/ neg/ dr.byrnett   BREAST BIOPSY Left 2012   neg/dr. brynett   CESAREAN SECTION  2008   RPH   TONSILLECTOMY  2006   There are no problems to display for this patient.   REFERRING DIAG: Unsteadiness; difficulty walking  THERAPY DIAG:  Difficulty in walking, not elsewhere classified  Muscle weakness (generalized)  History of falling  Rationale for Evaluation and Treatment Rehabilitation  PERTINENT HISTORY: Patient reports history of falls x3 in the past 6 months secondary to  drop foot on the L LE. PMH: Multiple sclerosis; elbow fracture of the R UE   PRECAUTIONS: Falls  SUBJECTIVE:   SUBJECTIVE STATEMENT:  Pt denies falls. Minor hip pain but overall has been doing well.   PAIN:  Are you having pain? No  Objective:  TODAY'S TREATMENT: DATE: 09/05/22   Therex:  Recumbent bike: 3 mins L5 for LE warm up  Reviewed remaining goals as pt at end of current POC. See clinical impression and goals section for details.  PATIENT EDUCATION: Education details: Patient was educated on diagnosis, anatomy and pathology involved, prognosis, role of PT, and was given an HEP, demonstrating exercise with proper form following verbal and tactile cues, and was given a paper hand out to continue exercise at home. Pt was educated on and agreed to plan of care.  Person educated: Patient Education method: Explanation, Demonstration, and Verbal cues Education comprehension: verbalized understanding and returned demonstration  HOME EXERCISE PROGRAM: Walking program  CLINICAL IMPRESSION: Pt at end of current POC thus requiring re-certification. Pt displaying progress with LLE SLS time, improvements in miniBesTest, and 30 sec STS test. Pt did display decline in this date but this could be skewed result due to fatigue from STS test and balance testing. Pt did have x2 significant anterior LOB in 6  MWT thus reliant on maxA from PT to prevent falls as LLE remains limited in strength/endurance with prolonged gait leading to poor LLE foot clearance. Although pt is making progress towards most of her goals, she has yet to achieve them and remains a high falls risk in the setting of a progressive neuromuscular disease. PT to plan for additional PT 1x/week for 12 weeks to reduce falls risk/serious injury and maximize functional mobility outcomes.      PT Long Term Goals - 11/29/21 1519       PT LONG TERM GOAL #1   Title Patient will be independent with balance HEP to maximize  safety in ADL, IADL, and fitness activity    Baseline walking only; 09/05/21 is walking for exercise    Time 4    Period Weeks    Status Achieved    Target Date 07/13/21      PT LONG TERM GOAL #2   Title Pt to demonstrate improvement in minibesTest >3 points to demonstrate improved posture control and reduced falls risk.    Baseline 09/05/21 20/28;  11/29/21 20/28 07/18/22: 22/28 09/05/2022: 23/28   Time 12   Period Weeks    Status PROGRESSING                               PT LONG TERM GOAL #3   Title Pt will improve 6 MWT >1539ft to demonstrate improved cardiorespiratory fitness    Baseline 06/15/21: 1132ft (gait belt, no AD);   09/05/21: 1235ft (gait belt, no AD); 07/18/22: 1026ft (gait belt, no AD) 09/05/22: 960 ft x2 LOB (gait belt, no AD)   Time 12   Period Weeks    Status Deferred      PT LONG TERM GOAL #4   Title Pt to demonstrate 30sec chair rise >13 to indicate improved power in BLE.    Baseline 09/05/21: 9;  11/29/21: 10.5  07/18/22: 9.5 09/05/22: 11   Time 12   Period Weeks    Status On-going      PT LONG TERM GOAL  #5   TITLE Pt will demonstrate L SLS of 21 sec in order to demonstrate symmetrical static balance of unaffected LE to decrease fall risk    Baseline 02/15/21 R 21 sec; L 7.73;  11/29/21 R 22sec  L 8sec  07/18/22: R: 19.83 sec L: 5.76 sec 09/05/22: R: 26.75 sec, L: 10.38 sec    Time 12   Period Weeks    Status On-going             PLAN FOR NEXT SESSION: SLS activities, walking endurance    Kalany Diekmann M. Fairly IV, PT, DPT Physical Therapist- [facility]   09/05/22, 8:49 PM

## 2022-09-12 ENCOUNTER — Encounter: Payer: Self-pay | Admitting: Dermatology

## 2022-09-12 ENCOUNTER — Ambulatory Visit: Payer: 59 | Admitting: Dermatology

## 2022-09-12 VITALS — BP 128/74 | HR 73

## 2022-09-12 DIAGNOSIS — D2272 Melanocytic nevi of left lower limb, including hip: Secondary | ICD-10-CM

## 2022-09-12 DIAGNOSIS — L738 Other specified follicular disorders: Secondary | ICD-10-CM

## 2022-09-12 DIAGNOSIS — Z1283 Encounter for screening for malignant neoplasm of skin: Secondary | ICD-10-CM

## 2022-09-12 DIAGNOSIS — D225 Melanocytic nevi of trunk: Secondary | ICD-10-CM | POA: Diagnosis not present

## 2022-09-12 DIAGNOSIS — L821 Other seborrheic keratosis: Secondary | ICD-10-CM

## 2022-09-12 DIAGNOSIS — L820 Inflamed seborrheic keratosis: Secondary | ICD-10-CM

## 2022-09-12 DIAGNOSIS — D485 Neoplasm of uncertain behavior of skin: Secondary | ICD-10-CM

## 2022-09-12 DIAGNOSIS — L578 Other skin changes due to chronic exposure to nonionizing radiation: Secondary | ICD-10-CM

## 2022-09-12 DIAGNOSIS — L700 Acne vulgaris: Secondary | ICD-10-CM | POA: Diagnosis not present

## 2022-09-12 DIAGNOSIS — D2271 Melanocytic nevi of right lower limb, including hip: Secondary | ICD-10-CM

## 2022-09-12 DIAGNOSIS — Z85828 Personal history of other malignant neoplasm of skin: Secondary | ICD-10-CM

## 2022-09-12 DIAGNOSIS — L814 Other melanin hyperpigmentation: Secondary | ICD-10-CM | POA: Diagnosis not present

## 2022-09-12 DIAGNOSIS — D229 Melanocytic nevi, unspecified: Secondary | ICD-10-CM

## 2022-09-12 DIAGNOSIS — D2262 Melanocytic nevi of left upper limb, including shoulder: Secondary | ICD-10-CM

## 2022-09-12 DIAGNOSIS — W908XXA Exposure to other nonionizing radiation, initial encounter: Secondary | ICD-10-CM

## 2022-09-12 NOTE — Progress Notes (Signed)
Follow-Up Visit   Subjective  Michelle Ruiz is a 52 y.o. female who presents for the following: Skin Cancer Screening and Full Body Skin Exam  The patient presents for Total-Body Skin Exam (TBSE) for skin cancer screening and mole check. The patient has spots, moles and lesions to be evaluated, some may be new or changing. She has a history of multiple BCCs. Patient has recent breakouts on her face and neck, painful on neck- scratches at. She has generic Duac gel that she has used in the past. History of PDT to the chest 12/29/2021.   The following portions of the chart were reviewed this encounter and updated as appropriate: medications, allergies, medical history  Review of Systems:  No other skin or systemic complaints except as noted in HPI or Assessment and Plan.  Objective  Well appearing patient in no apparent distress; mood and affect are within normal limits.  A full examination was performed including scalp, head, eyes, ears, nose, lips, neck, chest, axillae, abdomen, back, buttocks, bilateral upper extremities, bilateral lower extremities, hands, feet, fingers, toes, fingernails, and toenails. All findings within normal limits unless otherwise noted below.   Relevant physical exam findings are noted in the Assessment and Plan.      L posterior upper neck x 4; R posterior upper neck x 1 Erythematous stuck-on, waxy papule or plaque  Left shoulder superior 5.0 mm two-toned brown macule     Left shoulder inferior 5.0 mm two-toned brown macule         Assessment & Plan   LENTIGINES, SEBORRHEIC KERATOSES, HEMANGIOMAS - Benign normal skin lesions - Benign-appearing - Call for any changes  MELANOCYTIC NEVI - Tan-brown and/or pink-flesh-colored symmetric macules and papules  - 5 x 3 mm medium brown macule at left mid back - 4 mm brown macule with darker center at left shoulder - 2.5 mm med dark brown macule at right medial knee - 4 mm speckled brown macule  at left lateral heel  - Benign appearing on exam today - Observation - Call clinic for new or changing moles - Recommend daily use of broad spectrum spf 30+ sunscreen to sun-exposed areas.   ACTINIC DAMAGE - Chronic condition, secondary to cumulative UV/sun exposure - diffuse scaly erythematous macules with underlying dyspigmentation - Recommend daily broad spectrum sunscreen SPF 30+ to sun-exposed areas, reapply every 2 hours as needed.  - Staying in the shade or wearing long sleeves, sun glasses (UVA+UVB protection) and wide brim hats (4-inch brim around the entire circumference of the hat) are also recommended for sun protection.  - Call for new or changing lesions.  SKIN CANCER SCREENING PERFORMED TODAY.  HISTORY OF BASAL CELL CARCINOMA OF THE SKIN - No evidence of recurrence today - Recommend regular full body skin exams - Recommend daily broad spectrum sunscreen SPF 30+ to sun-exposed areas, reapply every 2 hours as needed.  - Call if any new or changing lesions are noted between office visits  Sebaceous Hyperplasia - 3.0 mm small yellow white lobulated papule R nasal dorsum, stable compared to previous exam. - Benign-appearing - Observe. Call for changes.  ACNE VULGARIS Exam: Resolving inflammatory cystic papule of the glabella; closed comedones on the cheeks.  Chronic and persistent condition with duration or expected duration over one year. Condition is bothersome/symptomatic for patient. Currently flared.   Treatment Plan: Restart Duac Gel QAM to face for acne.  Benzoyl peroxide can cause dryness and irritation of the skin. It can also bleach fabric. When used  together with Aczone (dapsone) cream, it can stain the skin orange.  Discussed Spironolactone treatment. Patient has appt with her PCP next week and will discuss then since on other BP meds. If ok with PCP, will start Spironolactone 100 MG 1 po every day (starting with 1/2 tablet for 1-2 weeks).  Spironolactone  can cause increased urination and cause blood pressure to decrease. Please watch for signs of lightheadedness and be cautious when changing position. It can sometimes cause breast tenderness or an irregular period in premenopausal women. It can also increase potassium. The increase in potassium usually is not a concern unless you are taking other medicines that also increase potassium, so please be sure your doctor knows all of the other medications you are taking. This medication should not be taken by pregnant women.  This medicine should also not be taken together with sulfa drugs like Bactrim (trimethoprim/sulfamethexazole).    Inflamed seborrheic keratosis L posterior upper neck x 4; R posterior upper neck x 1  vs Acne.  Symptomatic, irritating, patient would like treated.  May try to spot treat new areas with Duac Gel.  Destruction of lesion - L posterior upper neck x 4; R posterior upper neck x 1  Destruction method: cryotherapy   Informed consent: discussed and consent obtained   Lesion destroyed using liquid nitrogen: Yes   Region frozen until ice ball extended beyond lesion: Yes   Outcome: patient tolerated procedure well with no complications   Post-procedure details: wound care instructions given   Additional details:  Prior to procedure, discussed risks of blister formation, small wound, skin dyspigmentation, or rare scar following cryotherapy. Recommend Vaseline ointment to treated areas while healing.   Neoplasm of uncertain behavior of skin (2) Left shoulder superior  Epidermal / dermal shaving  Lesion diameter (cm):  0.6 Informed consent: discussed and consent obtained   Patient was prepped and draped in usual sterile fashion: Area prepped with alcohol. Anesthesia: the lesion was anesthetized in a standard fashion   Anesthetic:  1% lidocaine w/ epinephrine 1-100,000 buffered w/ 8.4% NaHCO3 Instrument used: flexible razor blade   Hemostasis achieved with: pressure,  aluminum chloride and electrodesiccation   Outcome: patient tolerated procedure well   Post-procedure details: wound care instructions given   Post-procedure details comment:  Ointment and small bandage applied  Specimen 1 - Surgical pathology Differential Diagnosis: SK vs Lentigo r/o atypia Check Margins: Yes  Left shoulder inferior  Epidermal / dermal shaving  Lesion diameter (cm):  0.6 Informed consent: discussed and consent obtained   Patient was prepped and draped in usual sterile fashion: Area prepped with alcohol. Anesthesia: the lesion was anesthetized in a standard fashion   Anesthetic:  1% lidocaine w/ epinephrine 1-100,000 buffered w/ 8.4% NaHCO3 Instrument used: flexible razor blade   Hemostasis achieved with: pressure, aluminum chloride and electrodesiccation   Outcome: patient tolerated procedure well   Post-procedure details: wound care instructions given   Post-procedure details comment:  Ointment and small bandage applied  Specimen 2 - Surgical pathology Differential Diagnosis: SK vs Lentigo r/o atypia Check Margins: No   Return in about 6 months (around 03/14/2023) for TBSE, Hx BCC, Hx AKs. Also blue light to chest with debridement. Wendee Beavers, CMA, am acting as scribe for Willeen Niece, MD .   Documentation: I have reviewed the above documentation for accuracy and completeness, and I agree with the above.  Willeen Niece, MD

## 2022-09-12 NOTE — Patient Instructions (Addendum)
Spironolactone can cause increased urination and cause blood pressure to decrease. Please watch for signs of lightheadedness and be cautious when changing position. It can sometimes cause breast tenderness or an irregular period in premenopausal women. It can also increase potassium. The increase in potassium usually is not a concern unless you are taking other medicines that also increase potassium, so please be sure your doctor knows all of the other medications you are taking. This medication should not be taken by pregnant women.  This medicine should also not be taken together with sulfa drugs like Bactrim (trimethoprim/sulfamethexazole).   Restart Duac Gel  (clindamycin-benzoyl peroxide gel) - Apply to face every morning for acne. May also try to spot treat new areas on the back of neck.   Benzoyl peroxide can cause dryness and irritation of the skin. It can also bleach fabric. When used together with Aczone (dapsone) cream, it can stain the skin orange.   Cryotherapy Aftercare  Wash gently with soap and water everyday.   Apply Vaseline and Band-Aid daily until healed.    Wound Care Instructions  Cleanse wound gently with soap and water once a day then pat dry with clean gauze. Apply a thin coat of Petrolatum (petroleum jelly, "Vaseline") over the wound (unless you have an allergy to this). We recommend that you use a new, sterile tube of Vaseline. Do not pick or remove scabs. Do not remove the yellow or white "healing tissue" from the base of the wound.  Cover the wound with fresh, clean, nonstick gauze and secure with paper tape. You may use Band-Aids in place of gauze and tape if the wound is small enough, but would recommend trimming much of the tape off as there is often too much. Sometimes Band-Aids can irritate the skin.  You should call the office for your biopsy report after 1 week if you have not already been contacted.  If you experience any problems, such as abnormal amounts of  bleeding, swelling, significant bruising, significant pain, or evidence of infection, please call the office immediately.  FOR ADULT SURGERY PATIENTS: If you need something for pain relief you may take 1 extra strength Tylenol (acetaminophen) AND 2 Ibuprofen (200mg  each) together every 4 hours as needed for pain. (do not take these if you are allergic to them or if you have a reason you should not take them.) Typically, you may only need pain medication for 1 to 3 days.    Due to recent changes in healthcare laws, you may see results of your pathology and/or laboratory studies on MyChart before the doctors have had a chance to review them. We understand that in some cases there may be results that are confusing or concerning to you. Please understand that not all results are received at the same time and often the doctors may need to interpret multiple results in order to provide you with the best plan of care or course of treatment. Therefore, we ask that you please give [place] 2 business days to thoroughly review all your results before contacting the office for clarification. Should we see a critical lab result, you will be contacted sooner.   If You Need Anything After Your Visit  If you have any questions or concerns for your doctor, please call our main line at [phone] and press option 4 to reach your doctor's medical assistant. If no one answers, please leave a voicemail as directed and we will return your call as soon as possible. Messages left after 4  pm will be answered the following business day.   You may also send [place] a message via [employer]. We typically respond to MyChart messages within 1-2 business days.  For prescription refills, please ask your pharmacy to contact our office. Our fax number is [phone].  If you have an urgent issue when the clinic is closed that cannot wait until the next business day, you can page your doctor at the number below.    Please note that while we  do our best to be available for urgent issues outside of office hours, we are not available 24/7.   If you have an urgent issue and are unable to reach [place], you may choose to seek medical care at your doctor's office, retail clinic, urgent care center, or emergency room.  If you have a medical emergency, please immediately call 911 or go to the emergency department.  Pager Numbers  - Dr. Nehemiah Massed: [phone]  - Dr. Laurence Ferrari: [phone]  - Dr. Nicole Kindred: [phone]  In the event of inclement weather, please call our main line at [phone] for an update on the status of any delays or closures.  Dermatology Medication Tips: Please keep the boxes that topical medications come in in order to help keep track of the instructions about where and how to use these. Pharmacies typically print the medication instructions only on the boxes and not directly on the medication tubes.   If your medication is too expensive, please contact our office at [phone] option 4 or send [place] a message through [employer].   We are unable to tell what your co-pay for medications will be in advance as this is different depending on your insurance coverage. However, we may be able to find a substitute medication at lower cost or fill out paperwork to get insurance to cover a needed medication.   If a prior authorization is required to get your medication covered by your insurance company, please allow [place] 1-2 business days to complete this process.  Drug prices often vary depending on where the prescription is filled and some pharmacies may offer cheaper prices.  The website www.goodrx.com contains coupons for medications through different pharmacies. The prices here do not account for what the cost may be with help from insurance (it may be cheaper with your insurance), but the website can give you the price if you did not use any insurance.  - You can print the associated coupon and take it with your prescription to  the pharmacy.  - You may also stop by our office during regular business hours and pick up a GoodRx coupon card.  - If you need your prescription sent electronically to a different pharmacy, notify our office through [facility] or by phone at [phone] option 4.     Si Usted Necesita Algo Despus de Su Visita  Tambin puede enviarnos un mensaje a travs de Pharmacist, community. Por lo general respondemos a los mensajes de MyChart en el transcurso de 1 a 2 das hbiles.  Para renovar recetas, por favor pida a su farmacia que se ponga en contacto con nuestra oficina. Harland Dingwall de fax es [place] [phone].  Si tiene un asunto urgente cuando la clnica est cerrada y que no puede esperar hasta el siguiente da hbil, puede llamar/localizar a su doctor(a) al nmero que aparece a continuacin.   Por favor, tenga en cuenta que aunque hacemos todo lo posible para estar disponibles para asuntos urgentes fuera del horario de oficina, no estamos disponibles las 24  horas del Training and development officer, los 7 das de la [place].   Si tiene un problema urgente y no puede comunicarse con nosotros, puede optar por buscar atencin mdica  en el consultorio de su doctor(a), en una clnica privada, en un centro de atencin urgente o en una sala de emergencias.  Si tiene Engineering geologist, por favor llame inmediatamente al 911 o vaya a la sala de emergencias.  Nmeros de bper  - Dr. Nehemiah Massed: [phone]  - Dra. Moye: [phone]  - Dra. Nicole Kindred: [phone]  En caso de inclemencias del [place], por favor llame a Johnsie Kindred principal al [phone] para una actualizacin sobre el [place] de cualquier retraso o cierre.  Consejos para la medicacin en dermatologa: Por favor, guarde las cajas en las que vienen los medicamentos de uso tpico para ayudarle a seguir las instrucciones sobre dnde y cmo usarlos. Las farmacias generalmente imprimen las instrucciones del medicamento slo en las cajas y no directamente en  los tubos del [place].   Si su medicamento es muy caro, por favor, pngase en contacto con Zigmund Daniel llamando al [phone] y presione la opcin 4 o envenos un mensaje a travs de Pharmacist, community.   No podemos decirle cul ser su copago por los medicamentos por adelantado ya que esto es diferente dependiendo de la cobertura de su seguro. Sin embargo, es posible que podamos encontrar un medicamento sustituto a Electrical engineer un formulario para que el seguro cubra el medicamento que se considera necesario.   Si se requiere una autorizacin previa para que su compaa de seguros [place] su medicamento, por favor permtanos de 1 a 2 das hbiles para completar este proceso.  Los precios de los medicamentos varan con frecuencia dependiendo del Environmental consultant de dnde se surte la receta y alguna farmacias pueden ofrecer precios ms baratos.  El sitio web www.goodrx.com tiene cupones para medicamentos de Airline pilot. Los precios aqu no tienen en cuenta lo que podra costar con la ayuda del seguro (puede ser ms barato con su seguro), pero el sitio web puede darle el precio si no utiliz Research scientist (physical sciences).  - Puede imprimir el cupn correspondiente y llevarlo con su receta a la farmacia.  - Tambin puede pasar por nuestra oficina durante el horario de atencin regular y Charity fundraiser una tarjeta de cupones de GoodRx.  - Si necesita que su receta se enve electrnicamente a una farmacia diferente, informe a nuestra oficina a travs de MyChart de [place] o por telfono llamando al [phone] y presione la opcin 4.

## 2022-09-18 ENCOUNTER — Telehealth: Payer: Self-pay

## 2022-09-18 NOTE — Telephone Encounter (Signed)
Advised pt of bx results/sh ?

## 2022-09-18 NOTE — Telephone Encounter (Signed)
-----   Message from Willeen Niece, MD sent at 09/18/2022  1:31 PM EDT ----- 1. Skin , left shoulder superior PIGMENTED SEBORRHEIC KERATOSIS 2. Skin , left shoulder inferior PIGMENTED SEBORRHEIC KERATOSIS  1 and 2. Benign SK   - please call patient

## 2022-09-20 ENCOUNTER — Ambulatory Visit: Payer: 59

## 2022-09-20 DIAGNOSIS — R262 Difficulty in walking, not elsewhere classified: Secondary | ICD-10-CM | POA: Diagnosis not present

## 2022-09-20 DIAGNOSIS — Z9181 History of falling: Secondary | ICD-10-CM

## 2022-09-20 DIAGNOSIS — M6281 Muscle weakness (generalized): Secondary | ICD-10-CM

## 2022-09-20 NOTE — Therapy (Signed)
OUTPATIENT PHYSICAL THERAPY TREATMENT    Patient Name: Michelle Ruiz MRN: [id number] DOB:[birth year], 52 y.o., female Today's Date: 09/20/2022  PCP: Nemiah Commander MD REFERRING PROVIDER: Sherryll Burger MD  END OF SESSION:  PT End of Session - 09/20/22 1609     Visit Number 56    Number of Visits 80    Date for PT Re-Evaluation 11/28/22    Authorization Type BCBS COMM Pro    Authorization Time Period 06/15/21-09/07/21    Authorization - Number of Visits 60    Progress Note Due on Visit 60    PT Start Time 1602    PT Stop Time 1645    PT Time Calculation (min) 43 min    Equipment Utilized During Treatment Gait belt    Activity Tolerance Patient tolerated treatment well    Behavior During Therapy WFL for tasks assessed/performed               Past Medical History:  Diagnosis Date   Abnormal Pap smear of cervix    Actinic keratosis    Anxiety    Basal cell carcinoma 04/14/2008   Right upper ant. thigh. BCC with sclerosis.   Basal cell carcinoma 08/14/2008   Right lat. lower thigh. Superficial.    Basal cell carcinoma 04/29/2019   Right mid forearm. Superficial and nodular patterns. EDC.   Basal cell carcinoma 08/17/2020   Right upper antecubital. EDC.   Basal cell carcinoma 08/23/2021   Left Lower Leg, EDC   Kidney stones    MS (multiple sclerosis) (HCC)    UTI (urinary tract infection)    Past Surgical History:  Procedure Laterality Date   BREAST BIOPSY Right 2010   stereotactic biopsy/ neg/ dr.byrnett   BREAST BIOPSY Left 2012   neg/dr. brynett   CESAREAN SECTION  2008   RPH   TONSILLECTOMY  2006   There are no problems to display for this patient.   REFERRING DIAG: Unsteadiness; difficulty walking  THERAPY DIAG:  Difficulty in walking, not elsewhere classified  Muscle weakness (generalized)  History of falling  Rationale for Evaluation and Treatment Rehabilitation  PERTINENT HISTORY: Patient reports history of falls x3 in the past 6 months secondary to drop  foot on the L LE. PMH: Multiple sclerosis; elbow fracture of the R UE   PRECAUTIONS: Falls  SUBJECTIVE:   SUBJECTIVE STATEMENT:  Pt reports difficulty with walking due to the heat. One LOB since last visit but no falls.   PAIN:  Are you having pain? No  Objective:   TODAY'S TREATMENT: DATE: 09/20/22   Therex:  Recumbent bike: 6 mins L5 for LE warm up STS: 2x8 with 4# med ball   Neuro Re-Ed:   Obstacle courses:  x6 hurdle step overs --> 6" step up and over --> airex pad step up and over, CGA x4 laps.    Ambulating over unstable surfaces with x5 hedgehog balls under neath --> side stepping on airex beam --> airex pad step up and over, CGA 4 laps.   SLS with 2 # med ball tosses on rebounder: 2x10/LE under BOS. CGA    Tandem stance on BOSU ball no UE support. CGA, 1x30 sec/LE under BOS  PATIENT EDUCATION: Education details: Patient was educated on diagnosis, anatomy and pathology involved, prognosis, role of PT, and was given an HEP, demonstrating exercise with proper form following verbal and tactile cues, and was given a paper hand out to continue exercise at home. Pt was educated on and agreed to plan of care.  Person educated: Patient Education method: Explanation, Demonstration, and Verbal cues Education comprehension: verbalized understanding and returned demonstration  HOME EXERCISE PROGRAM: Walking program  CLINICAL IMPRESSION: Session mostly focused on dynamic balance as pt has MS related fatigue due to heat intolerance. Regular seated rest breaks provided as needed for pt. Pt remains greatly limited in her SLS to complete dynamic and static balance tasks needing tandem stance to challenge her balance. Encouraged pt to use SPC in community if needed to reduce falls risk due to heat. Pt will benefit from skilled PT services to reduce falls risk/serious injury and maximize functional mobility outcomes.     PT Long Term Goals - 11/29/21 1519       PT LONG TERM  GOAL #1   Title Patient will be independent with balance HEP to maximize safety in ADL, IADL, and fitness activity    Baseline walking only; 09/05/21 is walking for exercise    Time 4    Period Weeks    Status Achieved    Target Date 07/13/21      PT LONG TERM GOAL #2   Title Pt to demonstrate improvement in minibesTest >3 points to demonstrate improved posture control and reduced falls risk.    Baseline 09/05/21 20/28;  11/29/21 20/28 07/18/22: 22/28 09/05/2022: 23/28   Time 12   Period Weeks    Status PROGRESSING                               PT LONG TERM GOAL #3   Title Pt will improve 6 MWT >1513ft to demonstrate improved cardiorespiratory fitness    Baseline 06/15/21: 1136ft (gait belt, no AD);   09/05/21: 1272ft (gait belt, no AD); 07/18/22: 1055ft (gait belt, no AD) 09/05/22: 960 ft x2 LOB (gait belt, no AD)   Time 12   Period Weeks    Status Deferred      PT LONG TERM GOAL #4   Title Pt to demonstrate 30sec chair rise >13 to indicate improved power in BLE.    Baseline 09/05/21: 9;  11/29/21: 10.5  07/18/22: 9.5 09/05/22: 11   Time 12   Period Weeks    Status On-going      PT LONG TERM GOAL  #5   TITLE Pt will demonstrate L SLS of 21 sec in order to demonstrate symmetrical static balance of unaffected LE to decrease fall risk    Baseline 02/15/21 R 21 sec; L 7.73;  11/29/21 R 22sec  L 8sec  07/18/22: R: 19.83 sec L: 5.76 sec 09/05/22: R: 26.75 sec, L: 10.38 sec    Time 12   Period Weeks    Status On-going             PLAN FOR NEXT SESSION: SLS activities, walking endurance    Brok Stocking M. Fairly IV, PT, DPT Physical Therapist- [place]  [facility]   09/20/22, 4:52 PM

## 2022-09-21 ENCOUNTER — Ambulatory Visit
Admission: RE | Admit: 2022-09-21 | Discharge: 2022-09-21 | Disposition: A | Discharge status: Home / Self Care | Payer: 59 | Source: Ambulatory Visit | Attending: Obstetrics and Gynecology | Admitting: Obstetrics and Gynecology

## 2022-09-21 DIAGNOSIS — Z1231 Encounter for screening mammogram for malignant neoplasm of breast: Secondary | ICD-10-CM

## 2022-09-25 ENCOUNTER — Encounter: Payer: Self-pay | Admitting: Dermatology

## 2022-09-26 ENCOUNTER — Ambulatory Visit: Payer: 59 | Attending: Neurology

## 2022-09-26 ENCOUNTER — Other Ambulatory Visit: Payer: Self-pay

## 2022-09-26 DIAGNOSIS — Z9181 History of falling: Secondary | ICD-10-CM | POA: Insufficient documentation

## 2022-09-26 DIAGNOSIS — M6281 Muscle weakness (generalized): Secondary | ICD-10-CM | POA: Insufficient documentation

## 2022-09-26 DIAGNOSIS — R262 Difficulty in walking, not elsewhere classified: Secondary | ICD-10-CM | POA: Diagnosis present

## 2022-09-26 MED ORDER — SPIRONOLACTONE 100 MG PO TABS: 100.0000 mg | ORAL_TABLET | Freq: Every day | ORAL | 5 refills | Status: AC

## 2022-09-26 NOTE — Therapy (Signed)
OUTPATIENT PHYSICAL THERAPY TREATMENT    Patient Name: Michelle Ruiz MRN: [id number] DOB:April 14, 1970, 52 y.o., female Today's Date: 09/26/2022  PCP: Nemiah Commander MD REFERRING PROVIDER: Sherryll Burger MD  END OF SESSION:  PT End of Session - 09/26/22 1603     Visit Number 57    Number of Visits 80    Date for PT Re-Evaluation 11/28/22    Authorization Type BCBS COMM Pro    Authorization Time Period 06/15/21-09/07/21    Authorization - Number of Visits 60    Progress Note Due on Visit 60    PT Start Time 1601    PT Stop Time 1645    PT Time Calculation (min) 44 min    Equipment Utilized During Treatment Gait belt    Activity Tolerance Patient tolerated treatment well    Behavior During Therapy WFL for tasks assessed/performed               Past Medical History:  Diagnosis Date   Abnormal Pap smear of cervix    Actinic keratosis    Anxiety    Basal cell carcinoma 04/14/2008   Right upper ant. thigh. BCC with sclerosis.   Basal cell carcinoma 08/14/2008   Right lat. lower thigh. Superficial.    Basal cell carcinoma 04/29/2019   Right mid forearm. Superficial and nodular patterns. EDC.   Basal cell carcinoma 08/17/2020   Right upper antecubital. EDC.   Basal cell carcinoma 08/23/2021   Left Lower Leg, EDC   Kidney stones    MS (multiple sclerosis) (HCC)    UTI (urinary tract infection)    Past Surgical History:  Procedure Laterality Date   BREAST BIOPSY Right 2010   stereotactic biopsy/ neg/ dr.byrnett   BREAST BIOPSY Left 2012   neg/dr. brynett   CESAREAN SECTION  2008   RPH   TONSILLECTOMY  2006   There are no problems to display for this patient.   REFERRING DIAG: Unsteadiness; difficulty walking  THERAPY DIAG:  Difficulty in walking, not elsewhere classified  Muscle weakness (generalized)  History of falling  Rationale for Evaluation and Treatment Rehabilitation  PERTINENT HISTORY: Patient reports history of falls x3 in the past 6 months secondary to drop  foot on the L LE. PMH: Multiple sclerosis; elbow fracture of the R UE   PRECAUTIONS: Falls  SUBJECTIVE:   SUBJECTIVE STATEMENT:  Pt reports doing well overall. Denies falls.   PAIN:  Are you having pain? No  Objective:   TODAY'S TREATMENT: DATE: 09/26/22   Therex: Recumbent bike: 5 mins L6 for LE warm up  Sled pushes: 50#, 4x10 meters with seated rest after 2 reps.   Neuro Re-Ed:   Obstacle courses:  x6 hurdle step overs --> 6" step up and over --> airex pad step up and over, CGA x4 laps.     Step over to airex pad R and L foot --> Ambulating over unstable surfaces with x5 hedgehog balls under neath --> 8"  step ups and over, x4 laps, CGA, x1 LoB leading to stepping strategy to correct    Forward and lateral stepping over 6" hurdles with 2# AW's, x2 laps/plane. Difficulty with L foot clearance with side stepping.     Side stepping marches on airex beam: x5 laps SUE support, CGA. Difficulty with raising LLE.   PATIENT EDUCATION: Education details: Patient was educated on diagnosis, anatomy and pathology involved, prognosis, role of PT, and was given an HEP, demonstrating exercise with proper form following verbal and tactile cues, and was  given a paper hand out to continue exercise at home. Pt was educated on and agreed to plan of care.  Person educated: Patient Education method: Explanation, Demonstration, and Verbal cues Education comprehension: verbalized understanding and returned demonstration  HOME EXERCISE PROGRAM: Walking program  CLINICAL IMPRESSION: Continuing POC with functional strengthening and dynamic balance. Dynamic balance exercises focused on SLS, LLE proprioception, and motor control to assist in LLE deficits from pt's MS to assist in gait tasks. Pt does quickly fatigue in LLE with stance phases and foot clearance phases leading to stepping strategy to correct LOB and need for regular seated rest breaks. Pt will benefit from skilled PT services to  reduce falls risk/serious injury and maximize functional mobility outcomes.    PT Long Term Goals - 11/29/21 1519       PT LONG TERM GOAL #1   Title Patient will be independent with balance HEP to maximize safety in ADL, IADL, and fitness activity    Baseline walking only; 09/05/21 is walking for exercise    Time 4    Period Weeks    Status Achieved    Target Date 07/13/21      PT LONG TERM GOAL #2   Title Pt to demonstrate improvement in minibesTest >3 points to demonstrate improved posture control and reduced falls risk.    Baseline 09/05/21 20/28;  11/29/21 20/28 07/18/22: 22/28 09/05/2022: 23/28   Time 12   Period Weeks    Status PROGRESSING                               PT LONG TERM GOAL #3   Title Pt will improve 6 MWT >1520ft to demonstrate improved cardiorespiratory fitness    Baseline 06/15/21: 1165ft (gait belt, no AD);   09/05/21: 1231ft (gait belt, no AD); 07/18/22: 1055ft (gait belt, no AD) 09/05/22: 960 ft x2 LOB (gait belt, no AD)   Time 12   Period Weeks    Status Deferred      PT LONG TERM GOAL #4   Title Pt to demonstrate 30sec chair rise >13 to indicate improved power in BLE.    Baseline 09/05/21: 9;  11/29/21: 10.5  07/18/22: 9.5 09/05/22: 11   Time 12   Period Weeks    Status On-going      PT LONG TERM GOAL  #5   TITLE Pt will demonstrate L SLS of 21 sec in order to demonstrate symmetrical static balance of unaffected LE to decrease fall risk    Baseline 02/15/21 R 21 sec; L 7.73;  11/29/21 R 22sec  L 8sec  07/18/22: R: 19.83 sec L: 5.76 sec 09/05/22: R: 26.75 sec, L: 10.38 sec    Time 12   Period Weeks    Status On-going             PLAN FOR NEXT SESSION: SLS activities, walking endurance    Ronan Duecker M. Fairly IV, PT, DPT Physical Therapist- [place]  [facility]   09/26/22, 4:48 PM

## 2022-09-26 NOTE — Progress Notes (Signed)
Spironolactone to [facility] [address]

## 2022-10-03 ENCOUNTER — Ambulatory Visit: Payer: 59

## 2022-10-03 DIAGNOSIS — R262 Difficulty in walking, not elsewhere classified: Secondary | ICD-10-CM

## 2022-10-03 DIAGNOSIS — Z9181 History of falling: Secondary | ICD-10-CM

## 2022-10-03 DIAGNOSIS — M6281 Muscle weakness (generalized): Secondary | ICD-10-CM

## 2022-10-03 NOTE — Therapy (Signed)
OUTPATIENT PHYSICAL THERAPY TREATMENT    Patient Name: Michelle Ruiz MRN: [id number] DOB:March 17, 1971, 52 y.o., female Today's Date: 10/03/2022  PCP: Nemiah Commander MD REFERRING PROVIDER: Sherryll Burger MD  END OF SESSION:  PT End of Session - 10/03/22 1607     Visit Number 58    Number of Visits 80    Date for PT Re-Evaluation 11/28/22    Authorization Type BCBS COMM Pro    Authorization Time Period 06/15/21-09/07/21    Authorization - Number of Visits 60    Progress Note Due on Visit 60    PT Start Time 1602    PT Stop Time 1645    PT Time Calculation (min) 43 min    Equipment Utilized During Treatment Gait belt    Activity Tolerance Patient tolerated treatment well    Behavior During Therapy WFL for tasks assessed/performed               Past Medical History:  Diagnosis Date   Abnormal Pap smear of cervix    Actinic keratosis    Anxiety    Basal cell carcinoma 04/14/2008   Right upper ant. thigh. BCC with sclerosis.   Basal cell carcinoma 08/14/2008   Right lat. lower thigh. Superficial.    Basal cell carcinoma 04/29/2019   Right mid forearm. Superficial and nodular patterns. EDC.   Basal cell carcinoma 08/17/2020   Right upper antecubital. EDC.   Basal cell carcinoma 08/23/2021   Left Lower Leg, EDC   Kidney stones    MS (multiple sclerosis) (HCC)    UTI (urinary tract infection)    Past Surgical History:  Procedure Laterality Date   BREAST BIOPSY Right 2010   stereotactic biopsy/ neg/ dr.byrnett   BREAST BIOPSY Left 2012   neg/dr. brynett   CESAREAN SECTION  2008   RPH   TONSILLECTOMY  2006   There are no problems to display for this patient.   REFERRING DIAG: Unsteadiness; difficulty walking  THERAPY DIAG:  Difficulty in walking, not elsewhere classified  Muscle weakness (generalized)  History of falling  Rationale for Evaluation and Treatment Rehabilitation  PERTINENT HISTORY: Patient reports history of falls x3 in the past 6 months secondary to drop  foot on the L LE. PMH: Multiple sclerosis; elbow fracture of the R UE   PRECAUTIONS: Falls  SUBJECTIVE:   SUBJECTIVE STATEMENT:  Pt reports doing well despite the heat. Denies falls.   PAIN:  Are you having pain? No  Objective:   TODAY'S TREATMENT: DATE: 10/03/22  There.ex:   Stationary bike for 5 min L6 for warm up. Not billed.    Neuro Re-Ed:   Obstacle course:   6x 100'. Seated rest after 3 reps. --> x6 alternating hurdle steps --> 6" step up and over --> x2 airex pad step up and overs --> airex beam with single LE on level floor    Feet together, eyes closed: 2x30 sec, SBA and chair behind for safety.    Normal stance, vertical head turns: x10/side    Normal stance, horizontal head turns: x10/side   Provided updated HEP to address vestibular and sensory balance systems. Pt reports significant balance changes with head turns, unstable surfaces and low light scenarios indicative of heavy reliance on vision for balance.   PATIENT EDUCATION: Education details: Patient was educated on diagnosis, anatomy and pathology involved, prognosis, role of PT, and was given an HEP, demonstrating exercise with proper form following verbal and tactile cues, and was given a paper hand out to continue  exercise at home. Pt was educated on and agreed to plan of care.  Person educated: Patient Education method: Explanation, Demonstration, and Verbal cues Education comprehension: verbalized understanding and returned demonstration  HOME EXERCISE PROGRAM: Walking program  Access Code: [email or web address] URL: [email or web address] Date: 10/03/2022 Prepared by: Ronnie Derby  Exercises - Romberg Stance with Eyes Closed  - 1 x daily - 3-4 x weekly - 1 sets - 3 reps - 30 hold - Standing with Head Rotation  - 1 x daily - 3-4 x weekly - 1 sets - 3 reps - 30 hold - Standing with Head Nod  - 1 x daily - 3-4 x weekly - 1 sets - 3 reps - 30 hold  CLINICAL IMPRESSION: Today's focus of  session is dynamic balance and endurance with prolonged gait as pt fatigues quickly leading to worsening balance with community distances. Pt remains with difficulty with LLE proprioception in swing phase. Updated HEP to address vestibular and sensory/proprioceptive balance issues with new HEP print out. Pt understanding of exercises and safe completion with chair behind for safety. Pt will benefit from skilled PT services to reduce falls risk/serious injury and maximize functional mobility outcomes.    PT Long Term Goals - 11/29/21 1519       PT LONG TERM GOAL #1   Title Patient will be independent with balance HEP to maximize safety in ADL, IADL, and fitness activity    Baseline walking only; 09/05/21 is walking for exercise    Time 4    Period Weeks    Status Achieved    Target Date 07/13/21      PT LONG TERM GOAL #2   Title Pt to demonstrate improvement in minibesTest >3 points to demonstrate improved posture control and reduced falls risk.    Baseline 09/05/21 20/28;  11/29/21 20/28 07/18/22: 22/28 09/05/2022: 23/28   Time 12   Period Weeks    Status PROGRESSING                               PT LONG TERM GOAL #3   Title Pt will improve 6 MWT >1530ft to demonstrate improved cardiorespiratory fitness    Baseline 06/15/21: 1170ft (gait belt, no AD);   09/05/21: 1262ft (gait belt, no AD); 07/18/22: 1022ft (gait belt, no AD) 09/05/22: 960 ft x2 LOB (gait belt, no AD)   Time 12   Period Weeks    Status Deferred      PT LONG TERM GOAL #4   Title Pt to demonstrate 30sec chair rise >13 to indicate improved power in BLE.    Baseline 09/05/21: 9;  11/29/21: 10.5  07/18/22: 9.5 09/05/22: 11   Time 12   Period Weeks    Status On-going      PT LONG TERM GOAL  #5   TITLE Pt will demonstrate L SLS of 21 sec in order to demonstrate symmetrical static balance of unaffected LE to decrease fall risk    Baseline 02/15/21 R 21 sec; L 7.73;  11/29/21 R 22sec  L 8sec  07/18/22: R: 19.83 sec L: 5.76  sec 09/05/22: R: 26.75 sec, L: 10.38 sec    Time 12   Period Weeks    Status On-going             PLAN FOR NEXT SESSION: SLS activities, walking endurance    Domique Clapper M. Fairly IV, PT, DPT Physical Therapist- [place]  [facility]  10/03/22, 4:54 PM

## 2022-10-10 ENCOUNTER — Ambulatory Visit: Payer: 59

## 2022-10-10 ENCOUNTER — Other Ambulatory Visit: Payer: Self-pay | Admitting: Student

## 2022-10-10 DIAGNOSIS — R262 Difficulty in walking, not elsewhere classified: Secondary | ICD-10-CM | POA: Diagnosis not present

## 2022-10-10 DIAGNOSIS — Z9181 History of falling: Secondary | ICD-10-CM

## 2022-10-10 DIAGNOSIS — M6281 Muscle weakness (generalized): Secondary | ICD-10-CM

## 2022-10-10 DIAGNOSIS — Z8249 Family history of ischemic heart disease and other diseases of the circulatory system: Secondary | ICD-10-CM

## 2022-10-10 NOTE — Therapy (Signed)
OUTPATIENT PHYSICAL THERAPY TREATMENT    Patient Name: Michelle Ruiz MRN: [id number] DOB:[birth year], 52 y.o., female Today's Date: 10/10/2022  PCP: Nemiah Commander MD REFERRING PROVIDER: Sherryll Burger MD  END OF SESSION:  PT End of Session - 10/10/22 1604     Visit Number 59    Number of Visits 80    Date for PT Re-Evaluation 11/28/22    Authorization Type [employer]BS COMM Pro    Authorization Time Period 06/15/21-09/07/21    Authorization - Number of Visits 60    Progress Note Due on Visit 60    PT Start Time 1601    PT Stop Time 1645    PT Time Calculation (min) 44 min    Equipment Utilized During Treatment Gait belt    Activity Tolerance Patient tolerated treatment well    Behavior During Therapy WFL for tasks assessed/performed               Past Medical History:  Diagnosis Date   Abnormal Pap smear of cervix    Actinic keratosis    Anxiety    Basal cell carcinoma 04/14/2008   Right upper ant. thigh. BCC with sclerosis.   Basal cell carcinoma 08/14/2008   Right lat. lower thigh. Superficial.    Basal cell carcinoma 04/29/2019   Right mid forearm. Superficial and nodular patterns. EDC.   Basal cell carcinoma 08/17/2020   Right upper antecubital. EDC.   Basal cell carcinoma 08/23/2021   Left Lower Leg, EDC   Kidney stones    MS (multiple sclerosis) (HCC)    UTI (urinary tract infection)    Past Surgical History:  Procedure Laterality Date   BREAST BIOPSY Right 2010   stereotactic biopsy/ neg/ dr.byrnett   BREAST BIOPSY Left 2012   neg/dr. brynett   CESAREAN SECTION  2008   RPH   TONSILLECTOMY  2006   There are no problems to display for this patient.   REFERRING DIAG: Unsteadiness; difficulty walking  THERAPY DIAG:  Difficulty in walking, not elsewhere classified  Muscle weakness (generalized)  History of falling  Rationale for Evaluation and Treatment Rehabilitation  PERTINENT HISTORY: Patient reports history of falls x3 in the past 6 months secondary to drop  foot on the L LE. PMH: Multiple sclerosis; elbow fracture of the R UE   PRECAUTIONS: Falls  SUBJECTIVE:   SUBJECTIVE STATEMENT:  Pt reports no falls. Has not tried new exercises.   PAIN:  Are you having pain? No  Objective:   TODAY'S TREATMENT: DATE: 10/10/22  There.ex:   Stationary bike for 5 min L6 for warm up. Not billed.   Standing hip abduction at Matrix machine, 55#, 2x6 each    Standing hip extension at Matrix machine, 55#, 2x6 each    Standing hip flexion at Matrix machine, 40#, 2x6 each    Neuro Re-Ed:   Feet together, eyes closed: 3x30 sec, SBA and chair behind for safety.    Normal stance, vertical head turns: 2x10/side    Normal stance, horizontal head turns: 2x10/side  PATIENT EDUCATION: Education details: Patient was educated on diagnosis, anatomy and pathology involved, prognosis, role of PT, and was given an HEP, demonstrating exercise with proper form following verbal and tactile cues, and was given a paper hand out to continue exercise at home. Pt was educated on and agreed to plan of care.  Person educated: Patient Education method: Explanation, Demonstration, and Verbal cues Education comprehension: verbalized understanding and returned demonstration  HOME EXERCISE PROGRAM: Walking program  Access Code: [email or web address] URL:  [email or web address] Date: 10/03/2022 Prepared by: Ronnie Derby  Exercises - Romberg Stance with Eyes Closed  - 1 x daily - 3-4 x weekly - 1 sets - 3 reps - 30 hold - Standing with Head Rotation  - 1 x daily - 3-4 x weekly - 1 sets - 3 reps - 30 hold - Standing with Head Nod  - 1 x daily - 3-4 x weekly - 1 sets - 3 reps - 30 hold  CLINICAL IMPRESSION: Continuing with generalized LE strengthening and review of vestibular and sensory/proprioceptive balance exercises. Encouraged pt on importance of home HEP completion to reduce falls risk. Pt understanding. Pt does display improved hip/ankle righting reactions with  vision reduction and narrowed BOS.  Pt will benefit from skilled PT services to reduce falls risk/serious injury and maximize functional mobility outcomes.   PT Long Term Goals - 11/29/21 1519       PT LONG TERM GOAL #1   Title Patient will be independent with balance HEP to maximize safety in ADL, IADL, and fitness activity    Baseline walking only; 09/05/21 is walking for exercise    Time 4    Period Weeks    Status Achieved    Target Date 07/13/21      PT LONG TERM GOAL #2   Title Pt to demonstrate improvement in minibesTest >3 points to demonstrate improved posture control and reduced falls risk.    Baseline 09/05/21 20/28;  11/29/21 20/28 07/18/22: 22/28 09/05/2022: 23/28   Time 12   Period Weeks    Status PROGRESSING                               PT LONG TERM GOAL #3   Title Pt will improve 6 MWT >1564ft to demonstrate improved cardiorespiratory fitness    Baseline 06/15/21: 1162ft (gait belt, no AD);   09/05/21: 1233ft (gait belt, no AD); 07/18/22: 1010ft (gait belt, no AD) 09/05/22: 960 ft x2 LOB (gait belt, no AD)   Time 12   Period Weeks    Status Deferred      PT LONG TERM GOAL #4   Title Pt to demonstrate 30sec chair rise >13 to indicate improved power in BLE.    Baseline 09/05/21: 9;  11/29/21: 10.5  07/18/22: 9.5 09/05/22: 11   Time 12   Period Weeks    Status On-going      PT LONG TERM GOAL  #5   TITLE Pt will demonstrate L SLS of 21 sec in order to demonstrate symmetrical static balance of unaffected LE to decrease fall risk    Baseline 02/15/21 R 21 sec; L 7.73;  11/29/21 R 22sec  L 8sec  07/18/22: R: 19.83 sec L: 5.76 sec 09/05/22: R: 26.75 sec, L: 10.38 sec    Time 12   Period Weeks    Status On-going             PLAN FOR NEXT SESSION: SLS activities, walking endurance    Lilianne Delair M. Fairly IV, PT, DPT Physical Therapist- [place]  [facility]   10/10/22, 4:46 PM

## 2022-10-17 ENCOUNTER — Ambulatory Visit: Payer: 59

## 2022-10-17 DIAGNOSIS — R262 Difficulty in walking, not elsewhere classified: Secondary | ICD-10-CM | POA: Diagnosis not present

## 2022-10-17 DIAGNOSIS — Z9181 History of falling: Secondary | ICD-10-CM

## 2022-10-17 DIAGNOSIS — M6281 Muscle weakness (generalized): Secondary | ICD-10-CM

## 2022-10-17 NOTE — Therapy (Signed)
OUTPATIENT PHYSICAL THERAPY TREATMENT/PROGRESS NOTE Dates of Reporting Period: 07/18/22 - 10/17/22   Patient Name: Michelle Ruiz MRN: [id number] DOB:[birth year], 52 y.o., female Today's Date: 10/17/2022  PCP: Nemiah Commander MD REFERRING PROVIDER: Sherryll Burger MD  END OF SESSION:  PT End of Session - 10/17/22 1605     Visit Number 60    Number of Visits 80    Date for PT Re-Evaluation 11/28/22    Authorization Type [employer]BS COMM Pro    Authorization Time Period 06/15/21-09/07/21    Authorization - Number of Visits 60    Progress Note Due on Visit 60    PT Start Time 1601    PT Stop Time 1643    PT Time Calculation (min) 42 min    Equipment Utilized During Treatment Gait belt    Activity Tolerance Patient tolerated treatment well    Behavior During Therapy WFL for tasks assessed/performed               Past Medical History:  Diagnosis Date   Abnormal Pap smear of cervix    Actinic keratosis    Anxiety    Basal cell carcinoma 04/14/2008   Right upper ant. thigh. BCC with sclerosis.   Basal cell carcinoma 08/14/2008   Right lat. lower thigh. Superficial.    Basal cell carcinoma 04/29/2019   Right mid forearm. Superficial and nodular patterns. EDC.   Basal cell carcinoma 08/17/2020   Right upper antecubital. EDC.   Basal cell carcinoma 08/23/2021   Left Lower Leg, EDC   Kidney stones    MS (multiple sclerosis) (HCC)    UTI (urinary tract infection)    Past Surgical History:  Procedure Laterality Date   BREAST BIOPSY Right 2010   stereotactic biopsy/ neg/ dr.byrnett   BREAST BIOPSY Left 2012   neg/dr. brynett   CESAREAN SECTION  2008   RPH   TONSILLECTOMY  2006   There are no problems to display for this patient.   REFERRING DIAG: Unsteadiness; difficulty walking  THERAPY DIAG:  Difficulty in walking, not elsewhere classified  Muscle weakness (generalized)  History of falling  Rationale for Evaluation and Treatment Rehabilitation  PERTINENT HISTORY: Patient reports  history of falls x3 in the past 6 months secondary to drop foot on the L LE. PMH: Multiple sclerosis; elbow fracture of the R UE   PRECAUTIONS: Falls  SUBJECTIVE:   SUBJECTIVE STATEMENT:  Pt reports no falls. Has not tried new exercises. Has random bouts of stinging/burning subpatellar region.   PAIN:  Are you having pain? No  Objective:   TODAY'S TREATMENT: DATE: 10/17/22  There.ex:   Stationary bike for 5 min L5 for warm up. Not billed.   30 sec STS test: 10 reps  Sled pushes: 50#'s, 4x10 meters  Neuro Re-Ed:   SLS time. See goals section for details     Feet together, eyes closed: 2x1 minute, SBA and chair behind for safety.   Standing on airex pad feet together eyes open: 3x30 sec, SBA. Mild ankle righting reactions.   Tandem stance with forward foot on 6" step and back foot on airex pad: 3x30sec/LE SBA. Intermittent finger support and hip righting reaction with L foot on step and R foot on airex pad.   PATIENT EDUCATION: Education details: Patient was educated on diagnosis, anatomy and pathology involved, prognosis, role of PT, and was given an HEP, demonstrating exercise with proper form following verbal and tactile cues, and was given a paper hand out to continue exercise at home. Pt  was educated on and agreed to plan of care.  Person educated: Patient Education method: Explanation, Demonstration, and Verbal cues Education comprehension: verbalized understanding and returned demonstration  HOME EXERCISE PROGRAM: Walking program  Access Code: [email or web address] URL: [email or web address] Date: 10/03/2022 Prepared by: Ronnie Derby  Exercises - Romberg Stance with Eyes Closed  - 1 x daily - 3-4 x weekly - 1 sets - 3 reps - 30 hold - Standing with Head Rotation  - 1 x daily - 3-4 x weekly - 1 sets - 3 reps - 30 hold - Standing with Head Nod  - 1 x daily - 3-4 x weekly - 1 sets - 3 reps - 30 hold  CLINICAL IMPRESSION: Pt on 60th visit warranting progress  note. Pt remains with significant limitations with LLE SLS time compared to RLE and overall ISQ with 30 second STS endurance test. Pt remains with LLE muscular fatigue that leads to limited L ankle DF causing pt to intermittently trip anteriorly over her toe relying on PT and/or UE support to correct balance loss. Encouraged regular HEP compliance as pt has had limited participation. Patient's condition has the potential to improve in response to therapy. Maximum improvement is yet to be obtained. The anticipated improvement is attainable and reasonable in a generally predictable time. Pt will benefit from skilled PT services to reduce falls risk/serious injury and maximize functional mobility outcomes.   PT Long Term Goals - 11/29/21 1519       PT LONG TERM GOAL #1   Title Patient will be independent with balance HEP to maximize safety in ADL, IADL, and fitness activity    Baseline walking only; 09/05/21 is walking for exercise    Time 4    Period Weeks    Status Achieved    Target Date 07/13/21      PT LONG TERM GOAL #2   Title Pt to demonstrate improvement in minibesTest >3 points to demonstrate improved posture control and reduced falls risk.    Baseline 09/05/21 20/28;  11/29/21 20/28 07/18/22: 22/28 09/05/2022: 23/28   Time 12   Period Weeks    Status PROGRESSING                               PT LONG TERM GOAL #3   Title Pt will improve 6 MWT >1550ft to demonstrate improved cardiorespiratory fitness    Baseline 06/15/21: 1184ft (gait belt, no AD);   09/05/21: 1238ft (gait belt, no AD); 07/18/22: 1055ft (gait belt, no AD) 09/05/22: 960 ft x2 LOB (gait belt, no AD)   Time 12   Period Weeks    Status PROGRESSING      PT LONG TERM GOAL #4   Title Pt to demonstrate 30sec chair rise >13 to indicate improved power in BLE.    Baseline 09/05/21: 9;  11/29/21: 10.5  07/18/22: 9.5 09/05/22: 11 10/17/22: 10 (R knee pain limiting)   Time 12   Period Weeks    Status On-going      PT LONG  TERM GOAL  #5   TITLE Pt will demonstrate L SLS of 21 sec in order to demonstrate symmetrical static balance of unaffected LE to decrease fall risk    Baseline 02/15/21 R 21 sec; L 7.73;  11/29/21 R 22sec  L 8sec  07/18/22: R: 19.83 sec L: 5.76 sec 09/05/22: R: 26.75 sec, L: 10.38 sec  10/17/22: R: 20.58 sec, L: 2.91 seconds   Time 12  Period Weeks    Status On-going             PLAN FOR NEXT SESSION: SLS activities, walking endurance, obstacle courses for L foot clearance and proprioception. Incorporating vestibular input balance training.     Delphia Grates. Fairly IV, PT, DPT Physical Therapist- [place]  [facility]   10/17/22, 4:45 PM

## 2022-10-31 ENCOUNTER — Ambulatory Visit: Payer: No Typology Code available for payment source

## 2022-11-07 ENCOUNTER — Ambulatory Visit: Payer: No Typology Code available for payment source | Attending: Neurology

## 2022-11-07 DIAGNOSIS — Z9181 History of falling: Secondary | ICD-10-CM | POA: Insufficient documentation

## 2022-11-07 DIAGNOSIS — R269 Unspecified abnormalities of gait and mobility: Secondary | ICD-10-CM | POA: Insufficient documentation

## 2022-11-07 DIAGNOSIS — M6281 Muscle weakness (generalized): Secondary | ICD-10-CM | POA: Diagnosis present

## 2022-11-07 DIAGNOSIS — R262 Difficulty in walking, not elsewhere classified: Secondary | ICD-10-CM | POA: Insufficient documentation

## 2022-11-07 NOTE — Therapy (Addendum)
OUTPATIENT PHYSICAL THERAPY TREATMENT  Patient Name: Michelle Ruiz MRN: [id number] DOB:Jul 16, 1970, 52 y.o., female Today's Date: 11/07/2022  PCP: Nemiah Commander MD REFERRING PROVIDER: Sherryll Burger MD  END OF SESSION:  PT End of Session - 11/07/22 1604     Visit Number 61    Number of Visits 80    Date for PT Re-Evaluation 11/28/22    Authorization Type [employer] COMM Pro    Authorization Time Period 06/15/21-09/07/21    Authorization - Number of Visits 60    Progress Note Due on Visit 60    PT Start Time 1600    PT Stop Time 1643    PT Time Calculation (min) 43 min    Equipment Utilized During Treatment Gait belt    Activity Tolerance Patient tolerated treatment well    Behavior During Therapy WFL for tasks assessed/performed               Past Medical History:  Diagnosis Date   Abnormal Pap smear of cervix    Actinic keratosis    Anxiety    Basal cell carcinoma 04/14/2008   Right upper ant. thigh. BCC with sclerosis.   Basal cell carcinoma 08/14/2008   Right lat. lower thigh. Superficial.    Basal cell carcinoma 04/29/2019   Right mid forearm. Superficial and nodular patterns. EDC.   Basal cell carcinoma 08/17/2020   Right upper antecubital. EDC.   Basal cell carcinoma 08/23/2021   Left Lower Leg, EDC   Kidney stones    MS (multiple sclerosis) (HCC)    UTI (urinary tract infection)    Past Surgical History:  Procedure Laterality Date   BREAST BIOPSY Right 2010   stereotactic biopsy/ neg/ dr.byrnett   BREAST BIOPSY Left 2012   neg/dr. brynett   CESAREAN SECTION  2008   RPH   TONSILLECTOMY  2006   There are no problems to display for this patient.   REFERRING DIAG: Unsteadiness; difficulty walking  THERAPY DIAG:  Abnormality of gait and mobility  Difficulty in walking, not elsewhere classified  Muscle weakness (generalized)  History of falling  Rationale for Evaluation and Treatment Rehabilitation  PERTINENT HISTORY: Patient reports history of falls x3 in  the past 6 months secondary to drop foot on the L LE. PMH: Multiple sclerosis; elbow fracture of the R UE   PRECAUTIONS: Falls  SUBJECTIVE:   SUBJECTIVE STATEMENT:  Pt reports no pain at today's session and states she has not had any falls since the last session. Pt states she has only been able to complete here HEP once since her last session.   PAIN:  Are you having pain? No  Objective:   TODAY'S TREATMENT: DATE: 11/07/22  There.ex:   Stationary bike for 5 min L5 for warm up. Not billed.   Kettlebell swings w/ bilat UE 's,10# kettlebell 2 x10   2 x100' ambulating w/ 10# kettlebell rotating around body, (1 x100' CW, 1 x100' CCW)    Neuro Re-Ed:   Obstacle course 2x 30'; including stepping onto 8 in obstacles, unstable surfaces (airex pad), lateral stepping, airex balance beam, and cone taps    3 x100' ambulating w/ 2# AW on bilat LE's, incorporating horizontal and vertical head turns   PATIENT EDUCATION: Education details: Patient was educated on diagnosis, anatomy and pathology involved, prognosis, role of PT, and was given an HEP, demonstrating exercise with proper form following verbal and tactile cues, and was given a paper hand out to continue exercise at home. Pt was educated on and  agreed to plan of care.  Person educated: Patient Education method: Explanation, Demonstration, and Verbal cues Education comprehension: verbalized understanding and returned demonstration  HOME EXERCISE PROGRAM: Walking program  Access Code: [email or web address] URL: [email or web address] Date: 10/03/2022 Prepared by: Ronnie Derby  Exercises - Romberg Stance with Eyes Closed  - 1 x daily - 3-4 x weekly - 1 sets - 3 reps - 30 hold - Standing with Head Rotation  - 1 x daily - 3-4 x weekly - 1 sets - 3 reps - 30 hold - Standing with Head Nod  - 1 x daily - 3-4 x weekly - 1 sets - 3 reps - 30 hold  CLINICAL IMPRESSION: Session focused on neuro re-education and bilat LE  strengthening. Pt demonstrates improvements with LE strength, noting improved L ankle DF, displayed with no anterior LOB caused by L ankle DF weakness.  Pt continues to display difficulty with obstacle negotiation and ambulation with multiple neurological inputs with focus on vestibular impairment, noting increased fatigue following activity. Pt would continue to benefit from skilled PT interventions to assist in the potential for her condition to improve in response to therapy, and to reduce falls risk/ serious injury and maximize functional mobility outcomes.     PT Long Term Goals - 11/29/21 1519       PT LONG TERM GOAL #1   Title Patient will be independent with balance HEP to maximize safety in ADL, IADL, and fitness activity    Baseline walking only; 09/05/21 is walking for exercise    Time 4    Period Weeks    Status Achieved    Target Date 07/13/21      PT LONG TERM GOAL #2   Title Pt to demonstrate improvement in minibesTest >3 points to demonstrate improved posture control and reduced falls risk.    Baseline 09/05/21 20/28;  11/29/21 20/28 07/18/22: 22/28 09/05/2022: 23/28   Time 12   Period Weeks    Status PROGRESSING                               PT LONG TERM GOAL #3   Title Pt will improve 6 MWT >1530ft to demonstrate improved cardiorespiratory fitness    Baseline 06/15/21: 1151ft (gait belt, no AD);   09/05/21: 1259ft (gait belt, no AD); 07/18/22: 1015ft (gait belt, no AD) 09/05/22: 960 ft x2 LOB (gait belt, no AD)   Time 12   Period Weeks    Status PROGRESSING      PT LONG TERM GOAL #4   Title Pt to demonstrate 30sec chair rise >13 to indicate improved power in BLE.    Baseline 09/05/21: 9;  11/29/21: 10.5  07/18/22: 9.5 09/05/22: 11 10/17/22: 10 (R knee pain limiting)   Time 12   Period Weeks    Status On-going      PT LONG TERM GOAL  #5   TITLE Pt will demonstrate L SLS of 21 sec in order to demonstrate symmetrical static balance of unaffected LE to decrease fall  risk    Baseline 02/15/21 R 21 sec; L 7.73;  11/29/21 R 22sec  L 8sec  07/18/22: R: 19.83 sec L: 5.76 sec 09/05/22: R: 26.75 sec, L: 10.38 sec  10/17/22: R: 20.58 sec, L: 2.91 seconds   Time 12   Period Weeks    Status On-going             PLAN FOR NEXT SESSION: Continue to progress  to SLS activities, improve walking endurance. Incorporate  vestibular input balance training. HEP review.      Delphia Grates. Fairly IV, PT, DPT Physical Therapist- [place]  [facility]   11/07/22, 4:51 PM

## 2022-11-10 ENCOUNTER — Other Ambulatory Visit: Payer: Self-pay | Admitting: Student

## 2022-11-10 DIAGNOSIS — Z8249 Family history of ischemic heart disease and other diseases of the circulatory system: Secondary | ICD-10-CM

## 2022-11-13 ENCOUNTER — Ambulatory Visit: Payer: No Typology Code available for payment source

## 2022-11-20 ENCOUNTER — Encounter: Payer: Self-pay | Admitting: Student

## 2022-11-21 ENCOUNTER — Ambulatory Visit: Payer: No Typology Code available for payment source

## 2022-11-21 DIAGNOSIS — Z9181 History of falling: Secondary | ICD-10-CM

## 2022-11-21 DIAGNOSIS — M6281 Muscle weakness (generalized): Secondary | ICD-10-CM

## 2022-11-21 DIAGNOSIS — R262 Difficulty in walking, not elsewhere classified: Secondary | ICD-10-CM

## 2022-11-21 DIAGNOSIS — R269 Unspecified abnormalities of gait and mobility: Secondary | ICD-10-CM | POA: Diagnosis not present

## 2022-11-21 NOTE — Therapy (Signed)
OUTPATIENT PHYSICAL THERAPY TREATMENT  Patient Name: Michelle Ruiz MRN: [id number] DOB:1970-05-07, 52 y.o., female Today's Date: 11/21/2022  PCP: Nemiah Commander MD REFERRING PROVIDER: Sherryll Burger MD  END OF SESSION:  PT End of Session - 11/21/22 1556     Visit Number 62    Number of Visits 80    Date for PT Re-Evaluation 11/28/22    Authorization Type [employer] COMM Pro    Authorization Time Period 06/15/21-09/07/21    Authorization - Number of Visits 60    Progress Note Due on Visit 60    PT Start Time 1601    PT Stop Time 1645    PT Time Calculation (min) 44 min    Equipment Utilized During Treatment Gait belt    Activity Tolerance Patient tolerated treatment well    Behavior During Therapy WFL for tasks assessed/performed               Past Medical History:  Diagnosis Date   Abnormal Pap smear of cervix    Actinic keratosis    Anxiety    Basal cell carcinoma 04/14/2008   Right upper ant. thigh. BCC with sclerosis.   Basal cell carcinoma 08/14/2008   Right lat. lower thigh. Superficial.    Basal cell carcinoma 04/29/2019   Right mid forearm. Superficial and nodular patterns. EDC.   Basal cell carcinoma 08/17/2020   Right upper antecubital. EDC.   Basal cell carcinoma 08/23/2021   Left Lower Leg, EDC   Kidney stones    MS (multiple sclerosis) (HCC)    UTI (urinary tract infection)    Past Surgical History:  Procedure Laterality Date   BREAST BIOPSY Right 2010   stereotactic biopsy/ neg/ dr.byrnett   BREAST BIOPSY Left 2012   neg/dr. brynett   CESAREAN SECTION  2008   RPH   TONSILLECTOMY  2006   There are no problems to display for this patient.   REFERRING DIAG: Unsteadiness; difficulty walking  THERAPY DIAG:  Abnormality of gait and mobility  Difficulty in walking, not elsewhere classified  Muscle weakness (generalized)  History of falling  Rationale for Evaluation and Treatment Rehabilitation  PERTINENT HISTORY: Patient reports history of falls x3 in  the past 6 months secondary to drop foot on the L LE. PMH: Multiple sclerosis; elbow fracture of the R UE   PRECAUTIONS: Falls  SUBJECTIVE:   SUBJECTIVE STATEMENT:  Pt reports LLE spasticity has been worse in her LLE since the last couple weeks. Thinks she is having an exacerbation. Difficulty clearing her LLE at night and at work. Having to lift her LLE pant leg with her hands to help pick her L foot up for steps.   PAIN:  Are you having pain? No  Objective:   TODAY'S TREATMENT: DATE: 11/21/22  There.ex:   Stationary bike for 5 min L4 for warm up. Not billed.   Kettlebell swings w/ bilat UE 's,10# kettlebell 2x8.    Neuro Re-Ed:   X6 cones alternating cone taps: X4 laps, CGA  Tandem walking 4x10', CGA. Moderate hip righting reaction on LLE  2x75' ambulating w/ 3# AW on bilat LE's working on LLE foot clearance. Moderate hip righting reactions bialterally and variable step lengths with LLE. CGA  Alternating 8" step ups with contralateral hip flexion to 90 degrees with 2 finger support: 2x8/LE    PATIENT EDUCATION: Education details: Patient was educated on diagnosis, anatomy and pathology involved, prognosis, role of PT, and was given an HEP, demonstrating exercise with proper form following verbal and  tactile cues, and was given a paper hand out to continue exercise at home. Pt was educated on and agreed to plan of care.  Person educated: Patient Education method: Explanation, Demonstration, and Verbal cues Education comprehension: verbalized understanding and returned demonstration  HOME EXERCISE PROGRAM: Walking program  Access Code: [email or web address] URL: [email or web address] Date: 10/03/2022 Prepared by: Ronnie Derby  Exercises - Romberg Stance with Eyes Closed  - 1 x daily - 3-4 x weekly - 1 sets - 3 reps - 30 hold - Standing with Head Rotation  - 1 x daily - 3-4 x weekly - 1 sets - 3 reps - 30 hold - Standing with Head Nod  - 1 x daily - 3-4 x weekly - 1  sets - 3 reps - 30 hold  CLINICAL IMPRESSION: Pt arriving after 2 week hiatus due to active MS exacerbation. Pt generally more unsteady and anxious with her balance with notable LLE spasticity and L foot clonus throughout PT today with regular hip righting reactions and variable step lengths. Continuing to work on regular foot clearance and pre gait motor patterns for gait tasks with pt reliant on hand support. Pt will need recert next session to further assess PT needs and POC. Pt would continue to benefit from skilled PT interventions to assist in the potential for her condition to improve in response to therapy, and to reduce falls risk/ serious injury and maximize functional mobility outcomes.    PT Long Term Goals - 11/29/21 1519       PT LONG TERM GOAL #1   Title Patient will be independent with balance HEP to maximize safety in ADL, IADL, and fitness activity    Baseline walking only; 09/05/21 is walking for exercise    Time 4    Period Weeks    Status Achieved    Target Date 07/13/21      PT LONG TERM GOAL #2   Title Pt to demonstrate improvement in minibesTest >3 points to demonstrate improved posture control and reduced falls risk.    Baseline 09/05/21 20/28;  11/29/21 20/28 07/18/22: 22/28 09/05/2022: 23/28   Time 12   Period Weeks    Status PROGRESSING                               PT LONG TERM GOAL #3   Title Pt will improve 6 MWT >1557ft to demonstrate improved cardiorespiratory fitness    Baseline 06/15/21: 1139ft (gait belt, no AD);   09/05/21: 1266ft (gait belt, no AD); 07/18/22: 1038ft (gait belt, no AD) 09/05/22: 960 ft x2 LOB (gait belt, no AD)   Time 12   Period Weeks    Status PROGRESSING      PT LONG TERM GOAL #4   Title Pt to demonstrate 30sec chair rise >13 to indicate improved power in BLE.    Baseline 09/05/21: 9;  11/29/21: 10.5  07/18/22: 9.5 09/05/22: 11 10/17/22: 10 (R knee pain limiting)   Time 12   Period Weeks    Status On-going      PT LONG  TERM GOAL  #5   TITLE Pt will demonstrate L SLS of 21 sec in order to demonstrate symmetrical static balance of unaffected LE to decrease fall risk    Baseline 02/15/21 R 21 sec; L 7.73;  11/29/21 R 22sec  L 8sec  07/18/22: R: 19.83 sec L: 5.76 sec 09/05/22: R: 26.75 sec, L: 10.38 sec  10/17/22: R: 20.58  sec, L: 2.91 seconds   Time 12   Period Weeks    Status On-going             PLAN FOR NEXT SESSION: Lewis Shock. Fairly IV, PT, DPT Physical Therapist- [place]  [facility]   11/21/22, 4:50 PM

## 2022-11-29 ENCOUNTER — Ambulatory Visit: Payer: No Typology Code available for payment source | Attending: Neurology

## 2022-11-29 ENCOUNTER — Other Ambulatory Visit: Payer: No Typology Code available for payment source

## 2022-11-29 DIAGNOSIS — M25621 Stiffness of right elbow, not elsewhere classified: Secondary | ICD-10-CM | POA: Insufficient documentation

## 2022-11-29 DIAGNOSIS — R269 Unspecified abnormalities of gait and mobility: Secondary | ICD-10-CM

## 2022-11-29 DIAGNOSIS — Z9181 History of falling: Secondary | ICD-10-CM | POA: Diagnosis present

## 2022-11-29 DIAGNOSIS — M6281 Muscle weakness (generalized): Secondary | ICD-10-CM

## 2022-11-29 DIAGNOSIS — R262 Difficulty in walking, not elsewhere classified: Secondary | ICD-10-CM | POA: Diagnosis present

## 2022-11-29 DIAGNOSIS — M25521 Pain in right elbow: Secondary | ICD-10-CM | POA: Diagnosis present

## 2022-11-29 NOTE — Therapy (Unsigned)
OUTPATIENT PHYSICAL THERAPY TREATMENT  Patient Name: Michelle Ruiz MRN: [id number] DOB:March 02, 1971, 52 y.o., female Today's Date: 11/30/2022  PCP: Nemiah Commander MD REFERRING PROVIDER: Sherryll Burger MD  END OF SESSION:  PT End of Session - 11/29/22 1602     Visit Number 63    Number of Visits 80    Date for PT Re-Evaluation 12/27/22    Authorization Type [employer] COMM Pro    Authorization Time Period 06/15/21-09/07/21    Authorization - Number of Visits 60    Progress Note Due on Visit 60    PT Start Time 1601    PT Stop Time 1640    PT Time Calculation (min) 39 min    Equipment Utilized During Treatment Gait belt    Activity Tolerance Patient tolerated treatment well    Behavior During Therapy WFL for tasks assessed/performed               Past Medical History:  Diagnosis Date   Abnormal Pap smear of cervix    Actinic keratosis    Anxiety    Basal cell carcinoma 04/14/2008   Right upper ant. thigh. BCC with sclerosis.   Basal cell carcinoma 08/14/2008   Right lat. lower thigh. Superficial.    Basal cell carcinoma 04/29/2019   Right mid forearm. Superficial and nodular patterns. EDC.   Basal cell carcinoma 08/17/2020   Right upper antecubital. EDC.   Basal cell carcinoma 08/23/2021   Left Lower Leg, EDC   Kidney stones    MS (multiple sclerosis) (HCC)    UTI (urinary tract infection)    Past Surgical History:  Procedure Laterality Date   BREAST BIOPSY Right 2010   stereotactic biopsy/ neg/ dr.byrnett   BREAST BIOPSY Left 2012   neg/dr. brynett   CESAREAN SECTION  2008   RPH   TONSILLECTOMY  2006   There are no problems to display for this patient.   REFERRING DIAG: Unsteadiness; difficulty walking  THERAPY DIAG:  Abnormality of gait and mobility  Difficulty in walking, not elsewhere classified  Muscle weakness (generalized)  History of falling  Rationale for Evaluation and Treatment Rehabilitation  PERTINENT HISTORY: Patient reports history of falls x3 in the  past 6 months secondary to drop foot on the L LE. PMH: Multiple sclerosis; elbow fracture of the R UE   PRECAUTIONS: Falls  SUBJECTIVE:   SUBJECTIVE STATEMENT:  Pt reports increased fatigue today and life stressors. Denies falls but has had some stumbles. Her MS exacerbation has subsided.   PAIN:  Are you having pain? No  Objective:   TODAY'S TREATMENT: DATE: 11/29/22  Neuro Re-Ed:  Stationary bike for 3 min L5 for warm up. Not billed.  Reassessment of all long term goals as pt at end of her current POC. See clinical impression and goals section for details.    PATIENT EDUCATION: Education details: Patient was educated on diagnosis, anatomy and pathology involved, prognosis, role of PT, and was given an HEP, demonstrating exercise with proper form following verbal and tactile cues, and was given a paper hand out to continue exercise at home. Pt was educated on and agreed to plan of care.  Person educated: Patient Education method: Explanation, Demonstration, and Verbal cues Education comprehension: verbalized understanding and returned demonstration  HOME EXERCISE PROGRAM: Walking program  Access Code: [email or web address] URL: [email or web address] Date: 10/03/2022 Prepared by: Ronnie Derby  Exercises - Romberg Stance with Eyes Closed  - 1 x daily - 3-4 x weekly - 1 sets - 3 reps - 30  hold - Standing with Head Rotation  - 1 x daily - 3-4 x weekly - 1 sets - 3 reps - 30 hold - Standing with Head Nod  - 1 x daily - 3-4 x weekly - 1 sets - 3 reps - 30 hold  CLINICAL IMPRESSION: Pt at end of POC requiring RECERT. Pt has made progress towards functional goals compared to prior visits via SLS on LLE and RLE along with 30 sec STS test indicative of improved LE strength and endurance with walking ADL's. No significant increase or decrease in MiniBESTest indicative of plateau with dynamic balance activities. Deferred until following visit due to increased fatigue from  baseline and after other testing of long term goals. Pt and PT in agreement to plan for additional month of PT to maximize functional mobility and likely discharge after end of next POC as pt likely meeting plateau in setting of progressive neurological condition. Pt would continue to benefit from skilled PT interventions to assist in the potential for her condition to improve in response to therapy, and to reduce falls risk/ serious injury and maximize functional mobility outcomes.    PT Long Term Goals - 11/29/21 1519       PT LONG TERM GOAL #1   Title Patient will be independent with balance HEP to maximize safety in ADL, IADL, and fitness activity    Baseline walking only; 09/05/21 is walking for exercise    Time 4    Period Weeks    Status Achieved    Target Date 07/13/21      PT LONG TERM GOAL #2   Title Pt to demonstrate improvement in minibesTest >3 points to demonstrate improved posture control and reduced falls risk.    Baseline 09/05/21 20/28;  11/29/21 20/28 07/18/22: 22/28 09/05/2022: 23/28 11/29/2022: 22/28   Time 4   Period Weeks    Status PROGRESSING                               PT LONG TERM GOAL #3   Title Pt will improve 6 MWT >1574ft to demonstrate improved cardiorespiratory fitness    Baseline 06/15/21: 1175ft (gait belt, no AD);   09/05/21: 124ft (gait belt, no AD); 07/18/22: 1057ft (gait belt, no AD) 09/05/22: 960 ft x2 LOB (gait belt, no AD) 11/30/22: Deferred to next session   Time 4   Period Weeks    Status PROGRESSING      PT LONG TERM GOAL #4   Title Pt to demonstrate 30sec chair rise >13 to indicate improved power in BLE.    Baseline 09/05/21: 9;  11/29/21: 10.5  07/18/22: 9.5 09/05/22: 11 10/17/22: 10 (R knee pain limiting) 11/29/22: 11   Time 4   Period Weeks    Status On-going      PT LONG TERM GOAL  #5   TITLE Pt will demonstrate L SLS of 21 sec in order to demonstrate symmetrical static balance of unaffected LE to decrease fall risk    Baseline  02/15/21 R 21 sec; L 7.73;  11/29/21 R 22sec  L 8sec  07/18/22: R: 19.83 sec L: 5.76 sec 09/05/22: R: 26.75 sec, L: 10.38 sec  10/17/22: R: 20.58 sec, L: 2.91 seconds 11/29/22: R: 30 sec, L: 8 sec   Time 4   Period Weeks    Status On-going             PLAN FOR NEXT SESSION: Dynamic balance,  LLE strengthening tasks.   Delphia Grates. Fairly IV, PT, DPT Physical Therapist- [place]  [facility]   11/30/22, 8:42 AM

## 2022-12-04 ENCOUNTER — Ambulatory Visit: Payer: No Typology Code available for payment source

## 2022-12-04 DIAGNOSIS — R269 Unspecified abnormalities of gait and mobility: Secondary | ICD-10-CM

## 2022-12-04 DIAGNOSIS — Z9181 History of falling: Secondary | ICD-10-CM

## 2022-12-04 DIAGNOSIS — M6281 Muscle weakness (generalized): Secondary | ICD-10-CM

## 2022-12-04 DIAGNOSIS — M25621 Stiffness of right elbow, not elsewhere classified: Secondary | ICD-10-CM

## 2022-12-04 DIAGNOSIS — R262 Difficulty in walking, not elsewhere classified: Secondary | ICD-10-CM

## 2022-12-04 DIAGNOSIS — M25521 Pain in right elbow: Secondary | ICD-10-CM

## 2022-12-04 NOTE — Therapy (Signed)
OUTPATIENT PHYSICAL THERAPY TREATMENT  Patient Name: Michelle Ruiz MRN: [id number] DOB:[birth year], 52 y.o., female Today's Date: 12/04/2022  PCP: Nemiah Commander MD REFERRING PROVIDER: Sherryll Burger MD  END OF SESSION:  PT End of Session - 12/04/22 1715     Visit Number 64    Number of Visits 80    Date for PT Re-Evaluation 12/27/22    Authorization Type BCBS COMM Pro    Authorization Time Period 06/15/21-09/07/21    Authorization - Visit Number 64    Authorization - Number of Visits 80    Progress Note Due on Visit 70    PT Start Time 1604    PT Stop Time 1647    PT Time Calculation (min) 43 min    Equipment Utilized During Treatment Gait belt    Activity Tolerance Patient tolerated treatment well    Behavior During Therapy WFL for tasks assessed/performed                Past Medical History:  Diagnosis Date   Abnormal Pap smear of cervix    Actinic keratosis    Anxiety    Basal cell carcinoma 04/14/2008   Right upper ant. thigh. BCC with sclerosis.   Basal cell carcinoma 08/14/2008   Right lat. lower thigh. Superficial.    Basal cell carcinoma 04/29/2019   Right mid forearm. Superficial and nodular patterns. EDC.   Basal cell carcinoma 08/17/2020   Right upper antecubital. EDC.   Basal cell carcinoma 08/23/2021   Left Lower Leg, EDC   Kidney stones    MS (multiple sclerosis) (HCC)    UTI (urinary tract infection)    Past Surgical History:  Procedure Laterality Date   BREAST BIOPSY Right 2010   stereotactic biopsy/ neg/ dr.byrnett   BREAST BIOPSY Left 2012   neg/dr. brynett   CESAREAN SECTION  2008   RPH   TONSILLECTOMY  2006   There are no problems to display for this patient.   REFERRING DIAG: Unsteadiness; difficulty walking  THERAPY DIAG:  Abnormality of gait and mobility  Difficulty in walking, not elsewhere classified  Muscle weakness (generalized)  History of falling  Pain in right elbow  Stiffness of right elbow, not elsewhere  classified  Rationale for Evaluation and Treatment Rehabilitation  PERTINENT HISTORY:  Patient reports history of falls x3 in the past 6 months secondary to drop foot on the L LE. PMH: Multiple sclerosis; elbow fracture of the R UE   PRECAUTIONS: Falls  SUBJECTIVE:   SUBJECTIVE STATEMENT:  Pt reports she is ready to do   PAIN:  Are you having pain? No  Objective:   TODAY'S TREATMENT: DATE: 12/04/22   TherEx:   Kettlebell swings w/ bilat UE 's,10# kettlebell 2x10 pt noting relief due to  STS, no UE support, x10 STS with staggered stance, x10 each direction   Reassessment of all long term goals as pt at end of her current POC. See clinical impression and goals section for details.      PATIENT EDUCATION: Education details: Patient was educated on diagnosis, anatomy and pathology involved, prognosis, role of PT, and was given an HEP, demonstrating exercise with proper form following verbal and tactile cues, and was given a paper hand out to continue exercise at home. Pt was educated on and agreed to plan of care.  Person educated: Patient Education method: Explanation, Demonstration, and Verbal cues Education comprehension: verbalized understanding and returned demonstration  HOME EXERCISE PROGRAM: Walking program  Access Code: [email or web address] URL: [email or web address] Date: 10/03/2022  Prepared by: Ronnie Derby  Exercises - Romberg Stance with Eyes Closed  - 1 x daily - 3-4 x weekly - 1 sets - 3 reps - 30 hold - Standing with Head Rotation  - 1 x daily - 3-4 x weekly - 1 sets - 3 reps - 30 hold - Standing with Head Nod  - 1 x daily - 3-4 x weekly - 1 sets - 3 reps - 30 hold  CLINICAL IMPRESSION:  Pt performed 6 minute walk today and has made some regression with specific goal.  Pt winded and fatigued following the test.  Pt also performed LE strengthening exercises in order to improve functional mobility and increase endurance levels necessary for  community mobility.   Pt will continue to benefit from skilled therapy to address remaining deficits in order to improve overall QoL and return to PLOF.        PT Long Term Goals - 11/29/21 1519       PT LONG TERM GOAL #1   Title Patient will be independent with balance HEP to maximize safety in ADL, IADL, and fitness activity    Baseline walking only; 09/05/21 is walking for exercise    Time 4    Period Weeks    Status Achieved    Target Date 07/13/21      PT LONG TERM GOAL #2   Title Pt to demonstrate improvement in minibesTest >3 points to demonstrate improved posture control and reduced falls risk.    Baseline 09/05/21 20/28;  11/29/21 20/28 07/18/22: 22/28 09/05/2022: 23/28 11/29/2022: 22/28   Time 4   Period Weeks    Status PROGRESSING                               PT LONG TERM GOAL #3   Title Pt will improve 6 MWT >1557ft to demonstrate improved cardiorespiratory fitness    Baseline 06/15/21: 1168ft (gait belt, no AD);   09/05/21: 1258ft (gait belt, no AD); 07/18/22: 1019ft (gait belt, no AD) 09/05/22: 960 ft x2 LOB (gait belt, no AD) 12/04/22: 912 ft (gait belt; no AD)   Time 4   Period Weeks    Status PROGRESSING      PT LONG TERM GOAL #4   Title Pt to demonstrate 30sec chair rise >13 to indicate improved power in BLE.    Baseline 09/05/21: 9;  11/29/21: 10.5  07/18/22: 9.5 09/05/22: 11 10/17/22: 10 (R knee pain limiting) 11/29/22: 11   Time 4   Period Weeks    Status On-going      PT LONG TERM GOAL  #5   TITLE Pt will demonstrate L SLS of 21 sec in order to demonstrate symmetrical static balance of unaffected LE to decrease fall risk    Baseline 02/15/21 R 21 sec; L 7.73;  11/29/21 R 22sec  L 8sec  07/18/22: R: 19.83 sec L: 5.76 sec 09/05/22: R: 26.75 sec, L: 10.38 sec  10/17/22: R: 20.58 sec, L: 2.91 seconds 11/29/22: R: 30 sec, L: 8 sec   Time 4   Period Weeks    Status On-going             PLAN FOR NEXT SESSION: Dynamic balance, LLE strengthening  tasks.    Nolon Bussing, PT, DPT Physical Therapist - [facility]  12/04/22, 5:16 PM

## 2022-12-12 ENCOUNTER — Ambulatory Visit: Payer: No Typology Code available for payment source

## 2022-12-12 DIAGNOSIS — Z9181 History of falling: Secondary | ICD-10-CM

## 2022-12-12 DIAGNOSIS — M6281 Muscle weakness (generalized): Secondary | ICD-10-CM

## 2022-12-12 DIAGNOSIS — R269 Unspecified abnormalities of gait and mobility: Secondary | ICD-10-CM | POA: Diagnosis not present

## 2022-12-12 DIAGNOSIS — R262 Difficulty in walking, not elsewhere classified: Secondary | ICD-10-CM

## 2022-12-12 NOTE — Therapy (Cosign Needed)
OUTPATIENT PHYSICAL THERAPY TREATMENT  Patient Name: Michelle Ruiz MRN: [id number] DOB:Oct 29, 1970, 52 y.o., female Today's Date: 12/13/2022  PCP: Nemiah Commander MD REFERRING PROVIDER: Sherryll Burger MD  END OF SESSION:  PT End of Session - 12/12/22 1608     Visit Number 65    Number of Visits 80    Date for PT Re-Evaluation 12/27/22    Authorization Type BCBS COMM Pro    Authorization Time Period 06/15/21-09/07/21    Authorization - Number of Visits 80    Progress Note Due on Visit 70    PT Start Time 1602    PT Stop Time 1643    PT Time Calculation (min) 41 min    Equipment Utilized During Treatment Gait belt    Activity Tolerance Patient tolerated treatment well    Behavior During Therapy WFL for tasks assessed/performed                Past Medical History:  Diagnosis Date   Abnormal Pap smear of cervix    Actinic keratosis    Anxiety    Basal cell carcinoma 04/14/2008   Right upper ant. thigh. BCC with sclerosis.   Basal cell carcinoma 08/14/2008   Right lat. lower thigh. Superficial.    Basal cell carcinoma 04/29/2019   Right mid forearm. Superficial and nodular patterns. EDC.   Basal cell carcinoma 08/17/2020   Right upper antecubital. EDC.   Basal cell carcinoma 08/23/2021   Left Lower Leg, EDC   Kidney stones    MS (multiple sclerosis) (HCC)    UTI (urinary tract infection)    Past Surgical History:  Procedure Laterality Date   BREAST BIOPSY Right 2010   stereotactic biopsy/ neg/ dr.byrnett   BREAST BIOPSY Left 2012   neg/dr. brynett   CESAREAN SECTION  2008   RPH   TONSILLECTOMY  2006   There are no problems to display for this patient.   REFERRING DIAG: Unsteadiness; difficulty walking  THERAPY DIAG:  Abnormality of gait and mobility  Difficulty in walking, not elsewhere classified  Muscle weakness (generalized)  History of falling  Rationale for Evaluation and Treatment Rehabilitation  PERTINENT HISTORY:  Patient reports history of falls x3  in the past 6 months secondary to drop foot on the L LE. PMH: Multiple sclerosis; elbow fracture of the R UE   PRECAUTIONS: Falls  SUBJECTIVE:   SUBJECTIVE STATEMENT:  Pt reports no pain at today's session. No notable changes over the weekend.   PAIN:  Are you having pain? No  Objective:   TODAY'S TREATMENT: DATE: 12/12/22   TherEx:  Stationary bike for 5 min L4 for LE strengthening.  Kettlebell swings w/ bilat UE 's,10# kettlebell 2x10  Mini-squats w/ 10# KB 2 x10   Neuro re-ed:  Alternating cone taps x4 w/ 1 UE support 3 x30 sec. CGA w/ intermittent single UE support.  Tandem walking 3 x10' down and back; CGA  PATIENT EDUCATION: Education details: Patient was educated on diagnosis, anatomy and pathology involved, prognosis, role of PT, and was given an HEP, demonstrating exercise with proper form following verbal and tactile cues, and was given a paper hand out to continue exercise at home. Pt was educated on and agreed to plan of care.  Person educated: Patient Education method: Explanation, Demonstration, and Verbal cues Education comprehension: verbalized understanding and returned demonstration  HOME EXERCISE PROGRAM: Walking program  Access Code: [email or web address] URL: [email or web address] Date: 10/03/2022 Prepared by: Ronnie Derby  Exercises - Romberg Stance with Eyes Closed  -  1 x daily - 3-4 x weekly - 1 sets - 3 reps - 30 hold - Standing with Head Rotation  - 1 x daily - 3-4 x weekly - 1 sets - 3 reps - 30 hold - Standing with Head Nod  - 1 x daily - 3-4 x weekly - 1 sets - 3 reps - 30 hold  CLINICAL IMPRESSION: Session focused on bilat LE strengthening and balance exercises. Pt notes improvements in bilat LE strength noted w/ increased activity tolerance at today's session. Pt notes continued difficulty w/ SLS balance activities, displayed during cone tap exercises needing to take a step to correct LOB. Pt will continue to benefit from skilled  therapy to address remaining deficits in order to improve overall QoL and return to PLOF.      PT Long Term Goals - 11/29/21 1519       PT LONG TERM GOAL #1   Title Patient will be independent with balance HEP to maximize safety in ADL, IADL, and fitness activity    Baseline walking only; 09/05/21 is walking for exercise    Time 4    Period Weeks    Status Achieved    Target Date 07/13/21      PT LONG TERM GOAL #2   Title Pt to demonstrate improvement in minibesTest >3 points to demonstrate improved posture control and reduced falls risk.    Baseline 09/05/21 20/28;  11/29/21 20/28 07/18/22: 22/28 09/05/2022: 23/28 11/29/2022: 22/28   Time 4   Period Weeks    Status PROGRESSING                               PT LONG TERM GOAL #3   Title Pt will improve 6 MWT >1576ft to demonstrate improved cardiorespiratory fitness    Baseline 06/15/21: 1161ft (gait belt, no AD);   09/05/21: 1265ft (gait belt, no AD); 07/18/22: 1010ft (gait belt, no AD) 09/05/22: 960 ft x2 LOB (gait belt, no AD) 12/04/22: 912 ft (gait belt; no AD)   Time 4   Period Weeks    Status PROGRESSING      PT LONG TERM GOAL #4   Title Pt to demonstrate 30sec chair rise >13 to indicate improved power in BLE.    Baseline 09/05/21: 9;  11/29/21: 10.5  07/18/22: 9.5 09/05/22: 11 10/17/22: 10 (R knee pain limiting) 11/29/22: 11   Time 4   Period Weeks    Status On-going      PT LONG TERM GOAL  #5   TITLE Pt will demonstrate L SLS of 21 sec in order to demonstrate symmetrical static balance of unaffected LE to decrease fall risk    Baseline 02/15/21 R 21 sec; L 7.73;  11/29/21 R 22sec  L 8sec  07/18/22: R: 19.83 sec L: 5.76 sec 09/05/22: R: 26.75 sec, L: 10.38 sec  10/17/22: R: 20.58 sec, L: 2.91 seconds 11/29/22: R: 30 sec, L: 8 sec   Time 4   Period Weeks    Status On-going             PLAN FOR NEXT SESSION: SLS activities, dynamic balance, LLE strengthening tasks.   Lovie Macadamia, SPT  Delphia Grates. Fairly IV, PT,  DPT Physical Therapist- [place]  [facility]  12/13/22, 9:16 AM

## 2022-12-19 ENCOUNTER — Ambulatory Visit: Payer: No Typology Code available for payment source

## 2022-12-19 DIAGNOSIS — Z9181 History of falling: Secondary | ICD-10-CM

## 2022-12-19 DIAGNOSIS — M6281 Muscle weakness (generalized): Secondary | ICD-10-CM

## 2022-12-19 DIAGNOSIS — R262 Difficulty in walking, not elsewhere classified: Secondary | ICD-10-CM

## 2022-12-19 DIAGNOSIS — R269 Unspecified abnormalities of gait and mobility: Secondary | ICD-10-CM

## 2022-12-19 NOTE — Therapy (Cosign Needed)
OUTPATIENT PHYSICAL THERAPY TREATMENT  Patient Name: Michelle Ruiz MRN: [id number] DOB:03-24-1971, 52 y.o., female Today's Date: 12/20/2022  PCP: Nemiah Commander MD REFERRING PROVIDER: Sherryll Burger MD  END OF SESSION:  PT End of Session - 12/19/22 1601     Visit Number 66    Number of Visits 80    Date for PT Re-Evaluation 12/27/22    Authorization Type BCBS COMM Pro    Authorization Time Period 06/15/21-09/07/21    Authorization - Number of Visits 80    Progress Note Due on Visit 70    PT Start Time 1601    PT Stop Time 1642    PT Time Calculation (min) 41 min    Equipment Utilized During Treatment Gait belt    Activity Tolerance Patient tolerated treatment well    Behavior During Therapy WFL for tasks assessed/performed                Past Medical History:  Diagnosis Date   Abnormal Pap smear of cervix    Actinic keratosis    Anxiety    Basal cell carcinoma 04/14/2008   Right upper ant. thigh. BCC with sclerosis.   Basal cell carcinoma 08/14/2008   Right lat. lower thigh. Superficial.    Basal cell carcinoma 04/29/2019   Right mid forearm. Superficial and nodular patterns. EDC.   Basal cell carcinoma 08/17/2020   Right upper antecubital. EDC.   Basal cell carcinoma 08/23/2021   Left Lower Leg, EDC   Kidney stones    MS (multiple sclerosis) (HCC)    UTI (urinary tract infection)    Past Surgical History:  Procedure Laterality Date   BREAST BIOPSY Right 2010   stereotactic biopsy/ neg/ dr.byrnett   BREAST BIOPSY Left 2012   neg/dr. brynett   CESAREAN SECTION  2008   RPH   TONSILLECTOMY  2006   There are no problems to display for this patient.   REFERRING DIAG: Unsteadiness; difficulty walking  THERAPY DIAG:  Abnormality of gait and mobility  Difficulty in walking, not elsewhere classified  Muscle weakness (generalized)  History of falling  Rationale for Evaluation and Treatment Rehabilitation  PERTINENT HISTORY:  Patient reports history of falls x3  in the past 6 months secondary to drop foot on the L LE. PMH: Multiple sclerosis; elbow fracture of the R UE   PRECAUTIONS: Falls  SUBJECTIVE:   SUBJECTIVE STATEMENT:  Pt reports no pain at today's session, with no notable changes over the weekend.   PAIN:  Are you having pain? No  Objective:   TODAY'S TREATMENT: DATE: 12/19/22   TherEx:  Stationary bike for 5 min L4 for LE strengthening.  Kettlebell swings w/ bilat UE 's,10# kettlebell 2x10  Sled pushes 20# 3x 10 meters down and back  Forward step ups onto 4" step w/ 5# DB x8  Lateral step ups onto 4" step w/ 5# DB RLE/LLE x8/ each side   Neuro re-ed:  Tandem stance on airex pad w/ intermittent UE support RLE/LLE 2 x30sec/ each side   PATIENT EDUCATION: Education details: Patient was educated on diagnosis, anatomy and pathology involved, prognosis, role of PT, and was given an HEP, demonstrating exercise with proper form following verbal and tactile cues, and was given a paper hand out to continue exercise at home. Pt was educated on and agreed to plan of care.  Person educated: Patient Education method: Explanation, Demonstration, and Verbal cues Education comprehension: verbalized understanding and returned demonstration  HOME EXERCISE PROGRAM: Walking program  Access Code: [email or web address]  URL: [email or web address] Date: 10/03/2022 Prepared by: Ronnie Derby  Exercises - Romberg Stance with Eyes Closed  - 1 x daily - 3-4 x weekly - 1 sets - 3 reps - 30 hold - Standing with Head Rotation  - 1 x daily - 3-4 x weekly - 1 sets - 3 reps - 30 hold - Standing with Head Nod  - 1 x daily - 3-4 x weekly - 1 sets - 3 reps - 30 hold  CLINICAL IMPRESSION: Session focused on bilat LE strengthening and balance exercises. Pt notes improvements in bilat LE strength noted w/ increased activity tolerance at today's session. Pt able to complete sled pushes w/ 50#, 3 x25' down and back w/o a significant increase in fatigue.  Pt continues to note balance deficits displayed with increased lateral sway during balance activities and would continue to benefit from skilled therapy to address remaining deficits in order to improve overall QoL and return to PLOF.       PT Long Term Goals - 11/29/21 1519       PT LONG TERM GOAL #1   Title Patient will be independent with balance HEP to maximize safety in ADL, IADL, and fitness activity    Baseline walking only; 09/05/21 is walking for exercise    Time 4    Period Weeks    Status Achieved    Target Date 07/13/21      PT LONG TERM GOAL #2   Title Pt to demonstrate improvement in minibesTest >3 points to demonstrate improved posture control and reduced falls risk.    Baseline 09/05/21 20/28;  11/29/21 20/28 07/18/22: 22/28 09/05/2022: 23/28 11/29/2022: 22/28   Time 4   Period Weeks    Status PROGRESSING                               PT LONG TERM GOAL #3   Title Pt will improve 6 MWT >1571ft to demonstrate improved cardiorespiratory fitness    Baseline 06/15/21: 1158ft (gait belt, no AD);   09/05/21: 1254ft (gait belt, no AD); 07/18/22: 1065ft (gait belt, no AD) 09/05/22: 960 ft x2 LOB (gait belt, no AD) 12/04/22: 912 ft (gait belt; no AD)   Time 4   Period Weeks    Status PROGRESSING      PT LONG TERM GOAL #4   Title Pt to demonstrate 30sec chair rise >13 to indicate improved power in BLE.    Baseline 09/05/21: 9;  11/29/21: 10.5  07/18/22: 9.5 09/05/22: 11 10/17/22: 10 (R knee pain limiting) 11/29/22: 11   Time 4   Period Weeks    Status On-going      PT LONG TERM GOAL  #5   TITLE Pt will demonstrate L SLS of 21 sec in order to demonstrate symmetrical static balance of unaffected LE to decrease fall risk    Baseline 02/15/21 R 21 sec; L 7.73;  11/29/21 R 22sec  L 8sec  07/18/22: R: 19.83 sec L: 5.76 sec 09/05/22: R: 26.75 sec, L: 10.38 sec  10/17/22: R: 20.58 sec, L: 2.91 seconds 11/29/22: R: 30 sec, L: 8 sec   Time 4   Period Weeks    Status On-going              PLAN FOR NEXT SESSION: SLS activities, dynamic balance, LLE strengthening tasks.   Lovie Macadamia, SPT  Delphia Grates. Fairly IV, PT, DPT Physical Therapist- [place]  [facility]  12/20/22, 8:58 AM

## 2022-12-25 ENCOUNTER — Ambulatory Visit: Payer: No Typology Code available for payment source

## 2022-12-25 DIAGNOSIS — R262 Difficulty in walking, not elsewhere classified: Secondary | ICD-10-CM

## 2022-12-25 DIAGNOSIS — R269 Unspecified abnormalities of gait and mobility: Secondary | ICD-10-CM | POA: Diagnosis not present

## 2022-12-25 DIAGNOSIS — Z9181 History of falling: Secondary | ICD-10-CM

## 2022-12-25 DIAGNOSIS — M6281 Muscle weakness (generalized): Secondary | ICD-10-CM

## 2022-12-25 NOTE — Therapy (Signed)
OUTPATIENT PHYSICAL THERAPY TREATMENT/DISCHARGE  Patient Name: Michelle Ruiz MRN: [id number] DOB:02-Sep-1970, 52 y.o., female Today's Date: 12/25/2022  PCP: Nemiah Commander MD REFERRING PROVIDER: Sherryll Burger MD  END OF SESSION:  PT End of Session - 12/25/22 1606     Visit Number 67    Number of Visits 80    Date for PT Re-Evaluation 12/27/22    Authorization Type BCBS COMM Pro    Authorization Time Period 06/15/21-09/07/21    Authorization - Number of Visits 80    Progress Note Due on Visit 70    PT Start Time 1603    PT Stop Time 1645    PT Time Calculation (min) 42 min    Equipment Utilized During Treatment Gait belt    Activity Tolerance Patient tolerated treatment well    Behavior During Therapy WFL for tasks assessed/performed              Past Medical History:  Diagnosis Date   Abnormal Pap smear of cervix    Actinic keratosis    Anxiety    Basal cell carcinoma 04/14/2008   Right upper ant. thigh. BCC with sclerosis.   Basal cell carcinoma 08/14/2008   Right lat. lower thigh. Superficial.    Basal cell carcinoma 04/29/2019   Right mid forearm. Superficial and nodular patterns. EDC.   Basal cell carcinoma 08/17/2020   Right upper antecubital. EDC.   Basal cell carcinoma 08/23/2021   Left Lower Leg, EDC   Kidney stones    MS (multiple sclerosis) (HCC)    UTI (urinary tract infection)    Past Surgical History:  Procedure Laterality Date   BREAST BIOPSY Right 2010   stereotactic biopsy/ neg/ dr.byrnett   BREAST BIOPSY Left 2012   neg/dr. brynett   CESAREAN SECTION  2008   RPH   TONSILLECTOMY  2006   There are no problems to display for this patient.   REFERRING DIAG: Unsteadiness; difficulty walking  THERAPY DIAG:  Abnormality of gait and mobility  Difficulty in walking, not elsewhere classified  Muscle weakness (generalized)  History of falling  Rationale for Evaluation and Treatment Rehabilitation  PERTINENT HISTORY:  Patient reports history of  falls x3 in the past 6 months secondary to drop foot on the L LE. PMH: Multiple sclerosis; elbow fracture of the R UE   PRECAUTIONS: Falls  SUBJECTIVE:   SUBJECTIVE STATEMENT:  Pt reports she is ready for her last visit to be today and is awaiting a new referral to be seen at [facility] for more dynamic neuro-based PT.   PAIN:  Are you having pain? No  Objective:   TODAY'S TREATMENT: DATE: 12/25/22     TherEx:   Pt received education about discharge and change in current POC which was agreed upon by pt during prior sessions.    Goal assessment performed and updated below:  PATIENT EDUCATION: Education details: Patient was educated on diagnosis, anatomy and pathology involved, prognosis, role of PT, and was given an HEP, demonstrating exercise with proper form following verbal and tactile cues, and was given a paper hand out to continue exercise at home. Pt was educated on and agreed to plan of care.  Person educated: Patient Education method: Explanation, Demonstration, and Verbal cues Education comprehension: verbalized understanding and returned demonstration  HOME EXERCISE PROGRAM: Walking program  Access Code: [email or web address] URL: [email or web address] Date: 10/03/2022 Prepared by: Ronnie Derby  Exercises - Romberg Stance with Eyes Closed  - 1 x daily - 3-4 x weekly - 1 sets -  3 reps - 30 hold - Standing with Head Rotation  - 1 x daily - 3-4 x weekly - 1 sets - 3 reps - 30 hold - Standing with Head Nod  - 1 x daily - 3-4 x weekly - /1 sets - 3 reps - 30 hold   CLINICAL IMPRESSION:  Pt has made good improvements towards goals and was able to demonstrate increased stability with the tasks today.  Pt encouraged to continue with current HEP in the meantime of transitioning to outpatient neuro clinic.  Pt was able to meet mini BESTest  goal and showed inconsistent progress with all other goals.  Pt does continue to require positive reinforcement throughout  the session and would best be served having that in future sessions.   Pt has reached plateau of results at this time and will be discharged.  Pt encouraged to reach out to clinic for any questions regarding treatment or if any need arises.  Pt is formally discharged at this time.     PT Long Term Goals - 11/29/21 1519       PT LONG TERM GOAL #1   Title Patient will be independent with balance HEP to maximize safety in ADL, IADL, and fitness activity    Baseline walking only; 09/05/21 is walking for exercise    Time 4    Period Weeks    Status Achieved    Target Date 07/13/21      PT LONG TERM GOAL #2   Title Pt to demonstrate improvement in minibesTest >3 points to demonstrate improved posture control and reduced falls risk.    Baseline 09/05/21 20/28;  11/29/21 20/28 07/18/22: 22/28 09/05/2022: 23/28 11/29/2022: 22/28 12/25/2022: 24/28   Time 4   Period Weeks    Status ACHIEVED      PT LONG TERM GOAL #3   Title Pt will improve 6 MWT >1552ft to demonstrate improved cardiorespiratory fitness    Baseline 06/15/21: 1175ft (gait belt, no AD);   09/05/21: 1210ft (gait belt, no AD); 07/18/22: 1086ft (gait belt, no AD) 09/05/22: 960 ft x2 LOB (gait belt, no AD) 12/04/22: 912 ft (gait belt; no AD) 12/25/22: 996 ft (gait belt; no AD)   Time 4   Period Weeks    Status PROGRESSING      PT LONG TERM GOAL #4   Title Pt to demonstrate 30sec chair rise >13 to indicate improved power in BLE.    Baseline 09/05/21: 9;  11/29/21: 10.5  07/18/22: 9.5 09/05/22: 11 10/17/22: 10 (R knee pain limiting) 11/29/22: 11 12/25/22: 11   Time 4   Period Weeks    Status ON-GOING     PT LONG TERM GOAL  #5   TITLE Pt will demonstrate L SLS of 21 sec in order to demonstrate symmetrical static balance of unaffected LE to decrease fall risk    Baseline 02/15/21 R 21 sec; L 7.73;  11/29/21 R 22sec  L 8sec  07/18/22: R: 19.83 sec L: 5.76 sec 09/05/22: R: 26.75 sec, L: 10.38 sec  10/17/22: R: 20.58 sec, L: 2.91 seconds 11/29/22:  R: 30 sec, L: 8 sec 12/25/22: R: 30 sec; L: 5 sec   Time 4   Period Weeks    Status ON-GOING            PLAN FOR NEXT SESSION: SLS activities, dynamic balance, LLE strengthening tasks.   Nolon Bussing, PT, DPT Physical Therapist - [facility]  12/25/22, 4:52 PM

## 2023-01-01 ENCOUNTER — Other Ambulatory Visit: Payer: No Typology Code available for payment source

## 2023-01-01 ENCOUNTER — Ambulatory Visit
Admission: RE | Admit: 2023-01-01 | Discharge: 2023-01-01 | Disposition: A | Discharge status: Home / Self Care | Payer: No Typology Code available for payment source | Source: Ambulatory Visit | Attending: Student | Admitting: Student

## 2023-01-01 DIAGNOSIS — Z8249 Family history of ischemic heart disease and other diseases of the circulatory system: Secondary | ICD-10-CM

## 2023-03-19 ENCOUNTER — Ambulatory Visit: Payer: No Typology Code available for payment source | Admitting: Dermatology

## 2023-03-19 DIAGNOSIS — R238 Other skin changes: Secondary | ICD-10-CM

## 2023-03-19 DIAGNOSIS — D229 Melanocytic nevi, unspecified: Secondary | ICD-10-CM

## 2023-03-19 DIAGNOSIS — B079 Viral wart, unspecified: Secondary | ICD-10-CM | POA: Diagnosis not present

## 2023-03-19 DIAGNOSIS — L820 Inflamed seborrheic keratosis: Secondary | ICD-10-CM | POA: Diagnosis not present

## 2023-03-19 DIAGNOSIS — L821 Other seborrheic keratosis: Secondary | ICD-10-CM

## 2023-03-19 DIAGNOSIS — L700 Acne vulgaris: Secondary | ICD-10-CM

## 2023-03-19 DIAGNOSIS — D485 Neoplasm of uncertain behavior of skin: Secondary | ICD-10-CM

## 2023-03-19 DIAGNOSIS — W908XXA Exposure to other nonionizing radiation, initial encounter: Secondary | ICD-10-CM

## 2023-03-19 DIAGNOSIS — L814 Other melanin hyperpigmentation: Secondary | ICD-10-CM

## 2023-03-19 DIAGNOSIS — D1801 Hemangioma of skin and subcutaneous tissue: Secondary | ICD-10-CM

## 2023-03-19 DIAGNOSIS — L905 Scar conditions and fibrosis of skin: Secondary | ICD-10-CM

## 2023-03-19 DIAGNOSIS — L309 Dermatitis, unspecified: Secondary | ICD-10-CM | POA: Diagnosis not present

## 2023-03-19 DIAGNOSIS — Z1283 Encounter for screening for malignant neoplasm of skin: Secondary | ICD-10-CM | POA: Diagnosis not present

## 2023-03-19 DIAGNOSIS — D492 Neoplasm of unspecified behavior of bone, soft tissue, and skin: Secondary | ICD-10-CM

## 2023-03-19 DIAGNOSIS — L578 Other skin changes due to chronic exposure to nonionizing radiation: Secondary | ICD-10-CM

## 2023-03-19 DIAGNOSIS — D225 Melanocytic nevi of trunk: Secondary | ICD-10-CM

## 2023-03-19 DIAGNOSIS — Z85828 Personal history of other malignant neoplasm of skin: Secondary | ICD-10-CM

## 2023-03-19 MED ORDER — PIMECROLIMUS 1 % EX CREA: TOPICAL_CREAM | CUTANEOUS | 1 refills | Status: AC

## 2023-03-19 NOTE — Progress Notes (Signed)
Follow-Up Visit   Subjective  Michelle Ruiz is a 52 y.o. female who presents for the following: Skin Cancer Screening and Full Body Skin Exam  The patient presents for Total-Body Skin Exam (TBSE) for skin cancer screening and mole check. The patient has spots, moles and lesions to be evaluated, some may be new or changing and the patient may have concern these could be cancer. Spot on back is itchy. Spot in hairline previously frozen twice and not cleared up.  The following portions of the chart were reviewed this encounter and updated as appropriate: medications, allergies, medical history  Review of Systems:  No other skin or systemic complaints except as noted in HPI or Assessment and Plan.  Objective  Well appearing patient in no apparent distress; mood and affect are within normal limits.  A full examination was performed including scalp, head, eyes, ears, nose, lips, neck, chest, axillae, abdomen, back, buttocks, bilateral upper extremities, bilateral lower extremities, hands, feet, fingers, toes, fingernails, and toenails. All findings within normal limits unless otherwise noted below.   Relevant physical exam findings are noted in the Assessment and Plan.  L post auricular hairline 0.6 cm pink scaly papule  Spinal mid lower back x 1 Erythematous stuck-on, waxy papule   Assessment & Plan   SKIN CANCER SCREENING PERFORMED TODAY.  ACTINIC DAMAGE - Chronic condition, secondary to cumulative UV/sun exposure - diffuse scaly erythematous macules with underlying dyspigmentation - Recommend daily broad spectrum sunscreen SPF 30+ to sun-exposed areas, reapply every 2 hours as needed.  - Staying in the shade or wearing long sleeves, sun glasses (UVA+UVB protection) and wide brim hats (4-inch brim around the entire circumference of the hat) are also recommended for sun protection.  - Call for new or changing lesions.  LENTIGINES, SEBORRHEIC KERATOSES, HEMANGIOMAS - Benign normal  skin lesions - Benign-appearing - Call for any changes  HISTORY OF BASAL CELL CARCINOMA OF THE SKIN - No evidence of recurrence today - Recommend regular full body skin exams - Recommend daily broad spectrum sunscreen SPF 30+ to sun-exposed areas, reapply every 2 hours as needed.  - Call if any new or changing lesions are noted between office visits  MELANOCYTIC NEVI - Tan-brown and/or pink-flesh-colored symmetric macules and papules - 5 x 3 mm medium brown macule at left mid back - 4 mm brown macule with darker center at left posterior shoulder - 2.5 mm med dark brown macule at right medial knee - 4 mm speckled brown macule at left lateral heel - Benign appearing on exam today - Observation - Call clinic for new or changing moles - Recommend daily use of broad spectrum spf 30+ sunscreen to sun-exposed areas.   Atopic dermatitis vs seborrheic dermatitis  Exam:  Mild erythema of the R earlobe and helix, with some telangiectasias  Chronic and persistent condition with duration or expected duration over one year. Condition is symptomatic / bothersome to patient. Not to goal.   Atopic dermatitis (eczema) is a chronic, relapsing, pruritic condition that can significantly affect quality of life. It is often associated with allergic rhinitis and/or asthma and can require treatment with topical medications, phototherapy, or in severe cases biologic injectable medication (Dupixent; Adbry) or Oral JAK inhibitors.  Treatment Plan: Start Pimecromlimus cream to aa BID PRN  D/C HC 2.5% cream due to risk of skin atrophy with persistent use of topical steroids. Patient was using hydrocortisone 2.5% cream which has caused mild skin atrophy and is no longer safe to use.  ACNE VULGARIS Exam: clear today  Chronic condition with duration or expected duration over one year. Currently well-controlled.  Treatment Plan: Continue Duac gel QAM,  D/C Spironolactone. pt unable to tolerate Spironolactone  100 mg po QD due to s/e of dry eyes.  SCAR vs dermatofibroma Exam: Dyspigmented smooth macule or patch R upper pretibia  Benign-appearing.  Observation.  Call clinic for new or changing lesions. Recommend daily broad spectrum sunscreen SPF 30+, reapply every 2 hours as needed.  NEOPLASM OF UNCERTAIN BEHAVIOR OF SKIN L post auricular hairline Epidermal / dermal shaving  Lesion diameter (cm):  0.6 Informed consent: discussed and consent obtained   Patient was prepped and draped in usual sterile fashion: area prepped with alcohol. Anesthesia: the lesion was anesthetized in a standard fashion   Anesthetic:  1% lidocaine w/ epinephrine 1-100,000 buffered w/ 8.4% NaHCO3 Instrument used: flexible razor blade   Hemostasis achieved with: pressure, aluminum chloride and electrodesiccation   Outcome: patient tolerated procedure well   Post-procedure details: wound care instructions given   Post-procedure details comment:  Ointment and small bandage applied Specimen 1 - Surgical pathology Differential Diagnosis: D48.5 r/o prurigo nodule vs ISK vs SCC Check Margins: Yes Prurigo nodule vs ISK vs SCC vs other INFLAMED SEBORRHEIC KERATOSIS Spinal mid lower back x 1 Symptomatic, irritating, patient would like treated.  Destruction of lesion - Spinal mid lower back x 1  Destruction method: cryotherapy   Informed consent: discussed and consent obtained   Lesion destroyed using liquid nitrogen: Yes   Region frozen until ice ball extended beyond lesion: Yes   Outcome: patient tolerated procedure well with no complications   Post-procedure details: wound care instructions given   Additional details:  Prior to procedure, discussed risks of blister formation, small wound, skin dyspigmentation, or rare scar following cryotherapy. Recommend Vaseline ointment to treated areas while healing.   Return in about 6 months (around 09/17/2023) for TBSE - hx BCC.  Maylene Roes, CMA, am acting as scribe for  Willeen Niece, MD .   Documentation: I have reviewed the above documentation for accuracy and completeness, and I agree with the above.  Willeen Niece, MD

## 2023-03-19 NOTE — Patient Instructions (Addendum)

## 2023-03-26 ENCOUNTER — Ambulatory Visit: Payer: 59 | Admitting: Dermatology

## 2023-03-26 LAB — SURGICAL PATHOLOGY

## 2023-03-29 ENCOUNTER — Telehealth: Payer: Self-pay

## 2023-03-29 NOTE — Telephone Encounter (Signed)
-----   Message from Willeen Niece sent at 03/27/2023 11:42 AM EST ----- 1. Skin, L post auricular hairline :       VERRUCA VULGARIS, IRRITATED   Benign irritated wart, recommend cryotherapy if it recurs - please call patient

## 2023-03-29 NOTE — Telephone Encounter (Signed)
 I returned patient's call and left a detailed voicemail with her pathology results as requested.

## 2023-03-29 NOTE — Telephone Encounter (Addendum)
 Tried calling patient regarding results. No answer. LM for patient to return call.   ----- Message from Rexene Rattler sent at 03/27/2023 11:42 AM EST ----- 1. Skin, L post auricular hairline :       VERRUCA VULGARIS, IRRITATED   Benign irritated wart, recommend cryotherapy if it recurs - please call patient

## 2023-04-23 NOTE — Therapy (Signed)
OUTPATIENT PHYSICAL THERAPY NEURO EVALUATION   Patient Name: Michelle Ruiz MRN: [id number] DOB:Dec 13, 1970, 53 y.o., female Today's Date: 04/24/2023   PCP: Enid Baas  REFERRING PROVIDER: Cristopher Peru K  END OF SESSION:  PT End of Session - 04/24/23 1324     Visit Number 1    Number of Visits 24    Date for PT Re-Evaluation 07/17/23    PT Start Time 0925    PT Stop Time 1013    PT Time Calculation (min) 48 min             Past Medical History:  Diagnosis Date   Abnormal Pap smear of cervix    Actinic keratosis    Anxiety    Basal cell carcinoma 04/14/2008   Right upper ant. thigh. BCC with sclerosis.   Basal cell carcinoma 08/14/2008   Right lat. lower thigh. Superficial.    Basal cell carcinoma 04/29/2019   Right mid forearm. Superficial and nodular patterns. EDC.   Basal cell carcinoma 08/17/2020   Right upper antecubital. EDC.   Basal cell carcinoma 08/23/2021   Left Lower Leg, EDC   Kidney stones    MS (multiple sclerosis) (HCC)    UTI (urinary tract infection)    Past Surgical History:  Procedure Laterality Date   BREAST BIOPSY Right 2010   stereotactic biopsy/ neg/ dr.byrnett   BREAST BIOPSY Left 2012   neg/dr. brynett   CESAREAN SECTION  2008   RPH   TONSILLECTOMY  2006   There are no active problems to display for this patient.   ONSET DATE: diagnosed 12 years ago.   REFERRING DIAG: MS, gait and mobility   THERAPY DIAG:  Difficulty in walking, not elsewhere classified - Plan: PT plan of care cert/re-cert  Unsteadiness on feet - Plan: PT plan of care cert/re-cert  Rationale for Evaluation and Treatment: Rehabilitation  SUBJECTIVE:                                                                                                                                                                                             SUBJECTIVE STATEMENT: Patient presents to PT for evaluation for MS and ambulation.  Pt accompanied by:  self  PERTINENT HISTORY: Patient was seen by physical therapy until September of last year. Pmh includes MS, UTI, anxiety. Has extreme spasticity in LLE. Extreme cold and heat affect her LLE. Patient is now taking hormonal medication. Patient is a Chartered certified accountant.   PAIN:  Are you having pain? No  PRECAUTIONS: None  RED FLAGS: None   WEIGHT BEARING RESTRICTIONS: No  FALLS: Has patient fallen in last  6 months? No  LIVING ENVIRONMENT: Lives with: lives with their family Lives in: House/apartment Stairs:  no stairs to enter, lives on second floor Has following equipment at home: Single point cane and shower chair  PLOF: Independent  PATIENT GOALS: continue walking as long as she can.   OBJECTIVE:  Note: Objective measures were completed at Evaluation unless otherwise noted.  DIAGNOSTIC FINDINGS: n/a  COGNITION: Overall cognitive status: Within functional limits for tasks assessed   SENSATION: WFL; slight decrease to RLE   COORDINATION: Heel slide: limited LLE    MUSCLE TONE: LLE: Mild and Moderate   POSTURE: No Significant postural limitations  LOWER EXTREMITY ROM:     WFL  LOWER EXTREMITY MMT:    MMT Right Eval Left Eval  Hip flexion 6.4 4.2  Hip extension    Hip abduction 7.9 3.4  Hip adduction 7.5 8.5  Hip internal rotation    Hip external rotation    Knee flexion 11.1 10.2  Knee extension 7.8 10  Ankle dorsiflexion 10.8 6.8  Ankle plantarflexion 7.2 6.9  Ankle inversion    Ankle eversion    (Blank rows = not tested)  BED MOBILITY:  Scooting into bed is very challenging at end of night  TRANSFERS: Assistive device utilized: Single point cane  Sit to stand: Complete Independence Stand to sit: Complete Independence Chair to chair: Complete Independence    STAIRS: Level of Assistance: CGA Stair Negotiation Technique: Alternating Pattern  with Single Rail on Right Number of Stairs: 5  Height of Stairs: 6 inch    GAIT: Gait  pattern: step through pattern and poor foot clearance- Left Distance walked: 60 ft Assistive device utilized: None Level of assistance: CGA Comments: L foot scuffing  FUNCTIONAL TESTS:  5 times sit to stand: 13 seconds with arms crossed.  6 minute walk test: next session  Functional gait assessment: 14/30  PATIENT SURVEYS:  ABC scale 60.5%                                                                                                                               TREATMENT DATE: 04/24/23 Eval only     PATIENT EDUCATION: Education details: goals, POC Person educated: Patient Education method: Explanation, Demonstration, Tactile cues, and Verbal cues Education comprehension: verbalized understanding, returned demonstration, verbal cues required, and tactile cues required  HOME EXERCISE PROGRAM: Give next session   GOALS: Goals reviewed with patient? Yes  SHORT TERM GOALS: Target date: 05/22/2023    Patient will be independent in home exercise program to improve strength/mobility for better functional independence with ADLs. Baseline: Goal status: INITIAL    LONG TERM GOALS: Target date: 07/17/2023    Patient will increase Functional Gait Assessment score to >25/30 as to reduce fall risk and improve dynamic gait safety with community ambulation. Baseline: 1/28: 14/30  Goal status: INITIAL  2.   Patient (< 28 years old) will complete five times sit to stand test in <  10 seconds indicating an increased LE strength and improved balance. Baseline: 1/28: 13 seconds Goal status: INITIAL  3.  Patient will increase six minute walk test distance to >1000 for progression to community ambulator and improve gait ability Baseline: Perform next session Goal status: INITIAL  4.  Patient will return to nightly ambulation's with less than five foot scuffs per ambulation to return to PLOF.  Baseline: 1/28: limited ambulation  Goal status: INITIAL  5. Patient will increase ABC  scale score >80% to demonstrate better functional mobility and better confidence with ADLs.  Baseline: 1/28: 60%  Goal status: INITIAL   ASSESSMENT:  CLINICAL IMPRESSION: Patient is a 53 y.o. female who was seen today for physical therapy evaluation and treatment for MS and mobility deficits. Patient has LLE spasticity and weakness that worsens with prolonged mobility. Her FGA demonstrated deficits in stability while standing and ambulating. Will perform 6 minute walk test next session to determine ambulation capacity. L foot drag noted with stairs and ambulation. Patient will benefit from focalized strengthening and stability. Patient will benefit from skilled physical therapy to increase LE strength, mobility, and stability for optimal quality of life.   OBJECTIVE IMPAIRMENTS: Abnormal gait, decreased activity tolerance, decreased balance, decreased coordination, decreased endurance, decreased mobility, difficulty walking, decreased strength, impaired perceived functional ability, impaired flexibility, impaired UE functional use, and improper body mechanics.   ACTIVITY LIMITATIONS: carrying, lifting, bending, sitting, standing, squatting, stairs, transfers, bed mobility, dressing, reach over head, hygiene/grooming, locomotion level, and caring for others  PARTICIPATION LIMITATIONS: meal prep, cleaning, laundry, interpersonal relationship, driving, shopping, community activity, and occupation  PERSONAL FACTORS: Age, Fitness, Past/current experiences, Profession, Time since onset of injury/illness/exacerbation, and 1-2 comorbidities: MS anxiety  are also affecting patient's functional outcome.   REHAB POTENTIAL: Good  CLINICAL DECISION MAKING: Evolving/moderate complexity  EVALUATION COMPLEXITY: Moderate  PLAN:  PT FREQUENCY: 2x/week  PT DURATION: 12 weeks  PLANNED INTERVENTIONS: 97164- PT Re-evaluation, 97110-Therapeutic exercises, 97530- Therapeutic activity, 97112- Neuromuscular  re-education, 97535- Self Care, 16109- Manual therapy, [phone]- Gait training, [phone]- Orthotic Fit/training, [phone]- Canalith repositioning, Patient/Family education, Balance training, Stair training, Taping, Dry Needling, Joint mobilization, Joint manipulation, Spinal mobilization, Vestibular training, Visual/preceptual remediation/compensation, DME instructions, Cryotherapy, and Moist heat  PLAN FOR NEXT SESSION: 6 min walk test in hallway, LLE strengthening, HEP , LLE coordination.    Precious Bard, PT 04/24/2023, 11:32 AM

## 2023-04-24 ENCOUNTER — Ambulatory Visit: Payer: No Typology Code available for payment source | Attending: Neurology

## 2023-04-24 DIAGNOSIS — R2681 Unsteadiness on feet: Secondary | ICD-10-CM | POA: Insufficient documentation

## 2023-04-24 DIAGNOSIS — R262 Difficulty in walking, not elsewhere classified: Secondary | ICD-10-CM | POA: Insufficient documentation

## 2023-05-01 ENCOUNTER — Ambulatory Visit: Payer: No Typology Code available for payment source | Admitting: Physical Therapy

## 2023-05-09 ENCOUNTER — Ambulatory Visit: Payer: No Typology Code available for payment source

## 2023-05-14 ENCOUNTER — Ambulatory Visit: Payer: No Typology Code available for payment source

## 2023-05-16 ENCOUNTER — Ambulatory Visit: Payer: No Typology Code available for payment source

## 2023-05-21 NOTE — Therapy (Incomplete)
 OUTPATIENT PHYSICAL THERAPY NEURO TREATMENT   Patient Name: Michelle Ruiz MRN: [id number] DOB:[birth year], 53 y.o., female Today's Date: 05/21/2023   PCP: Enid Baas  REFERRING PROVIDER: Cristopher Peru K  END OF SESSION:    Past Medical History:  Diagnosis Date   Abnormal Pap smear of cervix    Actinic keratosis    Anxiety    Basal cell carcinoma 04/14/2008   Right upper ant. thigh. BCC with sclerosis.   Basal cell carcinoma 08/14/2008   Right lat. lower thigh. Superficial.    Basal cell carcinoma 04/29/2019   Right mid forearm. Superficial and nodular patterns. EDC.   Basal cell carcinoma 08/17/2020   Right upper antecubital. EDC.   Basal cell carcinoma 08/23/2021   Left Lower Leg, EDC   Kidney stones    MS (multiple sclerosis) (HCC)    UTI (urinary tract infection)    Past Surgical History:  Procedure Laterality Date   BREAST BIOPSY Right 2010   stereotactic biopsy/ neg/ dr.byrnett   BREAST BIOPSY Left 2012   neg/dr. brynett   CESAREAN SECTION  2008   RPH   TONSILLECTOMY  2006   There are no active problems to display for this patient.   ONSET DATE: diagnosed 12 years ago.   REFERRING DIAG: MS, gait and mobility   THERAPY DIAG:  No diagnosis found.  Rationale for Evaluation and Treatment: Rehabilitation  SUBJECTIVE:                                                                                                                                                                                             SUBJECTIVE STATEMENT: *** Pt accompanied by: self  PERTINENT HISTORY: Patient was seen by physical therapy until September of last year. Pmh includes MS, UTI, anxiety. Has extreme spasticity in LLE. Extreme cold and heat affect her LLE. Patient is now taking hormonal medication. Patient is a Chartered certified accountant.   PAIN:  Are you having pain? No  PRECAUTIONS: None  RED FLAGS: None   WEIGHT BEARING RESTRICTIONS: No  FALLS: Has patient fallen  in last 6 months? No  LIVING ENVIRONMENT: Lives with: lives with their family Lives in: House/apartment Stairs:  no stairs to enter, lives on second floor Has following equipment at home: Single point cane and shower chair  PLOF: Independent  PATIENT GOALS: continue walking as long as she can.   OBJECTIVE:  Note: Objective measures were completed at Evaluation unless otherwise noted.  DIAGNOSTIC FINDINGS: n/a  COGNITION: Overall cognitive status: Within functional limits for tasks assessed   SENSATION: WFL; slight decrease to RLE   COORDINATION: Heel slide: limited LLE  MUSCLE TONE: LLE: Mild and Moderate   POSTURE: No Significant postural limitations  LOWER EXTREMITY ROM:     WFL  LOWER EXTREMITY MMT:    MMT Right Eval Left Eval  Hip flexion 6.4 4.2  Hip extension    Hip abduction 7.9 3.4  Hip adduction 7.5 8.5  Hip internal rotation    Hip external rotation    Knee flexion 11.1 10.2  Knee extension 7.8 10  Ankle dorsiflexion 10.8 6.8  Ankle plantarflexion 7.2 6.9  Ankle inversion    Ankle eversion    (Blank rows = not tested)  BED MOBILITY:  Scooting into bed is very challenging at end of night  TRANSFERS: Assistive device utilized: Single point cane  Sit to stand: Complete Independence Stand to sit: Complete Independence Chair to chair: Complete Independence    STAIRS: Level of Assistance: CGA Stair Negotiation Technique: Alternating Pattern  with Single Rail on Right Number of Stairs: 5  Height of Stairs: 6 inch    GAIT: Gait pattern: step through pattern and poor foot clearance- Left Distance walked: 60 ft Assistive device utilized: None Level of assistance: CGA Comments: L foot scuffing  FUNCTIONAL TESTS:  5 times sit to stand: 13 seconds with arms crossed.  6 minute walk test: next session  Functional gait assessment: 14/30  PATIENT SURVEYS:  ABC scale 60.5%                                                                                                                                TREATMENT DATE: 05/21/23 6 min walk test in hallway ,LLE strengthening, HEP , LLE coordination.    PATIENT EDUCATION: Education details: goals, POC Person educated: Patient Education method: Explanation, Demonstration, Tactile cues, and Verbal cues Education comprehension: verbalized understanding, returned demonstration, verbal cues required, and tactile cues required  HOME EXERCISE PROGRAM: Give next session   GOALS: Goals reviewed with patient? Yes  SHORT TERM GOALS: Target date: 05/22/2023    Patient will be independent in home exercise program to improve strength/mobility for better functional independence with ADLs. Baseline: Goal status: INITIAL    LONG TERM GOALS: Target date: 07/17/2023    Patient will increase Functional Gait Assessment score to >25/30 as to reduce fall risk and improve dynamic gait safety with community ambulation. Baseline: 1/28: 14/30  Goal status: INITIAL  2.   Patient (< 65 years old) will complete five times sit to stand test in < 10 seconds indicating an increased LE strength and improved balance. Baseline: 1/28: 13 seconds Goal status: INITIAL  3.  Patient will increase six minute walk test distance to >1000 for progression to community ambulator and improve gait ability Baseline: Perform next session Goal status: INITIAL  4.  Patient will return to nightly ambulation's with less than five foot scuffs per ambulation to return to PLOF.  Baseline: 1/28: limited ambulation  Goal status: INITIAL  5. Patient will increase ABC scale score >80% to  demonstrate better functional mobility and better confidence with ADLs.  Baseline: 1/28: 60%  Goal status: INITIAL   ASSESSMENT:  CLINICAL IMPRESSION: *** Patient will benefit from focalized strengthening and stability. Patient will benefit from skilled physical therapy to increase LE strength, mobility, and stability for optimal  quality of life.   OBJECTIVE IMPAIRMENTS: Abnormal gait, decreased activity tolerance, decreased balance, decreased coordination, decreased endurance, decreased mobility, difficulty walking, decreased strength, impaired perceived functional ability, impaired flexibility, impaired UE functional use, and improper body mechanics.   ACTIVITY LIMITATIONS: carrying, lifting, bending, sitting, standing, squatting, stairs, transfers, bed mobility, dressing, reach over head, hygiene/grooming, locomotion level, and caring for others  PARTICIPATION LIMITATIONS: meal prep, cleaning, laundry, interpersonal relationship, driving, shopping, community activity, and occupation  PERSONAL FACTORS: Age, Fitness, Past/current experiences, Profession, Time since onset of injury/illness/exacerbation, and 1-2 comorbidities: MS anxiety  are also affecting patient's functional outcome.   REHAB POTENTIAL: Good  CLINICAL DECISION MAKING: Evolving/moderate complexity  EVALUATION COMPLEXITY: Moderate  PLAN:  PT FREQUENCY: 2x/week  PT DURATION: 12 weeks  PLANNED INTERVENTIONS: 97164- PT Re-evaluation, 97110-Therapeutic exercises, 97530- Therapeutic activity, 97112- Neuromuscular re-education, 97535- Self Care, 16109- Manual therapy, [phone]- Gait training, [phone]- Orthotic Fit/training, [phone]- Canalith repositioning, Patient/Family education, Balance training, Stair training, Taping, Dry Needling, Joint mobilization, Joint manipulation, Spinal mobilization, Vestibular training, Visual/preceptual remediation/compensation, DME instructions, Cryotherapy, and Moist heat  PLAN FOR NEXT SESSION: 6 min walk test in hallway, LLE strengthening, HEP , LLE coordination.    Precious Bard, PT 05/21/2023, 1:24 PM

## 2023-05-22 ENCOUNTER — Ambulatory Visit: Payer: No Typology Code available for payment source

## 2023-05-23 ENCOUNTER — Ambulatory Visit: Payer: No Typology Code available for payment source

## 2023-05-28 ENCOUNTER — Ambulatory Visit: Payer: No Typology Code available for payment source

## 2023-05-28 NOTE — Therapy (Signed)
 OUTPATIENT PHYSICAL THERAPY NEURO TREATMENT   Patient Name: Michelle Ruiz MRN: [id number] DOB:[birth year], 53 y.o., female Today's Date: 05/29/2023   PCP: Enid Baas  REFERRING PROVIDER: Cristopher Peru K  END OF SESSION:  PT End of Session - 05/29/23 0844     Visit Number 2    Number of Visits 24    Date for PT Re-Evaluation 07/17/23    PT Start Time 0845    PT Stop Time 0929    PT Time Calculation (min) 44 min              Past Medical History:  Diagnosis Date   Abnormal Pap smear of cervix    Actinic keratosis    Anxiety    Basal cell carcinoma 04/14/2008   Right upper ant. thigh. BCC with sclerosis.   Basal cell carcinoma 08/14/2008   Right lat. lower thigh. Superficial.    Basal cell carcinoma 04/29/2019   Right mid forearm. Superficial and nodular patterns. EDC.   Basal cell carcinoma 08/17/2020   Right upper antecubital. EDC.   Basal cell carcinoma 08/23/2021   Left Lower Leg, EDC   Kidney stones    MS (multiple sclerosis) (HCC)    UTI (urinary tract infection)    Past Surgical History:  Procedure Laterality Date   BREAST BIOPSY Right 2010   stereotactic biopsy/ neg/ dr.byrnett   BREAST BIOPSY Left 2012   neg/dr. brynett   CESAREAN SECTION  2008   RPH   TONSILLECTOMY  2006   There are no active problems to display for this patient.   ONSET DATE: diagnosed 12 years ago.   REFERRING DIAG: MS, gait and mobility   THERAPY DIAG:  Difficulty in walking, not elsewhere classified  Unsteadiness on feet  Abnormality of gait and mobility  Muscle weakness (generalized)  Rationale for Evaluation and Treatment: Rehabilitation  SUBJECTIVE:                                                                                                                                                                                             SUBJECTIVE STATEMENT: Patient reports one fall, foot dragged under her.  Pt accompanied by: self  PERTINENT HISTORY:  Patient was seen by physical therapy until September of last year. Pmh includes MS, UTI, anxiety. Has extreme spasticity in LLE. Extreme cold and heat affect her LLE. Patient is now taking hormonal medication. Patient is a Chartered certified accountant.   PAIN:  Are you having pain? No  PRECAUTIONS: None  RED FLAGS: None   WEIGHT BEARING RESTRICTIONS: No  FALLS: Has patient fallen in last 6 months? No  LIVING  ENVIRONMENT: Lives with: lives with their family Lives in: House/apartment Stairs:  no stairs to enter, lives on second floor Has following equipment at home: Single point cane and shower chair  PLOF: Independent  PATIENT GOALS: continue walking as long as she can.   OBJECTIVE:  Note: Objective measures were completed at Evaluation unless otherwise noted.  DIAGNOSTIC FINDINGS: n/a  COGNITION: Overall cognitive status: Within functional limits for tasks assessed   SENSATION: WFL; slight decrease to RLE   COORDINATION: Heel slide: limited LLE    MUSCLE TONE: LLE: Mild and Moderate   POSTURE: No Significant postural limitations  LOWER EXTREMITY ROM:     WFL  LOWER EXTREMITY MMT:    MMT Right Eval Left Eval  Hip flexion 6.4 4.2  Hip extension    Hip abduction 7.9 3.4  Hip adduction 7.5 8.5  Hip internal rotation    Hip external rotation    Knee flexion 11.1 10.2  Knee extension 7.8 10  Ankle dorsiflexion 10.8 6.8  Ankle plantarflexion 7.2 6.9  Ankle inversion    Ankle eversion    (Blank rows = not tested)  BED MOBILITY:  Scooting into bed is very challenging at end of night  TRANSFERS: Assistive device utilized: Single point cane  Sit to stand: Complete Independence Stand to sit: Complete Independence Chair to chair: Complete Independence    STAIRS: Level of Assistance: CGA Stair Negotiation Technique: Alternating Pattern  with Single Rail on Right Number of Stairs: 5  Height of Stairs: 6 inch    GAIT: Gait pattern: step through pattern and  poor foot clearance- Left Distance walked: 60 ft Assistive device utilized: None Level of assistance: CGA Comments: L foot scuffing  FUNCTIONAL TESTS:  5 times sit to stand: 13 seconds with arms crossed.  6 minute walk test: next session  Functional gait assessment: 14/30  PATIENT SURVEYS:  ABC scale 60.5%                                                                                                                               TREATMENT DATE: 05/29/23 6 Min Walk Test:  Instructed patient to ambulate as quickly and as safely as possible for 6 minutes using LRAD. Patient was allowed to take standing rest breaks without stopping the test, but if the patient required a sitting rest break the clock would be stopped and the test would be over.  Results: 820 feet  using a SPC with CGA. Results indicate that the patient has reduced endurance with ambulation compared to age matched norms.  Age Matched Norms: 24-69 yo M: 65 F: 79, 71-79 yo M: 11 F: 471, 51-89 yo M: 417 F: 392 MDC: 58.21 meters (190.98 feet) or 50 meters (ANPTA Core Set of Outcome Measures for Adults with Neurologic Conditions, 2018)   TherAct Heel slides with disc 10x each LE Slider abduction 10x each LE   Review HEP: Access Code: [email or web address] URL: [email or web address] Date: 05/29/2023 Prepared  by: Precious Bard  Exercises - Towel Scrunches  - 1 x daily - 7 x weekly - 2 sets - 10 reps - 5 hold - Toe Spreading  - 1 x daily - 7 x weekly - 2 sets - 10 reps - 5 hold - Seated Ankle Alphabet  - 1 x daily - 7 x weekly - 2 sets - 1 reps - 5 hold   PATIENT EDUCATION: Education details: goals, POC Person educated: Patient Education method: Explanation, Demonstration, Tactile cues, and Verbal cues Education comprehension: verbalized understanding, returned demonstration, verbal cues required, and tactile cues required  HOME EXERCISE PROGRAM: Access Code: [email or web address] URL:  [email or web address] Date: 05/29/2023 Prepared by: Precious Bard  Exercises - Towel Scrunches  - 1 x daily - 7 x weekly - 2 sets - 10 reps - 5 hold - Toe Spreading  - 1 x daily - 7 x weekly - 2 sets - 10 reps - 5 hold - Seated Ankle Alphabet  - 1 x daily - 7 x weekly - 2 sets - 1 reps - 5 hold  GOALS: Goals reviewed with patient? Yes  SHORT TERM GOALS: Target date: 05/22/2023    Patient will be independent in home exercise program to improve strength/mobility for better functional independence with ADLs. Baseline: Goal status: INITIAL    LONG TERM GOALS: Target date: 07/17/2023    Patient will increase Functional Gait Assessment score to >25/30 as to reduce fall risk and improve dynamic gait safety with community ambulation. Baseline: 1/28: 14/30  Goal status: INITIAL  2.   Patient (< 67 years old) will complete five times sit to stand test in < 10 seconds indicating an increased LE strength and improved balance. Baseline: 1/28: 13 seconds Goal status: INITIAL  3.  Patient will increase six minute walk test distance to >1000 for progression to community ambulator and improve gait ability Baseline: Perform next session 3/4: 820 ft Goal status: INITIAL  4.  Patient will return to nightly ambulation's with less than five foot scuffs per ambulation to return to PLOF.  Baseline: 1/28: limited ambulation  Goal status: INITIAL  5. Patient will increase ABC scale score >80% to demonstrate better functional mobility and better confidence with ADLs.  Baseline: 1/28: 60%  Goal status: INITIAL   ASSESSMENT:  CLINICAL IMPRESSION: Patient introduced to HEP and demonstrates understanding. Poor toe control of LLE is improved with repetition indicating area of focus to decrease L foot drag and fall risk. 6 min walk test performed and tolerated. Patient will benefit from skilled physical therapy to increase LE strength, mobility, and stability for optimal quality of life.    OBJECTIVE IMPAIRMENTS: Abnormal gait, decreased activity tolerance, decreased balance, decreased coordination, decreased endurance, decreased mobility, difficulty walking, decreased strength, impaired perceived functional ability, impaired flexibility, impaired UE functional use, and improper body mechanics.   ACTIVITY LIMITATIONS: carrying, lifting, bending, sitting, standing, squatting, stairs, transfers, bed mobility, dressing, reach over head, hygiene/grooming, locomotion level, and caring for others  PARTICIPATION LIMITATIONS: meal prep, cleaning, laundry, interpersonal relationship, driving, shopping, community activity, and occupation  PERSONAL FACTORS: Age, Fitness, Past/current experiences, Profession, Time since onset of injury/illness/exacerbation, and 1-2 comorbidities: MS anxiety  are also affecting patient's functional outcome.   REHAB POTENTIAL: Good  CLINICAL DECISION MAKING: Evolving/moderate complexity  EVALUATION COMPLEXITY: Moderate  PLAN:  PT FREQUENCY: 2x/week  PT DURATION: 12 weeks  PLANNED INTERVENTIONS: 97164- PT Re-evaluation, 97110-Therapeutic exercises, 97530- Therapeutic activity, [id number]- Neuromuscular re-education, 97535- Self Care, [id number]- Manual therapy, [id number]- Gait training, [phone]- Orthotic  Fit/training, 16109- Canalith repositioning, Patient/Family education, Balance training, Stair training, Taping, Dry Needling, Joint mobilization, Joint manipulation, Spinal mobilization, Vestibular training, Visual/preceptual remediation/compensation, DME instructions, Cryotherapy, and Moist heat  PLAN FOR NEXT SESSION: airex pad/balance, LLE strengthening, HEP , LLE coordination.    Precious Bard, PT 05/29/2023, 9:29 AM

## 2023-05-29 ENCOUNTER — Ambulatory Visit: Payer: No Typology Code available for payment source | Attending: Neurology

## 2023-05-29 DIAGNOSIS — R262 Difficulty in walking, not elsewhere classified: Secondary | ICD-10-CM | POA: Diagnosis present

## 2023-05-29 DIAGNOSIS — M6281 Muscle weakness (generalized): Secondary | ICD-10-CM | POA: Insufficient documentation

## 2023-05-29 DIAGNOSIS — R269 Unspecified abnormalities of gait and mobility: Secondary | ICD-10-CM | POA: Insufficient documentation

## 2023-05-29 DIAGNOSIS — R2681 Unsteadiness on feet: Secondary | ICD-10-CM | POA: Diagnosis present

## 2023-05-30 ENCOUNTER — Ambulatory Visit: Payer: No Typology Code available for payment source

## 2023-06-04 ENCOUNTER — Telehealth: Payer: Self-pay

## 2023-06-04 ENCOUNTER — Ambulatory Visit: Payer: No Typology Code available for payment source

## 2023-06-04 NOTE — Telephone Encounter (Signed)
 Patient called due to no show. Told time and date of next appt.   Precious Bard, PT, DPT Physical Therapist - [facility]  Outpatient Physical Therapy- Main Campus [phone]

## 2023-06-05 ENCOUNTER — Ambulatory Visit
Admission: RE | Admit: 2023-06-05 | Discharge: 2023-06-05 | Disposition: A | Discharge status: Home / Self Care | Payer: No Typology Code available for payment source | Source: Ambulatory Visit | Attending: Acute Care | Admitting: Acute Care

## 2023-06-05 DIAGNOSIS — Z122 Encounter for screening for malignant neoplasm of respiratory organs: Secondary | ICD-10-CM

## 2023-06-05 DIAGNOSIS — Z87891 Personal history of nicotine dependence: Secondary | ICD-10-CM

## 2023-06-05 NOTE — Therapy (Signed)
 OUTPATIENT PHYSICAL THERAPY NEURO TREATMENT   Patient Name: Michelle Ruiz MRN: [id number] DOB:Sep 19, 1970, 53 y.o., female Today's Date: 06/06/2023   PCP: Enid Baas  REFERRING PROVIDER: Cristopher Peru K  END OF SESSION:  PT End of Session - 06/06/23 1404     Visit Number 3    Number of Visits 24    Date for PT Re-Evaluation 07/17/23    PT Start Time 1403    PT Stop Time 1444    PT Time Calculation (min) 41 min    Equipment Utilized During Treatment Gait belt    Activity Tolerance Patient tolerated treatment well    Behavior During Therapy WFL for tasks assessed/performed               Past Medical History:  Diagnosis Date   Abnormal Pap smear of cervix    Actinic keratosis    Anxiety    Basal cell carcinoma 04/14/2008   Right upper ant. thigh. BCC with sclerosis.   Basal cell carcinoma 08/14/2008   Right lat. lower thigh. Superficial.    Basal cell carcinoma 04/29/2019   Right mid forearm. Superficial and nodular patterns. EDC.   Basal cell carcinoma 08/17/2020   Right upper antecubital. EDC.   Basal cell carcinoma 08/23/2021   Left Lower Leg, EDC   Kidney stones    MS (multiple sclerosis) (HCC)    UTI (urinary tract infection)    Past Surgical History:  Procedure Laterality Date   BREAST BIOPSY Right 2010   stereotactic biopsy/ neg/ dr.byrnett   BREAST BIOPSY Left 2012   neg/dr. brynett   CESAREAN SECTION  2008   RPH   TONSILLECTOMY  2006   There are no active problems to display for this patient.   ONSET DATE: diagnosed 12 years ago.   REFERRING DIAG: MS, gait and mobility   THERAPY DIAG:  Difficulty in walking, not elsewhere classified  Unsteadiness on feet  Abnormality of gait and mobility  Muscle weakness (generalized)  Rationale for Evaluation and Treatment: Rehabilitation  SUBJECTIVE:                                                                                                                                                                                              SUBJECTIVE STATEMENT: Patient reports when she tries to do the alphabet her leg tries to help.  Pt accompanied by: self  PERTINENT HISTORY: Patient was seen by physical therapy until September of last year. Pmh includes MS, UTI, anxiety. Has extreme spasticity in LLE. Extreme cold and heat affect her LLE. Patient is now taking hormonal medication. Patient is a Hotel manager  Interior and spatial designer.   PAIN:  Are you having pain? No  PRECAUTIONS: None  RED FLAGS: None   WEIGHT BEARING RESTRICTIONS: No  FALLS: Has patient fallen in last 6 months? No  LIVING ENVIRONMENT: Lives with: lives with their family Lives in: House/apartment Stairs:  no stairs to enter, lives on second floor Has following equipment at home: Single point cane and shower chair  PLOF: Independent  PATIENT GOALS: continue walking as long as she can.   OBJECTIVE:  Note: Objective measures were completed at Evaluation unless otherwise noted.  DIAGNOSTIC FINDINGS: n/a  COGNITION: Overall cognitive status: Within functional limits for tasks assessed   SENSATION: WFL; slight decrease to RLE   COORDINATION: Heel slide: limited LLE    MUSCLE TONE: LLE: Mild and Moderate   POSTURE: No Significant postural limitations  LOWER EXTREMITY ROM:     WFL  LOWER EXTREMITY MMT:    MMT Right Eval Left Eval  Hip flexion 6.4 4.2  Hip extension    Hip abduction 7.9 3.4  Hip adduction 7.5 8.5  Hip internal rotation    Hip external rotation    Knee flexion 11.1 10.2  Knee extension 7.8 10  Ankle dorsiflexion 10.8 6.8  Ankle plantarflexion 7.2 6.9  Ankle inversion    Ankle eversion    (Blank rows = not tested)  BED MOBILITY:  Scooting into bed is very challenging at end of night  TRANSFERS: Assistive device utilized: Single point cane  Sit to stand: Complete Independence Stand to sit: Complete Independence Chair to chair: Complete Independence    STAIRS: Level of  Assistance: CGA Stair Negotiation Technique: Alternating Pattern  with Single Rail on Right Number of Stairs: 5  Height of Stairs: 6 inch    GAIT: Gait pattern: step through pattern and poor foot clearance- Left Distance walked: 60 ft Assistive device utilized: None Level of assistance: CGA Comments: L foot scuffing  FUNCTIONAL TESTS:  5 times sit to stand: 13 seconds with arms crossed.  6 minute walk test: next session  Functional gait assessment: 14/30  PATIENT SURVEYS:  ABC scale 60.5%                                                                                                                               TREATMENT DATE: 06/06/23 Neuro Re-ed Standing with CGA next to support surface:  Airex pad: toe tap to 6" step alternating  Airex pad: one foot on 6" step one foot on airex pad, hold position for 30 seconds, switch legs, 2x each LE;   TherAct 6" step -step up/down 10x each LE -heel raise 10x   Toe abduction 10x Toe extension 10x;  Towel scrunch 10x  Prostretch df/pf x3 minutes seated Hamstring curl on soccer ball   PATIENT EDUCATION: Education details: goals, POC Person educated: Patient Education method: Explanation, Demonstration, Tactile cues, and Verbal cues Education comprehension: verbalized understanding, returned demonstration, verbal cues required, and tactile cues required  HOME  EXERCISE PROGRAM: Access Code: [email or web address] URL: [email or web address] Date: 05/29/2023 Prepared by: Precious Bard  Exercises - Towel Scrunches  - 1 x daily - 7 x weekly - 2 sets - 10 reps - 5 hold - Toe Spreading  - 1 x daily - 7 x weekly - 2 sets - 10 reps - 5 hold - Seated Ankle Alphabet  - 1 x daily - 7 x weekly - 2 sets - 1 reps - 5 hold  GOALS: Goals reviewed with patient? Yes  SHORT TERM GOALS: Target date: 05/22/2023    Patient will be independent in home exercise program to improve strength/mobility for better functional independence with  ADLs. Baseline: Goal status: INITIAL    LONG TERM GOALS: Target date: 07/17/2023    Patient will increase Functional Gait Assessment score to >25/30 as to reduce fall risk and improve dynamic gait safety with community ambulation. Baseline: 1/28: 14/30  Goal status: INITIAL  2.   Patient (< 44 years old) will complete five times sit to stand test in < 10 seconds indicating an increased LE strength and improved balance. Baseline: 1/28: 13 seconds Goal status: INITIAL  3.  Patient will increase six minute walk test distance to >1000 for progression to community ambulator and improve gait ability Baseline: Perform next session 3/4: 820 ft Goal status: INITIAL  4.  Patient will return to nightly ambulation's with less than five foot scuffs per ambulation to return to PLOF.  Baseline: 1/28: limited ambulation  Goal status: INITIAL  5. Patient will increase ABC scale score >80% to demonstrate better functional mobility and better confidence with ADLs.  Baseline: 1/28: 60%  Goal status: INITIAL   ASSESSMENT:  CLINICAL IMPRESSION:  Patient presents with excellent motivation. She tolerates progressive stability interventions without pain.  Prostretch introduced for home use. Patient tolerates all interventions well with some resting breaks for fatigue. Good carryover between sessions noted. Patient will benefit from skilled physical therapy to increase LE strength, mobility, and stability for optimal quality of life.   OBJECTIVE IMPAIRMENTS: Abnormal gait, decreased activity tolerance, decreased balance, decreased coordination, decreased endurance, decreased mobility, difficulty walking, decreased strength, impaired perceived functional ability, impaired flexibility, impaired UE functional use, and improper body mechanics.   ACTIVITY LIMITATIONS: carrying, lifting, bending, sitting, standing, squatting, stairs, transfers, bed mobility, dressing, reach over head, hygiene/grooming,  locomotion level, and caring for others  PARTICIPATION LIMITATIONS: meal prep, cleaning, laundry, interpersonal relationship, driving, shopping, community activity, and occupation  PERSONAL FACTORS: Age, Fitness, Past/current experiences, Profession, Time since onset of injury/illness/exacerbation, and 1-2 comorbidities: MS anxiety  are also affecting patient's functional outcome.   REHAB POTENTIAL: Good  CLINICAL DECISION MAKING: Evolving/moderate complexity  EVALUATION COMPLEXITY: Moderate  PLAN:  PT FREQUENCY: 2x/week  PT DURATION: 12 weeks  PLANNED INTERVENTIONS: 97164- PT Re-evaluation, 97110-Therapeutic exercises, 97530- Therapeutic activity, 97112- Neuromuscular re-education, 97535- Self Care, 54098- Manual therapy, [phone]- Gait training, [phone]- Orthotic Fit/training, [phone]- Canalith repositioning, Patient/Family education, Balance training, Stair training, Taping, Dry Needling, Joint mobilization, Joint manipulation, Spinal mobilization, Vestibular training, Visual/preceptual remediation/compensation, DME instructions, Cryotherapy, and Moist heat  PLAN FOR NEXT SESSION: airex pad/balance, LLE strengthening, HEP , LLE coordination.    Precious Bard, PT 06/06/2023, 2:47 PM

## 2023-06-06 ENCOUNTER — Ambulatory Visit: Payer: No Typology Code available for payment source

## 2023-06-06 DIAGNOSIS — R262 Difficulty in walking, not elsewhere classified: Secondary | ICD-10-CM

## 2023-06-06 DIAGNOSIS — M6281 Muscle weakness (generalized): Secondary | ICD-10-CM

## 2023-06-06 DIAGNOSIS — R2681 Unsteadiness on feet: Secondary | ICD-10-CM

## 2023-06-06 DIAGNOSIS — R269 Unspecified abnormalities of gait and mobility: Secondary | ICD-10-CM

## 2023-06-11 ENCOUNTER — Ambulatory Visit: Payer: No Typology Code available for payment source

## 2023-06-12 NOTE — Therapy (Signed)
 OUTPATIENT PHYSICAL THERAPY NEURO TREATMENT   Patient Name: Michelle Ruiz MRN: [id number] DOB:[birth year], 53 y.o., female Today's Date: 06/13/2023   PCP: Enid Baas  REFERRING PROVIDER: Cristopher Peru K  END OF SESSION:  PT End of Session - 06/13/23 1313     Visit Number 4    Number of Visits 24    Date for PT Re-Evaluation 07/17/23    PT Start Time 1315    PT Stop Time 1359    PT Time Calculation (min) 44 min    Equipment Utilized During Treatment Gait belt    Activity Tolerance Patient tolerated treatment well    Behavior During Therapy WFL for tasks assessed/performed                Past Medical History:  Diagnosis Date   Abnormal Pap smear of cervix    Actinic keratosis    Anxiety    Basal cell carcinoma 04/14/2008   Right upper ant. thigh. BCC with sclerosis.   Basal cell carcinoma 08/14/2008   Right lat. lower thigh. Superficial.    Basal cell carcinoma 04/29/2019   Right mid forearm. Superficial and nodular patterns. EDC.   Basal cell carcinoma 08/17/2020   Right upper antecubital. EDC.   Basal cell carcinoma 08/23/2021   Left Lower Leg, EDC   Kidney stones    MS (multiple sclerosis) (HCC)    UTI (urinary tract infection)    Past Surgical History:  Procedure Laterality Date   BREAST BIOPSY Right 2010   stereotactic biopsy/ neg/ dr.byrnett   BREAST BIOPSY Left 2012   neg/dr. brynett   CESAREAN SECTION  2008   RPH   TONSILLECTOMY  2006   There are no active problems to display for this patient.   ONSET DATE: diagnosed 12 years ago.   REFERRING DIAG: MS, gait and mobility   THERAPY DIAG:  Difficulty in walking, not elsewhere classified  Unsteadiness on feet  Abnormality of gait and mobility  Muscle weakness (generalized)  Rationale for Evaluation and Treatment: Rehabilitation  SUBJECTIVE:                                                                                                                                                                                              SUBJECTIVE STATEMENT: Patient reports she has not been compliant with HEP due to business at work.  Pt accompanied by: self  PERTINENT HISTORY: Patient was seen by physical therapy until September of last year. Pmh includes MS, UTI, anxiety. Has extreme spasticity in LLE. Extreme cold and heat affect her LLE. Patient is now taking hormonal medication. Patient is a  Chartered certified accountant.   PAIN:  Are you having pain? No  PRECAUTIONS: None  RED FLAGS: None   WEIGHT BEARING RESTRICTIONS: No  FALLS: Has patient fallen in last 6 months? No  LIVING ENVIRONMENT: Lives with: lives with their family Lives in: House/apartment Stairs:  no stairs to enter, lives on second floor Has following equipment at home: Single point cane and shower chair  PLOF: Independent  PATIENT GOALS: continue walking as long as she can.   OBJECTIVE:  Note: Objective measures were completed at Evaluation unless otherwise noted.  DIAGNOSTIC FINDINGS: n/a  COGNITION: Overall cognitive status: Within functional limits for tasks assessed   SENSATION: WFL; slight decrease to RLE   COORDINATION: Heel slide: limited LLE    MUSCLE TONE: LLE: Mild and Moderate   POSTURE: No Significant postural limitations  LOWER EXTREMITY ROM:     WFL  LOWER EXTREMITY MMT:    MMT Right Eval Left Eval  Hip flexion 6.4 4.2  Hip extension    Hip abduction 7.9 3.4  Hip adduction 7.5 8.5  Hip internal rotation    Hip external rotation    Knee flexion 11.1 10.2  Knee extension 7.8 10  Ankle dorsiflexion 10.8 6.8  Ankle plantarflexion 7.2 6.9  Ankle inversion    Ankle eversion    (Blank rows = not tested)  BED MOBILITY:  Scooting into bed is very challenging at end of night  TRANSFERS: Assistive device utilized: Single point cane  Sit to stand: Complete Independence Stand to sit: Complete Independence Chair to chair: Complete Independence    STAIRS: Level  of Assistance: CGA Stair Negotiation Technique: Alternating Pattern  with Single Rail on Right Number of Stairs: 5  Height of Stairs: 6 inch    GAIT: Gait pattern: step through pattern and poor foot clearance- Left Distance walked: 60 ft Assistive device utilized: None Level of assistance: CGA Comments: L foot scuffing  FUNCTIONAL TESTS:  5 times sit to stand: 13 seconds with arms crossed.  6 minute walk test: next session  Functional gait assessment: 14/30  PATIENT SURVEYS:  ABC scale 60.5%                                                                                                                               TREATMENT DATE: 06/13/23 Neuro Re-ed Standing with CGA next to support surface:  Airex pad: dual  task word game for visual scan and reach x2 trials  Half foam roller: df/pf 30x seated; 20x standing  Half foam roller standing still x30 seconds  TherAct Bosu ball round side up:  -crane step up with UE support 10x each LE -lateral crane stp up 10x each LE; BUE support   Toe abduction 10x Toe extension 10x;  Towel scrunch 10x    PATIENT EDUCATION: Education details: goals, POC Person educated: Patient Education method: Explanation, Demonstration, Tactile cues, and Verbal cues Education comprehension: verbalized understanding, returned demonstration, verbal cues required, and tactile  cues required   HOME EXERCISE PROGRAM: Access Code: [email or web address] URL: [email or web address] Date: 05/29/2023 Prepared by: Precious Bard  Exercises - Towel Scrunches  - 1 x daily - 7 x weekly - 2 sets - 10 reps - 5 hold - Toe Spreading  - 1 x daily - 7 x weekly - 2 sets - 10 reps - 5 hold - Seated Ankle Alphabet  - 1 x daily - 7 x weekly - 2 sets - 1 reps - 5 hold  GOALS: Goals reviewed with patient? Yes  SHORT TERM GOALS: Target date: 05/22/2023    Patient will be independent in home exercise program to improve strength/mobility for better functional  independence with ADLs. Baseline: Goal status: INITIAL    LONG TERM GOALS: Target date: 07/17/2023    Patient will increase Functional Gait Assessment score to >25/30 as to reduce fall risk and improve dynamic gait safety with community ambulation. Baseline: 1/28: 14/30  Goal status: INITIAL  2.   Patient (< 33 years old) will complete five times sit to stand test in < 10 seconds indicating an increased LE strength and improved balance. Baseline: 1/28: 13 seconds Goal status: INITIAL  3.  Patient will increase six minute walk test distance to >1000 for progression to community ambulator and improve gait ability Baseline: Perform next session 3/4: 820 ft Goal status: INITIAL  4.  Patient will return to nightly ambulation's with less than five foot scuffs per ambulation to return to PLOF.  Baseline: 1/28: limited ambulation  Goal status: INITIAL  5. Patient will increase ABC scale score >80% to demonstrate better functional mobility and better confidence with ADLs.  Baseline: 1/28: 60%  Goal status: INITIAL   ASSESSMENT:  CLINICAL IMPRESSION:  Patient encouraged to set alarm for HEP compliance, is agreeable to plan. Patient is improving with foot/toe mobility with decreased delay in response. Single leg stance and unstable surfaces tolerated well today. Patient will benefit from skilled physical therapy to increase LE strength, mobility, and stability for optimal quality of life.   OBJECTIVE IMPAIRMENTS: Abnormal gait, decreased activity tolerance, decreased balance, decreased coordination, decreased endurance, decreased mobility, difficulty walking, decreased strength, impaired perceived functional ability, impaired flexibility, impaired UE functional use, and improper body mechanics.   ACTIVITY LIMITATIONS: carrying, lifting, bending, sitting, standing, squatting, stairs, transfers, bed mobility, dressing, reach over head, hygiene/grooming, locomotion level, and caring for  others  PARTICIPATION LIMITATIONS: meal prep, cleaning, laundry, interpersonal relationship, driving, shopping, community activity, and occupation  PERSONAL FACTORS: Age, Fitness, Past/current experiences, Profession, Time since onset of injury/illness/exacerbation, and 1-2 comorbidities: MS anxiety  are also affecting patient's functional outcome.   REHAB POTENTIAL: Good  CLINICAL DECISION MAKING: Evolving/moderate complexity  EVALUATION COMPLEXITY: Moderate  PLAN:  PT FREQUENCY: 2x/week  PT DURATION: 12 weeks  PLANNED INTERVENTIONS: 97164- PT Re-evaluation, 97110-Therapeutic exercises, 97530- Therapeutic activity, 97112- Neuromuscular re-education, 97535- Self Care, 54098- Manual therapy, [phone]- Gait training, [phone]- Orthotic Fit/training, [phone]- Canalith repositioning, Patient/Family education, Balance training, Stair training, Taping, Dry Needling, Joint mobilization, Joint manipulation, Spinal mobilization, Vestibular training, Visual/preceptual remediation/compensation, DME instructions, Cryotherapy, and Moist heat  PLAN FOR NEXT SESSION: airex pad/balance, LLE strengthening, HEP , LLE coordination.    Precious Bard, PT 06/13/2023, 1:59 PM

## 2023-06-13 ENCOUNTER — Ambulatory Visit: Payer: No Typology Code available for payment source

## 2023-06-13 DIAGNOSIS — R269 Unspecified abnormalities of gait and mobility: Secondary | ICD-10-CM

## 2023-06-13 DIAGNOSIS — R262 Difficulty in walking, not elsewhere classified: Secondary | ICD-10-CM

## 2023-06-13 DIAGNOSIS — M6281 Muscle weakness (generalized): Secondary | ICD-10-CM

## 2023-06-13 DIAGNOSIS — R2681 Unsteadiness on feet: Secondary | ICD-10-CM

## 2023-06-18 ENCOUNTER — Ambulatory Visit: Payer: No Typology Code available for payment source

## 2023-06-18 DIAGNOSIS — R2681 Unsteadiness on feet: Secondary | ICD-10-CM

## 2023-06-18 DIAGNOSIS — R262 Difficulty in walking, not elsewhere classified: Secondary | ICD-10-CM

## 2023-06-18 DIAGNOSIS — M6281 Muscle weakness (generalized): Secondary | ICD-10-CM

## 2023-06-18 DIAGNOSIS — R269 Unspecified abnormalities of gait and mobility: Secondary | ICD-10-CM

## 2023-06-18 NOTE — Therapy (Unsigned)
 OUTPATIENT PHYSICAL THERAPY NEURO TREATMENT   Patient Name: Michelle Ruiz MRN: [id number] DOB:[birth year], 53 y.o., female Today's Date: 06/18/2023   PCP: Enid Baas  REFERRING PROVIDER: Cristopher Peru K  END OF SESSION:  PT End of Session - 06/18/23 1315     Visit Number 5    Number of Visits 24    Date for PT Re-Evaluation 07/17/23    PT Start Time 1316    PT Stop Time 1356    PT Time Calculation (min) 40 min    Equipment Utilized During Treatment Gait belt    Activity Tolerance Patient tolerated treatment well    Behavior During Therapy WFL for tasks assessed/performed                 Past Medical History:  Diagnosis Date   Abnormal Pap smear of cervix    Actinic keratosis    Anxiety    Basal cell carcinoma 04/14/2008   Right upper ant. thigh. BCC with sclerosis.   Basal cell carcinoma 08/14/2008   Right lat. lower thigh. Superficial.    Basal cell carcinoma 04/29/2019   Right mid forearm. Superficial and nodular patterns. EDC.   Basal cell carcinoma 08/17/2020   Right upper antecubital. EDC.   Basal cell carcinoma 08/23/2021   Left Lower Leg, EDC   Kidney stones    MS (multiple sclerosis) (HCC)    UTI (urinary tract infection)    Past Surgical History:  Procedure Laterality Date   BREAST BIOPSY Right 2010   stereotactic biopsy/ neg/ dr.byrnett   BREAST BIOPSY Left 2012   neg/dr. brynett   CESAREAN SECTION  2008   RPH   TONSILLECTOMY  2006   There are no active problems to display for this patient.   ONSET DATE: diagnosed 12 years ago.   REFERRING DIAG: MS, gait and mobility   THERAPY DIAG:  Difficulty in walking, not elsewhere classified  Unsteadiness on feet  Abnormality of gait and mobility  Muscle weakness (generalized)  Rationale for Evaluation and Treatment: Rehabilitation  SUBJECTIVE:                                                                                                                                                                                              SUBJECTIVE STATEMENT:  Patient reports that her walking is "crap" today (she refers to coordination this date).  Pt accompanied by: self  PERTINENT HISTORY: Patient was seen by physical therapy until September of last year. Pmh includes MS, UTI, anxiety. Has extreme spasticity in LLE. Extreme cold and heat affect her LLE. Patient is now taking hormonal medication. Patient  is a Chartered certified accountant.   PAIN:  Are you having pain? No  PRECAUTIONS: None  RED FLAGS: None   WEIGHT BEARING RESTRICTIONS: No  FALLS: Has patient fallen in last 6 months? No  LIVING ENVIRONMENT: Lives with: lives with their family Lives in: House/apartment Stairs:  no stairs to enter, lives on second floor Has following equipment at home: Single point cane and shower chair  PLOF: Independent  PATIENT GOALS: continue walking as long as she can.   OBJECTIVE:  Note: Objective measures were completed at Evaluation unless otherwise noted.  DIAGNOSTIC FINDINGS: n/a  COGNITION: Overall cognitive status: Within functional limits for tasks assessed   SENSATION: WFL; slight decrease to RLE   COORDINATION: Heel slide: limited LLE    MUSCLE TONE: LLE: Mild and Moderate   POSTURE: No Significant postural limitations  LOWER EXTREMITY ROM:     WFL  LOWER EXTREMITY MMT:    MMT Right Eval Left Eval  Hip flexion 6.4 4.2  Hip extension    Hip abduction 7.9 3.4  Hip adduction 7.5 8.5  Hip internal rotation    Hip external rotation    Knee flexion 11.1 10.2  Knee extension 7.8 10  Ankle dorsiflexion 10.8 6.8  Ankle plantarflexion 7.2 6.9  Ankle inversion    Ankle eversion    (Blank rows = not tested)  BED MOBILITY:  Scooting into bed is very challenging at end of night  TRANSFERS: Assistive device utilized: Single point cane  Sit to stand: Complete Independence Stand to sit: Complete Independence Chair to chair: Complete  Independence    STAIRS: Level of Assistance: CGA Stair Negotiation Technique: Alternating Pattern  with Single Rail on Right Number of Stairs: 5  Height of Stairs: 6 inch    GAIT: Gait pattern: step through pattern and poor foot clearance- Left Distance walked: 60 ft Assistive device utilized: None Level of assistance: CGA Comments: L foot scuffing  FUNCTIONAL TESTS:  5 times sit to stand: 13 seconds with arms crossed.  6 minute walk test: next session  Functional gait assessment: 14/30  PATIENT SURVEYS:  ABC scale 60.5%                                                                                                                               TREATMENT DATE: 06/18/23  Neuro Re-ed Standing with CGA next to support surface:  Airex pad: step ups x 10 leading with each LE, word game standing on airex  Sidestepping on airex balance beam x 2 with finger touch support - improved balance while having conversation during second bout   Half foam roller: df/pf 30x seated; 20x standing   TherAct  Toe abduction 20x Toe extension 20x;  Towel scrunch 20x  Seated ankle circles (clockwise) on dynadisc 2 x 10 each LE   PATIENT EDUCATION: Education details: goals, POC Person educated: Patient Education method: Explanation, Demonstration, Tactile cues, and Verbal cues Education comprehension: verbalized understanding, returned demonstration,  verbal cues required, and tactile cues required   HOME EXERCISE PROGRAM: Access Code: [email or web address] URL: [email or web address] Date: 05/29/2023 Prepared by: Precious Bard  Exercises - Towel Scrunches  - 1 x daily - 7 x weekly - 2 sets - 10 reps - 5 hold - Toe Spreading  - 1 x daily - 7 x weekly - 2 sets - 10 reps - 5 hold - Seated Ankle Alphabet  - 1 x daily - 7 x weekly - 2 sets - 1 reps - 5 hold  GOALS: Goals reviewed with patient? Yes  SHORT TERM GOALS: Target date: 05/22/2023    Patient will be independent in home  exercise program to improve strength/mobility for better functional independence with ADLs. Baseline: Goal status: INITIAL    LONG TERM GOALS: Target date: 07/17/2023    Patient will increase Functional Gait Assessment score to >25/30 as to reduce fall risk and improve dynamic gait safety with community ambulation. Baseline: 1/28: 14/30  Goal status: INITIAL  2.   Patient (< 50 years old) will complete five times sit to stand test in < 10 seconds indicating an increased LE strength and improved balance. Baseline: 1/28: 13 seconds Goal status: INITIAL  3.  Patient will increase six minute walk test distance to >1000 for progression to community ambulator and improve gait ability Baseline: Perform next session 3/4: 820 ft Goal status: INITIAL  4.  Patient will return to nightly ambulation's with less than five foot scuffs per ambulation to return to PLOF.  Baseline: 1/28: limited ambulation  Goal status: INITIAL  5. Patient will increase ABC scale score >80% to demonstrate better functional mobility and better confidence with ADLs.  Baseline: 1/28: 60%  Goal status: INITIAL   ASSESSMENT:  CLINICAL IMPRESSION:   Patient arrives to treatment session motivated to participate. Session focused on balance on unlevel surface and ankle mobility/coordination. Addition of dual tasking in future sessions to challenge balance further. Patient will benefit from skilled physical therapy to increase LE strength, mobility, and stability for optimal quality of life.   OBJECTIVE IMPAIRMENTS: Abnormal gait, decreased activity tolerance, decreased balance, decreased coordination, decreased endurance, decreased mobility, difficulty walking, decreased strength, impaired perceived functional ability, impaired flexibility, impaired UE functional use, and improper body mechanics.   ACTIVITY LIMITATIONS: carrying, lifting, bending, sitting, standing, squatting, stairs, transfers, bed mobility, dressing,  reach over head, hygiene/grooming, locomotion level, and caring for others  PARTICIPATION LIMITATIONS: meal prep, cleaning, laundry, interpersonal relationship, driving, shopping, community activity, and occupation  PERSONAL FACTORS: Age, Fitness, Past/current experiences, Profession, Time since onset of injury/illness/exacerbation, and 1-2 comorbidities: MS anxiety  are also affecting patient's functional outcome.   REHAB POTENTIAL: Good  CLINICAL DECISION MAKING: Evolving/moderate complexity  EVALUATION COMPLEXITY: Moderate  PLAN:  PT FREQUENCY: 2x/week  PT DURATION: 12 weeks  PLANNED INTERVENTIONS: 97164- PT Re-evaluation, 97110-Therapeutic exercises, 97530- Therapeutic activity, 97112- Neuromuscular re-education, 97535- Self Care, 69629- Manual therapy, [phone]- Gait training, [phone]- Orthotic Fit/training, [phone]- Canalith repositioning, Patient/Family education, Balance training, Stair training, Taping, Dry Needling, Joint mobilization, Joint manipulation, Spinal mobilization, Vestibular training, Visual/preceptual remediation/compensation, DME instructions, Cryotherapy, and Moist heat  PLAN FOR NEXT SESSION: airex pad/balance, LLE strengthening, HEP , LLE coordination. Adding cognitive/manual dual task with dynamic balance activities    Maylon Peppers, PT, DPT Physical Therapist - [place]  [facility] 06/18/2023, 1:16 PM

## 2023-06-19 NOTE — Therapy (Signed)
 OUTPATIENT PHYSICAL THERAPY NEURO TREATMENT   Patient Name: Michelle Ruiz MRN: [id number] DOB:Apr 17, 1970, 53 y.o., female Today's Date: 06/20/2023   PCP: Enid Baas  REFERRING PROVIDER: Cristopher Peru K  END OF SESSION:  PT End of Session - 06/20/23 1400     Visit Number 6    Number of Visits 24    Date for PT Re-Evaluation 07/17/23    PT Start Time 1400    PT Stop Time 1444    PT Time Calculation (min) 44 min    Equipment Utilized During Treatment Gait belt    Activity Tolerance Patient tolerated treatment well    Behavior During Therapy WFL for tasks assessed/performed                  Past Medical History:  Diagnosis Date   Abnormal Pap smear of cervix    Actinic keratosis    Anxiety    Basal cell carcinoma 04/14/2008   Right upper ant. thigh. BCC with sclerosis.   Basal cell carcinoma 08/14/2008   Right lat. lower thigh. Superficial.    Basal cell carcinoma 04/29/2019   Right mid forearm. Superficial and nodular patterns. EDC.   Basal cell carcinoma 08/17/2020   Right upper antecubital. EDC.   Basal cell carcinoma 08/23/2021   Left Lower Leg, EDC   Kidney stones    MS (multiple sclerosis) (HCC)    UTI (urinary tract infection)    Past Surgical History:  Procedure Laterality Date   BREAST BIOPSY Right 2010   stereotactic biopsy/ neg/ dr.byrnett   BREAST BIOPSY Left 2012   neg/dr. brynett   CESAREAN SECTION  2008   RPH   TONSILLECTOMY  2006   There are no active problems to display for this patient.   ONSET DATE: diagnosed 12 years ago.   REFERRING DIAG: MS, gait and mobility   THERAPY DIAG:  Difficulty in walking, not elsewhere classified  Unsteadiness on feet  Abnormality of gait and mobility  Rationale for Evaluation and Treatment: Rehabilitation  SUBJECTIVE:                                                                                                                                                                                              SUBJECTIVE STATEMENT:  Patient reports she is having hip and knee pain; feels sore.   Pt accompanied by: self  PERTINENT HISTORY: Patient was seen by physical therapy until September of last year. Pmh includes MS, UTI, anxiety. Has extreme spasticity in LLE. Extreme cold and heat affect her LLE. Patient is now taking hormonal medication. Patient is a Chartered certified accountant.  PAIN:  Are you having pain? No  PRECAUTIONS: None  RED FLAGS: None   WEIGHT BEARING RESTRICTIONS: No  FALLS: Has patient fallen in last 6 months? No  LIVING ENVIRONMENT: Lives with: lives with their family Lives in: House/apartment Stairs:  no stairs to enter, lives on second floor Has following equipment at home: Single point cane and shower chair  PLOF: Independent  PATIENT GOALS: continue walking as long as she can.   OBJECTIVE:  Note: Objective measures were completed at Evaluation unless otherwise noted.  DIAGNOSTIC FINDINGS: n/a  COGNITION: Overall cognitive status: Within functional limits for tasks assessed   SENSATION: WFL; slight decrease to RLE   COORDINATION: Heel slide: limited LLE    MUSCLE TONE: LLE: Mild and Moderate   POSTURE: No Significant postural limitations  LOWER EXTREMITY ROM:     WFL  LOWER EXTREMITY MMT:    MMT Right Eval Left Eval  Hip flexion 6.4 4.2  Hip extension    Hip abduction 7.9 3.4  Hip adduction 7.5 8.5  Hip internal rotation    Hip external rotation    Knee flexion 11.1 10.2  Knee extension 7.8 10  Ankle dorsiflexion 10.8 6.8  Ankle plantarflexion 7.2 6.9  Ankle inversion    Ankle eversion    (Blank rows = not tested)  BED MOBILITY:  Scooting into bed is very challenging at end of night  TRANSFERS: Assistive device utilized: Single point cane  Sit to stand: Complete Independence Stand to sit: Complete Independence Chair to chair: Complete Independence    STAIRS: Level of Assistance: CGA Stair Negotiation  Technique: Alternating Pattern  with Single Rail on Right Number of Stairs: 5  Height of Stairs: 6 inch    GAIT: Gait pattern: step through pattern and poor foot clearance- Left Distance walked: 60 ft Assistive device utilized: None Level of assistance: CGA Comments: L foot scuffing  FUNCTIONAL TESTS:  5 times sit to stand: 13 seconds with arms crossed.  6 minute walk test: next session  Functional gait assessment: 14/30  PATIENT SURVEYS:  ABC scale 60.5%                                                                                                                               TREATMENT DATE: 06/20/23  TherEx: RLE hamstring stretch 60 seconds R knee to chest 60 seconds R piriformis stretch 60 seconds GTB abduction; single limb at a time 10x ;2 sets  GTB march 10x; 2 sets each LE; single leg at atime Heel slide 10x; 2 sets  Posterior pelvic tilt 10; 2 sets  Glute squeeze 10x; 5 seconds  TrA with swiss ball 10x 5 second holds  Hamstring curl with green swiss ball 10x   PATIENT EDUCATION: Education details: goals, POC Person educated: Patient Education method: Explanation, Demonstration, Tactile cues, and Verbal cues Education comprehension: verbalized understanding, returned demonstration, verbal cues required, and tactile cues required   HOME EXERCISE PROGRAM: Access Code:  [email or web address] URL: [email or web address] Date: 05/29/2023 Prepared by: Precious Bard  Exercises - Towel Scrunches  - 1 x daily - 7 x weekly - 2 sets - 10 reps - 5 hold - Toe Spreading  - 1 x daily - 7 x weekly - 2 sets - 10 reps - 5 hold - Seated Ankle Alphabet  - 1 x daily - 7 x weekly - 2 sets - 1 reps - 5 hold  GOALS: Goals reviewed with patient? Yes  SHORT TERM GOALS: Target date: 05/22/2023    Patient will be independent in home exercise program to improve strength/mobility for better functional independence with ADLs. Baseline: Goal status: INITIAL    LONG TERM GOALS:  Target date: 07/17/2023    Patient will increase Functional Gait Assessment score to >25/30 as to reduce fall risk and improve dynamic gait safety with community ambulation. Baseline: 1/28: 14/30  Goal status: INITIAL  2.   Patient (< 28 years old) will complete five times sit to stand test in < 10 seconds indicating an increased LE strength and improved balance. Baseline: 1/28: 13 seconds Goal status: INITIAL  3.  Patient will increase six minute walk test distance to >1000 for progression to community ambulator and improve gait ability Baseline: Perform next session 3/4: 820 ft Goal status: INITIAL  4.  Patient will return to nightly ambulation's with less than five foot scuffs per ambulation to return to PLOF.  Baseline: 1/28: limited ambulation  Goal status: INITIAL  5. Patient will increase ABC scale score >80% to demonstrate better functional mobility and better confidence with ADLs.  Baseline: 1/28: 60%  Goal status: INITIAL   ASSESSMENT:  CLINICAL IMPRESSION:   Patient is having diffuse RLE pain requiring reduction of interventions to gravity eliminated position. She has no pain increase during session allowing for gentle strengthening.  Patient will benefit from skilled physical therapy to increase LE strength, mobility, and stability for optimal quality of life.   OBJECTIVE IMPAIRMENTS: Abnormal gait, decreased activity tolerance, decreased balance, decreased coordination, decreased endurance, decreased mobility, difficulty walking, decreased strength, impaired perceived functional ability, impaired flexibility, impaired UE functional use, and improper body mechanics.   ACTIVITY LIMITATIONS: carrying, lifting, bending, sitting, standing, squatting, stairs, transfers, bed mobility, dressing, reach over head, hygiene/grooming, locomotion level, and caring for others  PARTICIPATION LIMITATIONS: meal prep, cleaning, laundry, interpersonal relationship, driving, shopping,  community activity, and occupation  PERSONAL FACTORS: Age, Fitness, Past/current experiences, Profession, Time since onset of injury/illness/exacerbation, and 1-2 comorbidities: MS anxiety  are also affecting patient's functional outcome.   REHAB POTENTIAL: Good  CLINICAL DECISION MAKING: Evolving/moderate complexity  EVALUATION COMPLEXITY: Moderate  PLAN:  PT FREQUENCY: 2x/week  PT DURATION: 12 weeks  PLANNED INTERVENTIONS: 97164- PT Re-evaluation, 97110-Therapeutic exercises, 97530- Therapeutic activity, 97112- Neuromuscular re-education, 97535- Self Care, 47829- Manual therapy, [phone]- Gait training, [phone]- Orthotic Fit/training, [phone]- Canalith repositioning, Patient/Family education, Balance training, Stair training, Taping, Dry Needling, Joint mobilization, Joint manipulation, Spinal mobilization, Vestibular training, Visual/preceptual remediation/compensation, DME instructions, Cryotherapy, and Moist heat  PLAN FOR NEXT SESSION: airex pad/balance, LLE strengthening, HEP , LLE coordination. Adding cognitive/manual dual task with dynamic balance activities   Precious Bard, PT, DPT Physical Therapist - [facility]  Outpatient Physical Therapy- Main [facility] [phone]    06/20/2023, 3:31 PM

## 2023-06-20 ENCOUNTER — Ambulatory Visit: Payer: No Typology Code available for payment source

## 2023-06-20 DIAGNOSIS — R269 Unspecified abnormalities of gait and mobility: Secondary | ICD-10-CM

## 2023-06-20 DIAGNOSIS — R262 Difficulty in walking, not elsewhere classified: Secondary | ICD-10-CM | POA: Diagnosis not present

## 2023-06-20 DIAGNOSIS — R2681 Unsteadiness on feet: Secondary | ICD-10-CM

## 2023-06-25 ENCOUNTER — Ambulatory Visit: Payer: No Typology Code available for payment source

## 2023-06-25 DIAGNOSIS — R262 Difficulty in walking, not elsewhere classified: Secondary | ICD-10-CM

## 2023-06-25 DIAGNOSIS — R2681 Unsteadiness on feet: Secondary | ICD-10-CM

## 2023-06-25 NOTE — Therapy (Signed)
 OUTPATIENT PHYSICAL THERAPY NEURO TREATMENT   Patient Name: Michelle Ruiz MRN: [id number] DOB:January 04, 1971, 53 y.o., female Today's Date: 06/25/2023   PCP: Enid Baas  REFERRING PROVIDER: Cristopher Peru K  END OF SESSION:  PT End of Session - 06/25/23 1322     Visit Number 7    Number of Visits 24    Date for PT Re-Evaluation 07/17/23    PT Start Time 1319    PT Stop Time 1400    PT Time Calculation (min) 41 min    Equipment Utilized During Treatment Gait belt    Activity Tolerance Patient tolerated treatment well    Behavior During Therapy WFL for tasks assessed/performed              Past Medical History:  Diagnosis Date   Abnormal Pap smear of cervix    Actinic keratosis    Anxiety    Basal cell carcinoma 04/14/2008   Right upper ant. thigh. BCC with sclerosis.   Basal cell carcinoma 08/14/2008   Right lat. lower thigh. Superficial.    Basal cell carcinoma 04/29/2019   Right mid forearm. Superficial and nodular patterns. EDC.   Basal cell carcinoma 08/17/2020   Right upper antecubital. EDC.   Basal cell carcinoma 08/23/2021   Left Lower Leg, EDC   Kidney stones    MS (multiple sclerosis) (HCC)    UTI (urinary tract infection)    Past Surgical History:  Procedure Laterality Date   BREAST BIOPSY Right 2010   stereotactic biopsy/ neg/ dr.byrnett   BREAST BIOPSY Left 2012   neg/dr. brynett   CESAREAN SECTION  2008   RPH   TONSILLECTOMY  2006   There are no active problems to display for this patient.   ONSET DATE: diagnosed 12 years ago.   REFERRING DIAG: MS, gait and mobility   THERAPY DIAG:  Difficulty in walking, not elsewhere classified  Unsteadiness on feet  Rationale for Evaluation and Treatment: Rehabilitation  SUBJECTIVE:                                                                                                                                                                                             SUBJECTIVE STATEMENT:    Pt reports that her R hip and R knee are still bothering her.  Pt denies attempting to stretch and has not really thought about doing so.     Pt accompanied by: self  PERTINENT HISTORY: Patient was seen by physical therapy until September of last year. Pmh includes MS, UTI, anxiety. Has extreme spasticity in LLE. Extreme cold and heat affect her LLE. Patient is now  taking hormonal medication. Patient is a Chartered certified accountant.   PAIN:  Are you having pain? No  PRECAUTIONS: None  RED FLAGS: None   WEIGHT BEARING RESTRICTIONS: No  FALLS: Has patient fallen in last 6 months? No  LIVING ENVIRONMENT: Lives with: lives with their family Lives in: House/apartment Stairs:  no stairs to enter, lives on second floor Has following equipment at home: Single point cane and shower chair  PLOF: Independent  PATIENT GOALS: continue walking as long as she can.   OBJECTIVE:  Note: Objective measures were completed at Evaluation unless otherwise noted.  DIAGNOSTIC FINDINGS: n/a  COGNITION: Overall cognitive status: Within functional limits for tasks assessed   SENSATION: WFL; slight decrease to RLE   COORDINATION: Heel slide: limited LLE    MUSCLE TONE: LLE: Mild and Moderate   POSTURE: No Significant postural limitations  LOWER EXTREMITY ROM:     WFL  LOWER EXTREMITY MMT:    MMT Right Eval Left Eval  Hip flexion 6.4 4.2  Hip extension    Hip abduction 7.9 3.4  Hip adduction 7.5 8.5  Hip internal rotation    Hip external rotation    Knee flexion 11.1 10.2  Knee extension 7.8 10  Ankle dorsiflexion 10.8 6.8  Ankle plantarflexion 7.2 6.9  Ankle inversion    Ankle eversion    (Blank rows = not tested)  BED MOBILITY:  Scooting into bed is very challenging at end of night  TRANSFERS: Assistive device utilized: Single point cane  Sit to stand: Complete Independence Stand to sit: Complete Independence Chair to chair: Complete Independence    STAIRS: Level  of Assistance: CGA Stair Negotiation Technique: Alternating Pattern  with Single Rail on Right Number of Stairs: 5  Height of Stairs: 6 inch    GAIT: Gait pattern: step through pattern and poor foot clearance- Left Distance walked: 60 ft Assistive device utilized: None Level of assistance: CGA Comments: L foot scuffing  FUNCTIONAL TESTS:  5 times sit to stand: 13 seconds with arms crossed.  6 minute walk test: next session  Functional gait assessment: 14/30  PATIENT SURVEYS:  ABC scale 60.5%                                                                                                                               TREATMENT DATE: 06/25/23    TherEx:  Seated leg press, 70# x5; 62.5# x15; pt noting some increased pain with initial push-off in the R knee, so assistance was provided for initial push and pt continued with eccentric STS with airex pad under feet, x10 with support applied to the R knee by therapist.  Seated B Hamstring stretches, 60 sec bout each LE Seated Figure 4 stretch, 60 sec bout each LE     Neuro Re-ed  Ambulation down main hallway with fruits/vegetables/color recalls while ambulating, length of the hallway x4 Side-stepping with animal/country recalls, length of hallway x2 Backwards ambulation for increased proprioception  of the LE's, length of hallway, x1 Half foam roller: df/pf 30x seated   PATIENT EDUCATION: Education details: goals, POC Person educated: Patient Education method: Explanation, Demonstration, Tactile cues, and Verbal cues Education comprehension: verbalized understanding, returned demonstration, verbal cues required, and tactile cues required   HOME EXERCISE PROGRAM: Access Code: [email or web address] URL: [email or web address] Date: 05/29/2023 Prepared by: Precious Bard  Exercises - Towel Scrunches  - 1 x daily - 7 x weekly - 2 sets - 10 reps - 5 hold - Toe Spreading  - 1 x daily - 7 x weekly - 2 sets - 10 reps - 5  hold - Seated Ankle Alphabet  - 1 x daily - 7 x weekly - 2 sets - 1 reps - 5 hold  GOALS: Goals reviewed with patient? Yes  SHORT TERM GOALS: Target date: 05/22/2023  Patient will be independent in home exercise program to improve strength/mobility for better functional independence with ADLs. Baseline: Goal status: INITIAL    LONG TERM GOALS: Target date: 07/17/2023  Patient will increase Functional Gait Assessment score to >25/30 as to reduce fall risk and improve dynamic gait safety with community ambulation. Baseline: 1/28: 14/30  Goal status: INITIAL  2.   Patient (< 80 years old) will complete five times sit to stand test in < 10 seconds indicating an increased LE strength and improved balance. Baseline: 1/28: 13 seconds Goal status: INITIAL  3.  Patient will increase six minute walk test distance to >1000 for progression to community ambulator and improve gait ability Baseline: Perform next session 3/4: 820 ft Goal status: INITIAL  4.  Patient will return to nightly ambulation's with less than five foot scuffs per ambulation to return to PLOF.  Baseline: 1/28: limited ambulation  Goal status: INITIAL  5. Patient will increase ABC scale score >80% to demonstrate better functional mobility and better confidence with ADLs.  Baseline: 1/28: 60%  Goal status: INITIAL   ASSESSMENT:  CLINICAL IMPRESSION:    Pt continues to struggle with gait when having to perform a recall or other dual task.  Pt also encouraged to stretch the LE's when they hurt at home in order to reduce the pain and improve tissue extensibility. Pt otherwise performed well and was able to participate in strength training without any significant complications.  Pt will continue to benefit from skilled therapy to address remaining deficits in order to improve overall QoL and return to PLOF.      OBJECTIVE IMPAIRMENTS: Abnormal gait, decreased activity tolerance, decreased balance, decreased coordination,  decreased endurance, decreased mobility, difficulty walking, decreased strength, impaired perceived functional ability, impaired flexibility, impaired UE functional use, and improper body mechanics.   ACTIVITY LIMITATIONS: carrying, lifting, bending, sitting, standing, squatting, stairs, transfers, bed mobility, dressing, reach over head, hygiene/grooming, locomotion level, and caring for others  PARTICIPATION LIMITATIONS: meal prep, cleaning, laundry, interpersonal relationship, driving, shopping, community activity, and occupation  PERSONAL FACTORS: Age, Fitness, Past/current experiences, Profession, Time since onset of injury/illness/exacerbation, and 1-2 comorbidities: MS anxiety  are also affecting patient's functional outcome.   REHAB POTENTIAL: Good  CLINICAL DECISION MAKING: Evolving/moderate complexity  EVALUATION COMPLEXITY: Moderate  PLAN:  PT FREQUENCY: 2x/week  PT DURATION: 12 weeks  PLANNED INTERVENTIONS: 97164- PT Re-evaluation, 97110-Therapeutic exercises, 97530- Therapeutic activity, 97112- Neuromuscular re-education, 97535- Self Care, 52841- Manual therapy, [phone]- Gait training, [phone]- Orthotic Fit/training, [phone]- Canalith repositioning, Patient/Family education, Balance training, Stair training, Taping, Dry Needling, Joint mobilization, Joint manipulation, Spinal mobilization, Vestibular training, Visual/preceptual remediation/compensation, DME instructions, Cryotherapy, and Moist heat  PLAN FOR NEXT SESSION:  airex pad/balance, LLE strengthening, HEP , LLE coordination. Adding cognitive/manual dual task with dynamic balance activities    Nolon Bussing, PT, DPT Physical Therapist - [facility]  06/25/23, 2:03 PM

## 2023-06-26 NOTE — Therapy (Signed)
 OUTPATIENT PHYSICAL THERAPY NEURO TREATMENT   Patient Name: Michelle Ruiz MRN: [id number] DOB:06/09/1970, 53 y.o., female Today's Date: 06/27/2023   PCP: Enid Baas  REFERRING PROVIDER: Cristopher Peru K  END OF SESSION:  PT End of Session - 06/27/23 1403     Visit Number 8    Number of Visits 24    Date for PT Re-Evaluation 07/17/23    PT Start Time 1402    PT Stop Time 1444    PT Time Calculation (min) 42 min    Equipment Utilized During Treatment Gait belt    Activity Tolerance Patient tolerated treatment well    Behavior During Therapy WFL for tasks assessed/performed               Past Medical History:  Diagnosis Date   Abnormal Pap smear of cervix    Actinic keratosis    Anxiety    Basal cell carcinoma 04/14/2008   Right upper ant. thigh. BCC with sclerosis.   Basal cell carcinoma 08/14/2008   Right lat. lower thigh. Superficial.    Basal cell carcinoma 04/29/2019   Right mid forearm. Superficial and nodular patterns. EDC.   Basal cell carcinoma 08/17/2020   Right upper antecubital. EDC.   Basal cell carcinoma 08/23/2021   Left Lower Leg, EDC   Kidney stones    MS (multiple sclerosis) (HCC)    UTI (urinary tract infection)    Past Surgical History:  Procedure Laterality Date   BREAST BIOPSY Right 2010   stereotactic biopsy/ neg/ dr.byrnett   BREAST BIOPSY Left 2012   neg/dr. brynett   CESAREAN SECTION  2008   RPH   TONSILLECTOMY  2006   There are no active problems to display for this patient.   ONSET DATE: diagnosed 12 years ago.   REFERRING DIAG: MS, gait and mobility   THERAPY DIAG:  Difficulty in walking, not elsewhere classified  Abnormality of gait and mobility  Unsteadiness on feet  Rationale for Evaluation and Treatment: Rehabilitation  SUBJECTIVE:                                                                                                                                                                                              SUBJECTIVE STATEMENT:   Patient reports she felt exhausted from previous session.     Pt accompanied by: self  PERTINENT HISTORY: Patient was seen by physical therapy until September of last year. Pmh includes MS, UTI, anxiety. Has extreme spasticity in LLE. Extreme cold and heat affect her LLE. Patient is now taking hormonal medication. Patient is a Chartered certified accountant.   PAIN:  Are you having pain? No  PRECAUTIONS: None  RED FLAGS: None   WEIGHT BEARING RESTRICTIONS: No  FALLS: Has patient fallen in last 6 months? No  LIVING ENVIRONMENT: Lives with: lives with their family Lives in: House/apartment Stairs:  no stairs to enter, lives on second floor Has following equipment at home: Single point cane and shower chair  PLOF: Independent  PATIENT GOALS: continue walking as long as she can.   OBJECTIVE:  Note: Objective measures were completed at Evaluation unless otherwise noted.  DIAGNOSTIC FINDINGS: n/a  COGNITION: Overall cognitive status: Within functional limits for tasks assessed   SENSATION: WFL; slight decrease to RLE   COORDINATION: Heel slide: limited LLE    MUSCLE TONE: LLE: Mild and Moderate   POSTURE: No Significant postural limitations  LOWER EXTREMITY ROM:     WFL  LOWER EXTREMITY MMT:    MMT Right Eval Left Eval  Hip flexion 6.4 4.2  Hip extension    Hip abduction 7.9 3.4  Hip adduction 7.5 8.5  Hip internal rotation    Hip external rotation    Knee flexion 11.1 10.2  Knee extension 7.8 10  Ankle dorsiflexion 10.8 6.8  Ankle plantarflexion 7.2 6.9  Ankle inversion    Ankle eversion    (Blank rows = not tested)  BED MOBILITY:  Scooting into bed is very challenging at end of night  TRANSFERS: Assistive device utilized: Single point cane  Sit to stand: Complete Independence Stand to sit: Complete Independence Chair to chair: Complete Independence    STAIRS: Level of Assistance: CGA Stair Negotiation  Technique: Alternating Pattern  with Single Rail on Right Number of Stairs: 5  Height of Stairs: 6 inch    GAIT: Gait pattern: step through pattern and poor foot clearance- Left Distance walked: 60 ft Assistive device utilized: None Level of assistance: CGA Comments: L foot scuffing  FUNCTIONAL TESTS:  5 times sit to stand: 13 seconds with arms crossed.  6 minute walk test: next session  Functional gait assessment: 14/30  PATIENT SURVEYS:  ABC scale 60.5%                                                                                                                               TREATMENT DATE: 06/27/23   TherEx: RLE hamstring stretch 60 seconds R knee to chest 60 seconds R piriformis stretch 60 seconds Posterior pelvic tilt 10; 2 sets  BTB  abduction 20x   Seated:  Toe curls 20x Big toe extension 10x each side; assistance to R foot to hold other toes down  Eversion/inversion 10x each LE  Dynadisc: -df/pf 10x each LE -inversion/eversion 10x each LE  -clockwise/counterclockwise 10x each LE   PATIENT EDUCATION: Education details: goals, POC Person educated: Patient Education method: Explanation, Demonstration, Tactile cues, and Verbal cues Education comprehension: verbalized understanding, returned demonstration, verbal cues required, and tactile cues required   HOME EXERCISE PROGRAM: Access Code: [email or web address] URL: [email or web address] Date: 05/29/2023  Prepared by: Precious Bard  Exercises - Towel Scrunches  - 1 x daily - 7 x weekly - 2 sets - 10 reps - 5 hold - Toe Spreading  - 1 x daily - 7 x weekly - 2 sets - 10 reps - 5 hold - Seated Ankle Alphabet  - 1 x daily - 7 x weekly - 2 sets - 1 reps - 5 hold  GOALS: Goals reviewed with patient? Yes  SHORT TERM GOALS: Target date: 05/22/2023  Patient will be independent in home exercise program to improve strength/mobility for better functional independence with ADLs. Baseline: Goal status:  INITIAL    LONG TERM GOALS: Target date: 07/17/2023  Patient will increase Functional Gait Assessment score to >25/30 as to reduce fall risk and improve dynamic gait safety with community ambulation. Baseline: 1/28: 14/30  Goal status: INITIAL  2.   Patient (< 31 years old) will complete five times sit to stand test in < 10 seconds indicating an increased LE strength and improved balance. Baseline: 1/28: 13 seconds Goal status: INITIAL  3.  Patient will increase six minute walk test distance to >1000 for progression to community ambulator and improve gait ability Baseline: Perform next session 3/4: 820 ft Goal status: INITIAL  4.  Patient will return to nightly ambulation's with less than five foot scuffs per ambulation to return to PLOF.  Baseline: 1/28: limited ambulation  Goal status: INITIAL  5. Patient will increase ABC scale score >80% to demonstrate better functional mobility and better confidence with ADLs.  Baseline: 1/28: 60%  Goal status: INITIAL   ASSESSMENT:  CLINICAL IMPRESSION:    Patient demonstrates improved intrinsic foot control and ankle musculature control. She has no pain and/or cramping with muscle activation. She does have increased clonus with df/pf on dynadisc.   Pt will continue to benefit from skilled therapy to address remaining deficits in order to improve overall QoL and return to PLOF.      OBJECTIVE IMPAIRMENTS: Abnormal gait, decreased activity tolerance, decreased balance, decreased coordination, decreased endurance, decreased mobility, difficulty walking, decreased strength, impaired perceived functional ability, impaired flexibility, impaired UE functional use, and improper body mechanics.   ACTIVITY LIMITATIONS: carrying, lifting, bending, sitting, standing, squatting, stairs, transfers, bed mobility, dressing, reach over head, hygiene/grooming, locomotion level, and caring for others  PARTICIPATION LIMITATIONS: meal prep, cleaning, laundry,  interpersonal relationship, driving, shopping, community activity, and occupation  PERSONAL FACTORS: Age, Fitness, Past/current experiences, Profession, Time since onset of injury/illness/exacerbation, and 1-2 comorbidities: MS anxiety  are also affecting patient's functional outcome.   REHAB POTENTIAL: Good  CLINICAL DECISION MAKING: Evolving/moderate complexity  EVALUATION COMPLEXITY: Moderate  PLAN:  PT FREQUENCY: 2x/week  PT DURATION: 12 weeks  PLANNED INTERVENTIONS: 97164- PT Re-evaluation, 97110-Therapeutic exercises, 97530- Therapeutic activity, 97112- Neuromuscular re-education, 97535- Self Care, 40102- Manual therapy, [phone]- Gait training, [phone]- Orthotic Fit/training, [phone]- Canalith repositioning, Patient/Family education, Balance training, Stair training, Taping, Dry Needling, Joint mobilization, Joint manipulation, Spinal mobilization, Vestibular training, Visual/preceptual remediation/compensation, DME instructions, Cryotherapy, and Moist heat  PLAN FOR NEXT SESSION:  airex pad/balance, LLE strengthening, HEP , LLE coordination. Adding cognitive/manual dual task with dynamic balance activities   Precious Bard, PT, DPT Physical Therapist - [facility]  Outpatient Physical Therapy- [facility] [phone]    06/27/23, 2:53 PM

## 2023-06-27 ENCOUNTER — Ambulatory Visit: Payer: No Typology Code available for payment source | Attending: Neurology

## 2023-06-27 DIAGNOSIS — M6281 Muscle weakness (generalized): Secondary | ICD-10-CM | POA: Insufficient documentation

## 2023-06-27 DIAGNOSIS — R262 Difficulty in walking, not elsewhere classified: Secondary | ICD-10-CM | POA: Insufficient documentation

## 2023-06-27 DIAGNOSIS — R2681 Unsteadiness on feet: Secondary | ICD-10-CM | POA: Diagnosis present

## 2023-06-27 DIAGNOSIS — R269 Unspecified abnormalities of gait and mobility: Secondary | ICD-10-CM | POA: Insufficient documentation

## 2023-07-02 ENCOUNTER — Ambulatory Visit: Payer: No Typology Code available for payment source | Admitting: Physical Therapy

## 2023-07-02 ENCOUNTER — Other Ambulatory Visit: Payer: Self-pay

## 2023-07-02 DIAGNOSIS — Z87891 Personal history of nicotine dependence: Secondary | ICD-10-CM

## 2023-07-02 DIAGNOSIS — Z122 Encounter for screening for malignant neoplasm of respiratory organs: Secondary | ICD-10-CM

## 2023-07-03 NOTE — Therapy (Signed)
 OUTPATIENT PHYSICAL THERAPY NEURO TREATMENT   Patient Name: Michelle Ruiz MRN: [id number] DOB:October 29, 1970, 53 y.o., female Today's Date: 07/04/2023   PCP: Enid Baas  REFERRING PROVIDER: Cristopher Peru K  END OF SESSION:  PT End of Session - 07/04/23 1445     Visit Number 9    Number of Visits 24    Date for PT Re-Evaluation 07/17/23    PT Start Time 1445    PT Stop Time 1529    PT Time Calculation (min) 44 min    Equipment Utilized During Treatment Gait belt    Activity Tolerance Patient tolerated treatment well    Behavior During Therapy WFL for tasks assessed/performed                Past Medical History:  Diagnosis Date   Abnormal Pap smear of cervix    Actinic keratosis    Anxiety    Basal cell carcinoma 04/14/2008   Right upper ant. thigh. BCC with sclerosis.   Basal cell carcinoma 08/14/2008   Right lat. lower thigh. Superficial.    Basal cell carcinoma 04/29/2019   Right mid forearm. Superficial and nodular patterns. EDC.   Basal cell carcinoma 08/17/2020   Right upper antecubital. EDC.   Basal cell carcinoma 08/23/2021   Left Lower Leg, EDC   Kidney stones    MS (multiple sclerosis) (HCC)    UTI (urinary tract infection)    Past Surgical History:  Procedure Laterality Date   BREAST BIOPSY Right 2010   stereotactic biopsy/ neg/ dr.byrnett   BREAST BIOPSY Left 2012   neg/dr. brynett   CESAREAN SECTION  2008   RPH   TONSILLECTOMY  2006   There are no active problems to display for this patient.   ONSET DATE: diagnosed 12 years ago.   REFERRING DIAG: MS, gait and mobility   THERAPY DIAG:  Difficulty in walking, not elsewhere classified  Abnormality of gait and mobility  Unsteadiness on feet  Muscle weakness (generalized)  Rationale for Evaluation and Treatment: Rehabilitation  SUBJECTIVE:                                                                                                                                                                                              SUBJECTIVE STATEMENT:   Patient missed last session due to coming at wrong time. Had cramps when doing toe extension.   Pt accompanied by: self  PERTINENT HISTORY: Patient was seen by physical therapy until September of last year. Pmh includes MS, UTI, anxiety. Has extreme spasticity in LLE. Extreme cold and heat affect her LLE. Patient is now taking  hormonal medication. Patient is a Chartered certified accountant.   PAIN:  Are you having pain? No  PRECAUTIONS: None  RED FLAGS: None   WEIGHT BEARING RESTRICTIONS: No  FALLS: Has patient fallen in last 6 months? No  LIVING ENVIRONMENT: Lives with: lives with their family Lives in: House/apartment Stairs:  no stairs to enter, lives on second floor Has following equipment at home: Single point cane and shower chair  PLOF: Independent  PATIENT GOALS: continue walking as long as she can.   OBJECTIVE:  Note: Objective measures were completed at Evaluation unless otherwise noted.  DIAGNOSTIC FINDINGS: n/a  COGNITION: Overall cognitive status: Within functional limits for tasks assessed   SENSATION: WFL; slight decrease to RLE   COORDINATION: Heel slide: limited LLE    MUSCLE TONE: LLE: Mild and Moderate   POSTURE: No Significant postural limitations  LOWER EXTREMITY ROM:     WFL  LOWER EXTREMITY MMT:    MMT Right Eval Left Eval  Hip flexion 6.4 4.2  Hip extension    Hip abduction 7.9 3.4  Hip adduction 7.5 8.5  Hip internal rotation    Hip external rotation    Knee flexion 11.1 10.2  Knee extension 7.8 10  Ankle dorsiflexion 10.8 6.8  Ankle plantarflexion 7.2 6.9  Ankle inversion    Ankle eversion    (Blank rows = not tested)  BED MOBILITY:  Scooting into bed is very challenging at end of night  TRANSFERS: Assistive device utilized: Single point cane  Sit to stand: Complete Independence Stand to sit: Complete Independence Chair to chair: Complete  Independence    STAIRS: Level of Assistance: CGA Stair Negotiation Technique: Alternating Pattern  with Single Rail on Right Number of Stairs: 5  Height of Stairs: 6 inch    GAIT: Gait pattern: step through pattern and poor foot clearance- Left Distance walked: 60 ft Assistive device utilized: None Level of assistance: CGA Comments: L foot scuffing  FUNCTIONAL TESTS:  5 times sit to stand: 13 seconds with arms crossed.  6 minute walk test: next session  Functional gait assessment: 14/30  PATIENT SURVEYS:  ABC scale 60.5%                                                                                                                               TREATMENT DATE: 07/04/23   TherEx: RLE hamstring stretch 60 seconds R knee to chest 60 seconds R piriformis stretch 60 seconds Posterior pelvic tilt 10; 2 sets  BTB  abduction 15x; 2 sets BTB march 15x; 2 sets  Adduction balls squeeze 15x; 2 sets  Seated:  Toe curls 20x Big toe extension 10x each side; assistance to R foot to hold other toes down  Toe abduction 10x L marble pick up 10x each LE; 2 sets Eversion/inversion 10x each LE  Adduction with ER/IR 10x     PATIENT EDUCATION: Education details: goals, POC Person educated: Patient Education method: Explanation, Demonstration, Tactile cues,  and Verbal cues Education comprehension: verbalized understanding, returned demonstration, verbal cues required, and tactile cues required   HOME EXERCISE PROGRAM: Access Code: [email or web address] URL: [email or web address] Date: 05/29/2023 Prepared by: Precious Bard  Exercises - Towel Scrunches  - 1 x daily - 7 x weekly - 2 sets - 10 reps - 5 hold - Toe Spreading  - 1 x daily - 7 x weekly - 2 sets - 10 reps - 5 hold - Seated Ankle Alphabet  - 1 x daily - 7 x weekly - 2 sets - 1 reps - 5 hold  GOALS: Goals reviewed with patient? Yes  SHORT TERM GOALS: Target date: 05/22/2023  Patient will be independent in home  exercise program to improve strength/mobility for better functional independence with ADLs. Baseline: Goal status: INITIAL    LONG TERM GOALS: Target date: 07/17/2023  Patient will increase Functional Gait Assessment score to >25/30 as to reduce fall risk and improve dynamic gait safety with community ambulation. Baseline: 1/28: 14/30  Goal status: INITIAL  2.   Patient (< 82 years old) will complete five times sit to stand test in < 10 seconds indicating an increased LE strength and improved balance. Baseline: 1/28: 13 seconds Goal status: INITIAL  3.  Patient will increase six minute walk test distance to >1000 for progression to community ambulator and improve gait ability Baseline: Perform next session 3/4: 820 ft Goal status: INITIAL  4.  Patient will return to nightly ambulation's with less than five foot scuffs per ambulation to return to PLOF.  Baseline: 1/28: limited ambulation  Goal status: INITIAL  5. Patient will increase ABC scale score >80% to demonstrate better functional mobility and better confidence with ADLs.  Baseline: 1/28: 60%  Goal status: INITIAL   ASSESSMENT:  CLINICAL IMPRESSION:    Patient is highly motivated throughout session. Marble pick up added to exercises, and is very challenging for patient. Coordination of L foot is improving with carryover between sessions. Pt will continue to benefit from skilled therapy to address remaining deficits in order to improve overall QoL and return to PLOF.      OBJECTIVE IMPAIRMENTS: Abnormal gait, decreased activity tolerance, decreased balance, decreased coordination, decreased endurance, decreased mobility, difficulty walking, decreased strength, impaired perceived functional ability, impaired flexibility, impaired UE functional use, and improper body mechanics.   ACTIVITY LIMITATIONS: carrying, lifting, bending, sitting, standing, squatting, stairs, transfers, bed mobility, dressing, reach over head,  hygiene/grooming, locomotion level, and caring for others  PARTICIPATION LIMITATIONS: meal prep, cleaning, laundry, interpersonal relationship, driving, shopping, community activity, and occupation  PERSONAL FACTORS: Age, Fitness, Past/current experiences, Profession, Time since onset of injury/illness/exacerbation, and 1-2 comorbidities: MS anxiety  are also affecting patient's functional outcome.   REHAB POTENTIAL: Good  CLINICAL DECISION MAKING: Evolving/moderate complexity  EVALUATION COMPLEXITY: Moderate  PLAN:  PT FREQUENCY: 2x/week  PT DURATION: 12 weeks  PLANNED INTERVENTIONS: 97164- PT Re-evaluation, 97110-Therapeutic exercises, 97530- Therapeutic activity, 97112- Neuromuscular re-education, 97535- Self Care, 08657- Manual therapy, [phone]- Gait training, [phone]- Orthotic Fit/training, [phone]- Canalith repositioning, Patient/Family education, Balance training, Stair training, Taping, Dry Needling, Joint mobilization, Joint manipulation, Spinal mobilization, Vestibular training, Visual/preceptual remediation/compensation, DME instructions, Cryotherapy, and Moist heat  PLAN FOR NEXT SESSION:  airex pad/balance, LLE strengthening, HEP , LLE coordination. Adding cognitive/manual dual task with dynamic balance activities   Precious Bard, PT, DPT Physical Therapist - [facility]  Outpatient Physical Therapy- [facility] [phone]    07/04/23, 3:29 PM

## 2023-07-04 ENCOUNTER — Ambulatory Visit: Payer: No Typology Code available for payment source

## 2023-07-04 DIAGNOSIS — R269 Unspecified abnormalities of gait and mobility: Secondary | ICD-10-CM

## 2023-07-04 DIAGNOSIS — R262 Difficulty in walking, not elsewhere classified: Secondary | ICD-10-CM | POA: Diagnosis not present

## 2023-07-04 DIAGNOSIS — M6281 Muscle weakness (generalized): Secondary | ICD-10-CM

## 2023-07-04 DIAGNOSIS — R2681 Unsteadiness on feet: Secondary | ICD-10-CM

## 2023-07-05 NOTE — Therapy (Signed)
 OUTPATIENT PHYSICAL THERAPY NEURO TREATMENT/ Physical Therapy Progress Note   Dates of reporting period  04/24/23   to   07/09/23     Patient Name: Michelle Ruiz MRN: [id number] DOB:Jan 13, 1971, 53 y.o., female Today's Date: 07/09/2023   PCP: Rex Castor  REFERRING PROVIDER: Devora Folks K  END OF SESSION:  PT End of Session - 07/09/23 1311     Visit Number 10    Number of Visits 24    Date for PT Re-Evaluation 07/17/23    PT Start Time 1314    PT Stop Time 1359    PT Time Calculation (min) 45 min    Equipment Utilized During Treatment Gait belt    Activity Tolerance Patient tolerated treatment well    Behavior During Therapy WFL for tasks assessed/performed                 Past Medical History:  Diagnosis Date   Abnormal Pap smear of cervix    Actinic keratosis    Anxiety    Basal cell carcinoma 04/14/2008   Right upper ant. thigh. BCC with sclerosis.   Basal cell carcinoma 08/14/2008   Right lat. lower thigh. Superficial.    Basal cell carcinoma 04/29/2019   Right mid forearm. Superficial and nodular patterns. EDC.   Basal cell carcinoma 08/17/2020   Right upper antecubital. EDC.   Basal cell carcinoma 08/23/2021   Left Lower Leg, EDC   Kidney stones    MS (multiple sclerosis) (HCC)    UTI (urinary tract infection)    Past Surgical History:  Procedure Laterality Date   BREAST BIOPSY Right 2010   stereotactic biopsy/ neg/ dr.byrnett   BREAST BIOPSY Left 2012   neg/dr. brynett   CESAREAN SECTION  2008   RPH   TONSILLECTOMY  2006   There are no active problems to display for this patient.   ONSET DATE: diagnosed 12 years ago.   REFERRING DIAG: MS, gait and mobility   THERAPY DIAG:  Difficulty in walking, not elsewhere classified  Abnormality of gait and mobility  Unsteadiness on feet  Muscle weakness (generalized)  Rationale for Evaluation and Treatment: Rehabilitation  SUBJECTIVE:                                                                                                                                                                                              SUBJECTIVE STATEMENT:   Patient reports no aches or pains, no falls or LOB   Pt accompanied by: self  PERTINENT HISTORY: Patient was seen by physical therapy until September of last year. Pmh includes MS, UTI, anxiety. Has  extreme spasticity in LLE. Extreme cold and heat affect her LLE. Patient is now taking hormonal medication. Patient is a Chartered certified accountant.   PAIN:  Are you having pain? No  PRECAUTIONS: None  RED FLAGS: None   WEIGHT BEARING RESTRICTIONS: No  FALLS: Has patient fallen in last 6 months? No  LIVING ENVIRONMENT: Lives with: lives with their family Lives in: House/apartment Stairs:  no stairs to enter, lives on second floor Has following equipment at home: Single point cane and shower chair  PLOF: Independent  PATIENT GOALS: continue walking as long as she can.   OBJECTIVE:  Note: Objective measures were completed at Evaluation unless otherwise noted.  DIAGNOSTIC FINDINGS: n/a  COGNITION: Overall cognitive status: Within functional limits for tasks assessed   SENSATION: WFL; slight decrease to RLE   COORDINATION: Heel slide: limited LLE    MUSCLE TONE: LLE: Mild and Moderate   POSTURE: No Significant postural limitations  LOWER EXTREMITY ROM:     WFL  LOWER EXTREMITY MMT:    MMT Right Eval Left Eval  Hip flexion 6.4 4.2  Hip extension    Hip abduction 7.9 3.4  Hip adduction 7.5 8.5  Hip internal rotation    Hip external rotation    Knee flexion 11.1 10.2  Knee extension 7.8 10  Ankle dorsiflexion 10.8 6.8  Ankle plantarflexion 7.2 6.9  Ankle inversion    Ankle eversion    (Blank rows = not tested)  BED MOBILITY:  Scooting into bed is very challenging at end of night  TRANSFERS: Assistive device utilized: Single point cane  Sit to stand: Complete Independence Stand to sit: Complete  Independence Chair to chair: Complete Independence    STAIRS: Level of Assistance: CGA Stair Negotiation Technique: Alternating Pattern  with Single Rail on Right Number of Stairs: 5  Height of Stairs: 6 inch    GAIT: Gait pattern: step through pattern and poor foot clearance- Left Distance walked: 60 ft Assistive device utilized: None Level of assistance: CGA Comments: L foot scuffing  FUNCTIONAL TESTS:  5 times sit to stand: 13 seconds with arms crossed.  6 minute walk test: next session  Functional gait assessment: 14/30  PATIENT SURVEYS:  ABC scale 60.5%                                                                                                                               TREATMENT DATE: 07/09/23   Physical therapy treatment session today consisted of completing assessment of goals and administration of testing as demonstrated and documented in flow sheet, treatment, and goals section of this note. Addition treatments may be found below.   6 Min Walk Test:  Instructed patient to ambulate as quickly and as safely as possible for 6 minutes using LRAD. Patient was allowed to take standing rest breaks without stopping the test, but if the patient required a sitting rest break the clock would be stopped and the test would be  over.  Results: 890 feet  using a SPC with CGA. Results indicate that the patient has reduced endurance with ambulation compared to age matched norms.  Age Matched Norms: 55-69 yo M: 105 F: 6, 44-79 yo M: 64 F: 471, 43-89 yo M: 417 F: 392 MDC: 58.21 meters (190.98 feet) or 50 meters (ANPTA Core Set of Outcome Measures for Adults with Neurologic Conditions, 2018) m   [facility] PT Assessment - 07/09/23 0001       Functional Gait  Assessment   Gait assessed  Yes    Gait Level Surface Walks 20 ft in less than 7 sec but greater than 5.5 sec, uses assistive device, slower speed, mild gait deviations, or deviates 6-10 in outside of the 12 in walkway width.     Change in Gait Speed Able to change speed, demonstrates mild gait deviations, deviates 6-10 in outside of the 12 in walkway width, or no gait deviations, unable to achieve a major change in velocity, or uses a change in velocity, or uses an assistive device.    Gait with Horizontal Head Turns Performs head turns smoothly with slight change in gait velocity (eg, minor disruption to smooth gait path), deviates 6-10 in outside 12 in walkway width, or uses an assistive device.    Gait with Vertical Head Turns Performs task with slight change in gait velocity (eg, minor disruption to smooth gait path), deviates 6 - 10 in outside 12 in walkway width or uses assistive device    Gait and Pivot Turn Turns slowly, requires verbal cueing, or requires several small steps to catch balance following turn and stop    Step Over Obstacle Is able to step over one shoe box (4.5 in total height) but must slow down and adjust steps to clear box safely. May require verbal cueing.    Gait with Narrow Base of Support Ambulates 4-7 steps.    Gait with Eyes Closed Walks 20 ft, uses assistive device, slower speed, mild gait deviations, deviates 6-10 in outside 12 in walkway width. Ambulates 20 ft in less than 9 sec but greater than 7 sec.    Ambulating Backwards Walks 20 ft, uses assistive device, slower speed, mild gait deviations, deviates 6-10 in outside 12 in walkway width.    Steps Alternating feet, must use rail.    Total Score 17            Pt performed 5 time sit<>stand (5xSTS): 10.7 sec (>15 sec indicates increased fall risk)      Neuro Re-ed: Standing with CGA next to support surface:  Airex pad: static stand 30 seconds x 2 trials, noticeable trembling of ankles/LE's with fatigue and challenge to maintain stability Airex pad: horizontal head turns 30 seconds scanning room 10x ; cueing for arc of motion  Airex pad: vertical head turns 30 seconds, cueing for arc of motion, noticeable sway with upward gaze  increasing demand on ankle righting reaction musculature Airex pad: one foot on 6" step one foot on airex pad, hold position for 30 seconds, switch legs, 2x each LE;   PATIENT EDUCATION: Education details: goals, POC Person educated: Patient Education method: Explanation, Demonstration, Tactile cues, and Verbal cues Education comprehension: verbalized understanding, returned demonstration, verbal cues required, and tactile cues required   HOME EXERCISE PROGRAM: Access Code: [email or web address] URL: [email or web address] Date: 05/29/2023 Prepared by: Cylinda Santoli  Exercises - Towel Scrunches  - 1 x daily - 7 x weekly - 2 sets - 10 reps - 5 hold - Toe  Spreading  - 1 x daily - 7 x weekly - 2 sets - 10 reps - 5 hold - Seated Ankle Alphabet  - 1 x daily - 7 x weekly - 2 sets - 1 reps - 5 hold  GOALS: Goals reviewed with patient? Yes  SHORT TERM GOALS: Target date: 05/22/2023  Patient will be independent in home exercise program to improve strength/mobility for better functional independence with ADLs. Baseline: 4/14: compliant  Goal status: MET     LONG TERM GOALS: Target date: 07/17/2023  Patient will increase Functional Gait Assessment score to >25/30 as to reduce fall risk and improve dynamic gait safety with community ambulation. Baseline: 1/28: 14/30  4/14: 17/30  Goal status: Partially Met  2.   Patient (< 76 years old) will complete five times sit to stand test in < 10 seconds indicating an increased LE strength and improved balance. Baseline: 1/28: 13 seconds 4/14: 10.72 seconds no hands  Goal status: partially met   3.  Patient will increase six minute walk test distance to >1000 for progression to community ambulator and improve gait ability Baseline: Perform next session 3/4: 820 ft 4/14: 890 ft with SPC  Goal status: Partially Met   4.  Patient will return to nightly ambulation's with less than five foot scuffs per ambulation to return to PLOF.  Baseline: 1/28:  limited ambulation 4/14: some limitations pending on day. Encouraged to perform multiple short walks  Goal status: Partially Met   5. Patient will increase ABC scale score >80% to demonstrate better functional mobility and better confidence with ADLs.  Baseline: 1/28: 60%  4/14: 65%  Goal status: INITIAL   ASSESSMENT:  CLINICAL IMPRESSION:    Patient is showing excellent progress towards functional goals at this time. Patient has improved gait stability and capacity for functional mobility. Patient's condition has the potential to improve in response to therapy. Maximum improvement is yet to be obtained. The anticipated improvement is attainable and reasonable in a generally predictable time.    Pt will continue to benefit from skilled therapy to address remaining deficits in order to improve overall QoL and return to PLOF.      OBJECTIVE IMPAIRMENTS: Abnormal gait, decreased activity tolerance, decreased balance, decreased coordination, decreased endurance, decreased mobility, difficulty walking, decreased strength, impaired perceived functional ability, impaired flexibility, impaired UE functional use, and improper body mechanics.   ACTIVITY LIMITATIONS: carrying, lifting, bending, sitting, standing, squatting, stairs, transfers, bed mobility, dressing, reach over head, hygiene/grooming, locomotion level, and caring for others  PARTICIPATION LIMITATIONS: meal prep, cleaning, laundry, interpersonal relationship, driving, shopping, community activity, and occupation  PERSONAL FACTORS: Age, Fitness, Past/current experiences, Profession, Time since onset of injury/illness/exacerbation, and 1-2 comorbidities: MS anxiety  are also affecting patient's functional outcome.   REHAB POTENTIAL: Good  CLINICAL DECISION MAKING: Evolving/moderate complexity  EVALUATION COMPLEXITY: Moderate  PLAN:  PT FREQUENCY: 2x/week  PT DURATION: 12 weeks  PLANNED INTERVENTIONS: 97164- PT Re-evaluation,  97110-Therapeutic exercises, 97530- Therapeutic activity, 97112- Neuromuscular re-education, 97535- Self Care, 16109- Manual therapy, [phone]- Gait training, [phone]- Orthotic Fit/training, [phone]- Canalith repositioning, Patient/Family education, Balance training, Stair training, Taping, Dry Needling, Joint mobilization, Joint manipulation, Spinal mobilization, Vestibular training, Visual/preceptual remediation/compensation, DME instructions, Cryotherapy, and Moist heat  PLAN FOR NEXT SESSION:  airex pad/balance, LLE strengthening, HEP , LLE coordination. Adding cognitive/manual dual task with dynamic balance activities   Baylon Santelli  Brain Cahill, PT, DPT Physical Therapist - [facility]  Outpatient Physical Therapy- Main Campus [phone]    07/09/23,  2:00 PM

## 2023-07-09 ENCOUNTER — Ambulatory Visit: Payer: No Typology Code available for payment source

## 2023-07-09 DIAGNOSIS — R262 Difficulty in walking, not elsewhere classified: Secondary | ICD-10-CM | POA: Diagnosis not present

## 2023-07-09 DIAGNOSIS — M6281 Muscle weakness (generalized): Secondary | ICD-10-CM

## 2023-07-09 DIAGNOSIS — R269 Unspecified abnormalities of gait and mobility: Secondary | ICD-10-CM

## 2023-07-09 DIAGNOSIS — R2681 Unsteadiness on feet: Secondary | ICD-10-CM

## 2023-07-10 NOTE — Therapy (Signed)
 OUTPATIENT PHYSICAL THERAPY NEURO TREATMENT     Patient Name: Michelle Ruiz MRN: [id number] DOB:01-Jun-1970, 53 y.o., female Today's Date: 07/11/2023   PCP: Rex Castor  REFERRING PROVIDER: Devora Folks K  END OF SESSION:  PT End of Session - 07/11/23 1448     Visit Number 11    Number of Visits 24    Date for PT Re-Evaluation 07/17/23    PT Start Time 1445    PT Stop Time 1529    PT Time Calculation (min) 44 min    Equipment Utilized During Treatment Gait belt    Activity Tolerance Patient tolerated treatment well    Behavior During Therapy WFL for tasks assessed/performed                  Past Medical History:  Diagnosis Date   Abnormal Pap smear of cervix    Actinic keratosis    Anxiety    Basal cell carcinoma 04/14/2008   Right upper ant. thigh. BCC with sclerosis.   Basal cell carcinoma 08/14/2008   Right lat. lower thigh. Superficial.    Basal cell carcinoma 04/29/2019   Right mid forearm. Superficial and nodular patterns. EDC.   Basal cell carcinoma 08/17/2020   Right upper antecubital. EDC.   Basal cell carcinoma 08/23/2021   Left Lower Leg, EDC   Kidney stones    MS (multiple sclerosis) (HCC)    UTI (urinary tract infection)    Past Surgical History:  Procedure Laterality Date   BREAST BIOPSY Right 2010   stereotactic biopsy/ neg/ dr.byrnett   BREAST BIOPSY Left 2012   neg/dr. brynett   CESAREAN SECTION  2008   RPH   TONSILLECTOMY  2006   There are no active problems to display for this patient.   ONSET DATE: diagnosed 12 years ago.   REFERRING DIAG: MS, gait and mobility   THERAPY DIAG:  Difficulty in walking, not elsewhere classified  Abnormality of gait and mobility  Unsteadiness on feet  Muscle weakness (generalized)  Rationale for Evaluation and Treatment: Rehabilitation  SUBJECTIVE:                                                                                                                                                                                              SUBJECTIVE STATEMENT:  Patient reports she is feeling good. No pain today   Pt accompanied by: self  PERTINENT HISTORY: Patient was seen by physical therapy until September of last year. Pmh includes MS, UTI, anxiety. Has extreme spasticity in LLE. Extreme cold and heat affect her LLE. Patient is now taking hormonal medication. Patient is  a Chartered certified accountant.   PAIN:  Are you having pain? No  PRECAUTIONS: None  RED FLAGS: None   WEIGHT BEARING RESTRICTIONS: No  FALLS: Has patient fallen in last 6 months? No  LIVING ENVIRONMENT: Lives with: lives with their family Lives in: House/apartment Stairs:  no stairs to enter, lives on second floor Has following equipment at home: Single point cane and shower chair  PLOF: Independent  PATIENT GOALS: continue walking as long as she can.   OBJECTIVE:  Note: Objective measures were completed at Evaluation unless otherwise noted.  DIAGNOSTIC FINDINGS: n/a  COGNITION: Overall cognitive status: Within functional limits for tasks assessed   SENSATION: WFL; slight decrease to RLE   COORDINATION: Heel slide: limited LLE    MUSCLE TONE: LLE: Mild and Moderate   POSTURE: No Significant postural limitations  LOWER EXTREMITY ROM:     WFL  LOWER EXTREMITY MMT:    MMT Right Eval Left Eval  Hip flexion 6.4 4.2  Hip extension    Hip abduction 7.9 3.4  Hip adduction 7.5 8.5  Hip internal rotation    Hip external rotation    Knee flexion 11.1 10.2  Knee extension 7.8 10  Ankle dorsiflexion 10.8 6.8  Ankle plantarflexion 7.2 6.9  Ankle inversion    Ankle eversion    (Blank rows = not tested)  BED MOBILITY:  Scooting into bed is very challenging at end of night  TRANSFERS: Assistive device utilized: Single point cane  Sit to stand: Complete Independence Stand to sit: Complete Independence Chair to chair: Complete Independence    STAIRS: Level of Assistance:  CGA Stair Negotiation Technique: Alternating Pattern  with Single Rail on Right Number of Stairs: 5  Height of Stairs: 6 inch    GAIT: Gait pattern: step through pattern and poor foot clearance- Left Distance walked: 60 ft Assistive device utilized: None Level of assistance: CGA Comments: L foot scuffing  FUNCTIONAL TESTS:  5 times sit to stand: 13 seconds with arms crossed.  6 minute walk test: next session  Functional gait assessment: 14/30  PATIENT SURVEYS:  ABC scale 60.5%                                                                                                                               TREATMENT DATE: 07/11/23   TherAct Bosu ball round side up:  -crane step up with UE support 10x each LE -lateral crane step up 10x each LE; BUE support    [place]ore balance with focus on weight shift x 6 minutes  RTB DF 20x   Neuro Re-ed: Standing with CGA next to support surface:  Airex pad: static stand 30 seconds x 2 trials, noticeable trembling of ankles/LE's with fatigue and challenge to maintain stability Airex pad: horizontal head turns 30 seconds scanning room 10x ; cueing for arc of motion  Airex pad: vertical head turns 30 seconds, cueing for arc of motion, noticeable sway with upward gaze  increasing demand on ankle righting reaction musculature Airex pad: one foot on 6" step one foot on airex pad, hold position for 30 seconds, switch legs, 2x each LE; Airex pad dual task word game x multiple minutes    PATIENT EDUCATION: Education details: goals, POC Person educated: Patient Education method: Explanation, Demonstration, Tactile cues, and Verbal cues Education comprehension: verbalized understanding, returned demonstration, verbal cues required, and tactile cues required   HOME EXERCISE PROGRAM: Access Code: [email or web address] URL: [email or web address] Date: 05/29/2023 Prepared by: Jeanette Moffatt  Exercises - Towel Scrunches  - 1 x daily - 7 x weekly - 2  sets - 10 reps - 5 hold - Toe Spreading  - 1 x daily - 7 x weekly - 2 sets - 10 reps - 5 hold - Seated Ankle Alphabet  - 1 x daily - 7 x weekly - 2 sets - 1 reps - 5 hold  GOALS: Goals reviewed with patient? Yes  SHORT TERM GOALS: Target date: 05/22/2023  Patient will be independent in home exercise program to improve strength/mobility for better functional independence with ADLs. Baseline: 4/14: compliant  Goal status: MET     LONG TERM GOALS: Target date: 07/17/2023  Patient will increase Functional Gait Assessment score to >25/30 as to reduce fall risk and improve dynamic gait safety with community ambulation. Baseline: 1/28: 14/30  4/14: 17/30  Goal status: Partially Met  2.   Patient (< 110 years old) will complete five times sit to stand test in < 10 seconds indicating an increased LE strength and improved balance. Baseline: 1/28: 13 seconds 4/14: 10.72 seconds no hands  Goal status: partially met   3.  Patient will increase six minute walk test distance to >1000 for progression to community ambulator and improve gait ability Baseline: Perform next session 3/4: 820 ft 4/14: 890 ft with SPC  Goal status: Partially Met   4.  Patient will return to nightly ambulation's with less than five foot scuffs per ambulation to return to PLOF.  Baseline: 1/28: limited ambulation 4/14: some limitations pending on day. Encouraged to perform multiple short walks  Goal status: Partially Met   5. Patient will increase ABC scale score >80% to demonstrate better functional mobility and better confidence with ADLs.  Baseline: 1/28: 60%  4/14: 65%  Goal status: INITIAL   ASSESSMENT:  CLINICAL IMPRESSION:    Patient is highly motivated throughout session. She is able to stabilize on unstable surfaces with progressive difficulty. She requires UE support for BOSU ball at this time due to the level of challenge. Reaching while performing dual task word game on airex pad is challenging but able to  be performed.  Pt will continue to benefit from skilled therapy to address remaining deficits in order to improve overall QoL and return to PLOF.      OBJECTIVE IMPAIRMENTS: Abnormal gait, decreased activity tolerance, decreased balance, decreased coordination, decreased endurance, decreased mobility, difficulty walking, decreased strength, impaired perceived functional ability, impaired flexibility, impaired UE functional use, and improper body mechanics.   ACTIVITY LIMITATIONS: carrying, lifting, bending, sitting, standing, squatting, stairs, transfers, bed mobility, dressing, reach over head, hygiene/grooming, locomotion level, and caring for others  PARTICIPATION LIMITATIONS: meal prep, cleaning, laundry, interpersonal relationship, driving, shopping, community activity, and occupation  PERSONAL FACTORS: Age, Fitness, Past/current experiences, Profession, Time since onset of injury/illness/exacerbation, and 1-2 comorbidities: MS anxiety  are also affecting patient's functional outcome.   REHAB POTENTIAL: Good  CLINICAL DECISION MAKING: Evolving/moderate complexity  EVALUATION COMPLEXITY: Moderate  PLAN:  PT FREQUENCY: 2x/week  PT DURATION: 12 weeks  PLANNED INTERVENTIONS: 97164- PT Re-evaluation, 97110-Therapeutic exercises, 97530- Therapeutic activity, 97112- Neuromuscular re-education, 97535- Self Care, 52841- Manual therapy, [phone]- Gait training, [phone]- Orthotic Fit/training, [phone]- Canalith repositioning, Patient/Family education, Balance training, Stair training, Taping, Dry Needling, Joint mobilization, Joint manipulation, Spinal mobilization, Vestibular training, Visual/preceptual remediation/compensation, DME instructions, Cryotherapy, and Moist heat  PLAN FOR NEXT SESSION:  airex pad/balance, LLE strengthening, HEP , LLE coordination. Adding cognitive/manual dual task with dynamic balance activities   Kaydyn Chism  Brain Cahill, PT, DPT Physical Therapist - [facility]  Outpatient Physical Therapy- Main Campus [phone]    07/11/23, 3:30 PM

## 2023-07-11 ENCOUNTER — Ambulatory Visit: Payer: No Typology Code available for payment source

## 2023-07-11 DIAGNOSIS — R269 Unspecified abnormalities of gait and mobility: Secondary | ICD-10-CM

## 2023-07-11 DIAGNOSIS — R262 Difficulty in walking, not elsewhere classified: Secondary | ICD-10-CM | POA: Diagnosis not present

## 2023-07-11 DIAGNOSIS — M6281 Muscle weakness (generalized): Secondary | ICD-10-CM

## 2023-07-11 DIAGNOSIS — R2681 Unsteadiness on feet: Secondary | ICD-10-CM

## 2023-07-14 NOTE — Therapy (Signed)
 OUTPATIENT PHYSICAL THERAPY NEURO TREATMENT/ RECERT     Patient Name: Michelle Ruiz MRN: [id number] DOB:[birth year], 53 y.o., female Today's Date: 07/16/2023   PCP: Rex Castor  REFERRING PROVIDER: Devora Folks K  END OF SESSION:  PT End of Session - 07/16/23 1308     Visit Number 12    Number of Visits 36    Date for PT Re-Evaluation 10/08/23    PT Start Time 1310    PT Stop Time 1355    PT Time Calculation (min) 45 min    Equipment Utilized During Treatment Gait belt    Activity Tolerance Patient tolerated treatment well    Behavior During Therapy WFL for tasks assessed/performed                   Past Medical History:  Diagnosis Date   Abnormal Pap smear of cervix    Actinic keratosis    Anxiety    Basal cell carcinoma 04/14/2008   Right upper ant. thigh. BCC with sclerosis.   Basal cell carcinoma 08/14/2008   Right lat. lower thigh. Superficial.    Basal cell carcinoma 04/29/2019   Right mid forearm. Superficial and nodular patterns. EDC.   Basal cell carcinoma 08/17/2020   Right upper antecubital. EDC.   Basal cell carcinoma 08/23/2021   Left Lower Leg, EDC   Kidney stones    MS (multiple sclerosis) (HCC)    UTI (urinary tract infection)    Past Surgical History:  Procedure Laterality Date   BREAST BIOPSY Right 2010   stereotactic biopsy/ neg/ dr.byrnett   BREAST BIOPSY Left 2012   neg/dr. brynett   CESAREAN SECTION  2008   RPH   TONSILLECTOMY  2006   There are no active problems to display for this patient.   ONSET DATE: diagnosed 12 years ago.   REFERRING DIAG: MS, gait and mobility   THERAPY DIAG:  Difficulty in walking, not elsewhere classified - Plan: PT plan of care cert/re-cert  Abnormality of gait and mobility - Plan: PT plan of care cert/re-cert  Unsteadiness on feet - Plan: PT plan of care cert/re-cert  Muscle weakness (generalized) - Plan: PT plan of care cert/re-cert  Rationale for Evaluation and Treatment:  Rehabilitation  SUBJECTIVE:                                                                                                                                                                                             SUBJECTIVE STATEMENT:  Patient reports therapy has been helping. Still having difficulty with consistently walking well.   Pt accompanied by: self  PERTINENT HISTORY: Patient was  seen by physical therapy until September of last year. Pmh includes MS, UTI, anxiety. Has extreme spasticity in LLE. Extreme cold and heat affect her LLE. Patient is now taking hormonal medication. Patient is a Chartered certified accountant.   PAIN:  Are you having pain? No  PRECAUTIONS: None  RED FLAGS: None   WEIGHT BEARING RESTRICTIONS: No  FALLS: Has patient fallen in last 6 months? No  LIVING ENVIRONMENT: Lives with: lives with their family Lives in: House/apartment Stairs:  no stairs to enter, lives on second floor Has following equipment at home: Single point cane and shower chair  PLOF: Independent  PATIENT GOALS: continue walking as long as she can.   OBJECTIVE:  Note: Objective measures were completed at Evaluation unless otherwise noted.  DIAGNOSTIC FINDINGS: n/a  COGNITION: Overall cognitive status: Within functional limits for tasks assessed   SENSATION: WFL; slight decrease to RLE   COORDINATION: Heel slide: limited LLE    MUSCLE TONE: LLE: Mild and Moderate   POSTURE: No Significant postural limitations  LOWER EXTREMITY ROM:     WFL  LOWER EXTREMITY MMT:    MMT Right Eval Left Eval  Hip flexion 6.4 4.2  Hip extension    Hip abduction 7.9 3.4  Hip adduction 7.5 8.5  Hip internal rotation    Hip external rotation    Knee flexion 11.1 10.2  Knee extension 7.8 10  Ankle dorsiflexion 10.8 6.8  Ankle plantarflexion 7.2 6.9  Ankle inversion    Ankle eversion    (Blank rows = not tested)  BED MOBILITY:  Scooting into bed is very challenging at end of  night  TRANSFERS: Assistive device utilized: Single point cane  Sit to stand: Complete Independence Stand to sit: Complete Independence Chair to chair: Complete Independence    STAIRS: Level of Assistance: CGA Stair Negotiation Technique: Alternating Pattern  with Single Rail on Right Number of Stairs: 5  Height of Stairs: 6 inch    GAIT: Gait pattern: step through pattern and poor foot clearance- Left Distance walked: 60 ft Assistive device utilized: None Level of assistance: CGA Comments: L foot scuffing  FUNCTIONAL TESTS:  5 times sit to stand: 13 seconds with arms crossed.  6 minute walk test: next session  Functional gait assessment: 14/30  PATIENT SURVEYS:  ABC scale 60.5%                                                                                                                               TREATMENT DATE: 07/16/23   TherAct Half foam roller df/pf 20x standing with UE support Static stance 60 seconds  4 way ankle 10x each LE with RTB   Activity Description: 6 pods in front/behind and on green step on purple pad Activity Setting:  The Blaze Pod Random setting was chosen to enhance cognitive processing and agility, providing an unpredictable environment to simulate real-world scenarios, and fostering quick reactions and adaptability.   Number of Pods:  6 Cycles/Sets:  3 Duration (Time or Hit Count):  10  Activity Description: 3 on step 3 on floor ; red-right green -left Activity Setting:  The Blaze Pod Random setting was chosen to enhance cognitive processing and agility, providing an unpredictable environment to simulate real-world scenarios, and fostering quick reactions and adaptability.   Number of Pods:  6 Cycles/Sets:  3 Duration (Time or Hit Count):  10    Neuro Re-ed: Standing with CGA next to support surface:  Airex pad: static stand 30 seconds x 2 trials, noticeable trembling of ankles/LE's with fatigue and challenge to maintain  stability Airex pad: horizontal head turns 30 seconds scanning room 10x ; cueing for arc of motion  Airex pad: vertical head turns 30 seconds, cueing for arc of motion, noticeable sway with upward gaze increasing demand on ankle righting reaction musculature Airex pad: march 12x each LE  Airex pad dual task word game x multiple minutes    PATIENT EDUCATION: Education details: goals, POC Person educated: Patient Education method: Explanation, Demonstration, Tactile cues, and Verbal cues Education comprehension: verbalized understanding, returned demonstration, verbal cues required, and tactile cues required   HOME EXERCISE PROGRAM: Access Code: [email or web address] URL: [email or web address] Date: 05/29/2023 Prepared by: Keola Heninger  Exercises - Towel Scrunches  - 1 x daily - 7 x weekly - 2 sets - 10 reps - 5 hold - Toe Spreading  - 1 x daily - 7 x weekly - 2 sets - 10 reps - 5 hold - Seated Ankle Alphabet  - 1 x daily - 7 x weekly - 2 sets - 1 reps - 5 hold  GOALS: Goals reviewed with patient? Yes  SHORT TERM GOALS: Target date: 05/22/2023  Patient will be independent in home exercise program to improve strength/mobility for better functional independence with ADLs. Baseline: 4/14: compliant  Goal status: MET     LONG TERM GOALS: Target date: 10/08/2023    Patient will increase Functional Gait Assessment score to >25/30 as to reduce fall risk and improve dynamic gait safety with community ambulation. Baseline: 1/28: 14/30  4/14: 17/30  Goal status: Partially Met  2.   Patient (< 5 years old) will complete five times sit to stand test in < 10 seconds indicating an increased LE strength and improved balance. Baseline: 1/28: 13 seconds 4/14: 10.72 seconds no hands  Goal status: partially met   3.  Patient will increase six minute walk test distance to >1000 for progression to community ambulator and improve gait ability Baseline: Perform next session 3/4: 820 ft 4/14:  890 ft with SPC  Goal status: Partially Met   4.  Patient will return to nightly ambulation's with less than five foot scuffs per ambulation to return to PLOF.  Baseline: 1/28: limited ambulation 4/14: some limitations pending on day. Encouraged to perform multiple short walks  Goal status: Partially Met   5. Patient will increase ABC scale score >80% to demonstrate better functional mobility and better confidence with ADLs.  Baseline: 1/28: 60%  4/14: 65%  Goal status: Partially Met   ASSESSMENT:  CLINICAL IMPRESSION:    Goals performed 07/09/23, please refer to this note for further details regarding progression and plan of care.  Patient tolerates progressive mobility and stability drills this session. The patient demonstrated significant progress while utilizing [employer], showcasing improved coordination, balance, and cognitive function. The incorporation of dual-tasking technology with color recognition and association with specific movements in Blaze Pods was strategically chosen to provide a dynamic training environment,  enabling the patient to engage in simultaneous physical and cognitive tasks. This unique approach enhances not only their physical abilities but also fosters increased neural connectivity and mental awareness, contributing to a well-rounded and effective rehabilitation and training experience.  Pt will continue to benefit from skilled therapy to address remaining deficits in order to improve overall QoL and return to PLOF.      OBJECTIVE IMPAIRMENTS: Abnormal gait, decreased activity tolerance, decreased balance, decreased coordination, decreased endurance, decreased mobility, difficulty walking, decreased strength, impaired perceived functional ability, impaired flexibility, impaired UE functional use, and improper body mechanics.   ACTIVITY LIMITATIONS: carrying, lifting, bending, sitting, standing, squatting, stairs, transfers, bed mobility, dressing, reach over  head, hygiene/grooming, locomotion level, and caring for others  PARTICIPATION LIMITATIONS: meal prep, cleaning, laundry, interpersonal relationship, driving, shopping, community activity, and occupation  PERSONAL FACTORS: Age, Fitness, Past/current experiences, Profession, Time since onset of injury/illness/exacerbation, and 1-2 comorbidities: MS anxiety  are also affecting patient's functional outcome.   REHAB POTENTIAL: Good  CLINICAL DECISION MAKING: Evolving/moderate complexity  EVALUATION COMPLEXITY: Moderate  PLAN:  PT FREQUENCY: 2x/week  PT DURATION: 12 weeks  PLANNED INTERVENTIONS: 97164- PT Re-evaluation, 97110-Therapeutic exercises, 97530- Therapeutic activity, 97112- Neuromuscular re-education, 97535- Self Care, 40981- Manual therapy, [phone]- Gait training, [phone]- Orthotic Fit/training, [phone]- Canalith repositioning, Patient/Family education, Balance training, Stair training, Taping, Dry Needling, Joint mobilization, Joint manipulation, Spinal mobilization, Vestibular training, Visual/preceptual remediation/compensation, DME instructions, Cryotherapy, and Moist heat  PLAN FOR NEXT SESSION:  airex pad/balance, LLE strengthening, HEP , LLE coordination. Adding cognitive/manual dual task with dynamic balance activities   Delsa Walder  Brain Cahill, PT, DPT Physical Therapist - [facility]  Outpatient Physical Therapy- Main Campus [phone]    07/16/23, 1:55 PM

## 2023-07-16 ENCOUNTER — Ambulatory Visit: Payer: No Typology Code available for payment source

## 2023-07-16 DIAGNOSIS — R2681 Unsteadiness on feet: Secondary | ICD-10-CM

## 2023-07-16 DIAGNOSIS — R262 Difficulty in walking, not elsewhere classified: Secondary | ICD-10-CM | POA: Diagnosis not present

## 2023-07-16 DIAGNOSIS — M6281 Muscle weakness (generalized): Secondary | ICD-10-CM

## 2023-07-16 DIAGNOSIS — R269 Unspecified abnormalities of gait and mobility: Secondary | ICD-10-CM

## 2023-07-17 NOTE — Therapy (Incomplete)
 OUTPATIENT PHYSICAL THERAPY NEURO TREATMENT     Patient Name: Michelle Ruiz MRN: [id number] DOB:[birth year], 53 y.o., female Today's Date: 07/18/2023   PCP: Rex Castor  REFERRING PROVIDER: Devora Folks K  END OF SESSION:  PT End of Session - 07/18/23 1309     Visit Number 13    Number of Visits 36    Date for PT Re-Evaluation 10/08/23    PT Start Time 1314    PT Stop Time 1359    PT Time Calculation (min) 45 min    Equipment Utilized During Treatment Gait belt    Activity Tolerance Patient tolerated treatment well    Behavior During Therapy WFL for tasks assessed/performed                    Past Medical History:  Diagnosis Date   Abnormal Pap smear of cervix    Actinic keratosis    Anxiety    Basal cell carcinoma 04/14/2008   Right upper ant. thigh. BCC with sclerosis.   Basal cell carcinoma 08/14/2008   Right lat. lower thigh. Superficial.    Basal cell carcinoma 04/29/2019   Right mid forearm. Superficial and nodular patterns. EDC.   Basal cell carcinoma 08/17/2020   Right upper antecubital. EDC.   Basal cell carcinoma 08/23/2021   Left Lower Leg, EDC   Kidney stones    MS (multiple sclerosis) (HCC)    UTI (urinary tract infection)    Past Surgical History:  Procedure Laterality Date   BREAST BIOPSY Right 2010   stereotactic biopsy/ neg/ dr.byrnett   BREAST BIOPSY Left 2012   neg/dr. brynett   CESAREAN SECTION  2008   RPH   TONSILLECTOMY  2006   There are no active problems to display for this patient.   ONSET DATE: diagnosed 12 years ago.   REFERRING DIAG: MS, gait and mobility   THERAPY DIAG:  Difficulty in walking, not elsewhere classified  Abnormality of gait and mobility  Unsteadiness on feet  Muscle weakness (generalized)  Rationale for Evaluation and Treatment: Rehabilitation  SUBJECTIVE:                                                                                                                                                                                              SUBJECTIVE STATEMENT:  Patient reports no fatigue level, coming from work.    Pt accompanied by: self  PERTINENT HISTORY: Patient was seen by physical therapy until September of last year. Pmh includes MS, UTI, anxiety. Has extreme spasticity in LLE. Extreme cold and heat affect her LLE. Patient is now taking hormonal medication.  Patient is a Chartered certified accountant.   PAIN:  Are you having pain? No  PRECAUTIONS: None  RED FLAGS: None   WEIGHT BEARING RESTRICTIONS: No  FALLS: Has patient fallen in last 6 months? No  LIVING ENVIRONMENT: Lives with: lives with their family Lives in: House/apartment Stairs:  no stairs to enter, lives on second floor Has following equipment at home: Single point cane and shower chair  PLOF: Independent  PATIENT GOALS: continue walking as long as she can.   OBJECTIVE:  Note: Objective measures were completed at Evaluation unless otherwise noted.  DIAGNOSTIC FINDINGS: n/a  COGNITION: Overall cognitive status: Within functional limits for tasks assessed   SENSATION: WFL; slight decrease to RLE   COORDINATION: Heel slide: limited LLE    MUSCLE TONE: LLE: Mild and Moderate   POSTURE: No Significant postural limitations  LOWER EXTREMITY ROM:     WFL  LOWER EXTREMITY MMT:    MMT Right Eval Left Eval  Hip flexion 6.4 4.2  Hip extension    Hip abduction 7.9 3.4  Hip adduction 7.5 8.5  Hip internal rotation    Hip external rotation    Knee flexion 11.1 10.2  Knee extension 7.8 10  Ankle dorsiflexion 10.8 6.8  Ankle plantarflexion 7.2 6.9  Ankle inversion    Ankle eversion    (Blank rows = not tested)  BED MOBILITY:  Scooting into bed is very challenging at end of night  TRANSFERS: Assistive device utilized: Single point cane  Sit to stand: Complete Independence Stand to sit: Complete Independence Chair to chair: Complete Independence    STAIRS: Level of  Assistance: CGA Stair Negotiation Technique: Alternating Pattern  with Single Rail on Right Number of Stairs: 5  Height of Stairs: 6 inch    GAIT: Gait pattern: step through pattern and poor foot clearance- Left Distance walked: 60 ft Assistive device utilized: None Level of assistance: CGA Comments: L foot scuffing  FUNCTIONAL TESTS:  5 times sit to stand: 13 seconds with arms crossed.  6 minute walk test: next session  Functional gait assessment: 14/30  PATIENT SURVEYS:  ABC scale 60.5%                                                                                                                               TREATMENT DATE: 07/18/23   TherAct 4 way ankle 10x each LE with RTB   Activity Description: 6 pods in front and on green step and on wall on purple pad Activity Setting:  The Blaze Pod Random setting was chosen to enhance cognitive processing and agility, providing an unpredictable environment to simulate real-world scenarios, and fostering quick reactions and adaptability.   Number of Pods:  6 Cycles/Sets:  3 Duration (Time or Hit Count):  10  Green step with purple raiser: lateral step up/down lateral 10x each side  Incline:  -static stand 30 seconds   Seated on dynadisc: -static stand 60 seconds x 2 trials  -  lateral weight shift 10x each side -forward/backward 10x      PATIENT EDUCATION: Education details: goals, POC Person educated: Patient Education method: Explanation, Demonstration, Tactile cues, and Verbal cues Education comprehension: verbalized understanding, returned demonstration, verbal cues required, and tactile cues required   HOME EXERCISE PROGRAM: Access Code: [email or web address] URL: [email or web address] Date: 05/29/2023 Prepared by: Mahmud Keithly  Exercises - Towel Scrunches  - 1 x daily - 7 x weekly - 2 sets - 10 reps - 5 hold - Toe Spreading  - 1 x daily - 7 x weekly - 2 sets - 10 reps - 5 hold - Seated Ankle Alphabet  - 1  x daily - 7 x weekly - 2 sets - 1 reps - 5 hold  GOALS: Goals reviewed with patient? Yes  SHORT TERM GOALS: Target date: 05/22/2023  Patient will be independent in home exercise program to improve strength/mobility for better functional independence with ADLs. Baseline: 4/14: compliant  Goal status: MET     LONG TERM GOALS: Target date: 10/08/2023    Patient will increase Functional Gait Assessment score to >25/30 as to reduce fall risk and improve dynamic gait safety with community ambulation. Baseline: 1/28: 14/30  4/14: 17/30  Goal status: Partially Met  2.   Patient (< 70 years old) will complete five times sit to stand test in < 10 seconds indicating an increased LE strength and improved balance. Baseline: 1/28: 13 seconds 4/14: 10.72 seconds no hands  Goal status: partially met   3.  Patient will increase six minute walk test distance to >1000 for progression to community ambulator and improve gait ability Baseline: Perform next session 3/4: 820 ft 4/14: 890 ft with SPC  Goal status: Partially Met   4.  Patient will return to nightly ambulation's with less than five foot scuffs per ambulation to return to PLOF.  Baseline: 1/28: limited ambulation 4/14: some limitations pending on day. Encouraged to perform multiple short walks  Goal status: Partially Met   5. Patient will increase ABC scale score >80% to demonstrate better functional mobility and better confidence with ADLs.  Baseline: 1/28: 60%  4/14: 65%  Goal status: Partially Met   ASSESSMENT:  CLINICAL IMPRESSION:    Patient presents with excellent motivation. She is extremely challenged with sitting on a dynadisc to challenge core stabilization indicating area of continued focus. Patient is improving with step up/downs with decreased need for UE support for stabilization this session.  Pt will continue to benefit from skilled therapy to address remaining deficits in order to improve overall QoL and return to PLOF.       OBJECTIVE IMPAIRMENTS: Abnormal gait, decreased activity tolerance, decreased balance, decreased coordination, decreased endurance, decreased mobility, difficulty walking, decreased strength, impaired perceived functional ability, impaired flexibility, impaired UE functional use, and improper body mechanics.   ACTIVITY LIMITATIONS: carrying, lifting, bending, sitting, standing, squatting, stairs, transfers, bed mobility, dressing, reach over head, hygiene/grooming, locomotion level, and caring for others  PARTICIPATION LIMITATIONS: meal prep, cleaning, laundry, interpersonal relationship, driving, shopping, community activity, and occupation  PERSONAL FACTORS: Age, Fitness, Past/current experiences, Profession, Time since onset of injury/illness/exacerbation, and 1-2 comorbidities: MS anxiety  are also affecting patient's functional outcome.   REHAB POTENTIAL: Good  CLINICAL DECISION MAKING: Evolving/moderate complexity  EVALUATION COMPLEXITY: Moderate  PLAN:  PT FREQUENCY: 2x/week  PT DURATION: 12 weeks  PLANNED INTERVENTIONS: 97164- PT Re-evaluation, 97110-Therapeutic exercises, 97530- Therapeutic activity, [id number]- Neuromuscular re-education, 97535- Self Care, 40102- Manual therapy, [id number]- Gait training, [phone]- Orthotic Fit/training, [phone]- Canalith  repositioning, Patient/Family education, Balance training, Stair training, Taping, Dry Needling, Joint mobilization, Joint manipulation, Spinal mobilization, Vestibular training, Visual/preceptual remediation/compensation, DME instructions, Cryotherapy, and Moist heat  PLAN FOR NEXT SESSION:  airex pad/balance, LLE strengthening, HEP , LLE coordination. Adding cognitive/manual dual task with dynamic balance activities   Harrington Jobe  Brain Cahill, PT, DPT Physical Therapist - [facility]  Outpatient Physical Therapy- Main [facility] [phone]    07/18/23, 1:59 PM

## 2023-07-18 ENCOUNTER — Ambulatory Visit: Payer: No Typology Code available for payment source

## 2023-07-18 DIAGNOSIS — R269 Unspecified abnormalities of gait and mobility: Secondary | ICD-10-CM

## 2023-07-18 DIAGNOSIS — R262 Difficulty in walking, not elsewhere classified: Secondary | ICD-10-CM | POA: Diagnosis not present

## 2023-07-18 DIAGNOSIS — R2681 Unsteadiness on feet: Secondary | ICD-10-CM

## 2023-07-18 DIAGNOSIS — M6281 Muscle weakness (generalized): Secondary | ICD-10-CM

## 2023-07-19 NOTE — Therapy (Signed)
 OUTPATIENT PHYSICAL THERAPY NEURO TREATMENT     Patient Name: Michelle Ruiz MRN: [id number] DOB:25-Nov-1970, 53 y.o., female Today's Date: 07/23/2023   PCP: Rex Castor  REFERRING PROVIDER: Devora Folks K  END OF SESSION:  PT End of Session - 07/23/23 1309     Visit Number 14    Number of Visits 36    Date for PT Re-Evaluation 10/08/23    PT Start Time 1314    PT Stop Time 1359    PT Time Calculation (min) 45 min    Equipment Utilized During Treatment Gait belt    Activity Tolerance Patient tolerated treatment well    Behavior During Therapy WFL for tasks assessed/performed                     Past Medical History:  Diagnosis Date   Abnormal Pap smear of cervix    Actinic keratosis    Anxiety    Basal cell carcinoma 04/14/2008   Right upper ant. thigh. BCC with sclerosis.   Basal cell carcinoma 08/14/2008   Right lat. lower thigh. Superficial.    Basal cell carcinoma 04/29/2019   Right mid forearm. Superficial and nodular patterns. EDC.   Basal cell carcinoma 08/17/2020   Right upper antecubital. EDC.   Basal cell carcinoma 08/23/2021   Left Lower Leg, EDC   Kidney stones    MS (multiple sclerosis) (HCC)    UTI (urinary tract infection)    Past Surgical History:  Procedure Laterality Date   BREAST BIOPSY Right 2010   stereotactic biopsy/ neg/ dr.byrnett   BREAST BIOPSY Left 2012   neg/dr. brynett   CESAREAN SECTION  2008   RPH   TONSILLECTOMY  2006   There are no active problems to display for this patient.   ONSET DATE: diagnosed 12 years ago.   REFERRING DIAG: MS, gait and mobility   THERAPY DIAG:  Difficulty in walking, not elsewhere classified  Abnormality of gait and mobility  Unsteadiness on feet  Rationale for Evaluation and Treatment: Rehabilitation  SUBJECTIVE:                                                                                                                                                                                              SUBJECTIVE STATEMENT:  Patient reports she is very fatigued today.   Pt accompanied by: self  PERTINENT HISTORY: Patient was seen by physical therapy until September of last year. Pmh includes MS, UTI, anxiety. Has extreme spasticity in LLE. Extreme cold and heat affect her LLE. Patient is now taking hormonal medication. Patient is a Chartered certified accountant.  PAIN:  Are you having pain? No  PRECAUTIONS: None  RED FLAGS: None   WEIGHT BEARING RESTRICTIONS: No  FALLS: Has patient fallen in last 6 months? No  LIVING ENVIRONMENT: Lives with: lives with their family Lives in: House/apartment Stairs:  no stairs to enter, lives on second floor Has following equipment at home: Single point cane and shower chair  PLOF: Independent  PATIENT GOALS: continue walking as long as she can.   OBJECTIVE:  Note: Objective measures were completed at Evaluation unless otherwise noted.  DIAGNOSTIC FINDINGS: n/a  COGNITION: Overall cognitive status: Within functional limits for tasks assessed   SENSATION: WFL; slight decrease to RLE   COORDINATION: Heel slide: limited LLE    MUSCLE TONE: LLE: Mild and Moderate   POSTURE: No Significant postural limitations  LOWER EXTREMITY ROM:     WFL  LOWER EXTREMITY MMT:    MMT Right Eval Left Eval  Hip flexion 6.4 4.2  Hip extension    Hip abduction 7.9 3.4  Hip adduction 7.5 8.5  Hip internal rotation    Hip external rotation    Knee flexion 11.1 10.2  Knee extension 7.8 10  Ankle dorsiflexion 10.8 6.8  Ankle plantarflexion 7.2 6.9  Ankle inversion    Ankle eversion    (Blank rows = not tested)  BED MOBILITY:  Scooting into bed is very challenging at end of night  TRANSFERS: Assistive device utilized: Single point cane  Sit to stand: Complete Independence Stand to sit: Complete Independence Chair to chair: Complete Independence    STAIRS: Level of Assistance: CGA Stair Negotiation Technique:  Alternating Pattern  with Single Rail on Right Number of Stairs: 5  Height of Stairs: 6 inch    GAIT: Gait pattern: step through pattern and poor foot clearance- Left Distance walked: 60 ft Assistive device utilized: None Level of assistance: CGA Comments: L foot scuffing  FUNCTIONAL TESTS:  5 times sit to stand: 13 seconds with arms crossed.  6 minute walk test: next session  Functional gait assessment: 14/30  PATIENT SURVEYS:  ABC scale 60.5%                                                                                                                               TREATMENT DATE: 07/23/23    TherEx: Supine:  RLE hamstring stretch 60 seconds R knee to chest 60 seconds R piriformis stretch 60 seconds Posterior pelvic tilt 10; 2 sets  BTB  abduction 20x  TrA with swiss ball 10x 3 second holds TrA with swiss ball and UE raises 10x each LE  Green swiss ball hamstring curl 15x  Adduction ball squeeze 20x Adduction with heel raise  10x  PATIENT EDUCATION: Education details: goals, POC Person educated: Patient Education method: Explanation, Demonstration, Tactile cues, and Verbal cues Education comprehension: verbalized understanding, returned demonstration, verbal cues required, and tactile cues required   HOME EXERCISE PROGRAM: Access Code: [email or web address] URL: [email or web address] Date: 05/29/2023 Prepared  by: Briannon Boggio  Exercises - Towel Scrunches  - 1 x daily - 7 x weekly - 2 sets - 10 reps - 5 hold - Toe Spreading  - 1 x daily - 7 x weekly - 2 sets - 10 reps - 5 hold - Seated Ankle Alphabet  - 1 x daily - 7 x weekly - 2 sets - 1 reps - 5 hold  GOALS: Goals reviewed with patient? Yes  SHORT TERM GOALS: Target date: 05/22/2023  Patient will be independent in home exercise program to improve strength/mobility for better functional independence with ADLs. Baseline: 4/14: compliant  Goal status: MET     LONG TERM GOALS: Target date:  10/08/2023    Patient will increase Functional Gait Assessment score to >25/30 as to reduce fall risk and improve dynamic gait safety with community ambulation. Baseline: 1/28: 14/30  4/14: 17/30  Goal status: Partially Met  2.   Patient (< 64 years old) will complete five times sit to stand test in < 10 seconds indicating an increased LE strength and improved balance. Baseline: 1/28: 13 seconds 4/14: 10.72 seconds no hands  Goal status: partially met   3.  Patient will increase six minute walk test distance to >1000 for progression to community ambulator and improve gait ability Baseline: Perform next session 3/4: 820 ft 4/14: 890 ft with SPC  Goal status: Partially Met   4.  Patient will return to nightly ambulation's with less than five foot scuffs per ambulation to return to PLOF.  Baseline: 1/28: limited ambulation 4/14: some limitations pending on day. Encouraged to perform multiple short walks  Goal status: Partially Met   5. Patient will increase ABC scale score >80% to demonstrate better functional mobility and better confidence with ADLs.  Baseline: 1/28: 60%  4/14: 65%  Goal status: Partially Met   ASSESSMENT:  CLINICAL IMPRESSION:    Patient is very fatigued throughout session requiring gentle performance of interventions. Rest and guidance through RPE scale performed throughout session.   Pt will continue to benefit from skilled therapy to address remaining deficits in order to improve overall QoL and return to PLOF.      OBJECTIVE IMPAIRMENTS: Abnormal gait, decreased activity tolerance, decreased balance, decreased coordination, decreased endurance, decreased mobility, difficulty walking, decreased strength, impaired perceived functional ability, impaired flexibility, impaired UE functional use, and improper body mechanics.   ACTIVITY LIMITATIONS: carrying, lifting, bending, sitting, standing, squatting, stairs, transfers, bed mobility, dressing, reach over head,  hygiene/grooming, locomotion level, and caring for others  PARTICIPATION LIMITATIONS: meal prep, cleaning, laundry, interpersonal relationship, driving, shopping, community activity, and occupation  PERSONAL FACTORS: Age, Fitness, Past/current experiences, Profession, Time since onset of injury/illness/exacerbation, and 1-2 comorbidities: MS anxiety  are also affecting patient's functional outcome.   REHAB POTENTIAL: Good  CLINICAL DECISION MAKING: Evolving/moderate complexity  EVALUATION COMPLEXITY: Moderate  PLAN:  PT FREQUENCY: 2x/week  PT DURATION: 12 weeks  PLANNED INTERVENTIONS: 97164- PT Re-evaluation, 97110-Therapeutic exercises, 97530- Therapeutic activity, 97112- Neuromuscular re-education, 97535- Self Care, 84696- Manual therapy, [phone]- Gait training, [phone]- Orthotic Fit/training, [phone]- Canalith repositioning, Patient/Family education, Balance training, Stair training, Taping, Dry Needling, Joint mobilization, Joint manipulation, Spinal mobilization, Vestibular training, Visual/preceptual remediation/compensation, DME instructions, Cryotherapy, and Moist heat  PLAN FOR NEXT SESSION:  airex pad/balance, LLE strengthening, HEP , LLE coordination. Adding cognitive/manual dual task with dynamic balance activities   Quartez Lagos  Brain Cahill, PT, DPT Physical Therapist - [facility]  Outpatient Physical Therapy- Main Campus [phone]    07/23/23, 1:59  PM

## 2023-07-23 ENCOUNTER — Ambulatory Visit: Payer: No Typology Code available for payment source

## 2023-07-23 DIAGNOSIS — R262 Difficulty in walking, not elsewhere classified: Secondary | ICD-10-CM | POA: Diagnosis not present

## 2023-07-23 DIAGNOSIS — R269 Unspecified abnormalities of gait and mobility: Secondary | ICD-10-CM

## 2023-07-23 DIAGNOSIS — R2681 Unsteadiness on feet: Secondary | ICD-10-CM

## 2023-07-25 ENCOUNTER — Ambulatory Visit: Payer: No Typology Code available for payment source

## 2023-07-30 ENCOUNTER — Ambulatory Visit: Payer: No Typology Code available for payment source

## 2023-07-31 NOTE — Therapy (Signed)
 OUTPATIENT PHYSICAL THERAPY NEURO TREATMENT     Patient Name: Michelle Ruiz MRN: [id number] DOB:[birth year], 53 y.o., female Today's Date: 08/01/2023   PCP: Rex Castor  REFERRING PROVIDER: Devora Folks K  END OF SESSION:  PT End of Session - 08/01/23 1445     Visit Number 15    Number of Visits 36    Date for PT Re-Evaluation 10/08/23    PT Start Time 1445    PT Stop Time 1529    PT Time Calculation (min) 44 min    Equipment Utilized During Treatment Gait belt    Activity Tolerance Patient tolerated treatment well    Behavior During Therapy WFL for tasks assessed/performed                      Past Medical History:  Diagnosis Date   Abnormal Pap smear of cervix    Actinic keratosis    Anxiety    Basal cell carcinoma 04/14/2008   Right upper ant. thigh. BCC with sclerosis.   Basal cell carcinoma 08/14/2008   Right lat. lower thigh. Superficial.    Basal cell carcinoma 04/29/2019   Right mid forearm. Superficial and nodular patterns. EDC.   Basal cell carcinoma 08/17/2020   Right upper antecubital. EDC.   Basal cell carcinoma 08/23/2021   Left Lower Leg, EDC   Kidney stones    MS (multiple sclerosis) (HCC)    UTI (urinary tract infection)    Past Surgical History:  Procedure Laterality Date   BREAST BIOPSY Right 2010   stereotactic biopsy/ neg/ dr.byrnett   BREAST BIOPSY Left 2012   neg/dr. brynett   CESAREAN SECTION  2008   RPH   TONSILLECTOMY  2006   There are no active problems to display for this patient.   ONSET DATE: diagnosed 12 years ago.   REFERRING DIAG: MS, gait and mobility   THERAPY DIAG:  Difficulty in walking, not elsewhere classified  Abnormality of gait and mobility  Unsteadiness on feet  Muscle weakness (generalized)  Rationale for Evaluation and Treatment: Rehabilitation  SUBJECTIVE:                                                                                                                                                                                              SUBJECTIVE STATEMENT:  Patient  missed last session due to having to take dog to emergency vet.   Pt accompanied by: self  PERTINENT HISTORY: Patient was seen by physical therapy until September of last year. Pmh includes MS, UTI, anxiety. Has extreme spasticity in LLE. Extreme cold and heat affect her  LLE. Patient is now taking hormonal medication. Patient is a Chartered certified accountant.   PAIN:  Are you having pain? No  PRECAUTIONS: None  RED FLAGS: None   WEIGHT BEARING RESTRICTIONS: No  FALLS: Has patient fallen in last 6 months? No  LIVING ENVIRONMENT: Lives with: lives with their family Lives in: House/apartment Stairs:  no stairs to enter, lives on second floor Has following equipment at home: Single point cane and shower chair  PLOF: Independent  PATIENT GOALS: continue walking as long as she can.   OBJECTIVE:  Note: Objective measures were completed at Evaluation unless otherwise noted.  DIAGNOSTIC FINDINGS: n/a  COGNITION: Overall cognitive status: Within functional limits for tasks assessed   SENSATION: WFL; slight decrease to RLE   COORDINATION: Heel slide: limited LLE    MUSCLE TONE: LLE: Mild and Moderate   POSTURE: No Significant postural limitations  LOWER EXTREMITY ROM:     WFL  LOWER EXTREMITY MMT:    MMT Right Eval Left Eval  Hip flexion 6.4 4.2  Hip extension    Hip abduction 7.9 3.4  Hip adduction 7.5 8.5  Hip internal rotation    Hip external rotation    Knee flexion 11.1 10.2  Knee extension 7.8 10  Ankle dorsiflexion 10.8 6.8  Ankle plantarflexion 7.2 6.9  Ankle inversion    Ankle eversion    (Blank rows = not tested)  BED MOBILITY:  Scooting into bed is very challenging at end of night  TRANSFERS: Assistive device utilized: Single point cane  Sit to stand: Complete Independence Stand to sit: Complete Independence Chair to chair: Complete  Independence    STAIRS: Level of Assistance: CGA Stair Negotiation Technique: Alternating Pattern  with Single Rail on Right Number of Stairs: 5  Height of Stairs: 6 inch    GAIT: Gait pattern: step through pattern and poor foot clearance- Left Distance walked: 60 ft Assistive device utilized: None Level of assistance: CGA Comments: L foot scuffing  FUNCTIONAL TESTS:  5 times sit to stand: 13 seconds with arms crossed.  6 minute walk test: next session  Functional gait assessment: 14/30  PATIENT SURVEYS:  ABC scale 60.5%                                                                                                                               TREATMENT DATE: 08/01/23   TA- To improve functional movements patterns for everyday tasks   6" step: -toe taps 10x each LE -step up/down 10x each LE -lateral step up/down 10x each LE   Seated UE/LE raises opposite 10x each side   TE- To improve strength, endurance, mobility, and function of specific targeted muscle groups or improve joint range of motion or improve muscle flexibility  Seated:  Df/pf 20x half foam roller Lateral step over half foam roller 10x each LE RTB Eversion 15x each LE   PATIENT EDUCATION: Education details: goals, POC Person educated: Patient Education method: Explanation,  Demonstration, Tactile cues, and Verbal cues Education comprehension: verbalized understanding, returned demonstration, verbal cues required, and tactile cues required   HOME EXERCISE PROGRAM: Access Code: [email or web address] URL: [email or web address] Date: 05/29/2023 Prepared by: Khloey Chern  Exercises - Towel Scrunches  - 1 x daily - 7 x weekly - 2 sets - 10 reps - 5 hold - Toe Spreading  - 1 x daily - 7 x weekly - 2 sets - 10 reps - 5 hold - Seated Ankle Alphabet  - 1 x daily - 7 x weekly - 2 sets - 1 reps - 5 hold  GOALS: Goals reviewed with patient? Yes  SHORT TERM GOALS: Target date: 05/22/2023  Patient  will be independent in home exercise program to improve strength/mobility for better functional independence with ADLs. Baseline: 4/14: compliant  Goal status: MET     LONG TERM GOALS: Target date: 10/08/2023    Patient will increase Functional Gait Assessment score to >25/30 as to reduce fall risk and improve dynamic gait safety with community ambulation. Baseline: 1/28: 14/30  4/14: 17/30  Goal status: Partially Met  2.   Patient (< 45 years old) will complete five times sit to stand test in < 10 seconds indicating an increased LE strength and improved balance. Baseline: 1/28: 13 seconds 4/14: 10.72 seconds no hands  Goal status: partially met   3.  Patient will increase six minute walk test distance to >1000 for progression to community ambulator and improve gait ability Baseline: Perform next session 3/4: 820 ft 4/14: 890 ft with SPC  Goal status: Partially Met   4.  Patient will return to nightly ambulation's with less than five foot scuffs per ambulation to return to PLOF.  Baseline: 1/28: limited ambulation 4/14: some limitations pending on day. Encouraged to perform multiple short walks  Goal status: Partially Met   5. Patient will increase ABC scale score >80% to demonstrate better functional mobility and better confidence with ADLs.  Baseline: 1/28: 60%  4/14: 65%  Goal status: Partially Met   ASSESSMENT:  CLINICAL IMPRESSION:    Patient tolerates progressive mobility interventions. Stair/step negotiation is very challenging requiring rest after each intervention. Lateral step over hurdle with LLE is very challenging and requires cues for reset and rest between each rep to provide optimal movement due to fatigue. Pt will continue to benefit from skilled therapy to address remaining deficits in order to improve overall QoL and return to PLOF.      OBJECTIVE IMPAIRMENTS: Abnormal gait, decreased activity tolerance, decreased balance, decreased coordination, decreased  endurance, decreased mobility, difficulty walking, decreased strength, impaired perceived functional ability, impaired flexibility, impaired UE functional use, and improper body mechanics.   ACTIVITY LIMITATIONS: carrying, lifting, bending, sitting, standing, squatting, stairs, transfers, bed mobility, dressing, reach over head, hygiene/grooming, locomotion level, and caring for others  PARTICIPATION LIMITATIONS: meal prep, cleaning, laundry, interpersonal relationship, driving, shopping, community activity, and occupation  PERSONAL FACTORS: Age, Fitness, Past/current experiences, Profession, Time since onset of injury/illness/exacerbation, and 1-2 comorbidities: MS anxiety  are also affecting patient's functional outcome.   REHAB POTENTIAL: Good  CLINICAL DECISION MAKING: Evolving/moderate complexity  EVALUATION COMPLEXITY: Moderate  PLAN:  PT FREQUENCY: 2x/week  PT DURATION: 12 weeks  PLANNED INTERVENTIONS: 97164- PT Re-evaluation, 97110-Therapeutic exercises, 97530- Therapeutic activity, 97112- Neuromuscular re-education, 97535- Self Care, 78469- Manual therapy, [phone]- Gait training, [phone]- Orthotic Fit/training, [phone]- Canalith repositioning, Patient/Family education, Balance training, Stair training, Taping, Dry Needling, Joint mobilization, Joint manipulation, Spinal mobilization, Vestibular training, Visual/preceptual remediation/compensation, DME instructions, Cryotherapy, and  Moist heat  PLAN FOR NEXT SESSION:  airex pad/balance, LLE strengthening, HEP , LLE coordination. Adding cognitive/manual dual task with dynamic balance activities   Na Waldrip  Brain Cahill, PT, DPT Physical Therapist - [facility]  Outpatient Physical Therapy- Main Campus [phone]    08/01/23, 3:52 PM

## 2023-08-01 ENCOUNTER — Ambulatory Visit: Payer: No Typology Code available for payment source | Attending: Neurology

## 2023-08-01 DIAGNOSIS — R2681 Unsteadiness on feet: Secondary | ICD-10-CM | POA: Insufficient documentation

## 2023-08-01 DIAGNOSIS — R269 Unspecified abnormalities of gait and mobility: Secondary | ICD-10-CM | POA: Diagnosis present

## 2023-08-01 DIAGNOSIS — R262 Difficulty in walking, not elsewhere classified: Secondary | ICD-10-CM | POA: Diagnosis present

## 2023-08-01 DIAGNOSIS — M6281 Muscle weakness (generalized): Secondary | ICD-10-CM | POA: Insufficient documentation

## 2023-08-01 DIAGNOSIS — Z9181 History of falling: Secondary | ICD-10-CM | POA: Insufficient documentation

## 2023-08-02 NOTE — Therapy (Incomplete)
 OUTPATIENT PHYSICAL THERAPY NEURO TREATMENT     Patient Name: Michelle Ruiz MRN: [id number] DOB:Mar 31, 1970, 53 y.o., female Today's Date: 08/02/2023   PCP: Rex Castor  REFERRING PROVIDER: Devora Folks K  END OF SESSION:             Past Medical History:  Diagnosis Date   Abnormal Pap smear of cervix    Actinic keratosis    Anxiety    Basal cell carcinoma 04/14/2008   Right upper ant. thigh. BCC with sclerosis.   Basal cell carcinoma 08/14/2008   Right lat. lower thigh. Superficial.    Basal cell carcinoma 04/29/2019   Right mid forearm. Superficial and nodular patterns. EDC.   Basal cell carcinoma 08/17/2020   Right upper antecubital. EDC.   Basal cell carcinoma 08/23/2021   Left Lower Leg, EDC   Kidney stones    MS (multiple sclerosis) (HCC)    UTI (urinary tract infection)    Past Surgical History:  Procedure Laterality Date   BREAST BIOPSY Right 2010   stereotactic biopsy/ neg/ dr.byrnett   BREAST BIOPSY Left 2012   neg/dr. brynett   CESAREAN SECTION  2008   RPH   TONSILLECTOMY  2006   There are no active problems to display for this patient.   ONSET DATE: diagnosed 12 years ago.   REFERRING DIAG: MS, gait and mobility   THERAPY DIAG:  No diagnosis found.  Rationale for Evaluation and Treatment: Rehabilitation  SUBJECTIVE:                                                                                                                                                                                             SUBJECTIVE STATEMENT:  ***  Pt accompanied by: self  PERTINENT HISTORY: Patient was seen by physical therapy until September of last year. Pmh includes MS, UTI, anxiety. Has extreme spasticity in LLE. Extreme cold and heat affect her LLE. Patient is now taking hormonal medication. Patient is a Chartered certified accountant.   PAIN:  Are you having pain? No  PRECAUTIONS: None  RED FLAGS: None   WEIGHT BEARING RESTRICTIONS:  No  FALLS: Has patient fallen in last 6 months? No  LIVING ENVIRONMENT: Lives with: lives with their family Lives in: House/apartment Stairs: no stairs to enter, lives on second floor Has following equipment at home: Single point cane and shower chair  PLOF: Independent  PATIENT GOALS: continue walking as long as she can.   OBJECTIVE:  Note: Objective measures were completed at Evaluation unless otherwise noted.  DIAGNOSTIC FINDINGS: n/a  COGNITION: Overall cognitive status: Within functional limits for tasks assessed   SENSATION:  WFL; slight decrease to RLE   COORDINATION: Heel slide: limited LLE    MUSCLE TONE: LLE: Mild and Moderate   POSTURE: No Significant postural limitations  LOWER EXTREMITY ROM:     WFL  LOWER EXTREMITY MMT:    MMT Right Eval Left Eval  Hip flexion 6.4 4.2  Hip extension    Hip abduction 7.9 3.4  Hip adduction 7.5 8.5  Hip internal rotation    Hip external rotation    Knee flexion 11.1 10.2  Knee extension 7.8 10  Ankle dorsiflexion 10.8 6.8  Ankle plantarflexion 7.2 6.9  Ankle inversion    Ankle eversion    (Blank rows = not tested)  BED MOBILITY:  Scooting into bed is very challenging at end of night  TRANSFERS: Assistive device utilized: Single point cane  Sit to stand: Complete Independence Stand to sit: Complete Independence Chair to chair: Complete Independence    STAIRS: Level of Assistance: CGA Stair Negotiation Technique: Alternating Pattern  with Single Rail on Right Number of Stairs: 5  Height of Stairs: 6 inch    GAIT: Gait pattern: step through pattern and poor foot clearance- Left Distance walked: 60 ft Assistive device utilized: None Level of assistance: CGA Comments: L foot scuffing  FUNCTIONAL TESTS:  5 times sit to stand: 13 seconds with arms crossed.  6 minute walk test: next session  Functional gait assessment: 14/30  PATIENT SURVEYS:  ABC scale 60.5%                                                                                                                                TREATMENT DATE: 08/02/23   TA- To improve functional movements patterns for everyday tasks   6" step: -toe taps 10x each LE -step up/down 10x each LE -lateral step up/down 10x each LE   Seated UE/LE raises opposite 10x each side   TE- To improve strength, endurance, mobility, and function of specific targeted muscle groups or improve joint range of motion or improve muscle flexibility  Seated:  Df/pf 20x half foam roller Lateral step over half foam roller 10x each LE RTB Eversion 15x each LE   PATIENT EDUCATION: Education details: goals, POC Person educated: Patient Education method: Explanation, Demonstration, Tactile cues, and Verbal cues Education comprehension: verbalized understanding, returned demonstration, verbal cues required, and tactile cues required   HOME EXERCISE PROGRAM: Access Code: [email or web address] URL: [email or web address] Date: 05/29/2023 Prepared by: Merlin Golden  Exercises - Towel Scrunches  - 1 x daily - 7 x weekly - 2 sets - 10 reps - 5 hold - Toe Spreading  - 1 x daily - 7 x weekly - 2 sets - 10 reps - 5 hold - Seated Ankle Alphabet  - 1 x daily - 7 x weekly - 2 sets - 1 reps - 5 hold  GOALS: Goals reviewed with patient? Yes  SHORT TERM GOALS: Target date: 05/22/2023  Patient will be independent in home  exercise program to improve strength/mobility for better functional independence with ADLs. Baseline: 4/14: compliant  Goal status: MET     LONG TERM GOALS: Target date: 10/08/2023    Patient will increase Functional Gait Assessment score to >25/30 as to reduce fall risk and improve dynamic gait safety with community ambulation. Baseline: 1/28: 14/30  4/14: 17/30  Goal status: Partially Met  2.   Patient (< 13 years old) will complete five times sit to stand test in < 10 seconds indicating an increased LE strength and improved  balance. Baseline: 1/28: 13 seconds 4/14: 10.72 seconds no hands  Goal status: partially met   3.  Patient will increase six minute walk test distance to >1000 for progression to community ambulator and improve gait ability Baseline: Perform next session 3/4: 820 ft 4/14: 890 ft with SPC  Goal status: Partially Met   4.  Patient will return to nightly ambulation's with less than five foot scuffs per ambulation to return to PLOF.  Baseline: 1/28: limited ambulation 4/14: some limitations pending on day. Encouraged to perform multiple short walks  Goal status: Partially Met   5. Patient will increase ABC scale score >80% to demonstrate better functional mobility and better confidence with ADLs.  Baseline: 1/28: 60%  4/14: 65%  Goal status: Partially Met   ASSESSMENT:  CLINICAL IMPRESSION:    *** Pt will continue to benefit from skilled therapy to address remaining deficits in order to improve overall QoL and return to PLOF.      OBJECTIVE IMPAIRMENTS: Abnormal gait, decreased activity tolerance, decreased balance, decreased coordination, decreased endurance, decreased mobility, difficulty walking, decreased strength, impaired perceived functional ability, impaired flexibility, impaired UE functional use, and improper body mechanics.   ACTIVITY LIMITATIONS: carrying, lifting, bending, sitting, standing, squatting, stairs, transfers, bed mobility, dressing, reach over head, hygiene/grooming, locomotion level, and caring for others  PARTICIPATION LIMITATIONS: meal prep, cleaning, laundry, interpersonal relationship, driving, shopping, community activity, and occupation  PERSONAL FACTORS: Age, Fitness, Past/current experiences, Profession, Time since onset of injury/illness/exacerbation, and 1-2 comorbidities: MS anxiety are also affecting patient's functional outcome.   REHAB POTENTIAL: Good  CLINICAL DECISION MAKING: Evolving/moderate complexity  EVALUATION COMPLEXITY:  Moderate  PLAN:  PT FREQUENCY: 2x/week  PT DURATION: 12 weeks  PLANNED INTERVENTIONS: 97164- PT Re-evaluation, 97110-Therapeutic exercises, 97530- Therapeutic activity, 97112- Neuromuscular re-education, 97535- Self Care, 16109- Manual therapy, [phone]- Gait training, [phone]- Orthotic Fit/training, [phone]- Canalith repositioning, Patient/Family education, Balance training, Stair training, Taping, Dry Needling, Joint mobilization, Joint manipulation, Spinal mobilization, Vestibular training, Visual/preceptual remediation/compensation, DME instructions, Cryotherapy, and Moist heat  PLAN FOR NEXT SESSION:  airex pad/balance, LLE strengthening, HEP , LLE coordination. Adding cognitive/manual dual task with dynamic balance activities   Lacreasha Hinds  Brain Cahill, PT, DPT Physical Therapist - [facility]  Outpatient Physical Therapy- Main Campus [phone]    08/02/23, 5:10 PM

## 2023-08-06 ENCOUNTER — Ambulatory Visit: Payer: No Typology Code available for payment source

## 2023-08-08 ENCOUNTER — Ambulatory Visit: Payer: No Typology Code available for payment source

## 2023-08-13 ENCOUNTER — Ambulatory Visit: Payer: No Typology Code available for payment source

## 2023-08-13 DIAGNOSIS — M6281 Muscle weakness (generalized): Secondary | ICD-10-CM

## 2023-08-13 DIAGNOSIS — R262 Difficulty in walking, not elsewhere classified: Secondary | ICD-10-CM

## 2023-08-13 DIAGNOSIS — R2681 Unsteadiness on feet: Secondary | ICD-10-CM

## 2023-08-13 DIAGNOSIS — R269 Unspecified abnormalities of gait and mobility: Secondary | ICD-10-CM

## 2023-08-13 DIAGNOSIS — Z9181 History of falling: Secondary | ICD-10-CM

## 2023-08-13 NOTE — Therapy (Signed)
 OUTPATIENT PHYSICAL THERAPY NEURO TREATMENT     Patient Name: Michelle Ruiz MRN: [id number] DOB:10/19/1970, 53 y.o., female Today's Date: 08/13/2023   PCP: Rex Castor  REFERRING PROVIDER: Devora Folks K  END OF SESSION:  PT End of Session - 08/13/23 1410     Visit Number 16    Number of Visits 36    Date for PT Re-Evaluation 10/08/23    Progress Note Due on Visit 70    PT Start Time 1403    PT Stop Time 1444    PT Time Calculation (min) 41 min    Equipment Utilized During Treatment Gait belt    Activity Tolerance Patient tolerated treatment well    Behavior During Therapy WFL for tasks assessed/performed                       Past Medical History:  Diagnosis Date   Abnormal Pap smear of cervix    Actinic keratosis    Anxiety    Basal cell carcinoma 04/14/2008   Right upper ant. thigh. BCC with sclerosis.   Basal cell carcinoma 08/14/2008   Right lat. lower thigh. Superficial.    Basal cell carcinoma 04/29/2019   Right mid forearm. Superficial and nodular patterns. EDC.   Basal cell carcinoma 08/17/2020   Right upper antecubital. EDC.   Basal cell carcinoma 08/23/2021   Left Lower Leg, EDC   Kidney stones    MS (multiple sclerosis) (HCC)    UTI (urinary tract infection)    Past Surgical History:  Procedure Laterality Date   BREAST BIOPSY Right 2010   stereotactic biopsy/ neg/ dr.byrnett   BREAST BIOPSY Left 2012   neg/dr. brynett   CESAREAN SECTION  2008   RPH   TONSILLECTOMY  2006   There are no active problems to display for this patient.   ONSET DATE: diagnosed 12 years ago.   REFERRING DIAG: MS, gait and mobility   THERAPY DIAG:  Difficulty in walking, not elsewhere classified  Abnormality of gait and mobility  Unsteadiness on feet  Muscle weakness (generalized)  History of falling  Rationale for Evaluation and Treatment: Rehabilitation  SUBJECTIVE:                                                                                                                                                                                              SUBJECTIVE STATEMENT:  Patient reports she asked for a tougher workout last time and regretted it later- states unable to do much the next day and states she "overdid" it. States she feel the spasticity is the most concerning  physical factor.   Pt accompanied by: self  PERTINENT HISTORY: Patient was seen by physical therapy until September of last year. Pmh includes MS, UTI, anxiety. Has extreme spasticity in LLE. Extreme cold and heat affect her LLE. Patient is now taking hormonal medication. Patient is a Chartered certified accountant.   PAIN:  Are you having pain? No  PRECAUTIONS: None  RED FLAGS: None   WEIGHT BEARING RESTRICTIONS: No  FALLS: Has patient fallen in last 6 months? No  LIVING ENVIRONMENT: Lives with: lives with their family Lives in: House/apartment Stairs: no stairs to enter, lives on second floor Has following equipment at home: Single point cane and shower chair  PLOF: Independent  PATIENT GOALS: continue walking as long as she can.   OBJECTIVE:  Note: Objective measures were completed at Evaluation unless otherwise noted.  DIAGNOSTIC FINDINGS: n/a  COGNITION: Overall cognitive status: Within functional limits for tasks assessed   SENSATION: WFL; slight decrease to RLE   COORDINATION: Heel slide: limited LLE    MUSCLE TONE: LLE: Mild and Moderate   POSTURE: No Significant postural limitations  LOWER EXTREMITY ROM:     WFL  LOWER EXTREMITY MMT:    MMT Right Eval Left Eval  Hip flexion 6.4 4.2  Hip extension    Hip abduction 7.9 3.4  Hip adduction 7.5 8.5  Hip internal rotation    Hip external rotation    Knee flexion 11.1 10.2  Knee extension 7.8 10  Ankle dorsiflexion 10.8 6.8  Ankle plantarflexion 7.2 6.9  Ankle inversion    Ankle eversion    (Blank rows = not tested)  BED MOBILITY:  Scooting into bed is very  challenging at end of night  TRANSFERS: Assistive device utilized: Single point cane  Sit to stand: Complete Independence Stand to sit: Complete Independence Chair to chair: Complete Independence    STAIRS: Level of Assistance: CGA Stair Negotiation Technique: Alternating Pattern  with Single Rail on Right Number of Stairs: 5  Height of Stairs: 6 inch    GAIT: Gait pattern: step through pattern and poor foot clearance- Left Distance walked: 60 ft Assistive device utilized: None Level of assistance: CGA Comments: L foot scuffing  FUNCTIONAL TESTS:  5 times sit to stand: 13 seconds with arms crossed.  6 minute walk test: next session  Functional gait assessment: 14/30  PATIENT SURVEYS:  ABC scale 60.5%                                                                                                                               TREATMENT DATE: 08/13/23     TE- To improve strength, endurance, mobility, and function of specific targeted muscle groups or improve joint range of motion or improve muscle flexibility  Reviewed self stretching: (hold 30 sec x 3 each LE)   Supine- Knee to chest Suipne- (at [facility]- laying left LE off edge of mat) for Hip flex stretch Supine- Hamstring with strap each LE  Supine- Fig 4  Hip ER/IR mobs (feet out wide)  Lower trunk rotation Seated Hamstring stretch Seated fig 4 stretch  Self care:  Added above stretching to HEP (See above and below section for details)    PATIENT EDUCATION: Education details: Exercise technique; importance of daily stretching Person educated: Patient Education method: Explanation, Demonstration, Tactile cues, and Verbal cues Education comprehension: verbalized understanding, returned demonstration, verbal cues required, and tactile cues required   HOME EXERCISE PROGRAM: Access Code: [email or web address] URL: [email or web address] Date: 08/13/2023 Prepared by: Ferrell Hu  Exercises - Hooklying  Single Knee to Chest Stretch  - 1-2 x daily - 3 sets - 30 sec  hold - Supine Lower Trunk Rotation  - 1-2 x daily - 3 sets - 30 sec hold - Supine Hamstring Stretch with Strap  - 1-2 x daily - 3 sets - 30 sec hold - Seated Hamstring Stretch  - 1-2 x daily - 3 sets - 30 sec hold - Supine Hip Internal and External Rotation  - 1-2 x daily - 3 sets - 10 reps - Seated Hip External Rotation Stretch  - 1 x daily - 3 sets - 30 sec hold - Supine Figure 4 Piriformis Stretch  - 1 x daily - 3 sets - 30 sec hold       Access Code: [email or web address] URL: [email or web address] Date: 05/29/2023 Prepared by: Marina  Moser  Exercises - Towel Scrunches  - 1 x daily - 7 x weekly - 2 sets - 10 reps - 5 hold - Toe Spreading  - 1 x daily - 7 x weekly - 2 sets - 10 reps - 5 hold - Seated Ankle Alphabet  - 1 x daily - 7 x weekly - 2 sets - 1 reps - 5 hold  GOALS: Goals reviewed with patient? Yes  SHORT TERM GOALS: Target date: 05/22/2023  Patient will be independent in home exercise program to improve strength/mobility for better functional independence with ADLs. Baseline: 4/14: compliant  Goal status: MET     LONG TERM GOALS: Target date: 10/08/2023    Patient will increase Functional Gait Assessment score to >25/30 as to reduce fall risk and improve dynamic gait safety with community ambulation. Baseline: 1/28: 14/30  4/14: 17/30  Goal status: Partially Met  2.   Patient (< 2 years old) will complete five times sit to stand test in < 10 seconds indicating an increased LE strength and improved balance. Baseline: 1/28: 13 seconds 4/14: 10.72 seconds no hands  Goal status: partially met   3.  Patient will increase six minute walk test distance to >1000 for progression to community ambulator and improve gait ability Baseline: Perform next session 3/4: 820 ft 4/14: 890 ft with SPC  Goal status: Partially Met   4.  Patient will return to nightly ambulation's with less than five foot scuffs per  ambulation to return to PLOF.  Baseline: 1/28: limited ambulation 4/14: some limitations pending on day. Encouraged to perform multiple short walks  Goal status: Partially Met   5. Patient will increase ABC scale score >80% to demonstrate better functional mobility and better confidence with ADLs.  Baseline: 1/28: 60%  4/14: 65%  Goal status: Partially Met   ASSESSMENT:  CLINICAL IMPRESSION:    Patient presents with good motivation for today's treatment. She reported that she was not really stretching much (other than in PT sessions) so spent time focusing on stretching to be performed at home. Patient responded well to all stretching and only complaining of stretching  sensation. Issued handout and will need review to check compliance. Pt will continue to benefit from skilled therapy to address remaining deficits in order to improve overall QoL and return to PLOF.      OBJECTIVE IMPAIRMENTS: Abnormal gait, decreased activity tolerance, decreased balance, decreased coordination, decreased endurance, decreased mobility, difficulty walking, decreased strength, impaired perceived functional ability, impaired flexibility, impaired UE functional use, and improper body mechanics.   ACTIVITY LIMITATIONS: carrying, lifting, bending, sitting, standing, squatting, stairs, transfers, bed mobility, dressing, reach over head, hygiene/grooming, locomotion level, and caring for others  PARTICIPATION LIMITATIONS: meal prep, cleaning, laundry, interpersonal relationship, driving, shopping, community activity, and occupation  PERSONAL FACTORS: Age, Fitness, Past/current experiences, Profession, Time since onset of injury/illness/exacerbation, and 1-2 comorbidities: MS anxiety are also affecting patient's functional outcome.   REHAB POTENTIAL: Good  CLINICAL DECISION MAKING: Evolving/moderate complexity  EVALUATION COMPLEXITY: Moderate  PLAN:  PT FREQUENCY: 2x/week  PT DURATION: 12 weeks  PLANNED  INTERVENTIONS: 97164- PT Re-evaluation, 97110-Therapeutic exercises, 97530- Therapeutic activity, 97112- Neuromuscular re-education, 97535- Self Care, 82956- Manual therapy, [phone]- Gait training, [phone]- Orthotic Fit/training, [phone]- Canalith repositioning, Patient/Family education, Balance training, Stair training, Taping, Dry Needling, Joint mobilization, Joint manipulation, Spinal mobilization, Vestibular training, Visual/preceptual remediation/compensation, DME instructions, Cryotherapy, and Moist heat  PLAN FOR NEXT SESSION:  airex pad/balance, LLE strengthening, HEP , LLE coordination. Adding cognitive/manual dual task with dynamic balance activities   Ossie Blend, PT Physical Therapist - [facility]  Outpatient Physical Therapy- Main Campus [phone]    08/13/23, 4:18 PM

## 2023-08-14 NOTE — Therapy (Signed)
 OUTPATIENT PHYSICAL THERAPY NEURO TREATMENT     Patient Name: Michelle Ruiz MRN: [id number] DOB:[birth year], 53 y.o., female Today's Date: 08/15/2023   PCP: Rex Castor  REFERRING PROVIDER: Devora Folks K  END OF SESSION:  PT End of Session - 08/15/23 1309     Visit Number 17    Number of Visits 36    Date for PT Re-Evaluation 10/08/23    Progress Note Due on Visit 70    PT Start Time 1314    PT Stop Time 1359    PT Time Calculation (min) 45 min    Equipment Utilized During Treatment Gait belt    Activity Tolerance Patient tolerated treatment well    Behavior During Therapy WFL for tasks assessed/performed                        Past Medical History:  Diagnosis Date   Abnormal Pap smear of cervix    Actinic keratosis    Anxiety    Basal cell carcinoma 04/14/2008   Right upper ant. thigh. BCC with sclerosis.   Basal cell carcinoma 08/14/2008   Right lat. lower thigh. Superficial.    Basal cell carcinoma 04/29/2019   Right mid forearm. Superficial and nodular patterns. EDC.   Basal cell carcinoma 08/17/2020   Right upper antecubital. EDC.   Basal cell carcinoma 08/23/2021   Left Lower Leg, EDC   Kidney stones    MS (multiple sclerosis) (HCC)    UTI (urinary tract infection)    Past Surgical History:  Procedure Laterality Date   BREAST BIOPSY Right 2010   stereotactic biopsy/ neg/ dr.byrnett   BREAST BIOPSY Left 2012   neg/dr. brynett   CESAREAN SECTION  2008   RPH   TONSILLECTOMY  2006   There are no active problems to display for this patient.   ONSET DATE: diagnosed 12 years ago.   REFERRING DIAG: MS, gait and mobility   THERAPY DIAG:  Difficulty in walking, not elsewhere classified  Abnormality of gait and mobility  Unsteadiness on feet  Muscle weakness (generalized)  Rationale for Evaluation and Treatment: Rehabilitation  SUBJECTIVE:                                                                                                                                                                                              SUBJECTIVE STATEMENT:  Patient reports she has started doing some of the stretches.   Pt accompanied by: self  PERTINENT HISTORY: Patient was seen by physical therapy until September of last year. Pmh includes MS, UTI, anxiety. Has extreme spasticity in  LLE. Extreme cold and heat affect her LLE. Patient is now taking hormonal medication. Patient is a Chartered certified accountant.   PAIN:  Are you having pain? No  PRECAUTIONS: None  RED FLAGS: None   WEIGHT BEARING RESTRICTIONS: No  FALLS: Has patient fallen in last 6 months? No  LIVING ENVIRONMENT: Lives with: lives with their family Lives in: House/apartment Stairs: no stairs to enter, lives on second floor Has following equipment at home: Single point cane and shower chair  PLOF: Independent  PATIENT GOALS: continue walking as long as she can.   OBJECTIVE:  Note: Objective measures were completed at Evaluation unless otherwise noted.  DIAGNOSTIC FINDINGS: n/a  COGNITION: Overall cognitive status: Within functional limits for tasks assessed   SENSATION: WFL; slight decrease to RLE   COORDINATION: Heel slide: limited LLE    MUSCLE TONE: LLE: Mild and Moderate   POSTURE: No Significant postural limitations  LOWER EXTREMITY ROM:     WFL  LOWER EXTREMITY MMT:    MMT Right Eval Left Eval  Hip flexion 6.4 4.2  Hip extension    Hip abduction 7.9 3.4  Hip adduction 7.5 8.5  Hip internal rotation    Hip external rotation    Knee flexion 11.1 10.2  Knee extension 7.8 10  Ankle dorsiflexion 10.8 6.8  Ankle plantarflexion 7.2 6.9  Ankle inversion    Ankle eversion    (Blank rows = not tested)  BED MOBILITY:  Scooting into bed is very challenging at end of night  TRANSFERS: Assistive device utilized: Single point cane  Sit to stand: Complete Independence Stand to sit: Complete Independence Chair to chair:  Complete Independence    STAIRS: Level of Assistance: CGA Stair Negotiation Technique: Alternating Pattern  with Single Rail on Right Number of Stairs: 5  Height of Stairs: 6 inch    GAIT: Gait pattern: step through pattern and poor foot clearance- Left Distance walked: 60 ft Assistive device utilized: None Level of assistance: CGA Comments: L foot scuffing  FUNCTIONAL TESTS:  5 times sit to stand: 13 seconds with arms crossed.  6 minute walk test: next session  Functional gait assessment: 14/30  PATIENT SURVEYS:  ABC scale 60.5%                                                                                                                               TREATMENT DATE: 08/15/23   TherAct 4 way ankle 10x each LE with RTB   Incline:  -static stand 30 seconds   Squat with UE support 10x  Sit to stand 10x    Seated on dynadisc: -static stand 60 seconds x 2 trials  -lateral weight shift 10x each side -forward/backward 10x  -march 10x   Seated: BTB hamstring curl 15x   Neuro Re-ed: Airex pad: dual task with word game x 8 minutes Airex pad: marching 10x each LE    PATIENT EDUCATION: Education details: Exercise technique; importance of daily  stretching Person educated: Patient Education method: Explanation, Demonstration, Tactile cues, and Verbal cues Education comprehension: verbalized understanding, returned demonstration, verbal cues required, and tactile cues required   HOME EXERCISE PROGRAM: Access Code: [email or web address] URL: [email or web address] Date: 08/13/2023 Prepared by: Ferrell Hu  Exercises - Hooklying Single Knee to Chest Stretch  - 1-2 x daily - 3 sets - 30 sec  hold - Supine Lower Trunk Rotation  - 1-2 x daily - 3 sets - 30 sec hold - Supine Hamstring Stretch with Strap  - 1-2 x daily - 3 sets - 30 sec hold - Seated Hamstring Stretch  - 1-2 x daily - 3 sets - 30 sec hold - Supine Hip Internal and External Rotation  - 1-2 x  daily - 3 sets - 10 reps - Seated Hip External Rotation Stretch  - 1 x daily - 3 sets - 30 sec hold - Supine Figure 4 Piriformis Stretch  - 1 x daily - 3 sets - 30 sec hold       Access Code: [email or web address] URL: [email or web address] Date: 05/29/2023 Prepared by: Samaya Boardley  Exercises - Towel Scrunches  - 1 x daily - 7 x weekly - 2 sets - 10 reps - 5 hold - Toe Spreading  - 1 x daily - 7 x weekly - 2 sets - 10 reps - 5 hold - Seated Ankle Alphabet  - 1 x daily - 7 x weekly - 2 sets - 1 reps - 5 hold  GOALS: Goals reviewed with patient? Yes  SHORT TERM GOALS: Target date: 05/22/2023  Patient will be independent in home exercise program to improve strength/mobility for better functional independence with ADLs. Baseline: 4/14: compliant  Goal status: MET     LONG TERM GOALS: Target date: 10/08/2023    Patient will increase Functional Gait Assessment score to >25/30 as to reduce fall risk and improve dynamic gait safety with community ambulation. Baseline: 1/28: 14/30  4/14: 17/30  Goal status: Partially Met  2.   Patient (< 29 years old) will complete five times sit to stand test in < 10 seconds indicating an increased LE strength and improved balance. Baseline: 1/28: 13 seconds 4/14: 10.72 seconds no hands  Goal status: partially met   3.  Patient will increase six minute walk test distance to >1000 for progression to community ambulator and improve gait ability Baseline: Perform next session 3/4: 820 ft 4/14: 890 ft with SPC  Goal status: Partially Met   4.  Patient will return to nightly ambulation's with less than five foot scuffs per ambulation to return to PLOF.  Baseline: 1/28: limited ambulation 4/14: some limitations pending on day. Encouraged to perform multiple short walks  Goal status: Partially Met   5. Patient will increase ABC scale score >80% to demonstrate better functional mobility and better confidence with ADLs.  Baseline: 1/28: 60%  4/14:  65%  Goal status: Partially Met   ASSESSMENT:  CLINICAL IMPRESSION:    Patient presents with excellent motivation. She is fatigued with prolonged standing requiring intermittent rest breaks. Decreased ankle righting reaction instability noted on airex pad demonstrating increased ability to stabilize self without LOB. Pt will continue to benefit from skilled therapy to address remaining deficits in order to improve overall QoL and return to PLOF.      OBJECTIVE IMPAIRMENTS: Abnormal gait, decreased activity tolerance, decreased balance, decreased coordination, decreased endurance, decreased mobility, difficulty walking, decreased strength, impaired perceived functional ability, impaired flexibility, impaired UE functional use, and improper body mechanics.  ACTIVITY LIMITATIONS: carrying, lifting, bending, sitting, standing, squatting, stairs, transfers, bed mobility, dressing, reach over head, hygiene/grooming, locomotion level, and caring for others  PARTICIPATION LIMITATIONS: meal prep, cleaning, laundry, interpersonal relationship, driving, shopping, community activity, and occupation  PERSONAL FACTORS: Age, Fitness, Past/current experiences, Profession, Time since onset of injury/illness/exacerbation, and 1-2 comorbidities: MS anxiety are also affecting patient's functional outcome.   REHAB POTENTIAL: Good  CLINICAL DECISION MAKING: Evolving/moderate complexity  EVALUATION COMPLEXITY: Moderate  PLAN:  PT FREQUENCY: 2x/week  PT DURATION: 12 weeks  PLANNED INTERVENTIONS: 97164- PT Re-evaluation, 97110-Therapeutic exercises, 97530- Therapeutic activity, 97112- Neuromuscular re-education, 97535- Self Care, 16109- Manual therapy, [phone]- Gait training, [phone]- Orthotic Fit/training, [phone]- Canalith repositioning, Patient/Family education, Balance training, Stair training, Taping, Dry Needling, Joint mobilization, Joint manipulation, Spinal mobilization, Vestibular training,  Visual/preceptual remediation/compensation, DME instructions, Cryotherapy, and Moist heat  PLAN FOR NEXT SESSION:  airex pad/balance, LLE strengthening, HEP , LLE coordination. Adding cognitive/manual dual task with dynamic balance activities   Providencia Hottenstein  Brain Cahill, PT, DPT Physical Therapist - [facility]  Outpatient Physical Therapy- [facility] [phone]      08/15/23, 2:02 PM

## 2023-08-15 ENCOUNTER — Ambulatory Visit: Payer: No Typology Code available for payment source

## 2023-08-15 DIAGNOSIS — M6281 Muscle weakness (generalized): Secondary | ICD-10-CM

## 2023-08-15 DIAGNOSIS — R269 Unspecified abnormalities of gait and mobility: Secondary | ICD-10-CM

## 2023-08-15 DIAGNOSIS — R262 Difficulty in walking, not elsewhere classified: Secondary | ICD-10-CM | POA: Diagnosis not present

## 2023-08-15 DIAGNOSIS — R2681 Unsteadiness on feet: Secondary | ICD-10-CM

## 2023-08-21 NOTE — Therapy (Signed)
 OUTPATIENT PHYSICAL THERAPY NEURO TREATMENT     Patient Name: Michelle Ruiz MRN: [id number] DOB:Mar 10, 1971, 53 y.o., female Today's Date: 08/22/2023   PCP: Rex Castor  REFERRING PROVIDER: Devora Folks K  END OF SESSION:  PT End of Session - 08/22/23 1315     Visit Number 18    Number of Visits 36    Date for PT Re-Evaluation 10/08/23    Progress Note Due on Visit 70    PT Start Time 1315    PT Stop Time 1359    PT Time Calculation (min) 44 min    Equipment Utilized During Treatment Gait belt    Activity Tolerance Patient tolerated treatment well    Behavior During Therapy WFL for tasks assessed/performed                         Past Medical History:  Diagnosis Date   Abnormal Pap smear of cervix    Actinic keratosis    Anxiety    Basal cell carcinoma 04/14/2008   Right upper ant. thigh. BCC with sclerosis.   Basal cell carcinoma 08/14/2008   Right lat. lower thigh. Superficial.    Basal cell carcinoma 04/29/2019   Right mid forearm. Superficial and nodular patterns. EDC.   Basal cell carcinoma 08/17/2020   Right upper antecubital. EDC.   Basal cell carcinoma 08/23/2021   Left Lower Leg, EDC   Kidney stones    MS (multiple sclerosis) (HCC)    UTI (urinary tract infection)    Past Surgical History:  Procedure Laterality Date   BREAST BIOPSY Right 2010   stereotactic biopsy/ neg/ dr.byrnett   BREAST BIOPSY Left 2012   neg/dr. brynett   CESAREAN SECTION  2008   RPH   TONSILLECTOMY  2006   There are no active problems to display for this patient.   ONSET DATE: diagnosed 12 years ago.   REFERRING DIAG: MS, gait and mobility   THERAPY DIAG:  Difficulty in walking, not elsewhere classified  Abnormality of gait and mobility  Unsteadiness on feet  Muscle weakness (generalized)  Rationale for Evaluation and Treatment: Rehabilitation  SUBJECTIVE:                                                                                                                                                                                              SUBJECTIVE STATEMENT:  Patient is on a new medicine for her heart rate and reports she was told she would be dizzy for a few days. Is feeling dizzy.   Pt accompanied by: self  PERTINENT HISTORY: Patient was seen  by physical therapy until September of last year. Pmh includes MS, UTI, anxiety. Has extreme spasticity in LLE. Extreme cold and heat affect her LLE. Patient is now taking hormonal medication. Patient is a Chartered certified accountant.   PAIN:  Are you having pain? No  PRECAUTIONS: None  RED FLAGS: None   WEIGHT BEARING RESTRICTIONS: No  FALLS: Has patient fallen in last 6 months? No  LIVING ENVIRONMENT: Lives with: lives with their family Lives in: House/apartment Stairs: no stairs to enter, lives on second floor Has following equipment at home: Single point cane and shower chair  PLOF: Independent  PATIENT GOALS: continue walking as long as she can.   OBJECTIVE:  Note: Objective measures were completed at Evaluation unless otherwise noted.  DIAGNOSTIC FINDINGS: n/a  COGNITION: Overall cognitive status: Within functional limits for tasks assessed   SENSATION: WFL; slight decrease to RLE   COORDINATION: Heel slide: limited LLE    MUSCLE TONE: LLE: Mild and Moderate   POSTURE: No Significant postural limitations  LOWER EXTREMITY ROM:     WFL  LOWER EXTREMITY MMT:    MMT Right Eval Left Eval  Hip flexion 6.4 4.2  Hip extension    Hip abduction 7.9 3.4  Hip adduction 7.5 8.5  Hip internal rotation    Hip external rotation    Knee flexion 11.1 10.2  Knee extension 7.8 10  Ankle dorsiflexion 10.8 6.8  Ankle plantarflexion 7.2 6.9  Ankle inversion    Ankle eversion    (Blank rows = not tested)  BED MOBILITY:  Scooting into bed is very challenging at end of night  TRANSFERS: Assistive device utilized: Single point cane  Sit to stand: Complete  Independence Stand to sit: Complete Independence Chair to chair: Complete Independence    STAIRS: Level of Assistance: CGA Stair Negotiation Technique: Alternating Pattern  with Single Rail on Right Number of Stairs: 5  Height of Stairs: 6 inch    GAIT: Gait pattern: step through pattern and poor foot clearance- Left Distance walked: 60 ft Assistive device utilized: None Level of assistance: CGA Comments: L foot scuffing  FUNCTIONAL TESTS:  5 times sit to stand: 13 seconds with arms crossed.  6 minute walk test: next session  Functional gait assessment: 14/30  PATIENT SURVEYS:  ABC scale 60.5%                                                                                                                               TREATMENT DATE: 08/22/23  BP: 126/67 HR: 81   TherAct 6" step toe taps 10x each LE 6" step up/down 10x each LE   Dynadisc: LLE -df/pf 20x -eversion/inversion 20x -clockwise 20x/ counterclockwise 20x     Neuro Re-ed: Standing with CGA next to support surface:  Airex pad: static stand 30 seconds x 2 trials, noticeable trembling of ankles/LE's with fatigue and challenge to maintain stability Airex pad: horizontal head turns scanning room 10x ; cueing for  arc of motion  Airex pad: vertical head turns 30 seconds, cueing for arc of motion, noticeable sway with upward gaze increasing demand on ankle righting reaction musculature Airex pad: one foot on 6" step one foot on airex pad, hold position for 30 seconds, switch legs, 2x each LE; Airex pad: 6" step toe taps    PATIENT EDUCATION: Education details: Exercise technique; importance of daily stretching Person educated: Patient Education method: Explanation, Demonstration, Tactile cues, and Verbal cues Education comprehension: verbalized understanding, returned demonstration, verbal cues required, and tactile cues required   HOME EXERCISE PROGRAM: Access Code: [email or web address] URL:  [email or web address] Date: 08/13/2023 Prepared by: Ferrell Hu  Exercises - Hooklying Single Knee to Chest Stretch  - 1-2 x daily - 3 sets - 30 sec  hold - Supine Lower Trunk Rotation  - 1-2 x daily - 3 sets - 30 sec hold - Supine Hamstring Stretch with Strap  - 1-2 x daily - 3 sets - 30 sec hold - Seated Hamstring Stretch  - 1-2 x daily - 3 sets - 30 sec hold - Supine Hip Internal and External Rotation  - 1-2 x daily - 3 sets - 10 reps - Seated Hip External Rotation Stretch  - 1 x daily - 3 sets - 30 sec hold - Supine Figure 4 Piriformis Stretch  - 1 x daily - 3 sets - 30 sec hold       Access Code: [email or web address] URL: [email or web address] Date: 05/29/2023 Prepared by: Shakenya Stoneberg  Exercises - Towel Scrunches  - 1 x daily - 7 x weekly - 2 sets - 10 reps - 5 hold - Toe Spreading  - 1 x daily - 7 x weekly - 2 sets - 10 reps - 5 hold - Seated Ankle Alphabet  - 1 x daily - 7 x weekly - 2 sets - 1 reps - 5 hold  GOALS: Goals reviewed with patient? Yes  SHORT TERM GOALS: Target date: 05/22/2023  Patient will be independent in home exercise program to improve strength/mobility for better functional independence with ADLs. Baseline: 4/14: compliant  Goal status: MET     LONG TERM GOALS: Target date: 10/08/2023    Patient will increase Functional Gait Assessment score to >25/30 as to reduce fall risk and improve dynamic gait safety with community ambulation. Baseline: 1/28: 14/30  4/14: 17/30  Goal status: Partially Met  2.   Patient (< 69 years old) will complete five times sit to stand test in < 10 seconds indicating an increased LE strength and improved balance. Baseline: 1/28: 13 seconds 4/14: 10.72 seconds no hands  Goal status: partially met   3.  Patient will increase six minute walk test distance to >1000 for progression to community ambulator and improve gait ability Baseline: Perform next session 3/4: 820 ft 4/14: 890 ft with SPC   Goal status: Partially Met   4.  Patient will return to nightly ambulation's with less than five foot scuffs per ambulation to return to PLOF.  Baseline: 1/28: limited ambulation 4/14: some limitations pending on day. Encouraged to perform multiple short walks  Goal status: Partially Met   5. Patient will increase ABC scale score >80% to demonstrate better functional mobility and better confidence with ADLs.  Baseline: 1/28: 60%  4/14: 65%  Goal status: Partially Met   ASSESSMENT:  CLINICAL IMPRESSION:    Patient has increased unsteadiness and "wooziness" this session due to new medication. Patient educated on monitoring HR and symptoms . Ankle strengthening tolerated well with  occasional cues for muscle activation techniques. Patient is highly motivated throughout session and eager to progress her mobility.  Pt will continue to benefit from skilled therapy to address remaining deficits in order to improve overall QoL and return to PLOF.      OBJECTIVE IMPAIRMENTS: Abnormal gait, decreased activity tolerance, decreased balance, decreased coordination, decreased endurance, decreased mobility, difficulty walking, decreased strength, impaired perceived functional ability, impaired flexibility, impaired UE functional use, and improper body mechanics.   ACTIVITY LIMITATIONS: carrying, lifting, bending, sitting, standing, squatting, stairs, transfers, bed mobility, dressing, reach over head, hygiene/grooming, locomotion level, and caring for others  PARTICIPATION LIMITATIONS: meal prep, cleaning, laundry, interpersonal relationship, driving, shopping, community activity, and occupation  PERSONAL FACTORS: Age, Fitness, Past/current experiences, Profession, Time since onset of injury/illness/exacerbation, and 1-2 comorbidities: MS anxiety are also affecting patient's functional outcome.   REHAB POTENTIAL: Good  CLINICAL DECISION MAKING: Evolving/moderate complexity  EVALUATION COMPLEXITY:  Moderate  PLAN:  PT FREQUENCY: 2x/week  PT DURATION: 12 weeks  PLANNED INTERVENTIONS: 97164- PT Re-evaluation, 97110-Therapeutic exercises, 97530- Therapeutic activity, 97112- Neuromuscular re-education, 97535- Self Care, 28413- Manual therapy, [phone]- Gait training, [phone]- Orthotic Fit/training, [phone]- Canalith repositioning, Patient/Family education, Balance training, Stair training, Taping, Dry Needling, Joint mobilization, Joint manipulation, Spinal mobilization, Vestibular training, Visual/preceptual remediation/compensation, DME instructions, Cryotherapy, and Moist heat  PLAN FOR NEXT SESSION:  airex pad/balance, LLE strengthening, HEP , LLE coordination. Adding cognitive/manual dual task with dynamic balance activities   Danyle Boening  Brain Cahill, PT, DPT Physical Therapist - [facility]  Outpatient Physical Therapy- Main Campus [phone]      08/22/23, 2:00 PM

## 2023-08-22 ENCOUNTER — Ambulatory Visit: Payer: No Typology Code available for payment source

## 2023-08-22 ENCOUNTER — Other Ambulatory Visit: Payer: Self-pay | Admitting: Internal Medicine

## 2023-08-22 DIAGNOSIS — I10 Essential (primary) hypertension: Secondary | ICD-10-CM

## 2023-08-22 DIAGNOSIS — R2681 Unsteadiness on feet: Secondary | ICD-10-CM

## 2023-08-22 DIAGNOSIS — M6281 Muscle weakness (generalized): Secondary | ICD-10-CM

## 2023-08-22 DIAGNOSIS — R269 Unspecified abnormalities of gait and mobility: Secondary | ICD-10-CM

## 2023-08-22 DIAGNOSIS — R262 Difficulty in walking, not elsewhere classified: Secondary | ICD-10-CM

## 2023-08-22 DIAGNOSIS — I2510 Atherosclerotic heart disease of native coronary artery without angina pectoris: Secondary | ICD-10-CM

## 2023-08-23 NOTE — Therapy (Incomplete)
 OUTPATIENT PHYSICAL THERAPY NEURO TREATMENT     Patient Name: Michelle Ruiz MRN: [id number] DOB:05/28/1970, 53 y.o., female Today's Date: 08/23/2023   PCP: Rex Castor  REFERRING PROVIDER: Devora Folks K  END OF SESSION:                Past Medical History:  Diagnosis Date   Abnormal Pap smear of cervix    Actinic keratosis    Anxiety    Basal cell carcinoma 04/14/2008   Right upper ant. thigh. BCC with sclerosis.   Basal cell carcinoma 08/14/2008   Right lat. lower thigh. Superficial.    Basal cell carcinoma 04/29/2019   Right mid forearm. Superficial and nodular patterns. EDC.   Basal cell carcinoma 08/17/2020   Right upper antecubital. EDC.   Basal cell carcinoma 08/23/2021   Left Lower Leg, EDC   Kidney stones    MS (multiple sclerosis) (HCC)    UTI (urinary tract infection)    Past Surgical History:  Procedure Laterality Date   BREAST BIOPSY Right 2010   stereotactic biopsy/ neg/ dr.byrnett   BREAST BIOPSY Left 2012   neg/dr. brynett   CESAREAN SECTION  2008   RPH   TONSILLECTOMY  2006   There are no active problems to display for this patient.   ONSET DATE: diagnosed 12 years ago.   REFERRING DIAG: MS, gait and mobility   THERAPY DIAG:  No diagnosis found.  Rationale for Evaluation and Treatment: Rehabilitation  SUBJECTIVE:                                                                                                                                                                                             SUBJECTIVE STATEMENT:  ***  Pt accompanied by: self  PERTINENT HISTORY: Patient was seen by physical therapy until September of last year. Pmh includes MS, UTI, anxiety. Has extreme spasticity in LLE. Extreme cold and heat affect her LLE. Patient is now taking hormonal medication. Patient is a Chartered certified accountant.   PAIN:  Are you having pain? No  PRECAUTIONS: None  RED FLAGS: None   WEIGHT BEARING RESTRICTIONS:  No  FALLS: Has patient fallen in last 6 months? No  LIVING ENVIRONMENT: Lives with: lives with their family Lives in: House/apartment Stairs: no stairs to enter, lives on second floor Has following equipment at home: Single point cane and shower chair  PLOF: Independent  PATIENT GOALS: continue walking as long as she can.   OBJECTIVE:  Note: Objective measures were completed at Evaluation unless otherwise noted.  DIAGNOSTIC FINDINGS: n/a  COGNITION: Overall cognitive status: Within functional limits for tasks assessed  SENSATION: WFL; slight decrease to RLE   COORDINATION: Heel slide: limited LLE    MUSCLE TONE: LLE: Mild and Moderate   POSTURE: No Significant postural limitations  LOWER EXTREMITY ROM:     WFL  LOWER EXTREMITY MMT:    MMT Right Eval Left Eval  Hip flexion 6.4 4.2  Hip extension    Hip abduction 7.9 3.4  Hip adduction 7.5 8.5  Hip internal rotation    Hip external rotation    Knee flexion 11.1 10.2  Knee extension 7.8 10  Ankle dorsiflexion 10.8 6.8  Ankle plantarflexion 7.2 6.9  Ankle inversion    Ankle eversion    (Blank rows = not tested)  BED MOBILITY:  Scooting into bed is very challenging at end of night  TRANSFERS: Assistive device utilized: Single point cane  Sit to stand: Complete Independence Stand to sit: Complete Independence Chair to chair: Complete Independence    STAIRS: Level of Assistance: CGA Stair Negotiation Technique: Alternating Pattern  with Single Rail on Right Number of Stairs: 5  Height of Stairs: 6 inch    GAIT: Gait pattern: step through pattern and poor foot clearance- Left Distance walked: 60 ft Assistive device utilized: None Level of assistance: CGA Comments: L foot scuffing  FUNCTIONAL TESTS:  5 times sit to stand: 13 seconds with arms crossed.  6 minute walk test: next session  Functional gait assessment: 14/30  PATIENT SURVEYS:  ABC scale 60.5%                                                                                                                                TREATMENT DATE: 08/23/23  BP: 126/67 HR: 81   TherAct 6" step toe taps 10x each LE 6" step up/down 10x each LE   Dynadisc: LLE -df/pf 20x -eversion/inversion 20x -clockwise 20x/ counterclockwise 20x     Neuro Re-ed: Standing with CGA next to support surface:  Airex pad: static stand 30 seconds x 2 trials, noticeable trembling of ankles/LE's with fatigue and challenge to maintain stability Airex pad: horizontal head turns scanning room 10x ; cueing for arc of motion  Airex pad: vertical head turns 30 seconds, cueing for arc of motion, noticeable sway with upward gaze increasing demand on ankle righting reaction musculature Airex pad: one foot on 6" step one foot on airex pad, hold position for 30 seconds, switch legs, 2x each LE; Airex pad: 6" step toe taps    PATIENT EDUCATION: Education details: Exercise technique; importance of daily stretching Person educated: Patient Education method: Explanation, Demonstration, Tactile cues, and Verbal cues Education comprehension: verbalized understanding, returned demonstration, verbal cues required, and tactile cues required   HOME EXERCISE PROGRAM: Access Code: [email or web address] URL: [email or web address] Date: 08/13/2023 Prepared by: Ferrell Hu  Exercises - Hooklying Single Knee to Chest Stretch  - 1-2 x daily - 3 sets - 30 sec  hold - Supine Lower Trunk Rotation  - 1-2 x daily - 3  sets - 30 sec hold - Supine Hamstring Stretch with Strap  - 1-2 x daily - 3 sets - 30 sec hold - Seated Hamstring Stretch  - 1-2 x daily - 3 sets - 30 sec hold - Supine Hip Internal and External Rotation  - 1-2 x daily - 3 sets - 10 reps - Seated Hip External Rotation Stretch  - 1 x daily - 3 sets - 30 sec hold - Supine Figure 4 Piriformis Stretch  - 1 x daily - 3 sets - 30 sec hold       Access Code: [email or web address] URL:  [email or web address] Date: 05/29/2023 Prepared by: Shakenya Stoneberg  Exercises - Towel Scrunches  - 1 x daily - 7 x weekly - 2 sets - 10 reps - 5 hold - Toe Spreading  - 1 x daily - 7 x weekly - 2 sets - 10 reps - 5 hold - Seated Ankle Alphabet  - 1 x daily - 7 x weekly - 2 sets - 1 reps - 5 hold  GOALS: Goals reviewed with patient? Yes  SHORT TERM GOALS: Target date: 05/22/2023  Patient will be independent in home exercise program to improve strength/mobility for better functional independence with ADLs. Baseline: 4/14: compliant  Goal status: MET     LONG TERM GOALS: Target date: 10/08/2023    Patient will increase Functional Gait Assessment score to >25/30 as to reduce fall risk and improve dynamic gait safety with community ambulation. Baseline: 1/28: 14/30  4/14: 17/30  Goal status: Partially Met  2.   Patient (< 21 years old) will complete five times sit to stand test in < 10 seconds indicating an increased LE strength and improved balance. Baseline: 1/28: 13 seconds 4/14: 10.72 seconds no hands  Goal status: partially met   3.  Patient will increase six minute walk test distance to >1000 for progression to community ambulator and improve gait ability Baseline: Perform next session 3/4: 820 ft 4/14: 890 ft with SPC  Goal status: Partially Met   4.  Patient will return to nightly ambulation's with less than five foot scuffs per ambulation to return to PLOF.  Baseline: 1/28: limited ambulation 4/14: some limitations pending on day. Encouraged to perform multiple short walks  Goal status: Partially Met   5. Patient will increase ABC scale score >80% to demonstrate better functional mobility and better confidence with ADLs.  Baseline: 1/28: 60%  4/14: 65%  Goal status: Partially Met   ASSESSMENT:  CLINICAL IMPRESSION:    *** Pt will continue to benefit from skilled therapy to address remaining deficits in order to improve overall QoL and return to PLOF.       OBJECTIVE IMPAIRMENTS: Abnormal gait, decreased activity tolerance, decreased balance, decreased coordination, decreased endurance, decreased mobility, difficulty walking, decreased strength, impaired perceived functional ability, impaired flexibility, impaired UE functional use, and improper body mechanics.   ACTIVITY LIMITATIONS: carrying, lifting, bending, sitting, standing, squatting, stairs, transfers, bed mobility, dressing, reach over head, hygiene/grooming, locomotion level, and caring for others  PARTICIPATION LIMITATIONS: meal prep, cleaning, laundry, interpersonal relationship, driving, shopping, community activity, and occupation  PERSONAL FACTORS: Age, Fitness, Past/current experiences, Profession, Time since onset of injury/illness/exacerbation, and 1-2 comorbidities: MS anxiety are also affecting patient's functional outcome.   REHAB POTENTIAL: Good  CLINICAL DECISION MAKING: Evolving/moderate complexity  EVALUATION COMPLEXITY: Moderate  PLAN:  PT FREQUENCY: 2x/week  PT DURATION: 12 weeks  PLANNED INTERVENTIONS: 97164- PT Re-evaluation, 97110-Therapeutic exercises, 97530- Therapeutic activity, [id number]- Neuromuscular re-education, 97535- Self Care, 54098-  Manual therapy, [id number]- Gait training, 16109- Orthotic Fit/training, 60454- Canalith repositioning, Patient/Family education, Balance training, Stair training, Taping, Dry Needling, Joint mobilization, Joint manipulation, Spinal mobilization, Vestibular training, Visual/preceptual remediation/compensation, DME instructions, Cryotherapy, and Moist heat  PLAN FOR NEXT SESSION:  airex pad/balance, LLE strengthening, HEP , LLE coordination. Adding cognitive/manual dual task with dynamic balance activities   Augustina Braddock  Brain Cahill, PT, DPT Physical Therapist - [facility]  Outpatient Physical Therapy- [facility] [phone]      08/23/23, 4:27 PM

## 2023-08-27 ENCOUNTER — Ambulatory Visit: Payer: No Typology Code available for payment source

## 2023-08-28 NOTE — Therapy (Incomplete)
 OUTPATIENT PHYSICAL THERAPY NEURO TREATMENT     Patient Name: Michelle Ruiz MRN: [id number] DOB:06/13/1970, 53 y.o., female Today's Date: 08/28/2023   PCP: Rex Castor  REFERRING PROVIDER: Devora Folks K  END OF SESSION:                Past Medical History:  Diagnosis Date   Abnormal Pap smear of cervix    Actinic keratosis    Anxiety    Basal cell carcinoma 04/14/2008   Right upper ant. thigh. BCC with sclerosis.   Basal cell carcinoma 08/14/2008   Right lat. lower thigh. Superficial.    Basal cell carcinoma 04/29/2019   Right mid forearm. Superficial and nodular patterns. EDC.   Basal cell carcinoma 08/17/2020   Right upper antecubital. EDC.   Basal cell carcinoma 08/23/2021   Left Lower Leg, EDC   Kidney stones    MS (multiple sclerosis) (HCC)    UTI (urinary tract infection)    Past Surgical History:  Procedure Laterality Date   BREAST BIOPSY Right 2010   stereotactic biopsy/ neg/ dr.byrnett   BREAST BIOPSY Left 2012   neg/dr. brynett   CESAREAN SECTION  2008   RPH   TONSILLECTOMY  2006   There are no active problems to display for this patient.   ONSET DATE: diagnosed 12 years ago.   REFERRING DIAG: MS, gait and mobility   THERAPY DIAG:  No diagnosis found.  Rationale for Evaluation and Treatment: Rehabilitation  SUBJECTIVE:                                                                                                                                                                                             SUBJECTIVE STATEMENT:  ***  Pt accompanied by: self  PERTINENT HISTORY: Patient was seen by physical therapy until September of last year. Pmh includes MS, UTI, anxiety. Has extreme spasticity in LLE. Extreme cold and heat affect her LLE. Patient is now taking hormonal medication. Patient is a Chartered certified accountant.   PAIN:  Are you having pain? No  PRECAUTIONS: None  RED FLAGS: None   WEIGHT BEARING RESTRICTIONS:  No  FALLS: Has patient fallen in last 6 months? No  LIVING ENVIRONMENT: Lives with: lives with their family Lives in: House/apartment Stairs: no stairs to enter, lives on second floor Has following equipment at home: Single point cane and shower chair  PLOF: Independent  PATIENT GOALS: continue walking as long as she can.   OBJECTIVE:  Note: Objective measures were completed at Evaluation unless otherwise noted.  DIAGNOSTIC FINDINGS: n/a  COGNITION: Overall cognitive status: Within functional limits for tasks assessed  SENSATION: WFL; slight decrease to RLE   COORDINATION: Heel slide: limited LLE    MUSCLE TONE: LLE: Mild and Moderate   POSTURE: No Significant postural limitations  LOWER EXTREMITY ROM:     WFL  LOWER EXTREMITY MMT:    MMT Right Eval Left Eval  Hip flexion 6.4 4.2  Hip extension    Hip abduction 7.9 3.4  Hip adduction 7.5 8.5  Hip internal rotation    Hip external rotation    Knee flexion 11.1 10.2  Knee extension 7.8 10  Ankle dorsiflexion 10.8 6.8  Ankle plantarflexion 7.2 6.9  Ankle inversion    Ankle eversion    (Blank rows = not tested)  BED MOBILITY:  Scooting into bed is very challenging at end of night  TRANSFERS: Assistive device utilized: Single point cane  Sit to stand: Complete Independence Stand to sit: Complete Independence Chair to chair: Complete Independence    STAIRS: Level of Assistance: CGA Stair Negotiation Technique: Alternating Pattern  with Single Rail on Right Number of Stairs: 5  Height of Stairs: 6 inch    GAIT: Gait pattern: step through pattern and poor foot clearance- Left Distance walked: 60 ft Assistive device utilized: None Level of assistance: CGA Comments: L foot scuffing  FUNCTIONAL TESTS:  5 times sit to stand: 13 seconds with arms crossed.  6 minute walk test: next session  Functional gait assessment: 14/30  PATIENT SURVEYS:  ABC scale 60.5%                                                                                                                                TREATMENT DATE: 08/28/23  BP: 126/67 HR: 81   TherAct 6" step toe taps 10x each LE 6" step up/down 10x each LE   Dynadisc: LLE -df/pf 20x -eversion/inversion 20x -clockwise 20x/ counterclockwise 20x     Neuro Re-ed: Standing with CGA next to support surface:  Airex pad: static stand 30 seconds x 2 trials, noticeable trembling of ankles/LE's with fatigue and challenge to maintain stability Airex pad: horizontal head turns scanning room 10x ; cueing for arc of motion  Airex pad: vertical head turns 30 seconds, cueing for arc of motion, noticeable sway with upward gaze increasing demand on ankle righting reaction musculature Airex pad: one foot on 6" step one foot on airex pad, hold position for 30 seconds, switch legs, 2x each LE; Airex pad: 6" step toe taps    PATIENT EDUCATION: Education details: Exercise technique; importance of daily stretching Person educated: Patient Education method: Explanation, Demonstration, Tactile cues, and Verbal cues Education comprehension: verbalized understanding, returned demonstration, verbal cues required, and tactile cues required   HOME EXERCISE PROGRAM: Access Code: [email or web address] URL: [email or web address] Date: 08/13/2023 Prepared by: Ferrell Hu  Exercises - Hooklying Single Knee to Chest Stretch  - 1-2 x daily - 3 sets - 30 sec  hold - Supine Lower Trunk Rotation  - 1-2 x daily - 3  sets - 30 sec hold - Supine Hamstring Stretch with Strap  - 1-2 x daily - 3 sets - 30 sec hold - Seated Hamstring Stretch  - 1-2 x daily - 3 sets - 30 sec hold - Supine Hip Internal and External Rotation  - 1-2 x daily - 3 sets - 10 reps - Seated Hip External Rotation Stretch  - 1 x daily - 3 sets - 30 sec hold - Supine Figure 4 Piriformis Stretch  - 1 x daily - 3 sets - 30 sec hold       Access Code: [email or web address] URL:  [email or web address] Date: 05/29/2023 Prepared by: Brayah Urquilla  Exercises - Towel Scrunches  - 1 x daily - 7 x weekly - 2 sets - 10 reps - 5 hold - Toe Spreading  - 1 x daily - 7 x weekly - 2 sets - 10 reps - 5 hold - Seated Ankle Alphabet  - 1 x daily - 7 x weekly - 2 sets - 1 reps - 5 hold  GOALS: Goals reviewed with patient? Yes  SHORT TERM GOALS: Target date: 05/22/2023  Patient will be independent in home exercise program to improve strength/mobility for better functional independence with ADLs. Baseline: 4/14: compliant  Goal status: MET     LONG TERM GOALS: Target date: 10/08/2023    Patient will increase Functional Gait Assessment score to >25/30 as to reduce fall risk and improve dynamic gait safety with community ambulation. Baseline: 1/28: 14/30  4/14: 17/30  Goal status: Partially Met  2.   Patient (< 61 years old) will complete five times sit to stand test in < 10 seconds indicating an increased LE strength and improved balance. Baseline: 1/28: 13 seconds 4/14: 10.72 seconds no hands  Goal status: partially met   3.  Patient will increase six minute walk test distance to >1000 for progression to community ambulator and improve gait ability Baseline: Perform next session 3/4: 820 ft 4/14: 890 ft with SPC  Goal status: Partially Met   4.  Patient will return to nightly ambulation's with less than five foot scuffs per ambulation to return to PLOF.  Baseline: 1/28: limited ambulation 4/14: some limitations pending on day. Encouraged to perform multiple short walks  Goal status: Partially Met   5. Patient will increase ABC scale score >80% to demonstrate better functional mobility and better confidence with ADLs.  Baseline: 1/28: 60%  4/14: 65%  Goal status: Partially Met   ASSESSMENT:  CLINICAL IMPRESSION:    *** Pt will continue to benefit from skilled therapy to address remaining deficits in order to improve overall QoL and return to PLOF.       OBJECTIVE IMPAIRMENTS: Abnormal gait, decreased activity tolerance, decreased balance, decreased coordination, decreased endurance, decreased mobility, difficulty walking, decreased strength, impaired perceived functional ability, impaired flexibility, impaired UE functional use, and improper body mechanics.   ACTIVITY LIMITATIONS: carrying, lifting, bending, sitting, standing, squatting, stairs, transfers, bed mobility, dressing, reach over head, hygiene/grooming, locomotion level, and caring for others  PARTICIPATION LIMITATIONS: meal prep, cleaning, laundry, interpersonal relationship, driving, shopping, community activity, and occupation  PERSONAL FACTORS: Age, Fitness, Past/current experiences, Profession, Time since onset of injury/illness/exacerbation, and 1-2 comorbidities: MS anxiety are also affecting patient's functional outcome.   REHAB POTENTIAL: Good  CLINICAL DECISION MAKING: Evolving/moderate complexity  EVALUATION COMPLEXITY: Moderate  PLAN:  PT FREQUENCY: 2x/week  PT DURATION: 12 weeks  PLANNED INTERVENTIONS: 97164- PT Re-evaluation, 97110-Therapeutic exercises, 97530- Therapeutic activity, [id number]- Neuromuscular re-education, 97535- Self Care, 54098-  Manual therapy, [id number]- Gait training, 40981- Orthotic Fit/training, 19147- Canalith repositioning, Patient/Family education, Balance training, Stair training, Taping, Dry Needling, Joint mobilization, Joint manipulation, Spinal mobilization, Vestibular training, Visual/preceptual remediation/compensation, DME instructions, Cryotherapy, and Moist heat  PLAN FOR NEXT SESSION:  airex pad/balance, LLE strengthening, HEP , LLE coordination. Adding cognitive/manual dual task with dynamic balance activities   Jaquane Boughner  Brain Cahill, PT, DPT Physical Therapist - [facility]  Outpatient Physical Therapy- [facility] [phone]      08/28/23, 3:28 PM

## 2023-08-29 ENCOUNTER — Ambulatory Visit: Payer: No Typology Code available for payment source

## 2023-08-30 ENCOUNTER — Ambulatory Visit
Admission: RE | Admit: 2023-08-30 | Discharge: 2023-08-30 | Disposition: A | Discharge status: Home / Self Care | Payer: Self-pay | Source: Ambulatory Visit | Attending: Internal Medicine | Admitting: Internal Medicine

## 2023-08-30 DIAGNOSIS — I10 Essential (primary) hypertension: Secondary | ICD-10-CM | POA: Insufficient documentation

## 2023-08-30 DIAGNOSIS — I2510 Atherosclerotic heart disease of native coronary artery without angina pectoris: Secondary | ICD-10-CM | POA: Insufficient documentation

## 2023-09-03 ENCOUNTER — Ambulatory Visit: Payer: No Typology Code available for payment source | Attending: Neurology

## 2023-09-03 DIAGNOSIS — R2681 Unsteadiness on feet: Secondary | ICD-10-CM | POA: Diagnosis present

## 2023-09-03 DIAGNOSIS — R269 Unspecified abnormalities of gait and mobility: Secondary | ICD-10-CM | POA: Diagnosis present

## 2023-09-03 DIAGNOSIS — M6281 Muscle weakness (generalized): Secondary | ICD-10-CM | POA: Insufficient documentation

## 2023-09-03 DIAGNOSIS — R262 Difficulty in walking, not elsewhere classified: Secondary | ICD-10-CM | POA: Diagnosis present

## 2023-09-03 NOTE — Therapy (Signed)
 OUTPATIENT PHYSICAL THERAPY NEURO TREATMENT     Patient Name: Michelle Ruiz MRN: [id number] DOB:04-04-1970, 53 y.o., female Today's Date: 09/03/2023   PCP: Rex Castor  REFERRING PROVIDER: Devora Folks K  END OF SESSION:  PT End of Session - 09/03/23 1310     Visit Number 19    Number of Visits 36    Date for PT Re-Evaluation 10/08/23    Progress Note Due on Visit 70    PT Start Time 1315    PT Stop Time 1355    PT Time Calculation (min) 40 min    Equipment Utilized During Treatment Gait belt    Activity Tolerance Patient tolerated treatment well    Behavior During Therapy WFL for tasks assessed/performed                          Past Medical History:  Diagnosis Date   Abnormal Pap smear of cervix    Actinic keratosis    Anxiety    Basal cell carcinoma 04/14/2008   Right upper ant. thigh. BCC with sclerosis.   Basal cell carcinoma 08/14/2008   Right lat. lower thigh. Superficial.    Basal cell carcinoma 04/29/2019   Right mid forearm. Superficial and nodular patterns. EDC.   Basal cell carcinoma 08/17/2020   Right upper antecubital. EDC.   Basal cell carcinoma 08/23/2021   Left Lower Leg, EDC   Kidney stones    MS (multiple sclerosis) (HCC)    UTI (urinary tract infection)    Past Surgical History:  Procedure Laterality Date   BREAST BIOPSY Right 2010   stereotactic biopsy/ neg/ dr.byrnett   BREAST BIOPSY Left 2012   neg/dr. brynett   CESAREAN SECTION  2008   [facility]   TONSILLECTOMY  2006   There are no active problems to display for this patient.   ONSET DATE: diagnosed 12 years ago.   REFERRING DIAG: MS, gait and mobility   THERAPY DIAG:  Difficulty in walking, not elsewhere classified  Abnormality of gait and mobility  Unsteadiness on feet  Muscle weakness (generalized)  Rationale for Evaluation and Treatment: Rehabilitation  SUBJECTIVE:                                                                                                                                                                                              SUBJECTIVE STATEMENT:  Pt reports that she tried to go on a walk last night and she got down the road and feared she would not be able to make it back due to hip pain, however she states that  she has felt fine today. -missed last session due to her dog passing.   Pt accompanied by: self  PERTINENT HISTORY: Patient was seen by physical therapy until September of last year. Pmh includes MS, UTI, anxiety. Has extreme spasticity in LLE. Extreme cold and heat affect her LLE. Patient is now taking hormonal medication. Patient is a Chartered certified accountant.   PAIN:  Are you having pain? No  PRECAUTIONS: None  RED FLAGS: None   WEIGHT BEARING RESTRICTIONS: No  FALLS: Has patient fallen in last 6 months? No  LIVING ENVIRONMENT: Lives with: lives with their family Lives in: House/apartment Stairs: no stairs to enter, lives on second floor Has following equipment at home: Single point cane and shower chair  PLOF: Independent  PATIENT GOALS: continue walking as long as she can.   OBJECTIVE:  Note: Objective measures were completed at Evaluation unless otherwise noted.  DIAGNOSTIC FINDINGS: n/a  COGNITION: Overall cognitive status: Within functional limits for tasks assessed   SENSATION: WFL; slight decrease to RLE   COORDINATION: Heel slide: limited LLE    MUSCLE TONE: LLE: Mild and Moderate   POSTURE: No Significant postural limitations  LOWER EXTREMITY ROM:     WFL  LOWER EXTREMITY MMT:    MMT Right Eval Left Eval  Hip flexion 6.4 4.2  Hip extension    Hip abduction 7.9 3.4  Hip adduction 7.5 8.5  Hip internal rotation    Hip external rotation    Knee flexion 11.1 10.2  Knee extension 7.8 10  Ankle dorsiflexion 10.8 6.8  Ankle plantarflexion 7.2 6.9  Ankle inversion    Ankle eversion    (Blank rows = not tested)  BED MOBILITY:  Scooting into bed is very  challenging at end of night  TRANSFERS: Assistive device utilized: Single point cane  Sit to stand: Complete Independence Stand to sit: Complete Independence Chair to chair: Complete Independence    STAIRS: Level of Assistance: CGA Stair Negotiation Technique: Alternating Pattern  with Single Rail on Right Number of Stairs: 5  Height of Stairs: 6 inch    GAIT: Gait pattern: step through pattern and poor foot clearance- Left Distance walked: 60 ft Assistive device utilized: None Level of assistance: CGA Comments: L foot scuffing  FUNCTIONAL TESTS:  5 times sit to stand: 13 seconds with arms crossed.  6 minute walk test: next session  Functional gait assessment: 14/30  PATIENT SURVEYS:  ABC scale 60.5%                                                                                                                               TREATMENT DATE: 09/03/23   TherEx: Mini-squat with BUE support on bar x10 RTB seated HS curls x10 each LE; cues for eccentric control of L knee extension RTB resisted plantarflexion x10 LLE Standing calf raises with UE support x10 RTB seated lateral step-outs x10 each LE; band tied around ankles Seated lateral hedgehog step-overs x5  each LE  Neuro Re-ed: Standing with CGA next to support surface: Static SLS 30 seconds x 3 trials each LE  Airex pad: static stand 30 seconds x 2 trials; minor trembling visible in ankles R>L Airex pad: horizontal head turns scanning room x10 ; cueing for arc of motion, more noticeable trembling of bil ankles Airex pad: vertical head turns x10; more noticeable trembling of bil ankles Airex pad: hedgehog toe taps forward x10 each LE; cues to limit pulling hedgehog closer Airex pad: hedgehog toe taps lateral x10 each LE; more challenging with R LE, pt utilizing UE reactionary support at times with R toe taps   PATIENT EDUCATION: Education details: Exercise technique; importance of daily stretching Person educated:  Patient Education method: Explanation, Demonstration, Tactile cues, and Verbal cues Education comprehension: verbalized understanding, returned demonstration, verbal cues required, and tactile cues required   HOME EXERCISE PROGRAM: Access Code: [email or web address] URL: [email or web address] Date: 08/13/2023 Prepared by: Ferrell Hu  Exercises - Hooklying Single Knee to Chest Stretch  - 1-2 x daily - 3 sets - 30 sec  hold - Supine Lower Trunk Rotation  - 1-2 x daily - 3 sets - 30 sec hold - Supine Hamstring Stretch with Strap  - 1-2 x daily - 3 sets - 30 sec hold - Seated Hamstring Stretch  - 1-2 x daily - 3 sets - 30 sec hold - Supine Hip Internal and External Rotation  - 1-2 x daily - 3 sets - 10 reps - Seated Hip External Rotation Stretch  - 1 x daily - 3 sets - 30 sec hold - Supine Figure 4 Piriformis Stretch  - 1 x daily - 3 sets - 30 sec hold       Access Code: [email or web address] URL: [email or web address] Date: 05/29/2023 Prepared by: Marina  Moser  Exercises - Towel Scrunches  - 1 x daily - 7 x weekly - 2 sets - 10 reps - 5 hold - Toe Spreading  - 1 x daily - 7 x weekly - 2 sets - 10 reps - 5 hold - Seated Ankle Alphabet  - 1 x daily - 7 x weekly - 2 sets - 1 reps - 5 hold  GOALS: Goals reviewed with patient? Yes  SHORT TERM GOALS: Target date: 05/22/2023  Patient will be independent in home exercise program to improve strength/mobility for better functional independence with ADLs. Baseline: 4/14: compliant  Goal status: MET     LONG TERM GOALS: Target date: 10/08/2023    Patient will increase Functional Gait Assessment score to >25/30 as to reduce fall risk and improve dynamic gait safety with community ambulation. Baseline: 1/28: 14/30  4/14: 17/30  Goal status: Partially Met  2.   Patient (< 25 years old) will complete five times sit to stand test in < 10 seconds indicating an increased LE strength and improved balance. Baseline: 1/28: 13  seconds 4/14: 10.72 seconds no hands  Goal status: partially met   3.  Patient will increase six minute walk test distance to >1000 for progression to community ambulator and improve gait ability Baseline: Perform next session 3/4: 820 ft 4/14: 890 ft with SPC  Goal status: Partially Met   4.  Patient will return to nightly ambulation's with less than five foot scuffs per ambulation to return to PLOF.  Baseline: 1/28: limited ambulation 4/14: some limitations pending on day. Encouraged to perform multiple short walks  Goal status: Partially Met   5. Patient will increase ABC scale score >80% to demonstrate better functional  mobility and better confidence with ADLs.  Baseline: 1/28: 60%  4/14: 65%  Goal status: Partially Met   ASSESSMENT:  CLINICAL IMPRESSION:    Pt demonstrated improvements with single-leg dynamic balance today and required less UE support. Pt's strength deficits in L LE are more noticeable with increased fatigue during session. Pt responded well to progressive ankle strengthening exercises and dynamic balance with noticeable activation of ankle reaction strategy throughout. Pt will continue to benefit from skilled therapy to address remaining deficits in order to improve overall QoL and return to PLOF.      OBJECTIVE IMPAIRMENTS: Abnormal gait, decreased activity tolerance, decreased balance, decreased coordination, decreased endurance, decreased mobility, difficulty walking, decreased strength, impaired perceived functional ability, impaired flexibility, impaired UE functional use, and improper body mechanics.   ACTIVITY LIMITATIONS: carrying, lifting, bending, sitting, standing, squatting, stairs, transfers, bed mobility, dressing, reach over head, hygiene/grooming, locomotion level, and caring for others  PARTICIPATION LIMITATIONS: meal prep, cleaning, laundry, interpersonal relationship, driving, shopping, community activity, and occupation  PERSONAL FACTORS: Age,  Fitness, Past/current experiences, Profession, Time since onset of injury/illness/exacerbation, and 1-2 comorbidities: MS anxiety are also affecting patient's functional outcome.   REHAB POTENTIAL: Good  CLINICAL DECISION MAKING: Evolving/moderate complexity  EVALUATION COMPLEXITY: Moderate  PLAN:  PT FREQUENCY: 2x/week  PT DURATION: 12 weeks  PLANNED INTERVENTIONS: 97164- PT Re-evaluation, 97110-Therapeutic exercises, 97530- Therapeutic activity, 97112- Neuromuscular re-education, 97535- Self Care, 86578- Manual therapy, [phone]- Gait training, [phone]- Orthotic Fit/training, [phone]- Canalith repositioning, Patient/Family education, Balance training, Stair training, Taping, Dry Needling, Joint mobilization, Joint manipulation, Spinal mobilization, Vestibular training, Visual/preceptual remediation/compensation, DME instructions, Cryotherapy, and Moist heat  PLAN FOR NEXT SESSION:  airex pad/balance, LLE strengthening, HEP , LLE coordination. Adding cognitive/manual dual task with dynamic balance activities   Marina  Brain Cahill, PT, DPT Physical Therapist - [facility]  Outpatient Physical Therapy- [facility] [phone]     This entire session was performed under direct supervision and direction of a licensed therapist/therapist assistant . I have personally read, edited and approve of the note as written.   Elodia Haviland, SPT  09/03/23, 4:29 PM

## 2023-09-04 NOTE — Therapy (Signed)
 OUTPATIENT PHYSICAL THERAPY NEURO TREATMENT/ Physical Therapy Progress Note   Dates of reporting period  07/09/23   to   09/05/23       Patient Name: Michelle Ruiz MRN: [id number] DOB:1970-11-09, 53 y.o., female Today's Date: 09/05/2023   PCP: Rex Castor  REFERRING PROVIDER: Devora Folks K  END OF SESSION:  PT End of Session - 09/05/23 1311     Visit Number 20    Number of Visits 36    Date for PT Re-Evaluation 10/08/23    Progress Note Due on Visit 70    PT Start Time 1315    PT Stop Time 1359    PT Time Calculation (min) 44 min    Equipment Utilized During Treatment Gait belt    Activity Tolerance Patient tolerated treatment well    Behavior During Therapy WFL for tasks assessed/performed                           Past Medical History:  Diagnosis Date   Abnormal Pap smear of cervix    Actinic keratosis    Anxiety    Basal cell carcinoma 04/14/2008   Right upper ant. thigh. BCC with sclerosis.   Basal cell carcinoma 08/14/2008   Right lat. lower thigh. Superficial.    Basal cell carcinoma 04/29/2019   Right mid forearm. Superficial and nodular patterns. EDC.   Basal cell carcinoma 08/17/2020   Right upper antecubital. EDC.   Basal cell carcinoma 08/23/2021   Left Lower Leg, EDC   Kidney stones    MS (multiple sclerosis) (HCC)    UTI (urinary tract infection)    Past Surgical History:  Procedure Laterality Date   BREAST BIOPSY Right 2010   stereotactic biopsy/ neg/ dr.byrnett   BREAST BIOPSY Left 2012   neg/dr. brynett   CESAREAN SECTION  2008   RPH   TONSILLECTOMY  2006   There are no active problems to display for this patient.   ONSET DATE: diagnosed 12 years ago.   REFERRING DIAG: MS, gait and mobility   THERAPY DIAG:  Difficulty in walking, not elsewhere classified  Abnormality of gait and mobility  Unsteadiness on feet  Muscle weakness (generalized)  Rationale for Evaluation and Treatment:  Rehabilitation  SUBJECTIVE:                                                                                                                                                                                             SUBJECTIVE STATEMENT:  Patient reports it is hot today. Reports in general she is able to walk without scuffing  her foot as much. Patient reports slow progress in her distance but progress.   Pt accompanied by: self  PERTINENT HISTORY: Patient was seen by physical therapy until September of last year. Pmh includes MS, UTI, anxiety. Has extreme spasticity in LLE. Extreme cold and heat affect her LLE. Patient is now taking hormonal medication. Patient is a Chartered certified accountant.   PAIN:  Are you having pain? No  PRECAUTIONS: None  RED FLAGS: None   WEIGHT BEARING RESTRICTIONS: No  FALLS: Has patient fallen in last 6 months? No  LIVING ENVIRONMENT: Lives with: lives with their family Lives in: House/apartment Stairs: no stairs to enter, lives on second floor Has following equipment at home: Single point cane and shower chair  PLOF: Independent  PATIENT GOALS: continue walking as long as she can.   OBJECTIVE:  Note: Objective measures were completed at Evaluation unless otherwise noted.  DIAGNOSTIC FINDINGS: n/a  COGNITION: Overall cognitive status: Within functional limits for tasks assessed   SENSATION: WFL; slight decrease to RLE   COORDINATION: Heel slide: limited LLE    MUSCLE TONE: LLE: Mild and Moderate   POSTURE: No Significant postural limitations  LOWER EXTREMITY ROM:     WFL  LOWER EXTREMITY MMT:    MMT Right Eval Left Eval  Hip flexion 6.4 4.2  Hip extension    Hip abduction 7.9 3.4  Hip adduction 7.5 8.5  Hip internal rotation    Hip external rotation    Knee flexion 11.1 10.2  Knee extension 7.8 10  Ankle dorsiflexion 10.8 6.8  Ankle plantarflexion 7.2 6.9  Ankle inversion    Ankle eversion    (Blank rows = not  tested)  BED MOBILITY:  Scooting into bed is very challenging at end of night  TRANSFERS: Assistive device utilized: Single point cane  Sit to stand: Complete Independence Stand to sit: Complete Independence Chair to chair: Complete Independence    STAIRS: Level of Assistance: CGA Stair Negotiation Technique: Alternating Pattern  with Single Rail on Right Number of Stairs: 5  Height of Stairs: 6 inch    GAIT: Gait pattern: step through pattern and poor foot clearance- Left Distance walked: 60 ft Assistive device utilized: None Level of assistance: CGA Comments: L foot scuffing  FUNCTIONAL TESTS:  5 times sit to stand: 13 seconds with arms crossed.  6 minute walk test: next session  Functional gait assessment: 14/30  PATIENT SURVEYS:  ABC scale 60.5%                                                                                                                               TREATMENT DATE: 09/05/23  Physical therapy treatment session today consisted of completing assessment of goals and administration of testing as demonstrated and documented in flow sheet, treatment, and goals section of this note. Addition treatments may be found below.    [facility] PT Assessment - 09/05/23 0001       Functional Gait  Assessment   Gait Level Surface Walks 20 ft in less than 7 sec but greater than 5.5 sec, uses assistive device, slower speed, mild gait deviations, or deviates 6-10 in outside of the 12 in walkway width.    Change in Gait Speed Able to change speed, demonstrates mild gait deviations, deviates 6-10 in outside of the 12 in walkway width, or no gait deviations, unable to achieve a major change in velocity, or uses a change in velocity, or uses an assistive device.    Gait with Horizontal Head Turns Performs head turns smoothly with slight change in gait velocity (eg, minor disruption to smooth gait path), deviates 6-10 in outside 12 in walkway width, or uses an assistive device.     Gait with Vertical Head Turns Performs task with slight change in gait velocity (eg, minor disruption to smooth gait path), deviates 6 - 10 in outside 12 in walkway width or uses assistive device    Gait and Pivot Turn Pivot turns safely in greater than 3 sec and stops with no loss of balance, or pivot turns safely within 3 sec and stops with mild imbalance, requires small steps to catch balance.    Step Over Obstacle Is able to step over one shoe box (4.5 in total height) but must slow down and adjust steps to clear box safely. May require verbal cueing.    Gait with Narrow Base of Support Ambulates 4-7 steps.    Gait with Eyes Closed Walks 20 ft, uses assistive device, slower speed, mild gait deviations, deviates 6-10 in outside 12 in walkway width. Ambulates 20 ft in less than 9 sec but greater than 7 sec.    Ambulating Backwards Walks 20 ft, uses assistive device, slower speed, mild gait deviations, deviates 6-10 in outside 12 in walkway width.    Steps Alternating feet, must use rail.    Total Score 18             6 Min Walk Test:  Instructed patient to ambulate as quickly and as safely as possible for 6 minutes using LRAD. Patient was allowed to take standing rest breaks without stopping the test, but if the patient required a sitting rest break the clock would be stopped and the test would be over.  Results: 888 feet using a SPC with CGA. Results indicate that the patient has reduced endurance with ambulation compared to age matched norms.  Age Matched Norms: 63-69 yo M: 67 F: 50, 21-79 yo M: 72 F: 471, 48-89 yo M: 417 F: 392 MDC: 58.21 meters (190.98 feet) or 50 meters (ANPTA Core Set of Outcome Measures for Adults with Neurologic Conditions, 2018)  Pt performed 5 time sit<>stand (5xSTS): 10 sec (>15 sec indicates increased fall risk)     TherEx: Seated: 3lb AW -march 10x each LE; 2 sets -LAQ 10 each LE; 2 sets   Mini-squat with BUE support on bar x10 RTB seated HS curls  x10 each LE; cues for eccentric control of L knee extension RTB resisted plantarflexion x10 LLE Standing calf raises with UE support x10 RTB seated lateral step-outs x10 each LE; band tied around ankles Seated lateral hedgehog step-overs x5 each LE    PATIENT EDUCATION: Education details: Exercise technique; importance of daily stretching Person educated: Patient Education method: Explanation, Demonstration, Tactile cues, and Verbal cues Education comprehension: verbalized understanding, returned demonstration, verbal cues required, and tactile cues required   HOME EXERCISE PROGRAM: Access Code: [email or web address] URL: [email or web address] Date: 08/13/2023 Prepared by: Ferrell Hu  Exercises - Hooklying Single Knee to Chest Stretch  - 1-2 x daily - 3 sets - 30 sec  hold - Supine Lower Trunk Rotation  - 1-2 x daily - 3 sets - 30 sec hold - Supine Hamstring Stretch with Strap  - 1-2 x daily - 3 sets - 30 sec hold - Seated Hamstring Stretch  - 1-2 x daily - 3 sets - 30 sec hold - Supine Hip Internal and External Rotation  - 1-2 x daily - 3 sets - 10 reps - Seated Hip External Rotation Stretch  - 1 x daily - 3 sets - 30 sec hold - Supine Figure 4 Piriformis Stretch  - 1 x daily - 3 sets - 30 sec hold       Access Code: [email or web address] URL: [email or web address] Date: 05/29/2023 Prepared by: Ripley Lovecchio  Exercises - Towel Scrunches  - 1 x daily - 7 x weekly - 2 sets - 10 reps - 5 hold - Toe Spreading  - 1 x daily - 7 x weekly - 2 sets - 10 reps - 5 hold - Seated Ankle Alphabet  - 1 x daily - 7 x weekly - 2 sets - 1 reps - 5 hold  GOALS: Goals reviewed with patient? Yes  SHORT TERM GOALS: Target date: 05/22/2023  Patient will be independent in home exercise program to improve strength/mobility for better functional independence with ADLs. Baseline: 4/14: compliant  Goal status: MET     LONG TERM GOALS: Target date: 10/08/2023    Patient will  increase Functional Gait Assessment score to >25/30 as to reduce fall risk and improve dynamic gait safety with community ambulation. Baseline: 1/28: 14/30  4/14: 17/30  6/11: 18/30 Goal status: Partially Met  2.   Patient (< 34 years old) will complete five times sit to stand test in < 10 seconds indicating an increased LE strength and improved balance. Baseline: 1/28: 13 seconds 4/14: 10.72 seconds no hands 6/11: 10 seconds no hands  Goal status: MET  3.  Patient will increase six minute walk test distance to >1000 for progression to community ambulator and improve gait ability Baseline: Perform next session 3/4: 820 ft 4/14: 890 ft with [facility]  6/11: 888 ft with SPC Goal status: Partially Met   4.  Patient will return to nightly ambulation's with less than five foot scuffs per ambulation to return to PLOF.  Baseline: 1/28: limited ambulation 4/14: some limitations pending on day. Encouraged to perform multiple short walks 6/11: started doing nightly walks ; decreased foot scuffs  Goal status: Partially Met   5. Patient will increase ABC scale score >80% to demonstrate better functional mobility and better confidence with ADLs.  Baseline: 1/28: 60%  4/14: 65%  6/11: 68%  Goal status: Partially Met   ASSESSMENT:  CLINICAL IMPRESSION:    Patient's condition has the potential to improve in response to therapy. Maximum improvement is yet to be obtained. The anticipated improvement is attainable and reasonable in a generally predictable time. Patient shows excellent progress towards functional goals, meeting her 5x STS goal this session. She progressed her FGA and her ambulation with decreased foot scuff goal demonstrating carryover between sessions.  Pt will continue to benefit from skilled therapy to address remaining deficits in order to improve overall QoL and return to PLOF.      OBJECTIVE IMPAIRMENTS: Abnormal gait, decreased activity tolerance, decreased balance, decreased coordination,  decreased endurance, decreased mobility, difficulty walking, decreased strength, impaired perceived functional ability, impaired flexibility, impaired  UE functional use, and improper body mechanics.   ACTIVITY LIMITATIONS: carrying, lifting, bending, sitting, standing, squatting, stairs, transfers, bed mobility, dressing, reach over head, hygiene/grooming, locomotion level, and caring for others  PARTICIPATION LIMITATIONS: meal prep, cleaning, laundry, interpersonal relationship, driving, shopping, community activity, and occupation  PERSONAL FACTORS: Age, Fitness, Past/current experiences, Profession, Time since onset of injury/illness/exacerbation, and 1-2 comorbidities: MS anxiety are also affecting patient's functional outcome.   REHAB POTENTIAL: Good  CLINICAL DECISION MAKING: Evolving/moderate complexity  EVALUATION COMPLEXITY: Moderate  PLAN:  PT FREQUENCY: 2x/week  PT DURATION: 12 weeks  PLANNED INTERVENTIONS: 97164- PT Re-evaluation, 97110-Therapeutic exercises, 97530- Therapeutic activity, 97112- Neuromuscular re-education, 97535- Self Care, 82956- Manual therapy, [phone]- Gait training, [phone]- Orthotic Fit/training, [phone]- Canalith repositioning, Patient/Family education, Balance training, Stair training, Taping, Dry Needling, Joint mobilization, Joint manipulation, Spinal mobilization, Vestibular training, Visual/preceptual remediation/compensation, DME instructions, Cryotherapy, and Moist heat  PLAN FOR NEXT SESSION:  airex pad/balance, LLE strengthening, HEP , LLE coordination. Adding cognitive/manual dual task with dynamic balance activities   Anastasios Melander  Brain Cahill, PT, DPT Physical Therapist - [facility]  Outpatient Physical Therapy- Main Campus [phone]       09/05/23, 2:00 PM

## 2023-09-05 ENCOUNTER — Ambulatory Visit: Payer: No Typology Code available for payment source

## 2023-09-05 DIAGNOSIS — R262 Difficulty in walking, not elsewhere classified: Secondary | ICD-10-CM

## 2023-09-05 DIAGNOSIS — R2681 Unsteadiness on feet: Secondary | ICD-10-CM

## 2023-09-05 DIAGNOSIS — M6281 Muscle weakness (generalized): Secondary | ICD-10-CM

## 2023-09-05 DIAGNOSIS — R269 Unspecified abnormalities of gait and mobility: Secondary | ICD-10-CM

## 2023-09-06 NOTE — Therapy (Signed)
 OUTPATIENT PHYSICAL THERAPY NEURO TREATMENT       Patient Name: Michelle Ruiz MRN: [id number] DOB:1970/10/19, 53 y.o., female Today's Date: 09/10/2023   PCP: Rex Castor  REFERRING PROVIDER: Devora Folks K  END OF SESSION:  PT End of Session - 09/10/23 1309     Visit Number 21    Number of Visits 36    Date for PT Re-Evaluation 10/08/23    Progress Note Due on Visit 70    PT Start Time 1314    PT Stop Time 1359    PT Time Calculation (min) 45 min    Equipment Utilized During Treatment Gait belt    Activity Tolerance Patient tolerated treatment well    Behavior During Therapy WFL for tasks assessed/performed                         Past Medical History:  Diagnosis Date   Abnormal Pap smear of cervix    Actinic keratosis    Anxiety    Basal cell carcinoma 04/14/2008   Right upper ant. thigh. BCC with sclerosis.   Basal cell carcinoma 08/14/2008   Right lat. lower thigh. Superficial.    Basal cell carcinoma 04/29/2019   Right mid forearm. Superficial and nodular patterns. EDC.   Basal cell carcinoma 08/17/2020   Right upper antecubital. EDC.   Basal cell carcinoma 08/23/2021   Left Lower Leg, EDC   Kidney stones    MS (multiple sclerosis) (HCC)    UTI (urinary tract infection)    Past Surgical History:  Procedure Laterality Date   BREAST BIOPSY Right 2010   stereotactic biopsy/ neg/ dr.byrnett   BREAST BIOPSY Left 2012   neg/dr. brynett   CESAREAN SECTION  2008   RPH   TONSILLECTOMY  2006   There are no active problems to display for this patient.   ONSET DATE: diagnosed 12 years ago.   REFERRING DIAG: MS, gait and mobility   THERAPY DIAG:  Difficulty in walking, not elsewhere classified  Abnormality of gait and mobility  Unsteadiness on feet  Muscle weakness (generalized)  Rationale for Evaluation and Treatment: Rehabilitation  SUBJECTIVE:                                                                                                                                                                                              SUBJECTIVE STATEMENT:  Patient reports no aches or pains. Is still emotional over loss of dog.   Pt accompanied by: self  PERTINENT HISTORY: Patient was seen by physical therapy until September of last year. Pmh includes MS,  UTI, anxiety. Has extreme spasticity in LLE. Extreme cold and heat affect her LLE. Patient is now taking hormonal medication. Patient is a Chartered certified accountant.   PAIN:  Are you having pain? No  PRECAUTIONS: None  RED FLAGS: None   WEIGHT BEARING RESTRICTIONS: No  FALLS: Has patient fallen in last 6 months? No  LIVING ENVIRONMENT: Lives with: lives with their family Lives in: House/apartment Stairs: no stairs to enter, lives on second floor Has following equipment at home: Single point cane and shower chair  PLOF: Independent  PATIENT GOALS: continue walking as long as she can.   OBJECTIVE:  Note: Objective measures were completed at Evaluation unless otherwise noted.  DIAGNOSTIC FINDINGS: n/a  COGNITION: Overall cognitive status: Within functional limits for tasks assessed   SENSATION: WFL; slight decrease to RLE   COORDINATION: Heel slide: limited LLE    MUSCLE TONE: LLE: Mild and Moderate   POSTURE: No Significant postural limitations  LOWER EXTREMITY ROM:     WFL  LOWER EXTREMITY MMT:    MMT Right Eval Left Eval  Hip flexion 6.4 4.2  Hip extension    Hip abduction 7.9 3.4  Hip adduction 7.5 8.5  Hip internal rotation    Hip external rotation    Knee flexion 11.1 10.2  Knee extension 7.8 10  Ankle dorsiflexion 10.8 6.8  Ankle plantarflexion 7.2 6.9  Ankle inversion    Ankle eversion    (Blank rows = not tested)  BED MOBILITY:  Scooting into bed is very challenging at end of night  TRANSFERS: Assistive device utilized: Single point cane  Sit to stand: Complete Independence Stand to sit: Complete  Independence Chair to chair: Complete Independence    STAIRS: Level of Assistance: CGA Stair Negotiation Technique: Alternating Pattern  with Single Rail on Right Number of Stairs: 5  Height of Stairs: 6 inch    GAIT: Gait pattern: step through pattern and poor foot clearance- Left Distance walked: 60 ft Assistive device utilized: None Level of assistance: CGA Comments: L foot scuffing  FUNCTIONAL TESTS:  5 times sit to stand: 13 seconds with arms crossed.  6 minute walk test: next session  Functional gait assessment: 14/30  PATIENT SURVEYS:  ABC scale 60.5%                                                                                                                               TREATMENT DATE: 09/10/23     TherAct 6 step toe taps with large arm swings 10x each LE; very fatiguing  6 step up/down 10x each LE  6 lateral step up/down 10x each side    In hallway:  Toss ball to PT with SPT guarding 86 ft x4 trials forward/backwards walking ; backwards ambulation challenging   Sit to stand with arm swings    Adduction ball squeeze      Neuro Re-ed: Standing with CGA next to support surface:  Airex pad: one foot  on 6 step one foot on airex pad, hold position for 30 seconds, switch legs, 2x each LE; Airex pad: dual task with reaching and scanning with dual task     PATIENT EDUCATION: Education details: Exercise technique; importance of daily stretching Person educated: Patient Education method: Explanation, Demonstration, Tactile cues, and Verbal cues Education comprehension: verbalized understanding, returned demonstration, verbal cues required, and tactile cues required   HOME EXERCISE PROGRAM: Access Code: [email or web address] URL: [email or web address] Date: 08/13/2023 Prepared by: Ferrell Hu  Exercises - Hooklying Single Knee to Chest Stretch  - 1-2 x daily - 3 sets - 30 sec  hold - Supine Lower Trunk Rotation  - 1-2 x daily - 3 sets -  30 sec hold - Supine Hamstring Stretch with Strap  - 1-2 x daily - 3 sets - 30 sec hold - Seated Hamstring Stretch  - 1-2 x daily - 3 sets - 30 sec hold - Supine Hip Internal and External Rotation  - 1-2 x daily - 3 sets - 10 reps - Seated Hip External Rotation Stretch  - 1 x daily - 3 sets - 30 sec hold - Supine Figure 4 Piriformis Stretch  - 1 x daily - 3 sets - 30 sec hold       Access Code: [email or web address] URL: [email or web address] Date: 05/29/2023 Prepared by: Conall Vangorder  Exercises - Towel Scrunches  - 1 x daily - 7 x weekly - 2 sets - 10 reps - 5 hold - Toe Spreading  - 1 x daily - 7 x weekly - 2 sets - 10 reps - 5 hold - Seated Ankle Alphabet  - 1 x daily - 7 x weekly - 2 sets - 1 reps - 5 hold  GOALS: Goals reviewed with patient? Yes  SHORT TERM GOALS: Target date: 05/22/2023  Patient will be independent in home exercise program to improve strength/mobility for better functional independence with ADLs. Baseline: 4/14: compliant  Goal status: MET     LONG TERM GOALS: Target date: 10/08/2023    Patient will increase Functional Gait Assessment score to >25/30 as to reduce fall risk and improve dynamic gait safety with community ambulation. Baseline: 1/28: 14/30  4/14: 17/30  6/11: 18/30 Goal status: Partially Met  2.   Patient (< 37 years old) will complete five times sit to stand test in < 10 seconds indicating an increased LE strength and improved balance. Baseline: 1/28: 13 seconds 4/14: 10.72 seconds no hands 6/11: 10 seconds no hands  Goal status: MET  3.  Patient will increase six minute walk test distance to >1000 for progression to community ambulator and improve gait ability Baseline: Perform next session 3/4: 820 ft 4/14: 890 ft with [facility]  6/11: 888 ft with SPC Goal status: Partially Met   4.  Patient will return to nightly ambulation's with less than five foot scuffs per ambulation to return to PLOF.  Baseline: 1/28: limited ambulation 4/14:  some limitations pending on day. Encouraged to perform multiple short walks 6/11: started doing nightly walks ; decreased foot scuffs  Goal status: Partially Met   5. Patient will increase ABC scale score >80% to demonstrate better functional mobility and better confidence with ADLs.  Baseline: 1/28: 60%  4/14: 65%  6/11: 68%  Goal status: Partially Met   ASSESSMENT:  CLINICAL IMPRESSION:    Patient is challenged with large arm swings with toe taps. Large lateral steps are improving with improved spatial awareness and foot positioning. Backwards ambulation is very challenging but tolerated  well.  Pt will continue to benefit from skilled therapy to address remaining deficits in order to improve overall QoL and return to PLOF.      OBJECTIVE IMPAIRMENTS: Abnormal gait, decreased activity tolerance, decreased balance, decreased coordination, decreased endurance, decreased mobility, difficulty walking, decreased strength, impaired perceived functional ability, impaired flexibility, impaired UE functional use, and improper body mechanics.   ACTIVITY LIMITATIONS: carrying, lifting, bending, sitting, standing, squatting, stairs, transfers, bed mobility, dressing, reach over head, hygiene/grooming, locomotion level, and caring for others  PARTICIPATION LIMITATIONS: meal prep, cleaning, laundry, interpersonal relationship, driving, shopping, community activity, and occupation  PERSONAL FACTORS: Age, Fitness, Past/current experiences, Profession, Time since onset of injury/illness/exacerbation, and 1-2 comorbidities: MS anxiety are also affecting patient's functional outcome.   REHAB POTENTIAL: Good  CLINICAL DECISION MAKING: Evolving/moderate complexity  EVALUATION COMPLEXITY: Moderate  PLAN:  PT FREQUENCY: 2x/week  PT DURATION: 12 weeks  PLANNED INTERVENTIONS: 97164- PT Re-evaluation, 97110-Therapeutic exercises, 97530- Therapeutic activity, 97112- Neuromuscular re-education, 97535- Self  Care, 08657- Manual therapy, [phone]- Gait training, [phone]- Orthotic Fit/training, [phone]- Canalith repositioning, Patient/Family education, Balance training, Stair training, Taping, Dry Needling, Joint mobilization, Joint manipulation, Spinal mobilization, Vestibular training, Visual/preceptual remediation/compensation, DME instructions, Cryotherapy, and Moist heat  PLAN FOR NEXT SESSION:  airex pad/balance, LLE strengthening, HEP , LLE coordination. Adding cognitive/manual dual task with dynamic balance activities   Kindell Strada  Brain Cahill, PT, DPT Physical Therapist - [facility]  Outpatient Physical Therapy- Main Campus [phone]       09/10/23, 2:05 PM

## 2023-09-10 ENCOUNTER — Ambulatory Visit: Payer: No Typology Code available for payment source

## 2023-09-10 DIAGNOSIS — R2681 Unsteadiness on feet: Secondary | ICD-10-CM

## 2023-09-10 DIAGNOSIS — R262 Difficulty in walking, not elsewhere classified: Secondary | ICD-10-CM | POA: Diagnosis not present

## 2023-09-10 DIAGNOSIS — R269 Unspecified abnormalities of gait and mobility: Secondary | ICD-10-CM

## 2023-09-10 DIAGNOSIS — M6281 Muscle weakness (generalized): Secondary | ICD-10-CM

## 2023-09-12 ENCOUNTER — Ambulatory Visit: Payer: No Typology Code available for payment source

## 2023-09-12 DIAGNOSIS — M6281 Muscle weakness (generalized): Secondary | ICD-10-CM

## 2023-09-12 DIAGNOSIS — R262 Difficulty in walking, not elsewhere classified: Secondary | ICD-10-CM

## 2023-09-12 DIAGNOSIS — R269 Unspecified abnormalities of gait and mobility: Secondary | ICD-10-CM

## 2023-09-12 DIAGNOSIS — R2681 Unsteadiness on feet: Secondary | ICD-10-CM

## 2023-09-12 NOTE — Therapy (Signed)
 OUTPATIENT PHYSICAL THERAPY NEURO TREATMENT       Patient Name: Michelle Ruiz MRN: [id number] DOB:February 24, 1971, 53 y.o., female Today's Date: 09/12/2023   PCP: Rex Castor  REFERRING PROVIDER: Devora Folks K  END OF SESSION:  PT End of Session - 09/12/23 1350     Visit Number 22    Number of Visits 36    Date for PT Re-Evaluation 10/08/23    Progress Note Due on Visit 70    PT Start Time 1400    PT Stop Time 1440    PT Time Calculation (min) 40 min    Equipment Utilized During Treatment Gait belt    Activity Tolerance Patient tolerated treatment well    Behavior During Therapy WFL for tasks assessed/performed                          Past Medical History:  Diagnosis Date   Abnormal Pap smear of cervix    Actinic keratosis    Anxiety    Basal cell carcinoma 04/14/2008   Right upper ant. thigh. BCC with sclerosis.   Basal cell carcinoma 08/14/2008   Right lat. lower thigh. Superficial.    Basal cell carcinoma 04/29/2019   Right mid forearm. Superficial and nodular patterns. EDC.   Basal cell carcinoma 08/17/2020   Right upper antecubital. EDC.   Basal cell carcinoma 08/23/2021   Left Lower Leg, EDC   Kidney stones    MS (multiple sclerosis) (HCC)    UTI (urinary tract infection)    Past Surgical History:  Procedure Laterality Date   BREAST BIOPSY Right 2010   stereotactic biopsy/ neg/ dr.byrnett   BREAST BIOPSY Left 2012   neg/dr. brynett   CESAREAN SECTION  2008   RPH   TONSILLECTOMY  2006   There are no active problems to display for this patient.   ONSET DATE: diagnosed 12 years ago.   REFERRING DIAG: MS, gait and mobility   THERAPY DIAG:  Difficulty in walking, not elsewhere classified  Abnormality of gait and mobility  Unsteadiness on feet  Muscle weakness (generalized)  Rationale for Evaluation and Treatment: Rehabilitation  SUBJECTIVE:                                                                                                                                                                                              SUBJECTIVE STATEMENT:  Pt reports she is feeling good. When she is walking at night she notices that her R hip starts to hurt after a while of walking. She states that she felt a little sore after  previous session, but it didn't stop her from doing anything.   Pt accompanied by: self  PERTINENT HISTORY: Patient was seen by physical therapy until September of last year. Pmh includes MS, UTI, anxiety. Has extreme spasticity in LLE. Extreme cold and heat affect her LLE. Patient is now taking hormonal medication. Patient is a Chartered certified accountant.   PAIN:  Are you having pain? No  PRECAUTIONS: None  RED FLAGS: None   WEIGHT BEARING RESTRICTIONS: No  FALLS: Has patient fallen in last 6 months? No  LIVING ENVIRONMENT: Lives with: lives with their family Lives in: House/apartment Stairs: no stairs to enter, lives on second floor Has following equipment at home: Single point cane and shower chair  PLOF: Independent  PATIENT GOALS: continue walking as long as she can.   OBJECTIVE:  Note: Objective measures were completed at Evaluation unless otherwise noted.  DIAGNOSTIC FINDINGS: n/a  COGNITION: Overall cognitive status: Within functional limits for tasks assessed   SENSATION: WFL; slight decrease to RLE   COORDINATION: Heel slide: limited LLE    MUSCLE TONE: LLE: Mild and Moderate   POSTURE: No Significant postural limitations  LOWER EXTREMITY ROM:     WFL  LOWER EXTREMITY MMT:    MMT Right Eval Left Eval  Hip flexion 6.4 4.2  Hip extension    Hip abduction 7.9 3.4  Hip adduction 7.5 8.5  Hip internal rotation    Hip external rotation    Knee flexion 11.1 10.2  Knee extension 7.8 10  Ankle dorsiflexion 10.8 6.8  Ankle plantarflexion 7.2 6.9  Ankle inversion    Ankle eversion    (Blank rows = not tested)  BED MOBILITY:  Scooting into bed is very  challenging at end of night  TRANSFERS: Assistive device utilized: Single point cane  Sit to stand: Complete Independence Stand to sit: Complete Independence Chair to chair: Complete Independence    STAIRS: Level of Assistance: CGA Stair Negotiation Technique: Alternating Pattern  with Single Rail on Right Number of Stairs: 5  Height of Stairs: 6 inch    GAIT: Gait pattern: step through pattern and poor foot clearance- Left Distance walked: 60 ft Assistive device utilized: None Level of assistance: CGA Comments: L foot scuffing  FUNCTIONAL TESTS:  5 times sit to stand: 13 seconds with arms crossed.  6 minute walk test: next session  Functional gait assessment: 14/30  PATIENT SURVEYS:  ABC scale 60.5%                                                                                                                               TREATMENT DATE: 09/12/23     TherAct 6 step up/down 10x each LE  6 lateral step up/down 10x each side  Dynadisc L ankle PF/DF x10 each direction Dynadisc L ankle inversion/eversion x10 each direction Dynadisc L ankle circles x10 each direction   In hallway:  Toss ball to PT with SPT guarding 86 ft x4  trials forward/backwards walking ; backwards ambulation challenging with slower cadence    Neuro Re-ed: STS with Airex x10- pt reports difficulty as easy to medium  Standing with CGA next to support surface:  Airex pad:  -one foot on 6 step one foot on airex pad, hold position for 30 seconds, switch legs, 2x each LE - marches 2x10 Airex pad: dual task with reaching and scanning with dual task x2 rounds    PATIENT EDUCATION: Education details: Exercise technique; importance of daily stretching Person educated: Patient Education method: Explanation, Demonstration, Tactile cues, and Verbal cues Education comprehension: verbalized understanding, returned demonstration, verbal cues required, and tactile cues required   HOME EXERCISE  PROGRAM: Access Code: [email or web address] URL: [email or web address] Date: 08/13/2023 Prepared by: Ferrell Hu  Exercises - Hooklying Single Knee to Chest Stretch  - 1-2 x daily - 3 sets - 30 sec  hold - Supine Lower Trunk Rotation  - 1-2 x daily - 3 sets - 30 sec hold - Supine Hamstring Stretch with Strap  - 1-2 x daily - 3 sets - 30 sec hold - Seated Hamstring Stretch  - 1-2 x daily - 3 sets - 30 sec hold - Supine Hip Internal and External Rotation  - 1-2 x daily - 3 sets - 10 reps - Seated Hip External Rotation Stretch  - 1 x daily - 3 sets - 30 sec hold - Supine Figure 4 Piriformis Stretch  - 1 x daily - 3 sets - 30 sec hold       Access Code: [email or web address] URL: [email or web address] Date: 05/29/2023 Prepared by: Marina  Moser  Exercises - Towel Scrunches  - 1 x daily - 7 x weekly - 2 sets - 10 reps - 5 hold - Toe Spreading  - 1 x daily - 7 x weekly - 2 sets - 10 reps - 5 hold - Seated Ankle Alphabet  - 1 x daily - 7 x weekly - 2 sets - 1 reps - 5 hold  GOALS: Goals reviewed with patient? Yes  SHORT TERM GOALS: Target date: 05/22/2023  Patient will be independent in home exercise program to improve strength/mobility for better functional independence with ADLs. Baseline: 4/14: compliant  Goal status: MET     LONG TERM GOALS: Target date: 10/08/2023    Patient will increase Functional Gait Assessment score to >25/30 as to reduce fall risk and improve dynamic gait safety with community ambulation. Baseline: 1/28: 14/30  4/14: 17/30  6/11: 18/30 Goal status: Partially Met  2.   Patient (< 63 years old) will complete five times sit to stand test in < 10 seconds indicating an increased LE strength and improved balance. Baseline: 1/28: 13 seconds 4/14: 10.72 seconds no hands 6/11: 10 seconds no hands  Goal status: MET  3.  Patient will increase six minute walk test distance to >1000 for progression to community ambulator and improve gait  ability Baseline: Perform next session 3/4: 820 ft 4/14: 890 ft with [facility]  6/11: 888 ft with SPC Goal status: Partially Met   4.  Patient will return to nightly ambulation's with less than five foot scuffs per ambulation to return to PLOF.  Baseline: 1/28: limited ambulation 4/14: some limitations pending on day. Encouraged to perform multiple short walks 6/11: started doing nightly walks ; decreased foot scuffs  Goal status: Partially Met   5. Patient will increase ABC scale score >80% to demonstrate better functional mobility and better confidence with ADLs.  Baseline: 1/28: 60%  4/14: 65%  6/11:  68%  Goal status: Partially Met   ASSESSMENT:  CLINICAL IMPRESSION:    Pt tolerated dynamic balance well with decreased UE support throughout session.  Pt demonstrated increased ankle righting reaction with notable musculature activation visible in R ankle specifically. Pt performed step-ups well with less instances of L foot catching when clearing the step and improved spatial awareness with foot placement. Pt remains motivated to improve her balance and remains active daily. Pt will continue to benefit from skilled therapy to address remaining deficits in order to improve overall QoL and return to PLOF.     OBJECTIVE IMPAIRMENTS: Abnormal gait, decreased activity tolerance, decreased balance, decreased coordination, decreased endurance, decreased mobility, difficulty walking, decreased strength, impaired perceived functional ability, impaired flexibility, impaired UE functional use, and improper body mechanics.   ACTIVITY LIMITATIONS: carrying, lifting, bending, sitting, standing, squatting, stairs, transfers, bed mobility, dressing, reach over head, hygiene/grooming, locomotion level, and caring for others  PARTICIPATION LIMITATIONS: meal prep, cleaning, laundry, interpersonal relationship, driving, shopping, community activity, and occupation  PERSONAL FACTORS: Age, Fitness, Past/current  experiences, Profession, Time since onset of injury/illness/exacerbation, and 1-2 comorbidities: MS anxiety are also affecting patient's functional outcome.   REHAB POTENTIAL: Good  CLINICAL DECISION MAKING: Evolving/moderate complexity  EVALUATION COMPLEXITY: Moderate  PLAN:  PT FREQUENCY: 2x/week  PT DURATION: 12 weeks  PLANNED INTERVENTIONS: 97164- PT Re-evaluation, 97110-Therapeutic exercises, 97530- Therapeutic activity, 97112- Neuromuscular re-education, 97535- Self Care, 16109- Manual therapy, [phone]- Gait training, [phone]- Orthotic Fit/training, [phone]- Canalith repositioning, Patient/Family education, Balance training, Stair training, Taping, Dry Needling, Joint mobilization, Joint manipulation, Spinal mobilization, Vestibular training, Visual/preceptual remediation/compensation, DME instructions, Cryotherapy, and Moist heat  PLAN FOR NEXT SESSION:  airex pad/balance, LLE strengthening, HEP , LLE coordination. Adding cognitive/manual dual task with dynamic balance activities      Selinda Dales, SPT  This entire session was performed under direct supervision and direction of a licensed therapist/therapist assistant . I have personally read, edited and approve of the note as written.  Marina  Sadie Crank, DPT Physical Therapist - [facility]  Outpatient Physical Therapy- Main Campus [phone]     09/12/23, 4:24 PM

## 2023-09-17 ENCOUNTER — Ambulatory Visit: Payer: No Typology Code available for payment source | Admitting: Dermatology

## 2023-09-17 ENCOUNTER — Ambulatory Visit: Payer: No Typology Code available for payment source

## 2023-09-17 DIAGNOSIS — L570 Actinic keratosis: Secondary | ICD-10-CM

## 2023-09-17 DIAGNOSIS — R269 Unspecified abnormalities of gait and mobility: Secondary | ICD-10-CM

## 2023-09-17 DIAGNOSIS — C44719 Basal cell carcinoma of skin of left lower limb, including hip: Secondary | ICD-10-CM

## 2023-09-17 DIAGNOSIS — L739 Follicular disorder, unspecified: Secondary | ICD-10-CM

## 2023-09-17 DIAGNOSIS — D1801 Hemangioma of skin and subcutaneous tissue: Secondary | ICD-10-CM

## 2023-09-17 DIAGNOSIS — D492 Neoplasm of unspecified behavior of bone, soft tissue, and skin: Secondary | ICD-10-CM | POA: Diagnosis not present

## 2023-09-17 DIAGNOSIS — L821 Other seborrheic keratosis: Secondary | ICD-10-CM | POA: Diagnosis not present

## 2023-09-17 DIAGNOSIS — L578 Other skin changes due to chronic exposure to nonionizing radiation: Secondary | ICD-10-CM

## 2023-09-17 DIAGNOSIS — D229 Melanocytic nevi, unspecified: Secondary | ICD-10-CM

## 2023-09-17 DIAGNOSIS — L719 Rosacea, unspecified: Secondary | ICD-10-CM

## 2023-09-17 DIAGNOSIS — L309 Dermatitis, unspecified: Secondary | ICD-10-CM

## 2023-09-17 DIAGNOSIS — Z1283 Encounter for screening for malignant neoplasm of skin: Secondary | ICD-10-CM

## 2023-09-17 DIAGNOSIS — D2271 Melanocytic nevi of right lower limb, including hip: Secondary | ICD-10-CM

## 2023-09-17 DIAGNOSIS — R262 Difficulty in walking, not elsewhere classified: Secondary | ICD-10-CM

## 2023-09-17 DIAGNOSIS — M6281 Muscle weakness (generalized): Secondary | ICD-10-CM

## 2023-09-17 DIAGNOSIS — Z79899 Other long term (current) drug therapy: Secondary | ICD-10-CM

## 2023-09-17 DIAGNOSIS — W908XXA Exposure to other nonionizing radiation, initial encounter: Secondary | ICD-10-CM | POA: Diagnosis not present

## 2023-09-17 DIAGNOSIS — D225 Melanocytic nevi of trunk: Secondary | ICD-10-CM

## 2023-09-17 DIAGNOSIS — L814 Other melanin hyperpigmentation: Secondary | ICD-10-CM

## 2023-09-17 DIAGNOSIS — Z85828 Personal history of other malignant neoplasm of skin: Secondary | ICD-10-CM

## 2023-09-17 DIAGNOSIS — R2681 Unsteadiness on feet: Secondary | ICD-10-CM

## 2023-09-17 DIAGNOSIS — D485 Neoplasm of uncertain behavior of skin: Secondary | ICD-10-CM

## 2023-09-17 DIAGNOSIS — D2262 Melanocytic nevi of left upper limb, including shoulder: Secondary | ICD-10-CM

## 2023-09-17 HISTORY — DX: Actinic keratosis: L57.0

## 2023-09-17 MED ORDER — ORACEA 40 MG PO CPDR: 40.0000 mg | DELAYED_RELEASE_CAPSULE | Freq: Every day | ORAL | 5 refills | Status: DC

## 2023-09-17 MED ORDER — IVERMECTIN 1 % EX CREA: TOPICAL_CREAM | CUTANEOUS | 5 refills | Status: DC

## 2023-09-17 MED ORDER — METRONIDAZOLE 0.75 % EX CREA
TOPICAL_CREAM | CUTANEOUS | 5 refills | Status: AC
Start: 2023-09-17 — End: ?

## 2023-09-17 NOTE — Therapy (Signed)
 OUTPATIENT PHYSICAL THERAPY NEURO TREATMENT       Patient Name: Michelle Ruiz MRN: [id number] DOB:Jun 27, 1970, 53 y.o., female Today's Date: 09/17/2023   PCP: Sherial Bail  REFERRING PROVIDER: Maree Hila K  END OF SESSION:  PT End of Session - 09/17/23 1306     Visit Number 23    Number of Visits 36    Date for PT Re-Evaluation 10/08/23    Progress Note Due on Visit 70    PT Start Time 1315    PT Stop Time 1356    PT Time Calculation (min) 41 min    Equipment Utilized During Treatment Gait belt    Activity Tolerance Patient tolerated treatment well    Behavior During Therapy WFL for tasks assessed/performed                           Past Medical History:  Diagnosis Date   Abnormal Pap smear of cervix    Actinic keratosis    Anxiety    Basal cell carcinoma 04/14/2008   Right upper ant. thigh. BCC with sclerosis.   Basal cell carcinoma 08/14/2008   Right lat. lower thigh. Superficial.    Basal cell carcinoma 04/29/2019   Right mid forearm. Superficial and nodular patterns. EDC.   Basal cell carcinoma 08/17/2020   Right upper antecubital. EDC.   Basal cell carcinoma 08/23/2021   Left Lower Leg, EDC   Kidney stones    MS (multiple sclerosis) (HCC)    UTI (urinary tract infection)    Past Surgical History:  Procedure Laterality Date   BREAST BIOPSY Right 2010   stereotactic biopsy/ neg/ dr.byrnett   BREAST BIOPSY Left 2012   neg/dr. brynett   CESAREAN SECTION  2008   RPH   TONSILLECTOMY  2006   There are no active problems to display for this patient.   ONSET DATE: diagnosed 12 years ago.   REFERRING DIAG: MS, gait and mobility   THERAPY DIAG:  Difficulty in walking, not elsewhere classified  Abnormality of gait and mobility  Unsteadiness on feet  Muscle weakness (generalized)  Rationale for Evaluation and Treatment: Rehabilitation  SUBJECTIVE:                                                                                                                                                                                              SUBJECTIVE STATEMENT:  Pt reports feeling good today. Pt denies soreness after previous session. Pt states that she went on a walk on Friday that was her longest walk she's done, and it was difficult but she  was able to complete it.   Pt accompanied by: self  PERTINENT HISTORY: Patient was seen by physical therapy until September of last year. Pmh includes MS, UTI, anxiety. Has extreme spasticity in LLE. Extreme cold and heat affect her LLE. Patient is now taking hormonal medication. Patient is a Chartered certified accountant.   PAIN:  Are you having pain? No  PRECAUTIONS: None  RED FLAGS: None   WEIGHT BEARING RESTRICTIONS: No  FALLS: Has patient fallen in last 6 months? No  LIVING ENVIRONMENT: Lives with: lives with their family Lives in: House/apartment Stairs: no stairs to enter, lives on second floor Has following equipment at home: Single point cane and shower chair  PLOF: Independent  PATIENT GOALS: continue walking as long as she can.   OBJECTIVE:  Note: Objective measures were completed at Evaluation unless otherwise noted.  DIAGNOSTIC FINDINGS: n/a  COGNITION: Overall cognitive status: Within functional limits for tasks assessed   SENSATION: WFL; slight decrease to RLE   COORDINATION: Heel slide: limited LLE    MUSCLE TONE: LLE: Mild and Moderate   POSTURE: No Significant postural limitations  LOWER EXTREMITY ROM:     WFL  LOWER EXTREMITY MMT:    MMT Right Eval Left Eval  Hip flexion 6.4 4.2  Hip extension    Hip abduction 7.9 3.4  Hip adduction 7.5 8.5  Hip internal rotation    Hip external rotation    Knee flexion 11.1 10.2  Knee extension 7.8 10  Ankle dorsiflexion 10.8 6.8  Ankle plantarflexion 7.2 6.9  Ankle inversion    Ankle eversion    (Blank rows = not tested)  BED MOBILITY:  Scooting into bed is very challenging at end of  night  TRANSFERS: Assistive device utilized: Single point cane  Sit to stand: Complete Independence Stand to sit: Complete Independence Chair to chair: Complete Independence    STAIRS: Level of Assistance: CGA Stair Negotiation Technique: Alternating Pattern  with Single Rail on Right Number of Stairs: 5  Height of Stairs: 6 inch    GAIT: Gait pattern: step through pattern and poor foot clearance- Left Distance walked: 60 ft Assistive device utilized: None Level of assistance: CGA Comments: L foot scuffing  FUNCTIONAL TESTS:  5 times sit to stand: 13 seconds with arms crossed.  6 minute walk test: next session  Functional gait assessment: 14/30  PATIENT SURVEYS:  ABC scale 60.5%                                                                                                                               TREATMENT DATE: 09/17/23     TherAct Dynadisc L ankle PF/DF x10 each direction Dynadisc L ankle inversion/eversion x10 each direction Dynadisc L ankle circles x10 each direction   Toe taps over hedgehog x10 L LE  In hallway:  Toss ball to PT with SPT guarding 86 ft x4 trials forward/backwards walking ; backwards ambulation challenging with slower cadence  Toss  ball to PT with SPT guarding 13ft x4 trials lateral stepping- Side stepping to the R more difficult, cues provided to increase L hip flexion to prevent L foot drag    Neuro Re-ed: STS with Airex x10- pt reports difficulty as hard  Standing with CGA next to support surface:  Airex pad dual task with reaching and scanning with dual task x1 round  Blaze pods: Activity Description: 3 pods on floor, 3 pods on mat table- pt taps with R foot/hand when pod lights up green and taps with L foot/hand when pod lights up red Activity Setting:  The Blaze Pod Random setting was chosen to enhance cognitive processing and agility, providing an unpredictable environment to simulate real-world scenarios, and fostering quick  reactions and adaptability.   Number of Pods:  6 Cycles/Sets:  3 sets in seated and 1 set in standing  Duration (Time or Hit Count):  15    PATIENT EDUCATION: Education details: Exercise technique; importance of daily stretching Person educated: Patient Education method: Explanation, Demonstration, Tactile cues, and Verbal cues Education comprehension: verbalized understanding, returned demonstration, verbal cues required, and tactile cues required   HOME EXERCISE PROGRAM: Access Code: [email or web address] URL: [email or web address] Date: 08/13/2023 Prepared by: Reyes London  Exercises - Hooklying Single Knee to Chest Stretch  - 1-2 x daily - 3 sets - 30 sec  hold - Supine Lower Trunk Rotation  - 1-2 x daily - 3 sets - 30 sec hold - Supine Hamstring Stretch with Strap  - 1-2 x daily - 3 sets - 30 sec hold - Seated Hamstring Stretch  - 1-2 x daily - 3 sets - 30 sec hold - Supine Hip Internal and External Rotation  - 1-2 x daily - 3 sets - 10 reps - Seated Hip External Rotation Stretch  - 1 x daily - 3 sets - 30 sec hold - Supine Figure 4 Piriformis Stretch  - 1 x daily - 3 sets - 30 sec hold       Access Code: [email or web address] URL: [email or web address] Date: 05/29/2023 Prepared by: Marina  Moser  Exercises - Towel Scrunches  - 1 x daily - 7 x weekly - 2 sets - 10 reps - 5 hold - Toe Spreading  - 1 x daily - 7 x weekly - 2 sets - 10 reps - 5 hold - Seated Ankle Alphabet  - 1 x daily - 7 x weekly - 2 sets - 1 reps - 5 hold  GOALS: Goals reviewed with patient? Yes  SHORT TERM GOALS: Target date: 05/22/2023  Patient will be independent in home exercise program to improve strength/mobility for better functional independence with ADLs. Baseline: 4/14: compliant  Goal status: MET     LONG TERM GOALS: Target date: 10/08/2023    Patient will increase Functional Gait Assessment score to >25/30 as to reduce fall risk and improve dynamic gait safety with  community ambulation. Baseline: 1/28: 14/30  4/14: 17/30  6/11: 18/30 Goal status: Partially Met  2.   Patient (< 66 years old) will complete five times sit to stand test in < 10 seconds indicating an increased LE strength and improved balance. Baseline: 1/28: 13 seconds 4/14: 10.72 seconds no hands 6/11: 10 seconds no hands  Goal status: MET  3.  Patient will increase six minute walk test distance to >1000 for progression to community ambulator and improve gait ability Baseline: Perform next session 3/4: 820 ft 4/14: 890 ft with [facility]  6/11: 888 ft with SPC Goal status: Partially Met  4.  Patient will return to nightly ambulation's with less than five foot scuffs per ambulation to return to PLOF.  Baseline: 1/28: limited ambulation 4/14: some limitations pending on day. Encouraged to perform multiple short walks 6/11: started doing nightly walks ; decreased foot scuffs  Goal status: Partially Met   5. Patient will increase ABC scale score >80% to demonstrate better functional mobility and better confidence with ADLs.  Baseline: 1/28: 60%  4/14: 65%  6/11: 68%  Goal status: Partially Met   ASSESSMENT:  CLINICAL IMPRESSION:    Pt presented with a minor increase in overall fatigue today, partially due to the heat. During forward/backward walking and side stepping, pt had instances of increased L foot drop limiting her foot clearance during ambulation. Foot clearance improved with cues for increased hip flexion when stepping. Pt tolerated progressions of walking/dynamic balance well and remained motivated throughout session with frequent rest breaks provided.  Pt will continue to benefit from skilled therapy to address remaining deficits in order to improve overall QoL and return to PLOF.     OBJECTIVE IMPAIRMENTS: Abnormal gait, decreased activity tolerance, decreased balance, decreased coordination, decreased endurance, decreased mobility, difficulty walking, decreased strength, impaired  perceived functional ability, impaired flexibility, impaired UE functional use, and improper body mechanics.   ACTIVITY LIMITATIONS: carrying, lifting, bending, sitting, standing, squatting, stairs, transfers, bed mobility, dressing, reach over head, hygiene/grooming, locomotion level, and caring for others  PARTICIPATION LIMITATIONS: meal prep, cleaning, laundry, interpersonal relationship, driving, shopping, community activity, and occupation  PERSONAL FACTORS: Age, Fitness, Past/current experiences, Profession, Time since onset of injury/illness/exacerbation, and 1-2 comorbidities: MS anxiety are also affecting patient's functional outcome.   REHAB POTENTIAL: Good  CLINICAL DECISION MAKING: Evolving/moderate complexity  EVALUATION COMPLEXITY: Moderate  PLAN:  PT FREQUENCY: 2x/week  PT DURATION: 12 weeks  PLANNED INTERVENTIONS: 97164- PT Re-evaluation, 97110-Therapeutic exercises, 97530- Therapeutic activity, 97112- Neuromuscular re-education, 97535- Self Care, 02859- Manual therapy, [phone]- Gait training, [phone]- Orthotic Fit/training, [phone]- Canalith repositioning, Patient/Family education, Balance training, Stair training, Taping, Dry Needling, Joint mobilization, Joint manipulation, Spinal mobilization, Vestibular training, Visual/preceptual remediation/compensation, DME instructions, Cryotherapy, and Moist heat  PLAN FOR NEXT SESSION:  airex pad/balance, LLE strengthening, HEP , LLE coordination. Adding cognitive/manual dual task with dynamic balance activities      Janell Axe, SPT  This entire session was performed under direct supervision and direction of a licensed therapist/therapist assistant . I have personally read, edited and approve of the note as written.  Marina  Leopoldo, PT, DPT Physical Therapist - [facility]  Outpatient Physical Therapy- Main Campus [phone]     09/17/23, 5:21 PM

## 2023-09-17 NOTE — Progress Notes (Signed)
 Follow-Up Visit   Subjective  Michelle Ruiz is a 53 y.o. female who presents for the following: Skin Cancer Screening and Full Body Skin Exam  The patient presents for Total-Body Skin Exam (TBSE) for skin cancer screening and mole check. The patient has spots, moles and lesions to be evaluated, some may be new or changing. She has a rash on the left cheek that came up several months ago, changes at times but never goes away. When she uses her rosacea med, azelaic acid , it worsens. History of BCCs.     The following portions of the chart were reviewed this encounter and updated as appropriate: medications, allergies, medical history  Review of Systems:  No other skin or systemic complaints except as noted in HPI or Assessment and Plan.  Objective  Well appearing patient in no apparent distress; mood and affect are within normal limits.  A full examination was performed including scalp, head, eyes, ears, nose, lips, neck, chest, axillae, abdomen, back, buttocks, bilateral upper extremities, bilateral lower extremities, hands, feet, fingers, toes, fingernails, and toenails. All findings within normal limits unless otherwise noted below.   Relevant physical exam findings are noted in the Assessment and Plan.  L medial lower leg 4 mm pink papule   R spinal mid upper back 4 mm brown macule    Assessment & Plan   SKIN CANCER SCREENING PERFORMED TODAY.  ACTINIC DAMAGE - Chronic condition, secondary to cumulative UV/sun exposure - diffuse scaly erythematous macules with underlying dyspigmentation - Recommend daily broad spectrum sunscreen SPF 30+ to sun-exposed areas, reapply every 2 hours as needed.  - Staying in the shade or wearing long sleeves, sun glasses (UVA+UVB protection) and wide brim hats (4-inch brim around the entire circumference of the hat) are also recommended for sun protection.  - Call for new or changing lesions.  LENTIGINES, SEBORRHEIC KERATOSES, HEMANGIOMAS -  Benign normal skin lesions - Benign-appearing - Call for any changes  SEBORRHEIC KERATOSIS - 0.7 cm gray brown macule at right medial breast - Benign-appearing - Discussed benign etiology and prognosis. - Observe - Call for any changes  MELANOCYTIC NEVI - Tan-brown and/or pink-flesh-colored symmetric macules and papules - 5 x 3 mm medium brown macule at left mid back - 4 mm brown macule with darker center at left posterior shoulder - 2.5 mm med dark brown macule at right medial knee - 4 mm speckled brown macule at left lateral heel - Benign appearing on exam today - Observation - Call clinic for new or changing moles - Recommend daily use of broad spectrum spf 30+ sunscreen to sun-exposed areas.   Atopic dermatitis vs seborrheic dermatitis  Exam:  Mild erythema of the bilateral earlobe and helix.    Chronic and persistent condition with duration or expected duration over one year. Condition is improving with treatment but not currently at goal.    Atopic dermatitis (eczema) is a chronic, relapsing, pruritic condition that can significantly affect quality of life. It is often associated with allergic rhinitis and/or asthma and can require treatment with topical medications, phototherapy, or in severe cases biologic injectable medication (Dupixent; Adbry) or Oral JAK inhibitors.   Treatment Plan: Continue Pimecromlimus cream to aa BID PRN. Pt has at home.    HISTORY OF BASAL CELL CARCINOMA OF THE SKIN - No evidence of recurrence today - Recommend regular full body skin exams - Recommend daily broad spectrum sunscreen SPF 30+ to sun-exposed areas, reapply every 2 hours as needed.  - Call if  any new or changing lesions are noted between office visits  ROSACEA Exam Scattered pink papules on left medial cheek and jaw; erythema and telangiectasias of the nose and cheeks.  Chronic and persistent condition with duration or expected duration over one year. Condition is  bothersome/symptomatic for patient. Currently flared.  Rosacea is a chronic progressive skin condition usually affecting the face of adults, causing redness and/or acne bumps. It is treatable but not curable. It sometimes affects the eyes (ocular rosacea) as well. It may respond to topical and/or systemic medication and can flare with stress, sun exposure, alcohol, exercise, topical steroids (including hydrocortisone /cortisone 10) and some foods.  Daily application of broad spectrum spf 30+ sunscreen to face is recommended to reduce flares.  Treatment Plan D/C Azelaic acid  gel Start metronidazole 0.75% cream Apply to aa face qam dsp 45g 5Rf.  Start Soolantra cream Apply to aa face every night dsp 45g 5Rf.  Start Oracea 40 MG take 1 po every day with food dsp #30 5Rf. Doxycycline should be taken with food to prevent nausea. Do not lay down for 30 minutes after taking. Be cautious with sun exposure and use good sun protection while on this medication. Pregnant women should not take this medication.   Recommend daily broad spectrum sunscreen SPF 30+ to sun-exposed areas, reapply every 2 hours as needed. Call for new or changing lesions.  Staying in the shade or wearing long sleeves, sun glasses (UVA+UVB protection) and wide brim hats (4-inch brim around the entire circumference of the hat) are also recommended for sun protection.    FOLLICULITIS VS EARLY BCC Exam: L lower medial calf with 4 mm pink slightly pearly papule, see photo.  Recheck on follow-up. If still present, will biopsy.  ROSACEA   Related Medications Azelaic Acid  15 % gel Apply 1 application topically 2 (two) times daily. After skin is thoroughly washed and patted dry, gently but thoroughly massage a thin film of azelaic acid  cream into the affected area twice daily, in the morning and evening. Ivermectin (SOOLANTRA) 1 % CREA Apply to affected areas rosacea every night. Doxycycline, Rosacea, (ORACEA) 40 MG CPDR Take 40 mg by  mouth daily. metroNIDAZOLE (METROCREAM) 0.75 % cream Apply to affected areas face every morning for rosacea. NEOPLASM OF UNCERTAIN BEHAVIOR OF SKIN (2) L medial lower leg Epidermal / dermal shaving  Lesion diameter (cm):  0.4 Informed consent: discussed and consent obtained   Patient was prepped and draped in usual sterile fashion: Area prepped with alcohol. Anesthesia: the lesion was anesthetized in a standard fashion   Anesthetic:  1% lidocaine w/ epinephrine 1-100,000 buffered w/ 8.4% NaHCO3 Instrument used: flexible razor blade   Hemostasis achieved with: pressure, aluminum chloride and electrodesiccation   Outcome: patient tolerated procedure well    Destruction of lesion  Destruction method: electrodesiccation and curettage   Informed consent: discussed and consent obtained   Curettage performed in three different directions: Yes   Electrodesiccation performed over the curetted area: Yes   Final wound size (cm):  0.6 Hemostasis achieved with:  pressure, aluminum chloride and electrodesiccation Outcome: patient tolerated procedure well with no complications   Post-procedure details: wound care instructions given   Post-procedure details comment:  Ointment and bandage applied. Specimen 1 - Surgical pathology Differential Diagnosis: Folliculitis r/o BCC Check Margins: No EDC today R spinal mid upper back Epidermal / dermal shaving  Lesion diameter (cm):  0.7 Informed consent: discussed and consent obtained   Patient was prepped and draped in usual sterile fashion:  Area prepped with alcohol. Anesthesia: the lesion was anesthetized in a standard fashion   Anesthetic:  1% lidocaine w/ epinephrine 1-100,000 buffered w/ 8.4% NaHCO3 Instrument used: flexible razor blade   Hemostasis achieved with: pressure, aluminum chloride and electrodesiccation   Outcome: patient tolerated procedure well   Post-procedure details: wound care instructions given   Post-procedure details  comment:  Ointment and small bandage applied Specimen 2 - Surgical pathology Differential Diagnosis: Nevus vs SK r/o Atypia Check Margins: Yes Return in about 6 months (around 03/18/2024) for TBSE, Hx BCC.  IAndrea Kerns, CMA, am acting as scribe for Rexene Rattler, MD .   Documentation: I have reviewed the above documentation for accuracy and completeness, and I agree with the above.  Rexene Rattler, MD

## 2023-09-17 NOTE — Patient Instructions (Addendum)
 Your prescription was sent to [facility] in [place]. A representative from [facility] will contact you within 3 business hours to verify your address and insurance information to schedule a free delivery. If for any reason you do not receive a phone call from them, please reach out to them. Their phone number is [phone] and their hours are Monday-Friday 9:00 am-5:00 pm.   Wound Care Instructions  Cleanse wound gently with soap and water once a day then pat dry with clean gauze. Apply a thin coat of Petrolatum (petroleum jelly, Vaseline) over the wound (unless you have an allergy to this). We recommend that you use a new, sterile tube of Vaseline. Do not pick or remove scabs. Do not remove the yellow or white healing tissue from the base of the wound.  Cover the wound with fresh, clean, nonstick gauze and secure with paper tape. You may use Band-Aids in place of gauze and tape if the wound is small enough, but would recommend trimming much of the tape off as there is often too much. Sometimes Band-Aids can irritate the skin.  You should call the office for your biopsy report after 1 week if you have not already been contacted.  If you experience any problems, such as abnormal amounts of bleeding, swelling, significant bruising, significant pain, or evidence of infection, please call the office immediately.  FOR ADULT SURGERY PATIENTS: If you need something for pain relief you may take 1 extra strength Tylenol (acetaminophen) AND 2 Ibuprofen (200mg  each) together every 4 hours as needed for pain. (do not take these if you are allergic to them or if you have a reason you should not take them.) Typically, you may only need pain medication for 1 to 3 days.      Due to recent changes in healthcare laws, you may see results of your pathology and/or laboratory studies on MyChart before the doctors have had a chance to review them. We understand that in some cases there may be results  that are confusing or concerning to you. Please understand that not all results are received at the same time and often the doctors may need to interpret multiple results in order to provide you with the best plan of care or course of treatment. Therefore, we ask that you please give us  2 business days to thoroughly review all your results before contacting the office for clarification. Should we see a critical lab result, you will be contacted sooner.   If You Need Anything After Your Visit  If you have any questions or concerns for your doctor, please call our main line at [phone] and press option 4 to reach your doctor's medical assistant. If no one answers, please leave a voicemail as directed and we will return your call as soon as possible. Messages left after 4 pm will be answered the following business day.   You may also send us  a message via MyChart. We typically respond to MyChart messages within 1-2 business days.  For prescription refills, please ask your pharmacy to contact our office. Our fax number is [phone].  If you have an urgent issue when the clinic is closed that cannot wait until the next business day, you can page your doctor at the number below.    Please note that while we do our best to be available for urgent issues outside of office hours, we are not available 24/7.   If you have an urgent issue and are unable to reach us ,  you may choose to seek medical care at your doctor's office, retail clinic, urgent care center, or emergency room.  If you have a medical emergency, please immediately call 911 or go to the emergency department.  Pager Numbers  - Dr. Hester: [phone]  - Dr. Jackquline: [phone]  - Dr. Claudene: [phone]   In the event of inclement weather, please call our main line at [phone] for an update on the status of any delays or closures.  Dermatology Medication Tips: Please keep the boxes that topical medications come in in  order to help keep track of the instructions about where and how to use these. Pharmacies typically print the medication instructions only on the boxes and not directly on the medication tubes.   If your medication is too expensive, please contact our office at [phone] option 4 or send us  a message through MyChart.   We are unable to tell what your co-pay for medications will be in advance as this is different depending on your insurance coverage. However, we may be able to find a substitute medication at lower cost or fill out paperwork to get insurance to cover a needed medication.   If a prior authorization is required to get your medication covered by your insurance company, please allow us  1-2 business days to complete this process.  Drug prices often vary depending on where the prescription is filled and some pharmacies may offer cheaper prices.  The website www.goodrx.com contains coupons for medications through different pharmacies. The prices here do not account for what the cost may be with help from insurance (it may be cheaper with your insurance), but the website can give you the price if you did not use any insurance.  - You can print the associated coupon and take it with your prescription to the pharmacy.  - You may also stop by our office during regular business hours and pick up a GoodRx coupon card.  - If you need your prescription sent electronically to a different pharmacy, notify our office through [facility] or by phone at [phone] option 4.     Si Usted Necesita Algo Despus de Su Visita  Tambin puede enviarnos un mensaje a travs de Clinical cytogeneticist. Por lo general respondemos a los mensajes de MyChart en el transcurso de 1 a 2 das hbiles.  Para renovar recetas, por favor pida a su farmacia que se ponga en contacto con nuestra oficina. Randi lakes de fax es [place] [phone].  Si tiene un asunto urgente cuando la clnica est cerrada y que no puede  esperar hasta el siguiente da hbil, puede llamar/localizar a su doctor(a) al nmero que aparece a continuacin.   Por favor, tenga en cuenta que aunque hacemos todo lo posible para estar disponibles para asuntos urgentes fuera del horario de [place], no estamos disponibles las 24 horas del da, los 7 [address]  [address] de la [place].   Si tiene un problema urgente y no puede comunicarse con nosotros, puede optar por buscar atencin mdica  en el consultorio de su doctor(a), en una clnica privada, en un centro de atencin urgente o en una sala de emergencias.  Si tiene Engineer, drilling, por favor llame inmediatamente al 911 o vaya a la sala de emergencias.  Nmeros de bper  - Dr. Hester: [phone]  - Dra. Jackquline: [phone]  - Dr. Claudene: [phone]   En caso de inclemencias del tiempo, por favor llame a landry capes principal al [phone] para una actualizacin sobre el estado de cualquier  retraso o cierre.  Consejos para la medicacin en dermatologa: Por favor, guarde las cajas en las que vienen los medicamentos de uso tpico para ayudarle a seguir las instrucciones sobre dnde y cmo usarlos. Las farmacias generalmente imprimen las instrucciones del medicamento slo en las cajas y no directamente en los tubos del [place].   Si su medicamento es muy caro, por favor, pngase en contacto con landry rieger llamando al [phone] y presione la opcin 4 o envenos un mensaje a travs de Clinical cytogeneticist.   No podemos decirle cul ser su copago por los medicamentos por adelantado ya que esto es diferente dependiendo de la cobertura de su seguro. Sin embargo, es posible que podamos encontrar un medicamento sustituto a Audiological scientist un formulario para que el seguro cubra el medicamento que se considera necesario.   Si se requiere una autorizacin previa para que su compaa de seguros malta su medicamento, por favor permtanos de 1 a 2 das hbiles para completar este proceso.  Los  precios de los medicamentos varan con frecuencia dependiendo del Environmental consultant de dnde se surte la receta y alguna farmacias pueden ofrecer precios ms baratos.  El sitio web www.goodrx.com tiene cupones para medicamentos de Health and safety inspector. Los precios aqu no tienen en cuenta lo que podra costar con la ayuda del seguro (puede ser ms barato con su seguro), pero el sitio web puede darle el precio si no utiliz Tourist information centre manager.  - Puede imprimir el cupn correspondiente y llevarlo con su receta a la farmacia.  - Tambin puede pasar por nuestra oficina durante el horario de atencin regular y Education officer, museum una tarjeta de cupones de GoodRx.  - Si necesita que su receta se enve electrnicamente a una farmacia diferente, informe a nuestra oficina a travs de MyChart de [place] o por telfono llamando al [phone] y presione la opcin 4.

## 2023-09-19 ENCOUNTER — Ambulatory Visit: Payer: No Typology Code available for payment source

## 2023-09-19 NOTE — Therapy (Incomplete)
 OUTPATIENT PHYSICAL THERAPY NEURO TREATMENT       Patient Name: Michelle Ruiz MRN: [id number] DOB:11/12/1970, 53 y.o., female Today's Date: 09/19/2023   PCP: Sherial Bail  REFERRING PROVIDER: Maree Hila K  END OF SESSION:                     Past Medical History:  Diagnosis Date   Abnormal Pap smear of cervix    Actinic keratosis    Anxiety    Basal cell carcinoma 04/14/2008   Right upper ant. thigh. BCC with sclerosis.   Basal cell carcinoma 08/14/2008   Right lat. lower thigh. Superficial.    Basal cell carcinoma 04/29/2019   Right mid forearm. Superficial and nodular patterns. EDC.   Basal cell carcinoma 08/17/2020   Right upper antecubital. EDC.   Basal cell carcinoma 08/23/2021   Left Lower Leg, EDC   Kidney stones    MS (multiple sclerosis) (HCC)    UTI (urinary tract infection)    Past Surgical History:  Procedure Laterality Date   BREAST BIOPSY Right 2010   stereotactic biopsy/ neg/ dr.byrnett   BREAST BIOPSY Left 2012   neg/dr. brynett   CESAREAN SECTION  2008   RPH   TONSILLECTOMY  2006   There are no active problems to display for this patient.   ONSET DATE: diagnosed 12 years ago.   REFERRING DIAG: MS, gait and mobility   THERAPY DIAG:  No diagnosis found.  Rationale for Evaluation and Treatment: Rehabilitation  SUBJECTIVE:                                                                                                                                                                                             SUBJECTIVE STATEMENT:  ***  Pt accompanied by: self  PERTINENT HISTORY: Patient was seen by physical therapy until September of last year. Pmh includes MS, UTI, anxiety. Has extreme spasticity in LLE. Extreme cold and heat affect her LLE. Patient is now taking hormonal medication. Patient is a Chartered certified accountant.   PAIN:  Are you having pain? No  PRECAUTIONS: None  RED FLAGS: None   WEIGHT BEARING  RESTRICTIONS: No  FALLS: Has patient fallen in last 6 months? No  LIVING ENVIRONMENT: Lives with: lives with their family Lives in: House/apartment Stairs: no stairs to enter, lives on second floor Has following equipment at home: Single point cane and shower chair  PLOF: Independent  PATIENT GOALS: continue walking as long as she can.   OBJECTIVE:  Note: Objective measures were completed at Evaluation unless otherwise noted.  DIAGNOSTIC FINDINGS: n/a  COGNITION: Overall cognitive  status: Within functional limits for tasks assessed   SENSATION: WFL; slight decrease to RLE   COORDINATION: Heel slide: limited LLE    MUSCLE TONE: LLE: Mild and Moderate   POSTURE: No Significant postural limitations  LOWER EXTREMITY ROM:     WFL  LOWER EXTREMITY MMT:    MMT Right Eval Left Eval  Hip flexion 6.4 4.2  Hip extension    Hip abduction 7.9 3.4  Hip adduction 7.5 8.5  Hip internal rotation    Hip external rotation    Knee flexion 11.1 10.2  Knee extension 7.8 10  Ankle dorsiflexion 10.8 6.8  Ankle plantarflexion 7.2 6.9  Ankle inversion    Ankle eversion    (Blank rows = not tested)  BED MOBILITY:  Scooting into bed is very challenging at end of night  TRANSFERS: Assistive device utilized: Single point cane  Sit to stand: Complete Independence Stand to sit: Complete Independence Chair to chair: Complete Independence    STAIRS: Level of Assistance: CGA Stair Negotiation Technique: Alternating Pattern  with Single Rail on Right Number of Stairs: 5  Height of Stairs: 6 inch    GAIT: Gait pattern: step through pattern and poor foot clearance- Left Distance walked: 60 ft Assistive device utilized: None Level of assistance: CGA Comments: L foot scuffing  FUNCTIONAL TESTS:  5 times sit to stand: 13 seconds with arms crossed.  6 minute walk test: next session  Functional gait assessment: 14/30  PATIENT SURVEYS:  ABC scale 60.5%                                                                                                                                TREATMENT DATE: 09/19/23   TherAct Dynadisc L ankle PF/DF x10 each direction Dynadisc L ankle inversion/eversion x10 each direction Dynadisc L ankle circles x10 each direction   Toe taps over hedgehog x10 L LE  Lateral step-over orange hurdle in standing at support bar for increased foot clearance x10 each direction   In hallway:  Toss ball to PT with SPT guarding 73ft x4 trials lateral stepping- Side stepping to the R more difficult, cues provided to increase L hip flexion to prevent L foot drag    Neuro Re-ed: STS with Airex 2x10- pt reports difficulty as hard  Standing with CGA next to support surface:  Airex pad dual task with reaching and scanning with dual task x1 round  Ball toss to PT with pt standing on Airex pad: - Round 1: normal tossing x1 minute - Round 2: tossing with verbal recall task of naming animals x1 minute  Weighted ambulation (2# AW) in hallway with verbal recall task of naming foods in alphabetical order 150 ft x   PATIENT EDUCATION: Education details: Exercise technique; importance of daily stretching Person educated: Patient Education method: Explanation, Demonstration, Tactile cues, and Verbal cues Education comprehension: verbalized understanding, returned demonstration, verbal cues required, and tactile cues required   HOME  EXERCISE PROGRAM: Access Code: [email or web address] URL: [email or web address] Date: 08/13/2023 Prepared by: Reyes London  Exercises - Hooklying Single Knee to Chest Stretch  - 1-2 x daily - 3 sets - 30 sec  hold - Supine Lower Trunk Rotation  - 1-2 x daily - 3 sets - 30 sec hold - Supine Hamstring Stretch with Strap  - 1-2 x daily - 3 sets - 30 sec hold - Seated Hamstring Stretch  - 1-2 x daily - 3 sets - 30 sec hold - Supine Hip Internal and External Rotation  - 1-2 x daily - 3 sets - 10 reps - Seated Hip  External Rotation Stretch  - 1 x daily - 3 sets - 30 sec hold - Supine Figure 4 Piriformis Stretch  - 1 x daily - 3 sets - 30 sec hold       Access Code: [email or web address] URL: [email or web address] Date: 05/29/2023 Prepared by: Marina  Moser  Exercises - Towel Scrunches  - 1 x daily - 7 x weekly - 2 sets - 10 reps - 5 hold - Toe Spreading  - 1 x daily - 7 x weekly - 2 sets - 10 reps - 5 hold - Seated Ankle Alphabet  - 1 x daily - 7 x weekly - 2 sets - 1 reps - 5 hold  GOALS: Goals reviewed with patient? Yes  SHORT TERM GOALS: Target date: 05/22/2023  Patient will be independent in home exercise program to improve strength/mobility for better functional independence with ADLs. Baseline: 4/14: compliant  Goal status: MET     LONG TERM GOALS: Target date: 10/08/2023    Patient will increase Functional Gait Assessment score to >25/30 as to reduce fall risk and improve dynamic gait safety with community ambulation. Baseline: 1/28: 14/30  4/14: 17/30  6/11: 18/30 Goal status: Partially Met  2.   Patient (< 65 years old) will complete five times sit to stand test in < 10 seconds indicating an increased LE strength and improved balance. Baseline: 1/28: 13 seconds 4/14: 10.72 seconds no hands 6/11: 10 seconds no hands  Goal status: MET  3.  Patient will increase six minute walk test distance to >1000 for progression to community ambulator and improve gait ability Baseline: Perform next session 3/4: 820 ft 4/14: 890 ft with [facility]  6/11: 888 ft with SPC Goal status: Partially Met   4.  Patient will return to nightly ambulation's with less than five foot scuffs per ambulation to return to PLOF.  Baseline: 1/28: limited ambulation 4/14: some limitations pending on day. Encouraged to perform multiple short walks 6/11: started doing nightly walks ; decreased foot scuffs  Goal status: Partially Met   5. Patient will increase ABC scale score >80% to demonstrate better functional  mobility and better confidence with ADLs.  Baseline: 1/28: 60%  4/14: 65%  6/11: 68%  Goal status: Partially Met   ASSESSMENT:  CLINICAL IMPRESSION:    *** Pt will continue to benefit from skilled therapy to address remaining deficits in order to improve overall QoL and return to PLOF.     OBJECTIVE IMPAIRMENTS: Abnormal gait, decreased activity tolerance, decreased balance, decreased coordination, decreased endurance, decreased mobility, difficulty walking, decreased strength, impaired perceived functional ability, impaired flexibility, impaired UE functional use, and improper body mechanics.   ACTIVITY LIMITATIONS: carrying, lifting, bending, sitting, standing, squatting, stairs, transfers, bed mobility, dressing, reach over head, hygiene/grooming, locomotion level, and caring for others  PARTICIPATION LIMITATIONS: meal prep, cleaning, laundry, interpersonal relationship, driving, shopping, community activity, and occupation  PERSONAL FACTORS: Age, Fitness, Past/current experiences, Profession, Time since onset of injury/illness/exacerbation, and 1-2 comorbidities: MS anxiety are also affecting patient's functional outcome.   REHAB POTENTIAL: Good  CLINICAL DECISION MAKING: Evolving/moderate complexity  EVALUATION COMPLEXITY: Moderate  PLAN:  PT FREQUENCY: 2x/week  PT DURATION: 12 weeks  PLANNED INTERVENTIONS: 97164- PT Re-evaluation, 97110-Therapeutic exercises, 97530- Therapeutic activity, 97112- Neuromuscular re-education, 97535- Self Care, 02859- Manual therapy, [phone]- Gait training, [phone]- Orthotic Fit/training, [phone]- Canalith repositioning, Patient/Family education, Balance training, Stair training, Taping, Dry Needling, Joint mobilization, Joint manipulation, Spinal mobilization, Vestibular training, Visual/preceptual remediation/compensation, DME instructions, Cryotherapy, and Moist heat  PLAN FOR NEXT SESSION:  airex pad/balance, LLE strengthening, HEP , LLE  coordination. Adding cognitive/manual dual task with dynamic balance activities      Janell Axe, SPT  This entire session was performed under direct supervision and direction of a licensed therapist/therapist assistant . I have personally read, edited and approve of the note as written.  Marina  Leopoldo, PT, DPT Physical Therapist - [facility]  Outpatient Physical Therapy- Main Campus [phone]     09/19/23, 7:44 AM

## 2023-09-21 LAB — SURGICAL PATHOLOGY

## 2023-09-24 ENCOUNTER — Ambulatory Visit: Payer: No Typology Code available for payment source

## 2023-09-24 ENCOUNTER — Ambulatory Visit: Payer: Self-pay | Admitting: Dermatology

## 2023-09-24 ENCOUNTER — Encounter: Payer: Self-pay | Admitting: Dermatology

## 2023-09-24 DIAGNOSIS — R262 Difficulty in walking, not elsewhere classified: Secondary | ICD-10-CM

## 2023-09-24 DIAGNOSIS — M6281 Muscle weakness (generalized): Secondary | ICD-10-CM

## 2023-09-24 DIAGNOSIS — R2681 Unsteadiness on feet: Secondary | ICD-10-CM

## 2023-09-24 DIAGNOSIS — R269 Unspecified abnormalities of gait and mobility: Secondary | ICD-10-CM

## 2023-09-24 NOTE — Telephone Encounter (Signed)
-----   Message from Rexene Rattler sent at 09/24/2023  8:39 AM EDT ----- 1. Skin, L medial lower leg :       SUPERFICIAL BASAL CELL CARCINOMA  2. Skin, R spinal mid upper back :       ACTINIC KERATOSIS AND SEBORRHEIC KERATOSIS, LIMITED MARGINS FREE  BCC skin cancer- already treated with EDC at time of biopsy  Precancer and SK- removed with biopsy, observation  - please call patient ----- Message ----- From: Interface, Lab [facility] Sent: 09/21/2023   2:57 PM EDT To: Rexene Rattler, MD

## 2023-09-24 NOTE — Therapy (Signed)
 OUTPATIENT PHYSICAL THERAPY NEURO TREATMENT       Patient Name: Michelle Ruiz MRN: [id number] DOB:July 20, 1970, 53 y.o., female Today's Date: 09/24/2023   PCP: Sherial Bail  REFERRING PROVIDER: Maree Hila K  END OF SESSION:  PT End of Session - 09/24/23 1314     Visit Number 24    Number of Visits 36    Date for PT Re-Evaluation 10/08/23    Progress Note Due on Visit 70    PT Start Time 1315    PT Stop Time 1357    PT Time Calculation (min) 42 min    Equipment Utilized During Treatment Gait belt    Activity Tolerance Patient tolerated treatment well    Behavior During Therapy WFL for tasks assessed/performed                            Past Medical History:  Diagnosis Date   Abnormal Pap smear of cervix    Actinic keratosis 09/17/2023   R spinal mid upper back   Anxiety    Basal cell carcinoma 04/14/2008   Right upper ant. thigh. BCC with sclerosis.   Basal cell carcinoma 08/14/2008   Right lat. lower thigh. Superficial.    Basal cell carcinoma 04/29/2019   Right mid forearm. Superficial and nodular patterns. EDC.   Basal cell carcinoma 08/17/2020   Right upper antecubital. EDC.   Basal cell carcinoma 08/23/2021   Left Lower Leg, EDC   Basal cell carcinoma 09/17/2023   L medial lower leg, EDC   Kidney stones    MS (multiple sclerosis) (HCC)    UTI (urinary tract infection)    Past Surgical History:  Procedure Laterality Date   BREAST BIOPSY Right 2010   stereotactic biopsy/ neg/ dr.byrnett   BREAST BIOPSY Left 2012   neg/dr. brynett   CESAREAN SECTION  2008   RPH   TONSILLECTOMY  2006   There are no active problems to display for this patient.   ONSET DATE: diagnosed 12 years ago.   REFERRING DIAG: MS, gait and mobility   THERAPY DIAG:  Difficulty in walking, not elsewhere classified  Abnormality of gait and mobility  Unsteadiness on feet  Muscle weakness (generalized)  Rationale for Evaluation and Treatment:  Rehabilitation  SUBJECTIVE:                                                                                                                                                                                             SUBJECTIVE STATEMENT:  Pt reports that she had a good weekend. Pt went on a walk  last night and couldn't make it because it was too far.   Pt accompanied by: self  PERTINENT HISTORY: Patient was seen by physical therapy until September of last year. Pmh includes MS, UTI, anxiety. Has extreme spasticity in LLE. Extreme cold and heat affect her LLE. Patient is now taking hormonal medication. Patient is a Chartered certified accountant.   PAIN:  Are you having pain? No  PRECAUTIONS: None  RED FLAGS: None   WEIGHT BEARING RESTRICTIONS: No  FALLS: Has patient fallen in last 6 months? No  LIVING ENVIRONMENT: Lives with: lives with their family Lives in: House/apartment Stairs: no stairs to enter, lives on second floor Has following equipment at home: Single point cane and shower chair  PLOF: Independent  PATIENT GOALS: continue walking as long as she can.   OBJECTIVE:  Note: Objective measures were completed at Evaluation unless otherwise noted.  DIAGNOSTIC FINDINGS: n/a  COGNITION: Overall cognitive status: Within functional limits for tasks assessed   SENSATION: WFL; slight decrease to RLE   COORDINATION: Heel slide: limited LLE    MUSCLE TONE: LLE: Mild and Moderate   POSTURE: No Significant postural limitations  LOWER EXTREMITY ROM:     WFL  LOWER EXTREMITY MMT:    MMT Right Eval Left Eval  Hip flexion 6.4 4.2  Hip extension    Hip abduction 7.9 3.4  Hip adduction 7.5 8.5  Hip internal rotation    Hip external rotation    Knee flexion 11.1 10.2  Knee extension 7.8 10  Ankle dorsiflexion 10.8 6.8  Ankle plantarflexion 7.2 6.9  Ankle inversion    Ankle eversion    (Blank rows = not tested)  BED MOBILITY:  Scooting into bed is very challenging  at end of night  TRANSFERS: Assistive device utilized: Single point cane  Sit to stand: Complete Independence Stand to sit: Complete Independence Chair to chair: Complete Independence    STAIRS: Level of Assistance: CGA Stair Negotiation Technique: Alternating Pattern  with Single Rail on Right Number of Stairs: 5  Height of Stairs: 6 inch    GAIT: Gait pattern: step through pattern and poor foot clearance- Left Distance walked: 60 ft Assistive device utilized: None Level of assistance: CGA Comments: L foot scuffing  FUNCTIONAL TESTS:  5 times sit to stand: 13 seconds with arms crossed.  6 minute walk test: next session  Functional gait assessment: 14/30  PATIENT SURVEYS:  ABC scale 60.5%                                                                                                                               TREATMENT DATE: 09/24/23   TherAct Dynadisc L ankle PF/DF x10 each direction Dynadisc L ankle inversion/eversion x10 each direction Dynadisc L ankle circles x10 each direction  Lateral step-over orange hurdle in standing at support bar for increased foot clearance x10 each direction   In hallway:  Toss ball to PT with SPT guarding 43ft x4  trials forward/backward     Neuro Re-ed: STS with Airex x10- pt reports difficulty as hard  Ball toss to PT with pt standing on Airex pad: - Round 1: normal tossing x1 minute - Round 2: tossing with verbal recall task of naming desserts x1 minute  Weighted ambulation (2# AW) in hallway with verbal recall task of naming foods in alphabetical order 25ft x 6 laps- pt able to complete alphabet with minimal cueing   PATIENT EDUCATION: Education details: Exercise technique; importance of daily stretching Person educated: Patient Education method: Explanation, Demonstration, Tactile cues, and Verbal cues Education comprehension: verbalized understanding, returned demonstration, verbal cues required, and tactile cues  required   HOME EXERCISE PROGRAM: Access Code: [email or web address] URL: [email or web address] Date: 08/13/2023 Prepared by: Reyes London  Exercises - Hooklying Single Knee to Chest Stretch  - 1-2 x daily - 3 sets - 30 sec  hold - Supine Lower Trunk Rotation  - 1-2 x daily - 3 sets - 30 sec hold - Supine Hamstring Stretch with Strap  - 1-2 x daily - 3 sets - 30 sec hold - Seated Hamstring Stretch  - 1-2 x daily - 3 sets - 30 sec hold - Supine Hip Internal and External Rotation  - 1-2 x daily - 3 sets - 10 reps - Seated Hip External Rotation Stretch  - 1 x daily - 3 sets - 30 sec hold - Supine Figure 4 Piriformis Stretch  - 1 x daily - 3 sets - 30 sec hold       Access Code: [email or web address] URL: [email or web address] Date: 05/29/2023 Prepared by: Marina  Moser  Exercises - Towel Scrunches  - 1 x daily - 7 x weekly - 2 sets - 10 reps - 5 hold - Toe Spreading  - 1 x daily - 7 x weekly - 2 sets - 10 reps - 5 hold - Seated Ankle Alphabet  - 1 x daily - 7 x weekly - 2 sets - 1 reps - 5 hold  GOALS: Goals reviewed with patient? Yes  SHORT TERM GOALS: Target date: 05/22/2023  Patient will be independent in home exercise program to improve strength/mobility for better functional independence with ADLs. Baseline: 4/14: compliant  Goal status: MET     LONG TERM GOALS: Target date: 10/08/2023    Patient will increase Functional Gait Assessment score to >25/30 as to reduce fall risk and improve dynamic gait safety with community ambulation. Baseline: 1/28: 14/30  4/14: 17/30  6/11: 18/30 Goal status: Partially Met  2.   Patient (< 15 years old) will complete five times sit to stand test in < 10 seconds indicating an increased LE strength and improved balance. Baseline: 1/28: 13 seconds 4/14: 10.72 seconds no hands 6/11: 10 seconds no hands  Goal status: MET  3.  Patient will increase six minute walk test distance to >1000 for progression to community ambulator  and improve gait ability Baseline: Perform next session 3/4: 820 ft 4/14: 890 ft with [facility]  6/11: 888 ft with SPC Goal status: Partially Met   4.  Patient will return to nightly ambulation's with less than five foot scuffs per ambulation to return to PLOF.  Baseline: 1/28: limited ambulation 4/14: some limitations pending on day. Encouraged to perform multiple short walks 6/11: started doing nightly walks ; decreased foot scuffs  Goal status: Partially Met   5. Patient will increase ABC scale score >80% to demonstrate better functional mobility and better confidence with ADLs.  Baseline: 1/28: 60%  4/14: 65%  6/11: 68%  Goal status: Partially Met   ASSESSMENT:  CLINICAL IMPRESSION:    Pt tolerated dual-task activities well during session today. Pt demonstrated less instances of L foot drop/poor L foot clearance today and was able to perform side-stepping over hurdle without L foot catching. Pt demonstrated mild decreased cadence and L step length when challenged with dual-task and backward walking. Pt remained motivated throughout session to improve her mobility and walking capacity. Pt will continue to benefit from skilled therapy to address remaining deficits in order to improve overall QoL and return to PLOF.     OBJECTIVE IMPAIRMENTS: Abnormal gait, decreased activity tolerance, decreased balance, decreased coordination, decreased endurance, decreased mobility, difficulty walking, decreased strength, impaired perceived functional ability, impaired flexibility, impaired UE functional use, and improper body mechanics.   ACTIVITY LIMITATIONS: carrying, lifting, bending, sitting, standing, squatting, stairs, transfers, bed mobility, dressing, reach over head, hygiene/grooming, locomotion level, and caring for others  PARTICIPATION LIMITATIONS: meal prep, cleaning, laundry, interpersonal relationship, driving, shopping, community activity, and occupation  PERSONAL FACTORS: Age, Fitness,  Past/current experiences, Profession, Time since onset of injury/illness/exacerbation, and 1-2 comorbidities: MS anxiety are also affecting patient's functional outcome.   REHAB POTENTIAL: Good  CLINICAL DECISION MAKING: Evolving/moderate complexity  EVALUATION COMPLEXITY: Moderate  PLAN:  PT FREQUENCY: 2x/week  PT DURATION: 12 weeks  PLANNED INTERVENTIONS: 97164- PT Re-evaluation, 97110-Therapeutic exercises, 97530- Therapeutic activity, 97112- Neuromuscular re-education, 97535- Self Care, 02859- Manual therapy, [phone]- Gait training, [phone]- Orthotic Fit/training, [phone]- Canalith repositioning, Patient/Family education, Balance training, Stair training, Taping, Dry Needling, Joint mobilization, Joint manipulation, Spinal mobilization, Vestibular training, Visual/preceptual remediation/compensation, DME instructions, Cryotherapy, and Moist heat  PLAN FOR NEXT SESSION:  airex pad/balance, LLE strengthening, HEP , LLE coordination. Adding cognitive/manual dual task with dynamic balance activities      Janell Axe, SPT  This entire session was performed under direct supervision and direction of a licensed therapist/therapist assistant . I have personally read, edited and approve of the note as written.  Marina  Leopoldo, PT, DPT Physical Therapist - [facility]  Outpatient Physical Therapy- Main Campus [phone]     09/24/23, 3:44 PM

## 2023-09-24 NOTE — Telephone Encounter (Signed)
 Left pt msg to call for bx results/sh

## 2023-09-26 ENCOUNTER — Ambulatory Visit: Payer: No Typology Code available for payment source | Attending: Neurology

## 2023-09-26 DIAGNOSIS — R269 Unspecified abnormalities of gait and mobility: Secondary | ICD-10-CM | POA: Insufficient documentation

## 2023-09-26 DIAGNOSIS — M6281 Muscle weakness (generalized): Secondary | ICD-10-CM | POA: Insufficient documentation

## 2023-09-26 DIAGNOSIS — R262 Difficulty in walking, not elsewhere classified: Secondary | ICD-10-CM | POA: Insufficient documentation

## 2023-09-26 DIAGNOSIS — Z9181 History of falling: Secondary | ICD-10-CM | POA: Diagnosis present

## 2023-09-26 DIAGNOSIS — R2681 Unsteadiness on feet: Secondary | ICD-10-CM | POA: Insufficient documentation

## 2023-09-26 NOTE — Telephone Encounter (Signed)
 Patient returned call with message left on nurse line. Patient gave OK to leave detailed message on her VM.  Left details of BX result and if patient had any questions to return our call. aw

## 2023-09-26 NOTE — Therapy (Signed)
 OUTPATIENT PHYSICAL THERAPY NEURO TREATMENT       Patient Name: Michelle Ruiz MRN: [id number] DOB:1970/08/15, 53 y.o., female Today's Date: 09/26/2023   PCP: Sherial Bail  REFERRING PROVIDER: Maree Hila K  END OF SESSION:  PT End of Session - 09/26/23 1401     Visit Number 25    Number of Visits 36    Date for PT Re-Evaluation 10/08/23    Progress Note Due on Visit 70    PT Start Time 1400    PT Stop Time 1441    PT Time Calculation (min) 41 min    Equipment Utilized During Treatment Gait belt    Activity Tolerance Patient tolerated treatment well    Behavior During Therapy WFL for tasks assessed/performed                             Past Medical History:  Diagnosis Date   Abnormal Pap smear of cervix    Actinic keratosis 09/17/2023   R spinal mid upper back   Anxiety    Basal cell carcinoma 04/14/2008   Right upper ant. thigh. BCC with sclerosis.   Basal cell carcinoma 08/14/2008   Right lat. lower thigh. Superficial.    Basal cell carcinoma 04/29/2019   Right mid forearm. Superficial and nodular patterns. EDC.   Basal cell carcinoma 08/17/2020   Right upper antecubital. EDC.   Basal cell carcinoma 08/23/2021   Left Lower Leg, EDC   Basal cell carcinoma 09/17/2023   L medial lower leg, EDC   Kidney stones    MS (multiple sclerosis) (HCC)    UTI (urinary tract infection)    Past Surgical History:  Procedure Laterality Date   BREAST BIOPSY Right 2010   stereotactic biopsy/ neg/ dr.byrnett   BREAST BIOPSY Left 2012   neg/dr. brynett   CESAREAN SECTION  2008   RPH   TONSILLECTOMY  2006   There are no active problems to display for this patient.   ONSET DATE: diagnosed 12 years ago.   REFERRING DIAG: MS, gait and mobility   THERAPY DIAG:  Difficulty in walking, not elsewhere classified  Abnormality of gait and mobility  Unsteadiness on feet  Muscle weakness (generalized)  Rationale for Evaluation and Treatment:  Rehabilitation  SUBJECTIVE:                                                                                                                                                                                             SUBJECTIVE STATEMENT:  Pt reports she is feeling good today. Pt says she felt a  little sore after   Pt accompanied by: self  PERTINENT HISTORY: Patient was seen by physical therapy until September of last year. Pmh includes MS, UTI, anxiety. Has extreme spasticity in LLE. Extreme cold and heat affect her LLE. Patient is now taking hormonal medication. Patient is a Chartered certified accountant.   PAIN:  Are you having pain? No  PRECAUTIONS: None  RED FLAGS: None   WEIGHT BEARING RESTRICTIONS: No  FALLS: Has patient fallen in last 6 months? No  LIVING ENVIRONMENT: Lives with: lives with their family Lives in: House/apartment Stairs: no stairs to enter, lives on second floor Has following equipment at home: Single point cane and shower chair  PLOF: Independent  PATIENT GOALS: continue walking as long as she can.   OBJECTIVE:  Note: Objective measures were completed at Evaluation unless otherwise noted.  DIAGNOSTIC FINDINGS: n/a  COGNITION: Overall cognitive status: Within functional limits for tasks assessed   SENSATION: WFL; slight decrease to RLE   COORDINATION: Heel slide: limited LLE    MUSCLE TONE: LLE: Mild and Moderate   POSTURE: No Significant postural limitations  LOWER EXTREMITY ROM:     WFL  LOWER EXTREMITY MMT:    MMT Right Eval Left Eval  Hip flexion 6.4 4.2  Hip extension    Hip abduction 7.9 3.4  Hip adduction 7.5 8.5  Hip internal rotation    Hip external rotation    Knee flexion 11.1 10.2  Knee extension 7.8 10  Ankle dorsiflexion 10.8 6.8  Ankle plantarflexion 7.2 6.9  Ankle inversion    Ankle eversion    (Blank rows = not tested)  BED MOBILITY:  Scooting into bed is very challenging at end of  night  TRANSFERS: Assistive device utilized: Single point cane  Sit to stand: Complete Independence Stand to sit: Complete Independence Chair to chair: Complete Independence    STAIRS: Level of Assistance: CGA Stair Negotiation Technique: Alternating Pattern  with Single Rail on Right Number of Stairs: 5  Height of Stairs: 6 inch    GAIT: Gait pattern: step through pattern and poor foot clearance- Left Distance walked: 60 ft Assistive device utilized: None Level of assistance: CGA Comments: L foot scuffing  FUNCTIONAL TESTS:  5 times sit to stand: 13 seconds with arms crossed.  6 minute walk test: next session  Functional gait assessment: 14/30  PATIENT SURVEYS:  ABC scale 60.5%                                                                                                                               TREATMENT DATE: 09/26/23   TherAct Dynadisc L ankle PF/DF x10 each direction Dynadisc L ankle inversion/eversion x10 each direction Dynadisc L ankle circles x10 each direction Adduction squeezes with rainbow ball x multiple reps  Lateral step-over orange hurdle in standing at support bar for increased foot clearance x10 each direction     Neuro Re-ed: Weighted ambulation (2# AW) in hallway forward and  retrograde 65ft x 2 each- pt rates difficulty as medium  Walking with balancing object (don't drop the ice cream) for manual dual-tasking 22ft x 2 each- pt rates difficulty as medium  Kore balance penguin race twisty slope for ankle righting strategy x 2 rounds    PATIENT EDUCATION: Education details: Exercise technique; importance of daily stretching Person educated: Patient Education method: Explanation, Demonstration, Tactile cues, and Verbal cues Education comprehension: verbalized understanding, returned demonstration, verbal cues required, and tactile cues required   HOME EXERCISE PROGRAM: Access Code: [email or web address] URL:  [email or web address] Date: 08/13/2023 Prepared by: Reyes London  Exercises - Hooklying Single Knee to Chest Stretch  - 1-2 x daily - 3 sets - 30 sec  hold - Supine Lower Trunk Rotation  - 1-2 x daily - 3 sets - 30 sec hold - Supine Hamstring Stretch with Strap  - 1-2 x daily - 3 sets - 30 sec hold - Seated Hamstring Stretch  - 1-2 x daily - 3 sets - 30 sec hold - Supine Hip Internal and External Rotation  - 1-2 x daily - 3 sets - 10 reps - Seated Hip External Rotation Stretch  - 1 x daily - 3 sets - 30 sec hold - Supine Figure 4 Piriformis Stretch  - 1 x daily - 3 sets - 30 sec hold       Access Code: [email or web address] URL: [email or web address] Date: 05/29/2023 Prepared by: Marina  Moser  Exercises - Towel Scrunches  - 1 x daily - 7 x weekly - 2 sets - 10 reps - 5 hold - Toe Spreading  - 1 x daily - 7 x weekly - 2 sets - 10 reps - 5 hold - Seated Ankle Alphabet  - 1 x daily - 7 x weekly - 2 sets - 1 reps - 5 hold  GOALS: Goals reviewed with patient? Yes  SHORT TERM GOALS: Target date: 05/22/2023  Patient will be independent in home exercise program to improve strength/mobility for better functional independence with ADLs. Baseline: 4/14: compliant  Goal status: MET     LONG TERM GOALS: Target date: 10/08/2023    Patient will increase Functional Gait Assessment score to >25/30 as to reduce fall risk and improve dynamic gait safety with community ambulation. Baseline: 1/28: 14/30  4/14: 17/30  6/11: 18/30 Goal status: Partially Met  2.   Patient (< 70 years old) will complete five times sit to stand test in < 10 seconds indicating an increased LE strength and improved balance. Baseline: 1/28: 13 seconds 4/14: 10.72 seconds no hands 6/11: 10 seconds no hands  Goal status: MET  3.  Patient will increase six minute walk test distance to >1000 for progression to community ambulator and improve gait ability Baseline: Perform next session 3/4: 820  ft 4/14: 890 ft with [facility]  6/11: 888 ft with SPC Goal status: Partially Met   4.  Patient will return to nightly ambulation's with less than five foot scuffs per ambulation to return to PLOF.  Baseline: 1/28: limited ambulation 4/14: some limitations pending on day. Encouraged to perform multiple short walks 6/11: started doing nightly walks ; decreased foot scuffs  Goal status: Partially Met   5. Patient will increase ABC scale score >80% to demonstrate better functional mobility and better confidence with ADLs.  Baseline: 1/28: 60%  4/14: 65%  6/11: 68%  Goal status: Partially Met   ASSESSMENT:  CLINICAL IMPRESSION:    Pt tolerated session well today and was able to improve backward ambulation tolerance  today. Pt's L foot demonstrated excellent foot clearance today with minimal instances of catching throughout session. Pt remains motivated to participate and improve her tolerance to ambulation. Pt will continue to benefit from skilled therapy to address remaining deficits in order to improve overall QoL and return to PLOF.     OBJECTIVE IMPAIRMENTS: Abnormal gait, decreased activity tolerance, decreased balance, decreased coordination, decreased endurance, decreased mobility, difficulty walking, decreased strength, impaired perceived functional ability, impaired flexibility, impaired UE functional use, and improper body mechanics.   ACTIVITY LIMITATIONS: carrying, lifting, bending, sitting, standing, squatting, stairs, transfers, bed mobility, dressing, reach over head, hygiene/grooming, locomotion level, and caring for others  PARTICIPATION LIMITATIONS: meal prep, cleaning, laundry, interpersonal relationship, driving, shopping, community activity, and occupation  PERSONAL FACTORS: Age, Fitness, Past/current experiences, Profession, Time since onset of injury/illness/exacerbation, and 1-2 comorbidities: MS anxiety are also affecting patient's functional outcome.   REHAB POTENTIAL:  Good  CLINICAL DECISION MAKING: Evolving/moderate complexity  EVALUATION COMPLEXITY: Moderate  PLAN:  PT FREQUENCY: 2x/week  PT DURATION: 12 weeks  PLANNED INTERVENTIONS: 97164- PT Re-evaluation, 97110-Therapeutic exercises, 97530- Therapeutic activity, 97112- Neuromuscular re-education, 97535- Self Care, 02859- Manual therapy, [phone]- Gait training, [phone]- Orthotic Fit/training, [phone]- Canalith repositioning, Patient/Family education, Balance training, Stair training, Taping, Dry Needling, Joint mobilization, Joint manipulation, Spinal mobilization, Vestibular training, Visual/preceptual remediation/compensation, DME instructions, Cryotherapy, and Moist heat  PLAN FOR NEXT SESSION:  airex pad/balance, LLE strengthening, HEP , LLE coordination. Adding cognitive/manual dual task with dynamic balance activities      Janell Axe, SPT  This entire session was performed under direct supervision and direction of a licensed therapist/therapist assistant . I have personally read, edited and approve of the note as written.  Marina  Leopoldo PT, DPT Physical Therapist - [facility]  Outpatient Physical Therapy- [facility] [phone]     09/26/23, 3:33 PM

## 2023-09-26 NOTE — Telephone Encounter (Signed)
 Advised patient of biopsy results, no further treatment needed. Will recheck at next appt.

## 2023-10-01 ENCOUNTER — Ambulatory Visit: Payer: No Typology Code available for payment source

## 2023-10-01 NOTE — Therapy (Incomplete)
 OUTPATIENT PHYSICAL THERAPY NEURO TREATMENT       Patient Name: Michelle Ruiz MRN: [id number] DOB:[birth year], 53 y.o., female Today's Date: 10/01/2023   PCP: Sherial Bail  REFERRING PROVIDER: Maree Hila K  END OF SESSION:                       Past Medical History:  Diagnosis Date   Abnormal Pap smear of cervix    Actinic keratosis 09/17/2023   R spinal mid upper back   Anxiety    Basal cell carcinoma 04/14/2008   Right upper ant. thigh. BCC with sclerosis.   Basal cell carcinoma 08/14/2008   Right lat. lower thigh. Superficial.    Basal cell carcinoma 04/29/2019   Right mid forearm. Superficial and nodular patterns. EDC.   Basal cell carcinoma 08/17/2020   Right upper antecubital. EDC.   Basal cell carcinoma 08/23/2021   Left Lower Leg, EDC   Basal cell carcinoma 09/17/2023   L medial lower leg, EDC   Kidney stones    MS (multiple sclerosis) (HCC)    UTI (urinary tract infection)    Past Surgical History:  Procedure Laterality Date   BREAST BIOPSY Right 2010   stereotactic biopsy/ neg/ dr.byrnett   BREAST BIOPSY Left 2012   neg/dr. brynett   CESAREAN SECTION  2008   RPH   TONSILLECTOMY  2006   There are no active problems to display for this patient.   ONSET DATE: diagnosed 12 years ago.   REFERRING DIAG: MS, gait and mobility   THERAPY DIAG:  No diagnosis found.  Rationale for Evaluation and Treatment: Rehabilitation  SUBJECTIVE:                                                                                                                                                                                             SUBJECTIVE STATEMENT:   ***  Pt accompanied by: self  PERTINENT HISTORY: Patient was seen by physical therapy until September of last year. Pmh includes MS, UTI, anxiety. Has extreme spasticity in LLE. Extreme cold and heat affect her LLE. Patient is now taking hormonal medication. Patient is a Proofreader.   PAIN:  Are you having pain? No  PRECAUTIONS: None  RED FLAGS: None   WEIGHT BEARING RESTRICTIONS: No  FALLS: Has patient fallen in last 6 months? No  LIVING ENVIRONMENT: Lives with: lives with their family Lives in: House/apartment Stairs: no stairs to enter, lives on second floor Has following equipment at home: Single point cane and shower chair  PLOF: Independent  PATIENT GOALS: continue walking as long as she  can.   OBJECTIVE:  Note: Objective measures were completed at Evaluation unless otherwise noted.  DIAGNOSTIC FINDINGS: n/a  COGNITION: Overall cognitive status: Within functional limits for tasks assessed   SENSATION: WFL; slight decrease to RLE   COORDINATION: Heel slide: limited LLE    MUSCLE TONE: LLE: Mild and Moderate   POSTURE: No Significant postural limitations  LOWER EXTREMITY ROM:     WFL  LOWER EXTREMITY MMT:    MMT Right Eval Left Eval  Hip flexion 6.4 4.2  Hip extension    Hip abduction 7.9 3.4  Hip adduction 7.5 8.5  Hip internal rotation    Hip external rotation    Knee flexion 11.1 10.2  Knee extension 7.8 10  Ankle dorsiflexion 10.8 6.8  Ankle plantarflexion 7.2 6.9  Ankle inversion    Ankle eversion    (Blank rows = not tested)  BED MOBILITY:  Scooting into bed is very challenging at end of night  TRANSFERS: Assistive device utilized: Single point cane  Sit to stand: Complete Independence Stand to sit: Complete Independence Chair to chair: Complete Independence    STAIRS: Level of Assistance: CGA Stair Negotiation Technique: Alternating Pattern  with Single Rail on Right Number of Stairs: 5  Height of Stairs: 6 inch    GAIT: Gait pattern: step through pattern and poor foot clearance- Left Distance walked: 60 ft Assistive device utilized: None Level of assistance: CGA Comments: L foot scuffing  FUNCTIONAL TESTS:  5 times sit to stand: 13 seconds with arms crossed.  6 minute walk test:  next session  Functional gait assessment: 14/30  PATIENT SURVEYS:  ABC scale 60.5%                                                                                                                               TREATMENT DATE: 10/01/23   ***  TherAct Dynadisc L ankle PF/DF x10 each direction Dynadisc L ankle inversion/eversion x10 each direction Dynadisc L ankle circles x10 each direction Adduction squeezes with rainbow ball x multiple reps  Lateral step-over orange hurdle in standing at support bar for increased foot clearance x10 each direction     Neuro Re-ed: Weighted ambulation (2# AW) in hallway forward and retrograde 34ft x 2 each- pt rates difficulty as medium  Walking with balancing object (don't drop the ice cream) for manual dual-tasking 41ft x 2 each- pt rates difficulty as medium  Kore balance penguin race twisty slope for ankle righting strategy x 2 rounds    PATIENT EDUCATION: Education details: Exercise technique; importance of daily stretching Person educated: Patient Education method: Explanation, Demonstration, Tactile cues, and Verbal cues Education comprehension: verbalized understanding, returned demonstration, verbal cues required, and tactile cues required   HOME EXERCISE PROGRAM: Access Code: [email or web address] URL: [email or web address] Date: 08/13/2023 Prepared by: Reyes London  Exercises - Hooklying Single Knee to Chest Stretch  - 1-2 x daily - 3 sets - 30 sec  hold - Supine Lower  Trunk Rotation  - 1-2 x daily - 3 sets - 30 sec hold - Supine Hamstring Stretch with Strap  - 1-2 x daily - 3 sets - 30 sec hold - Seated Hamstring Stretch  - 1-2 x daily - 3 sets - 30 sec hold - Supine Hip Internal and External Rotation  - 1-2 x daily - 3 sets - 10 reps - Seated Hip External Rotation Stretch  - 1 x daily - 3 sets - 30 sec hold - Supine Figure 4 Piriformis Stretch  - 1 x daily - 3 sets - 30 sec hold  Access Code: [email or web address] URL:  [email or web address] Date: 05/29/2023 Prepared by: Marina  Moser  Exercises - Towel Scrunches  - 1 x daily - 7 x weekly - 2 sets - 10 reps - 5 hold - Toe Spreading  - 1 x daily - 7 x weekly - 2 sets - 10 reps - 5 hold - Seated Ankle Alphabet  - 1 x daily - 7 x weekly - 2 sets - 1 reps - 5 hold  GOALS: Goals reviewed with patient? Yes  SHORT TERM GOALS: Target date: 05/22/2023  Patient will be independent in home exercise program to improve strength/mobility for better functional independence with ADLs. Baseline: 4/14: compliant  Goal status: MET     LONG TERM GOALS: Target date: 10/08/2023  Patient will increase Functional Gait Assessment score to >25/30 as to reduce fall risk and improve dynamic gait safety with community ambulation. Baseline: 1/28: 14/30  4/14: 17/30  6/11: 18/30 Goal status: Partially Met  2.   Patient (< 59 years old) will complete five times sit to stand test in < 10 seconds indicating an increased LE strength and improved balance. Baseline: 1/28: 13 seconds 4/14: 10.72 seconds no hands 6/11: 10 seconds no hands  Goal status: MET  3.  Patient will increase six minute walk test distance to >1000 for progression to community ambulator and improve gait ability Baseline: Perform next session 3/4: 820 ft 4/14: 890 ft with [facility]  6/11: 888 ft with SPC Goal status: Partially Met   4.  Patient will return to nightly ambulation's with less than five foot scuffs per ambulation to return to PLOF.  Baseline: 1/28: limited ambulation 4/14: some limitations pending on day. Encouraged to perform multiple short walks 6/11: started doing nightly walks ; decreased foot scuffs  Goal status: Partially Met   5. Patient will increase ABC scale score >80% to demonstrate better functional mobility and better confidence with ADLs.  Baseline: 1/28: 60%  4/14: 65%  6/11: 68%  Goal status: Partially Met   ASSESSMENT:  CLINICAL IMPRESSION:    ***   OBJECTIVE  IMPAIRMENTS: Abnormal gait, decreased activity tolerance, decreased balance, decreased coordination, decreased endurance, decreased mobility, difficulty walking, decreased strength, impaired perceived functional ability, impaired flexibility, impaired UE functional use, and improper body mechanics.   ACTIVITY LIMITATIONS: carrying, lifting, bending, sitting, standing, squatting, stairs, transfers, bed mobility, dressing, reach over head, hygiene/grooming, locomotion level, and caring for others  PARTICIPATION LIMITATIONS: meal prep, cleaning, laundry, interpersonal relationship, driving, shopping, community activity, and occupation  PERSONAL FACTORS: Age, Fitness, Past/current experiences, Profession, Time since onset of injury/illness/exacerbation, and 1-2 comorbidities: MS anxiety are also affecting patient's functional outcome.   REHAB POTENTIAL: Good  CLINICAL DECISION MAKING: Evolving/moderate complexity  EVALUATION COMPLEXITY: Moderate  PLAN:  PT FREQUENCY: 2x/week  PT DURATION: 12 weeks  PLANNED INTERVENTIONS: 97164- PT Re-evaluation, 97110-Therapeutic exercises, 97530- Therapeutic activity, [id number]- Neuromuscular re-education, 97535- Self Care, 02859- Manual  therapy, [phone]- Gait training, 02239- Orthotic Fit/training, 04007- Canalith repositioning, Patient/Family education, Balance training, Stair training, Taping, Dry Needling, Joint mobilization, Joint manipulation, Spinal mobilization, Vestibular training, Visual/preceptual remediation/compensation, DME instructions, Cryotherapy, and Moist heat  PLAN FOR NEXT SESSION:   *** airex pad/balance, LLE strengthening, HEP , LLE coordination. Adding cognitive/manual dual task with dynamic balance activities      Fonda Simpers, PT, DPT Physical Therapist - [facility]  [facility] [place] [facility]  10/01/23, 9:16 AM

## 2023-10-03 ENCOUNTER — Ambulatory Visit: Payer: No Typology Code available for payment source

## 2023-10-03 DIAGNOSIS — R262 Difficulty in walking, not elsewhere classified: Secondary | ICD-10-CM

## 2023-10-03 DIAGNOSIS — R2681 Unsteadiness on feet: Secondary | ICD-10-CM

## 2023-10-03 DIAGNOSIS — R269 Unspecified abnormalities of gait and mobility: Secondary | ICD-10-CM

## 2023-10-03 DIAGNOSIS — M6281 Muscle weakness (generalized): Secondary | ICD-10-CM

## 2023-10-03 NOTE — Therapy (Signed)
 OUTPATIENT PHYSICAL THERAPY NEURO TREATMENT       Patient Name: Michelle Ruiz MRN: [id number] DOB:Aug 02, 1970, 53 y.o., female Today's Date: 10/03/2023   PCP: Sherial Bail  REFERRING PROVIDER: Maree Hila K  END OF SESSION:  PT End of Session - 10/03/23 1359     Visit Number 26    Number of Visits 36    Date for PT Re-Evaluation 10/08/23    Progress Note Due on Visit 70    PT Start Time 1400    PT Stop Time 1440    PT Time Calculation (min) 40 min    Equipment Utilized During Treatment Gait belt    Activity Tolerance Patient tolerated treatment well    Behavior During Therapy WFL for tasks assessed/performed                              Past Medical History:  Diagnosis Date   Abnormal Pap smear of cervix    Actinic keratosis 09/17/2023   R spinal mid upper back   Anxiety    Basal cell carcinoma 04/14/2008   Right upper ant. thigh. BCC with sclerosis.   Basal cell carcinoma 08/14/2008   Right lat. lower thigh. Superficial.    Basal cell carcinoma 04/29/2019   Right mid forearm. Superficial and nodular patterns. EDC.   Basal cell carcinoma 08/17/2020   Right upper antecubital. EDC.   Basal cell carcinoma 08/23/2021   Left Lower Leg, EDC   Basal cell carcinoma 09/17/2023   L medial lower leg, EDC   Kidney stones    MS (multiple sclerosis) (HCC)    UTI (urinary tract infection)    Past Surgical History:  Procedure Laterality Date   BREAST BIOPSY Right 2010   stereotactic biopsy/ neg/ dr.byrnett   BREAST BIOPSY Left 2012   neg/dr. brynett   CESAREAN SECTION  2008   RPH   TONSILLECTOMY  2006   There are no active problems to display for this patient.   ONSET DATE: diagnosed 12 years ago.   REFERRING DIAG: MS, gait and mobility   THERAPY DIAG:  Difficulty in walking, not elsewhere classified  Abnormality of gait and mobility  Unsteadiness on feet  Muscle weakness (generalized)  Rationale for Evaluation and Treatment:  Rehabilitation  SUBJECTIVE:                                                                                                                                                                                             SUBJECTIVE STATEMENT:  Pt reports that she took a long walk the other day  and had to hold onto her husband to get home. Pt states that she has felt sore ever since that walk.     Pt accompanied by: self  PERTINENT HISTORY: Patient was seen by physical therapy until September of last year. Pmh includes MS, UTI, anxiety. Has extreme spasticity in LLE. Extreme cold and heat affect her LLE. Patient is now taking hormonal medication. Patient is a Chartered certified accountant.   PAIN:  Are you having pain? No  PRECAUTIONS: None  RED FLAGS: None   WEIGHT BEARING RESTRICTIONS: No  FALLS: Has patient fallen in last 6 months? No  LIVING ENVIRONMENT: Lives with: lives with their family Lives in: House/apartment Stairs: no stairs to enter, lives on second floor Has following equipment at home: Single point cane and shower chair  PLOF: Independent  PATIENT GOALS: continue walking as long as she can.   OBJECTIVE:  Note: Objective measures were completed at Evaluation unless otherwise noted.  DIAGNOSTIC FINDINGS: n/a  COGNITION: Overall cognitive status: Within functional limits for tasks assessed   SENSATION: WFL; slight decrease to RLE   COORDINATION: Heel slide: limited LLE    MUSCLE TONE: LLE: Mild and Moderate   POSTURE: No Significant postural limitations  LOWER EXTREMITY ROM:     WFL  LOWER EXTREMITY MMT:    MMT Right Eval Left Eval  Hip flexion 6.4 4.2  Hip extension    Hip abduction 7.9 3.4  Hip adduction 7.5 8.5  Hip internal rotation    Hip external rotation    Knee flexion 11.1 10.2  Knee extension 7.8 10  Ankle dorsiflexion 10.8 6.8  Ankle plantarflexion 7.2 6.9  Ankle inversion    Ankle eversion    (Blank rows = not tested)  BED MOBILITY:   Scooting into bed is very challenging at end of night  TRANSFERS: Assistive device utilized: Single point cane  Sit to stand: Complete Independence Stand to sit: Complete Independence Chair to chair: Complete Independence    STAIRS: Level of Assistance: CGA Stair Negotiation Technique: Alternating Pattern  with Single Rail on Right Number of Stairs: 5  Height of Stairs: 6 inch    GAIT: Gait pattern: step through pattern and poor foot clearance- Left Distance walked: 60 ft Assistive device utilized: None Level of assistance: CGA Comments: L foot scuffing  FUNCTIONAL TESTS:  5 times sit to stand: 13 seconds with arms crossed.  6 minute walk test: next session  Functional gait assessment: 14/30  PATIENT SURVEYS:  ABC scale 60.5%                                                                                                                               TREATMENT DATE: 10/03/23  Gait belt donned with CGA for all standing activities, thorough therapeutic rest breaks provided throughout session  TherAct Dynadisc L ankle PF/DF x10 each direction Dynadisc L ankle inversion/eversion x10 each direction Dynadisc L ankle circles x10 each direction  Seated:  LAQ with 2# AW x10 each LE Marches with 2# AW x15 each LE GTB HS curls x15 each LE HS stretch 2x30 seconds each LE Adduction with green ball x15 with 3 second isometric hold Adduction with lateral toe taps x10 each LE     Neuro Re-ed: Weighted ambulation (2# AW) in hallway forward and retrograde 55ft x 2 each-    Standing on Airex pad, twist to place balls (25) into basketball goal from side to side; 1 round from L to R & 1 round from R to L    PATIENT EDUCATION: Education details: Exercise technique; importance of daily stretching Person educated: Patient Education method: Explanation, Demonstration, Tactile cues, and Verbal cues Education comprehension: verbalized understanding, returned demonstration, verbal  cues required, and tactile cues required   HOME EXERCISE PROGRAM: Access Code: [email or web address] URL: [email or web address] Date: 08/13/2023 Prepared by: Reyes London  Exercises - Hooklying Single Knee to Chest Stretch  - 1-2 x daily - 3 sets - 30 sec  hold - Supine Lower Trunk Rotation  - 1-2 x daily - 3 sets - 30 sec hold - Supine Hamstring Stretch with Strap  - 1-2 x daily - 3 sets - 30 sec hold - Seated Hamstring Stretch  - 1-2 x daily - 3 sets - 30 sec hold - Supine Hip Internal and External Rotation  - 1-2 x daily - 3 sets - 10 reps - Seated Hip External Rotation Stretch  - 1 x daily - 3 sets - 30 sec hold - Supine Figure 4 Piriformis Stretch  - 1 x daily - 3 sets - 30 sec hold       Access Code: [email or web address] URL: [email or web address] Date: 05/29/2023 Prepared by: Marina  Moser  Exercises - Towel Scrunches  - 1 x daily - 7 x weekly - 2 sets - 10 reps - 5 hold - Toe Spreading  - 1 x daily - 7 x weekly - 2 sets - 10 reps - 5 hold - Seated Ankle Alphabet  - 1 x daily - 7 x weekly - 2 sets - 1 reps - 5 hold  GOALS: Goals reviewed with patient? Yes  SHORT TERM GOALS: Target date: 05/22/2023  Patient will be independent in home exercise program to improve strength/mobility for better functional independence with ADLs. Baseline: 4/14: compliant  Goal status: MET     LONG TERM GOALS: Target date: 10/08/2023    Patient will increase Functional Gait Assessment score to >25/30 as to reduce fall risk and improve dynamic gait safety with community ambulation. Baseline: 1/28: 14/30  4/14: 17/30  6/11: 18/30 Goal status: Partially Met  2.   Patient (< 6 years old) will complete five times sit to stand test in < 10 seconds indicating an increased LE strength and improved balance. Baseline: 1/28: 13 seconds 4/14: 10.72 seconds no hands 6/11: 10 seconds no hands  Goal status: MET  3.  Patient will increase six minute walk test distance to >1000 for  progression to community ambulator and improve gait ability Baseline: Perform next session 3/4: 820 ft 4/14: 890 ft with [facility]  6/11: 888 ft with [facility] Goal status: Partially Met   4.  Patient will return to nightly ambulation's with less than five foot scuffs per ambulation to return to PLOF.  Baseline: 1/28: limited ambulation 4/14: some limitations pending on day. Encouraged to perform multiple short walks 6/11: started doing nightly walks ; decreased foot scuffs  Goal status: Partially Met   5. Patient will increase ABC scale  score >80% to demonstrate better functional mobility and better confidence with ADLs.  Baseline: 1/28: 60%  4/14: 65%  6/11: 68%  Goal status: Partially Met   ASSESSMENT:  CLINICAL IMPRESSION:    Pt presented with increased fatigue today, but tolerated dynamic ambulation/balance well with plan adjusted to include more seated ther-ex today. Pt continues to display improved L foot clearance with ambulation despite fatigue today. Pt remained motivated to participate in PT and was receptive to encouragement provided during session. Pt will continue to benefit from skilled therapy to address remaining deficits in order to improve overall QoL and return to PLOF.     OBJECTIVE IMPAIRMENTS: Abnormal gait, decreased activity tolerance, decreased balance, decreased coordination, decreased endurance, decreased mobility, difficulty walking, decreased strength, impaired perceived functional ability, impaired flexibility, impaired UE functional use, and improper body mechanics.   ACTIVITY LIMITATIONS: carrying, lifting, bending, sitting, standing, squatting, stairs, transfers, bed mobility, dressing, reach over head, hygiene/grooming, locomotion level, and caring for others  PARTICIPATION LIMITATIONS: meal prep, cleaning, laundry, interpersonal relationship, driving, shopping, community activity, and occupation  PERSONAL FACTORS: Age, Fitness, Past/current experiences, Profession,  Time since onset of injury/illness/exacerbation, and 1-2 comorbidities: MS anxiety are also affecting patient's functional outcome.   REHAB POTENTIAL: Good  CLINICAL DECISION MAKING: Evolving/moderate complexity  EVALUATION COMPLEXITY: Moderate  PLAN:  PT FREQUENCY: 2x/week  PT DURATION: 12 weeks  PLANNED INTERVENTIONS: 97164- PT Re-evaluation, 97110-Therapeutic exercises, 97530- Therapeutic activity, 97112- Neuromuscular re-education, 97535- Self Care, [id number]- Manual therapy, [phone]- Gait training, [phone]- Orthotic Fit/training, [phone]- Canalith repositioning, Patient/Family education, Balance training, Stair training, Taping, Dry Needling, Joint mobilization, Joint manipulation, Spinal mobilization, Vestibular training, Visual/preceptual remediation/compensation, DME instructions, Cryotherapy, and Moist heat  PLAN FOR NEXT SESSION:  airex pad/balance, LLE strengthening, HEP , LLE coordination. Adding cognitive/manual dual task with dynamic balance activities      Janell Axe, SPT  This entire session was performed under direct supervision and direction of a licensed therapist/therapist assistant . I have personally read, edited and approve of the note as written.  Marina  Leopoldo, PT, DPT Physical Therapist - [facility]  Outpatient Physical Therapy- [facility] [phone]     10/03/23, 3:11 PM

## 2023-10-08 ENCOUNTER — Ambulatory Visit: Payer: No Typology Code available for payment source

## 2023-10-08 DIAGNOSIS — R2681 Unsteadiness on feet: Secondary | ICD-10-CM

## 2023-10-08 DIAGNOSIS — R262 Difficulty in walking, not elsewhere classified: Secondary | ICD-10-CM | POA: Diagnosis not present

## 2023-10-08 DIAGNOSIS — R269 Unspecified abnormalities of gait and mobility: Secondary | ICD-10-CM

## 2023-10-08 DIAGNOSIS — M6281 Muscle weakness (generalized): Secondary | ICD-10-CM

## 2023-10-08 NOTE — Therapy (Signed)
 OUTPATIENT PHYSICAL THERAPY NEURO TREATMENT/ RE-CERT       Patient Name: Michelle Ruiz MRN: [id number] DOB:08-Aug-1970, 53 y.o., female Today's Date: 10/08/2023   PCP: Sherial Bail  REFERRING PROVIDER: Maree Hila K  END OF SESSION:  PT End of Session - 10/08/23 1313     Visit Number 27    Number of Visits 51    Date for PT Re-Evaluation 12/31/23    Progress Note Due on Visit 70    PT Start Time 1315    PT Stop Time 1357    PT Time Calculation (min) 42 min    Equipment Utilized During Treatment Gait belt    Activity Tolerance Patient tolerated treatment well    Behavior During Therapy WFL for tasks assessed/performed                               Past Medical History:  Diagnosis Date   Abnormal Pap smear of cervix    Actinic keratosis 09/17/2023   R spinal mid upper back   Anxiety    Basal cell carcinoma 04/14/2008   Right upper ant. thigh. BCC with sclerosis.   Basal cell carcinoma 08/14/2008   Right lat. lower thigh. Superficial.    Basal cell carcinoma 04/29/2019   Right mid forearm. Superficial and nodular patterns. EDC.   Basal cell carcinoma 08/17/2020   Right upper antecubital. EDC.   Basal cell carcinoma 08/23/2021   Left Lower Leg, EDC   Basal cell carcinoma 09/17/2023   L medial lower leg, EDC   Kidney stones    MS (multiple sclerosis) (HCC)    UTI (urinary tract infection)    Past Surgical History:  Procedure Laterality Date   BREAST BIOPSY Right 2010   stereotactic biopsy/ neg/ dr.byrnett   BREAST BIOPSY Left 2012   neg/dr. brynett   CESAREAN SECTION  2008   RPH   TONSILLECTOMY  2006   There are no active problems to display for this patient.   ONSET DATE: diagnosed 12 years ago.   REFERRING DIAG: MS, gait and mobility   THERAPY DIAG:  Difficulty in walking, not elsewhere classified  Abnormality of gait and mobility  Unsteadiness on feet  Muscle weakness (generalized)  Rationale for Evaluation and  Treatment: Rehabilitation  SUBJECTIVE:                                                                                                                                                                                             SUBJECTIVE STATEMENT:  Pt reports that she is feeling good today. Pt  reports that after last session it was easier to walk out to her car than it was to walk in.    Pt accompanied by: self  PERTINENT HISTORY: Patient was seen by physical therapy until September of last year. Pmh includes MS, UTI, anxiety. Has extreme spasticity in LLE. Extreme cold and heat affect her LLE. Patient is now taking hormonal medication. Patient is a Chartered certified accountant.   PAIN:  Are you having pain? No  PRECAUTIONS: None  RED FLAGS: None   WEIGHT BEARING RESTRICTIONS: No  FALLS: Has patient fallen in last 6 months? No  LIVING ENVIRONMENT: Lives with: lives with their family Lives in: House/apartment Stairs: no stairs to enter, lives on second floor Has following equipment at home: Single point cane and shower chair  PLOF: Independent  PATIENT GOALS: continue walking as long as she can.   OBJECTIVE:  Note: Objective measures were completed at Evaluation unless otherwise noted.  DIAGNOSTIC FINDINGS: n/a  COGNITION: Overall cognitive status: Within functional limits for tasks assessed   SENSATION: WFL; slight decrease to RLE   COORDINATION: Heel slide: limited LLE    MUSCLE TONE: LLE: Mild and Moderate   POSTURE: No Significant postural limitations  LOWER EXTREMITY ROM:     WFL  LOWER EXTREMITY MMT:    MMT Right Eval Left Eval  Hip flexion 6.4 4.2  Hip extension    Hip abduction 7.9 3.4  Hip adduction 7.5 8.5  Hip internal rotation    Hip external rotation    Knee flexion 11.1 10.2  Knee extension 7.8 10  Ankle dorsiflexion 10.8 6.8  Ankle plantarflexion 7.2 6.9  Ankle inversion    Ankle eversion    (Blank rows = not tested)  BED MOBILITY:   Scooting into bed is very challenging at end of night  TRANSFERS: Assistive device utilized: Single point cane  Sit to stand: Complete Independence Stand to sit: Complete Independence Chair to chair: Complete Independence    STAIRS: Level of Assistance: CGA Stair Negotiation Technique: Alternating Pattern  with Single Rail on Right Number of Stairs: 5  Height of Stairs: 6 inch    GAIT: Gait pattern: step through pattern and poor foot clearance- Left Distance walked: 60 ft Assistive device utilized: None Level of assistance: CGA Comments: L foot scuffing  FUNCTIONAL TESTS:  5 times sit to stand: 13 seconds with arms crossed.  6 minute walk test: next session  Functional gait assessment: 14/30  PATIENT SURVEYS:  ABC scale 60.5%                                                                                                                               TREATMENT DATE: 10/08/23   [facility] PT Assessment - 10/08/23 0001       Functional Gait  Assessment   Gait assessed  Yes    Gait Level Surface Walks 20 ft in less than 7 sec but greater than 5.5 sec, uses assistive  device, slower speed, mild gait deviations, or deviates 6-10 in outside of the 12 in walkway width.    Change in Gait Speed Able to change speed, demonstrates mild gait deviations, deviates 6-10 in outside of the 12 in walkway width, or no gait deviations, unable to achieve a major change in velocity, or uses a change in velocity, or uses an assistive device.    Gait with Horizontal Head Turns Performs head turns smoothly with slight change in gait velocity (eg, minor disruption to smooth gait path), deviates 6-10 in outside 12 in walkway width, or uses an assistive device.    Gait with Vertical Head Turns Performs task with slight change in gait velocity (eg, minor disruption to smooth gait path), deviates 6 - 10 in outside 12 in walkway width or uses assistive device    Gait and Pivot Turn Pivot turns safely in  greater than 3 sec and stops with no loss of balance, or pivot turns safely within 3 sec and stops with mild imbalance, requires small steps to catch balance.    Step Over Obstacle Is able to step over one shoe box (4.5 in total height) but must slow down and adjust steps to clear box safely. May require verbal cueing.    Gait with Narrow Base of Support Ambulates 7-9 steps.    Gait with Eyes Closed Walks 20 ft, uses assistive device, slower speed, mild gait deviations, deviates 6-10 in outside 12 in walkway width. Ambulates 20 ft in less than 9 sec but greater than 7 sec.    Ambulating Backwards Walks 20 ft, no assistive devices, good speed, no evidence for imbalance, normal gait    Steps Alternating feet, must use rail.    Total Score 20        Gait belt donned with CGA for all standing activities, thorough therapeutic rest breaks provided throughout session  Physical Performance Measures: 6 Min Walk Test:  Instructed patient to ambulate as quickly and as safely as possible for 6 minutes using LRAD. Patient was allowed to take standing rest breaks without stopping the test, but if the patient required a sitting rest break the clock would be stopped and the test would be over.  Results: 870 feet (265.176 meters) using a SPC with CGA. Results indicate that the patient has reduced endurance with ambulation compared to age matched norms.  Age Matched Norms: 58-69 yo M: 57 F: 38, 56-79 yo M: 76 F: 471, 53-89 yo M: 417 F: 392 MDC: 58.21 meters (190.98 feet) or 50 meters (ANPTA Core Set of Outcome Measures for Adults with Neurologic Conditions, 2018)   Functional Gait Assessment:   [facility] PT Assessment - 10/08/23 0001       Functional Gait  Assessment   Gait assessed  Yes    Gait Level Surface Walks 20 ft in less than 7 sec but greater than 5.5 sec, uses assistive device, slower speed, mild gait deviations, or deviates 6-10 in outside of the 12 in walkway width.    Change in Gait Speed Able to  change speed, demonstrates mild gait deviations, deviates 6-10 in outside of the 12 in walkway width, or no gait deviations, unable to achieve a major change in velocity, or uses a change in velocity, or uses an assistive device.    Gait with Horizontal Head Turns Performs head turns smoothly with slight change in gait velocity (eg, minor disruption to smooth gait path), deviates 6-10 in outside 12 in walkway width, or uses an assistive  device.    Gait with Vertical Head Turns Performs task with slight change in gait velocity (eg, minor disruption to smooth gait path), deviates 6 - 10 in outside 12 in walkway width or uses assistive device    Gait and Pivot Turn Pivot turns safely in greater than 3 sec and stops with no loss of balance, or pivot turns safely within 3 sec and stops with mild imbalance, requires small steps to catch balance.    Step Over Obstacle Is able to step over one shoe box (4.5 in total height) but must slow down and adjust steps to clear box safely. May require verbal cueing.    Gait with Narrow Base of Support Ambulates 7-9 steps.    Gait with Eyes Closed Walks 20 ft, uses assistive device, slower speed, mild gait deviations, deviates 6-10 in outside 12 in walkway width. Ambulates 20 ft in less than 9 sec but greater than 7 sec.    Ambulating Backwards Walks 20 ft, no assistive devices, good speed, no evidence for imbalance, normal gait    Steps Alternating feet, must use rail.    Total Score 20         ABC Scale: 59.38%  TherAct Dynadisc L ankle PF/DF x15 each direction Dynadisc L ankle inversion/eversion x15 each direction Dynadisc L ankle circles x15 each direction  Seated:  LAQ with 2# AW 2x10 each LE Adduction with rainbow ball x15 with 3 second isometric hold        PATIENT EDUCATION: Education details: Exercise technique; importance of daily stretching Person educated: Patient Education method: Explanation, Demonstration, Tactile cues, and Verbal  cues Education comprehension: verbalized understanding, returned demonstration, verbal cues required, and tactile cues required   HOME EXERCISE PROGRAM: Access Code: [email or web address] URL: [email or web address] Date: 08/13/2023 Prepared by: Reyes London  Exercises - Hooklying Single Knee to Chest Stretch  - 1-2 x daily - 3 sets - 30 sec  hold - Supine Lower Trunk Rotation  - 1-2 x daily - 3 sets - 30 sec hold - Supine Hamstring Stretch with Strap  - 1-2 x daily - 3 sets - 30 sec hold - Seated Hamstring Stretch  - 1-2 x daily - 3 sets - 30 sec hold - Supine Hip Internal and External Rotation  - 1-2 x daily - 3 sets - 10 reps - Seated Hip External Rotation Stretch  - 1 x daily - 3 sets - 30 sec hold - Supine Figure 4 Piriformis Stretch  - 1 x daily - 3 sets - 30 sec hold       Access Code: [email or web address] URL: [email or web address] Date: 05/29/2023 Prepared by: Marina  Moser  Exercises - Towel Scrunches  - 1 x daily - 7 x weekly - 2 sets - 10 reps - 5 hold - Toe Spreading  - 1 x daily - 7 x weekly - 2 sets - 10 reps - 5 hold - Seated Ankle Alphabet  - 1 x daily - 7 x weekly - 2 sets - 1 reps - 5 hold  GOALS: Goals reviewed with patient? Yes  SHORT TERM GOALS: Target date: 05/22/2023  Patient will be independent in home exercise program to improve strength/mobility for better functional independence with ADLs. Baseline: 4/14: compliant  Goal status: MET     LONG TERM GOALS: Target date: 12/31/2023    Patient will increase Functional Gait Assessment score to >25/30 as to reduce fall risk and improve dynamic gait safety with community ambulation. Baseline: 1/28: 14/30  4/14: 17/30  6/11: 18/30 7/14: 20/30 Goal status: Partially Met  2.   Patient (< 33 years old) will complete five times sit to stand test in < 10 seconds indicating an increased LE strength and improved balance. Baseline: 1/28: 13 seconds 4/14: 10.72 seconds no hands 6/11: 10 seconds no  hands  Goal status: MET  3.  Patient will increase six minute walk test distance to >1000 for progression to community ambulator and improve gait ability Baseline: Perform next session 3/4: 820 ft 4/14: 890 ft with [facility]  6/11: 888 ft with [facility] 7/14: 870 ft with SPC- only one instance of L foot catching on ground throughout test Goal status: Partially Met   4.  Patient will return to nightly ambulation's with less than five foot scuffs per ambulation to return to PLOF.  Baseline: 1/28: limited ambulation 4/14: some limitations pending on day. Encouraged to perform multiple short walks 6/11: started doing nightly walks ; decreased foot scuffs  7/14: pt reports no scuffs Goal status: MET  5. Patient will increase ABC scale score >80% to demonstrate better functional mobility and better confidence with ADLs.  Baseline: 1/28: 60%  4/14: 65%  6/11: 68% 7/14: 59.38% Goal status: Partially Met   ASSESSMENT:  CLINICAL IMPRESSION:    Pt tolerated assessment of goals today well with thorough rest breaks provided to prevent excess fatigue. Pt was able to meet her goal of nightly walks without instances of her L foot scuffing the ground. Pt continues to make progress toward her goals on the , FGA, and ABC scale. Pt continues to show improvements in L foot clearance with only one instance of L foot catching during . Pt will continue to benefit from skilled therapy to address remaining deficits in order to improve overall QoL and return to PLOF.     OBJECTIVE IMPAIRMENTS: Abnormal gait, decreased activity tolerance, decreased balance, decreased coordination, decreased endurance, decreased mobility, difficulty walking, decreased strength, impaired perceived functional ability, impaired flexibility, impaired UE functional use, and improper body mechanics.   ACTIVITY LIMITATIONS: carrying, lifting, bending, sitting, standing, squatting, stairs, transfers, bed mobility, dressing, reach over head,  hygiene/grooming, locomotion level, and caring for others  PARTICIPATION LIMITATIONS: meal prep, cleaning, laundry, interpersonal relationship, driving, shopping, community activity, and occupation  PERSONAL FACTORS: Age, Fitness, Past/current experiences, Profession, Time since onset of injury/illness/exacerbation, and 1-2 comorbidities: MS anxiety are also affecting patient's functional outcome.   REHAB POTENTIAL: Good  CLINICAL DECISION MAKING: Evolving/moderate complexity  EVALUATION COMPLEXITY: Moderate  PLAN:  PT FREQUENCY: 2x/week  PT DURATION: 12 weeks  PLANNED INTERVENTIONS: 97164- PT Re-evaluation, 97110-Therapeutic exercises, 97530- Therapeutic activity, 97112- Neuromuscular re-education, 97535- Self Care, [id number]- Manual therapy, [phone]- Gait training, [phone]- Orthotic Fit/training, [phone]- Canalith repositioning, Patient/Family education, Balance training, Stair training, Taping, Dry Needling, Joint mobilization, Joint manipulation, Spinal mobilization, Vestibular training, Visual/preceptual remediation/compensation, DME instructions, Cryotherapy, and Moist heat  PLAN FOR NEXT SESSION:  airex pad/balance, LLE strengthening, HEP , LLE coordination. Adding cognitive/manual dual task with dynamic balance activities     Janell Axe, SPT  This entire session was performed under direct supervision and direction of a licensed therapist/therapist assistant . I have personally read, edited and approve of the note as written.  Marina  Leopoldo KLEIN, DPT Physical Therapist - [facility]  Outpatient Physical Therapy- [facility] [phone]     10/08/23, 2:25 PM

## 2023-10-10 ENCOUNTER — Ambulatory Visit: Payer: No Typology Code available for payment source

## 2023-10-10 DIAGNOSIS — M6281 Muscle weakness (generalized): Secondary | ICD-10-CM

## 2023-10-10 DIAGNOSIS — R2681 Unsteadiness on feet: Secondary | ICD-10-CM

## 2023-10-10 DIAGNOSIS — R269 Unspecified abnormalities of gait and mobility: Secondary | ICD-10-CM

## 2023-10-10 DIAGNOSIS — R262 Difficulty in walking, not elsewhere classified: Secondary | ICD-10-CM

## 2023-10-10 NOTE — Therapy (Signed)
 OUTPATIENT PHYSICAL THERAPY NEURO TREATMENT       Patient Name: Michelle Ruiz MRN: [id number] DOB:1970/12/13, 53 y.o., female Today's Date: 10/10/2023   PCP: Sherial Bail  REFERRING PROVIDER: Maree Hila K  END OF SESSION:  PT End of Session - 10/10/23 1355     Visit Number 28    Number of Visits 51    Date for PT Re-Evaluation 12/31/23    Progress Note Due on Visit 70    PT Start Time 1359    PT Stop Time 1441    PT Time Calculation (min) 42 min    Equipment Utilized During Treatment Gait belt    Activity Tolerance Patient tolerated treatment well    Behavior During Therapy WFL for tasks assessed/performed                                Past Medical History:  Diagnosis Date   Abnormal Pap smear of cervix    Actinic keratosis 09/17/2023   R spinal mid upper back   Anxiety    Basal cell carcinoma 04/14/2008   Right upper ant. thigh. BCC with sclerosis.   Basal cell carcinoma 08/14/2008   Right lat. lower thigh. Superficial.    Basal cell carcinoma 04/29/2019   Right mid forearm. Superficial and nodular patterns. EDC.   Basal cell carcinoma 08/17/2020   Right upper antecubital. EDC.   Basal cell carcinoma 08/23/2021   Left Lower Leg, EDC   Basal cell carcinoma 09/17/2023   L medial lower leg, EDC   Kidney stones    MS (multiple sclerosis) (HCC)    UTI (urinary tract infection)    Past Surgical History:  Procedure Laterality Date   BREAST BIOPSY Right 2010   stereotactic biopsy/ neg/ dr.byrnett   BREAST BIOPSY Left 2012   neg/dr. brynett   CESAREAN SECTION  2008   RPH   TONSILLECTOMY  2006   There are no active problems to display for this patient.   ONSET DATE: diagnosed 12 years ago.   REFERRING DIAG: MS, gait and mobility   THERAPY DIAG:  Difficulty in walking, not elsewhere classified  Abnormality of gait and mobility  Unsteadiness on feet  Muscle weakness (generalized)  Rationale for Evaluation and  Treatment: Rehabilitation  SUBJECTIVE:                                                                                                                                                                                             SUBJECTIVE STATEMENT:  Pt reports that she feels like her walking is  not good today.    Pt accompanied by: self  PERTINENT HISTORY: Patient was seen by physical therapy until September of last year. Pmh includes MS, UTI, anxiety. Has extreme spasticity in LLE. Extreme cold and heat affect her LLE. Patient is now taking hormonal medication. Patient is a Chartered certified accountant.   PAIN:  Are you having pain? No  PRECAUTIONS: None  RED FLAGS: None   WEIGHT BEARING RESTRICTIONS: No  FALLS: Has patient fallen in last 6 months? No  LIVING ENVIRONMENT: Lives with: lives with their family Lives in: House/apartment Stairs: no stairs to enter, lives on second floor Has following equipment at home: Single point cane and shower chair  PLOF: Independent  PATIENT GOALS: continue walking as long as she can.   OBJECTIVE:  Note: Objective measures were completed at Evaluation unless otherwise noted.  DIAGNOSTIC FINDINGS: n/a  COGNITION: Overall cognitive status: Within functional limits for tasks assessed   SENSATION: WFL; slight decrease to RLE   COORDINATION: Heel slide: limited LLE    MUSCLE TONE: LLE: Mild and Moderate   POSTURE: No Significant postural limitations  LOWER EXTREMITY ROM:     WFL  LOWER EXTREMITY MMT:    MMT Right Eval Left Eval  Hip flexion 6.4 4.2  Hip extension    Hip abduction 7.9 3.4  Hip adduction 7.5 8.5  Hip internal rotation    Hip external rotation    Knee flexion 11.1 10.2  Knee extension 7.8 10  Ankle dorsiflexion 10.8 6.8  Ankle plantarflexion 7.2 6.9  Ankle inversion    Ankle eversion    (Blank rows = not tested)  BED MOBILITY:  Scooting into bed is very challenging at end of night  TRANSFERS: Assistive  device utilized: Single point cane  Sit to stand: Complete Independence Stand to sit: Complete Independence Chair to chair: Complete Independence    STAIRS: Level of Assistance: CGA Stair Negotiation Technique: Alternating Pattern  with Single Rail on Right Number of Stairs: 5  Height of Stairs: 6 inch    GAIT: Gait pattern: step through pattern and poor foot clearance- Left Distance walked: 60 ft Assistive device utilized: None Level of assistance: CGA Comments: L foot scuffing  FUNCTIONAL TESTS:  5 times sit to stand: 13 seconds with arms crossed.  6 minute walk test: next session  Functional gait assessment: 14/30  PATIENT SURVEYS:  ABC scale 60.5%                                                                                                                               TREATMENT DATE: 10/10/23   Gait belt donned with CGA for all standing activities, thorough therapeutic rest breaks provided throughout session   TherAct Weighted ambulation with 2# AW donned forward and backward- 34ft x4 reps forward, x2 backward  Step over orange hurdle x10 forward at support bar Lateral step over orange hurdle x10 both directions at support bar   Seated:  LAQ  with 2# AW x10 each LE Marches with 2# AW x10 Adduction with rainbow ball x15 with heel raise  Dynadisc B ankle PF/DF x15 each direction Dynadisc B ankle inversion/eversion x15 each direction Dynadisc B ankle circles x15 each direction      PATIENT EDUCATION: Education details: Exercise technique; importance of daily stretching Person educated: Patient Education method: Explanation, Demonstration, Tactile cues, and Verbal cues Education comprehension: verbalized understanding, returned demonstration, verbal cues required, and tactile cues required   HOME EXERCISE PROGRAM: Access Code: [email or web address] URL: [email or web address] Date: 08/13/2023 Prepared by: Reyes London  Exercises - Hooklying  Single Knee to Chest Stretch  - 1-2 x daily - 3 sets - 30 sec  hold - Supine Lower Trunk Rotation  - 1-2 x daily - 3 sets - 30 sec hold - Supine Hamstring Stretch with Strap  - 1-2 x daily - 3 sets - 30 sec hold - Seated Hamstring Stretch  - 1-2 x daily - 3 sets - 30 sec hold - Supine Hip Internal and External Rotation  - 1-2 x daily - 3 sets - 10 reps - Seated Hip External Rotation Stretch  - 1 x daily - 3 sets - 30 sec hold - Supine Figure 4 Piriformis Stretch  - 1 x daily - 3 sets - 30 sec hold       Access Code: [email or web address] URL: [email or web address] Date: 05/29/2023 Prepared by: Marina  Moser  Exercises - Towel Scrunches  - 1 x daily - 7 x weekly - 2 sets - 10 reps - 5 hold - Toe Spreading  - 1 x daily - 7 x weekly - 2 sets - 10 reps - 5 hold - Seated Ankle Alphabet  - 1 x daily - 7 x weekly - 2 sets - 1 reps - 5 hold  GOALS: Goals reviewed with patient? Yes  SHORT TERM GOALS: Target date: 05/22/2023  Patient will be independent in home exercise program to improve strength/mobility for better functional independence with ADLs. Baseline: 4/14: compliant  Goal status: MET     LONG TERM GOALS: Target date: 12/31/2023    Patient will increase Functional Gait Assessment score to >25/30 as to reduce fall risk and improve dynamic gait safety with community ambulation. Baseline: 1/28: 14/30  4/14: 17/30  6/11: 18/30 7/14: 20/30 Goal status: Partially Met  2.   Patient (< 109 years old) will complete five times sit to stand test in < 10 seconds indicating an increased LE strength and improved balance. Baseline: 1/28: 13 seconds 4/14: 10.72 seconds no hands 6/11: 10 seconds no hands  Goal status: MET  3.  Patient will increase six minute walk test distance to >1000 for progression to community ambulator and improve gait ability Baseline: Perform next session 3/4: 820 ft 4/14: 890 ft with [facility]  6/11: 888 ft with [facility] 7/14: 870 ft with SPC- only one instance of L foot  catching on ground throughout test Goal status: Partially Met   4.  Patient will return to nightly ambulation's with less than five foot scuffs per ambulation to return to PLOF.  Baseline: 1/28: limited ambulation 4/14: some limitations pending on day. Encouraged to perform multiple short walks 6/11: started doing nightly walks ; decreased foot scuffs  7/14: pt reports no scuffs Goal status: MET  5. Patient will increase ABC scale score >80% to demonstrate better functional mobility and better confidence with ADLs.  Baseline: 1/28: 60%  4/14: 65%  6/11: 68% 7/14: 59.38% Goal status: Partially Met   ASSESSMENT:  CLINICAL IMPRESSION:    Pt reported increased fatigue today, however was able to perform all dynamic ambulation and strengthening tasks well. Pt maintains good foot clearance with clearing obstacles and only had one instance of foot scuffing during initial bout of ambulation. Pt had noticeable fatigue/clonus in L LE today at end of session, but pt denied excessive soreness and remained engaged and motivated throughout. Pt will continue to benefit from skilled therapy to address remaining deficits in order to improve overall QoL and return to PLOF.     OBJECTIVE IMPAIRMENTS: Abnormal gait, decreased activity tolerance, decreased balance, decreased coordination, decreased endurance, decreased mobility, difficulty walking, decreased strength, impaired perceived functional ability, impaired flexibility, impaired UE functional use, and improper body mechanics.   ACTIVITY LIMITATIONS: carrying, lifting, bending, sitting, standing, squatting, stairs, transfers, bed mobility, dressing, reach over head, hygiene/grooming, locomotion level, and caring for others  PARTICIPATION LIMITATIONS: meal prep, cleaning, laundry, interpersonal relationship, driving, shopping, community activity, and occupation  PERSONAL FACTORS: Age, Fitness, Past/current experiences, Profession, Time since onset of  injury/illness/exacerbation, and 1-2 comorbidities: MS anxiety are also affecting patient's functional outcome.   REHAB POTENTIAL: Good  CLINICAL DECISION MAKING: Evolving/moderate complexity  EVALUATION COMPLEXITY: Moderate  PLAN:  PT FREQUENCY: 2x/week  PT DURATION: 12 weeks  PLANNED INTERVENTIONS: 97164- PT Re-evaluation, 97110-Therapeutic exercises, 97530- Therapeutic activity, 97112- Neuromuscular re-education, 97535- Self Care, 02859- Manual therapy, [phone]- Gait training, [phone]- Orthotic Fit/training, [phone]- Canalith repositioning, Patient/Family education, Balance training, Stair training, Taping, Dry Needling, Joint mobilization, Joint manipulation, Spinal mobilization, Vestibular training, Visual/preceptual remediation/compensation, DME instructions, Cryotherapy, and Moist heat  PLAN FOR NEXT SESSION:  airex pad/balance, LLE strengthening, HEP , LLE coordination. Adding cognitive/manual dual task with dynamic balance activities     Janell Axe, SPT  This entire session was performed under direct supervision and direction of a licensed therapist/therapist assistant . I have personally read, edited and approve of the note as written.  Marina  Leopoldo, PT, DPT Physical Therapist - [facility]  Outpatient Physical Therapy- Main Campus [phone]     10/10/23, 4:26 PM

## 2023-10-15 ENCOUNTER — Ambulatory Visit: Payer: No Typology Code available for payment source

## 2023-10-15 NOTE — Therapy (Incomplete)
 OUTPATIENT PHYSICAL THERAPY NEURO TREATMENT       Patient Name: Michelle Ruiz MRN: [id number] DOB:06-13-1970, 53 y.o., female Today's Date: 10/15/2023   PCP: Sherial Bail  REFERRING PROVIDER: Maree Hila K  END OF SESSION:                          Past Medical History:  Diagnosis Date   Abnormal Pap smear of cervix    Actinic keratosis 09/17/2023   R spinal mid upper back   Anxiety    Basal cell carcinoma 04/14/2008   Right upper ant. thigh. BCC with sclerosis.   Basal cell carcinoma 08/14/2008   Right lat. lower thigh. Superficial.    Basal cell carcinoma 04/29/2019   Right mid forearm. Superficial and nodular patterns. EDC.   Basal cell carcinoma 08/17/2020   Right upper antecubital. EDC.   Basal cell carcinoma 08/23/2021   Left Lower Leg, EDC   Basal cell carcinoma 09/17/2023   L medial lower leg, EDC   Kidney stones    MS (multiple sclerosis) (HCC)    UTI (urinary tract infection)    Past Surgical History:  Procedure Laterality Date   BREAST BIOPSY Right 2010   stereotactic biopsy/ neg/ dr.byrnett   BREAST BIOPSY Left 2012   neg/dr. brynett   CESAREAN SECTION  2008   RPH   TONSILLECTOMY  2006   There are no active problems to display for this patient.   ONSET DATE: diagnosed 12 years ago.   REFERRING DIAG: MS, gait and mobility   THERAPY DIAG:  No diagnosis found.  Rationale for Evaluation and Treatment: Rehabilitation  SUBJECTIVE:                                                                                                                                                                                             SUBJECTIVE STATEMENT:  ***  Pt accompanied by: self  PERTINENT HISTORY: Patient was seen by physical therapy until September of last year. Pmh includes MS, UTI, anxiety. Has extreme spasticity in LLE. Extreme cold and heat affect her LLE. Patient is now taking hormonal medication. Patient is a Proofreader.   PAIN:  Are you having pain? No  PRECAUTIONS: None  RED FLAGS: None   WEIGHT BEARING RESTRICTIONS: No  FALLS: Has patient fallen in last 6 months? No  LIVING ENVIRONMENT: Lives with: lives with their family Lives in: House/apartment Stairs: no stairs to enter, lives on second floor Has following equipment at home: Single point cane and shower chair  PLOF: Independent  PATIENT GOALS: continue walking as long  as she can.   OBJECTIVE:  Note: Objective measures were completed at Evaluation unless otherwise noted.  DIAGNOSTIC FINDINGS: n/a  COGNITION: Overall cognitive status: Within functional limits for tasks assessed   SENSATION: WFL; slight decrease to RLE   COORDINATION: Heel slide: limited LLE    MUSCLE TONE: LLE: Mild and Moderate   POSTURE: No Significant postural limitations  LOWER EXTREMITY ROM:     WFL  LOWER EXTREMITY MMT:    MMT Right Eval Left Eval  Hip flexion 6.4 4.2  Hip extension    Hip abduction 7.9 3.4  Hip adduction 7.5 8.5  Hip internal rotation    Hip external rotation    Knee flexion 11.1 10.2  Knee extension 7.8 10  Ankle dorsiflexion 10.8 6.8  Ankle plantarflexion 7.2 6.9  Ankle inversion    Ankle eversion    (Blank rows = not tested)  BED MOBILITY:  Scooting into bed is very challenging at end of night  TRANSFERS: Assistive device utilized: Single point cane  Sit to stand: Complete Independence Stand to sit: Complete Independence Chair to chair: Complete Independence    STAIRS: Level of Assistance: CGA Stair Negotiation Technique: Alternating Pattern  with Single Rail on Right Number of Stairs: 5  Height of Stairs: 6 inch    GAIT: Gait pattern: step through pattern and poor foot clearance- Left Distance walked: 60 ft Assistive device utilized: None Level of assistance: CGA Comments: L foot scuffing  FUNCTIONAL TESTS:  5 times sit to stand: 13 seconds with arms crossed.  6 minute walk test:  next session  Functional gait assessment: 14/30  PATIENT SURVEYS:  ABC scale 60.5%                                                                                                                               TREATMENT DATE: 10/15/23   Gait belt donned with CGA for all standing activities, thorough therapeutic rest breaks provided throughout session   TherAct Weighted ambulation with 2# AW donned forward and backward- 51ft x4 reps forward, x2 backward  Obstacle course with 3 half-foam rollers and 2 Airex pads: pt walks to clear obstacles and step up/over Airex pad x4 rounds  Step over orange hurdle x10 forward at support bar Lateral step over orange hurdle x10 both directions at support bar   Seated:  LAQ with 2# AW x10 each LE Marches with 2# AW x10 Adduction with rainbow ball x15 with heel raise  Dynadisc B ankle PF/DF x15 each direction Dynadisc B ankle inversion/eversion x15 each direction Dynadisc B ankle circles x15 each direction      PATIENT EDUCATION: Education details: Exercise technique; importance of daily stretching Person educated: Patient Education method: Explanation, Demonstration, Tactile cues, and Verbal cues Education comprehension: verbalized understanding, returned demonstration, verbal cues required, and tactile cues required   HOME EXERCISE PROGRAM: Access Code: [email or web address] URL: [email or web address] Date: 08/13/2023 Prepared by: Reyes London  Exercises - Hooklying Single Knee  to Chest Stretch  - 1-2 x daily - 3 sets - 30 sec  hold - Supine Lower Trunk Rotation  - 1-2 x daily - 3 sets - 30 sec hold - Supine Hamstring Stretch with Strap  - 1-2 x daily - 3 sets - 30 sec hold - Seated Hamstring Stretch  - 1-2 x daily - 3 sets - 30 sec hold - Supine Hip Internal and External Rotation  - 1-2 x daily - 3 sets - 10 reps - Seated Hip External Rotation Stretch  - 1 x daily - 3 sets - 30 sec hold - Supine Figure 4 Piriformis Stretch   - 1 x daily - 3 sets - 30 sec hold       Access Code: [email or web address] URL: [email or web address] Date: 05/29/2023 Prepared by: Marina  Moser  Exercises - Towel Scrunches  - 1 x daily - 7 x weekly - 2 sets - 10 reps - 5 hold - Toe Spreading  - 1 x daily - 7 x weekly - 2 sets - 10 reps - 5 hold - Seated Ankle Alphabet  - 1 x daily - 7 x weekly - 2 sets - 1 reps - 5 hold  GOALS: Goals reviewed with patient? Yes  SHORT TERM GOALS: Target date: 05/22/2023  Patient will be independent in home exercise program to improve strength/mobility for better functional independence with ADLs. Baseline: 4/14: compliant  Goal status: MET     LONG TERM GOALS: Target date: 12/31/2023    Patient will increase Functional Gait Assessment score to >25/30 as to reduce fall risk and improve dynamic gait safety with community ambulation. Baseline: 1/28: 14/30  4/14: 17/30  6/11: 18/30 7/14: 20/30 Goal status: Partially Met  2.   Patient (< 75 years old) will complete five times sit to stand test in < 10 seconds indicating an increased LE strength and improved balance. Baseline: 1/28: 13 seconds 4/14: 10.72 seconds no hands 6/11: 10 seconds no hands  Goal status: MET  3.  Patient will increase six minute walk test distance to >1000 for progression to community ambulator and improve gait ability Baseline: Perform next session 3/4: 820 ft 4/14: 890 ft with [facility]  6/11: 888 ft with [facility] 7/14: 870 ft with [facility]- only one instance of L foot catching on ground throughout test Goal status: Partially Met   4.  Patient will return to nightly ambulation's with less than five foot scuffs per ambulation to return to PLOF.  Baseline: 1/28: limited ambulation 4/14: some limitations pending on day. Encouraged to perform multiple short walks 6/11: started doing nightly walks ; decreased foot scuffs  7/14: pt reports no scuffs Goal status: MET  5. Patient will increase ABC scale score >80% to demonstrate better  functional mobility and better confidence with ADLs.  Baseline: 1/28: 60%  4/14: 65%  6/11: 68% 7/14: 59.38% Goal status: Partially Met   ASSESSMENT:  CLINICAL IMPRESSION:    *** Pt will continue to benefit from skilled therapy to address remaining deficits in order to improve overall QoL and return to PLOF.     OBJECTIVE IMPAIRMENTS: Abnormal gait, decreased activity tolerance, decreased balance, decreased coordination, decreased endurance, decreased mobility, difficulty walking, decreased strength, impaired perceived functional ability, impaired flexibility, impaired UE functional use, and improper body mechanics.   ACTIVITY LIMITATIONS: carrying, lifting, bending, sitting, standing, squatting, stairs, transfers, bed mobility, dressing, reach over head, hygiene/grooming, locomotion level, and caring for others  PARTICIPATION LIMITATIONS: meal prep, cleaning, laundry, interpersonal relationship, driving, shopping, community  activity, and occupation  PERSONAL FACTORS: Age, Fitness, Past/current experiences, Profession, Time since onset of injury/illness/exacerbation, and 1-2 comorbidities: MS anxiety are also affecting patient's functional outcome.   REHAB POTENTIAL: Good  CLINICAL DECISION MAKING: Evolving/moderate complexity  EVALUATION COMPLEXITY: Moderate  PLAN:  PT FREQUENCY: 2x/week  PT DURATION: 12 weeks  PLANNED INTERVENTIONS: 97164- PT Re-evaluation, 97110-Therapeutic exercises, 97530- Therapeutic activity, 97112- Neuromuscular re-education, 97535- Self Care, 02859- Manual therapy, [phone]- Gait training, [phone]- Orthotic Fit/training, [phone]- Canalith repositioning, Patient/Family education, Balance training, Stair training, Taping, Dry Needling, Joint mobilization, Joint manipulation, Spinal mobilization, Vestibular training, Visual/preceptual remediation/compensation, DME instructions, Cryotherapy, and Moist heat  PLAN FOR NEXT SESSION:  airex pad/balance, LLE strengthening,  HEP , LLE coordination. Adding cognitive/manual dual task with dynamic balance activities     Janell Axe, SPT  This entire session was performed under direct supervision and direction of a licensed therapist/therapist assistant . I have personally read, edited and approve of the note as written.  Marina  Leopoldo PT, DPT Physical Therapist - [facility]  Outpatient Physical Therapy- Main Campus [phone]     10/15/23, 8:31 AM

## 2023-10-17 ENCOUNTER — Ambulatory Visit: Payer: No Typology Code available for payment source

## 2023-10-17 DIAGNOSIS — R262 Difficulty in walking, not elsewhere classified: Secondary | ICD-10-CM

## 2023-10-17 DIAGNOSIS — R2681 Unsteadiness on feet: Secondary | ICD-10-CM

## 2023-10-17 DIAGNOSIS — R269 Unspecified abnormalities of gait and mobility: Secondary | ICD-10-CM

## 2023-10-17 DIAGNOSIS — M6281 Muscle weakness (generalized): Secondary | ICD-10-CM

## 2023-10-17 NOTE — Therapy (Signed)
 OUTPATIENT PHYSICAL THERAPY NEURO TREATMENT       Patient Name: Michelle Ruiz MRN: [id number] DOB:02/01/1971, 53 y.o., female Today's Date: 10/17/2023   PCP: Sherial Bail  REFERRING PROVIDER: Maree Hila K  END OF SESSION:  PT End of Session - 10/17/23 1354     Visit Number 29    Number of Visits 51    Date for PT Re-Evaluation 12/31/23    Progress Note Due on Visit 70    PT Start Time 1358    PT Stop Time 1440    PT Time Calculation (min) 42 min    Equipment Utilized During Treatment Gait belt    Activity Tolerance Patient tolerated treatment well    Behavior During Therapy WFL for tasks assessed/performed                                 Past Medical History:  Diagnosis Date   Abnormal Pap smear of cervix    Actinic keratosis 09/17/2023   R spinal mid upper back   Anxiety    Basal cell carcinoma 04/14/2008   Right upper ant. thigh. BCC with sclerosis.   Basal cell carcinoma 08/14/2008   Right lat. lower thigh. Superficial.    Basal cell carcinoma 04/29/2019   Right mid forearm. Superficial and nodular patterns. EDC.   Basal cell carcinoma 08/17/2020   Right upper antecubital. EDC.   Basal cell carcinoma 08/23/2021   Left Lower Leg, EDC   Basal cell carcinoma 09/17/2023   L medial lower leg, EDC   Kidney stones    MS (multiple sclerosis) (HCC)    UTI (urinary tract infection)    Past Surgical History:  Procedure Laterality Date   BREAST BIOPSY Right 2010   stereotactic biopsy/ neg/ dr.byrnett   BREAST BIOPSY Left 2012   neg/dr. brynett   CESAREAN SECTION  2008   RPH   TONSILLECTOMY  2006   There are no active problems to display for this patient.   ONSET DATE: diagnosed 12 years ago.   REFERRING DIAG: MS, gait and mobility   THERAPY DIAG:  Difficulty in walking, not elsewhere classified  Abnormality of gait and mobility  Unsteadiness on feet  Muscle weakness (generalized)  Rationale for Evaluation and  Treatment: Rehabilitation  SUBJECTIVE:                                                                                                                                                                                             SUBJECTIVE STATEMENT:  Pt reports that  she is feeling pretty  good today.  Pt accompanied by: self  PERTINENT HISTORY: Patient was seen by physical therapy until September of last year. Pmh includes MS, UTI, anxiety. Has extreme spasticity in LLE. Extreme cold and heat affect her LLE. Patient is now taking hormonal medication. Patient is a Chartered certified accountant.   PAIN:  Are you having pain? No  PRECAUTIONS: None  RED FLAGS: None   WEIGHT BEARING RESTRICTIONS: No  FALLS: Has patient fallen in last 6 months? No  LIVING ENVIRONMENT: Lives with: lives with their family Lives in: House/apartment Stairs: no stairs to enter, lives on second floor Has following equipment at home: Single point cane and shower chair  PLOF: Independent  PATIENT GOALS: continue walking as long as she can.   OBJECTIVE:  Note: Objective measures were completed at Evaluation unless otherwise noted.  DIAGNOSTIC FINDINGS: n/a  COGNITION: Overall cognitive status: Within functional limits for tasks assessed   SENSATION: WFL; slight decrease to RLE   COORDINATION: Heel slide: limited LLE    MUSCLE TONE: LLE: Mild and Moderate   POSTURE: No Significant postural limitations  LOWER EXTREMITY ROM:     WFL  LOWER EXTREMITY MMT:    MMT Right Eval Left Eval  Hip flexion 6.4 4.2  Hip extension    Hip abduction 7.9 3.4  Hip adduction 7.5 8.5  Hip internal rotation    Hip external rotation    Knee flexion 11.1 10.2  Knee extension 7.8 10  Ankle dorsiflexion 10.8 6.8  Ankle plantarflexion 7.2 6.9  Ankle inversion    Ankle eversion    (Blank rows = not tested)  BED MOBILITY:  Scooting into bed is very challenging at end of night  TRANSFERS: Assistive device  utilized: Single point cane  Sit to stand: Complete Independence Stand to sit: Complete Independence Chair to chair: Complete Independence    STAIRS: Level of Assistance: CGA Stair Negotiation Technique: Alternating Pattern  with Single Rail on Right Number of Stairs: 5  Height of Stairs: 6 inch    GAIT: Gait pattern: step through pattern and poor foot clearance- Left Distance walked: 60 ft Assistive device utilized: None Level of assistance: CGA Comments: L foot scuffing  FUNCTIONAL TESTS:  5 times sit to stand: 13 seconds with arms crossed.  6 minute walk test: next session  Functional gait assessment: 14/30  PATIENT SURVEYS:  ABC scale 60.5%                                                                                                                               TREATMENT DATE: 10/17/23   Gait belt donned with CGA for all standing activities, thorough therapeutic rest breaks provided throughout session   TherAct Weighted ambulation with 2# AW donned forward and backward- 46ft x3 reps forward, x3 backward   Step over orange hurdle x10 forward/backward at support bar Lateral step over orange hurdle x10 both directions at support bar   Seated:  Dynadisc B ankle  PF/DF x15 each direction Dynadisc B ankle inversion/eversion x15 each direction     Neuro Re-ed: - Standing twists on firm blue pad to place small foam balls (25) into basketball hoop on opposite side; 1 round from each side- pt had increased difficulty with twisting to her L side; noticeable ankle righting reaction seen throughout exercise     PATIENT EDUCATION: Education details: Exercise technique; importance of daily stretching Person educated: Patient Education method: Explanation, Demonstration, Tactile cues, and Verbal cues Education comprehension: verbalized understanding, returned demonstration, verbal cues required, and tactile cues required   HOME EXERCISE PROGRAM: Access Code:  [email or web address] URL: [email or web address] Date: 08/13/2023 Prepared by: Reyes London  Exercises - Hooklying Single Knee to Chest Stretch  - 1-2 x daily - 3 sets - 30 sec  hold - Supine Lower Trunk Rotation  - 1-2 x daily - 3 sets - 30 sec hold - Supine Hamstring Stretch with Strap  - 1-2 x daily - 3 sets - 30 sec hold - Seated Hamstring Stretch  - 1-2 x daily - 3 sets - 30 sec hold - Supine Hip Internal and External Rotation  - 1-2 x daily - 3 sets - 10 reps - Seated Hip External Rotation Stretch  - 1 x daily - 3 sets - 30 sec hold - Supine Figure 4 Piriformis Stretch  - 1 x daily - 3 sets - 30 sec hold       Access Code: [email or web address] URL: [email or web address] Date: 05/29/2023 Prepared by: Marina  Moser  Exercises - Towel Scrunches  - 1 x daily - 7 x weekly - 2 sets - 10 reps - 5 hold - Toe Spreading  - 1 x daily - 7 x weekly - 2 sets - 10 reps - 5 hold - Seated Ankle Alphabet  - 1 x daily - 7 x weekly - 2 sets - 1 reps - 5 hold  GOALS: Goals reviewed with patient? Yes  SHORT TERM GOALS: Target date: 05/22/2023  Patient will be independent in home exercise program to improve strength/mobility for better functional independence with ADLs. Baseline: 4/14: compliant  Goal status: MET     LONG TERM GOALS: Target date: 12/31/2023    Patient will increase Functional Gait Assessment score to >25/30 as to reduce fall risk and improve dynamic gait safety with community ambulation. Baseline: 1/28: 14/30  4/14: 17/30  6/11: 18/30 7/14: 20/30 Goal status: Partially Met  2.   Patient (< 61 years old) will complete five times sit to stand test in < 10 seconds indicating an increased LE strength and improved balance. Baseline: 1/28: 13 seconds 4/14: 10.72 seconds no hands 6/11: 10 seconds no hands  Goal status: MET  3.  Patient will increase six minute walk test distance to >1000 for progression to community ambulator and improve gait ability Baseline:  Perform next session 3/4: 820 ft 4/14: 890 ft with [facility]  6/11: 888 ft with [facility] 7/14: 870 ft with SPC- only one instance of L foot catching on ground throughout test Goal status: Partially Met   4.  Patient will return to nightly ambulation's with less than five foot scuffs per ambulation to return to PLOF.  Baseline: 1/28: limited ambulation 4/14: some limitations pending on day. Encouraged to perform multiple short walks 6/11: started doing nightly walks ; decreased foot scuffs  7/14: pt reports no scuffs Goal status: MET  5. Patient will increase ABC scale score >80% to demonstrate better functional mobility and better confidence with ADLs.  Baseline: 1/28:  60%  4/14: 65%  6/11: 68% 7/14: 59.38% Goal status: Partially Met   ASSESSMENT:  CLINICAL IMPRESSION:    Pt arrived with high motivation to participate in PT activities. Pt had increased difficulty with lifting L foot of of ground today to clear hurdle, but continues to demonstrate overall improvements in foot clearance with ambulation. Pt required more frequent rest breaks today due to fatigue, but continued to push herself throughout session to complete prescribed interventions. Patient is aware primary therapist will be out due to maternity.  Pt will continue to benefit from skilled therapy to address remaining deficits in order to improve overall QoL and return to PLOF.     OBJECTIVE IMPAIRMENTS: Abnormal gait, decreased activity tolerance, decreased balance, decreased coordination, decreased endurance, decreased mobility, difficulty walking, decreased strength, impaired perceived functional ability, impaired flexibility, impaired UE functional use, and improper body mechanics.   ACTIVITY LIMITATIONS: carrying, lifting, bending, sitting, standing, squatting, stairs, transfers, bed mobility, dressing, reach over head, hygiene/grooming, locomotion level, and caring for others  PARTICIPATION LIMITATIONS: meal prep, cleaning, laundry,  interpersonal relationship, driving, shopping, community activity, and occupation  PERSONAL FACTORS: Age, Fitness, Past/current experiences, Profession, Time since onset of injury/illness/exacerbation, and 1-2 comorbidities: MS anxiety are also affecting patient's functional outcome.   REHAB POTENTIAL: Good  CLINICAL DECISION MAKING: Evolving/moderate complexity  EVALUATION COMPLEXITY: Moderate  PLAN:  PT FREQUENCY: 2x/week  PT DURATION: 12 weeks  PLANNED INTERVENTIONS: 97164- PT Re-evaluation, 97110-Therapeutic exercises, 97530- Therapeutic activity, 97112- Neuromuscular re-education, 97535- Self Care, 02859- Manual therapy, [phone]- Gait training, [phone]- Orthotic Fit/training, [phone]- Canalith repositioning, Patient/Family education, Balance training, Stair training, Taping, Dry Needling, Joint mobilization, Joint manipulation, Spinal mobilization, Vestibular training, Visual/preceptual remediation/compensation, DME instructions, Cryotherapy, and Moist heat  PLAN FOR NEXT SESSION:  airex pad/balance, LLE strengthening, HEP , LLE coordination. Adding cognitive/manual dual task with dynamic balance activities     Janell Axe, SPT  This entire session was performed under direct supervision and direction of a licensed therapist/therapist assistant . I have personally read, edited and approve of the note as written.  Marina  Leopoldo, PT, DPT Physical Therapist - [facility]  Outpatient Physical Therapy- [facility] [phone]     10/17/23, 3:40 PM

## 2023-10-19 ENCOUNTER — Other Ambulatory Visit: Payer: Self-pay | Admitting: Internal Medicine

## 2023-10-19 DIAGNOSIS — Z1231 Encounter for screening mammogram for malignant neoplasm of breast: Secondary | ICD-10-CM

## 2023-10-22 ENCOUNTER — Ambulatory Visit

## 2023-10-22 DIAGNOSIS — R262 Difficulty in walking, not elsewhere classified: Secondary | ICD-10-CM

## 2023-10-22 DIAGNOSIS — R2681 Unsteadiness on feet: Secondary | ICD-10-CM

## 2023-10-22 DIAGNOSIS — R269 Unspecified abnormalities of gait and mobility: Secondary | ICD-10-CM

## 2023-10-22 DIAGNOSIS — Z9181 History of falling: Secondary | ICD-10-CM

## 2023-10-22 DIAGNOSIS — M6281 Muscle weakness (generalized): Secondary | ICD-10-CM

## 2023-10-22 NOTE — Therapy (Signed)
 OUTPATIENT PHYSICAL THERAPY NEURO TREATMENT/PHYSICAL THERAPY PROGRESS NOTE   Dates of reporting period  09/05/23   to   10/22/23  Patient Name: Michelle Ruiz MRN: [id number] DOB:[birth year], 53 y.o., female Today's Date: 10/22/2023   PCP: Sherial Bail  REFERRING PROVIDER: Maree Hila K  END OF SESSION:  PT End of Session - 10/22/23 1150     Visit Number 30    Number of Visits 51    Date for PT Re-Evaluation 12/31/23    Progress Note Due on Visit 70    PT Start Time 1150    PT Stop Time 1230    PT Time Calculation (min) 40 min    Equipment Utilized During Treatment Gait belt    Activity Tolerance Patient tolerated treatment well    Behavior During Therapy WFL for tasks assessed/performed            Past Medical History:  Diagnosis Date   Abnormal Pap smear of cervix    Actinic keratosis 09/17/2023   R spinal mid upper back   Anxiety    Basal cell carcinoma 04/14/2008   Right upper ant. thigh. BCC with sclerosis.   Basal cell carcinoma 08/14/2008   Right lat. lower thigh. Superficial.    Basal cell carcinoma 04/29/2019   Right mid forearm. Superficial and nodular patterns. EDC.   Basal cell carcinoma 08/17/2020   Right upper antecubital. EDC.   Basal cell carcinoma 08/23/2021   Left Lower Leg, EDC   Basal cell carcinoma 09/17/2023   L medial lower leg, EDC   Kidney stones    MS (multiple sclerosis) (HCC)    UTI (urinary tract infection)    Past Surgical History:  Procedure Laterality Date   BREAST BIOPSY Right 2010   stereotactic biopsy/ neg/ dr.byrnett   BREAST BIOPSY Left 2012   neg/dr. brynett   CESAREAN SECTION  2008   RPH   TONSILLECTOMY  2006   There are no active problems to display for this patient.   ONSET DATE: diagnosed 12 years ago.   REFERRING DIAG: MS, gait and mobility   THERAPY DIAG:  Difficulty in walking, not elsewhere classified  Abnormality of gait and mobility  Unsteadiness on feet  Muscle weakness  (generalized)  History of falling  Rationale for Evaluation and Treatment: Rehabilitation  SUBJECTIVE:                                                                                                                                                                                             SUBJECTIVE STATEMENT:   Pt reports she is doing well but is not wanting to do  all of her goal assessment at this time.  Pt accompanied by: self  PERTINENT HISTORY: Patient was seen by physical therapy until September of last year. Pmh includes MS, UTI, anxiety. Has extreme spasticity in LLE. Extreme cold and heat affect her LLE. Patient is now taking hormonal medication. Patient is a Chartered certified accountant.   PAIN:  Are you having pain? No  PRECAUTIONS: None  RED FLAGS: None   WEIGHT BEARING RESTRICTIONS: No  FALLS: Has patient fallen in last 6 months? No  LIVING ENVIRONMENT: Lives with: lives with their family Lives in: House/apartment Stairs: no stairs to enter, lives on second floor Has following equipment at home: Single point cane and shower chair  PLOF: Independent  PATIENT GOALS: continue walking as long as she can.   OBJECTIVE:  Note: Objective measures were completed at Evaluation unless otherwise noted.  DIAGNOSTIC FINDINGS: n/a  COGNITION: Overall cognitive status: Within functional limits for tasks assessed   SENSATION: WFL; slight decrease to RLE   COORDINATION: Heel slide: limited LLE    MUSCLE TONE: LLE: Mild and Moderate   POSTURE: No Significant postural limitations  LOWER EXTREMITY ROM:     WFL  LOWER EXTREMITY MMT:    MMT Right Eval Left Eval  Hip flexion 6.4 4.2  Hip extension    Hip abduction 7.9 3.4  Hip adduction 7.5 8.5  Hip internal rotation    Hip external rotation    Knee flexion 11.1 10.2  Knee extension 7.8 10  Ankle dorsiflexion 10.8 6.8  Ankle plantarflexion 7.2 6.9  Ankle inversion    Ankle eversion    (Blank rows = not  tested)  BED MOBILITY:  Scooting into bed is very challenging at end of night  TRANSFERS: Assistive device utilized: Single point cane  Sit to stand: Complete Independence Stand to sit: Complete Independence Chair to chair: Complete Independence    STAIRS: Level of Assistance: CGA Stair Negotiation Technique: Alternating Pattern  with Single Rail on Right Number of Stairs: 5  Height of Stairs: 6 inch    GAIT: Gait pattern: step through pattern and poor foot clearance- Left Distance walked: 60 ft Assistive device utilized: None Level of assistance: CGA Comments: L foot scuffing  FUNCTIONAL TESTS:  5 times sit to stand: 13 seconds with arms crossed.  6 minute walk test: next session  Functional gait assessment: 14/30  PATIENT SURVEYS:  ABC scale 60.5%                                                                                                                               TREATMENT DATE: 10/22/23     Gait belt donned with CGA for all standing activities, thorough therapeutic rest breaks provided throughout session.  TherAct:  Weighted ambulation with 3# AW donned 150' x3 total laps  Step over orange hurdle x10 forward/backward at support bar Lateral step over orange hurdle x10 both directions at support bar  Seated:   Dynadisc  B ankle PF/DF x15 each direction Dynadisc B ankle inversion/eversion x15 each direction  Goal assessment and discussion for new goal in order to promote endurance for the pt and listed below.     PATIENT EDUCATION: Education details: Exercise technique; importance of daily stretching Person educated: Patient Education method: Explanation, Demonstration, Tactile cues, and Verbal cues Education comprehension: verbalized understanding, returned demonstration, verbal cues required, and tactile cues required   HOME EXERCISE PROGRAM: Access Code: [email or web address] URL: [email or web address] Date: 08/13/2023 Prepared by: Reyes London  Exercises - Hooklying Single Knee to Chest Stretch  - 1-2 x daily - 3 sets - 30 sec  hold - Supine Lower Trunk Rotation  - 1-2 x daily - 3 sets - 30 sec hold - Supine Hamstring Stretch with Strap  - 1-2 x daily - 3 sets - 30 sec hold - Seated Hamstring Stretch  - 1-2 x daily - 3 sets - 30 sec hold - Supine Hip Internal and External Rotation  - 1-2 x daily - 3 sets - 10 reps - Seated Hip External Rotation Stretch  - 1 x daily - 3 sets - 30 sec hold - Supine Figure 4 Piriformis Stretch  - 1 x daily - 3 sets - 30 sec hold   Access Code: [email or web address] URL: [email or web address] Date: 05/29/2023 Prepared by: Marina  Moser  Exercises - Towel Scrunches  - 1 x daily - 7 x weekly - 2 sets - 10 reps - 5 hold - Toe Spreading  - 1 x daily - 7 x weekly - 2 sets - 10 reps - 5 hold - Seated Ankle Alphabet  - 1 x daily - 7 x weekly - 2 sets - 1 reps - 5 hold  GOALS: Goals reviewed with patient? Yes  SHORT TERM GOALS: Target date: 05/22/2023  Patient will be independent in home exercise program to improve strength/mobility for better functional independence with ADLs. Baseline: 4/14: compliant  Goal status: MET    LONG TERM GOALS: Target date: 12/31/2023  Patient will increase Functional Gait Assessment score to >25/30 as to reduce fall risk and improve dynamic gait safety with community ambulation. Baseline: 1/28: 14/30   4/14: 17/30   6/11: 18/30  7/14: 20/30 Goal status: Partially Met  2.   Patient (< 33 years old) will complete five times sit to stand test in < 10 seconds indicating an increased LE strength and improved balance. Baseline: 1/28: 13 seconds  4/14: 10.72 seconds no hands  6/11: 10 seconds no hands  Goal status: MET  3.  Patient will increase six minute walk test distance to >1000 for progression to community ambulator and improve gait ability Baseline: Perform next session 3/4: 820 ft  4/14: 890 ft with [facility]   6/11: 888 ft with [facility]  7/14: 870 ft  with [facility]- only one instance of L foot catching on ground throughout test  Goal status: Partially Met   4.  Patient will return to nightly ambulation's with less than five foot scuffs per ambulation to return to PLOF.  Baseline: 1/28: limited ambulation  4/14: some limitations pending on day. Encouraged to perform multiple short walks  6/11: started doing nightly walks ; decreased foot scuffs   7/14: pt reports no scuffs Goal status: MET  5. Patient will increase ABC scale score >80% to demonstrate better functional mobility and better confidence with ADLs.  Baseline: 1/28: 60%   4/14: 65%   6/11: 68%  7/14: 59.38% Goal status: Partially Met  6.  Pt will  complete 20 minutes of low-impact cardio exercise (such as stationary cycling) 3 times per week for the next 6 weeks, gradually increasing the duration by 5 minutes every 2 weeks, to improve my cardiovascular endurance while monitoring for signs of fatigue or overheating. Baseline: 10/22/23: pt able to achieve 1 bout of 20 min in a week Goal Status: NEW   ASSESSMENT:  CLINICAL IMPRESSION:    Pt reports she would like to hold off on goal assessment at this time, as she just went over those in the past 2 weeks.  Pt noted she would like to build her endurance levels, however has been unable to walk at home, so a stationary cycling goal as been made for the pt.  Pt ultimately performed ambulation to challenge endurance levels by utilizing increased ankle weights donned on the LE's.   Pt will continue to benefit from skilled therapy to address remaining deficits in order to improve overall QoL and return to PLOF.       OBJECTIVE IMPAIRMENTS: Abnormal gait, decreased activity tolerance, decreased balance, decreased coordination, decreased endurance, decreased mobility, difficulty walking, decreased strength, impaired perceived functional ability, impaired flexibility, impaired UE functional use, and improper body mechanics.   ACTIVITY  LIMITATIONS: carrying, lifting, bending, sitting, standing, squatting, stairs, transfers, bed mobility, dressing, reach over head, hygiene/grooming, locomotion level, and caring for others  PARTICIPATION LIMITATIONS: meal prep, cleaning, laundry, interpersonal relationship, driving, shopping, community activity, and occupation  PERSONAL FACTORS: Age, Fitness, Past/current experiences, Profession, Time since onset of injury/illness/exacerbation, and 1-2 comorbidities: MS anxiety are also affecting patient's functional outcome.   REHAB POTENTIAL: Good  CLINICAL DECISION MAKING: Evolving/moderate complexity  EVALUATION COMPLEXITY: Moderate  PLAN:  PT FREQUENCY: 2x/week  PT DURATION: 12 weeks  PLANNED INTERVENTIONS: 97164- PT Re-evaluation, 97110-Therapeutic exercises, 97530- Therapeutic activity, 97112- Neuromuscular re-education, 97535- Self Care, 02859- Manual therapy, [phone]- Gait training, [phone]- Orthotic Fit/training, [phone]- Canalith repositioning, Patient/Family education, Balance training, Stair training, Taping, Dry Needling, Joint mobilization, Joint manipulation, Spinal mobilization, Vestibular training, Visual/preceptual remediation/compensation, DME instructions, Cryotherapy, and Moist heat  PLAN FOR NEXT SESSION:  airex pad/balance, LLE strengthening, HEP , LLE coordination. Adding cognitive/manual dual task with dynamic balance activities     Fonda Simpers, PT, DPT Physical Therapist - [facility]  [facility]  10/22/23, 11:50 AM

## 2023-10-23 ENCOUNTER — Ambulatory Visit

## 2023-10-24 ENCOUNTER — Ambulatory Visit

## 2023-10-29 ENCOUNTER — Ambulatory Visit: Attending: Neurology

## 2023-10-29 DIAGNOSIS — M25521 Pain in right elbow: Secondary | ICD-10-CM | POA: Insufficient documentation

## 2023-10-29 DIAGNOSIS — R262 Difficulty in walking, not elsewhere classified: Secondary | ICD-10-CM | POA: Diagnosis present

## 2023-10-29 DIAGNOSIS — M6281 Muscle weakness (generalized): Secondary | ICD-10-CM | POA: Insufficient documentation

## 2023-10-29 DIAGNOSIS — M25621 Stiffness of right elbow, not elsewhere classified: Secondary | ICD-10-CM | POA: Diagnosis present

## 2023-10-29 DIAGNOSIS — R2681 Unsteadiness on feet: Secondary | ICD-10-CM | POA: Diagnosis present

## 2023-10-29 DIAGNOSIS — Z9181 History of falling: Secondary | ICD-10-CM | POA: Diagnosis present

## 2023-10-29 DIAGNOSIS — R269 Unspecified abnormalities of gait and mobility: Secondary | ICD-10-CM | POA: Insufficient documentation

## 2023-10-29 NOTE — Therapy (Signed)
 OUTPATIENT PHYSICAL THERAPY NEURO TREATMENT  Patient Name: Michelle Ruiz MRN: [id number] DOB:07-28-1970, 53 y.o., female Today's Date: 10/29/2023   PCP: Sherial Bail  REFERRING PROVIDER: Maree Hila K  END OF SESSION:  PT End of Session - 10/29/23 1319     Visit Number 31    Number of Visits 51    Date for PT Re-Evaluation 12/31/23    Progress Note Due on Visit 70    PT Start Time 1319    PT Stop Time 1400    PT Time Calculation (min) 41 min    Equipment Utilized During Treatment Gait belt    Activity Tolerance Patient tolerated treatment well    Behavior During Therapy WFL for tasks assessed/performed            Past Medical History:  Diagnosis Date   Abnormal Pap smear of cervix    Actinic keratosis 09/17/2023   R spinal mid upper back   Anxiety    Basal cell carcinoma 04/14/2008   Right upper ant. thigh. BCC with sclerosis.   Basal cell carcinoma 08/14/2008   Right lat. lower thigh. Superficial.    Basal cell carcinoma 04/29/2019   Right mid forearm. Superficial and nodular patterns. EDC.   Basal cell carcinoma 08/17/2020   Right upper antecubital. EDC.   Basal cell carcinoma 08/23/2021   Left Lower Leg, EDC   Basal cell carcinoma 09/17/2023   L medial lower leg, EDC   Kidney stones    MS (multiple sclerosis) (HCC)    UTI (urinary tract infection)    Past Surgical History:  Procedure Laterality Date   BREAST BIOPSY Right 2010   stereotactic biopsy/ neg/ dr.byrnett   BREAST BIOPSY Left 2012   neg/dr. brynett   CESAREAN SECTION  2008   RPH   TONSILLECTOMY  2006   There are no active problems to display for this patient.   ONSET DATE: diagnosed 12 years ago.   REFERRING DIAG: MS, gait and mobility   THERAPY DIAG:  Difficulty in walking, not elsewhere classified  Abnormality of gait and mobility  Unsteadiness on feet  Muscle weakness (generalized)  Rationale for Evaluation and Treatment: Rehabilitation  SUBJECTIVE:                                                                                                                                                                                              SUBJECTIVE STATEMENT:   Pt reports she was able to attempt stationary biking over the last week.  She performed 3x last week for 20 minutes each session.  Pt notes her muscles are sore but she  plans on performing this week and increasing by 2.5 minutes.  Pt accompanied by: self  PERTINENT HISTORY: Patient was seen by physical therapy until September of last year. Pmh includes MS, UTI, anxiety. Has extreme spasticity in LLE. Extreme cold and heat affect her LLE. Patient is now taking hormonal medication. Patient is a Chartered certified accountant.   PAIN:  Are you having pain? No  PRECAUTIONS: None  RED FLAGS: None   WEIGHT BEARING RESTRICTIONS: No  FALLS: Has patient fallen in last 6 months? No  LIVING ENVIRONMENT: Lives with: lives with their family Lives in: House/apartment Stairs: no stairs to enter, lives on second floor Has following equipment at home: Single point cane and shower chair  PLOF: Independent  PATIENT GOALS: continue walking as long as she can.   OBJECTIVE:  Note: Objective measures were completed at Evaluation unless otherwise noted.  DIAGNOSTIC FINDINGS: n/a  COGNITION: Overall cognitive status: Within functional limits for tasks assessed   SENSATION: WFL; slight decrease to RLE   COORDINATION: Heel slide: limited LLE    MUSCLE TONE: LLE: Mild and Moderate   POSTURE: No Significant postural limitations  LOWER EXTREMITY ROM:     WFL  LOWER EXTREMITY MMT:    MMT Right Eval Left Eval  Hip flexion 6.4 4.2  Hip extension    Hip abduction 7.9 3.4  Hip adduction 7.5 8.5  Hip internal rotation    Hip external rotation    Knee flexion 11.1 10.2  Knee extension 7.8 10  Ankle dorsiflexion 10.8 6.8  Ankle plantarflexion 7.2 6.9  Ankle inversion    Ankle eversion    (Blank rows =  not tested)  BED MOBILITY:  Scooting into bed is very challenging at end of night  TRANSFERS: Assistive device utilized: Single point cane  Sit to stand: Complete Independence Stand to sit: Complete Independence Chair to chair: Complete Independence    STAIRS: Level of Assistance: CGA Stair Negotiation Technique: Alternating Pattern  with Single Rail on Right Number of Stairs: 5  Height of Stairs: 6 inch    GAIT: Gait pattern: step through pattern and poor foot clearance- Left Distance walked: 60 ft Assistive device utilized: None Level of assistance: CGA Comments: L foot scuffing  FUNCTIONAL TESTS:  5 times sit to stand: 13 seconds with arms crossed.  6 minute walk test: next session  Functional gait assessment: 14/30  PATIENT SURVEYS:  ABC scale 60.5%                                                                                                                               TREATMENT DATE: 10/29/23   Gait belt donned with CGA for all standing activities, thorough therapeutic rest breaks provided throughout session.  TherAct:  Patient ambulated 25 min indoor/outdoor today- including tile, concrete, carpet, horizontal head turns, on varied surfaces: inclines, declines- with no seated rest break(s) - CGA for all indoor/outdoor - 0 instances of instability when ambulating -  Focusing on overall endurance and to improve ambulation across different terrain that pt would come in contact with in the community.  3# AW donned for all ambulation  Stair training as pt reports that she had difficulty performing steps at the movie theater 3# AW donned, 4 steps with hand rail support for balance, x10 attempts   Seated:   LAQ with 3# AW donned bilaterally, 2x10 Marches with 3# AW donned bilaterally, 2x10  Exercises utilized for hip and foot clearance necessary for curb navigation     PATIENT EDUCATION: Education details: Exercise technique; importance of daily  stretching Person educated: Patient Education method: Explanation, Demonstration, Tactile cues, and Verbal cues Education comprehension: verbalized understanding, returned demonstration, verbal cues required, and tactile cues required   HOME EXERCISE PROGRAM: Access Code: [email or web address] URL: [email or web address] Date: 08/13/2023 Prepared by: Reyes London  Exercises - Hooklying Single Knee to Chest Stretch  - 1-2 x daily - 3 sets - 30 sec  hold - Supine Lower Trunk Rotation  - 1-2 x daily - 3 sets - 30 sec hold - Supine Hamstring Stretch with Strap  - 1-2 x daily - 3 sets - 30 sec hold - Seated Hamstring Stretch  - 1-2 x daily - 3 sets - 30 sec hold - Supine Hip Internal and External Rotation  - 1-2 x daily - 3 sets - 10 reps - Seated Hip External Rotation Stretch  - 1 x daily - 3 sets - 30 sec hold - Supine Figure 4 Piriformis Stretch  - 1 x daily - 3 sets - 30 sec hold   Access Code: [email or web address] URL: [email or web address] Date: 05/29/2023 Prepared by: Marina  Moser  Exercises - Towel Scrunches  - 1 x daily - 7 x weekly - 2 sets - 10 reps - 5 hold - Toe Spreading  - 1 x daily - 7 x weekly - 2 sets - 10 reps - 5 hold - Seated Ankle Alphabet  - 1 x daily - 7 x weekly - 2 sets - 1 reps - 5 hold  GOALS: Goals reviewed with patient? Yes  SHORT TERM GOALS: Target date: 05/22/2023  Patient will be independent in home exercise program to improve strength/mobility for better functional independence with ADLs. Baseline: 4/14: compliant  Goal status: MET    LONG TERM GOALS: Target date: 12/31/2023  Patient will increase Functional Gait Assessment score to >25/30 as to reduce fall risk and improve dynamic gait safety with community ambulation. Baseline: 1/28: 14/30   4/14: 17/30   6/11: 18/30  7/14: 20/30 Goal status: Partially Met  2.   Patient (< 60 years old) will complete five times sit to stand test in < 10 seconds indicating an increased LE strength  and improved balance. Baseline: 1/28: 13 seconds  4/14: 10.72 seconds no hands  6/11: 10 seconds no hands  Goal status: MET  3.  Patient will increase six minute walk test distance to >1000 for progression to community ambulator and improve gait ability Baseline: Perform next session 3/4: 820 ft  4/14: 890 ft with [facility]   6/11: 888 ft with [facility]  7/14: 870 ft with SPC- only one instance of L foot catching on ground throughout test  Goal status: Partially Met   4.  Patient will return to nightly ambulation's with less than five foot scuffs per ambulation to return to PLOF.  Baseline: 1/28: limited ambulation  4/14: some limitations pending on day. Encouraged to perform multiple short walks  6/11: started doing nightly walks ;  decreased foot scuffs   7/14: pt reports no scuffs Goal status: MET  5. Patient will increase ABC scale score >80% to demonstrate better functional mobility and better confidence with ADLs.  Baseline: 1/28: 60%   4/14: 65%   6/11: 68%  7/14: 59.38% Goal status: Partially Met  6.  Pt will complete 20 minutes of low-impact cardio exercise (such as stationary cycling) 3 times per week for the next 6 weeks, gradually increasing the duration by 5 minutes every 2 weeks, to improve my cardiovascular endurance while monitoring for signs of fatigue or overheating. Baseline: 10/22/23: pt able to achieve 1 bout of 20 min in a week Goal Status: NEW   ASSESSMENT:  CLINICAL IMPRESSION:    Pt performed well with the tasks given and was able to challenge both her endurance levels and her ability to perform stair training.  Pt reports she was fatigued when attempting to go to the theater and climb the steps to get to her seat, so therapist challenged her ability to perform further ambulation attempts with resistance, along with stair training.  Pt reported it to be challenging, but appreciative of the challenge.  Pt encouraged to continue with riding the bike in order to improve  time towards newly created goal.   Pt will continue to benefit from skilled therapy to address remaining deficits in order to improve overall QoL and return to PLOF.          OBJECTIVE IMPAIRMENTS: Abnormal gait, decreased activity tolerance, decreased balance, decreased coordination, decreased endurance, decreased mobility, difficulty walking, decreased strength, impaired perceived functional ability, impaired flexibility, impaired UE functional use, and improper body mechanics.   ACTIVITY LIMITATIONS: carrying, lifting, bending, sitting, standing, squatting, stairs, transfers, bed mobility, dressing, reach over head, hygiene/grooming, locomotion level, and caring for others  PARTICIPATION LIMITATIONS: meal prep, cleaning, laundry, interpersonal relationship, driving, shopping, community activity, and occupation  PERSONAL FACTORS: Age, Fitness, Past/current experiences, Profession, Time since onset of injury/illness/exacerbation, and 1-2 comorbidities: MS anxiety are also affecting patient's functional outcome.   REHAB POTENTIAL: Good  CLINICAL DECISION MAKING: Evolving/moderate complexity  EVALUATION COMPLEXITY: Moderate  PLAN:  PT FREQUENCY: 2x/week  PT DURATION: 12 weeks  PLANNED INTERVENTIONS: 97164- PT Re-evaluation, 97110-Therapeutic exercises, 97530- Therapeutic activity, 97112- Neuromuscular re-education, 97535- Self Care, 02859- Manual therapy, [phone]- Gait training, [phone]- Orthotic Fit/training, [phone]- Canalith repositioning, Patient/Family education, Balance training, Stair training, Taping, Dry Needling, Joint mobilization, Joint manipulation, Spinal mobilization, Vestibular training, Visual/preceptual remediation/compensation, DME instructions, Cryotherapy, and Moist heat  PLAN FOR NEXT SESSION:  airex pad/balance, LLE strengthening, HEP , LLE coordination. Adding cognitive/manual dual task with dynamic balance activities     Fonda Simpers, PT, DPT Physical Therapist -  [facility]  [facility]  10/29/23, 1:19 PM

## 2023-10-31 ENCOUNTER — Ambulatory Visit

## 2023-10-31 DIAGNOSIS — Z9181 History of falling: Secondary | ICD-10-CM

## 2023-10-31 DIAGNOSIS — M6281 Muscle weakness (generalized): Secondary | ICD-10-CM

## 2023-10-31 DIAGNOSIS — R262 Difficulty in walking, not elsewhere classified: Secondary | ICD-10-CM | POA: Diagnosis not present

## 2023-10-31 DIAGNOSIS — M25521 Pain in right elbow: Secondary | ICD-10-CM

## 2023-10-31 DIAGNOSIS — M25621 Stiffness of right elbow, not elsewhere classified: Secondary | ICD-10-CM

## 2023-10-31 DIAGNOSIS — R269 Unspecified abnormalities of gait and mobility: Secondary | ICD-10-CM

## 2023-10-31 DIAGNOSIS — R2681 Unsteadiness on feet: Secondary | ICD-10-CM

## 2023-10-31 NOTE — Therapy (Signed)
 OUTPATIENT PHYSICAL THERAPY NEURO TREATMENT  Patient Name: Michelle Ruiz MRN: [id number] DOB:Mar 10, 1971, 53 y.o., female Today's Date: 10/31/2023   PCP: Sherial Bail  REFERRING PROVIDER: Maree Hila K  END OF SESSION:  PT End of Session - 10/31/23 1316     Visit Number 32    Number of Visits 51    Date for PT Re-Evaluation 12/31/23    Progress Note Due on Visit 70    PT Start Time 1316    PT Stop Time 1400    PT Time Calculation (min) 44 min    Equipment Utilized During Treatment Gait belt    Activity Tolerance Patient tolerated treatment well    Behavior During Therapy WFL for tasks assessed/performed            Past Medical History:  Diagnosis Date   Abnormal Pap smear of cervix    Actinic keratosis 09/17/2023   R spinal mid upper back   Anxiety    Basal cell carcinoma 04/14/2008   Right upper ant. thigh. BCC with sclerosis.   Basal cell carcinoma 08/14/2008   Right lat. lower thigh. Superficial.    Basal cell carcinoma 04/29/2019   Right mid forearm. Superficial and nodular patterns. EDC.   Basal cell carcinoma 08/17/2020   Right upper antecubital. EDC.   Basal cell carcinoma 08/23/2021   Left Lower Leg, EDC   Basal cell carcinoma 09/17/2023   L medial lower leg, EDC   Kidney stones    MS (multiple sclerosis) (HCC)    UTI (urinary tract infection)    Past Surgical History:  Procedure Laterality Date   BREAST BIOPSY Right 2010   stereotactic biopsy/ neg/ dr.byrnett   BREAST BIOPSY Left 2012   neg/dr. brynett   CESAREAN SECTION  2008   RPH   TONSILLECTOMY  2006   There are no active problems to display for this patient.   ONSET DATE: diagnosed 12 years ago.   REFERRING DIAG: MS, gait and mobility   THERAPY DIAG:  Difficulty in walking, not elsewhere classified  Abnormality of gait and mobility  Unsteadiness on feet  Muscle weakness (generalized)  History of falling  Pain in right elbow  Stiffness of right elbow, not elsewhere  classified  Rationale for Evaluation and Treatment: Rehabilitation  SUBJECTIVE:                                                                                                                                                                                             SUBJECTIVE STATEMENT:   Pt reports that she felt really good after the last visit and wasn't even fatigued like  she thought she would be.  Pt also reports that her team has been very encouraging of her moving more at work and making sure she does not miss her PT appointments.    Pt accompanied by: self  PERTINENT HISTORY: Patient was seen by physical therapy until September of last year. Pmh includes MS, UTI, anxiety. Has extreme spasticity in LLE. Extreme cold and heat affect her LLE. Patient is now taking hormonal medication. Patient is a Chartered certified accountant.   PAIN:  Are you having pain? No  PRECAUTIONS: None  RED FLAGS: None   WEIGHT BEARING RESTRICTIONS: No  FALLS: Has patient fallen in last 6 months? No  LIVING ENVIRONMENT: Lives with: lives with their family Lives in: House/apartment Stairs: no stairs to enter, lives on second floor Has following equipment at home: Single point cane and shower chair  PLOF: Independent  PATIENT GOALS: continue walking as long as she can.   OBJECTIVE:  Note: Objective measures were completed at Evaluation unless otherwise noted.  DIAGNOSTIC FINDINGS: n/a  COGNITION: Overall cognitive status: Within functional limits for tasks assessed   SENSATION: WFL; slight decrease to RLE   COORDINATION: Heel slide: limited LLE   MUSCLE TONE: LLE: Mild and Moderate  POSTURE: No Significant postural limitations  LOWER EXTREMITY ROM:     WFL  LOWER EXTREMITY MMT:    MMT Right Eval Left Eval  Hip flexion 6.4 4.2  Hip extension    Hip abduction 7.9 3.4  Hip adduction 7.5 8.5  Hip internal rotation    Hip external rotation    Knee flexion 11.1 10.2  Knee extension  7.8 10  Ankle dorsiflexion 10.8 6.8  Ankle plantarflexion 7.2 6.9  Ankle inversion    Ankle eversion    (Blank rows = not tested)  BED MOBILITY:  Scooting into bed is very challenging at end of night  TRANSFERS: Assistive device utilized: Single point cane  Sit to stand: Complete Independence Stand to sit: Complete Independence Chair to chair: Complete Independence  STAIRS: Level of Assistance: CGA Stair Negotiation Technique: Alternating Pattern  with Single Rail on Right Number of Stairs: 5  Height of Stairs: 6 inch   GAIT: Gait pattern: step through pattern and poor foot clearance- Left Distance walked: 60 ft Assistive device utilized: None Level of assistance: CGA Comments: L foot scuffing  FUNCTIONAL TESTS:  5 times sit to stand: 13 seconds with arms crossed.  6 minute walk test: next session  Functional gait assessment: 14/30  PATIENT SURVEYS:  ABC scale 60.5%                                                                                                                               TREATMENT DATE: 10/31/23   Gait belt donned with CGA for all standing activities, thorough therapeutic rest breaks provided throughout session.    TherAct:  Patient ambulated 15 min indoor today - with one seated rest break - CGA for  all indoor - 3 instances of instability when ambulating - Focusing on overall endurance and to improve foot clearance with use of AW's donned.  3# AW donned for all ambulation  Seated:   LAQ with 3# AW donned bilaterally, 2x15 Marches with 3# AW donned bilaterally, 2x15  STS with no UE support, 2x10  Exercises utilized for hip and foot clearance necessary for curb navigation   Manual:  Pr reporting significant pain in the R shoulder that is making it harder to use her cane and be safe with mobility, so this was addressed during session.  Supine long axis distraction for increasing joint spacing for pain modulation and increased ROM, 30 sec  bouts x3 Supine GHJ AP/Inferior glides, grades II-III, for pain modulation and improving ROM STM utilized to the biceps, deltoids, UT, and subscapular region for pain management and TP release technique utilized    PATIENT EDUCATION: Education details: Exercise technique; importance of daily stretching Person educated: Patient Education method: Explanation, Demonstration, Tactile cues, and Verbal cues Education comprehension: verbalized understanding, returned demonstration, verbal cues required, and tactile cues required   HOME EXERCISE PROGRAM: Access Code: [email or web address] URL: [email or web address] Date: 08/13/2023 Prepared by: Reyes London  Exercises - Hooklying Single Knee to Chest Stretch  - 1-2 x daily - 3 sets - 30 sec  hold - Supine Lower Trunk Rotation  - 1-2 x daily - 3 sets - 30 sec hold - Supine Hamstring Stretch with Strap  - 1-2 x daily - 3 sets - 30 sec hold - Seated Hamstring Stretch  - 1-2 x daily - 3 sets - 30 sec hold - Supine Hip Internal and External Rotation  - 1-2 x daily - 3 sets - 10 reps - Seated Hip External Rotation Stretch  - 1 x daily - 3 sets - 30 sec hold - Supine Figure 4 Piriformis Stretch  - 1 x daily - 3 sets - 30 sec hold   Access Code: [email or web address] URL: [email or web address] Date: 05/29/2023 Prepared by: Marina  Moser  Exercises - Towel Scrunches  - 1 x daily - 7 x weekly - 2 sets - 10 reps - 5 hold - Toe Spreading  - 1 x daily - 7 x weekly - 2 sets - 10 reps - 5 hold - Seated Ankle Alphabet  - 1 x daily - 7 x weekly - 2 sets - 1 reps - 5 hold  GOALS: Goals reviewed with patient? Yes  SHORT TERM GOALS: Target date: 05/22/2023  Patient will be independent in home exercise program to improve strength/mobility for better functional independence with ADLs. Baseline: 4/14: compliant  Goal status: MET    LONG TERM GOALS: Target date: 12/31/2023  Patient will increase Functional Gait Assessment score to >25/30 as  to reduce fall risk and improve dynamic gait safety with community ambulation. Baseline: 1/28: 14/30   4/14: 17/30   6/11: 18/30  7/14: 20/30 Goal status: Partially Met  2.   Patient (< 6 years old) will complete five times sit to stand test in < 10 seconds indicating an increased LE strength and improved balance. Baseline: 1/28: 13 seconds  4/14: 10.72 seconds no hands  6/11: 10 seconds no hands  Goal status: MET  3.  Patient will increase six minute walk test distance to >1000 for progression to community ambulator and improve gait ability Baseline: Perform next session 3/4: 820 ft  4/14: 890 ft with [facility]   6/11: 888 ft with [facility]  7/14: 870 ft with [facility]- only one instance  of L foot catching on ground throughout test  Goal status: Partially Met   4.  Patient will return to nightly ambulation's with less than five foot scuffs per ambulation to return to PLOF.  Baseline: 1/28: limited ambulation  4/14: some limitations pending on day. Encouraged to perform multiple short walks  6/11: started doing nightly walks ; decreased foot scuffs   7/14: pt reports no scuffs Goal status: MET  5. Patient will increase ABC scale score >80% to demonstrate better functional mobility and better confidence with ADLs.  Baseline: 1/28: 60%   4/14: 65%   6/11: 68%  7/14: 59.38% Goal status: Partially Met  6.  Pt will complete 20 minutes of low-impact cardio exercise (such as stationary cycling) 3 times per week for the next 6 weeks, gradually increasing the duration by 5 minutes every 2 weeks, to improve my cardiovascular endurance while monitoring for signs of fatigue or overheating. Baseline: 10/22/23: pt able to achieve 1 bout of 20 min in a week Goal Status: NEW   ASSESSMENT:  CLINICAL IMPRESSION:    Pt responded well to the manual therapy noting a complete reduction in overall pain levels in the R shoulder upon leaving.  Pt noted to have better use of the R UE with the cane following the  treatment session as well.  As far as ambulation, pt with increased difficulty today noting 3 LOB that were close to uncontrolled descents however therapist able to assist in preventing fall.  Pt encouraged to continue with current HEP.   Pt will continue to benefit from skilled therapy to address remaining deficits in order to improve overall QoL and return to PLOF.           OBJECTIVE IMPAIRMENTS: Abnormal gait, decreased activity tolerance, decreased balance, decreased coordination, decreased endurance, decreased mobility, difficulty walking, decreased strength, impaired perceived functional ability, impaired flexibility, impaired UE functional use, and improper body mechanics.   ACTIVITY LIMITATIONS: carrying, lifting, bending, sitting, standing, squatting, stairs, transfers, bed mobility, dressing, reach over head, hygiene/grooming, locomotion level, and caring for others  PARTICIPATION LIMITATIONS: meal prep, cleaning, laundry, interpersonal relationship, driving, shopping, community activity, and occupation  PERSONAL FACTORS: Age, Fitness, Past/current experiences, Profession, Time since onset of injury/illness/exacerbation, and 1-2 comorbidities: MS anxiety are also affecting patient's functional outcome.   REHAB POTENTIAL: Good  CLINICAL DECISION MAKING: Evolving/moderate complexity  EVALUATION COMPLEXITY: Moderate  PLAN:  PT FREQUENCY: 2x/week  PT DURATION: 12 weeks  PLANNED INTERVENTIONS: 97164- PT Re-evaluation, 97110-Therapeutic exercises, 97530- Therapeutic activity, 97112- Neuromuscular re-education, 97535- Self Care, 02859- Manual therapy, [phone]- Gait training, [phone]- Orthotic Fit/training, [phone]- Canalith repositioning, Patient/Family education, Balance training, Stair training, Taping, Dry Needling, Joint mobilization, Joint manipulation, Spinal mobilization, Vestibular training, Visual/preceptual remediation/compensation, DME instructions, Cryotherapy, and Moist  heat  PLAN FOR NEXT SESSION:  airex pad/balance, LLE strengthening, HEP , LLE coordination. Adding cognitive/manual dual task with dynamic balance activities     Fonda Simpers, PT, DPT Physical Therapist - [facility]  10/31/23, 4:12 PM

## 2023-11-05 ENCOUNTER — Ambulatory Visit

## 2023-11-05 DIAGNOSIS — R2681 Unsteadiness on feet: Secondary | ICD-10-CM

## 2023-11-05 DIAGNOSIS — R262 Difficulty in walking, not elsewhere classified: Secondary | ICD-10-CM

## 2023-11-05 DIAGNOSIS — M6281 Muscle weakness (generalized): Secondary | ICD-10-CM

## 2023-11-05 DIAGNOSIS — R269 Unspecified abnormalities of gait and mobility: Secondary | ICD-10-CM

## 2023-11-05 DIAGNOSIS — M25621 Stiffness of right elbow, not elsewhere classified: Secondary | ICD-10-CM

## 2023-11-05 DIAGNOSIS — Z9181 History of falling: Secondary | ICD-10-CM

## 2023-11-05 DIAGNOSIS — M25521 Pain in right elbow: Secondary | ICD-10-CM

## 2023-11-05 NOTE — Therapy (Signed)
 OUTPATIENT PHYSICAL THERAPY NEURO TREATMENT  Patient Name: Michelle Ruiz MRN: [id number] DOB:04-22-1970, 53 y.o., female Today's Date: 11/06/2023   PCP: Sherial Bail  REFERRING PROVIDER: Maree Hila K  END OF SESSION:  PT End of Session - 11/05/23 1315     Visit Number 33    Number of Visits 51    Date for PT Re-Evaluation 12/31/23    Progress Note Due on Visit 70    PT Start Time 1316    PT Stop Time 1400    PT Time Calculation (min) 44 min    Equipment Utilized During Treatment Gait belt    Activity Tolerance Patient tolerated treatment well    Behavior During Therapy WFL for tasks assessed/performed            Past Medical History:  Diagnosis Date   Abnormal Pap smear of cervix    Actinic keratosis 09/17/2023   R spinal mid upper back   Anxiety    Basal cell carcinoma 04/14/2008   Right upper ant. thigh. BCC with sclerosis.   Basal cell carcinoma 08/14/2008   Right lat. lower thigh. Superficial.    Basal cell carcinoma 04/29/2019   Right mid forearm. Superficial and nodular patterns. EDC.   Basal cell carcinoma 08/17/2020   Right upper antecubital. EDC.   Basal cell carcinoma 08/23/2021   Left Lower Leg, EDC   Basal cell carcinoma 09/17/2023   L medial lower leg, EDC   Kidney stones    MS (multiple sclerosis) (HCC)    UTI (urinary tract infection)    Past Surgical History:  Procedure Laterality Date   BREAST BIOPSY Right 2010   stereotactic biopsy/ neg/ dr.byrnett   BREAST BIOPSY Left 2012   neg/dr. brynett   CESAREAN SECTION  2008   RPH   TONSILLECTOMY  2006   There are no active problems to display for this patient.   ONSET DATE: diagnosed 12 years ago.   REFERRING DIAG: MS, gait and mobility   THERAPY DIAG:  Difficulty in walking, not elsewhere classified  Abnormality of gait and mobility  Unsteadiness on feet  Muscle weakness (generalized)  History of falling  Pain in right elbow  Stiffness of right elbow, not elsewhere  classified  Rationale for Evaluation and Treatment: Rehabilitation  SUBJECTIVE:                                                                                                                                                                                             SUBJECTIVE STATEMENT:   Pt reports pain in the R shoulder is much better and noted it changed from a  sharp pain to a dull ache.  Pt otherwise doing well and had no falls.    Pt accompanied by: self  PERTINENT HISTORY: Patient was seen by physical therapy until September of last year. Pmh includes MS, UTI, anxiety. Has extreme spasticity in LLE. Extreme cold and heat affect her LLE. Patient is now taking hormonal medication. Patient is a Chartered certified accountant.   PAIN:  Are you having pain? No  PRECAUTIONS: None  RED FLAGS: None   WEIGHT BEARING RESTRICTIONS: No  FALLS: Has patient fallen in last 6 months? No  LIVING ENVIRONMENT: Lives with: lives with their family Lives in: House/apartment Stairs: no stairs to enter, lives on second floor Has following equipment at home: Single point cane and shower chair  PLOF: Independent  PATIENT GOALS: continue walking as long as she can.   OBJECTIVE:  Note: Objective measures were completed at Evaluation unless otherwise noted.  DIAGNOSTIC FINDINGS: n/a  COGNITION: Overall cognitive status: Within functional limits for tasks assessed   SENSATION: WFL; slight decrease to RLE   COORDINATION: Heel slide: limited LLE   MUSCLE TONE: LLE: Mild and Moderate  POSTURE: No Significant postural limitations  LOWER EXTREMITY ROM:     WFL  LOWER EXTREMITY MMT:    MMT Right Eval Left Eval  Hip flexion 6.4 4.2  Hip extension    Hip abduction 7.9 3.4  Hip adduction 7.5 8.5  Hip internal rotation    Hip external rotation    Knee flexion 11.1 10.2  Knee extension 7.8 10  Ankle dorsiflexion 10.8 6.8  Ankle plantarflexion 7.2 6.9  Ankle inversion    Ankle eversion     (Blank rows = not tested)  BED MOBILITY:  Scooting into bed is very challenging at end of night  TRANSFERS: Assistive device utilized: Single point cane  Sit to stand: Complete Independence Stand to sit: Complete Independence Chair to chair: Complete Independence  STAIRS: Level of Assistance: CGA Stair Negotiation Technique: Alternating Pattern  with Single Rail on Right Number of Stairs: 5  Height of Stairs: 6 inch   GAIT: Gait pattern: step through pattern and poor foot clearance- Left Distance walked: 60 ft Assistive device utilized: None Level of assistance: CGA Comments: L foot scuffing  FUNCTIONAL TESTS:  5 times sit to stand: 13 seconds with arms crossed.  6 minute walk test: next session  Functional gait assessment: 14/30  PATIENT SURVEYS:  ABC scale 60.5%                                                                                                                               TREATMENT DATE: 11/06/23   Gait belt donned with CGA for all standing activities, thorough therapeutic rest breaks provided throughout session   TherAct:  Patient ambulated 25 min indoor today - with no seated rest breaks - CGA for all indoor - no instances of instability when ambulating - Focusing on overall endurance and to improve foot  clearance with use of AW's donned.  4# AW donned for all ambulation  Seated:   LAQ with 4# AW donned bilaterally, 2x15 Marches with 4# AW donned bilaterally, 2x15  STS with no UE support, 2x10  Exercises utilized for hip and foot clearance necessary for curb navigation   Neuro:  Static stance on incline board, 60 sec bouts x2 Static stance on incline board, eyes closed, 60 sec bouts x2 Lateral weight shifts on wobble board for challenging LE proprioception and stability with moving surface, 60 sec bouts x2 Forward/Retro weight shifts on wobble board for challenging LE proprioception and stability with moving surface, 60 sec bouts x2 each LE  leading     PATIENT EDUCATION: Education details: Exercise technique; importance of daily stretching Person educated: Patient Education method: Explanation, Demonstration, Tactile cues, and Verbal cues Education comprehension: verbalized understanding, returned demonstration, verbal cues required, and tactile cues required   HOME EXERCISE PROGRAM: Access Code: [email or web address] URL: [email or web address] Date: 08/13/2023 Prepared by: Reyes London  Exercises - Hooklying Single Knee to Chest Stretch  - 1-2 x daily - 3 sets - 30 sec  hold - Supine Lower Trunk Rotation  - 1-2 x daily - 3 sets - 30 sec hold - Supine Hamstring Stretch with Strap  - 1-2 x daily - 3 sets - 30 sec hold - Seated Hamstring Stretch  - 1-2 x daily - 3 sets - 30 sec hold - Supine Hip Internal and External Rotation  - 1-2 x daily - 3 sets - 10 reps - Seated Hip External Rotation Stretch  - 1 x daily - 3 sets - 30 sec hold - Supine Figure 4 Piriformis Stretch  - 1 x daily - 3 sets - 30 sec hold   Access Code: [email or web address] URL: [email or web address] Date: 05/29/2023 Prepared by: Marina  Moser  Exercises - Towel Scrunches  - 1 x daily - 7 x weekly - 2 sets - 10 reps - 5 hold - Toe Spreading  - 1 x daily - 7 x weekly - 2 sets - 10 reps - 5 hold - Seated Ankle Alphabet  - 1 x daily - 7 x weekly - 2 sets - 1 reps - 5 hold  GOALS: Goals reviewed with patient? Yes  SHORT TERM GOALS: Target date: 05/22/2023  Patient will be independent in home exercise program to improve strength/mobility for better functional independence with ADLs. Baseline: 4/14: compliant  Goal status: MET    LONG TERM GOALS: Target date: 12/31/2023  Patient will increase Functional Gait Assessment score to >25/30 as to reduce fall risk and improve dynamic gait safety with community ambulation. Baseline: 1/28: 14/30   4/14: 17/30   6/11: 18/30  7/14: 20/30 Goal status: Partially Met  2.   Patient (< 65 years  old) will complete five times sit to stand test in < 10 seconds indicating an increased LE strength and improved balance. Baseline: 1/28: 13 seconds  4/14: 10.72 seconds no hands  6/11: 10 seconds no hands  Goal status: MET  3.  Patient will increase six minute walk test distance to >1000 for progression to community ambulator and improve gait ability Baseline: Perform next session 3/4: 820 ft  4/14: 890 ft with [facility]   6/11: 888 ft with [facility]  7/14: 870 ft with [facility]- only one instance of L foot catching on ground throughout test  Goal status: Partially Met   4.  Patient will return to nightly ambulation's with less than five foot scuffs per ambulation to return  to PLOF.  Baseline: 1/28: limited ambulation  4/14: some limitations pending on day. Encouraged to perform multiple short walks  6/11: started doing nightly walks ; decreased foot scuffs   7/14: pt reports no scuffs Goal status: MET  5. Patient will increase ABC scale score >80% to demonstrate better functional mobility and better confidence with ADLs.  Baseline: 1/28: 60%   4/14: 65%   6/11: 68%  7/14: 59.38% Goal status: Partially Met  6.  Pt will complete 20 minutes of low-impact cardio exercise (such as stationary cycling) 3 times per week for the next 6 weeks, gradually increasing the duration by 5 minutes every 2 weeks, to improve my cardiovascular endurance while monitoring for signs of fatigue or overheating. Baseline: 10/22/23: pt able to achieve 1 bout of 20 min in a week Goal Status: NEW   ASSESSMENT:  CLINICAL IMPRESSION:    Pt was able to progress to increased resistance when ambulating and improve overall tolerance to endurance training by ambulating further as well.  Pt being fairly consistent with HEP and was advised to continue in order to improve overall endurance levels at this time.  Pt also challenge with neuro based exercises as listed above to challenge balance and stability on uneven surfaces.   Pt will  continue to benefit from skilled therapy to address remaining deficits in order to improve overall QoL and return to PLOF.            OBJECTIVE IMPAIRMENTS: Abnormal gait, decreased activity tolerance, decreased balance, decreased coordination, decreased endurance, decreased mobility, difficulty walking, decreased strength, impaired perceived functional ability, impaired flexibility, impaired UE functional use, and improper body mechanics.   ACTIVITY LIMITATIONS: carrying, lifting, bending, sitting, standing, squatting, stairs, transfers, bed mobility, dressing, reach over head, hygiene/grooming, locomotion level, and caring for others  PARTICIPATION LIMITATIONS: meal prep, cleaning, laundry, interpersonal relationship, driving, shopping, community activity, and occupation  PERSONAL FACTORS: Age, Fitness, Past/current experiences, Profession, Time since onset of injury/illness/exacerbation, and 1-2 comorbidities: MS anxiety are also affecting patient's functional outcome.   REHAB POTENTIAL: Good  CLINICAL DECISION MAKING: Evolving/moderate complexity  EVALUATION COMPLEXITY: Moderate  PLAN:  PT FREQUENCY: 2x/week  PT DURATION: 12 weeks  PLANNED INTERVENTIONS: 97164- PT Re-evaluation, 97110-Therapeutic exercises, 97530- Therapeutic activity, 97112- Neuromuscular re-education, 97535- Self Care, [id number]- Manual therapy, [phone]- Gait training, [phone]- Orthotic Fit/training, [phone]- Canalith repositioning, Patient/Family education, Balance training, Stair training, Taping, Dry Needling, Joint mobilization, Joint manipulation, Spinal mobilization, Vestibular training, Visual/preceptual remediation/compensation, DME instructions, Cryotherapy, and Moist heat  PLAN FOR NEXT SESSION:   airex pad/balance, LLE strengthening, HEP , LLE coordination. Adding cognitive/manual dual task with dynamic balance activities     Fonda Simpers, PT, DPT Physical Therapist - [facility]  [facility]  11/06/23, 7:47 AM

## 2023-11-07 ENCOUNTER — Ambulatory Visit

## 2023-11-12 ENCOUNTER — Ambulatory Visit

## 2023-11-12 DIAGNOSIS — R2681 Unsteadiness on feet: Secondary | ICD-10-CM

## 2023-11-12 DIAGNOSIS — R262 Difficulty in walking, not elsewhere classified: Secondary | ICD-10-CM | POA: Diagnosis not present

## 2023-11-12 DIAGNOSIS — R269 Unspecified abnormalities of gait and mobility: Secondary | ICD-10-CM

## 2023-11-12 DIAGNOSIS — M6281 Muscle weakness (generalized): Secondary | ICD-10-CM

## 2023-11-12 DIAGNOSIS — Z9181 History of falling: Secondary | ICD-10-CM

## 2023-11-12 NOTE — Therapy (Signed)
 OUTPATIENT PHYSICAL THERAPY NEURO TREATMENT  Patient Name: Michelle Ruiz MRN: [id number] DOB:[birth year], 53 y.o., female Today's Date: 11/12/2023   PCP: Sherial Bail  REFERRING PROVIDER: Maree Hila K  END OF SESSION:  PT End of Session - 11/12/23 1325     Visit Number 34    Number of Visits 51    Date for PT Re-Evaluation 12/31/23    Progress Note Due on Visit 70    PT Start Time 1317    PT Stop Time 1400    PT Time Calculation (min) 43 min    Equipment Utilized During Treatment Gait belt    Activity Tolerance Patient tolerated treatment well    Behavior During Therapy WFL for tasks assessed/performed          Past Medical History:  Diagnosis Date   Abnormal Pap smear of cervix    Actinic keratosis 09/17/2023   R spinal mid upper back   Anxiety    Basal cell carcinoma 04/14/2008   Right upper ant. thigh. BCC with sclerosis.   Basal cell carcinoma 08/14/2008   Right lat. lower thigh. Superficial.    Basal cell carcinoma 04/29/2019   Right mid forearm. Superficial and nodular patterns. EDC.   Basal cell carcinoma 08/17/2020   Right upper antecubital. EDC.   Basal cell carcinoma 08/23/2021   Left Lower Leg, EDC   Basal cell carcinoma 09/17/2023   L medial lower leg, EDC   Kidney stones    MS (multiple sclerosis) (HCC)    UTI (urinary tract infection)    Past Surgical History:  Procedure Laterality Date   BREAST BIOPSY Right 2010   stereotactic biopsy/ neg/ dr.byrnett   BREAST BIOPSY Left 2012   neg/dr. brynett   CESAREAN SECTION  2008   RPH   TONSILLECTOMY  2006   There are no active problems to display for this patient.   ONSET DATE: diagnosed 12 years ago.   REFERRING DIAG: MS, gait and mobility   THERAPY DIAG:  Difficulty in walking, not elsewhere classified  Abnormality of gait and mobility  Unsteadiness on feet  Muscle weakness (generalized)  History of falling  Rationale for Evaluation and Treatment:  Rehabilitation  SUBJECTIVE:                                                                                                                                                                                             SUBJECTIVE STATEMENT:   Pt reports she has been to the eye MD this morning already.  Pt notes that she has been having increased clonus in the LE and her husband recommended her to  see her neurologist about it.    Pt accompanied by: self  PERTINENT HISTORY: Patient was seen by physical therapy until September of last year. Pmh includes MS, UTI, anxiety. Has extreme spasticity in LLE. Extreme cold and heat affect her LLE. Patient is now taking hormonal medication. Patient is a Chartered certified accountant.   PAIN:  Are you having pain? No  PRECAUTIONS: None  RED FLAGS: None   WEIGHT BEARING RESTRICTIONS: No  FALLS: Has patient fallen in last 6 months? No  LIVING ENVIRONMENT: Lives with: lives with their family Lives in: House/apartment Stairs: no stairs to enter, lives on second floor Has following equipment at home: Single point cane and shower chair  PLOF: Independent  PATIENT GOALS: continue walking as long as she can.   OBJECTIVE:  Note: Objective measures were completed at Evaluation unless otherwise noted.  DIAGNOSTIC FINDINGS: n/a  COGNITION: Overall cognitive status: Within functional limits for tasks assessed   SENSATION: WFL; slight decrease to RLE   COORDINATION: Heel slide: limited LLE   MUSCLE TONE: LLE: Mild and Moderate  POSTURE: No Significant postural limitations  LOWER EXTREMITY ROM:     WFL  LOWER EXTREMITY MMT:    MMT Right Eval Left Eval  Hip flexion 6.4 4.2  Hip extension    Hip abduction 7.9 3.4  Hip adduction 7.5 8.5  Hip internal rotation    Hip external rotation    Knee flexion 11.1 10.2  Knee extension 7.8 10  Ankle dorsiflexion 10.8 6.8  Ankle plantarflexion 7.2 6.9  Ankle inversion    Ankle eversion    (Blank rows  = not tested)  BED MOBILITY:  Scooting into bed is very challenging at end of night  TRANSFERS: Assistive device utilized: Single point cane  Sit to stand: Complete Independence Stand to sit: Complete Independence Chair to chair: Complete Independence  STAIRS: Level of Assistance: CGA Stair Negotiation Technique: Alternating Pattern  with Single Rail on Right Number of Stairs: 5  Height of Stairs: 6 inch   GAIT: Gait pattern: step through pattern and poor foot clearance- Left Distance walked: 60 ft Assistive device utilized: None Level of assistance: CGA Comments: L foot scuffing  FUNCTIONAL TESTS:  5 times sit to stand: 13 seconds with arms crossed.  6 minute walk test: next session  Functional gait assessment: 14/30  PATIENT SURVEYS:  ABC scale 60.5%                                                                                                                               TREATMENT DATE: 11/12/23    Gait belt donned with CGA for all standing activities, thorough therapeutic rest breaks provided throughout session   TherAct:  Patient ambulated 12 min indoor today - with no seated rest breaks - CGA for all indoor - no instances of instability when ambulating - Focusing on overall endurance and to improve foot clearance with use of AW's donned.  Pt with  flats donned and participated in retro walking for improved technique, however pt noted to have significant weakness in the ankles when wearing flats.  Pt demonstrated increased foot drop when ambulating with flats today and pt did not have any resistance added.    Self Care/Home Management:  Pt and therapist discussed shoe choices and pt wanting to attempt to purchase [employer] shoes that have a slide on the front to prevent pt's from scuffing the floor.  Pt and therapist discussed at length the rationale behind different footwear and the foot drop that the pt has when wearing flats.     Neuro:  The patient  participated in the KoreMaze game on the [place] platform for 20 minutes at stability level 3.0. This game challenges dynamic balance, weight shifting, and spatial navigation by requiring the patient to maneuver through a maze using controlled body movements on the balance board. The activity promotes postural control, motor planning, coordination, and visual-motor integration. The patient performed the task with  min assistance, and with moderate verbal cueing.      PATIENT EDUCATION: Education details: Exercise technique; importance of daily stretching Person educated: Patient Education method: Explanation, Demonstration, Tactile cues, and Verbal cues Education comprehension: verbalized understanding, returned demonstration, verbal cues required, and tactile cues required   HOME EXERCISE PROGRAM: Access Code: [email or web address] URL: [email or web address] Date: 08/13/2023 Prepared by: Reyes London  Exercises - Hooklying Single Knee to Chest Stretch  - 1-2 x daily - 3 sets - 30 sec  hold - Supine Lower Trunk Rotation  - 1-2 x daily - 3 sets - 30 sec hold - Supine Hamstring Stretch with Strap  - 1-2 x daily - 3 sets - 30 sec hold - Seated Hamstring Stretch  - 1-2 x daily - 3 sets - 30 sec hold - Supine Hip Internal and External Rotation  - 1-2 x daily - 3 sets - 10 reps - Seated Hip External Rotation Stretch  - 1 x daily - 3 sets - 30 sec hold - Supine Figure 4 Piriformis Stretch  - 1 x daily - 3 sets - 30 sec hold   Access Code: [email or web address] URL: [email or web address] Date: 05/29/2023 Prepared by: Marina  Moser  Exercises - Towel Scrunches  - 1 x daily - 7 x weekly - 2 sets - 10 reps - 5 hold - Toe Spreading  - 1 x daily - 7 x weekly - 2 sets - 10 reps - 5 hold - Seated Ankle Alphabet  - 1 x daily - 7 x weekly - 2 sets - 1 reps - 5 hold  GOALS: Goals reviewed with patient? Yes  SHORT TERM GOALS: Target date: 05/22/2023  Patient will be independent in  home exercise program to improve strength/mobility for better functional independence with ADLs. Baseline: 4/14: compliant  Goal status: MET    LONG TERM GOALS: Target date: 12/31/2023  Patient will increase Functional Gait Assessment score to >25/30 as to reduce fall risk and improve dynamic gait safety with community ambulation. Baseline: 1/28: 14/30   4/14: 17/30   6/11: 18/30  7/14: 20/30 Goal status: Partially Met  2.   Patient (< 67 years old) will complete five times sit to stand test in < 10 seconds indicating an increased LE strength and improved balance. Baseline: 1/28: 13 seconds  4/14: 10.72 seconds no hands  6/11: 10 seconds no hands  Goal status: MET  3.  Patient will increase six minute walk test distance to >1000 for progression to community ambulator and improve gait  ability Baseline: Perform next session 3/4: 820 ft  4/14: 890 ft with [facility]   6/11: 888 ft with [facility]  7/14: 870 ft with [facility]- only one instance of L foot catching on ground throughout test  Goal status: Partially Met   4.  Patient will return to nightly ambulation's with less than five foot scuffs per ambulation to return to PLOF.  Baseline: 1/28: limited ambulation  4/14: some limitations pending on day. Encouraged to perform multiple short walks  6/11: started doing nightly walks ; decreased foot scuffs   7/14: pt reports no scuffs Goal status: MET  5. Patient will increase ABC scale score >80% to demonstrate better functional mobility and better confidence with ADLs.  Baseline: 1/28: 60%   4/14: 65%   6/11: 68%  7/14: 59.38% Goal status: Partially Met  6.  Pt will complete 20 minutes of low-impact cardio exercise (such as stationary cycling) 3 times per week for the next 6 weeks, gradually increasing the duration by 5 minutes every 2 weeks, to improve my cardiovascular endurance while monitoring for signs of fatigue or overheating. Baseline: 10/22/23: pt able to achieve 1 bout of 20 min in a  week Goal Status: NEW   ASSESSMENT:  CLINICAL IMPRESSION:    Pt's gait mechanics were reduced this session and likely to be attributed to wearing flats.  Pt prefers these over sneakers due to the fact she does not scuff her feet as much when wearing flats.  Pt has increased foot drop when wearing flats however.  Pt encouraged to perform exercises at home in order to improve overall gait mechanics.  Pt encouraged to continue cycling as well.  Pt will continue to benefit from skilled therapy to address remaining deficits in order to improve overall QoL and return to PLOF.             OBJECTIVE IMPAIRMENTS: Abnormal gait, decreased activity tolerance, decreased balance, decreased coordination, decreased endurance, decreased mobility, difficulty walking, decreased strength, impaired perceived functional ability, impaired flexibility, impaired UE functional use, and improper body mechanics.   ACTIVITY LIMITATIONS: carrying, lifting, bending, sitting, standing, squatting, stairs, transfers, bed mobility, dressing, reach over head, hygiene/grooming, locomotion level, and caring for others  PARTICIPATION LIMITATIONS: meal prep, cleaning, laundry, interpersonal relationship, driving, shopping, community activity, and occupation  PERSONAL FACTORS: Age, Fitness, Past/current experiences, Profession, Time since onset of injury/illness/exacerbation, and 1-2 comorbidities: MS anxiety are also affecting patient's functional outcome.   REHAB POTENTIAL: Good  CLINICAL DECISION MAKING: Evolving/moderate complexity  EVALUATION COMPLEXITY: Moderate  PLAN:  PT FREQUENCY: 2x/week  PT DURATION: 12 weeks  PLANNED INTERVENTIONS: 97164- PT Re-evaluation, 97110-Therapeutic exercises, 97530- Therapeutic activity, 97112- Neuromuscular re-education, 97535- Self Care, 02859- Manual therapy, [phone]- Gait training, [phone]- Orthotic Fit/training, [phone]- Canalith repositioning, Patient/Family education, Balance  training, Stair training, Taping, Dry Needling, Joint mobilization, Joint manipulation, Spinal mobilization, Vestibular training, Visual/preceptual remediation/compensation, DME instructions, Cryotherapy, and Moist heat  PLAN FOR NEXT SESSION:     airex pad/balance, LLE strengthening, HEP , LLE coordination. Adding cognitive/manual dual task with dynamic balance activities     Fonda Simpers, PT, DPT Physical Therapist - [facility]  11/12/23, 2:37 PM

## 2023-11-13 ENCOUNTER — Ambulatory Visit
Admission: RE | Admit: 2023-11-13 | Discharge: 2023-11-13 | Disposition: A | Discharge status: Home / Self Care | Source: Ambulatory Visit | Attending: Internal Medicine | Admitting: Internal Medicine

## 2023-11-13 DIAGNOSIS — Z1231 Encounter for screening mammogram for malignant neoplasm of breast: Secondary | ICD-10-CM | POA: Insufficient documentation

## 2023-11-14 ENCOUNTER — Ambulatory Visit

## 2023-11-14 DIAGNOSIS — R262 Difficulty in walking, not elsewhere classified: Secondary | ICD-10-CM

## 2023-11-14 DIAGNOSIS — R269 Unspecified abnormalities of gait and mobility: Secondary | ICD-10-CM

## 2023-11-14 DIAGNOSIS — Z9181 History of falling: Secondary | ICD-10-CM

## 2023-11-14 DIAGNOSIS — M6281 Muscle weakness (generalized): Secondary | ICD-10-CM

## 2023-11-14 DIAGNOSIS — R2681 Unsteadiness on feet: Secondary | ICD-10-CM

## 2023-11-14 NOTE — Therapy (Signed)
 OUTPATIENT PHYSICAL THERAPY NEURO TREATMENT  Patient Name: Michelle Ruiz MRN: [id number] DOB:Jul 13, 1970, 53 y.o., female Today's Date: 11/14/2023   PCP: Sherial Bail  REFERRING PROVIDER: Maree Hila K  END OF SESSION:  PT End of Session - 11/14/23 1321     Visit Number 35    Number of Visits 51    Date for PT Re-Evaluation 12/31/23    Progress Note Due on Visit 70    PT Start Time 1318    PT Stop Time 1400    PT Time Calculation (min) 42 min    Equipment Utilized During Treatment Gait belt    Activity Tolerance Patient tolerated treatment well    Behavior During Therapy WFL for tasks assessed/performed          Past Medical History:  Diagnosis Date   Abnormal Pap smear of cervix    Actinic keratosis 09/17/2023   R spinal mid upper back   Anxiety    Basal cell carcinoma 04/14/2008   Right upper ant. thigh. BCC with sclerosis.   Basal cell carcinoma 08/14/2008   Right lat. lower thigh. Superficial.    Basal cell carcinoma 04/29/2019   Right mid forearm. Superficial and nodular patterns. EDC.   Basal cell carcinoma 08/17/2020   Right upper antecubital. EDC.   Basal cell carcinoma 08/23/2021   Left Lower Leg, EDC   Basal cell carcinoma 09/17/2023   L medial lower leg, EDC   Kidney stones    MS (multiple sclerosis) (HCC)    UTI (urinary tract infection)    Past Surgical History:  Procedure Laterality Date   BREAST BIOPSY Right 2010   stereotactic biopsy/ neg/ dr.byrnett   BREAST BIOPSY Left 2012   neg/dr. brynett   CESAREAN SECTION  2008   RPH   TONSILLECTOMY  2006   There are no active problems to display for this patient.   ONSET DATE: diagnosed 12 years ago.   REFERRING DIAG: MS, gait and mobility   THERAPY DIAG:  Difficulty in walking, not elsewhere classified  Abnormality of gait and mobility  Unsteadiness on feet  Muscle weakness (generalized)  History of falling  Rationale for Evaluation and Treatment:  Rehabilitation  SUBJECTIVE:                                                                                                                                                                                             SUBJECTIVE STATEMENT:  Pt reports she is having increased difficulty with walking today and would prefer to not do a lot of increased walking, as her symptoms are aggravated more today.  Pt accompanied by: self  PERTINENT HISTORY: Patient was seen by physical therapy until September of last year. Pmh includes MS, UTI, anxiety. Has extreme spasticity in LLE. Extreme cold and heat affect her LLE. Patient is now taking hormonal medication. Patient is a Chartered certified accountant.   PAIN:  Are you having pain? No  PRECAUTIONS: None  RED FLAGS: None   WEIGHT BEARING RESTRICTIONS: No  FALLS: Has patient fallen in last 6 months? No  LIVING ENVIRONMENT: Lives with: lives with their family Lives in: House/apartment Stairs: no stairs to enter, lives on second floor Has following equipment at home: Single point cane and shower chair  PLOF: Independent  PATIENT GOALS: continue walking as long as she can.   OBJECTIVE:  Note: Objective measures were completed at Evaluation unless otherwise noted.  DIAGNOSTIC FINDINGS: n/a  COGNITION: Overall cognitive status: Within functional limits for tasks assessed   SENSATION: WFL; slight decrease to RLE   COORDINATION: Heel slide: limited LLE   MUSCLE TONE: LLE: Mild and Moderate  POSTURE: No Significant postural limitations  LOWER EXTREMITY ROM:     WFL  LOWER EXTREMITY MMT:    MMT Right Eval Left Eval  Hip flexion 6.4 4.2  Hip extension    Hip abduction 7.9 3.4  Hip adduction 7.5 8.5  Hip internal rotation    Hip external rotation    Knee flexion 11.1 10.2  Knee extension 7.8 10  Ankle dorsiflexion 10.8 6.8  Ankle plantarflexion 7.2 6.9  Ankle inversion    Ankle eversion    (Blank rows = not tested)  BED  MOBILITY:  Scooting into bed is very challenging at end of night  TRANSFERS: Assistive device utilized: Single point cane  Sit to stand: Complete Independence Stand to sit: Complete Independence Chair to chair: Complete Independence  STAIRS: Level of Assistance: CGA Stair Negotiation Technique: Alternating Pattern  with Single Rail on Right Number of Stairs: 5  Height of Stairs: 6 inch   GAIT: Gait pattern: step through pattern and poor foot clearance- Left Distance walked: 60 ft Assistive device utilized: None Level of assistance: CGA Comments: L foot scuffing  FUNCTIONAL TESTS:  5 times sit to stand: 13 seconds with arms crossed.  6 minute walk test: next session  Functional gait assessment: 14/30  PATIENT SURVEYS:  ABC scale 60.5%                                                                                                                               TREATMENT DATE: 11/14/23   Gait belt donned with CGA for all standing activities, thorough therapeutic rest breaks provided throughout session   TherAct:  Ambulation around the gym and down by the cafeteria, down EVS hallway, and back to gym with 2 near LOB and pt requiring minA to prevent uncontrolled descent to the floor.    TherEx:  Seated hip abduction with GTB, 4x10 each LE Seated LAQ, 5# AW donned, 4x10 each  LE Seated resisted marching, 5# AW donned, 4x10 each LE Seated ankle dorsiflexion/plantarflexion, 4x10 STS 2x10   Self-Care/Home Management:  Pt and therapist had educational discussion on purpose of therapy and the symptom response that the pt feels on a given day.  All pt questions about current prognosis and mobility concerns answered at this time.     PATIENT EDUCATION: Education details: Exercise technique; importance of daily stretching Person educated: Patient Education method: Explanation, Demonstration, Tactile cues, and Verbal cues Education comprehension: verbalized understanding,  returned demonstration, verbal cues required, and tactile cues required   HOME EXERCISE PROGRAM: Access Code: [email or web address] URL: [email or web address] Date: 08/13/2023 Prepared by: Reyes London  Exercises - Hooklying Single Knee to Chest Stretch  - 1-2 x daily - 3 sets - 30 sec  hold - Supine Lower Trunk Rotation  - 1-2 x daily - 3 sets - 30 sec hold - Supine Hamstring Stretch with Strap  - 1-2 x daily - 3 sets - 30 sec hold - Seated Hamstring Stretch  - 1-2 x daily - 3 sets - 30 sec hold - Supine Hip Internal and External Rotation  - 1-2 x daily - 3 sets - 10 reps - Seated Hip External Rotation Stretch  - 1 x daily - 3 sets - 30 sec hold - Supine Figure 4 Piriformis Stretch  - 1 x daily - 3 sets - 30 sec hold   Access Code: [email or web address] URL: [email or web address] Date: 05/29/2023 Prepared by: Marina  Moser  Exercises - Towel Scrunches  - 1 x daily - 7 x weekly - 2 sets - 10 reps - 5 hold - Toe Spreading  - 1 x daily - 7 x weekly - 2 sets - 10 reps - 5 hold - Seated Ankle Alphabet  - 1 x daily - 7 x weekly - 2 sets - 1 reps - 5 hold  GOALS: Goals reviewed with patient? Yes  SHORT TERM GOALS: Target date: 05/22/2023  Patient will be independent in home exercise program to improve strength/mobility for better functional independence with ADLs. Baseline: 4/14: compliant  Goal status: MET    LONG TERM GOALS: Target date: 12/31/2023  Patient will increase Functional Gait Assessment score to >25/30 as to reduce fall risk and improve dynamic gait safety with community ambulation. Baseline: 1/28: 14/30   4/14: 17/30   6/11: 18/30  7/14: 20/30 Goal status: Partially Met  2.   Patient (< 73 years old) will complete five times sit to stand test in < 10 seconds indicating an increased LE strength and improved balance. Baseline: 1/28: 13 seconds  4/14: 10.72 seconds no hands  6/11: 10 seconds no hands  Goal status: MET  3.  Patient will increase six  minute walk test distance to >1000 for progression to community ambulator and improve gait ability Baseline: Perform next session 3/4: 820 ft  4/14: 890 ft with [facility]   6/11: 888 ft with [facility]  7/14: 870 ft with SPC- only one instance of L foot catching on ground throughout test  Goal status: Partially Met   4.  Patient will return to nightly ambulation's with less than five foot scuffs per ambulation to return to PLOF.  Baseline: 1/28: limited ambulation  4/14: some limitations pending on day. Encouraged to perform multiple short walks  6/11: started doing nightly walks ; decreased foot scuffs   7/14: pt reports no scuffs Goal status: MET  5. Patient will increase ABC scale score >80% to demonstrate better functional mobility and better confidence with  ADLs.  Baseline: 1/28: 60%   4/14: 65%   6/11: 68%  7/14: 59.38% Goal status: Partially Met  6.  Pt will complete 20 minutes of low-impact cardio exercise (such as stationary cycling) 3 times per week for the next 6 weeks, gradually increasing the duration by 5 minutes every 2 weeks, to improve my cardiovascular endurance while monitoring for signs of fatigue or overheating. Baseline: 10/22/23: pt able to achieve 1 bout of 20 min in a week Goal Status: NEW   ASSESSMENT:  CLINICAL IMPRESSION:    Pt with reduced ability to perform mobility today and concerns were addressed throughout the session.  Pt felt as though she is having an issue lifting her feet, and so that was address during treatment sessions.  Pt limited during the session overall, however pt continues to put forth good effort throughout the sessions.   Pt will continue to benefit from skilled therapy to address remaining deficits in order to improve overall QoL and return to PLOF.              OBJECTIVE IMPAIRMENTS: Abnormal gait, decreased activity tolerance, decreased balance, decreased coordination, decreased endurance, decreased mobility, difficulty walking,  decreased strength, impaired perceived functional ability, impaired flexibility, impaired UE functional use, and improper body mechanics.   ACTIVITY LIMITATIONS: carrying, lifting, bending, sitting, standing, squatting, stairs, transfers, bed mobility, dressing, reach over head, hygiene/grooming, locomotion level, and caring for others  PARTICIPATION LIMITATIONS: meal prep, cleaning, laundry, interpersonal relationship, driving, shopping, community activity, and occupation  PERSONAL FACTORS: Age, Fitness, Past/current experiences, Profession, Time since onset of injury/illness/exacerbation, and 1-2 comorbidities: MS anxiety are also affecting patient's functional outcome.   REHAB POTENTIAL: Good  CLINICAL DECISION MAKING: Evolving/moderate complexity  EVALUATION COMPLEXITY: Moderate  PLAN:  PT FREQUENCY: 2x/week  PT DURATION: 12 weeks  PLANNED INTERVENTIONS: 97164- PT Re-evaluation, 97110-Therapeutic exercises, 97530- Therapeutic activity, 97112- Neuromuscular re-education, 97535- Self Care, [id number]- Manual therapy, [phone]- Gait training, [phone]- Orthotic Fit/training, [phone]- Canalith repositioning, Patient/Family education, Balance training, Stair training, Taping, Dry Needling, Joint mobilization, Joint manipulation, Spinal mobilization, Vestibular training, Visual/preceptual remediation/compensation, DME instructions, Cryotherapy, and Moist heat  PLAN FOR NEXT SESSION:    airex pad/balance, LLE strengthening, HEP , LLE coordination. Adding cognitive/manual dual task with dynamic balance activities     Fonda Simpers, PT, DPT Physical Therapist - [facility]  11/14/23, 3:48 PM

## 2023-11-19 ENCOUNTER — Ambulatory Visit

## 2023-11-19 DIAGNOSIS — R262 Difficulty in walking, not elsewhere classified: Secondary | ICD-10-CM | POA: Diagnosis not present

## 2023-11-19 DIAGNOSIS — M6281 Muscle weakness (generalized): Secondary | ICD-10-CM

## 2023-11-19 DIAGNOSIS — R269 Unspecified abnormalities of gait and mobility: Secondary | ICD-10-CM

## 2023-11-19 DIAGNOSIS — R2681 Unsteadiness on feet: Secondary | ICD-10-CM

## 2023-11-19 DIAGNOSIS — Z9181 History of falling: Secondary | ICD-10-CM

## 2023-11-19 DIAGNOSIS — M25521 Pain in right elbow: Secondary | ICD-10-CM

## 2023-11-19 DIAGNOSIS — M25621 Stiffness of right elbow, not elsewhere classified: Secondary | ICD-10-CM

## 2023-11-19 NOTE — Therapy (Signed)
 OUTPATIENT PHYSICAL THERAPY NEURO TREATMENT  Patient Name: Michelle Ruiz MRN: [id number] DOB:[birth year], 53 y.o., female Today's Date: 11/19/2023   PCP: Sherial Bail  REFERRING PROVIDER: Maree Hila K  END OF SESSION:  PT End of Session - 11/19/23 1315     Visit Number 36    Number of Visits 51    Date for PT Re-Evaluation 12/31/23    Progress Note Due on Visit 70    PT Start Time 1315    PT Stop Time 1400    PT Time Calculation (min) 45 min    Equipment Utilized During Treatment Gait belt    Activity Tolerance Patient tolerated treatment well    Behavior During Therapy WFL for tasks assessed/performed           Past Medical History:  Diagnosis Date   Abnormal Pap smear of cervix    Actinic keratosis 09/17/2023   R spinal mid upper back   Anxiety    Basal cell carcinoma 04/14/2008   Right upper ant. thigh. BCC with sclerosis.   Basal cell carcinoma 08/14/2008   Right lat. lower thigh. Superficial.    Basal cell carcinoma 04/29/2019   Right mid forearm. Superficial and nodular patterns. EDC.   Basal cell carcinoma 08/17/2020   Right upper antecubital. EDC.   Basal cell carcinoma 08/23/2021   Left Lower Leg, EDC   Basal cell carcinoma 09/17/2023   L medial lower leg, EDC   Kidney stones    MS (multiple sclerosis) (HCC)    UTI (urinary tract infection)    Past Surgical History:  Procedure Laterality Date   BREAST BIOPSY Right 2010   stereotactic biopsy/ neg/ dr.byrnett   BREAST BIOPSY Left 2012   neg/dr. brynett   CESAREAN SECTION  2008   RPH   TONSILLECTOMY  2006   There are no active problems to display for this patient.   ONSET DATE: diagnosed 12 years ago.   REFERRING DIAG: MS, gait and mobility   THERAPY DIAG:  Difficulty in walking, not elsewhere classified  Abnormality of gait and mobility  Unsteadiness on feet  Muscle weakness (generalized)  History of falling  Pain in right elbow  Stiffness of right elbow, not elsewhere  classified  Rationale for Evaluation and Treatment: Rehabilitation  SUBJECTIVE:                                                                                                                                                                                             SUBJECTIVE STATEMENT:  Pt reports she got the new puppy on Friday.  Pt notes that she feels humbled by the  fact that she is not able to traverse over uneven ground, or bend over to pick up toys/grass out of the dogs mouth.     Pt accompanied by: self  PERTINENT HISTORY: Patient was seen by physical therapy until September of last year. Pmh includes MS, UTI, anxiety. Has extreme spasticity in LLE. Extreme cold and heat affect her LLE. Patient is now taking hormonal medication. Patient is a Chartered certified accountant.   PAIN:  Are you having pain? No  PRECAUTIONS: None  RED FLAGS: None   WEIGHT BEARING RESTRICTIONS: No  FALLS: Has patient fallen in last 6 months? No  LIVING ENVIRONMENT: Lives with: lives with their family Lives in: House/apartment Stairs: no stairs to enter, lives on second floor Has following equipment at home: Single point cane and shower chair  PLOF: Independent  PATIENT GOALS: continue walking as long as she can.   OBJECTIVE:  Note: Objective measures were completed at Evaluation unless otherwise noted.  DIAGNOSTIC FINDINGS: n/a  COGNITION: Overall cognitive status: Within functional limits for tasks assessed   SENSATION: WFL; slight decrease to RLE   COORDINATION: Heel slide: limited LLE   MUSCLE TONE: LLE: Mild and Moderate  POSTURE: No Significant postural limitations  LOWER EXTREMITY ROM:     WFL  LOWER EXTREMITY MMT:    MMT Right Eval Left Eval  Hip flexion 6.4 4.2  Hip extension    Hip abduction 7.9 3.4  Hip adduction 7.5 8.5  Hip internal rotation    Hip external rotation    Knee flexion 11.1 10.2  Knee extension 7.8 10  Ankle dorsiflexion 10.8 6.8  Ankle  plantarflexion 7.2 6.9  Ankle inversion    Ankle eversion    (Blank rows = not tested)  BED MOBILITY:  Scooting into bed is very challenging at end of night  TRANSFERS: Assistive device utilized: Single point cane  Sit to stand: Complete Independence Stand to sit: Complete Independence Chair to chair: Complete Independence  STAIRS: Level of Assistance: CGA Stair Negotiation Technique: Alternating Pattern  with Single Rail on Right Number of Stairs: 5  Height of Stairs: 6 inch   GAIT: Gait pattern: step through pattern and poor foot clearance- Left Distance walked: 60 ft Assistive device utilized: None Level of assistance: CGA Comments: L foot scuffing  FUNCTIONAL TESTS:  5 times sit to stand: 13 seconds with arms crossed.  6 minute walk test: next session  Functional gait assessment: 14/30  PATIENT SURVEYS:  ABC scale 60.5%                                                                                                                               TREATMENT DATE: 11/19/23   Gait belt donned with CGA for all standing activities, thorough therapeutic rest breaks provided throughout session    TherAct:  Patient ambulated 35 min indoor/outdoor today- including tile, concrete, horizontal head turns, on varied surfaces: concrete, brick, asphalt, mulch, grass, up ramp, inclines,  declines- with 0 seated rest break(s) - CGA for all indoor/outdoor - 0 instances of instability when ambulating over mulch - Focusing on overall endurance and to improve ambulation across uneven terrain that pt would come in contact with in the community.  Ambulation around the gym with picking up bucket of small basketballs from the floor to simulate taking care of her new puppy, x2 total buckets   PATIENT EDUCATION: Education details: Exercise technique; importance of daily stretching Person educated: Patient Education method: Explanation, Demonstration, Tactile cues, and Verbal cues Education  comprehension: verbalized understanding, returned demonstration, verbal cues required, and tactile cues required   HOME EXERCISE PROGRAM: Access Code: [email or web address] URL: [email or web address] Date: 08/13/2023 Prepared by: Reyes London  Exercises - Hooklying Single Knee to Chest Stretch  - 1-2 x daily - 3 sets - 30 sec  hold - Supine Lower Trunk Rotation  - 1-2 x daily - 3 sets - 30 sec hold - Supine Hamstring Stretch with Strap  - 1-2 x daily - 3 sets - 30 sec hold - Seated Hamstring Stretch  - 1-2 x daily - 3 sets - 30 sec hold - Supine Hip Internal and External Rotation  - 1-2 x daily - 3 sets - 10 reps - Seated Hip External Rotation Stretch  - 1 x daily - 3 sets - 30 sec hold - Supine Figure 4 Piriformis Stretch  - 1 x daily - 3 sets - 30 sec hold   Access Code: [email or web address] URL: [email or web address] Date: 05/29/2023 Prepared by: Marina  Moser  Exercises - Towel Scrunches  - 1 x daily - 7 x weekly - 2 sets - 10 reps - 5 hold - Toe Spreading  - 1 x daily - 7 x weekly - 2 sets - 10 reps - 5 hold - Seated Ankle Alphabet  - 1 x daily - 7 x weekly - 2 sets - 1 reps - 5 hold  GOALS: Goals reviewed with patient? Yes  SHORT TERM GOALS: Target date: 05/22/2023  Patient will be independent in home exercise program to improve strength/mobility for better functional independence with ADLs. Baseline: 4/14: compliant  Goal status: MET    LONG TERM GOALS: Target date: 12/31/2023  Patient will increase Functional Gait Assessment score to >25/30 as to reduce fall risk and improve dynamic gait safety with community ambulation. Baseline: 1/28: 14/30   4/14: 17/30   6/11: 18/30  7/14: 20/30 Goal status: Partially Met  2.   Patient (< 43 years old) will complete five times sit to stand test in < 10 seconds indicating an increased LE strength and improved balance. Baseline: 1/28: 13 seconds  4/14: 10.72 seconds no hands  6/11: 10 seconds no hands  Goal status:  MET  3.  Patient will increase six minute walk test distance to >1000 for progression to community ambulator and improve gait ability Baseline: Perform next session 3/4: 820 ft  4/14: 890 ft with [facility]   6/11: 888 ft with [facility]  7/14: 870 ft with [facility]- only one instance of L foot catching on ground throughout test  Goal status: Partially Met   4.  Patient will return to nightly ambulation's with less than five foot scuffs per ambulation to return to PLOF.  Baseline: 1/28: limited ambulation  4/14: some limitations pending on day. Encouraged to perform multiple short walks  6/11: started doing nightly walks ; decreased foot scuffs   7/14: pt reports no scuffs Goal status: MET  5. Patient will increase ABC scale score >80%  to demonstrate better functional mobility and better confidence with ADLs.  Baseline: 1/28: 60%   4/14: 65%   6/11: 68%  7/14: 59.38% Goal status: Partially Met  6.  Pt will complete 30 minutes of low-impact cardio exercise (such as stationary cycling) 3 times per week for the next 6 weeks, gradually increasing the duration by 5 minutes every 2 weeks, to improve my cardiovascular endurance while monitoring for signs of fatigue or overheating. Baseline: 10/22/23: pt able to achieve 1 bout of 20 min in a week Goal Status: NEW   ASSESSMENT:  CLINICAL IMPRESSION:    Pt responded well to the exercises and was able to traverse the overground walking without any complications.  Pt noted to be somewhat fatigued at the end of the session.  Time spent outdoors was somewhat limited due to heat and MS diagnosis, however pt performed well throughout the session.  Pt also winded with bending over to pick up basketballs, but was able to perform.  Pt encouraged to continue to perform HEP and biking to build up endurance levels.  Pt will continue to benefit from skilled therapy to address remaining deficits in order to improve overall QoL and return to PLOF.          OBJECTIVE  IMPAIRMENTS: Abnormal gait, decreased activity tolerance, decreased balance, decreased coordination, decreased endurance, decreased mobility, difficulty walking, decreased strength, impaired perceived functional ability, impaired flexibility, impaired UE functional use, and improper body mechanics.   ACTIVITY LIMITATIONS: carrying, lifting, bending, sitting, standing, squatting, stairs, transfers, bed mobility, dressing, reach over head, hygiene/grooming, locomotion level, and caring for others  PARTICIPATION LIMITATIONS: meal prep, cleaning, laundry, interpersonal relationship, driving, shopping, community activity, and occupation  PERSONAL FACTORS: Age, Fitness, Past/current experiences, Profession, Time since onset of injury/illness/exacerbation, and 1-2 comorbidities: MS anxiety are also affecting patient's functional outcome.   REHAB POTENTIAL: Good  CLINICAL DECISION MAKING: Evolving/moderate complexity  EVALUATION COMPLEXITY: Moderate  PLAN:  PT FREQUENCY: 2x/week  PT DURATION: 12 weeks  PLANNED INTERVENTIONS: 97164- PT Re-evaluation, 97110-Therapeutic exercises, 97530- Therapeutic activity, 97112- Neuromuscular re-education, 97535- Self Care, 02859- Manual therapy, [phone]- Gait training, [phone]- Orthotic Fit/training, [phone]- Canalith repositioning, Patient/Family education, Balance training, Stair training, Taping, Dry Needling, Joint mobilization, Joint manipulation, Spinal mobilization, Vestibular training, Visual/preceptual remediation/compensation, DME instructions, Cryotherapy, and Moist heat  PLAN FOR NEXT SESSION:    airex pad/balance, LLE strengthening, HEP , LLE coordination. Adding cognitive/manual dual task with dynamic balance activities     Fonda Simpers, PT, DPT Physical Therapist - [facility]  11/19/23, 2:03 PM

## 2023-11-21 ENCOUNTER — Ambulatory Visit

## 2023-11-21 DIAGNOSIS — R262 Difficulty in walking, not elsewhere classified: Secondary | ICD-10-CM | POA: Diagnosis not present

## 2023-11-21 DIAGNOSIS — R269 Unspecified abnormalities of gait and mobility: Secondary | ICD-10-CM

## 2023-11-21 DIAGNOSIS — Z9181 History of falling: Secondary | ICD-10-CM

## 2023-11-21 DIAGNOSIS — R2681 Unsteadiness on feet: Secondary | ICD-10-CM

## 2023-11-21 DIAGNOSIS — M6281 Muscle weakness (generalized): Secondary | ICD-10-CM

## 2023-11-21 DIAGNOSIS — M25621 Stiffness of right elbow, not elsewhere classified: Secondary | ICD-10-CM

## 2023-11-21 NOTE — Therapy (Signed)
 OUTPATIENT PHYSICAL THERAPY NEURO TREATMENT  Patient Name: Michelle Ruiz MRN: [id number] DOB:November 01, 1970, 53 y.o., female Today's Date: 11/21/2023   PCP: Sherial Bail  REFERRING PROVIDER: Maree Hila K  END OF SESSION:  PT End of Session - 11/21/23 1314     Visit Number 37    Number of Visits 51    Date for PT Re-Evaluation 12/31/23    Progress Note Due on Visit 70    PT Start Time 1315    PT Stop Time 1400    PT Time Calculation (min) 45 min    Equipment Utilized During Treatment Gait belt    Activity Tolerance Patient tolerated treatment well    Behavior During Therapy WFL for tasks assessed/performed           Past Medical History:  Diagnosis Date   Abnormal Pap smear of cervix    Actinic keratosis 09/17/2023   R spinal mid upper back   Anxiety    Basal cell carcinoma 04/14/2008   Right upper ant. thigh. BCC with sclerosis.   Basal cell carcinoma 08/14/2008   Right lat. lower thigh. Superficial.    Basal cell carcinoma 04/29/2019   Right mid forearm. Superficial and nodular patterns. EDC.   Basal cell carcinoma 08/17/2020   Right upper antecubital. EDC.   Basal cell carcinoma 08/23/2021   Left Lower Leg, EDC   Basal cell carcinoma 09/17/2023   L medial lower leg, EDC   Kidney stones    MS (multiple sclerosis) (HCC)    UTI (urinary tract infection)    Past Surgical History:  Procedure Laterality Date   BREAST BIOPSY Right 2010   stereotactic biopsy/ neg/ dr.byrnett   BREAST BIOPSY Left 2012   neg/dr. brynett   CESAREAN SECTION  2008   RPH   TONSILLECTOMY  2006   There are no active problems to display for this patient.   ONSET DATE: diagnosed 12 years ago.   REFERRING DIAG: MS, gait and mobility   THERAPY DIAG:  Difficulty in walking, not elsewhere classified  Abnormality of gait and mobility  Unsteadiness on feet  Muscle weakness (generalized)  History of falling  Stiffness of right elbow, not elsewhere classified  Rationale  for Evaluation and Treatment: Rehabilitation  SUBJECTIVE:                                                                                                                                                                                             SUBJECTIVE STATEMENT:  Pt reports that her pup is pretty much potty trained and is doing well.  Pt reports that she attempted to bike last  night and noted one episode of clonus when she hit her foot on the bike the first time.  Pt noted she was able to do 25 minutes begrudgingly.     Pt accompanied by: self  PERTINENT HISTORY: Patient was seen by physical therapy until September of last year. Pmh includes MS, UTI, anxiety. Has extreme spasticity in LLE. Extreme cold and heat affect her LLE. Patient is now taking hormonal medication. Patient is a Chartered certified accountant.   PAIN:  Are you having pain? No  PRECAUTIONS: None  RED FLAGS: None   WEIGHT BEARING RESTRICTIONS: No  FALLS: Has patient fallen in last 6 months? No  LIVING ENVIRONMENT: Lives with: lives with their family Lives in: House/apartment Stairs: no stairs to enter, lives on second floor Has following equipment at home: Single point cane and shower chair  PLOF: Independent  PATIENT GOALS: continue walking as long as she can.   OBJECTIVE:  Note: Objective measures were completed at Evaluation unless otherwise noted.  DIAGNOSTIC FINDINGS: n/a  COGNITION: Overall cognitive status: Within functional limits for tasks assessed   SENSATION: WFL; slight decrease to RLE   COORDINATION: Heel slide: limited LLE   MUSCLE TONE: LLE: Mild and Moderate  POSTURE: No Significant postural limitations  LOWER EXTREMITY ROM:     WFL  LOWER EXTREMITY MMT:    MMT Right Eval Left Eval  Hip flexion 6.4 4.2  Hip extension    Hip abduction 7.9 3.4  Hip adduction 7.5 8.5  Hip internal rotation    Hip external rotation    Knee flexion 11.1 10.2  Knee extension 7.8 10  Ankle  dorsiflexion 10.8 6.8  Ankle plantarflexion 7.2 6.9  Ankle inversion    Ankle eversion    (Blank rows = not tested)  BED MOBILITY:  Scooting into bed is very challenging at end of night  TRANSFERS: Assistive device utilized: Single point cane  Sit to stand: Complete Independence Stand to sit: Complete Independence Chair to chair: Complete Independence  STAIRS: Level of Assistance: CGA Stair Negotiation Technique: Alternating Pattern  with Single Rail on Right Number of Stairs: 5  Height of Stairs: 6 inch   GAIT: Gait pattern: step through pattern and poor foot clearance- Left Distance walked: 60 ft Assistive device utilized: None Level of assistance: CGA Comments: L foot scuffing  FUNCTIONAL TESTS:  5 times sit to stand: 13 seconds with arms crossed.  6 minute walk test: next session  Functional gait assessment: 14/30  PATIENT SURVEYS:  ABC scale 60.5%                                                                                                                               TREATMENT DATE: 11/21/23   Gait belt donned with CGA for all standing activities, thorough therapeutic rest breaks provided throughout session  TherAct:  Patient ambulated 25 min indoor/outdoor today- including tile, concrete, horizontal head turns, on varied surfaces: inclines, declines- with 1  standing rest break - CGA for all indoor/outdoor activities - 0 instances of instability when ambulating - Focusing on overall endurance and to improve ambulation across differing terrain that pt would come in contact with in the community.   TherEx:  Seated leg press, 70#, 2x15 Seated calf extensions, 70#, 2x15   PATIENT EDUCATION: Education details: Exercise technique; importance of daily stretching Person educated: Patient Education method: Explanation, Demonstration, Tactile cues, and Verbal cues Education comprehension: verbalized understanding, returned demonstration, verbal cues required,  and tactile cues required   HOME EXERCISE PROGRAM: Access Code: [email or web address] URL: [email or web address] Date: 08/13/2023 Prepared by: Reyes London  Exercises - Hooklying Single Knee to Chest Stretch  - 1-2 x daily - 3 sets - 30 sec  hold - Supine Lower Trunk Rotation  - 1-2 x daily - 3 sets - 30 sec hold - Supine Hamstring Stretch with Strap  - 1-2 x daily - 3 sets - 30 sec hold - Seated Hamstring Stretch  - 1-2 x daily - 3 sets - 30 sec hold - Supine Hip Internal and External Rotation  - 1-2 x daily - 3 sets - 10 reps - Seated Hip External Rotation Stretch  - 1 x daily - 3 sets - 30 sec hold - Supine Figure 4 Piriformis Stretch  - 1 x daily - 3 sets - 30 sec hold   Access Code: [email or web address] URL: [email or web address] Date: 05/29/2023 Prepared by: Marina  Moser  Exercises - Towel Scrunches  - 1 x daily - 7 x weekly - 2 sets - 10 reps - 5 hold - Toe Spreading  - 1 x daily - 7 x weekly - 2 sets - 10 reps - 5 hold - Seated Ankle Alphabet  - 1 x daily - 7 x weekly - 2 sets - 1 reps - 5 hold  GOALS: Goals reviewed with patient? Yes  SHORT TERM GOALS: Target date: 05/22/2023  Patient will be independent in home exercise program to improve strength/mobility for better functional independence with ADLs. Baseline: 4/14: compliant  Goal status: MET    LONG TERM GOALS: Target date: 12/31/2023  Patient will increase Functional Gait Assessment score to >25/30 as to reduce fall risk and improve dynamic gait safety with community ambulation. Baseline: 1/28: 14/30   4/14: 17/30   6/11: 18/30  7/14: 20/30 Goal status: Partially Met  2.   Patient (< 60 years old) will complete five times sit to stand test in < 10 seconds indicating an increased LE strength and improved balance. Baseline: 1/28: 13 seconds  4/14: 10.72 seconds no hands  6/11: 10 seconds no hands  Goal status: MET  3.  Patient will increase six minute walk test distance to >1000 for progression  to community ambulator and improve gait ability Baseline: Perform next session 3/4: 820 ft  4/14: 890 ft with [facility]   6/11: 888 ft with [facility]  7/14: 870 ft with [facility]PC- only one instance of L foot catching on ground throughout test  Goal status: Partially Met   4.  Patient will return to nightly ambulation's with less than five foot scuffs per ambulation to return to PLOF.  Baseline: 1/28: limited ambulation  4/14: some limitations pending on day. Encouraged to perform multiple short walks  6/11: started doing nightly walks ; decreased foot scuffs   7/14: pt reports no scuffs Goal status: MET  5. Patient will increase ABC scale score >80% to demonstrate better functional mobility and better confidence with ADLs.  Baseline: 1/28: 60%  4/14: 65%   6/11: 68%  7/14: 59.38% Goal status: Partially Met  6.  Pt will complete 30 minutes of low-impact cardio exercise (such as stationary cycling) 3 times per week for the next 6 weeks, gradually increasing the duration by 5 minutes every 2 weeks, to improve my cardiovascular endurance while monitoring for signs of fatigue or overheating. Baseline: 10/22/23: pt able to achieve 1 bout of 20 min in a week Goal Status: NEW   ASSESSMENT:  CLINICAL IMPRESSION:    Pt ambulated across varying surfaces today, this time, without the support of her cane.  Pt noted to perform well without any LOB even though she voiced her unsteadiness throughout the ambulation attempt.  Pt did require one standing rest break, however pt performed well without the need of the cane.  Pt ambulates best when not thinking about walking and can keep her mind on conversation.  Pt encouraged to continue goal of advancing biking at home as part of current goal.   Pt will continue to benefit from skilled therapy to address remaining deficits in order to improve overall QoL and return to PLOF.         OBJECTIVE IMPAIRMENTS: Abnormal gait, decreased activity tolerance, decreased balance,  decreased coordination, decreased endurance, decreased mobility, difficulty walking, decreased strength, impaired perceived functional ability, impaired flexibility, impaired UE functional use, and improper body mechanics.   ACTIVITY LIMITATIONS: carrying, lifting, bending, sitting, standing, squatting, stairs, transfers, bed mobility, dressing, reach over head, hygiene/grooming, locomotion level, and caring for others  PARTICIPATION LIMITATIONS: meal prep, cleaning, laundry, interpersonal relationship, driving, shopping, community activity, and occupation  PERSONAL FACTORS: Age, Fitness, Past/current experiences, Profession, Time since onset of injury/illness/exacerbation, and 1-2 comorbidities: MS anxiety are also affecting patient's functional outcome.   REHAB POTENTIAL: Good  CLINICAL DECISION MAKING: Evolving/moderate complexity  EVALUATION COMPLEXITY: Moderate  PLAN:  PT FREQUENCY: 2x/week  PT DURATION: 12 weeks  PLANNED INTERVENTIONS: 97164- PT Re-evaluation, 97110-Therapeutic exercises, 97530- Therapeutic activity, 97112- Neuromuscular re-education, 97535- Self Care, [id number]- Manual therapy, [phone]- Gait training, [phone]- Orthotic Fit/training, [phone]- Canalith repositioning, Patient/Family education, Balance training, Stair training, Taping, Dry Needling, Joint mobilization, Joint manipulation, Spinal mobilization, Vestibular training, Visual/preceptual remediation/compensation, DME instructions, Cryotherapy, and Moist heat  PLAN FOR NEXT SESSION:    airex pad/balance, LLE strengthening, HEP , LLE coordination. Adding cognitive/manual dual task with dynamic balance activities     Fonda Simpers, PT, DPT Physical Therapist - [facility]  11/21/23, 3:24 PM

## 2023-11-28 ENCOUNTER — Ambulatory Visit: Attending: Neurology

## 2023-11-28 DIAGNOSIS — R262 Difficulty in walking, not elsewhere classified: Secondary | ICD-10-CM | POA: Diagnosis present

## 2023-11-28 DIAGNOSIS — M25521 Pain in right elbow: Secondary | ICD-10-CM | POA: Insufficient documentation

## 2023-11-28 DIAGNOSIS — Z9181 History of falling: Secondary | ICD-10-CM | POA: Insufficient documentation

## 2023-11-28 DIAGNOSIS — M6281 Muscle weakness (generalized): Secondary | ICD-10-CM | POA: Insufficient documentation

## 2023-11-28 DIAGNOSIS — M25621 Stiffness of right elbow, not elsewhere classified: Secondary | ICD-10-CM | POA: Insufficient documentation

## 2023-11-28 DIAGNOSIS — R2681 Unsteadiness on feet: Secondary | ICD-10-CM | POA: Diagnosis present

## 2023-11-28 DIAGNOSIS — R269 Unspecified abnormalities of gait and mobility: Secondary | ICD-10-CM | POA: Diagnosis present

## 2023-11-28 NOTE — Therapy (Signed)
 OUTPATIENT PHYSICAL THERAPY NEURO TREATMENT  Patient Name: Michelle Ruiz MRN: [id number] DOB:09-15-1970, 53 y.o., female Today's Date: 11/28/2023   PCP: Sherial Bail  REFERRING PROVIDER: Maree Hila K  END OF SESSION:  PT End of Session - 11/28/23 1324     Visit Number 38    Number of Visits 51    Date for PT Re-Evaluation 12/31/23    Progress Note Due on Visit 70    PT Start Time 1319    PT Stop Time 1400    PT Time Calculation (min) 41 min    Equipment Utilized During Treatment Gait belt    Activity Tolerance Patient tolerated treatment well    Behavior During Therapy WFL for tasks assessed/performed           Past Medical History:  Diagnosis Date   Abnormal Pap smear of cervix    Actinic keratosis 09/17/2023   R spinal mid upper back   Anxiety    Basal cell carcinoma 04/14/2008   Right upper ant. thigh. BCC with sclerosis.   Basal cell carcinoma 08/14/2008   Right lat. lower thigh. Superficial.    Basal cell carcinoma 04/29/2019   Right mid forearm. Superficial and nodular patterns. EDC.   Basal cell carcinoma 08/17/2020   Right upper antecubital. EDC.   Basal cell carcinoma 08/23/2021   Left Lower Leg, EDC   Basal cell carcinoma 09/17/2023   L medial lower leg, EDC   Kidney stones    MS (multiple sclerosis) (HCC)    UTI (urinary tract infection)    Past Surgical History:  Procedure Laterality Date   BREAST BIOPSY Right 2010   stereotactic biopsy/ neg/ dr.byrnett   BREAST BIOPSY Left 2012   neg/dr. brynett   CESAREAN SECTION  2008   RPH   TONSILLECTOMY  2006   There are no active problems to display for this patient.   ONSET DATE: diagnosed 12 years ago.   REFERRING DIAG: MS, gait and mobility   THERAPY DIAG:  Difficulty in walking, not elsewhere classified  Abnormality of gait and mobility  Unsteadiness on feet  Muscle weakness (generalized)  History of falling  Stiffness of right elbow, not elsewhere classified  Pain in right  elbow  Rationale for Evaluation and Treatment: Rehabilitation  SUBJECTIVE:                                                                                                                                                                                             SUBJECTIVE STATEMENT:  Pt reports that her pup is pretty much potty trained and is doing well.  Pt reports that  she attempted to bike last night and noted one episode of clonus when she hit her foot on the bike the first time.  Pt noted she was able to do 25 minutes begrudgingly.     Pt accompanied by: self  PERTINENT HISTORY: Patient was seen by physical therapy until September of last year. Pmh includes MS, UTI, anxiety. Has extreme spasticity in LLE. Extreme cold and heat affect her LLE. Patient is now taking hormonal medication. Patient is a Chartered certified accountant.   PAIN:  Are you having pain? No  PRECAUTIONS: None  RED FLAGS: None   WEIGHT BEARING RESTRICTIONS: No  FALLS: Has patient fallen in last 6 months? No  LIVING ENVIRONMENT: Lives with: lives with their family Lives in: House/apartment Stairs: no stairs to enter, lives on second floor Has following equipment at home: Single point cane and shower chair  PLOF: Independent  PATIENT GOALS: continue walking as long as she can.   OBJECTIVE:  Note: Objective measures were completed at Evaluation unless otherwise noted.  DIAGNOSTIC FINDINGS: n/a  COGNITION: Overall cognitive status: Within functional limits for tasks assessed   SENSATION: WFL; slight decrease to RLE   COORDINATION: Heel slide: limited LLE   MUSCLE TONE: LLE: Mild and Moderate  POSTURE: No Significant postural limitations  LOWER EXTREMITY ROM:     WFL  LOWER EXTREMITY MMT:    MMT Right Eval Left Eval  Hip flexion 6.4 4.2  Hip extension    Hip abduction 7.9 3.4  Hip adduction 7.5 8.5  Hip internal rotation    Hip external rotation    Knee flexion 11.1 10.2  Knee extension  7.8 10  Ankle dorsiflexion 10.8 6.8  Ankle plantarflexion 7.2 6.9  Ankle inversion    Ankle eversion    (Blank rows = not tested)  BED MOBILITY:  Scooting into bed is very challenging at end of night  TRANSFERS: Assistive device utilized: Single point cane  Sit to stand: Complete Independence Stand to sit: Complete Independence Chair to chair: Complete Independence  STAIRS: Level of Assistance: CGA Stair Negotiation Technique: Alternating Pattern  with Single Rail on Right Number of Stairs: 5  Height of Stairs: 6 inch   GAIT: Gait pattern: step through pattern and poor foot clearance- Left Distance walked: 60 ft Assistive device utilized: None Level of assistance: CGA Comments: L foot scuffing  FUNCTIONAL TESTS:  5 times sit to stand: 13 seconds with arms crossed.  6 minute walk test: next session  Functional gait assessment: 14/30  PATIENT SURVEYS:  ABC scale 60.5%                                                                                                                               TREATMENT DATE: 11/28/23   Gait belt donned with CGA for all standing activities, thorough therapeutic rest breaks provided throughout session.   Gait:  Ambulation around the gym to the [facility] and back with consistent verbal  cuing for increased step length and improved upward gaze.  All ambulation performed without the use of AD,  ~1000'   TherEx:  Seated leg press, 77.5#, 2x10   Neuro:  Static stance on Airex pad, 30 sec bouts Static NBOS on Airex pad, 30 sec bouts Static NBOS on Airex pad, eyes closed, 30 sec bouts Static staggered stance on Airex pad, 30 sec bouts    PATIENT EDUCATION: Education details: Exercise technique; importance of daily stretching Person educated: Patient Education method: Explanation, Demonstration, Tactile cues, and Verbal cues Education comprehension: verbalized understanding, returned demonstration, verbal cues required, and  tactile cues required   HOME EXERCISE PROGRAM: Access Code: [email or web address] URL: [email or web address] Date: 08/13/2023 Prepared by: Reyes London  Exercises - Hooklying Single Knee to Chest Stretch  - 1-2 x daily - 3 sets - 30 sec  hold - Supine Lower Trunk Rotation  - 1-2 x daily - 3 sets - 30 sec hold - Supine Hamstring Stretch with Strap  - 1-2 x daily - 3 sets - 30 sec hold - Seated Hamstring Stretch  - 1-2 x daily - 3 sets - 30 sec hold - Supine Hip Internal and External Rotation  - 1-2 x daily - 3 sets - 10 reps - Seated Hip External Rotation Stretch  - 1 x daily - 3 sets - 30 sec hold - Supine Figure 4 Piriformis Stretch  - 1 x daily - 3 sets - 30 sec hold   Access Code: [email or web address] URL: [email or web address] Date: 05/29/2023 Prepared by: Marina  Moser  Exercises - Towel Scrunches  - 1 x daily - 7 x weekly - 2 sets - 10 reps - 5 hold - Toe Spreading  - 1 x daily - 7 x weekly - 2 sets - 10 reps - 5 hold - Seated Ankle Alphabet  - 1 x daily - 7 x weekly - 2 sets - 1 reps - 5 hold  GOALS: Goals reviewed with patient? Yes  SHORT TERM GOALS: Target date: 05/22/2023  Patient will be independent in home exercise program to improve strength/mobility for better functional independence with ADLs. Baseline: 4/14: compliant  Goal status: MET    LONG TERM GOALS: Target date: 12/31/2023  Patient will increase Functional Gait Assessment score to >25/30 as to reduce fall risk and improve dynamic gait safety with community ambulation. Baseline: 1/28: 14/30   4/14: 17/30   6/11: 18/30  7/14: 20/30 Goal status: Partially Met  2.   Patient (< 45 years old) will complete five times sit to stand test in < 10 seconds indicating an increased LE strength and improved balance. Baseline: 1/28: 13 seconds  4/14: 10.72 seconds no hands  6/11: 10 seconds no hands  Goal status: MET  3.  Patient will increase six minute walk test distance to >1000 for progression to  community ambulator and improve gait ability Baseline: Perform next session 3/4: 820 ft  4/14: 890 ft with [facility]   6/11: 888 ft with [facility]  7/14: 870 ft with [facility]- only one instance of L foot catching on ground throughout test  Goal status: Partially Met   4.  Patient will return to nightly ambulation's with less than five foot scuffs per ambulation to return to PLOF.  Baseline: 1/28: limited ambulation  4/14: some limitations pending on day. Encouraged to perform multiple short walks  6/11: started doing nightly walks ; decreased foot scuffs   7/14: pt reports no scuffs Goal status: MET  5. Patient will increase ABC scale score >  80% to demonstrate better functional mobility and better confidence with ADLs.  Baseline: 1/28: 60%   4/14: 65%   6/11: 68%  7/14: 59.38% Goal status: Partially Met  6.  Pt will complete 30 minutes of low-impact cardio exercise (such as stationary cycling) 3 times per week for the next 6 weeks, gradually increasing the duration by 5 minutes every 2 weeks, to improve my cardiovascular endurance while monitoring for signs of fatigue or overheating. Baseline: 10/22/23: pt able to achieve 1 bout of 20 min in a week Goal Status: NEW   ASSESSMENT:  CLINICAL IMPRESSION:    Pt reported no complications throughout the session, and was able to complete the ambulation attempt without an AD.  Pt more steady in gait with distraction/conversation.  Pt still ambulates with lowered gaze as well and needs consistent reminder of upward gaze.  Pt will continue to improve overall tolerance to endurance training as well.  Pt will continue to benefit from skilled therapy to address remaining deficits in order to improve overall QoL and return to PLOF.        OBJECTIVE IMPAIRMENTS: Abnormal gait, decreased activity tolerance, decreased balance, decreased coordination, decreased endurance, decreased mobility, difficulty walking, decreased strength, impaired perceived functional  ability, impaired flexibility, impaired UE functional use, and improper body mechanics.   ACTIVITY LIMITATIONS: carrying, lifting, bending, sitting, standing, squatting, stairs, transfers, bed mobility, dressing, reach over head, hygiene/grooming, locomotion level, and caring for others  PARTICIPATION LIMITATIONS: meal prep, cleaning, laundry, interpersonal relationship, driving, shopping, community activity, and occupation  PERSONAL FACTORS: Age, Fitness, Past/current experiences, Profession, Time since onset of injury/illness/exacerbation, and 1-2 comorbidities: MS anxiety are also affecting patient's functional outcome.   REHAB POTENTIAL: Good  CLINICAL DECISION MAKING: Evolving/moderate complexity  EVALUATION COMPLEXITY: Moderate  PLAN:  PT FREQUENCY: 2x/week  PT DURATION: 12 weeks  PLANNED INTERVENTIONS: 97164- PT Re-evaluation, 97110-Therapeutic exercises, 97530- Therapeutic activity, 97112- Neuromuscular re-education, 97535- Self Care, 02859- Manual therapy, [phone]- Gait training, [phone]- Orthotic Fit/training, [phone]- Canalith repositioning, Patient/Family education, Balance training, Stair training, Taping, Dry Needling, Joint mobilization, Joint manipulation, Spinal mobilization, Vestibular training, Visual/preceptual remediation/compensation, DME instructions, Cryotherapy, and Moist heat  PLAN FOR NEXT SESSION:    airex pad/balance, LLE strengthening, HEP , LLE coordination. Adding cognitive/manual dual task with dynamic balance activities     Fonda Simpers, PT, DPT Physical Therapist - [facility]  [facility]  11/28/23, 1:24 PM

## 2023-12-03 ENCOUNTER — Ambulatory Visit

## 2023-12-03 DIAGNOSIS — M25621 Stiffness of right elbow, not elsewhere classified: Secondary | ICD-10-CM

## 2023-12-03 DIAGNOSIS — R262 Difficulty in walking, not elsewhere classified: Secondary | ICD-10-CM

## 2023-12-03 DIAGNOSIS — R269 Unspecified abnormalities of gait and mobility: Secondary | ICD-10-CM

## 2023-12-03 DIAGNOSIS — M6281 Muscle weakness (generalized): Secondary | ICD-10-CM

## 2023-12-03 DIAGNOSIS — M25521 Pain in right elbow: Secondary | ICD-10-CM

## 2023-12-03 DIAGNOSIS — Z9181 History of falling: Secondary | ICD-10-CM

## 2023-12-03 DIAGNOSIS — R2681 Unsteadiness on feet: Secondary | ICD-10-CM

## 2023-12-03 NOTE — Therapy (Signed)
 OUTPATIENT PHYSICAL THERAPY NEURO TREATMENT  Patient Name: Michelle Ruiz MRN: [id number] DOB:1970-05-21, 53 y.o., female Today's Date: 12/03/2023   PCP: Sherial Bail  REFERRING PROVIDER: Maree Hila K  END OF SESSION:  PT End of Session - 12/03/23 1343     Visit Number 39    Number of Visits 51    Date for PT Re-Evaluation 12/31/23    Progress Note Due on Visit 70    PT Start Time 1317    PT Stop Time 1400    PT Time Calculation (min) 43 min    Equipment Utilized During Treatment Gait belt    Activity Tolerance Patient tolerated treatment well    Behavior During Therapy WFL for tasks assessed/performed           Past Medical History:  Diagnosis Date   Abnormal Pap smear of cervix    Actinic keratosis 09/17/2023   R spinal mid upper back   Anxiety    Basal cell carcinoma 04/14/2008   Right upper ant. thigh. BCC with sclerosis.   Basal cell carcinoma 08/14/2008   Right lat. lower thigh. Superficial.    Basal cell carcinoma 04/29/2019   Right mid forearm. Superficial and nodular patterns. EDC.   Basal cell carcinoma 08/17/2020   Right upper antecubital. EDC.   Basal cell carcinoma 08/23/2021   Left Lower Leg, EDC   Basal cell carcinoma 09/17/2023   L medial lower leg, EDC   Kidney stones    MS (multiple sclerosis) (HCC)    UTI (urinary tract infection)    Past Surgical History:  Procedure Laterality Date   BREAST BIOPSY Right 2010   stereotactic biopsy/ neg/ dr.byrnett   BREAST BIOPSY Left 2012   neg/dr. brynett   CESAREAN SECTION  2008   RPH   TONSILLECTOMY  2006   There are no active problems to display for this patient.   ONSET DATE: diagnosed 12 years ago.   REFERRING DIAG: MS, gait and mobility   THERAPY DIAG:  Difficulty in walking, not elsewhere classified  Abnormality of gait and mobility  Unsteadiness on feet  Muscle weakness (generalized)  History of falling  Stiffness of right elbow, not elsewhere classified  Pain in right  elbow  Rationale for Evaluation and Treatment: Rehabilitation  SUBJECTIVE:                                                                                                                                                                                             SUBJECTIVE STATEMENT:  Pt reports that her pup is pretty much potty trained and is doing well.  Pt reports that  she attempted to bike last night and noted one episode of clonus when she hit her foot on the bike the first time.  Pt noted she was able to do 25 minutes begrudgingly.     Pt accompanied by: self  PERTINENT HISTORY: Patient was seen by physical therapy until September of last year. Pmh includes MS, UTI, anxiety. Has extreme spasticity in LLE. Extreme cold and heat affect her LLE. Patient is now taking hormonal medication. Patient is a Chartered certified accountant.   PAIN:  Are you having pain? No  PRECAUTIONS: None  RED FLAGS: None   WEIGHT BEARING RESTRICTIONS: No  FALLS: Has patient fallen in last 6 months? No  LIVING ENVIRONMENT: Lives with: lives with their family Lives in: House/apartment Stairs: no stairs to enter, lives on second floor Has following equipment at home: Single point cane and shower chair  PLOF: Independent  PATIENT GOALS: continue walking as long as she can.   OBJECTIVE:  Note: Objective measures were completed at Evaluation unless otherwise noted.  DIAGNOSTIC FINDINGS: n/a  COGNITION: Overall cognitive status: Within functional limits for tasks assessed   SENSATION: WFL; slight decrease to RLE   COORDINATION: Heel slide: limited LLE   MUSCLE TONE: LLE: Mild and Moderate  POSTURE: No Significant postural limitations  LOWER EXTREMITY ROM:     WFL  LOWER EXTREMITY MMT:    MMT Right Eval Left Eval  Hip flexion 6.4 4.2  Hip extension    Hip abduction 7.9 3.4  Hip adduction 7.5 8.5  Hip internal rotation    Hip external rotation    Knee flexion 11.1 10.2  Knee extension  7.8 10  Ankle dorsiflexion 10.8 6.8  Ankle plantarflexion 7.2 6.9  Ankle inversion    Ankle eversion    (Blank rows = not tested)  BED MOBILITY:  Scooting into bed is very challenging at end of night  TRANSFERS: Assistive device utilized: Single point cane  Sit to stand: Complete Independence Stand to sit: Complete Independence Chair to chair: Complete Independence  STAIRS: Level of Assistance: CGA Stair Negotiation Technique: Alternating Pattern  with Single Rail on Right Number of Stairs: 5  Height of Stairs: 6 inch   GAIT: Gait pattern: step through pattern and poor foot clearance- Left Distance walked: 60 ft Assistive device utilized: None Level of assistance: CGA Comments: L foot scuffing  FUNCTIONAL TESTS:  5 times sit to stand: 13 seconds with arms crossed.  6 minute walk test: next session  Functional gait assessment: 14/30  PATIENT SURVEYS:  ABC scale 60.5%                                                                                                                               TREATMENT DATE: 12/03/23   Gait belt donned with CGA for all standing activities, thorough therapeutic rest breaks provided throughout session.   Gait:  Patient ambulated 25 min indoor/outdoor today- including tile, concrete, carpet, horizontal head turns,  on varied surfaces: concrete, brick, asphalt, mulch, grass, inclines, declines - with 0 seated rest break(s) - CGA for all indoor/outdoor - 0 instance of instability when ambulating - Focusing on overall endurance and to improve ambulation across uneven terrain that pt would come in contact with in the community.  Pt also donning new [employer]ense shoes to prevent scuffing, which was evident as the pt progressively got more tired.     Neuro:  Patient participated in balance training using the [employer] game on the [employer] platform for 14 minutes at stability level 6.0. The task involved navigating a digital ball across a screen and  shifting the center of gravity on a dynamic platform in order to break bricks. This activity targets postural control, weight shifting, proprioception, and visual-motor coordination, and promotes core stability and reactive balance strategies. The patient required supervision level of assistance and minimal verbal cueing.       PATIENT EDUCATION: Education details: Exercise technique; importance of daily stretching Person educated: Patient Education method: Explanation, Demonstration, Tactile cues, and Verbal cues Education comprehension: verbalized understanding, returned demonstration, verbal cues required, and tactile cues required   HOME EXERCISE PROGRAM: Access Code: [email or web address] URL: [email or web address] Date: 08/13/2023 Prepared by: Reyes London  Exercises - Hooklying Single Knee to Chest Stretch  - 1-2 x daily - 3 sets - 30 sec  hold - Supine Lower Trunk Rotation  - 1-2 x daily - 3 sets - 30 sec hold - Supine Hamstring Stretch with Strap  - 1-2 x daily - 3 sets - 30 sec hold - Seated Hamstring Stretch  - 1-2 x daily - 3 sets - 30 sec hold - Supine Hip Internal and External Rotation  - 1-2 x daily - 3 sets - 10 reps - Seated Hip External Rotation Stretch  - 1 x daily - 3 sets - 30 sec hold - Supine Figure 4 Piriformis Stretch  - 1 x daily - 3 sets - 30 sec hold   Access Code: [email or web address] URL: [email or web address] Date: 05/29/2023 Prepared by: Marina  Moser  Exercises - Towel Scrunches  - 1 x daily - 7 x weekly - 2 sets - 10 reps - 5 hold - Toe Spreading  - 1 x daily - 7 x weekly - 2 sets - 10 reps - 5 hold - Seated Ankle Alphabet  - 1 x daily - 7 x weekly - 2 sets - 1 reps - 5 hold  GOALS: Goals reviewed with patient? Yes  SHORT TERM GOALS: Target date: 05/22/2023  Patient will be independent in home exercise program to improve strength/mobility for better functional independence with ADLs. Baseline: 4/14: compliant  Goal status: MET     LONG TERM GOALS: Target date: 12/31/2023  Patient will increase Functional Gait Assessment score to >25/30 as to reduce fall risk and improve dynamic gait safety with community ambulation. Baseline: 1/28: 14/30   4/14: 17/30   6/11: 18/30  7/14: 20/30 Goal status: Partially Met  2.   Patient (< 40 years old) will complete five times sit to stand test in < 10 seconds indicating an increased LE strength and improved balance. Baseline: 1/28: 13 seconds  4/14: 10.72 seconds no hands  6/11: 10 seconds no hands  Goal status: MET  3.  Patient will increase six minute walk test distance to >1000 for progression to community ambulator and improve gait ability Baseline: Perform next session 3/4: 820 ft  4/14: 890 ft with [facility]   6/11: 888 ft with [facility]  7/14: 870  ft with [facility]- only one instance of L foot catching on ground throughout test  Goal status: Partially Met   4.  Patient will return to nightly ambulation's with less than five foot scuffs per ambulation to return to PLOF.  Baseline: 1/28: limited ambulation  4/14: some limitations pending on day. Encouraged to perform multiple short walks  6/11: started doing nightly walks ; decreased foot scuffs   7/14: pt reports no scuffs Goal status: MET  5. Patient will increase ABC scale score >80% to demonstrate better functional mobility and better confidence with ADLs.  Baseline: 1/28: 60%   4/14: 65%   6/11: 68%  7/14: 59.38% Goal status: Partially Met  6.  Pt will complete 30 minutes of low-impact cardio exercise (such as stationary cycling) 3 times per week for the next 6 weeks, gradually increasing the duration by 5 minutes every 2 weeks, to improve my cardiovascular endurance while monitoring for signs of fatigue or overheating. Baseline: 10/22/23: pt able to achieve 1 bout of 20 min in a week Goal Status: NEW   ASSESSMENT:  CLINICAL IMPRESSION:    Pt performed well with the new shoes and was able to safely navigate outside  without an AD.  With the new shoes, pt was able to perform ambulation and when normally she would have experienced a scuffing event, the shoes allowed her to slide on the floor and keep balance.  Pt most notably experiencing this when ambulating for a prolonged period of time.  Pt continues to work towards her biking goal at home as well and was able to complete 25 minutes of biking on 2 different days last week.   Pt will continue to benefit from skilled therapy to address remaining deficits in order to improve overall QoL and return to PLOF.         OBJECTIVE IMPAIRMENTS: Abnormal gait, decreased activity tolerance, decreased balance, decreased coordination, decreased endurance, decreased mobility, difficulty walking, decreased strength, impaired perceived functional ability, impaired flexibility, impaired UE functional use, and improper body mechanics.   ACTIVITY LIMITATIONS: carrying, lifting, bending, sitting, standing, squatting, stairs, transfers, bed mobility, dressing, reach over head, hygiene/grooming, locomotion level, and caring for others  PARTICIPATION LIMITATIONS: meal prep, cleaning, laundry, interpersonal relationship, driving, shopping, community activity, and occupation  PERSONAL FACTORS: Age, Fitness, Past/current experiences, Profession, Time since onset of injury/illness/exacerbation, and 1-2 comorbidities: MS anxiety are also affecting patient's functional outcome.   REHAB POTENTIAL: Good  CLINICAL DECISION MAKING: Evolving/moderate complexity  EVALUATION COMPLEXITY: Moderate  PLAN:  PT FREQUENCY: 2x/week  PT DURATION: 12 weeks  PLANNED INTERVENTIONS: 97164- PT Re-evaluation, 97110-Therapeutic exercises, 97530- Therapeutic activity, 97112- Neuromuscular re-education, 97535- Self Care, [id number]- Manual therapy, [phone]- Gait training, [phone]- Orthotic Fit/training, [phone]- Canalith repositioning, Patient/Family education, Balance training, Stair training, Taping, Dry  Needling, Joint mobilization, Joint manipulation, Spinal mobilization, Vestibular training, Visual/preceptual remediation/compensation, DME instructions, Cryotherapy, and Moist heat  PLAN FOR NEXT SESSION:    airex pad/balance, LLE strengthening, HEP , LLE coordination. Adding cognitive/manual dual task with dynamic balance activities     Fonda Simpers, PT, DPT Physical Therapist - [facility]  12/03/23, 3:13 PM

## 2023-12-05 ENCOUNTER — Ambulatory Visit

## 2023-12-05 DIAGNOSIS — R269 Unspecified abnormalities of gait and mobility: Secondary | ICD-10-CM

## 2023-12-05 DIAGNOSIS — Z9181 History of falling: Secondary | ICD-10-CM

## 2023-12-05 DIAGNOSIS — R262 Difficulty in walking, not elsewhere classified: Secondary | ICD-10-CM

## 2023-12-05 DIAGNOSIS — M6281 Muscle weakness (generalized): Secondary | ICD-10-CM

## 2023-12-05 DIAGNOSIS — M25621 Stiffness of right elbow, not elsewhere classified: Secondary | ICD-10-CM

## 2023-12-05 DIAGNOSIS — M25521 Pain in right elbow: Secondary | ICD-10-CM

## 2023-12-05 DIAGNOSIS — R2681 Unsteadiness on feet: Secondary | ICD-10-CM

## 2023-12-05 NOTE — Therapy (Signed)
 OUTPATIENT PHYSICAL THERAPY NEURO TREATMENT  Patient Name: Michelle Ruiz MRN: [id number] DOB:08-10-1970, 53 y.o., female Today's Date: 12/05/2023   PCP: Sherial Bail  REFERRING PROVIDER: Maree Hila K  END OF SESSION:  PT End of Session - 12/05/23 1321     Visit Number 40    Number of Visits 51    Date for PT Re-Evaluation 12/31/23    Progress Note Due on Visit 70    PT Start Time 1317    PT Stop Time 1400    PT Time Calculation (min) 43 min    Equipment Utilized During Treatment Gait belt    Activity Tolerance Patient tolerated treatment well    Behavior During Therapy WFL for tasks assessed/performed           Past Medical History:  Diagnosis Date   Abnormal Pap smear of cervix    Actinic keratosis 09/17/2023   R spinal mid upper back   Anxiety    Basal cell carcinoma 04/14/2008   Right upper ant. thigh. BCC with sclerosis.   Basal cell carcinoma 08/14/2008   Right lat. lower thigh. Superficial.    Basal cell carcinoma 04/29/2019   Right mid forearm. Superficial and nodular patterns. EDC.   Basal cell carcinoma 08/17/2020   Right upper antecubital. EDC.   Basal cell carcinoma 08/23/2021   Left Lower Leg, EDC   Basal cell carcinoma 09/17/2023   L medial lower leg, EDC   Kidney stones    MS (multiple sclerosis) (HCC)    UTI (urinary tract infection)    Past Surgical History:  Procedure Laterality Date   BREAST BIOPSY Right 2010   stereotactic biopsy/ neg/ dr.byrnett   BREAST BIOPSY Left 2012   neg/dr. brynett   CESAREAN SECTION  2008   RPH   TONSILLECTOMY  2006   There are no active problems to display for this patient.   ONSET DATE: diagnosed 12 years ago.   REFERRING DIAG: MS, gait and mobility   THERAPY DIAG:  No diagnosis found.  Rationale for Evaluation and Treatment: Rehabilitation  SUBJECTIVE:                                                                                                                                                                                              SUBJECTIVE STATEMENT:  Pt report she is sore today.  Pt notes her LE's are sore in the quads and hamstrings, which may have been from biking yesterday.   Pt accompanied by: self  PERTINENT HISTORY: Patient was seen by physical therapy until September of last year. Pmh includes MS, UTI, anxiety. Has extreme  spasticity in LLE. Extreme cold and heat affect her LLE. Patient is now taking hormonal medication. Patient is a Chartered certified accountant.   PAIN:  Are you having pain? No  PRECAUTIONS: None  RED FLAGS: None   WEIGHT BEARING RESTRICTIONS: No  FALLS: Has patient fallen in last 6 months? No  LIVING ENVIRONMENT: Lives with: lives with their family Lives in: House/apartment Stairs: no stairs to enter, lives on second floor Has following equipment at home: Single point cane and shower chair  PLOF: Independent  PATIENT GOALS: continue walking as long as she can.   OBJECTIVE:  Note: Objective measures were completed at Evaluation unless otherwise noted.  DIAGNOSTIC FINDINGS: n/a  COGNITION: Overall cognitive status: Within functional limits for tasks assessed   SENSATION: WFL; slight decrease to RLE   COORDINATION: Heel slide: limited LLE   MUSCLE TONE: LLE: Mild and Moderate  POSTURE: No Significant postural limitations  LOWER EXTREMITY ROM:     WFL  LOWER EXTREMITY MMT:    MMT Right Eval Left Eval  Hip flexion 6.4 4.2  Hip extension    Hip abduction 7.9 3.4  Hip adduction 7.5 8.5  Hip internal rotation    Hip external rotation    Knee flexion 11.1 10.2  Knee extension 7.8 10  Ankle dorsiflexion 10.8 6.8  Ankle plantarflexion 7.2 6.9  Ankle inversion    Ankle eversion    (Blank rows = not tested)  BED MOBILITY:  Scooting into bed is very challenging at end of night  TRANSFERS: Assistive device utilized: Single point cane  Sit to stand: Complete Independence Stand to sit: Complete Independence Chair  to chair: Complete Independence  STAIRS: Level of Assistance: CGA Stair Negotiation Technique: Alternating Pattern  with Single Rail on Right Number of Stairs: 5  Height of Stairs: 6 inch   GAIT: Gait pattern: step through pattern and poor foot clearance- Left Distance walked: 60 ft Assistive device utilized: None Level of assistance: CGA Comments: L foot scuffing  FUNCTIONAL TESTS:  5 times sit to stand: 13 seconds with arms crossed.  6 minute walk test: next session  Functional gait assessment: 14/30  PATIENT SURVEYS:  ABC scale 60.5%                                                                                                                               TREATMENT DATE: 12/05/23   Gait belt donned with CGA for all standing activities, thorough therapeutic rest breaks provided throughout session.  Physical Performance Testing:  Activities-specific Balance Confidence Scale:  Score: 56.88% Increased risk of falls in community-dwelling, older adults <80% (79.89%)  0% = no confidence - 100% = complete confidence (ANPTA Core Set of Outcome Measures for Adults with Neurologic Conditions, 2018)  Five times Sit to Stand Test (FTSS)  TIME: 14.44 sec  Cut off scores indicative of increased fall risk: >12 sec CVA, >16 sec PD, >13 sec vestibular (ANPTA Core Set of Outcome Measures  for Adults with Neurologic Conditions, 2018)   6 Min Walk Test:  Instructed patient to ambulate as quickly and as safely as possible for 6 minutes using LRAD. Patient was allowed to take standing rest breaks without stopping the test, but if the patient required a sitting rest break the clock would be stopped and the test would be over.  Results: 818 feet using a SPC with CGA. Results indicate that the patient has reduced endurance with ambulation compared to age matched norms.  Age Matched Norms (in meters): 46-69 yo M: 44 F: 59, 20-79 yo M: 50 F: 471, 47-89 yo M: 417 F: 392 MDC: 58.21 meters  (190.98 feet) or 50 meters (ANPTA Core Set of Outcome Measures for Adults with Neurologic Conditions, 2018)    Neuro:  Patient participated in balance training using the [employer] game on the [employer] platform for 15 minutes at stability level 6.0. The task involved navigating a digital ball across a screen and shifting the center of gravity on a dynamic platform in order to break bricks. This activity targets postural control, weight shifting, proprioception, and visual-motor coordination, and promotes core stability and reactive balance strategies. The patient required supervision level of assistance and minimal verbal cueing.       PATIENT EDUCATION: Education details: Exercise technique; importance of daily stretching Person educated: Patient Education method: Explanation, Demonstration, Tactile cues, and Verbal cues Education comprehension: verbalized understanding, returned demonstration, verbal cues required, and tactile cues required   HOME EXERCISE PROGRAM: Access Code: [email or web address] URL: [email or web address] Date: 08/13/2023 Prepared by: Reyes London  Exercises - Hooklying Single Knee to Chest Stretch  - 1-2 x daily - 3 sets - 30 sec  hold - Supine Lower Trunk Rotation  - 1-2 x daily - 3 sets - 30 sec hold - Supine Hamstring Stretch with Strap  - 1-2 x daily - 3 sets - 30 sec hold - Seated Hamstring Stretch  - 1-2 x daily - 3 sets - 30 sec hold - Supine Hip Internal and External Rotation  - 1-2 x daily - 3 sets - 10 reps - Seated Hip External Rotation Stretch  - 1 x daily - 3 sets - 30 sec hold - Supine Figure 4 Piriformis Stretch  - 1 x daily - 3 sets - 30 sec hold   Access Code: [email or web address] URL: [email or web address] Date: 05/29/2023 Prepared by: Marina  Moser  Exercises - Towel Scrunches  - 1 x daily - 7 x weekly - 2 sets - 10 reps - 5 hold - Toe Spreading  - 1 x daily - 7 x weekly - 2 sets - 10 reps - 5 hold - Seated Ankle  Alphabet  - 1 x daily - 7 x weekly - 2 sets - 1 reps - 5 hold    GOALS:  Goals reviewed with patient? Yes  SHORT TERM GOALS: Target date: 05/22/2023  Patient will be independent in home exercise program to improve strength/mobility for better functional independence with ADLs. Baseline: 4/14: compliant  Goal status: MET    LONG TERM GOALS: Target date: 12/31/2023  Patient will increase Functional Gait Assessment score to >25/30 as to reduce fall risk and improve dynamic gait safety with community ambulation. Baseline: 1/28: 14/30   4/14: 17/30   6/11: 18/30  7/14: 20/30 12/05/23:  Goal status: Partially Met  2.   Patient (< 22 years old) will complete five times sit to stand test in < 10 seconds indicating an increased LE strength and improved balance. Baseline: 1/28:  13 seconds  4/14: 10.72 seconds no hands  6/11: 10 seconds no hands  12/05/23: 14.44 sec no hands Goal status: Re-Assessed  3.  Patient will increase six minute walk test distance to >1000 for progression to community ambulator and improve gait ability Baseline: Perform next session 3/4: 820 ft  4/14: 890 ft with [facility]   6/11: 888 ft with [facility]  7/14: 870 ft with SPC- only one instance of L foot catching on ground throughout test 12/05/23: 818 ft with SPC and new [employer] shoes Goal status: Partially Met   4.  Patient will return to nightly ambulation's with less than five foot scuffs per ambulation to return to PLOF.  Baseline: 1/28: limited ambulation  4/14: some limitations pending on day. Encouraged to perform multiple short walks  6/11: started doing nightly walks ; decreased foot scuffs   7/14: pt reports no scuffs Goal status: MET  5. Patient will increase ABC scale score >80% to demonstrate better functional mobility and better confidence with ADLs.  Baseline: 1/28: 60%   4/14: 65%   6/11: 68%  7/14: 59.38% 12/05/23: 56.88% Goal status: Partially Met  6.  Pt will complete 30 minutes of low-impact  cardio exercise (such as stationary cycling) 3 times per week for the next 6 weeks, gradually increasing the duration by 5 minutes every 2 weeks, to improve my cardiovascular endurance while monitoring for signs of fatigue or overheating. Baseline: 10/22/23: pt able to achieve 1 bout of 20 min in a week 12/05/23: Pt able to achieve 2 bouts of 25 minutes last week.   Goal Status: NEW   ASSESSMENT:  CLINICAL IMPRESSION:    Pt noted to have a reduction in most of the goals assessed as noted above, however pt is also wearing new shoes in which she is not accustomed to at this point in time and is also experiencing increased soreness.  Pt noted to have maintained fairly close results regardless of the current results.  Anticipate pt will continue to maintain current levels or improve depending on how pt is feeling at the next re-cert/progress note.  Patient's condition has the potential to improve in response to therapy. Maximum improvement is yet to be obtained. The anticipated improvement is attainable and reasonable in a generally predictable time.   Pt will continue to benefit from skilled therapy to address remaining deficits in order to improve overall QoL and return to PLOF.      OBJECTIVE IMPAIRMENTS: Abnormal gait, decreased activity tolerance, decreased balance, decreased coordination, decreased endurance, decreased mobility, difficulty walking, decreased strength, impaired perceived functional ability, impaired flexibility, impaired UE functional use, and improper body mechanics.   ACTIVITY LIMITATIONS: carrying, lifting, bending, sitting, standing, squatting, stairs, transfers, bed mobility, dressing, reach over head, hygiene/grooming, locomotion level, and caring for others  PARTICIPATION LIMITATIONS: meal prep, cleaning, laundry, interpersonal relationship, driving, shopping, community activity, and occupation  PERSONAL FACTORS: Age, Fitness, Past/current experiences, Profession, Time  since onset of injury/illness/exacerbation, and 1-2 comorbidities: MS anxiety are also affecting patient's functional outcome.   REHAB POTENTIAL: Good  CLINICAL DECISION MAKING: Evolving/moderate complexity  EVALUATION COMPLEXITY: Moderate  PLAN:  PT FREQUENCY: 2x/week  PT DURATION: 12 weeks  PLANNED INTERVENTIONS: 97164- PT Re-evaluation, 97110-Therapeutic exercises, 97530- Therapeutic activity, 97112- Neuromuscular re-education, 97535- Self Care, [id number]- Manual therapy, [phone]- Gait training, [phone]- Orthotic Fit/training, [phone]- Canalith repositioning, Patient/Family education, Balance training, Stair training, Taping, Dry Needling, Joint mobilization, Joint manipulation, Spinal mobilization, Vestibular training, Visual/preceptual remediation/compensation, DME instructions, Cryotherapy, and Moist heat  PLAN  FOR NEXT SESSION:    airex pad/balance, LLE strengthening, HEP , LLE coordination. Adding cognitive/manual dual task with dynamic balance activities     Fonda Simpers, PT, DPT Physical Therapist - [facility]  [facility]  12/05/23, 1:22 PM

## 2023-12-10 ENCOUNTER — Ambulatory Visit

## 2023-12-10 DIAGNOSIS — M6281 Muscle weakness (generalized): Secondary | ICD-10-CM

## 2023-12-10 DIAGNOSIS — R262 Difficulty in walking, not elsewhere classified: Secondary | ICD-10-CM

## 2023-12-10 DIAGNOSIS — M25621 Stiffness of right elbow, not elsewhere classified: Secondary | ICD-10-CM

## 2023-12-10 DIAGNOSIS — Z9181 History of falling: Secondary | ICD-10-CM

## 2023-12-10 DIAGNOSIS — M25521 Pain in right elbow: Secondary | ICD-10-CM

## 2023-12-10 DIAGNOSIS — R269 Unspecified abnormalities of gait and mobility: Secondary | ICD-10-CM

## 2023-12-10 DIAGNOSIS — R2681 Unsteadiness on feet: Secondary | ICD-10-CM

## 2023-12-10 NOTE — Therapy (Signed)
 OUTPATIENT PHYSICAL THERAPY NEURO TREATMENT  Patient Name: Michelle Ruiz MRN: [id number] DOB:03/07/1971, 53 y.o., female Today's Date: 12/10/2023   PCP: Sherial Bail  REFERRING PROVIDER: Maree Hila K  END OF SESSION:   PT End of Session - 12/10/23 1320     Visit Number 41    Number of Visits 51    Date for PT Re-Evaluation 12/31/23    Progress Note Due on Visit 70    PT Start Time 1318    PT Stop Time 1400    PT Time Calculation (min) 42 min    Equipment Utilized During Treatment Gait belt    Activity Tolerance Patient tolerated treatment well    Behavior During Therapy WFL for tasks assessed/performed          Past Medical History:  Diagnosis Date   Abnormal Pap smear of cervix    Actinic keratosis 09/17/2023   R spinal mid upper back   Anxiety    Basal cell carcinoma 04/14/2008   Right upper ant. thigh. BCC with sclerosis.   Basal cell carcinoma 08/14/2008   Right lat. lower thigh. Superficial.    Basal cell carcinoma 04/29/2019   Right mid forearm. Superficial and nodular patterns. EDC.   Basal cell carcinoma 08/17/2020   Right upper antecubital. EDC.   Basal cell carcinoma 08/23/2021   Left Lower Leg, EDC   Basal cell carcinoma 09/17/2023   L medial lower leg, EDC   Kidney stones    MS (multiple sclerosis) (HCC)    UTI (urinary tract infection)    Past Surgical History:  Procedure Laterality Date   BREAST BIOPSY Right 2010   stereotactic biopsy/ neg/ dr.byrnett   BREAST BIOPSY Left 2012   neg/dr. brynett   CESAREAN SECTION  2008   RPH   TONSILLECTOMY  2006   There are no active problems to display for this patient.   ONSET DATE: diagnosed 12 years ago.   REFERRING DIAG: MS, gait and mobility   THERAPY DIAG:  Difficulty in walking, not elsewhere classified  Abnormality of gait and mobility  Unsteadiness on feet  Muscle weakness (generalized)  History of falling  Stiffness of right elbow, not elsewhere classified  Pain in  right elbow  Rationale for Evaluation and Treatment: Rehabilitation  SUBJECTIVE:                                                                                                                                                                                             SUBJECTIVE STATEMENT:  Pt reports she only was able to bike once over the past week.  Pt reports being fatigued  Thursday.     Pt accompanied by: self  PERTINENT HISTORY: Patient was seen by physical therapy until September of last year. Pmh includes MS, UTI, anxiety. Has extreme spasticity in LLE. Extreme cold and heat affect her LLE. Patient is now taking hormonal medication. Patient is a Chartered certified accountant.   PAIN:  Are you having pain? No  PRECAUTIONS: None  RED FLAGS: None   WEIGHT BEARING RESTRICTIONS: No  FALLS: Has patient fallen in last 6 months? No  LIVING ENVIRONMENT: Lives with: lives with their family Lives in: House/apartment Stairs: no stairs to enter, lives on second floor Has following equipment at home: Single point cane and shower chair  PLOF: Independent  PATIENT GOALS: continue walking as long as she can.   OBJECTIVE:  Note: Objective measures were completed at Evaluation unless otherwise noted.  DIAGNOSTIC FINDINGS: n/a  COGNITION: Overall cognitive status: Within functional limits for tasks assessed   SENSATION: WFL; slight decrease to RLE   COORDINATION: Heel slide: limited LLE   MUSCLE TONE: LLE: Mild and Moderate  POSTURE: No Significant postural limitations  LOWER EXTREMITY ROM:     WFL  LOWER EXTREMITY MMT:    MMT Right Eval Left Eval  Hip flexion 6.4 4.2  Hip extension    Hip abduction 7.9 3.4  Hip adduction 7.5 8.5  Hip internal rotation    Hip external rotation    Knee flexion 11.1 10.2  Knee extension 7.8 10  Ankle dorsiflexion 10.8 6.8  Ankle plantarflexion 7.2 6.9  Ankle inversion    Ankle eversion    (Blank rows = not tested)  BED MOBILITY:   Scooting into bed is very challenging at end of night  TRANSFERS: Assistive device utilized: Single point cane  Sit to stand: Complete Independence Stand to sit: Complete Independence Chair to chair: Complete Independence  STAIRS: Level of Assistance: CGA Stair Negotiation Technique: Alternating Pattern  with Single Rail on Right Number of Stairs: 5  Height of Stairs: 6 inch   GAIT: Gait pattern: step through pattern and poor foot clearance- Left Distance walked: 60 ft Assistive device utilized: None Level of assistance: CGA Comments: L foot scuffing  FUNCTIONAL TESTS:  5 times sit to stand: 13 seconds with arms crossed.  6 minute walk test: next session  Functional gait assessment: 14/30  PATIENT SURVEYS:  ABC scale 60.5%                                                                                                                               TREATMENT DATE: 12/10/23   Gait belt donned with CGA for all standing activities, thorough therapeutic rest breaks provided throughout session.  TherEx: To improve strength, endurance, mobility, and function of specific targeted muscle groups or improve joint range of motion or improve muscle flexibility  Seated resisted dorsiflexion with 3# AW rolled up and placed on forefoot, 2x15 each LE   Pt fatigued in the dorsiflexors and was given  belt to assist in pulling into full ROM and then allowing for the L LE to eccentrically lower to the ground, 2x10   Gait:  Ambulation to the cancer center and back (~1000') with verbal cues for proper form, including dorsiflexion of the L LE and flexion of the hip and knee for foot clearance.   Neuro:  The patient engaged in the [place] game on the [place] platform for 15 minutes at stability level 3.0. This game involves guiding a virtual ball through an obstacle course to collect coinds by shifting weight and adjusting posture on the balance board, requiring dynamic balance, core  stability, coordination, and spatial awareness. The activity challenges the patient's motor control and postural responses in a motivating, interactive environment. The patient performed the task with supervision assistance, with minimal verbal cueing, and demonstrated ability to engage the dorsiflexors for the tilting of the board.          PATIENT EDUCATION: Education details: Exercise technique; importance of daily stretching Person educated: Patient Education method: Explanation, Demonstration, Tactile cues, and Verbal cues Education comprehension: verbalized understanding, returned demonstration, verbal cues required, and tactile cues required   HOME EXERCISE PROGRAM: Access Code: [email or web address] URL: [email or web address] Date: 08/13/2023 Prepared by: Reyes London  Exercises - Hooklying Single Knee to Chest Stretch  - 1-2 x daily - 3 sets - 30 sec  hold - Supine Lower Trunk Rotation  - 1-2 x daily - 3 sets - 30 sec hold - Supine Hamstring Stretch with Strap  - 1-2 x daily - 3 sets - 30 sec hold - Seated Hamstring Stretch  - 1-2 x daily - 3 sets - 30 sec hold - Supine Hip Internal and External Rotation  - 1-2 x daily - 3 sets - 10 reps - Seated Hip External Rotation Stretch  - 1 x daily - 3 sets - 30 sec hold - Supine Figure 4 Piriformis Stretch  - 1 x daily - 3 sets - 30 sec hold   Access Code: [email or web address] URL: [email or web address] Date: 05/29/2023 Prepared by: Marina  Moser  Exercises - Towel Scrunches  - 1 x daily - 7 x weekly - 2 sets - 10 reps - 5 hold - Toe Spreading  - 1 x daily - 7 x weekly - 2 sets - 10 reps - 5 hold - Seated Ankle Alphabet  - 1 x daily - 7 x weekly - 2 sets - 1 reps - 5 hold    GOALS:  Goals reviewed with patient? Yes  SHORT TERM GOALS: Target date: 05/22/2023  Patient will be independent in home exercise program to improve strength/mobility for better functional independence with ADLs. Baseline: 4/14: compliant   Goal status: MET    LONG TERM GOALS: Target date: 12/31/2023  Patient will increase Functional Gait Assessment score to >25/30 as to reduce fall risk and improve dynamic gait safety with community ambulation. Baseline: 1/28: 14/30   4/14: 17/30   6/11: 18/30  7/14: 20/30 12/05/23:  Goal status: Partially Met  2.   Patient (< 21 years old) will complete five times sit to stand test in < 10 seconds indicating an increased LE strength and improved balance. Baseline: 1/28: 13 seconds  4/14: 10.72 seconds no hands  6/11: 10 seconds no hands  12/05/23: 14.44 sec no hands Goal status: Re-Assessed  3.  Patient will increase six minute walk test distance to >1000 for progression to community ambulator and improve gait ability Baseline: Perform next session 3/4: 820 ft  4/14: 890 ft  with [facility]   6/11: 888 ft with [facility]  7/14: 870 ft with SPC- only one instance of L foot catching on ground throughout test 12/05/23: 818 ft with SPC and new [employer] shoes Goal status: Partially Met   4.  Patient will return to nightly ambulation's with less than five foot scuffs per ambulation to return to PLOF.  Baseline: 1/28: limited ambulation  4/14: some limitations pending on day. Encouraged to perform multiple short walks  6/11: started doing nightly walks ; decreased foot scuffs   7/14: pt reports no scuffs Goal status: MET  5. Patient will increase ABC scale score >80% to demonstrate better functional mobility and better confidence with ADLs.  Baseline: 1/28: 60%   4/14: 65%   6/11: 68%  7/14: 59.38% 12/05/23: 56.88% Goal status: Partially Met  6.  Pt will complete 30 minutes of low-impact cardio exercise (such as stationary cycling) 3 times per week for the next 6 weeks, gradually increasing the duration by 5 minutes every 2 weeks, to improve my cardiovascular endurance while monitoring for signs of fatigue or overheating. Baseline: 10/22/23: pt able to achieve 1 bout of 20 min in a week 12/05/23: Pt  able to achieve 2 bouts of 25 minutes last week.   Goal Status: NEW   ASSESSMENT:  CLINICAL IMPRESSION:    Pt responded well to the exercises and was fatigued at the end of the session.  Pt noted to hve increased difficulty with the dorsiflexion of the L LE when becoming more fatigued with ambulation.  Pt encouraged to continue to perform exercises at home and biking in order to challenge the dorsiflexion of the L LE.  Pt otherwise is doing well.   Pt will continue to benefit from skilled therapy to address remaining deficits in order to improve overall QoL and return to PLOF.       OBJECTIVE IMPAIRMENTS: Abnormal gait, decreased activity tolerance, decreased balance, decreased coordination, decreased endurance, decreased mobility, difficulty walking, decreased strength, impaired perceived functional ability, impaired flexibility, impaired UE functional use, and improper body mechanics.   ACTIVITY LIMITATIONS: carrying, lifting, bending, sitting, standing, squatting, stairs, transfers, bed mobility, dressing, reach over head, hygiene/grooming, locomotion level, and caring for others  PARTICIPATION LIMITATIONS: meal prep, cleaning, laundry, interpersonal relationship, driving, shopping, community activity, and occupation  PERSONAL FACTORS: Age, Fitness, Past/current experiences, Profession, Time since onset of injury/illness/exacerbation, and 1-2 comorbidities: MS anxiety are also affecting patient's functional outcome.   REHAB POTENTIAL: Good  CLINICAL DECISION MAKING: Evolving/moderate complexity  EVALUATION COMPLEXITY: Moderate  PLAN:  PT FREQUENCY: 2x/week  PT DURATION: 12 weeks  PLANNED INTERVENTIONS: 97164- PT Re-evaluation, 97110-Therapeutic exercises, 97530- Therapeutic activity, 97112- Neuromuscular re-education, 97535- Self Care, [id number]- Manual therapy, [phone]- Gait training, [phone]- Orthotic Fit/training, [phone]- Canalith repositioning, Patient/Family education, Balance  training, Stair training, Taping, Dry Needling, Joint mobilization, Joint manipulation, Spinal mobilization, Vestibular training, Visual/preceptual remediation/compensation, DME instructions, Cryotherapy, and Moist heat  PLAN FOR NEXT SESSION:    airex pad/balance, LLE strengthening, HEP , LLE coordination. Adding cognitive/manual dual task with dynamic balance activities     Fonda Simpers, PT, DPT Physical Therapist - [facility]  12/10/23, 2:39 PM

## 2023-12-12 ENCOUNTER — Ambulatory Visit

## 2023-12-12 DIAGNOSIS — R262 Difficulty in walking, not elsewhere classified: Secondary | ICD-10-CM | POA: Diagnosis not present

## 2023-12-12 DIAGNOSIS — R269 Unspecified abnormalities of gait and mobility: Secondary | ICD-10-CM

## 2023-12-12 DIAGNOSIS — M6281 Muscle weakness (generalized): Secondary | ICD-10-CM

## 2023-12-12 DIAGNOSIS — Z9181 History of falling: Secondary | ICD-10-CM

## 2023-12-12 DIAGNOSIS — M25621 Stiffness of right elbow, not elsewhere classified: Secondary | ICD-10-CM

## 2023-12-12 DIAGNOSIS — M25521 Pain in right elbow: Secondary | ICD-10-CM

## 2023-12-12 DIAGNOSIS — R2681 Unsteadiness on feet: Secondary | ICD-10-CM

## 2023-12-12 NOTE — Therapy (Signed)
 OUTPATIENT PHYSICAL THERAPY NEURO TREATMENT  Patient Name: Michelle Ruiz MRN: [id number] DOB:January 29, 1971, 53 y.o., female Today's Date: 12/12/2023   PCP: Sherial Bail  REFERRING PROVIDER: Maree Hila K  END OF SESSION:   PT End of Session - 12/12/23 1321     Visit Number 42    Number of Visits 51    Date for PT Re-Evaluation 12/31/23    Progress Note Due on Visit 70    PT Start Time 1318    PT Stop Time 1400    PT Time Calculation (min) 42 min    Equipment Utilized During Treatment Gait belt    Activity Tolerance Patient tolerated treatment well    Behavior During Therapy WFL for tasks assessed/performed         Past Medical History:  Diagnosis Date   Abnormal Pap smear of cervix    Actinic keratosis 09/17/2023   R spinal mid upper back   Anxiety    Basal cell carcinoma 04/14/2008   Right upper ant. thigh. BCC with sclerosis.   Basal cell carcinoma 08/14/2008   Right lat. lower thigh. Superficial.    Basal cell carcinoma 04/29/2019   Right mid forearm. Superficial and nodular patterns. EDC.   Basal cell carcinoma 08/17/2020   Right upper antecubital. EDC.   Basal cell carcinoma 08/23/2021   Left Lower Leg, EDC   Basal cell carcinoma 09/17/2023   L medial lower leg, EDC   Kidney stones    MS (multiple sclerosis) (HCC)    UTI (urinary tract infection)    Past Surgical History:  Procedure Laterality Date   BREAST BIOPSY Right 2010   stereotactic biopsy/ neg/ dr.byrnett   BREAST BIOPSY Left 2012   neg/dr. brynett   CESAREAN SECTION  2008   RPH   TONSILLECTOMY  2006   There are no active problems to display for this patient.   ONSET DATE: diagnosed 12 years ago.   REFERRING DIAG: MS, gait and mobility   THERAPY DIAG:  Difficulty in walking, not elsewhere classified  Abnormality of gait and mobility  Unsteadiness on feet  Muscle weakness (generalized)  History of falling  Stiffness of right elbow, not elsewhere classified  Pain in right  elbow  Rationale for Evaluation and Treatment: Rehabilitation  SUBJECTIVE:                                                                                                                                                                                             SUBJECTIVE STATEMENT:  Pt reports she rode the bike yesterday, as well as Sunday, which makes it 2x this week already.  Pt reports no adverse reactions and is doing well otherwise.     Pt accompanied by: self  PERTINENT HISTORY: Patient was seen by physical therapy until September of last year. Pmh includes MS, UTI, anxiety. Has extreme spasticity in LLE. Extreme cold and heat affect her LLE. Patient is now taking hormonal medication. Patient is a Chartered certified accountant.   PAIN:  Are you having pain? No  PRECAUTIONS: None  RED FLAGS: None   WEIGHT BEARING RESTRICTIONS: No  FALLS: Has patient fallen in last 6 months? No  LIVING ENVIRONMENT: Lives with: lives with their family Lives in: House/apartment Stairs: no stairs to enter, lives on second floor Has following equipment at home: Single point cane and shower chair  PLOF: Independent  PATIENT GOALS: continue walking as long as she can.   OBJECTIVE:  Note: Objective measures were completed at Evaluation unless otherwise noted.  DIAGNOSTIC FINDINGS: n/a  COGNITION: Overall cognitive status: Within functional limits for tasks assessed   SENSATION: WFL; slight decrease to RLE   COORDINATION: Heel slide: limited LLE   MUSCLE TONE: LLE: Mild and Moderate  POSTURE: No Significant postural limitations  LOWER EXTREMITY ROM:     WFL  LOWER EXTREMITY MMT:    MMT Right Eval Left Eval  Hip flexion 6.4 4.2  Hip extension    Hip abduction 7.9 3.4  Hip adduction 7.5 8.5  Hip internal rotation    Hip external rotation    Knee flexion 11.1 10.2  Knee extension 7.8 10  Ankle dorsiflexion 10.8 6.8  Ankle plantarflexion 7.2 6.9  Ankle inversion    Ankle  eversion    (Blank rows = not tested)  BED MOBILITY:  Scooting into bed is very challenging at end of night  TRANSFERS: Assistive device utilized: Single point cane  Sit to stand: Complete Independence Stand to sit: Complete Independence Chair to chair: Complete Independence  STAIRS: Level of Assistance: CGA Stair Negotiation Technique: Alternating Pattern  with Single Rail on Right Number of Stairs: 5  Height of Stairs: 6 inch   GAIT: Gait pattern: step through pattern and poor foot clearance- Left Distance walked: 60 ft Assistive device utilized: None Level of assistance: CGA Comments: L foot scuffing  FUNCTIONAL TESTS:  5 times sit to stand: 13 seconds with arms crossed.  6 minute walk test: next session  Functional gait assessment: 14/30  PATIENT SURVEYS:  ABC scale 60.5%                                                                                                                               TREATMENT DATE: 12/12/23    Gait belt donned with CGA for all standing activities, thorough therapeutic rest breaks provided throughout session.   TherAct: To improve functional movements patterns for everyday tasks  The patient completed 6 minutes on the Spin Bike using both BLE movements to promote strength, endurance, and cardiorespiratory fitness. PT increased the resistance level and monitored the  patient's response to the intervention throughout. The patient required min cueing for technique and supervision/minA level of assistance for getting foot placed in the holster.   Aura carries with 15# KB, laps in the [place] open area, x4 alternating arm Briefcase carries with 15# KB, laps in the [place] open area, x3 total laps  Standing SLS with 15# KB holds on each side, ~5 seconds each side multiple attempts to perform for longer    Gait:  Forward ambulation around the [place] group area for improved gait mechanics, which involved stepping over cane, x4 total  laps without AD  Retro ambulation around the [employer] group area for improved gait mechanics, which involved stepping over cane, x3 total laps without AD     PATIENT EDUCATION: Education details: Exercise technique; importance of daily stretching Person educated: Patient Education method: Explanation, Demonstration, Tactile cues, and Verbal cues Education comprehension: verbalized understanding, returned demonstration, verbal cues required, and tactile cues required   HOME EXERCISE PROGRAM: Access Code: [email or web address] URL: [email or web address] Date: 08/13/2023 Prepared by: Reyes London  Exercises - Hooklying Single Knee to Chest Stretch  - 1-2 x daily - 3 sets - 30 sec  hold - Supine Lower Trunk Rotation  - 1-2 x daily - 3 sets - 30 sec hold - Supine Hamstring Stretch with Strap  - 1-2 x daily - 3 sets - 30 sec hold - Seated Hamstring Stretch  - 1-2 x daily - 3 sets - 30 sec hold - Supine Hip Internal and External Rotation  - 1-2 x daily - 3 sets - 10 reps - Seated Hip External Rotation Stretch  - 1 x daily - 3 sets - 30 sec hold - Supine Figure 4 Piriformis Stretch  - 1 x daily - 3 sets - 30 sec hold   Access Code: [email or web address] URL: [email or web address] Date: 05/29/2023 Prepared by: Marina  Moser  Exercises - Towel Scrunches  - 1 x daily - 7 x weekly - 2 sets - 10 reps - 5 hold - Toe Spreading  - 1 x daily - 7 x weekly - 2 sets - 10 reps - 5 hold - Seated Ankle Alphabet  - 1 x daily - 7 x weekly - 2 sets - 1 reps - 5 hold    GOALS:  Goals reviewed with patient? Yes  SHORT TERM GOALS: Target date: 05/22/2023  Patient will be independent in home exercise program to improve strength/mobility for better functional independence with ADLs. Baseline: 4/14: compliant  Goal status: MET    LONG TERM GOALS: Target date: 12/31/2023  Patient will increase Functional Gait Assessment score to >25/30 as to reduce fall risk and improve dynamic gait safety  with community ambulation. Baseline: 1/28: 14/30   4/14: 17/30   6/11: 18/30  7/14: 20/30 12/05/23:  Goal status: Partially Met  2.   Patient (< 68 years old) will complete five times sit to stand test in < 10 seconds indicating an increased LE strength and improved balance. Baseline: 1/28: 13 seconds  4/14: 10.72 seconds no hands  6/11: 10 seconds no hands  12/05/23: 14.44 sec no hands Goal status: Re-Assessed  3.  Patient will increase six minute walk test distance to >1000 for progression to community ambulator and improve gait ability Baseline: Perform next session 3/4: 820 ft  4/14: 890 ft with [facility]   6/11: 888 ft with [facility]  7/14: 870 ft with SPC- only one instance of L foot catching on ground throughout test 12/05/23: 818 ft with [facility] and new  [employer] shoes Goal status: Partially Met   4.  Patient will return to nightly ambulation's with less than five foot scuffs per ambulation to return to PLOF.  Baseline: 1/28: limited ambulation  4/14: some limitations pending on day. Encouraged to perform multiple short walks  6/11: started doing nightly walks ; decreased foot scuffs   7/14: pt reports no scuffs Goal status: MET  5. Patient will increase ABC scale score >80% to demonstrate better functional mobility and better confidence with ADLs.  Baseline: 1/28: 60%   4/14: 65%   6/11: 68%  7/14: 59.38% 12/05/23: 56.88% Goal status: Partially Met  6.  Pt will complete 30 minutes of low-impact cardio exercise (such as stationary cycling) 3 times per week for the next 6 weeks, gradually increasing the duration by 5 minutes every 2 weeks, to improve my cardiovascular endurance while monitoring for signs of fatigue or overheating. Baseline: 10/22/23: pt able to achieve 1 bout of 20 min in a week 12/05/23: Pt able to achieve 2 bouts of 25 minutes last week.   Goal Status: NEW   ASSESSMENT:  CLINICAL IMPRESSION:    Pt responded well to the exercises and put forth good effort throughout  the session.  Pt was introduced to the spin bike and performed well with the adjusted difficulty as well.  Pt ultimately was able to challenge balance by performing single leg stance with the KB as well and having to step over objects during gait training.  Pt ultimately continues to need to improve overall exercise tolerance and balance when ambulating.   Pt will continue to benefit from skilled therapy to address remaining deficits in order to improve overall QoL and return to PLOF.        OBJECTIVE IMPAIRMENTS: Abnormal gait, decreased activity tolerance, decreased balance, decreased coordination, decreased endurance, decreased mobility, difficulty walking, decreased strength, impaired perceived functional ability, impaired flexibility, impaired UE functional use, and improper body mechanics.   ACTIVITY LIMITATIONS: carrying, lifting, bending, sitting, standing, squatting, stairs, transfers, bed mobility, dressing, reach over head, hygiene/grooming, locomotion level, and caring for others  PARTICIPATION LIMITATIONS: meal prep, cleaning, laundry, interpersonal relationship, driving, shopping, community activity, and occupation  PERSONAL FACTORS: Age, Fitness, Past/current experiences, Profession, Time since onset of injury/illness/exacerbation, and 1-2 comorbidities: MS anxiety are also affecting patient's functional outcome.   REHAB POTENTIAL: Good  CLINICAL DECISION MAKING: Evolving/moderate complexity  EVALUATION COMPLEXITY: Moderate  PLAN:  PT FREQUENCY: 2x/week  PT DURATION: 12 weeks  PLANNED INTERVENTIONS: 97164- PT Re-evaluation, 97110-Therapeutic exercises, 97530- Therapeutic activity, 97112- Neuromuscular re-education, 97535- Self Care, 02859- Manual therapy, [phone]- Gait training, [phone]- Orthotic Fit/training, [phone]- Canalith repositioning, Patient/Family education, Balance training, Stair training, Taping, Dry Needling, Joint mobilization, Joint manipulation, Spinal  mobilization, Vestibular training, Visual/preceptual remediation/compensation, DME instructions, Cryotherapy, and Moist heat  PLAN FOR NEXT SESSION:    airex pad/balance, LLE strengthening, HEP , LLE coordination. Adding cognitive/manual dual task with dynamic balance activities     Fonda Simpers, PT, DPT Physical Therapist - [facility]  12/12/23, 4:02 PM

## 2023-12-17 ENCOUNTER — Ambulatory Visit

## 2023-12-17 NOTE — Therapy (Incomplete)
 OUTPATIENT PHYSICAL THERAPY NEURO TREATMENT  Patient Name: Michelle Ruiz MRN: [id number] DOB:04/19/1970, 53 y.o., female Today's Date: 12/17/2023   PCP: Sherial Bail  REFERRING PROVIDER: Maree Hila K  END OF SESSION:    Past Medical History:  Diagnosis Date   Abnormal Pap smear of cervix    Actinic keratosis 09/17/2023   R spinal mid upper back   Anxiety    Basal cell carcinoma 04/14/2008   Right upper ant. thigh. BCC with sclerosis.   Basal cell carcinoma 08/14/2008   Right lat. lower thigh. Superficial.    Basal cell carcinoma 04/29/2019   Right mid forearm. Superficial and nodular patterns. EDC.   Basal cell carcinoma 08/17/2020   Right upper antecubital. EDC.   Basal cell carcinoma 08/23/2021   Left Lower Leg, EDC   Basal cell carcinoma 09/17/2023   L medial lower leg, EDC   Kidney stones    MS (multiple sclerosis) (HCC)    UTI (urinary tract infection)    Past Surgical History:  Procedure Laterality Date   BREAST BIOPSY Right 2010   stereotactic biopsy/ neg/ dr.byrnett   BREAST BIOPSY Left 2012   neg/dr. brynett   CESAREAN SECTION  2008   RPH   TONSILLECTOMY  2006   There are no active problems to display for this patient.   ONSET DATE: diagnosed 12 years ago.   REFERRING DIAG: MS, gait and mobility   THERAPY DIAG:  No diagnosis found.  Rationale for Evaluation and Treatment: Rehabilitation  SUBJECTIVE:                                                                                                                                                                                             SUBJECTIVE STATEMENT:  ***   Pt accompanied by: self  PERTINENT HISTORY: Patient was seen by physical therapy until September of last year. Pmh includes MS, UTI, anxiety. Has extreme spasticity in LLE. Extreme cold and heat affect her LLE. Patient is now taking hormonal medication. Patient is a Chartered certified accountant.   PAIN:  Are you having pain?  No  PRECAUTIONS: None  RED FLAGS: None   WEIGHT BEARING RESTRICTIONS: No  FALLS: Has patient fallen in last 6 months? No  LIVING ENVIRONMENT: Lives with: lives with their family Lives in: House/apartment Stairs: no stairs to enter, lives on second floor Has following equipment at home: Single point cane and shower chair  PLOF: Independent  PATIENT GOALS: continue walking as long as she can.   OBJECTIVE:  Note: Objective measures were completed at Evaluation unless otherwise noted.  DIAGNOSTIC FINDINGS: n/a  COGNITION: Overall cognitive status:  Within functional limits for tasks assessed   SENSATION: WFL; slight decrease to RLE   COORDINATION: Heel slide: limited LLE   MUSCLE TONE: LLE: Mild and Moderate  POSTURE: No Significant postural limitations  LOWER EXTREMITY ROM:     WFL  LOWER EXTREMITY MMT:    MMT Right Eval Left Eval  Hip flexion 6.4 4.2  Hip extension    Hip abduction 7.9 3.4  Hip adduction 7.5 8.5  Hip internal rotation    Hip external rotation    Knee flexion 11.1 10.2  Knee extension 7.8 10  Ankle dorsiflexion 10.8 6.8  Ankle plantarflexion 7.2 6.9  Ankle inversion    Ankle eversion    (Blank rows = not tested)  BED MOBILITY:  Scooting into bed is very challenging at end of night  TRANSFERS: Assistive device utilized: Single point cane  Sit to stand: Complete Independence Stand to sit: Complete Independence Chair to chair: Complete Independence  STAIRS: Level of Assistance: CGA Stair Negotiation Technique: Alternating Pattern  with Single Rail on Right Number of Stairs: 5  Height of Stairs: 6 inch   GAIT: Gait pattern: step through pattern and poor foot clearance- Left Distance walked: 60 ft Assistive device utilized: None Level of assistance: CGA Comments: L foot scuffing  FUNCTIONAL TESTS:  5 times sit to stand: 13 seconds with arms crossed.  6 minute walk test: next session  Functional gait assessment:  14/30  PATIENT SURVEYS:  ABC scale 60.5%                                                                                                                               TREATMENT DATE: 12/17/23   ***  Gait belt donned with CGA for all standing activities, thorough therapeutic rest breaks provided throughout session.   TherAct: To improve functional movements patterns for everyday tasks  The patient completed 6 minutes on the Spin Bike using both BLE movements to promote strength, endurance, and cardiorespiratory fitness. PT increased the resistance level and monitored the patient's response to the intervention throughout. The patient required min cueing for technique and supervision/minA level of assistance for getting foot placed in the holster.   Aura carries with 15# KB, laps in the [place] open area, x4 alternating arm Briefcase carries with 15# KB, laps in the [place] open area, x3 total laps  Standing SLS with 15# KB holds on each side, ~5 seconds each side multiple attempts to perform for longer    Gait:  Forward ambulation around the [place] group area for improved gait mechanics, which involved stepping over cane, x4 total laps without AD  Retro ambulation around the [employer] group area for improved gait mechanics, which involved stepping over cane, x3 total laps without AD     PATIENT EDUCATION: Education details: Exercise technique; importance of daily stretching Person educated: Patient Education method: Explanation, Demonstration, Tactile cues, and Verbal cues Education comprehension: verbalized understanding, returned demonstration, verbal cues required, and  tactile cues required   HOME EXERCISE PROGRAM: Access Code: [email or web address] URL: [email or web address] Date: 08/13/2023 Prepared by: Reyes London  Exercises - Hooklying Single Knee to Chest Stretch  - 1-2 x daily - 3 sets - 30 sec  hold - Supine Lower Trunk Rotation  - 1-2 x daily - 3  sets - 30 sec hold - Supine Hamstring Stretch with Strap  - 1-2 x daily - 3 sets - 30 sec hold - Seated Hamstring Stretch  - 1-2 x daily - 3 sets - 30 sec hold - Supine Hip Internal and External Rotation  - 1-2 x daily - 3 sets - 10 reps - Seated Hip External Rotation Stretch  - 1 x daily - 3 sets - 30 sec hold - Supine Figure 4 Piriformis Stretch  - 1 x daily - 3 sets - 30 sec hold   Access Code: [email or web address] URL: [email or web address] Date: 05/29/2023 Prepared by: Marina  Moser  Exercises - Towel Scrunches  - 1 x daily - 7 x weekly - 2 sets - 10 reps - 5 hold - Toe Spreading  - 1 x daily - 7 x weekly - 2 sets - 10 reps - 5 hold - Seated Ankle Alphabet  - 1 x daily - 7 x weekly - 2 sets - 1 reps - 5 hold    GOALS:  Goals reviewed with patient? Yes  SHORT TERM GOALS: Target date: 05/22/2023  Patient will be independent in home exercise program to improve strength/mobility for better functional independence with ADLs. Baseline: 4/14: compliant  Goal status: MET    LONG TERM GOALS: Target date: 12/31/2023  Patient will increase Functional Gait Assessment score to >25/30 as to reduce fall risk and improve dynamic gait safety with community ambulation. Baseline: 1/28: 14/30   4/14: 17/30   6/11: 18/30  7/14: 20/30 12/05/23:  Goal status: Partially Met  2.   Patient (< 68 years old) will complete five times sit to stand test in < 10 seconds indicating an increased LE strength and improved balance. Baseline: 1/28: 13 seconds  4/14: 10.72 seconds no hands  6/11: 10 seconds no hands  12/05/23: 14.44 sec no hands Goal status: Re-Assessed  3.  Patient will increase six minute walk test distance to >1000 for progression to community ambulator and improve gait ability Baseline: Perform next session 3/4: 820 ft  4/14: 890 ft with [facility]   6/11: 888 ft with [facility]  7/14: 870 ft with SPC- only one instance of L foot catching on ground throughout test 12/05/23: 818 ft with SPC and  new [employer] shoes Goal status: Partially Met   4.  Patient will return to nightly ambulation's with less than five foot scuffs per ambulation to return to PLOF.  Baseline: 1/28: limited ambulation  4/14: some limitations pending on day. Encouraged to perform multiple short walks  6/11: started doing nightly walks ; decreased foot scuffs   7/14: pt reports no scuffs Goal status: MET  5. Patient will increase ABC scale score >80% to demonstrate better functional mobility and better confidence with ADLs.  Baseline: 1/28: 60%   4/14: 65%   6/11: 68%  7/14: 59.38% 12/05/23: 56.88% Goal status: Partially Met  6.  Pt will complete 30 minutes of low-impact cardio exercise (such as stationary cycling) 3 times per week for the next 6 weeks, gradually increasing the duration by 5 minutes every 2 weeks, to improve my cardiovascular endurance while monitoring for signs of fatigue or overheating. Baseline: 10/22/23: pt able to  achieve 1 bout of 20 min in a week 12/05/23: Pt able to achieve 2 bouts of 25 minutes last week.   Goal Status: NEW   ASSESSMENT:  CLINICAL IMPRESSION:    ***      OBJECTIVE IMPAIRMENTS: Abnormal gait, decreased activity tolerance, decreased balance, decreased coordination, decreased endurance, decreased mobility, difficulty walking, decreased strength, impaired perceived functional ability, impaired flexibility, impaired UE functional use, and improper body mechanics.   ACTIVITY LIMITATIONS: carrying, lifting, bending, sitting, standing, squatting, stairs, transfers, bed mobility, dressing, reach over head, hygiene/grooming, locomotion level, and caring for others  PARTICIPATION LIMITATIONS: meal prep, cleaning, laundry, interpersonal relationship, driving, shopping, community activity, and occupation  PERSONAL FACTORS: Age, Fitness, Past/current experiences, Profession, Time since onset of injury/illness/exacerbation, and 1-2 comorbidities: MS anxiety are also  affecting patient's functional outcome.   REHAB POTENTIAL: Good  CLINICAL DECISION MAKING: Evolving/moderate complexity  EVALUATION COMPLEXITY: Moderate  PLAN:  PT FREQUENCY: 2x/week  PT DURATION: 12 weeks  PLANNED INTERVENTIONS: 97164- PT Re-evaluation, 97110-Therapeutic exercises, 97530- Therapeutic activity, 97112- Neuromuscular re-education, 97535- Self Care, 02859- Manual therapy, [phone]- Gait training, [phone]- Orthotic Fit/training, [phone]- Canalith repositioning, Patient/Family education, Balance training, Stair training, Taping, Dry Needling, Joint mobilization, Joint manipulation, Spinal mobilization, Vestibular training, Visual/preceptual remediation/compensation, DME instructions, Cryotherapy, and Moist heat  PLAN FOR NEXT SESSION:   *** airex pad/balance, LLE strengthening, HEP , LLE coordination. Adding cognitive/manual dual task with dynamic balance activities     Fonda Simpers, PT, DPT Physical Therapist - [facility]  [facility]  12/17/23, 7:57 AM

## 2023-12-19 ENCOUNTER — Ambulatory Visit

## 2023-12-19 DIAGNOSIS — R262 Difficulty in walking, not elsewhere classified: Secondary | ICD-10-CM | POA: Diagnosis not present

## 2023-12-19 DIAGNOSIS — M25521 Pain in right elbow: Secondary | ICD-10-CM

## 2023-12-19 DIAGNOSIS — Z9181 History of falling: Secondary | ICD-10-CM

## 2023-12-19 DIAGNOSIS — R2681 Unsteadiness on feet: Secondary | ICD-10-CM

## 2023-12-19 DIAGNOSIS — M6281 Muscle weakness (generalized): Secondary | ICD-10-CM

## 2023-12-19 DIAGNOSIS — R269 Unspecified abnormalities of gait and mobility: Secondary | ICD-10-CM

## 2023-12-19 DIAGNOSIS — M25621 Stiffness of right elbow, not elsewhere classified: Secondary | ICD-10-CM

## 2023-12-19 NOTE — Therapy (Signed)
 OUTPATIENT PHYSICAL THERAPY NEURO TREATMENT  Patient Name: Michelle Ruiz MRN: [id number] DOB:Aug 19, 1970, 53 y.o., female Today's Date: 12/19/2023   PCP: Sherial Bail  REFERRING PROVIDER: Maree Hila K  END OF SESSION:   PT End of Session - 12/19/23 1321     Visit Number 43    Number of Visits 51    Date for Recertification  12/31/23    Progress Note Due on Visit 70    PT Start Time 1316    PT Stop Time 1400    PT Time Calculation (min) 44 min    Equipment Utilized During Treatment Gait belt    Activity Tolerance Patient tolerated treatment well    Behavior During Therapy WFL for tasks assessed/performed         Past Medical History:  Diagnosis Date   Abnormal Pap smear of cervix    Actinic keratosis 09/17/2023   R spinal mid upper back   Anxiety    Basal cell carcinoma 04/14/2008   Right upper ant. thigh. BCC with sclerosis.   Basal cell carcinoma 08/14/2008   Right lat. lower thigh. Superficial.    Basal cell carcinoma 04/29/2019   Right mid forearm. Superficial and nodular patterns. EDC.   Basal cell carcinoma 08/17/2020   Right upper antecubital. EDC.   Basal cell carcinoma 08/23/2021   Left Lower Leg, EDC   Basal cell carcinoma 09/17/2023   L medial lower leg, EDC   Kidney stones    MS (multiple sclerosis)    UTI (urinary tract infection)    Past Surgical History:  Procedure Laterality Date   BREAST BIOPSY Right 2010   stereotactic biopsy/ neg/ dr.byrnett   BREAST BIOPSY Left 2012   neg/dr. brynett   CESAREAN SECTION  2008   RPH   TONSILLECTOMY  2006   There are no active problems to display for this patient.   ONSET DATE: diagnosed 12 years ago.   REFERRING DIAG: MS, gait and mobility   THERAPY DIAG:  Difficulty in walking, not elsewhere classified  Abnormality of gait and mobility  Unsteadiness on feet  Muscle weakness (generalized)  History of falling  Stiffness of right elbow, not elsewhere classified  Pain in right  elbow  Rationale for Evaluation and Treatment: Rehabilitation  SUBJECTIVE:                                                                                                                                                                                             SUBJECTIVE STATEMENT:  Pt reports that she had a panic attack from Tuesday and the reason why she was unable to come to  the clinic.  Pt reports that she is doing better.   Pt accompanied by: self  PERTINENT HISTORY: Patient was seen by physical therapy until September of last year. Pmh includes MS, UTI, anxiety. Has extreme spasticity in LLE. Extreme cold and heat affect her LLE. Patient is now taking hormonal medication. Patient is a Chartered certified accountant.   PAIN:  Are you having pain? No  PRECAUTIONS: None  RED FLAGS: None   WEIGHT BEARING RESTRICTIONS: No  FALLS: Has patient fallen in last 6 months? No  LIVING ENVIRONMENT: Lives with: lives with their family Lives in: House/apartment Stairs: no stairs to enter, lives on second floor Has following equipment at home: Single point cane and shower chair  PLOF: Independent  PATIENT GOALS: continue walking as long as she can.   OBJECTIVE:  Note: Objective measures were completed at Evaluation unless otherwise noted.  DIAGNOSTIC FINDINGS: n/a  COGNITION: Overall cognitive status: Within functional limits for tasks assessed   SENSATION: WFL; slight decrease to RLE   COORDINATION: Heel slide: limited LLE   MUSCLE TONE: LLE: Mild and Moderate  POSTURE: No Significant postural limitations  LOWER EXTREMITY ROM:     WFL  LOWER EXTREMITY MMT:    MMT Right Eval Left Eval  Hip flexion 6.4 4.2  Hip extension    Hip abduction 7.9 3.4  Hip adduction 7.5 8.5  Hip internal rotation    Hip external rotation    Knee flexion 11.1 10.2  Knee extension 7.8 10  Ankle dorsiflexion 10.8 6.8  Ankle plantarflexion 7.2 6.9  Ankle inversion    Ankle eversion     (Blank rows = not tested)  BED MOBILITY:  Scooting into bed is very challenging at end of night  TRANSFERS: Assistive device utilized: Single point cane  Sit to stand: Complete Independence Stand to sit: Complete Independence Chair to chair: Complete Independence  STAIRS: Level of Assistance: CGA Stair Negotiation Technique: Alternating Pattern  with Single Rail on Right Number of Stairs: 5  Height of Stairs: 6 inch   GAIT: Gait pattern: step through pattern and poor foot clearance- Left Distance walked: 60 ft Assistive device utilized: None Level of assistance: CGA Comments: L foot scuffing  FUNCTIONAL TESTS:  5 times sit to stand: 13 seconds with arms crossed.  6 minute walk test: next session  Functional gait assessment: 14/30  PATIENT SURVEYS:  ABC scale 60.5%                                                                                                                               TREATMENT DATE: 12/19/23    Gait belt donned with CGA for all standing activities, thorough therapeutic rest breaks provided throughout session.   TherAct: To improve functional movements patterns for everyday tasks  Aura carries with 15# KB, laps around gym, x4 laps total  Therapeutic rest breaks utilized between bouts of ambulation to prevent overheating and too great of fatigue  Briefcase  carries with 15# KB, laps around gym, x4 total laps, alternating arm,   Gait Training:  Ambulation to the cancer center without AD and challenged by verbal cuing for increased step length and improved upward gaze, ~1000'     PATIENT EDUCATION: Education details: Exercise technique; importance of daily stretching Person educated: Patient Education method: Explanation, Demonstration, Tactile cues, and Verbal cues Education comprehension: verbalized understanding, returned demonstration, verbal cues required, and tactile cues required   HOME EXERCISE PROGRAM: Access Code:  [email or web address] URL: [email or web address] Date: 08/13/2023 Prepared by: Reyes London  Exercises - Hooklying Single Knee to Chest Stretch  - 1-2 x daily - 3 sets - 30 sec  hold - Supine Lower Trunk Rotation  - 1-2 x daily - 3 sets - 30 sec hold - Supine Hamstring Stretch with Strap  - 1-2 x daily - 3 sets - 30 sec hold - Seated Hamstring Stretch  - 1-2 x daily - 3 sets - 30 sec hold - Supine Hip Internal and External Rotation  - 1-2 x daily - 3 sets - 10 reps - Seated Hip External Rotation Stretch  - 1 x daily - 3 sets - 30 sec hold - Supine Figure 4 Piriformis Stretch  - 1 x daily - 3 sets - 30 sec hold   Access Code: [email or web address] URL: [email or web address] Date: 05/29/2023 Prepared by: Marina  Moser  Exercises - Towel Scrunches  - 1 x daily - 7 x weekly - 2 sets - 10 reps - 5 hold - Toe Spreading  - 1 x daily - 7 x weekly - 2 sets - 10 reps - 5 hold - Seated Ankle Alphabet  - 1 x daily - 7 x weekly - 2 sets - 1 reps - 5 hold    GOALS:  Goals reviewed with patient? Yes  SHORT TERM GOALS: Target date: 05/22/2023  Patient will be independent in home exercise program to improve strength/mobility for better functional independence with ADLs. Baseline: 4/14: compliant  Goal status: MET    LONG TERM GOALS: Target date: 12/31/2023  Patient will increase Functional Gait Assessment score to >25/30 as to reduce fall risk and improve dynamic gait safety with community ambulation. Baseline: 1/28: 14/30   4/14: 17/30   6/11: 18/30  7/14: 20/30 12/05/23:  Goal status: Partially Met  2.   Patient (< 26 years old) will complete five times sit to stand test in < 10 seconds indicating an increased LE strength and improved balance. Baseline: 1/28: 13 seconds  4/14: 10.72 seconds no hands  6/11: 10 seconds no hands  12/05/23: 14.44 sec no hands Goal status: Re-Assessed  3.  Patient will increase six minute walk test distance to >1000 for progression to community  ambulator and improve gait ability Baseline: Perform next session 3/4: 820 ft  4/14: 890 ft with [facility]   6/11: 888 ft with [facility]  7/14: 870 ft with SPC- only one instance of L foot catching on ground throughout test 12/05/23: 818 ft with SPC and new [employer] shoes Goal status: Partially Met   4.  Patient will return to nightly ambulation's with less than five foot scuffs per ambulation to return to PLOF.  Baseline: 1/28: limited ambulation  4/14: some limitations pending on day. Encouraged to perform multiple short walks  6/11: started doing nightly walks ; decreased foot scuffs   7/14: pt reports no scuffs Goal status: MET  5. Patient will increase ABC scale score >80% to demonstrate better functional mobility and better confidence with ADLs.  Baseline: 1/28: 60%   4/14: 65%   6/11: 68%  7/14: 59.38% 12/05/23: 56.88% Goal status: Partially Met  6.  Pt will complete 30 minutes of low-impact cardio exercise (such as stationary cycling) 3 times per week for the next 6 weeks, gradually increasing the duration by 5 minutes every 2 weeks, to improve my cardiovascular endurance while monitoring for signs of fatigue or overheating. Baseline: 10/22/23: pt able to achieve 1 bout of 20 min in a week 12/05/23: Pt able to achieve 2 bouts of 25 minutes last week.   Goal Status: NEW   ASSESSMENT:  CLINICAL IMPRESSION:    Pt responded well to the exercises although the pt was wearing her flats.  Pt noted to have reduced scuffing with the flats during the session.  Pt was challenged by the amount of ambulation she participated in.  Pt encouraged to continue to perform HEP and to improve overall tolerance to the biking exercises.   Pt will continue to benefit from skilled therapy to address remaining deficits in order to improve overall QoL and return to PLOF.         OBJECTIVE IMPAIRMENTS: Abnormal gait, decreased activity tolerance, decreased balance, decreased coordination, decreased endurance,  decreased mobility, difficulty walking, decreased strength, impaired perceived functional ability, impaired flexibility, impaired UE functional use, and improper body mechanics.   ACTIVITY LIMITATIONS: carrying, lifting, bending, sitting, standing, squatting, stairs, transfers, bed mobility, dressing, reach over head, hygiene/grooming, locomotion level, and caring for others  PARTICIPATION LIMITATIONS: meal prep, cleaning, laundry, interpersonal relationship, driving, shopping, community activity, and occupation  PERSONAL FACTORS: Age, Fitness, Past/current experiences, Profession, Time since onset of injury/illness/exacerbation, and 1-2 comorbidities: MS anxiety are also affecting patient's functional outcome.   REHAB POTENTIAL: Good  CLINICAL DECISION MAKING: Evolving/moderate complexity  EVALUATION COMPLEXITY: Moderate  PLAN:  PT FREQUENCY: 2x/week  PT DURATION: 12 weeks  PLANNED INTERVENTIONS: 97164- PT Re-evaluation, 97110-Therapeutic exercises, 97530- Therapeutic activity, 97112- Neuromuscular re-education, 97535- Self Care, 02859- Manual therapy, [phone]- Gait training, [phone]- Orthotic Fit/training, [phone]- Canalith repositioning, Patient/Family education, Balance training, Stair training, Taping, Dry Needling, Joint mobilization, Joint manipulation, Spinal mobilization, Vestibular training, Visual/preceptual remediation/compensation, DME instructions, Cryotherapy, and Moist heat  PLAN FOR NEXT SESSION:    airex pad/balance, LLE strengthening, HEP , LLE coordination. Adding cognitive/manual dual task with dynamic balance activities     Fonda Simpers, PT, DPT Physical Therapist - [facility]  12/19/23, 5:08 PM

## 2023-12-24 ENCOUNTER — Ambulatory Visit

## 2023-12-24 DIAGNOSIS — M25621 Stiffness of right elbow, not elsewhere classified: Secondary | ICD-10-CM

## 2023-12-24 DIAGNOSIS — R269 Unspecified abnormalities of gait and mobility: Secondary | ICD-10-CM

## 2023-12-24 DIAGNOSIS — M6281 Muscle weakness (generalized): Secondary | ICD-10-CM

## 2023-12-24 DIAGNOSIS — R262 Difficulty in walking, not elsewhere classified: Secondary | ICD-10-CM | POA: Diagnosis not present

## 2023-12-24 DIAGNOSIS — Z9181 History of falling: Secondary | ICD-10-CM

## 2023-12-24 DIAGNOSIS — R2681 Unsteadiness on feet: Secondary | ICD-10-CM

## 2023-12-24 DIAGNOSIS — M25521 Pain in right elbow: Secondary | ICD-10-CM

## 2023-12-24 NOTE — Therapy (Signed)
 OUTPATIENT PHYSICAL THERAPY NEURO TREATMENT  Patient Name: Michelle Ruiz MRN: [id number] DOB:December 28, 1970, 53 y.o., female Today's Date: 12/24/2023   PCP: Sherial Bail  REFERRING PROVIDER: Maree Hila K  END OF SESSION:   PT End of Session - 12/24/23 1323     Visit Number 44    Number of Visits 51    Date for Recertification  12/31/23    Progress Note Due on Visit 70    PT Start Time 1318    PT Stop Time 1400    PT Time Calculation (min) 42 min    Equipment Utilized During Treatment Gait belt    Activity Tolerance Patient tolerated treatment well    Behavior During Therapy WFL for tasks assessed/performed         Past Medical History:  Diagnosis Date   Abnormal Pap smear of cervix    Actinic keratosis 09/17/2023   R spinal mid upper back   Anxiety    Basal cell carcinoma 04/14/2008   Right upper ant. thigh. BCC with sclerosis.   Basal cell carcinoma 08/14/2008   Right lat. lower thigh. Superficial.    Basal cell carcinoma 04/29/2019   Right mid forearm. Superficial and nodular patterns. EDC.   Basal cell carcinoma 08/17/2020   Right upper antecubital. EDC.   Basal cell carcinoma 08/23/2021   Left Lower Leg, EDC   Basal cell carcinoma 09/17/2023   L medial lower leg, EDC   Kidney stones    MS (multiple sclerosis)    UTI (urinary tract infection)    Past Surgical History:  Procedure Laterality Date   BREAST BIOPSY Right 2010   stereotactic biopsy/ neg/ dr.byrnett   BREAST BIOPSY Left 2012   neg/dr. brynett   CESAREAN SECTION  2008   RPH   TONSILLECTOMY  2006   There are no active problems to display for this patient.  ONSET DATE: diagnosed 12 years ago.   REFERRING DIAG: MS, gait and mobility   THERAPY DIAG:  Difficulty in walking, not elsewhere classified  Abnormality of gait and mobility  Unsteadiness on feet  Muscle weakness (generalized)  History of falling  Stiffness of right elbow, not elsewhere classified  Pain in right  elbow  Rationale for Evaluation and Treatment: Rehabilitation  SUBJECTIVE:                                                                                                                                                                                             SUBJECTIVE STATEMENT:  Pt reports she had an uneventful weekend.  Pt was able to bike and she cleaned carpet and baked cookies for  her parents.   Pt accompanied by: self  PERTINENT HISTORY: Patient was seen by physical therapy until September of last year. Pmh includes MS, UTI, anxiety. Has extreme spasticity in LLE. Extreme cold and heat affect her LLE. Patient is now taking hormonal medication. Patient is a Chartered certified accountant.   PAIN:  Are you having pain? No  PRECAUTIONS: None  RED FLAGS: None   WEIGHT BEARING RESTRICTIONS: No  FALLS: Has patient fallen in last 6 months? No  LIVING ENVIRONMENT: Lives with: lives with their family Lives in: House/apartment Stairs: no stairs to enter, lives on second floor Has following equipment at home: Single point cane and shower chair  PLOF: Independent  PATIENT GOALS: continue walking as long as she can.    OBJECTIVE:  Note: Objective measures were completed at Evaluation unless otherwise noted.  DIAGNOSTIC FINDINGS: n/a  COGNITION: Overall cognitive status: Within functional limits for tasks assessed   SENSATION: WFL; slight decrease to RLE   COORDINATION: Heel slide: limited LLE   MUSCLE TONE: LLE: Mild and Moderate  POSTURE: No Significant postural limitations  LOWER EXTREMITY ROM:     WFL  LOWER EXTREMITY MMT:    MMT Right Eval Left Eval  Hip flexion 6.4 4.2  Hip extension    Hip abduction 7.9 3.4  Hip adduction 7.5 8.5  Hip internal rotation    Hip external rotation    Knee flexion 11.1 10.2  Knee extension 7.8 10  Ankle dorsiflexion 10.8 6.8  Ankle plantarflexion 7.2 6.9  Ankle inversion    Ankle eversion    (Blank rows = not  tested)  BED MOBILITY:  Scooting into bed is very challenging at end of night  TRANSFERS: Assistive device utilized: Single point cane  Sit to stand: Complete Independence Stand to sit: Complete Independence Chair to chair: Complete Independence  STAIRS: Level of Assistance: CGA Stair Negotiation Technique: Alternating Pattern  with Single Rail on Right Number of Stairs: 5  Height of Stairs: 6 inch   GAIT: Gait pattern: step through pattern and poor foot clearance- Left Distance walked: 60 ft Assistive device utilized: None Level of assistance: CGA Comments: L foot scuffing  FUNCTIONAL TESTS:  5 times sit to stand: 13 seconds with arms crossed.  6 minute walk test: next session  Functional gait assessment: 14/30  PATIENT SURVEYS:  ABC scale 60.5%                                                                                                                               TREATMENT DATE: 12/24/23   Gait belt donned with CGA for all standing activities, thorough therapeutic rest breaks provided throughout session.   Gait Training:  Ambulation to the cancer center without AD and 2# AW, with cues for improved upward gaze, ~1000'.  Pt also encouraged to perform increased dorsiflexion to improve heel to toe, which pt states makes her more unsteady, but is able to perform.   Neuro:  Static stance on Airex pad, 30 sec bouts Static stance on Airex pad, eyes closed 30 sec bouts Static NBOS on Airex pad, 30 sec bouts Static NBOS on Airex pad, eyes closed, 30 sec bouts Static staggered stance on Airex pad, 30 sec bouts Static staggered stance on Airex pad, eyes closed, 30     PATIENT EDUCATION: Education details: Exercise technique; importance of daily stretching Person educated: Patient Education method: Explanation, Demonstration, Tactile cues, and Verbal cues Education comprehension: verbalized understanding, returned demonstration, verbal cues required, and tactile  cues required   HOME EXERCISE PROGRAM: Access Code: [email or web address] URL: [email or web address] Date: 08/13/2023 Prepared by: Reyes London  Exercises - Hooklying Single Knee to Chest Stretch  - 1-2 x daily - 3 sets - 30 sec  hold - Supine Lower Trunk Rotation  - 1-2 x daily - 3 sets - 30 sec hold - Supine Hamstring Stretch with Strap  - 1-2 x daily - 3 sets - 30 sec hold - Seated Hamstring Stretch  - 1-2 x daily - 3 sets - 30 sec hold - Supine Hip Internal and External Rotation  - 1-2 x daily - 3 sets - 10 reps - Seated Hip External Rotation Stretch  - 1 x daily - 3 sets - 30 sec hold - Supine Figure 4 Piriformis Stretch  - 1 x daily - 3 sets - 30 sec hold   Access Code: [email or web address] URL: [email or web address] Date: 05/29/2023 Prepared by: Marina  Moser  Exercises - Towel Scrunches  - 1 x daily - 7 x weekly - 2 sets - 10 reps - 5 hold - Toe Spreading  - 1 x daily - 7 x weekly - 2 sets - 10 reps - 5 hold - Seated Ankle Alphabet  - 1 x daily - 7 x weekly - 2 sets - 1 reps - 5 hold    GOALS:  Goals reviewed with patient? Yes  SHORT TERM GOALS: Target date: 05/22/2023  Patient will be independent in home exercise program to improve strength/mobility for better functional independence with ADLs. Baseline: 4/14: compliant  Goal status: MET    LONG TERM GOALS: Target date: 12/31/2023  Patient will increase Functional Gait Assessment score to >25/30 as to reduce fall risk and improve dynamic gait safety with community ambulation. Baseline: 1/28: 14/30   4/14: 17/30   6/11: 18/30  7/14: 20/30 12/05/23:  Goal status: Partially Met  2.   Patient (< 88 years old) will complete five times sit to stand test in < 10 seconds indicating an increased LE strength and improved balance. Baseline: 1/28: 13 seconds  4/14: 10.72 seconds no hands  6/11: 10 seconds no hands  12/05/23: 14.44 sec no hands Goal status: Re-Assessed  3.  Patient will increase six minute  walk test distance to >1000 for progression to community ambulator and improve gait ability Baseline: Perform next session 3/4: 820 ft  4/14: 890 ft with [facility]   6/11: 888 ft with [facility]  7/14: 870 ft with SPC- only one instance of L foot catching on ground throughout test 12/05/23: 818 ft with SPC and new Cadense shoes Goal status: Partially Met   4.  Patient will return to nightly ambulation's with less than five foot scuffs per ambulation to return to PLOF.  Baseline: 1/28: limited ambulation  4/14: some limitations pending on day. Encouraged to perform multiple short walks  6/11: started doing nightly walks ; decreased foot scuffs   7/14: pt reports no scuffs Goal status: MET  5. Patient  will increase ABC scale score >80% to demonstrate better functional mobility and better confidence with ADLs.  Baseline: 1/28: 60%   4/14: 65%   6/11: 68%  7/14: 59.38% 12/05/23: 56.88% Goal status: Partially Met  6.  Pt will complete 30 minutes of low-impact cardio exercise (such as stationary cycling) 3 times per week for the next 6 weeks, gradually increasing the duration by 5 minutes every 2 weeks, to improve my cardiovascular endurance while monitoring for signs of fatigue or overheating. Baseline: 10/22/23: pt able to achieve 1 bout of 20 min in a week 12/05/23: Pt able to achieve 2 bouts of 25 minutes last week.   Goal Status: NEW   ASSESSMENT:  CLINICAL IMPRESSION:    Pt responded well to the exercises, however was much more unsteady with ambulation attempt than normal.  That being said, pt was able to demonstrate improved gait mechanics with the use of verbal and visual cues.  Pt continues to improve overall endurance levels with ambulation and biking attempts at home.   Pt will continue to benefit from skilled therapy to address remaining deficits in order to improve overall QoL and return to PLOF.       OBJECTIVE IMPAIRMENTS: Abnormal gait, decreased activity tolerance, decreased balance,  decreased coordination, decreased endurance, decreased mobility, difficulty walking, decreased strength, impaired perceived functional ability, impaired flexibility, impaired UE functional use, and improper body mechanics.   ACTIVITY LIMITATIONS: carrying, lifting, bending, sitting, standing, squatting, stairs, transfers, bed mobility, dressing, reach over head, hygiene/grooming, locomotion level, and caring for others  PARTICIPATION LIMITATIONS: meal prep, cleaning, laundry, interpersonal relationship, driving, shopping, community activity, and occupation  PERSONAL FACTORS: Age, Fitness, Past/current experiences, Profession, Time since onset of injury/illness/exacerbation, and 1-2 comorbidities: MS anxiety are also affecting patient's functional outcome.   REHAB POTENTIAL: Good  CLINICAL DECISION MAKING: Evolving/moderate complexity  EVALUATION COMPLEXITY: Moderate  PLAN:  PT FREQUENCY: 2x/week  PT DURATION: 12 weeks  PLANNED INTERVENTIONS: 97164- PT Re-evaluation, 97110-Therapeutic exercises, 97530- Therapeutic activity, 97112- Neuromuscular re-education, 97535- Self Care, 02859- Manual therapy, [phone]- Gait training, [phone]- Orthotic Fit/training, [phone]- Canalith repositioning, Patient/Family education, Balance training, Stair training, Taping, Dry Needling, Joint mobilization, Joint manipulation, Spinal mobilization, Vestibular training, Visual/preceptual remediation/compensation, DME instructions, Cryotherapy, and Moist heat  PLAN FOR NEXT SESSION:    airex pad/balance, LLE strengthening, HEP , LLE coordination. Adding cognitive/manual dual task with dynamic balance activities     Fonda Simpers, PT, DPT Physical Therapist - [facility]  12/24/23, 3:54 PM

## 2023-12-26 ENCOUNTER — Ambulatory Visit: Attending: Neurology

## 2023-12-26 DIAGNOSIS — M6281 Muscle weakness (generalized): Secondary | ICD-10-CM | POA: Insufficient documentation

## 2023-12-26 DIAGNOSIS — M25521 Pain in right elbow: Secondary | ICD-10-CM | POA: Insufficient documentation

## 2023-12-26 DIAGNOSIS — Z9181 History of falling: Secondary | ICD-10-CM | POA: Diagnosis present

## 2023-12-26 DIAGNOSIS — R269 Unspecified abnormalities of gait and mobility: Secondary | ICD-10-CM | POA: Insufficient documentation

## 2023-12-26 DIAGNOSIS — R262 Difficulty in walking, not elsewhere classified: Secondary | ICD-10-CM | POA: Diagnosis present

## 2023-12-26 DIAGNOSIS — R2681 Unsteadiness on feet: Secondary | ICD-10-CM | POA: Insufficient documentation

## 2023-12-26 DIAGNOSIS — M25621 Stiffness of right elbow, not elsewhere classified: Secondary | ICD-10-CM | POA: Diagnosis present

## 2023-12-26 NOTE — Therapy (Signed)
 OUTPATIENT PHYSICAL THERAPY NEURO TREATMENT  Patient Name: Michelle Ruiz MRN: [id number] DOB:1970-05-06, 53 y.o., female Today's Date: 12/26/2023   PCP: Sherial Bail  REFERRING PROVIDER: Maree Hila K  END OF SESSION:   PT End of Session - 12/26/23 1318     Visit Number 45    Number of Visits 51    Date for Recertification  12/31/23    Progress Note Due on Visit 70    PT Start Time 1318    PT Stop Time 1400    PT Time Calculation (min) 42 min    Equipment Utilized During Treatment Gait belt    Activity Tolerance Patient tolerated treatment well    Behavior During Therapy WFL for tasks assessed/performed          Past Medical History:  Diagnosis Date   Abnormal Pap smear of cervix    Actinic keratosis 09/17/2023   R spinal mid upper back   Anxiety    Basal cell carcinoma 04/14/2008   Right upper ant. thigh. BCC with sclerosis.   Basal cell carcinoma 08/14/2008   Right lat. lower thigh. Superficial.    Basal cell carcinoma 04/29/2019   Right mid forearm. Superficial and nodular patterns. EDC.   Basal cell carcinoma 08/17/2020   Right upper antecubital. EDC.   Basal cell carcinoma 08/23/2021   Left Lower Leg, EDC   Basal cell carcinoma 09/17/2023   L medial lower leg, EDC   Kidney stones    MS (multiple sclerosis)    UTI (urinary tract infection)    Past Surgical History:  Procedure Laterality Date   BREAST BIOPSY Right 2010   stereotactic biopsy/ neg/ dr.byrnett   BREAST BIOPSY Left 2012   neg/dr. brynett   CESAREAN SECTION  2008   RPH   TONSILLECTOMY  2006   There are no active problems to display for this patient.  ONSET DATE: diagnosed 12 years ago.   REFERRING DIAG: MS, gait and mobility   THERAPY DIAG:  Difficulty in walking, not elsewhere classified  Abnormality of gait and mobility  Unsteadiness on feet  Muscle weakness (generalized)  History of falling  Stiffness of right elbow, not elsewhere classified  Pain in right  elbow  Rationale for Evaluation and Treatment: Rehabilitation  SUBJECTIVE:                                                                                                                                                                                             SUBJECTIVE STATEMENT:  Pt reports she is fatigued and tired today.  Pt notes she had a meeting this morning with HR and  managers.     Pt accompanied by: self  PERTINENT HISTORY: Patient was seen by physical therapy until September of last year. Pmh includes MS, UTI, anxiety. Has extreme spasticity in LLE. Extreme cold and heat affect her LLE. Patient is now taking hormonal medication. Patient is a Chartered certified accountant.   PAIN:  Are you having pain? No  PRECAUTIONS: None  RED FLAGS: None   WEIGHT BEARING RESTRICTIONS: No  FALLS: Has patient fallen in last 6 months? No  LIVING ENVIRONMENT: Lives with: lives with their family Lives in: House/apartment Stairs: no stairs to enter, lives on second floor Has following equipment at home: Single point cane and shower chair  PLOF: Independent  PATIENT GOALS: continue walking as long as she can.    OBJECTIVE:  Note: Objective measures were completed at Evaluation unless otherwise noted.  DIAGNOSTIC FINDINGS: n/a  COGNITION: Overall cognitive status: Within functional limits for tasks assessed   SENSATION: WFL; slight decrease to RLE   COORDINATION: Heel slide: limited LLE   MUSCLE TONE: LLE: Mild and Moderate  POSTURE: No Significant postural limitations  LOWER EXTREMITY ROM:     WFL  LOWER EXTREMITY MMT:    MMT Right Eval Left Eval  Hip flexion 6.4 4.2  Hip extension    Hip abduction 7.9 3.4  Hip adduction 7.5 8.5  Hip internal rotation    Hip external rotation    Knee flexion 11.1 10.2  Knee extension 7.8 10  Ankle dorsiflexion 10.8 6.8  Ankle plantarflexion 7.2 6.9  Ankle inversion    Ankle eversion    (Blank rows = not tested)  BED  MOBILITY:  Scooting into bed is very challenging at end of night  TRANSFERS: Assistive device utilized: Single point cane  Sit to stand: Complete Independence Stand to sit: Complete Independence Chair to chair: Complete Independence  STAIRS: Level of Assistance: CGA Stair Negotiation Technique: Alternating Pattern  with Single Rail on Right Number of Stairs: 5  Height of Stairs: 6 inch   GAIT: Gait pattern: step through pattern and poor foot clearance- Left Distance walked: 60 ft Assistive device utilized: None Level of assistance: CGA Comments: L foot scuffing  FUNCTIONAL TESTS:  5 times sit to stand: 13 seconds with arms crossed.  6 minute walk test: next session  Functional gait assessment: 14/30  PATIENT SURVEYS:  ABC scale 60.5%                                                                                                                               TREATMENT DATE: 12/26/23   Gait belt donned with CGA for all standing activities, thorough therapeutic rest breaks provided throughout session.   TherAct:  The patient completed 10 minutes at level(s) 2-7 on the NuStep using both BUE/BLE reciprocal movements to promote strength, endurance, and cardiorespiratory fitness. PT increased the resistance level and monitored the patient's response to the intervention throughout. The patient required min cueing for technique and  supervision level of assistance.    TherEx:  Seated leg press, 92.5#, x10; 85#, x10   Neuro:  The patient engaged in the Bivalve game on the [place] platform for 14 minutes at stability level 2.0. This game involves guiding a virtual ball through an obstacle course to collect coins by shifting weight and adjusting posture on the balance board, requiring dynamic balance, core stability, coordination, and spatial awareness. The activity challenges the patient's motor control and postural responses in a motivating, interactive environment. The  patient performed the task with B UE assistance, with minimal verbal cueing, and demonstrated good tolerance.      PATIENT EDUCATION: Education details: Exercise technique; importance of daily stretching Person educated: Patient Education method: Explanation, Demonstration, Tactile cues, and Verbal cues Education comprehension: verbalized understanding, returned demonstration, verbal cues required, and tactile cues required   HOME EXERCISE PROGRAM: Access Code: [email or web address] URL: [email or web address] Date: 08/13/2023 Prepared by: Reyes London  Exercises - Hooklying Single Knee to Chest Stretch  - 1-2 x daily - 3 sets - 30 sec  hold - Supine Lower Trunk Rotation  - 1-2 x daily - 3 sets - 30 sec hold - Supine Hamstring Stretch with Strap  - 1-2 x daily - 3 sets - 30 sec hold - Seated Hamstring Stretch  - 1-2 x daily - 3 sets - 30 sec hold - Supine Hip Internal and External Rotation  - 1-2 x daily - 3 sets - 10 reps - Seated Hip External Rotation Stretch  - 1 x daily - 3 sets - 30 sec hold - Supine Figure 4 Piriformis Stretch  - 1 x daily - 3 sets - 30 sec hold   Access Code: [email or web address] URL: [email or web address] Date: 05/29/2023 Prepared by: Marina  Moser  Exercises - Towel Scrunches  - 1 x daily - 7 x weekly - 2 sets - 10 reps - 5 hold - Toe Spreading  - 1 x daily - 7 x weekly - 2 sets - 10 reps - 5 hold - Seated Ankle Alphabet  - 1 x daily - 7 x weekly - 2 sets - 1 reps - 5 hold    GOALS:  Goals reviewed with patient? Yes  SHORT TERM GOALS: Target date: 05/22/2023  Patient will be independent in home exercise program to improve strength/mobility for better functional independence with ADLs. Baseline: 4/14: compliant  Goal status: MET    LONG TERM GOALS: Target date: 12/31/2023  Patient will increase Functional Gait Assessment score to >25/30 as to reduce fall risk and improve dynamic gait safety with community ambulation. Baseline: 1/28:  14/30   4/14: 17/30   6/11: 18/30  7/14: 20/30 12/05/23:  Goal status: Partially Met  2.   Patient (< 21 years old) will complete five times sit to stand test in < 10 seconds indicating an increased LE strength and improved balance. Baseline: 1/28: 13 seconds  4/14: 10.72 seconds no hands  6/11: 10 seconds no hands  12/05/23: 14.44 sec no hands Goal status: Re-Assessed  3.  Patient will increase six minute walk test distance to >1000 for progression to community ambulator and improve gait ability Baseline: Perform next session 3/4: 820 ft  4/14: 890 ft with [facility]   6/11: 888 ft with [facility]  7/14: 870 ft with [facility]- only one instance of L foot catching on ground throughout test 12/05/23: 818 ft with SPC and new Cadense shoes Goal status: Partially Met   4.  Patient will return to nightly ambulation's with less than  five foot scuffs per ambulation to return to PLOF.  Baseline: 1/28: limited ambulation  4/14: some limitations pending on day. Encouraged to perform multiple short walks  6/11: started doing nightly walks ; decreased foot scuffs   7/14: pt reports no scuffs Goal status: MET  5. Patient will increase ABC scale score >80% to demonstrate better functional mobility and better confidence with ADLs.  Baseline: 1/28: 60%   4/14: 65%   6/11: 68%  7/14: 59.38% 12/05/23: 56.88% Goal status: Partially Met  6.  Pt will complete 30 minutes of low-impact cardio exercise (such as stationary cycling) 3 times per week for the next 6 weeks, gradually increasing the duration by 5 minutes every 2 weeks, to improve my cardiovascular endurance while monitoring for signs of fatigue or overheating. Baseline: 10/22/23: pt able to achieve 1 bout of 20 min in a week 12/05/23: Pt able to achieve 2 bouts of 25 minutes last week.   Goal Status: NEW   ASSESSMENT:  CLINICAL IMPRESSION:    Pt performed well and was able to improve in her strength with performing the seated leg press.  Pt reported some  fatigue at the conclusion of the session as expected.  Pt will continue to improve with overall endurance levels and was encouraged to continue with ambulation attempts at home along with the biking.   Pt will continue to benefit from skilled therapy to address remaining deficits in order to improve overall QoL and return to PLOF.        OBJECTIVE IMPAIRMENTS: Abnormal gait, decreased activity tolerance, decreased balance, decreased coordination, decreased endurance, decreased mobility, difficulty walking, decreased strength, impaired perceived functional ability, impaired flexibility, impaired UE functional use, and improper body mechanics.   ACTIVITY LIMITATIONS: carrying, lifting, bending, sitting, standing, squatting, stairs, transfers, bed mobility, dressing, reach over head, hygiene/grooming, locomotion level, and caring for others  PARTICIPATION LIMITATIONS: meal prep, cleaning, laundry, interpersonal relationship, driving, shopping, community activity, and occupation  PERSONAL FACTORS: Age, Fitness, Past/current experiences, Profession, Time since onset of injury/illness/exacerbation, and 1-2 comorbidities: MS anxiety are also affecting patient's functional outcome.   REHAB POTENTIAL: Good  CLINICAL DECISION MAKING: Evolving/moderate complexity  EVALUATION COMPLEXITY: Moderate  PLAN:  PT FREQUENCY: 2x/week  PT DURATION: 12 weeks  PLANNED INTERVENTIONS: 97164- PT Re-evaluation, 97110-Therapeutic exercises, 97530- Therapeutic activity, 97112- Neuromuscular re-education, 97535- Self Care, 02859- Manual therapy, [phone]- Gait training, [phone]- Orthotic Fit/training, [phone]- Canalith repositioning, Patient/Family education, Balance training, Stair training, Taping, Dry Needling, Joint mobilization, Joint manipulation, Spinal mobilization, Vestibular training, Visual/preceptual remediation/compensation, DME instructions, Cryotherapy, and Moist heat  PLAN FOR NEXT SESSION:     airex  pad/balance, LLE strengthening, HEP , LLE coordination. Adding cognitive/manual dual task with dynamic balance activities     Fonda Simpers, PT, DPT Physical Therapist - [facility]  [facility]  12/26/23, 3:35 PM

## 2023-12-31 ENCOUNTER — Ambulatory Visit

## 2023-12-31 DIAGNOSIS — M25521 Pain in right elbow: Secondary | ICD-10-CM

## 2023-12-31 DIAGNOSIS — M25621 Stiffness of right elbow, not elsewhere classified: Secondary | ICD-10-CM

## 2023-12-31 DIAGNOSIS — R262 Difficulty in walking, not elsewhere classified: Secondary | ICD-10-CM | POA: Diagnosis not present

## 2023-12-31 DIAGNOSIS — Z9181 History of falling: Secondary | ICD-10-CM

## 2023-12-31 DIAGNOSIS — M6281 Muscle weakness (generalized): Secondary | ICD-10-CM

## 2023-12-31 DIAGNOSIS — R2681 Unsteadiness on feet: Secondary | ICD-10-CM

## 2023-12-31 DIAGNOSIS — R269 Unspecified abnormalities of gait and mobility: Secondary | ICD-10-CM

## 2023-12-31 NOTE — Therapy (Signed)
 OUTPATIENT PHYSICAL THERAPY NEURO TREATMENT  Patient Name: Michelle Ruiz MRN: [id number] DOB:[birth year], 53 y.o., female Today's Date: 12/31/2023   PCP: Sherial Bail  REFERRING PROVIDER: Maree Hila K  END OF SESSION:   PT End of Session - 12/31/23 1319     Visit Number 46    Number of Visits 51    Date for Recertification  12/31/23    Progress Note Due on Visit 70    PT Start Time 1319    PT Stop Time 1400    PT Time Calculation (min) 41 min    Equipment Utilized During Treatment Gait belt    Activity Tolerance Patient tolerated treatment well    Behavior During Therapy WFL for tasks assessed/performed           Past Medical History:  Diagnosis Date   Abnormal Pap smear of cervix    Actinic keratosis 09/17/2023   R spinal mid upper back   Anxiety    Basal cell carcinoma 04/14/2008   Right upper ant. thigh. BCC with sclerosis.   Basal cell carcinoma 08/14/2008   Right lat. lower thigh. Superficial.    Basal cell carcinoma 04/29/2019   Right mid forearm. Superficial and nodular patterns. EDC.   Basal cell carcinoma 08/17/2020   Right upper antecubital. EDC.   Basal cell carcinoma 08/23/2021   Left Lower Leg, EDC   Basal cell carcinoma 09/17/2023   L medial lower leg, EDC   Kidney stones    MS (multiple sclerosis)    UTI (urinary tract infection)    Past Surgical History:  Procedure Laterality Date   BREAST BIOPSY Right 2010   stereotactic biopsy/ neg/ dr.byrnett   BREAST BIOPSY Left 2012   neg/dr. brynett   CESAREAN SECTION  2008   RPH   TONSILLECTOMY  2006   There are no active problems to display for this patient.  ONSET DATE: diagnosed 12 years ago.   REFERRING DIAG: MS, gait and mobility   THERAPY DIAG:  Difficulty in walking, not elsewhere classified  Abnormality of gait and mobility  Unsteadiness on feet  Muscle weakness (generalized)  History of falling  Stiffness of right elbow, not elsewhere classified  Pain in right  elbow  Rationale for Evaluation and Treatment: Rehabilitation  SUBJECTIVE:                                                                                                                                                                                             SUBJECTIVE STATEMENT:  Pt reports she had an uneventful weekend.  Pt denies any other complaints/falls since the last visit.  Pt accompanied by: self  PERTINENT HISTORY: Patient was seen by physical therapy until September of last year. Pmh includes MS, UTI, anxiety. Has extreme spasticity in LLE. Extreme cold and heat affect her LLE. Patient is now taking hormonal medication. Patient is a Chartered certified accountant.   PAIN:  Are you having pain? No  PRECAUTIONS: None  RED FLAGS: None   WEIGHT BEARING RESTRICTIONS: No  FALLS: Has patient fallen in last 6 months? No  LIVING ENVIRONMENT: Lives with: lives with their family Lives in: House/apartment Stairs: no stairs to enter, lives on second floor Has following equipment at home: Single point cane and shower chair  PLOF: Independent  PATIENT GOALS: continue walking as long as she can.    OBJECTIVE:  Note: Objective measures were completed at Evaluation unless otherwise noted.  DIAGNOSTIC FINDINGS: n/a  COGNITION: Overall cognitive status: Within functional limits for tasks assessed   SENSATION: WFL; slight decrease to RLE   COORDINATION: Heel slide: limited LLE   MUSCLE TONE: LLE: Mild and Moderate  POSTURE: No Significant postural limitations  LOWER EXTREMITY ROM:     WFL  LOWER EXTREMITY MMT:    MMT Right Eval Left Eval  Hip flexion 6.4 4.2  Hip extension    Hip abduction 7.9 3.4  Hip adduction 7.5 8.5  Hip internal rotation    Hip external rotation    Knee flexion 11.1 10.2  Knee extension 7.8 10  Ankle dorsiflexion 10.8 6.8  Ankle plantarflexion 7.2 6.9  Ankle inversion    Ankle eversion    (Blank rows = not tested)  BED MOBILITY:   Scooting into bed is very challenging at end of night  TRANSFERS: Assistive device utilized: Single point cane  Sit to stand: Complete Independence Stand to sit: Complete Independence Chair to chair: Complete Independence  STAIRS: Level of Assistance: CGA Stair Negotiation Technique: Alternating Pattern  with Single Rail on Right Number of Stairs: 5  Height of Stairs: 6 inch   GAIT: Gait pattern: step through pattern and poor foot clearance- Left Distance walked: 60 ft Assistive device utilized: None Level of assistance: CGA Comments: L foot scuffing  FUNCTIONAL TESTS:  5 times sit to stand: 13 seconds with arms crossed.  6 minute walk test: next session  Functional gait assessment: 14/30  PATIENT SURVEYS:  ABC scale 60.5%                                                                                                                               TREATMENT DATE: 12/31/23   Gait belt donned with CGA for all standing activities, thorough therapeutic rest breaks provided throughout session.   TherAct:  Patient ambulated 20 min indoor/outdoor today- including tile, concrete, carpet, horizontal head turns, on varied surfaces: concrete, brick, asphalt, up ramp, inclines, declines- with 1 seated rest break(s) - CGA for all indoor/outdoor - 1 instance of instability when ambulating over pine needles - Focusing on overall endurance and to  improve ambulation across uneven terrain that pt would come in contact with in the community.   TherEx:  Seated ankle 4 way with GTB, 2x10 each direction, each LE  Seated leg press, 92.5#, x10   PATIENT EDUCATION: Education details: Exercise technique; importance of daily stretching Person educated: Patient Education method: Explanation, Demonstration, Tactile cues, and Verbal cues Education comprehension: verbalized understanding, returned demonstration, verbal cues required, and tactile cues required   HOME EXERCISE PROGRAM: Access  Code: [email or web address] URL: [email or web address] Date: 08/13/2023 Prepared by: Reyes London  Exercises - Hooklying Single Knee to Chest Stretch  - 1-2 x daily - 3 sets - 30 sec  hold - Supine Lower Trunk Rotation  - 1-2 x daily - 3 sets - 30 sec hold - Supine Hamstring Stretch with Strap  - 1-2 x daily - 3 sets - 30 sec hold - Seated Hamstring Stretch  - 1-2 x daily - 3 sets - 30 sec hold - Supine Hip Internal and External Rotation  - 1-2 x daily - 3 sets - 10 reps - Seated Hip External Rotation Stretch  - 1 x daily - 3 sets - 30 sec hold - Supine Figure 4 Piriformis Stretch  - 1 x daily - 3 sets - 30 sec hold   Access Code: [email or web address] URL: [email or web address] Date: 05/29/2023 Prepared by: Marina  Moser  Exercises - Towel Scrunches  - 1 x daily - 7 x weekly - 2 sets - 10 reps - 5 hold - Toe Spreading  - 1 x daily - 7 x weekly - 2 sets - 10 reps - 5 hold - Seated Ankle Alphabet  - 1 x daily - 7 x weekly - 2 sets - 1 reps - 5 hold    GOALS:  Goals reviewed with patient? Yes  SHORT TERM GOALS: Target date: 05/22/2023  Patient will be independent in home exercise program to improve strength/mobility for better functional independence with ADLs. Baseline: 4/14: compliant  Goal status: MET    LONG TERM GOALS: Target date: 12/31/2023  Patient will increase Functional Gait Assessment score to >25/30 as to reduce fall risk and improve dynamic gait safety with community ambulation. Baseline: 1/28: 14/30   4/14: 17/30   6/11: 18/30  7/14: 20/30 12/05/23:  Goal status: Partially Met  2.   Patient (< 48 years old) will complete five times sit to stand test in < 10 seconds indicating an increased LE strength and improved balance. Baseline: 1/28: 13 seconds  4/14: 10.72 seconds no hands  6/11: 10 seconds no hands  12/05/23: 14.44 sec no hands Goal status: Re-Assessed  3.  Patient will increase six minute walk test distance to >1000 for progression to  community ambulator and improve gait ability Baseline: Perform next session 3/4: 820 ft  4/14: 890 ft with [facility]   6/11: 888 ft with [facility]  7/14: 870 ft with SPC- only one instance of L foot catching on ground throughout test 12/05/23: 818 ft with SPC and new Cadense shoes Goal status: Partially Met   4.  Patient will return to nightly ambulation's with less than five foot scuffs per ambulation to return to PLOF.  Baseline: 1/28: limited ambulation  4/14: some limitations pending on day. Encouraged to perform multiple short walks  6/11: started doing nightly walks ; decreased foot scuffs   7/14: pt reports no scuffs Goal status: MET  5. Patient will increase ABC scale score >80% to demonstrate better functional mobility and better confidence with ADLs.  Baseline: 1/28: 60%  4/14: 65%   6/11: 68%  7/14: 59.38% 12/05/23: 56.88% Goal status: Partially Met  6.  Pt will complete 30 minutes of low-impact cardio exercise (such as stationary cycling) 3 times per week for the next 6 weeks, gradually increasing the duration by 5 minutes every 2 weeks, to improve my cardiovascular endurance while monitoring for signs of fatigue or overheating. Baseline: 10/22/23: pt able to achieve 1 bout of 20 min in a week 12/05/23: Pt able to achieve 2 bouts of 25 minutes last week.   Goal Status: NEW   ASSESSMENT:  CLINICAL IMPRESSION:    Pt continues to respond well with the exercises.  Pt was limited outside and with ambulation attempt due to the temperature outside.  Pt encouraged to continue to perform HEP at home to improve overall endurance levels and tolerance to exercise moving forward.   Pt will continue to benefit from skilled therapy to address remaining deficits in order to improve overall QoL and return to PLOF.        OBJECTIVE IMPAIRMENTS: Abnormal gait, decreased activity tolerance, decreased balance, decreased coordination, decreased endurance, decreased mobility, difficulty walking,  decreased strength, impaired perceived functional ability, impaired flexibility, impaired UE functional use, and improper body mechanics.   ACTIVITY LIMITATIONS: carrying, lifting, bending, sitting, standing, squatting, stairs, transfers, bed mobility, dressing, reach over head, hygiene/grooming, locomotion level, and caring for others  PARTICIPATION LIMITATIONS: meal prep, cleaning, laundry, interpersonal relationship, driving, shopping, community activity, and occupation  PERSONAL FACTORS: Age, Fitness, Past/current experiences, Profession, Time since onset of injury/illness/exacerbation, and 1-2 comorbidities: MS anxiety are also affecting patient's functional outcome.   REHAB POTENTIAL: Good  CLINICAL DECISION MAKING: Evolving/moderate complexity  EVALUATION COMPLEXITY: Moderate  PLAN:  PT FREQUENCY: 2x/week  PT DURATION: 12 weeks  PLANNED INTERVENTIONS: 97164- PT Re-evaluation, 97110-Therapeutic exercises, 97530- Therapeutic activity, 97112- Neuromuscular re-education, 97535- Self Care, 02859- Manual therapy, [phone]- Gait training, [phone]- Orthotic Fit/training, [phone]- Canalith repositioning, Patient/Family education, Balance training, Stair training, Taping, Dry Needling, Joint mobilization, Joint manipulation, Spinal mobilization, Vestibular training, Visual/preceptual remediation/compensation, DME instructions, Cryotherapy, and Moist heat  PLAN FOR NEXT SESSION:     airex pad/balance, LLE strengthening, HEP , LLE coordination. Adding cognitive/manual dual task with dynamic balance activities     Fonda Simpers, PT, DPT Physical Therapist - [facility]  12/31/23, 2:23 PM

## 2024-01-02 ENCOUNTER — Ambulatory Visit

## 2024-01-02 DIAGNOSIS — M6281 Muscle weakness (generalized): Secondary | ICD-10-CM

## 2024-01-02 DIAGNOSIS — R262 Difficulty in walking, not elsewhere classified: Secondary | ICD-10-CM

## 2024-01-02 DIAGNOSIS — R2681 Unsteadiness on feet: Secondary | ICD-10-CM

## 2024-01-02 DIAGNOSIS — R269 Unspecified abnormalities of gait and mobility: Secondary | ICD-10-CM

## 2024-01-02 NOTE — Therapy (Signed)
 OUTPATIENT PHYSICAL THERAPY TREATMENT/RECERT  Patient Name: Michelle Ruiz MRN: [id number] DOB:08-Sep-1970, 53 y.o., female Today's Date: 01/02/2024  PCP: Sherial Bail  REFERRING PROVIDER: Maree Hila K  END OF SESSION:   PT End of Session - 01/02/24 1545     Visit Number 47    Number of Visits 51    Date for Recertification  02/27/24    Progress Note Due on Visit 50    PT Start Time 1532    PT Stop Time 1612    PT Time Calculation (min) 40 min    Equipment Utilized During Treatment Gait belt    Activity Tolerance Patient tolerated treatment well;No increased pain    Behavior During Therapy WFL for tasks assessed/performed           Past Medical History:  Diagnosis Date   Abnormal Pap smear of cervix    Actinic keratosis 09/17/2023   R spinal mid upper back   Anxiety    Basal cell carcinoma 04/14/2008   Right upper ant. thigh. BCC with sclerosis.   Basal cell carcinoma 08/14/2008   Right lat. lower thigh. Superficial.    Basal cell carcinoma 04/29/2019   Right mid forearm. Superficial and nodular patterns. EDC.   Basal cell carcinoma 08/17/2020   Right upper antecubital. EDC.   Basal cell carcinoma 08/23/2021   Left Lower Leg, EDC   Basal cell carcinoma 09/17/2023   L medial lower leg, EDC   Kidney stones    MS (multiple sclerosis)    UTI (urinary tract infection)    Past Surgical History:  Procedure Laterality Date   BREAST BIOPSY Right 2010   stereotactic biopsy/ neg/ dr.byrnett   BREAST BIOPSY Left 2012   neg/dr. brynett   CESAREAN SECTION  2008   RPH   TONSILLECTOMY  2006   There are no active problems to display for this patient.  ONSET DATE: diagnosed 12 years ago.   REFERRING DIAG: MS, gait and mobility   THERAPY DIAG:  Difficulty in walking, not elsewhere classified  Abnormality of gait and mobility  Unsteadiness on feet  Muscle weakness (generalized)  Rationale for Evaluation and Treatment: Rehabilitation  SUBJECTIVE:                                                                                                                                                                                              SUBJECTIVE STATEMENT: Pt says she is doing well. She is anxious to proove she can 5xSTS better than 4 weeks ago at that assessment.   Pt accompanied by: self  PERTINENT HISTORY: Patient was seen by physical therapy  until September of last year. Pmh includes MS, UTI, anxiety. Has extreme spasticity in LLE. Extreme cold and heat affect her LLE. Patient is now taking hormonal medication. Patient is a Chartered certified accountant.   PAIN:  Are you having pain? No  PRECAUTIONS: None  RED FLAGS: None   WEIGHT BEARING RESTRICTIONS: No  FALLS: Has patient fallen in last 6 months? No  LIVING ENVIRONMENT: Lives with: lives with their family Lives in: House/apartment Stairs: no stairs to enter, lives on second floor Has following equipment at home: Single point cane and shower chair  PLOF: Independent  PATIENT GOALS: continue walking as long as she can.   OBJECTIVE:  Note: Objective measures were completed at Evaluation unless otherwise noted.  LOWER EXTREMITY MMT:    MMT Right Eval Left Eval  Hip flexion 6.4 4.2  Hip extension    Hip abduction 7.9 3.4  Hip adduction 7.5 8.5  Hip internal rotation    Hip external rotation    Knee flexion 11.1 10.2  Knee extension 7.8 10  Ankle dorsiflexion 10.8 6.8  Ankle plantarflexion 7.2 6.9  Ankle inversion    Ankle eversion    (Blank rows = not tested)   FUNCTIONAL TESTS:  5 times sit to stand: 13 seconds with arms crossed.  6 minute walk test: next session  Functional gait assessment: 14/30  PATIENT SURVEYS:  ABC scale 60.5%                                                                                                                               TREATMENT DATE: 01/02/24   -948ft AMB overground SPC -5xSTS, hands free, 2 sets: 11sec, 9sec  -FGA  assessment: 20/30    PATIENT EDUCATION: Education details: Exercise technique; importance of daily stretching Person educated: Patient Education method: Explanation, Demonstration, Tactile cues, and Verbal cues Education comprehension: verbalized understanding, returned demonstration, verbal cues required, and tactile cues required   HOME EXERCISE PROGRAM: Access Code: [email or web address] URL: [email or web address] Date: 08/13/2023 Prepared by: Reyes London  Exercises - Hooklying Single Knee to Chest Stretch  - 1-2 x daily - 3 sets - 30 sec  hold - Supine Lower Trunk Rotation  - 1-2 x daily - 3 sets - 30 sec hold - Supine Hamstring Stretch with Strap  - 1-2 x daily - 3 sets - 30 sec hold - Seated Hamstring Stretch  - 1-2 x daily - 3 sets - 30 sec hold - Supine Hip Internal and External Rotation  - 1-2 x daily - 3 sets - 10 reps - Seated Hip External Rotation Stretch  - 1 x daily - 3 sets - 30 sec hold - Supine Figure 4 Piriformis Stretch  - 1 x daily - 3 sets - 30 sec hold  GOALS:  Goals reviewed with patient? Yes  SHORT TERM GOALS: Target date: 05/22/2023  Patient will be independent in home exercise program to improve strength/mobility for better functional independence with ADLs. Baseline:  4/14: compliant  Goal status: MET   LONG TERM GOALS: Target date: 12/31/2023  Patient will increase Functional Gait Assessment score to >25/30 as to reduce fall risk and improve dynamic gait safety with community ambulation. Baseline: 1/28: 14/30   4/14: 17/30   6/11: 18/30  7/14: 20/30 12/05/23:    01/02/24: 20/30 Goal status: Partially Met  2.   Patient (< 36 years old) will complete five times sit to stand test in <10 seconds indicating an increased LE strength and improved balance. Baseline: 1/28: 13 seconds  4/14: 10.72 seconds no hands  6/11: 10 seconds no hands  12/05/23: 14.44 sec no hands 01/02/24: 11.71 sec hands free; 9.90sec Goal status: Re-Assessed  3.   Patient will increase six minute walk test distance to >1000 for progression to community ambulator and improve gait ability Baseline: Perform next session 3/4: 820 ft  4/14: 890 ft with [facility]   6/11: 888 ft with [facility]  7/14: 870 ft with SPC- only one instance of L foot catching on ground throughout test 12/05/23: 818 ft with SPC and new [employer] shoes 01/02/24: 980ft c SPC and cadence  Goal status: Partially Met   4.  Patient will return to nightly ambulations with less than five foot scuffs per ambulation to return to PLOF.  Baseline: 1/28: limited ambulation  4/14: some limitations pending on day. Encouraged to perform multiple short walks  6/11: started doing nightly walks ; decreased foot scuffs   7/14: pt reports no scuffs Goal status: MET  5. Patient will increase ABC scale score >80% to demonstrate better functional mobility and better confidence with ADLs.  Baseline: 1/28: 60%   4/14: 65%   6/11: 68%  7/14: 59.38% 12/05/23: 56.88% 01/02/24: pending Goal status: Partially Met  6.  Pt will complete 30 minutes of low-impact cardio exercise (such as stationary cycling) 3 times per week for the next 6 weeks, gradually increasing the duration by 5 minutes every 2 weeks, to improve my cardiovascular endurance while monitoring for signs of fatigue or overheating. Baseline: 10/22/23: pt able to achieve 1 bout of 20 min in a week 12/05/23: Pt able to achieve 2 bouts of 25 minutes last week.   Goal Status: NEW   ASSESSMENT:  CLINICAL IMPRESSION:    Reassessment with greatest 5xSTS score to date! Balance assessment largel unchanged in past 4 weeks, but showing excellent preservation of gains made since starting PT here. Pt remains vested in maximizing her capacity for movement.  Pt will continue to benefit from skilled therapy to address remaining deficits in order to improve overall QoL and return to PLOF.  Pt has a lot of potential to improve on these measures overall.    OBJECTIVE  IMPAIRMENTS: Abnormal gait, decreased activity tolerance, decreased balance, decreased coordination, decreased endurance, decreased mobility, difficulty walking, decreased strength, impaired perceived functional ability, impaired flexibility, impaired UE functional use, and improper body mechanics.   ACTIVITY LIMITATIONS: carrying, lifting, bending, sitting, standing, squatting, stairs, transfers, bed mobility, dressing, reach over head, hygiene/grooming, locomotion level, and caring for others  PARTICIPATION LIMITATIONS: meal prep, cleaning, laundry, interpersonal relationship, driving, shopping, community activity, and occupation  PERSONAL FACTORS: Age, Fitness, Past/current experiences, Profession, Time since onset of injury/illness/exacerbation, and 1-2 comorbidities: MS anxiety are also affecting patient's functional outcome.  REHAB POTENTIAL: Good CLINICAL DECISION MAKING: Evolving/moderate complexity EVALUATION COMPLEXITY: Moderate  PLAN:  PT FREQUENCY: 2x/week PT DURATION: 12 weeks PLANNED INTERVENTIONS: 97164- PT Re-evaluation, 97110-Therapeutic exercises, 97530- Therapeutic activity, [id number]- Neuromuscular re-education, 97535- Self Care,  02859- Manual therapy, [id number]- Gait training, 02239- Orthotic Fit/training, 04007- Canalith repositioning, Patient/Family education, Balance training, Stair training, Taping, Dry Needling, Joint mobilization, Joint manipulation, Spinal mobilization, Vestibular training, Visual/preceptual remediation/compensation, DME instructions, Cryotherapy, and Moist heat  PLAN FOR NEXT SESSION:   airex pad/balance, LLE strengthening, HEP , LLE coordination. Adding cognitive/manual dual task with dynamic balance activities     4:22 PM, 01/02/24 Peggye JAYSON Linear, PT, DPT Physical Therapist - [facility]  Outpatient Physical Therapy- [facility] [phone]

## 2024-01-07 ENCOUNTER — Ambulatory Visit

## 2024-01-07 NOTE — Therapy (Incomplete)
 OUTPATIENT PHYSICAL THERAPY TREATMENT/RECERT  Patient Name: Michelle Ruiz MRN: [id number] DOB:10/25/1970, 53 y.o., female Today's Date: 01/07/2024  PCP: Sherial Bail  REFERRING PROVIDER: Maree Hila K  END OF SESSION:      Past Medical History:  Diagnosis Date   Abnormal Pap smear of cervix    Actinic keratosis 09/17/2023   R spinal mid upper back   Anxiety    Basal cell carcinoma 04/14/2008   Right upper ant. thigh. BCC with sclerosis.   Basal cell carcinoma 08/14/2008   Right lat. lower thigh. Superficial.    Basal cell carcinoma 04/29/2019   Right mid forearm. Superficial and nodular patterns. EDC.   Basal cell carcinoma 08/17/2020   Right upper antecubital. EDC.   Basal cell carcinoma 08/23/2021   Left Lower Leg, EDC   Basal cell carcinoma 09/17/2023   L medial lower leg, EDC   Kidney stones    MS (multiple sclerosis)    UTI (urinary tract infection)    Past Surgical History:  Procedure Laterality Date   BREAST BIOPSY Right 2010   stereotactic biopsy/ neg/ dr.byrnett   BREAST BIOPSY Left 2012   neg/dr. brynett   CESAREAN SECTION  2008   RPH   TONSILLECTOMY  2006   There are no active problems to display for this patient.  ONSET DATE: diagnosed 12 years ago.   REFERRING DIAG: MS, gait and mobility   THERAPY DIAG:  No diagnosis found.  Rationale for Evaluation and Treatment: Rehabilitation  SUBJECTIVE:                                                                                                                                                                                             SUBJECTIVE STATEMENT:  ***  Pt accompanied by: self  PERTINENT HISTORY: Patient was seen by physical therapy until September of last year. Pmh includes MS, UTI, anxiety. Has extreme spasticity in LLE. Extreme cold and heat affect her LLE. Patient is now taking hormonal medication. Patient is a Chartered certified accountant.   PAIN:  Are you having pain?  No  PRECAUTIONS: None  RED FLAGS: None   WEIGHT BEARING RESTRICTIONS: No  FALLS: Has patient fallen in last 6 months? No  LIVING ENVIRONMENT: Lives with: lives with their family Lives in: House/apartment Stairs: no stairs to enter, lives on second floor Has following equipment at home: Single point cane and shower chair  PLOF: Independent  PATIENT GOALS: continue walking as long as she can.   OBJECTIVE:  Note: Objective measures were completed at Evaluation unless otherwise noted.  LOWER EXTREMITY MMT:    MMT Right Eval Left Eval  Hip flexion 6.4 4.2  Hip extension    Hip abduction 7.9 3.4  Hip adduction 7.5 8.5  Hip internal rotation    Hip external rotation    Knee flexion 11.1 10.2  Knee extension 7.8 10  Ankle dorsiflexion 10.8 6.8  Ankle plantarflexion 7.2 6.9  Ankle inversion    Ankle eversion    (Blank rows = not tested)   FUNCTIONAL TESTS:  5 times sit to stand: 13 seconds with arms crossed.  6 minute walk test: next session  Functional gait assessment: 14/30  PATIENT SURVEYS:  ABC scale 60.5%                                                                                                                               TREATMENT DATE: 01/07/24   Gait belt donned with CGA for all standing activities, thorough therapeutic rest breaks provided throughout session.  ***   TherAct:   Patient ambulated 20 min indoor/outdoor today- including tile, concrete, carpet, horizontal head turns, on varied surfaces: concrete, brick, asphalt, up ramp, inclines, declines- with 1 seated rest break(s) - CGA for all indoor/outdoor - 1 instance of instability when ambulating over pine needles - Focusing on overall endurance and to improve ambulation across uneven terrain that pt would come in contact with in the community.     TherEx:   Seated ankle 4 way with GTB, 2x10 each direction, each LE   Seated leg press, 92.5#, x10   PATIENT EDUCATION: Education details:  Exercise technique; importance of daily stretching Person educated: Patient Education method: Explanation, Demonstration, Tactile cues, and Verbal cues Education comprehension: verbalized understanding, returned demonstration, verbal cues required, and tactile cues required   HOME EXERCISE PROGRAM: Access Code: [email or web address] URL: [email or web address] Date: 08/13/2023 Prepared by: Reyes London  Exercises - Hooklying Single Knee to Chest Stretch  - 1-2 x daily - 3 sets - 30 sec  hold - Supine Lower Trunk Rotation  - 1-2 x daily - 3 sets - 30 sec hold - Supine Hamstring Stretch with Strap  - 1-2 x daily - 3 sets - 30 sec hold - Seated Hamstring Stretch  - 1-2 x daily - 3 sets - 30 sec hold - Supine Hip Internal and External Rotation  - 1-2 x daily - 3 sets - 10 reps - Seated Hip External Rotation Stretch  - 1 x daily - 3 sets - 30 sec hold - Supine Figure 4 Piriformis Stretch  - 1 x daily - 3 sets - 30 sec hold  GOALS:  Goals reviewed with patient? Yes  SHORT TERM GOALS: Target date: 05/22/2023  Patient will be independent in home exercise program to improve strength/mobility for better functional independence with ADLs. Baseline: 4/14: compliant  Goal status: MET   LONG TERM GOALS: Target date: 12/31/2023  Patient will increase Functional Gait Assessment score to >25/30 as to reduce fall risk and improve dynamic gait safety with community ambulation. Baseline: 1/28: 14/30  4/14: 17/30   6/11: 18/30  7/14: 20/30 12/05/23:    01/02/24: 20/30 Goal status: Partially Met  2.   Patient (< 53 years old) will complete five times sit to stand test in <10 seconds indicating an increased LE strength and improved balance. Baseline: 1/28: 13 seconds  4/14: 10.72 seconds no hands  6/11: 10 seconds no hands  12/05/23: 14.44 sec no hands 01/02/24: 11.71 sec hands free; 9.90sec Goal status: Re-Assessed  3.  Patient will increase six minute walk test distance to >1000 for  progression to community ambulator and improve gait ability Baseline: Perform next session 3/4: 820 ft  4/14: 890 ft with [facility]   6/11: 888 ft with [facility]  7/14: 870 ft with SPC- only one instance of L foot catching on ground throughout test 12/05/23: 818 ft with SPC and new Cadense shoes 01/02/24: 975ft c SPC and cadence  Goal status: Partially Met   4.  Patient will return to nightly ambulations with less than five foot scuffs per ambulation to return to PLOF.  Baseline: 1/28: limited ambulation  4/14: some limitations pending on day. Encouraged to perform multiple short walks  6/11: started doing nightly walks ; decreased foot scuffs   7/14: pt reports no scuffs Goal status: MET  5. Patient will increase ABC scale score >80% to demonstrate better functional mobility and better confidence with ADLs.  Baseline: 1/28: 60%   4/14: 65%   6/11: 68%  7/14: 59.38% 12/05/23: 56.88% 01/02/24: pending Goal status: Partially Met  6.  Pt will complete 30 minutes of low-impact cardio exercise (such as stationary cycling) 3 times per week for the next 6 weeks, gradually increasing the duration by 5 minutes every 2 weeks, to improve my cardiovascular endurance while monitoring for signs of fatigue or overheating. Baseline: 10/22/23: pt able to achieve 1 bout of 20 min in a week 12/05/23: Pt able to achieve 2 bouts of 25 minutes last week.   Goal Status: NEW   ASSESSMENT:  CLINICAL IMPRESSION:    ***   OBJECTIVE IMPAIRMENTS: Abnormal gait, decreased activity tolerance, decreased balance, decreased coordination, decreased endurance, decreased mobility, difficulty walking, decreased strength, impaired perceived functional ability, impaired flexibility, impaired UE functional use, and improper body mechanics.   ACTIVITY LIMITATIONS: carrying, lifting, bending, sitting, standing, squatting, stairs, transfers, bed mobility, dressing, reach over head, hygiene/grooming, locomotion level, and caring for  others  PARTICIPATION LIMITATIONS: meal prep, cleaning, laundry, interpersonal relationship, driving, shopping, community activity, and occupation  PERSONAL FACTORS: Age, Fitness, Past/current experiences, Profession, Time since onset of injury/illness/exacerbation, and 1-2 comorbidities: MS anxiety are also affecting patient's functional outcome.  REHAB POTENTIAL: Good CLINICAL DECISION MAKING: Evolving/moderate complexity EVALUATION COMPLEXITY: Moderate  PLAN:  PT FREQUENCY: 2x/week PT DURATION: 12 weeks PLANNED INTERVENTIONS: 97164- PT Re-evaluation, 97110-Therapeutic exercises, 97530- Therapeutic activity, 97112- Neuromuscular re-education, 97535- Self Care, 02859- Manual therapy, [phone]- Gait training, [phone]- Orthotic Fit/training, [phone]- Canalith repositioning, Patient/Family education, Balance training, Stair training, Taping, Dry Needling, Joint mobilization, Joint manipulation, Spinal mobilization, Vestibular training, Visual/preceptual remediation/compensation, DME instructions, Cryotherapy, and Moist heat  PLAN FOR NEXT SESSION:    airex pad/balance, LLE strengthening, HEP , LLE coordination. Adding cognitive/manual dual task with dynamic balance activities     Fonda Simpers, PT, DPT Physical Therapist - [facility]  [facility]  01/07/24, 10:12 AM

## 2024-01-09 ENCOUNTER — Ambulatory Visit

## 2024-01-09 DIAGNOSIS — R262 Difficulty in walking, not elsewhere classified: Secondary | ICD-10-CM | POA: Diagnosis not present

## 2024-01-09 DIAGNOSIS — R269 Unspecified abnormalities of gait and mobility: Secondary | ICD-10-CM

## 2024-01-09 DIAGNOSIS — M25621 Stiffness of right elbow, not elsewhere classified: Secondary | ICD-10-CM

## 2024-01-09 DIAGNOSIS — Z9181 History of falling: Secondary | ICD-10-CM

## 2024-01-09 DIAGNOSIS — M25521 Pain in right elbow: Secondary | ICD-10-CM

## 2024-01-09 DIAGNOSIS — M6281 Muscle weakness (generalized): Secondary | ICD-10-CM

## 2024-01-09 DIAGNOSIS — R2681 Unsteadiness on feet: Secondary | ICD-10-CM

## 2024-01-09 NOTE — Therapy (Signed)
 OUTPATIENT PHYSICAL THERAPY TREATMENT/RECERT  Patient Name: Michelle Ruiz MRN: [id number] DOB:Dec 24, 1970, 53 y.o., female Today's Date: 01/09/2024  PCP: Sherial Bail  REFERRING PROVIDER: Maree Hila K  END OF SESSION:   PT End of Session - 01/09/24 1318     Visit Number 48    Number of Visits 51    Date for Recertification  02/27/24    Progress Note Due on Visit 50    PT Start Time 1318    PT Stop Time 1400    PT Time Calculation (min) 42 min    Equipment Utilized During Treatment Gait belt    Activity Tolerance Patient tolerated treatment well;No increased pain    Behavior During Therapy WFL for tasks assessed/performed            Past Medical History:  Diagnosis Date   Abnormal Pap smear of cervix    Actinic keratosis 09/17/2023   R spinal mid upper back   Anxiety    Basal cell carcinoma 04/14/2008   Right upper ant. thigh. BCC with sclerosis.   Basal cell carcinoma 08/14/2008   Right lat. lower thigh. Superficial.    Basal cell carcinoma 04/29/2019   Right mid forearm. Superficial and nodular patterns. EDC.   Basal cell carcinoma 08/17/2020   Right upper antecubital. EDC.   Basal cell carcinoma 08/23/2021   Left Lower Leg, EDC   Basal cell carcinoma 09/17/2023   L medial lower leg, EDC   Kidney stones    MS (multiple sclerosis)    UTI (urinary tract infection)    Past Surgical History:  Procedure Laterality Date   BREAST BIOPSY Right 2010   stereotactic biopsy/ neg/ dr.byrnett   BREAST BIOPSY Left 2012   neg/dr. brynett   CESAREAN SECTION  2008   RPH   TONSILLECTOMY  2006   There are no active problems to display for this patient.  ONSET DATE: diagnosed 12 years ago.   REFERRING DIAG: MS, gait and mobility   THERAPY DIAG:  Difficulty in walking, not elsewhere classified  Abnormality of gait and mobility  Unsteadiness on feet  Muscle weakness (generalized)  History of falling  Stiffness of right elbow, not elsewhere  classified  Pain in right elbow  Rationale for Evaluation and Treatment: Rehabilitation  SUBJECTIVE:                                                                                                                                                                                             SUBJECTIVE STATEMENT:  Pt reports she is feeling good currently.  Pt notes that the last visit she was supposed to  be here, she had to cancel due feeling light headed.  Pt went to the MD for some of the tingling in the R thigh as well.   Pt accompanied by: self  PERTINENT HISTORY: Patient was seen by physical therapy until September of last year. Pmh includes MS, UTI, anxiety. Has extreme spasticity in LLE. Extreme cold and heat affect her LLE. Patient is now taking hormonal medication. Patient is a Chartered certified accountant.   PAIN:  Are you having pain? No  PRECAUTIONS: None  RED FLAGS: None   WEIGHT BEARING RESTRICTIONS: No  FALLS: Has patient fallen in last 6 months? No  LIVING ENVIRONMENT: Lives with: lives with their family Lives in: House/apartment Stairs: no stairs to enter, lives on second floor Has following equipment at home: Single point cane and shower chair  PLOF: Independent  PATIENT GOALS: continue walking as long as she can.   OBJECTIVE:  Note: Objective measures were completed at Evaluation unless otherwise noted.  LOWER EXTREMITY MMT:    MMT Right Eval Left Eval  Hip flexion 6.4 4.2  Hip extension    Hip abduction 7.9 3.4  Hip adduction 7.5 8.5  Hip internal rotation    Hip external rotation    Knee flexion 11.1 10.2  Knee extension 7.8 10  Ankle dorsiflexion 10.8 6.8  Ankle plantarflexion 7.2 6.9  Ankle inversion    Ankle eversion    (Blank rows = not tested)   FUNCTIONAL TESTS:  5 times sit to stand: 13 seconds with arms crossed.  6 minute walk test: next session  Functional gait assessment: 14/30  PATIENT SURVEYS:  ABC scale 60.5%                                                                                                                                TREATMENT DATE: 01/09/24    Vitals assessed:  BP: 127/74 mmHg HR: 70 bpm  Gait belt donned with CGA for all standing activities, thorough therapeutic rest breaks provided throughout session.   TherEx:   Seated ankle 4 way with RTB, 2x10 each direction, each LE  Seated LAQ 10# AW donned each LE, 2x10 Seated marches 10# AW donned each LE, 2x10  Seated calf raises, 10# AW donned each LE, 2x10  Seated leg press, 55#, 2x10   PATIENT EDUCATION: Education details: Exercise technique; importance of daily stretching Person educated: Patient Education method: Explanation, Demonstration, Tactile cues, and Verbal cues Education comprehension: verbalized understanding, returned demonstration, verbal cues required, and tactile cues required   HOME EXERCISE PROGRAM: Access Code: [email or web address] URL: [email or web address] Date: 08/13/2023 Prepared by: Reyes London  Exercises - Hooklying Single Knee to Chest Stretch  - 1-2 x daily - 3 sets - 30 sec  hold - Supine Lower Trunk Rotation  - 1-2 x daily - 3 sets - 30 sec hold - Supine Hamstring Stretch with Strap  - 1-2 x daily - 3 sets - 30 sec hold - Seated  Hamstring Stretch  - 1-2 x daily - 3 sets - 30 sec hold - Supine Hip Internal and External Rotation  - 1-2 x daily - 3 sets - 10 reps - Seated Hip External Rotation Stretch  - 1 x daily - 3 sets - 30 sec hold - Supine Figure 4 Piriformis Stretch  - 1 x daily - 3 sets - 30 sec hold  GOALS:  Goals reviewed with patient? Yes  SHORT TERM GOALS: Target date: 05/22/2023  Patient will be independent in home exercise program to improve strength/mobility for better functional independence with ADLs. Baseline: 4/14: compliant  Goal status: MET   LONG TERM GOALS: Target date: 12/31/2023  Patient will increase Functional Gait Assessment score to >25/30 as to reduce fall  risk and improve dynamic gait safety with community ambulation. Baseline: 1/28: 14/30   4/14: 17/30   6/11: 18/30  7/14: 20/30 12/05/23:    01/02/24: 20/30 Goal status: Partially Met  2.   Patient (< 57 years old) will complete five times sit to stand test in <10 seconds indicating an increased LE strength and improved balance. Baseline: 1/28: 13 seconds  4/14: 10.72 seconds no hands  6/11: 10 seconds no hands  12/05/23: 14.44 sec no hands 01/02/24: 11.71 sec hands free; 9.90sec Goal status: Re-Assessed  3.  Patient will increase six minute walk test distance to >1000 for progression to community ambulator and improve gait ability Baseline: Perform next session 3/4: 820 ft  4/14: 890 ft with [facility]   6/11: 888 ft with [facility]  7/14: 870 ft with SPC- only one instance of L foot catching on ground throughout test 12/05/23: 818 ft with SPC and new [employer] shoes 01/02/24: 988ft c SPC and cadence  Goal status: Partially Met   4.  Patient will return to nightly ambulations with less than five foot scuffs per ambulation to return to PLOF.  Baseline: 1/28: limited ambulation  4/14: some limitations pending on day. Encouraged to perform multiple short walks  6/11: started doing nightly walks ; decreased foot scuffs   7/14: pt reports no scuffs Goal status: MET  5. Patient will increase ABC scale score >80% to demonstrate better functional mobility and better confidence with ADLs.  Baseline: 1/28: 60%   4/14: 65%   6/11: 68%  7/14: 59.38% 12/05/23: 56.88% 01/02/24: pending Goal status: Partially Met  6.  Pt will complete 30 minutes of low-impact cardio exercise (such as stationary cycling) 3 times per week for the next 6 weeks, gradually increasing the duration by 5 minutes every 2 weeks, to improve my cardiovascular endurance while monitoring for signs of fatigue or overheating. Baseline: 10/22/23: pt able to achieve 1 bout of 20 min in a week 12/05/23: Pt able to achieve 2 bouts of 25 minutes last  week.   Goal Status: NEW   ASSESSMENT:  CLINICAL IMPRESSION:    Pt responded well to the exercises, although did not want to push it due to the recent lightheadedness that she had been experiencing at the beginning of the week.  Pt has good vitals and were assessed throughout the session, and was able to tolerate the exercises without any complications.  Pt elected to not perform strenuous portion of the exercises and so lighter weight was utilized for this session only.   Pt will continue to benefit from skilled therapy to address remaining deficits in order to improve overall QoL and return to PLOF.      OBJECTIVE IMPAIRMENTS: Abnormal gait, decreased activity tolerance,  decreased balance, decreased coordination, decreased endurance, decreased mobility, difficulty walking, decreased strength, impaired perceived functional ability, impaired flexibility, impaired UE functional use, and improper body mechanics.   ACTIVITY LIMITATIONS: carrying, lifting, bending, sitting, standing, squatting, stairs, transfers, bed mobility, dressing, reach over head, hygiene/grooming, locomotion level, and caring for others  PARTICIPATION LIMITATIONS: meal prep, cleaning, laundry, interpersonal relationship, driving, shopping, community activity, and occupation  PERSONAL FACTORS: Age, Fitness, Past/current experiences, Profession, Time since onset of injury/illness/exacerbation, and 1-2 comorbidities: MS anxiety are also affecting patient's functional outcome.  REHAB POTENTIAL: Good CLINICAL DECISION MAKING: Evolving/moderate complexity EVALUATION COMPLEXITY: Moderate  PLAN:  PT FREQUENCY: 2x/week PT DURATION: 12 weeks PLANNED INTERVENTIONS: 97164- PT Re-evaluation, 97110-Therapeutic exercises, 97530- Therapeutic activity, 97112- Neuromuscular re-education, 97535- Self Care, 02859- Manual therapy, [phone]- Gait training, [phone]- Orthotic Fit/training, [phone]- Canalith repositioning, Patient/Family education,  Balance training, Stair training, Taping, Dry Needling, Joint mobilization, Joint manipulation, Spinal mobilization, Vestibular training, Visual/preceptual remediation/compensation, DME instructions, Cryotherapy, and Moist heat  PLAN FOR NEXT SESSION:    airex pad/balance, LLE strengthening, HEP , LLE coordination. Adding cognitive/manual dual task with dynamic balance activities     Fonda Simpers, PT, DPT Physical Therapist - [facility]  01/09/24, 2:18 PM

## 2024-01-14 ENCOUNTER — Ambulatory Visit

## 2024-01-14 DIAGNOSIS — R2681 Unsteadiness on feet: Secondary | ICD-10-CM

## 2024-01-14 DIAGNOSIS — M6281 Muscle weakness (generalized): Secondary | ICD-10-CM

## 2024-01-14 DIAGNOSIS — Z9181 History of falling: Secondary | ICD-10-CM

## 2024-01-14 DIAGNOSIS — M25521 Pain in right elbow: Secondary | ICD-10-CM

## 2024-01-14 DIAGNOSIS — R262 Difficulty in walking, not elsewhere classified: Secondary | ICD-10-CM

## 2024-01-14 DIAGNOSIS — M25621 Stiffness of right elbow, not elsewhere classified: Secondary | ICD-10-CM

## 2024-01-14 DIAGNOSIS — R269 Unspecified abnormalities of gait and mobility: Secondary | ICD-10-CM

## 2024-01-14 NOTE — Therapy (Addendum)
 OUTPATIENT PHYSICAL THERAPY TREATMENT  Patient Name: Michelle Ruiz MRN: [id number] DOB:1970/10/13, 53 y.o., female Today's Date: 01/14/2024  PCP: Sherial Bail  REFERRING PROVIDER: Maree Hila K  END OF SESSION:   PT End of Session - 01/14/24 1321     Visit Number 49    Number of Visits 51    Date for Recertification  02/27/24    Progress Note Due on Visit 50    PT Start Time 1317    PT Stop Time 1400    PT Time Calculation (min) 43 min    Equipment Utilized During Treatment Gait belt    Activity Tolerance Patient tolerated treatment well;No increased pain    Behavior During Therapy WFL for tasks assessed/performed         Past Medical History:  Diagnosis Date   Abnormal Pap smear of cervix    Actinic keratosis 09/17/2023   R spinal mid upper back   Anxiety    Basal cell carcinoma 04/14/2008   Right upper ant. thigh. BCC with sclerosis.   Basal cell carcinoma 08/14/2008   Right lat. lower thigh. Superficial.    Basal cell carcinoma 04/29/2019   Right mid forearm. Superficial and nodular patterns. EDC.   Basal cell carcinoma 08/17/2020   Right upper antecubital. EDC.   Basal cell carcinoma 08/23/2021   Left Lower Leg, EDC   Basal cell carcinoma 09/17/2023   L medial lower leg, EDC   Kidney stones    MS (multiple sclerosis)    UTI (urinary tract infection)    Past Surgical History:  Procedure Laterality Date   BREAST BIOPSY Right 2010   stereotactic biopsy/ neg/ dr.byrnett   BREAST BIOPSY Left 2012   neg/dr. brynett   CESAREAN SECTION  2008   RPH   TONSILLECTOMY  2006   There are no active problems to display for this patient.  ONSET DATE: diagnosed 12 years ago.   REFERRING DIAG: MS, gait and mobility   THERAPY DIAG:  Difficulty in walking, not elsewhere classified  Abnormality of gait and mobility  Unsteadiness on feet  Muscle weakness (generalized)  History of falling  Stiffness of right elbow, not elsewhere classified  Pain in  right elbow  Rationale for Evaluation and Treatment: Rehabilitation  SUBJECTIVE:                                                                                                                                                                                             SUBJECTIVE STATEMENT:  Pt reports that her puppy is fully vaccinated and can go outside for walks.  Pt notes she took the dog  for 2 walks over the weekend and didn't have any issues.    Pt accompanied by: self  PERTINENT HISTORY: Patient was seen by physical therapy until September of last year. Pmh includes MS, UTI, anxiety. Has extreme spasticity in LLE. Extreme cold and heat affect her LLE. Patient is now taking hormonal medication. Patient is a Chartered certified accountant.   PAIN:  Are you having pain? No  PRECAUTIONS: None  RED FLAGS: None   WEIGHT BEARING RESTRICTIONS: No  FALLS: Has patient fallen in last 6 months? No  LIVING ENVIRONMENT: Lives with: lives with their family Lives in: House/apartment Stairs: no stairs to enter, lives on second floor Has following equipment at home: Single point cane and shower chair  PLOF: Independent  PATIENT GOALS: continue walking as long as she can.   OBJECTIVE:  Note: Objective measures were completed at Evaluation unless otherwise noted.  LOWER EXTREMITY MMT:    MMT Right Eval Left Eval  Hip flexion 6.4 4.2  Hip extension    Hip abduction 7.9 3.4  Hip adduction 7.5 8.5  Hip internal rotation    Hip external rotation    Knee flexion 11.1 10.2  Knee extension 7.8 10  Ankle dorsiflexion 10.8 6.8  Ankle plantarflexion 7.2 6.9  Ankle inversion    Ankle eversion    (Blank rows = not tested)   FUNCTIONAL TESTS:  5 times sit to stand: 13 seconds with arms crossed.  6 minute walk test: next session  Functional gait assessment: 14/30  PATIENT SURVEYS:  ABC scale 60.5%                                                                                                                                TREATMENT DATE: 01/14/24     Gait belt donned with CGA for all standing activities, thorough therapeutic rest breaks provided throughout session.  Gait:  Patient ambulated 24 min indoor/outdoor today- including tile, concrete, carpet, horizontal head turns, on varied surfaces: concrete, brick, asphalt, mulch, grass, up ramp, inclines, declines- with 0 seated rest break(s) - CGA for all indoor/outdoor - 0 instance of instability when ambulating over mulch - Focusing on overall endurance and to improve ambulation across uneven terrain that pt would come in contact with in the community.   Neuro:  The patient participated in the [facility] Balance System's Color cognitive training module. This activity requires the patient to stabilize on a platform on 3.0 stability level while patient reaches forward and taps a colored box in a grid of PT choice.  The grid is made of 6 horizontal bars and 4 vertical bars. The color chosen for the task is purple. The task is designed to challenge and improve the patient's reaction time, attention, cognition, reach, dual task, and postural control.  Performance was monitored to track progress and guide ongoing intervention. Patient performed for 90 seconds with ignore balance off.  3 total bouts performed   Patient participated in  balance training using the [employer] game on the [employer] platform for 8 minutes at stability level 3.0. The task involved navigating a digital ball across a screen and shifting the center of gravity on a dynamic platform in order to break bricks. This activity targets postural control, weight shifting, proprioception, and visual-motor coordination, and promotes core stability and reactive balance strategies. The patient required minimal level of assistance and moderate verbal cueing.      PATIENT EDUCATION: Education details: Exercise technique; importance of daily stretching Person educated:  Patient Education method: Explanation, Demonstration, Tactile cues, and Verbal cues Education comprehension: verbalized understanding, returned demonstration, verbal cues required, and tactile cues required   HOME EXERCISE PROGRAM: Access Code: [email or web address] URL: [email or web address] Date: 08/13/2023 Prepared by: Reyes London  Exercises - Hooklying Single Knee to Chest Stretch  - 1-2 x daily - 3 sets - 30 sec  hold - Supine Lower Trunk Rotation  - 1-2 x daily - 3 sets - 30 sec hold - Supine Hamstring Stretch with Strap  - 1-2 x daily - 3 sets - 30 sec hold - Seated Hamstring Stretch  - 1-2 x daily - 3 sets - 30 sec hold - Supine Hip Internal and External Rotation  - 1-2 x daily - 3 sets - 10 reps - Seated Hip External Rotation Stretch  - 1 x daily - 3 sets - 30 sec hold - Supine Figure 4 Piriformis Stretch  - 1 x daily - 3 sets - 30 sec hold  GOALS:  Goals reviewed with patient? Yes  SHORT TERM GOALS: Target date: 05/22/2023  Patient will be independent in home exercise program to improve strength/mobility for better functional independence with ADLs. Baseline: 4/14: compliant  Goal status: MET   LONG TERM GOALS: Target date: 12/31/2023  Patient will increase Functional Gait Assessment score to >25/30 as to reduce fall risk and improve dynamic gait safety with community ambulation. Baseline: 1/28: 14/30   4/14: 17/30   6/11: 18/30  7/14: 20/30 12/05/23:    01/02/24: 20/30 Goal status: Partially Met  2.   Patient (< 24 years old) will complete five times sit to stand test in <10 seconds indicating an increased LE strength and improved balance. Baseline: 1/28: 13 seconds  4/14: 10.72 seconds no hands  6/11: 10 seconds no hands  12/05/23: 14.44 sec no hands 01/02/24: 11.71 sec hands free; 9.90sec Goal status: Re-Assessed  3.  Patient will increase six minute walk test distance to >1000 for progression to community ambulator and improve gait ability Baseline:  Perform next session 3/4: 820 ft  4/14: 890 ft with [facility]   6/11: 888 ft with [facility]  7/14: 870 ft with SPC- only one instance of L foot catching on ground throughout test 12/05/23: 818 ft with SPC and new Cadense shoes 01/02/24: 969ft c SPC and cadence  Goal status: Partially Met   4.  Patient will return to nightly ambulations with less than five foot scuffs per ambulation to return to PLOF.  Baseline: 1/28: limited ambulation  4/14: some limitations pending on day. Encouraged to perform multiple short walks  6/11: started doing nightly walks ; decreased foot scuffs   7/14: pt reports no scuffs Goal status: MET  5. Patient will increase ABC scale score >80% to demonstrate better functional mobility and better confidence with ADLs.  Baseline: 1/28: 60%   4/14: 65%   6/11: 68%  7/14: 59.38% 12/05/23: 56.88% 01/02/24: pending Goal status: Partially Met  6.  Pt will complete 30  minutes of low-impact cardio exercise (such as stationary cycling) 3 times per week for the next 6 weeks, gradually increasing the duration by 5 minutes every 2 weeks, to improve my cardiovascular endurance while monitoring for signs of fatigue or overheating. Baseline: 10/22/23: pt able to achieve 1 bout of 20 min in a week 12/05/23: Pt able to achieve 2 bouts of 25 minutes last week.   Goal Status: NEW   ASSESSMENT:  CLINICAL IMPRESSION:    Pt put forth great effort throughout the session and was able to ambulate longer distance without the cane, however pt still voicing unsteadiness.  Pt is able to ambulate better and even notes that the more consistent she is with ambulating outside of the clinic, the easier it becomes.  Pt encouraged to continue ambulation attempts at home and to improve overall endurance levels.   Pt will continue to benefit from skilled therapy to address remaining deficits in order to improve overall QoL and return to PLOF.       OBJECTIVE IMPAIRMENTS: Abnormal gait, decreased activity  tolerance, decreased balance, decreased coordination, decreased endurance, decreased mobility, difficulty walking, decreased strength, impaired perceived functional ability, impaired flexibility, impaired UE functional use, and improper body mechanics.   ACTIVITY LIMITATIONS: carrying, lifting, bending, sitting, standing, squatting, stairs, transfers, bed mobility, dressing, reach over head, hygiene/grooming, locomotion level, and caring for others  PARTICIPATION LIMITATIONS: meal prep, cleaning, laundry, interpersonal relationship, driving, shopping, community activity, and occupation  PERSONAL FACTORS: Age, Fitness, Past/current experiences, Profession, Time since onset of injury/illness/exacerbation, and 1-2 comorbidities: MS anxiety are also affecting patient's functional outcome.  REHAB POTENTIAL: Good CLINICAL DECISION MAKING: Evolving/moderate complexity EVALUATION COMPLEXITY: Moderate  PLAN:  PT FREQUENCY: 2x/week PT DURATION: 12 weeks PLANNED INTERVENTIONS: 97164- PT Re-evaluation, 97110-Therapeutic exercises, 97530- Therapeutic activity, 97112- Neuromuscular re-education, 97535- Self Care, 02859- Manual therapy, [phone]- Gait training, [phone]- Orthotic Fit/training, [phone]- Canalith repositioning, Patient/Family education, Balance training, Stair training, Taping, Dry Needling, Joint mobilization, Joint manipulation, Spinal mobilization, Vestibular training, Visual/preceptual remediation/compensation, DME instructions, Cryotherapy, and Moist heat  PLAN FOR NEXT SESSION:     airex pad/balance, LLE strengthening, HEP , LLE coordination. Adding cognitive/manual dual task with dynamic balance activities     Fonda Simpers, PT, DPT Physical Therapist - [facility]  01/14/24, 6:04 PM

## 2024-01-16 ENCOUNTER — Ambulatory Visit

## 2024-01-16 DIAGNOSIS — R269 Unspecified abnormalities of gait and mobility: Secondary | ICD-10-CM

## 2024-01-16 DIAGNOSIS — R2681 Unsteadiness on feet: Secondary | ICD-10-CM

## 2024-01-16 DIAGNOSIS — R262 Difficulty in walking, not elsewhere classified: Secondary | ICD-10-CM | POA: Diagnosis not present

## 2024-01-16 DIAGNOSIS — Z9181 History of falling: Secondary | ICD-10-CM

## 2024-01-16 DIAGNOSIS — M25521 Pain in right elbow: Secondary | ICD-10-CM

## 2024-01-16 DIAGNOSIS — M25621 Stiffness of right elbow, not elsewhere classified: Secondary | ICD-10-CM

## 2024-01-16 DIAGNOSIS — M6281 Muscle weakness (generalized): Secondary | ICD-10-CM

## 2024-01-16 NOTE — Therapy (Signed)
 OUTPATIENT PHYSICAL THERAPY TREATMENT/PHYSICAL THERAPY PROGRESS NOTE   Dates of reporting period  12/05/23   to   01/16/24   Patient Name: Michelle Ruiz MRN: [id number] DOB:1970-07-07, 52 y.o., female Today's Date: 01/16/2024  PCP: Sherial Bail  REFERRING PROVIDER: Maree Hila K   END OF SESSION:   PT End of Session - 01/16/24 1325     Visit Number 50    Number of Visits 60    Date for Recertification  02/27/24    Progress Note Due on Visit 50    PT Start Time 1316    PT Stop Time 1400    PT Time Calculation (min) 44 min    Equipment Utilized During Treatment Gait belt    Activity Tolerance Patient tolerated treatment well;No increased pain    Behavior During Therapy WFL for tasks assessed/performed          Past Medical History:  Diagnosis Date   Abnormal Pap smear of cervix    Actinic keratosis 09/17/2023   R spinal mid upper back   Anxiety    Basal cell carcinoma 04/14/2008   Right upper ant. thigh. BCC with sclerosis.   Basal cell carcinoma 08/14/2008   Right lat. lower thigh. Superficial.    Basal cell carcinoma 04/29/2019   Right mid forearm. Superficial and nodular patterns. EDC.   Basal cell carcinoma 08/17/2020   Right upper antecubital. EDC.   Basal cell carcinoma 08/23/2021   Left Lower Leg, EDC   Basal cell carcinoma 09/17/2023   L medial lower leg, EDC   Kidney stones    MS (multiple sclerosis)    UTI (urinary tract infection)    Past Surgical History:  Procedure Laterality Date   BREAST BIOPSY Right 2010   stereotactic biopsy/ neg/ dr.byrnett   BREAST BIOPSY Left 2012   neg/dr. brynett   CESAREAN SECTION  2008   RPH   TONSILLECTOMY  2006   There are no active problems to display for this patient.  ONSET DATE: diagnosed 12 years ago.   REFERRING DIAG: MS, gait and mobility   THERAPY DIAG:  Difficulty in walking, not elsewhere classified  Abnormality of gait and mobility  Unsteadiness on feet  Muscle weakness  (generalized)  History of falling  Stiffness of right elbow, not elsewhere classified  Pain in right elbow  Rationale for Evaluation and Treatment: Rehabilitation  SUBJECTIVE:                                                                                                                                                                                             SUBJECTIVE STATEMENT:  Pt  reports that her husband threw out his back and she has been having to take care of the housework over the past couple of days.  Pt notes that she is having to take care of the puppy now, and she's worried about being too fatigued to perform tasks around the house following therapy.  Pt accompanied by: self  PERTINENT HISTORY: Patient was seen by physical therapy until September of last year. Pmh includes MS, UTI, anxiety. Has extreme spasticity in LLE. Extreme cold and heat affect her LLE. Patient is now taking hormonal medication. Patient is a Chartered certified accountant.   PAIN:  Are you having pain? No  PRECAUTIONS: None  RED FLAGS: None   WEIGHT BEARING RESTRICTIONS: No  FALLS: Has patient fallen in last 6 months? No  LIVING ENVIRONMENT: Lives with: lives with their family Lives in: House/apartment Stairs: no stairs to enter, lives on second floor Has following equipment at home: Single point cane and shower chair  PLOF: Independent  PATIENT GOALS: continue walking as long as she can.   OBJECTIVE:  Note: Objective measures were completed at Evaluation unless otherwise noted.  LOWER EXTREMITY MMT:    MMT Right Eval Left Eval  Hip flexion 6.4 4.2  Hip extension    Hip abduction 7.9 3.4  Hip adduction 7.5 8.5  Hip internal rotation    Hip external rotation    Knee flexion 11.1 10.2  Knee extension 7.8 10  Ankle dorsiflexion 10.8 6.8  Ankle plantarflexion 7.2 6.9  Ankle inversion    Ankle eversion    (Blank rows = not tested)   FUNCTIONAL TESTS:  5 times sit to stand: 13  seconds with arms crossed.  6 minute walk test: next session  Functional gait assessment: 14/30  PATIENT SURVEYS:  ABC scale 60.5%                                                                                                                               TREATMENT DATE: 01/16/24    Gait belt donned with CGA for all standing activities, thorough therapeutic rest breaks provided throughout session.   TherAct:  The patient completed 10 minutes at level(s) 3-8 on the [facility] using both BUE/BLE reciprocal movements to promote strength, endurance, and cardiorespiratory fitness. PT increased the resistance level and monitored the patient's response to the intervention throughout. The patient required min cueing for technique and supervision level of assistance.   TherEx:  Seated leg press, 85#, 2x10    Neuro:  Static stance on Airex pad, 30 sec bouts Static stance on Airex pad, eyes closed 30 sec bouts  Staggered stance with one foot on airex pad, one foot trailing, 45 sec bouts x3 each LE leading  Lateral weight shifts on wobble board, 2x10 each side     PATIENT EDUCATION: Education details: Exercise technique; importance of daily stretching Person educated: Patient Education method: Explanation, Demonstration, Tactile cues, and Verbal cues Education comprehension: verbalized  understanding, returned demonstration, verbal cues required, and tactile cues required   HOME EXERCISE PROGRAM: Access Code: [email or web address] URL: [email or web address] Date: 08/13/2023 Prepared by: Reyes London  Exercises - Hooklying Single Knee to Chest Stretch  - 1-2 x daily - 3 sets - 30 sec  hold - Supine Lower Trunk Rotation  - 1-2 x daily - 3 sets - 30 sec hold - Supine Hamstring Stretch with Strap  - 1-2 x daily - 3 sets - 30 sec hold - Seated Hamstring Stretch  - 1-2 x daily - 3 sets - 30 sec hold - Supine Hip Internal and External Rotation  - 1-2 x daily - 3  sets - 10 reps - Seated Hip External Rotation Stretch  - 1 x daily - 3 sets - 30 sec hold - Supine Figure 4 Piriformis Stretch  - 1 x daily - 3 sets - 30 sec hold  GOALS:  Goals reviewed with patient? Yes  SHORT TERM GOALS: Target date: 05/22/2023  Patient will be independent in home exercise program to improve strength/mobility for better functional independence with ADLs. Baseline: 4/14: compliant  Goal status: MET   LONG TERM GOALS: Target date: 12/31/2023  Patient will increase Functional Gait Assessment score to >25/30 as to reduce fall risk and improve dynamic gait safety with community ambulation. Baseline: 1/28: 14/30   4/14: 17/30   6/11: 18/30  7/14: 20/30 12/05/23:    01/02/24: 20/30 Goal status: Partially Met  2.   Patient (< 1 years old) will complete five times sit to stand test in <10 seconds indicating an increased LE strength and improved balance. Baseline: 1/28: 13 seconds  4/14: 10.72 seconds no hands  6/11: 10 seconds no hands  12/05/23: 14.44 sec no hands 01/02/24: 11.71 sec hands free; 9.90sec Goal status: Re-Assessed  3.  Patient will increase six minute walk test distance to >1000 for progression to community ambulator and improve gait ability Baseline: Perform next session 3/4: 820 ft  4/14: 890 ft with [facility]   6/11: 888 ft with [facility]  7/14: 870 ft with SPC- only one instance of L foot catching on ground throughout test 12/05/23: 818 ft with SPC and new [employer]ense shoes 01/02/24: 931ft c SPC and cadence  Goal status: Partially Met   4.  Patient will return to nightly ambulations with less than five foot scuffs per ambulation to return to PLOF.  Baseline: 1/28: limited ambulation  4/14: some limitations pending on day. Encouraged to perform multiple short walks  6/11: started doing nightly walks ; decreased foot scuffs   7/14: pt reports no scuffs Goal status: MET  5. Patient will increase ABC scale score >80% to demonstrate better functional mobility and  better confidence with ADLs.  Baseline: 1/28: 60%   4/14: 65%   6/11: 68%  7/14: 59.38% 12/05/23: 56.88% 01/02/24: pending Goal status: Partially Met  6.  Pt will complete 30 minutes of low-impact cardio exercise (such as stationary cycling) 3 times per week for the next 6 weeks, gradually increasing the duration by 5 minutes every 2 weeks, to improve my cardiovascular endurance while monitoring for signs of fatigue or overheating. Baseline: 10/22/23: pt able to achieve 1 bout of 20 min in a week 12/05/23: Pt able to achieve 2 bouts of 25 minutes last week.   Goal Status: NEW   ASSESSMENT:  CLINICAL IMPRESSION:    Goals previously assessed recently for re-certification and based on the continued progress and maintenance, pt would continue to benefit from skilled therapy  to address symptoms still present.  Pt is making improvements with endurance levels and has been walking her dog more frequently in large due to her confidence level being higher.  Patient's condition has the potential to improve in response to therapy. Maximum improvement is yet to be obtained. The anticipated improvement is attainable and reasonable in a generally predictable time.  [id number]      OBJECTIVE IMPAIRMENTS: Abnormal gait, decreased activity tolerance, decreased balance, decreased coordination, decreased endurance, decreased mobility, difficulty walking, decreased strength, impaired perceived functional ability, impaired flexibility, impaired UE functional use, and improper body mechanics.   ACTIVITY LIMITATIONS: carrying, lifting, bending, sitting, standing, squatting, stairs, transfers, bed mobility, dressing, reach over head, hygiene/grooming, locomotion level, and caring for others  PARTICIPATION LIMITATIONS: meal prep, cleaning, laundry, interpersonal relationship, driving, shopping, community activity, and occupation  PERSONAL FACTORS: Age, Fitness, Past/current experiences, Profession, Time since onset of  injury/illness/exacerbation, and 1-2 comorbidities: MS anxiety are also affecting patient's functional outcome.  REHAB POTENTIAL: Good CLINICAL DECISION MAKING: Evolving/moderate complexity EVALUATION COMPLEXITY: Moderate  PLAN:  PT FREQUENCY: 2x/week PT DURATION: 12 weeks PLANNED INTERVENTIONS: 97164- PT Re-evaluation, 97110-Therapeutic exercises, 97530- Therapeutic activity, 97112- Neuromuscular re-education, 97535- Self Care, 02859- Manual therapy, [phone]- Gait training, [phone]- Orthotic Fit/training, [phone]- Canalith repositioning, Patient/Family education, Balance training, Stair training, Taping, Dry Needling, Joint mobilization, Joint manipulation, Spinal mobilization, Vestibular training, Visual/preceptual remediation/compensation, DME instructions, Cryotherapy, and Moist heat  PLAN FOR NEXT SESSION:    airex pad/balance, LLE strengthening, HEP , LLE coordination. Adding cognitive/manual dual task with dynamic balance activities     Fonda Simpers, PT, DPT Physical Therapist - [facility]  01/16/24, 1:30 PM

## 2024-01-21 ENCOUNTER — Ambulatory Visit

## 2024-01-22 ENCOUNTER — Ambulatory Visit

## 2024-01-23 ENCOUNTER — Ambulatory Visit

## 2024-01-23 DIAGNOSIS — R2681 Unsteadiness on feet: Secondary | ICD-10-CM

## 2024-01-23 DIAGNOSIS — R262 Difficulty in walking, not elsewhere classified: Secondary | ICD-10-CM | POA: Diagnosis not present

## 2024-01-23 DIAGNOSIS — R269 Unspecified abnormalities of gait and mobility: Secondary | ICD-10-CM

## 2024-01-23 DIAGNOSIS — Z9181 History of falling: Secondary | ICD-10-CM

## 2024-01-23 DIAGNOSIS — M25521 Pain in right elbow: Secondary | ICD-10-CM

## 2024-01-23 DIAGNOSIS — M6281 Muscle weakness (generalized): Secondary | ICD-10-CM

## 2024-01-23 DIAGNOSIS — M25621 Stiffness of right elbow, not elsewhere classified: Secondary | ICD-10-CM

## 2024-01-23 NOTE — Therapy (Signed)
 OUTPATIENT PHYSICAL THERAPY TREATMENT  Patient Name: Michelle Ruiz MRN: [id number] DOB:1970/09/01, 53 y.o., female Today's Date: 01/23/2024  PCP: Sherial Bail  REFERRING PROVIDER: Maree Hila K   END OF SESSION:   PT End of Session - 01/23/24 1622     Visit Number 51    Number of Visits 60    Date for Recertification  02/27/24    Progress Note Due on Visit 50    PT Start Time 1618    PT Stop Time 1700    PT Time Calculation (min) 42 min    Equipment Utilized During Treatment Gait belt    Activity Tolerance Patient tolerated treatment well;No increased pain    Behavior During Therapy WFL for tasks assessed/performed          Past Medical History:  Diagnosis Date   Abnormal Pap smear of cervix    Actinic keratosis 09/17/2023   R spinal mid upper back   Anxiety    Basal cell carcinoma 04/14/2008   Right upper ant. thigh. BCC with sclerosis.   Basal cell carcinoma 08/14/2008   Right lat. lower thigh. Superficial.    Basal cell carcinoma 04/29/2019   Right mid forearm. Superficial and nodular patterns. EDC.   Basal cell carcinoma 08/17/2020   Right upper antecubital. EDC.   Basal cell carcinoma 08/23/2021   Left Lower Leg, EDC   Basal cell carcinoma 09/17/2023   L medial lower leg, EDC   Kidney stones    MS (multiple sclerosis)    UTI (urinary tract infection)    Past Surgical History:  Procedure Laterality Date   BREAST BIOPSY Right 2010   stereotactic biopsy/ neg/ dr.byrnett   BREAST BIOPSY Left 2012   neg/dr. brynett   CESAREAN SECTION  2008   RPH   TONSILLECTOMY  2006   There are no active problems to display for this patient.  ONSET DATE: diagnosed 12 years ago.   REFERRING DIAG: MS, gait and mobility   THERAPY DIAG:  Difficulty in walking, not elsewhere classified  Abnormality of gait and mobility  Unsteadiness on feet  Muscle weakness (generalized)  History of falling  Stiffness of right elbow, not elsewhere classified  Pain in  right elbow  Rationale for Evaluation and Treatment: Rehabilitation  SUBJECTIVE:                                                                                                                                                                                             SUBJECTIVE STATEMENT:  Pt reports that she had a day at work, noting that people were bugging her a lot.  Pt accompanied by: self  PERTINENT HISTORY: Patient was seen by physical therapy until September of last year. Pmh includes MS, UTI, anxiety. Has extreme spasticity in LLE. Extreme cold and heat affect her LLE. Patient is now taking hormonal medication. Patient is a chartered certified accountant.   PAIN:  Are you having pain? No  PRECAUTIONS: None  RED FLAGS: None   WEIGHT BEARING RESTRICTIONS: No  FALLS: Has patient fallen in last 6 months? No  LIVING ENVIRONMENT: Lives with: lives with their family Lives in: House/apartment Stairs: no stairs to enter, lives on second floor Has following equipment at home: Single point cane and shower chair  PLOF: Independent  PATIENT GOALS: continue walking as long as she can.   OBJECTIVE:  Note: Objective measures were completed at Evaluation unless otherwise noted.  LOWER EXTREMITY MMT:    MMT Right Eval Left Eval  Hip flexion 6.4 4.2  Hip extension    Hip abduction 7.9 3.4  Hip adduction 7.5 8.5  Hip internal rotation    Hip external rotation    Knee flexion 11.1 10.2  Knee extension 7.8 10  Ankle dorsiflexion 10.8 6.8  Ankle plantarflexion 7.2 6.9  Ankle inversion    Ankle eversion    (Blank rows = not tested)   FUNCTIONAL TESTS:  5 times sit to stand: 13 seconds with arms crossed.  6 minute walk test: next session  Functional gait assessment: 14/30  PATIENT SURVEYS:  ABC scale 60.5%                                                                                                                               TREATMENT DATE: 01/23/24     Gait belt  donned with CGA for all standing activities, thorough therapeutic rest breaks provided throughout session.   TherAct:  Ambulation to the cancer center, to the cardiac rehab area, and then back to the gym without AD, CGA for safety with verbal cues for heel strike and foot clearance while maintaining stride length.    Neuro:  The [facility] Focus setting was selected to refine precision and concentration, isolating specific muscle groups or movements to enhance overall coordination and targeted muscle engagement.  Activity Description: Blaze pods are set up in the corner of the gym with 3 pods up on mirrors, and 3 pods on floor, with separation/spaced out to make the pt ambulate to the different pods.  Pt only able to hit the pink pods.  On the first attempt, pt performed without any difficulty, other than the occasional LOB that she is able to self-correct.  The second attempt, hedgehogs were placed sporadically on the ground as obstacles and pt had to navigate these while also moving to the pods.   Activity Setting:  FOCUS Number of Pods:  6 Cycles/Sets:  2 cycles/sets Duration (Time or Hit Count):  30 hits each attempt       PATIENT EDUCATION: Education details: Exercise technique; importance  of daily stretching Person educated: Patient Education method: Explanation, Demonstration, Tactile cues, and Verbal cues Education comprehension: verbalized understanding, returned demonstration, verbal cues required, and tactile cues required   HOME EXERCISE PROGRAM: Access Code: [email or web address] URL: [email or web address] Date: 08/13/2023 Prepared by: Reyes London  Exercises - Hooklying Single Knee to Chest Stretch  - 1-2 x daily - 3 sets - 30 sec  hold - Supine Lower Trunk Rotation  - 1-2 x daily - 3 sets - 30 sec hold - Supine Hamstring Stretch with Strap  - 1-2 x daily - 3 sets - 30 sec hold - Seated Hamstring Stretch  - 1-2 x daily - 3 sets - 30 sec hold - Supine Hip  Internal and External Rotation  - 1-2 x daily - 3 sets - 10 reps - Seated Hip External Rotation Stretch  - 1 x daily - 3 sets - 30 sec hold - Supine Figure 4 Piriformis Stretch  - 1 x daily - 3 sets - 30 sec hold  GOALS:  Goals reviewed with patient? Yes  SHORT TERM GOALS: Target date: 05/22/2023  Patient will be independent in home exercise program to improve strength/mobility for better functional independence with ADLs. Baseline: 4/14: compliant  Goal status: MET   LONG TERM GOALS: Target date: 12/31/2023  Patient will increase Functional Gait Assessment score to >25/30 as to reduce fall risk and improve dynamic gait safety with community ambulation. Baseline: 1/28: 14/30   4/14: 17/30   6/11: 18/30  7/14: 20/30 12/05/23:    01/02/24: 20/30 Goal status: Partially Met  2.   Patient (< 58 years old) will complete five times sit to stand test in <10 seconds indicating an increased LE strength and improved balance. Baseline: 1/28: 13 seconds  4/14: 10.72 seconds no hands  6/11: 10 seconds no hands  12/05/23: 14.44 sec no hands 01/02/24: 11.71 sec hands free; 9.90sec Goal status: Re-Assessed  3.  Patient will increase six minute walk test distance to >1000 for progression to community ambulator and improve gait ability Baseline: Perform next session 3/4: 820 ft  4/14: 890 ft with [facility]   6/11: 888 ft with [facility]  7/14: 870 ft with SPC- only one instance of L foot catching on ground throughout test 12/05/23: 818 ft with SPC and new Cadense shoes 01/02/24: 947ft c SPC and cadence  Goal status: Partially Met   4.  Patient will return to nightly ambulations with less than five foot scuffs per ambulation to return to PLOF.  Baseline: 1/28: limited ambulation  4/14: some limitations pending on day. Encouraged to perform multiple short walks  6/11: started doing nightly walks ; decreased foot scuffs   7/14: pt reports no scuffs Goal status: MET  5. Patient will increase ABC scale score  >80% to demonstrate better functional mobility and better confidence with ADLs.  Baseline: 1/28: 60%   4/14: 65%   6/11: 68%  7/14: 59.38% 12/05/23: 56.88% 01/02/24: pending Goal status: Partially Met  6.  Pt will complete 30 minutes of low-impact cardio exercise (such as stationary cycling) 3 times per week for the next 6 weeks, gradually increasing the duration by 5 minutes every 2 weeks, to improve my cardiovascular endurance while monitoring for signs of fatigue or overheating. Baseline: 10/22/23: pt able to achieve 1 bout of 20 min in a week 12/05/23: Pt able to achieve 2 bouts of 25 minutes last week.   Goal Status: NEW   ASSESSMENT:  CLINICAL IMPRESSION:    Pt responded well to  the ambulation and the neuro based activities.  Pt is demonstrating improved gait mechanics during session and was able to safely navigate the obstacles placed in front of her during the blaze pods exercises.  Pt still with slowed gait during the session and noted some fatigue, but would benefit from continued endurance training and burst exercises to improve quicker movements.   Pt will continue to benefit from skilled therapy to address remaining deficits in order to improve overall QoL and return to PLOF.         OBJECTIVE IMPAIRMENTS: Abnormal gait, decreased activity tolerance, decreased balance, decreased coordination, decreased endurance, decreased mobility, difficulty walking, decreased strength, impaired perceived functional ability, impaired flexibility, impaired UE functional use, and improper body mechanics.   ACTIVITY LIMITATIONS: carrying, lifting, bending, sitting, standing, squatting, stairs, transfers, bed mobility, dressing, reach over head, hygiene/grooming, locomotion level, and caring for others  PARTICIPATION LIMITATIONS: meal prep, cleaning, laundry, interpersonal relationship, driving, shopping, community activity, and occupation  PERSONAL FACTORS: Age, Fitness, Past/current  experiences, Profession, Time since onset of injury/illness/exacerbation, and 1-2 comorbidities: MS anxiety are also affecting patient's functional outcome.  REHAB POTENTIAL: Good CLINICAL DECISION MAKING: Evolving/moderate complexity EVALUATION COMPLEXITY: Moderate  PLAN:  PT FREQUENCY: 2x/week PT DURATION: 12 weeks PLANNED INTERVENTIONS: 97164- PT Re-evaluation, 97110-Therapeutic exercises, 97530- Therapeutic activity, 97112- Neuromuscular re-education, 97535- Self Care, 02859- Manual therapy, [phone]- Gait training, [phone]- Orthotic Fit/training, [phone]- Canalith repositioning, Patient/Family education, Balance training, Stair training, Taping, Dry Needling, Joint mobilization, Joint manipulation, Spinal mobilization, Vestibular training, Visual/preceptual remediation/compensation, DME instructions, Cryotherapy, and Moist heat  PLAN FOR NEXT SESSION:    airex pad/balance, LLE strengthening, HEP , LLE coordination. Adding cognitive/manual dual task with dynamic balance activities     Fonda Simpers, PT, DPT Physical Therapist - [facility]  01/23/24, 5:22 PM

## 2024-01-28 ENCOUNTER — Ambulatory Visit

## 2024-01-30 ENCOUNTER — Ambulatory Visit: Attending: Neurology

## 2024-01-30 DIAGNOSIS — Z9181 History of falling: Secondary | ICD-10-CM | POA: Insufficient documentation

## 2024-01-30 DIAGNOSIS — M6281 Muscle weakness (generalized): Secondary | ICD-10-CM | POA: Insufficient documentation

## 2024-01-30 DIAGNOSIS — R2681 Unsteadiness on feet: Secondary | ICD-10-CM | POA: Diagnosis present

## 2024-01-30 DIAGNOSIS — M25521 Pain in right elbow: Secondary | ICD-10-CM | POA: Insufficient documentation

## 2024-01-30 DIAGNOSIS — R262 Difficulty in walking, not elsewhere classified: Secondary | ICD-10-CM | POA: Diagnosis present

## 2024-01-30 DIAGNOSIS — M25621 Stiffness of right elbow, not elsewhere classified: Secondary | ICD-10-CM | POA: Insufficient documentation

## 2024-01-30 DIAGNOSIS — R269 Unspecified abnormalities of gait and mobility: Secondary | ICD-10-CM | POA: Diagnosis present

## 2024-01-30 NOTE — Therapy (Signed)
 OUTPATIENT PHYSICAL THERAPY TREATMENT  Patient Name: Michelle Ruiz MRN: [id number] DOB:[birth year], 53 y.o., female Today's Date: 01/31/2024  PCP: Sherial Bail  REFERRING PROVIDER: Maree Hila K   END OF SESSION:   PT End of Session - 01/30/24 1325     Visit Number 52    Number of Visits 60    Date for Recertification  02/27/24    Progress Note Due on Visit 50    PT Start Time 1319    PT Stop Time 1400    PT Time Calculation (min) 41 min    Equipment Utilized During Treatment Gait belt    Activity Tolerance Patient tolerated treatment well;No increased pain    Behavior During Therapy WFL for tasks assessed/performed           Past Medical History:  Diagnosis Date   Abnormal Pap smear of cervix    Actinic keratosis 09/17/2023   R spinal mid upper back   Anxiety    Basal cell carcinoma 04/14/2008   Right upper ant. thigh. BCC with sclerosis.   Basal cell carcinoma 08/14/2008   Right lat. lower thigh. Superficial.    Basal cell carcinoma 04/29/2019   Right mid forearm. Superficial and nodular patterns. EDC.   Basal cell carcinoma 08/17/2020   Right upper antecubital. EDC.   Basal cell carcinoma 08/23/2021   Left Lower Leg, EDC   Basal cell carcinoma 09/17/2023   L medial lower leg, EDC   Kidney stones    MS (multiple sclerosis)    UTI (urinary tract infection)    Past Surgical History:  Procedure Laterality Date   BREAST BIOPSY Right 2010   stereotactic biopsy/ neg/ dr.byrnett   BREAST BIOPSY Left 2012   neg/dr. brynett   CESAREAN SECTION  2008   RPH   TONSILLECTOMY  2006   There are no active problems to display for this patient.  ONSET DATE: diagnosed 12 years ago.   REFERRING DIAG: MS, gait and mobility   THERAPY DIAG:  Difficulty in walking, not elsewhere classified  Abnormality of gait and mobility  Unsteadiness on feet  Muscle weakness (generalized)  History of falling  Stiffness of right elbow, not elsewhere classified  Pain  in right elbow  Rationale for Evaluation and Treatment: Rehabilitation  SUBJECTIVE:                                                                                                                                                                                             SUBJECTIVE STATEMENT:  Pt reports that she is having a good day today, but lacked eating a good breakfast.  Pt accompanied by: self  PERTINENT HISTORY: Patient was seen by physical therapy until September of last year. Pmh includes MS, UTI, anxiety. Has extreme spasticity in LLE. Extreme cold and heat affect her LLE. Patient is now taking hormonal medication. Patient is a chartered certified accountant.   PAIN:  Are you having pain? No  PRECAUTIONS: None  RED FLAGS: None   WEIGHT BEARING RESTRICTIONS: No  FALLS: Has patient fallen in last 6 months? No  LIVING ENVIRONMENT: Lives with: lives with their family Lives in: House/apartment Stairs: no stairs to enter, lives on second floor Has following equipment at home: Single point cane and shower chair  PLOF: Independent  PATIENT GOALS: continue walking as long as she can.   OBJECTIVE:  Note: Objective measures were completed at Evaluation unless otherwise noted.  LOWER EXTREMITY MMT:    MMT Right Eval Left Eval  Hip flexion 6.4 4.2  Hip extension    Hip abduction 7.9 3.4  Hip adduction 7.5 8.5  Hip internal rotation    Hip external rotation    Knee flexion 11.1 10.2  Knee extension 7.8 10  Ankle dorsiflexion 10.8 6.8  Ankle plantarflexion 7.2 6.9  Ankle inversion    Ankle eversion    (Blank rows = not tested)   FUNCTIONAL TESTS:  5 times sit to stand: 13 seconds with arms crossed.  6 minute walk test: next session  Functional gait assessment: 14/30  PATIENT SURVEYS:  ABC scale 60.5%                                                                                                                               TREATMENT DATE: 01/31/24    Gait  belt donned with CGA for all standing activities, thorough therapeutic rest breaks provided throughout session.   TherAct:  Aura carries with 15# KB's in each UE, lap around the gym, down EVS hallway, past cafeteria, and back to gym (160')  Briefcase carries with 15# KB's in one UE, lap around the gym, down EVS hallway, past cafeteria, and back to gym (160')  STS x10 with verbal cues for proper form   Neuro:  Static stance on incline board, CW, 30 sec bouts, x2 Static stance on incline board, CCW, 30 sec bouts, x2 Static stance on decline board, CW, 30 sec bouts, x2 Static stance on decline board, CCW, 30 sec bouts, x2  Static stance on Airex pad, 30 sec bouts Static stance on Airex pad, eyes closed 30 sec bouts Static NBOS on Airex pad, 30 sec bouts Static NBOS on Airex pad, eyes closed, 30 sec bouts Static staggered stance on Airex pad, 30 sec bouts Static staggered stance on Airex pad, eyes closed, 30 sec bouts  Backwards ambulation in therapy hallway, length of the hallway, x2     PATIENT EDUCATION: Education details: Exercise technique; importance of daily stretching Person educated: Patient Education method: Explanation, Demonstration, Tactile cues, and Verbal cues Education comprehension: verbalized understanding,  returned demonstration, verbal cues required, and tactile cues required   HOME EXERCISE PROGRAM: Access Code: [email or web address] URL: [email or web address] Date: 08/13/2023 Prepared by: Reyes London  Exercises - Hooklying Single Knee to Chest Stretch  - 1-2 x daily - 3 sets - 30 sec  hold - Supine Lower Trunk Rotation  - 1-2 x daily - 3 sets - 30 sec hold - Supine Hamstring Stretch with Strap  - 1-2 x daily - 3 sets - 30 sec hold - Seated Hamstring Stretch  - 1-2 x daily - 3 sets - 30 sec hold - Supine Hip Internal and External Rotation  - 1-2 x daily - 3 sets - 10 reps - Seated Hip External Rotation Stretch  - 1 x daily - 3 sets - 30 sec  hold - Supine Figure 4 Piriformis Stretch  - 1 x daily - 3 sets - 30 sec hold  GOALS:  Goals reviewed with patient? Yes  SHORT TERM GOALS: Target date: 05/22/2023  Patient will be independent in home exercise program to improve strength/mobility for better functional independence with ADLs. Baseline: 4/14: compliant  Goal status: MET   LONG TERM GOALS: Target date: 12/31/2023  Patient will increase Functional Gait Assessment score to >25/30 as to reduce fall risk and improve dynamic gait safety with community ambulation. Baseline: 1/28: 14/30   4/14: 17/30   6/11: 18/30  7/14: 20/30 12/05/23:    01/02/24: 20/30 Goal status: Partially Met  2.   Patient (< 68 years old) will complete five times sit to stand test in <10 seconds indicating an increased LE strength and improved balance. Baseline: 1/28: 13 seconds  4/14: 10.72 seconds no hands  6/11: 10 seconds no hands  12/05/23: 14.44 sec no hands 01/02/24: 11.71 sec hands free; 9.90sec Goal status: Re-Assessed  3.  Patient will increase six minute walk test distance to >1000 for progression to community ambulator and improve gait ability Baseline: Perform next session 3/4: 820 ft  4/14: 890 ft with [facility]   6/11: 888 ft with [facility]  7/14: 870 ft with SPC- only one instance of L foot catching on ground throughout test 12/05/23: 818 ft with SPC and new Cadense shoes 01/02/24: 930ft c SPC and cadence  Goal status: Partially Met   4.  Patient will return to nightly ambulations with less than five foot scuffs per ambulation to return to PLOF.  Baseline: 1/28: limited ambulation  4/14: some limitations pending on day. Encouraged to perform multiple short walks  6/11: started doing nightly walks ; decreased foot scuffs   7/14: pt reports no scuffs Goal status: MET  5. Patient will increase ABC scale score >80% to demonstrate better functional mobility and better confidence with ADLs.  Baseline: 1/28: 60%   4/14: 65%   6/11: 68%  7/14:  59.38% 12/05/23: 56.88% 01/02/24: pending Goal status: Partially Met  6.  Pt will complete 30 minutes of low-impact cardio exercise (such as stationary cycling) 3 times per week for the next 6 weeks, gradually increasing the duration by 5 minutes every 2 weeks, to improve my cardiovascular endurance while monitoring for signs of fatigue or overheating. Baseline: 10/22/23: pt able to achieve 1 bout of 20 min in a week 12/05/23: Pt able to achieve 2 bouts of 25 minutes last week.   Goal Status: NEW   ASSESSMENT:  CLINICAL IMPRESSION:    Pt continues to perform well and is making good progress with endurance training.  Pt continues to have increased difficulty with L LE  foot clearance when getting fatigued.  Pt will continue to be challenged by endurance training and strengthening in order to improve overall tolerance to exercises.   Pt will continue to benefit from skilled therapy to address remaining deficits in order to improve overall QoL and return to PLOF.         OBJECTIVE IMPAIRMENTS: Abnormal gait, decreased activity tolerance, decreased balance, decreased coordination, decreased endurance, decreased mobility, difficulty walking, decreased strength, impaired perceived functional ability, impaired flexibility, impaired UE functional use, and improper body mechanics.   ACTIVITY LIMITATIONS: carrying, lifting, bending, sitting, standing, squatting, stairs, transfers, bed mobility, dressing, reach over head, hygiene/grooming, locomotion level, and caring for others  PARTICIPATION LIMITATIONS: meal prep, cleaning, laundry, interpersonal relationship, driving, shopping, community activity, and occupation  PERSONAL FACTORS: Age, Fitness, Past/current experiences, Profession, Time since onset of injury/illness/exacerbation, and 1-2 comorbidities: MS anxiety are also affecting patient's functional outcome.  REHAB POTENTIAL: Good CLINICAL DECISION MAKING: Evolving/moderate  complexity EVALUATION COMPLEXITY: Moderate  PLAN:  PT FREQUENCY: 2x/week PT DURATION: 12 weeks PLANNED INTERVENTIONS: 97164- PT Re-evaluation, 97110-Therapeutic exercises, 97530- Therapeutic activity, 97112- Neuromuscular re-education, 97535- Self Care, 02859- Manual therapy, [phone]- Gait training, [phone]- Orthotic Fit/training, [phone]- Canalith repositioning, Patient/Family education, Balance training, Stair training, Taping, Dry Needling, Joint mobilization, Joint manipulation, Spinal mobilization, Vestibular training, Visual/preceptual remediation/compensation, DME instructions, Cryotherapy, and Moist heat  PLAN FOR NEXT SESSION:    airex pad/balance, LLE strengthening, HEP , LLE coordination. Adding cognitive/manual dual task with dynamic balance activities     Fonda Simpers, PT, DPT Physical Therapist - [facility]  [place]  01/31/24, 10:17 AM

## 2024-02-04 ENCOUNTER — Ambulatory Visit

## 2024-02-06 ENCOUNTER — Ambulatory Visit

## 2024-02-06 NOTE — Therapy (Incomplete)
 OUTPATIENT PHYSICAL THERAPY TREATMENT  Patient Name: Michelle Ruiz MRN: [id number] DOB:Dec 27, 1970, 53 y.o., female Today's Date: 02/06/2024  PCP: Sherial Bail  REFERRING PROVIDER: Maree Hila K   END OF SESSION:      Past Medical History:  Diagnosis Date   Abnormal Pap smear of cervix    Actinic keratosis 09/17/2023   R spinal mid upper back   Anxiety    Basal cell carcinoma 04/14/2008   Right upper ant. thigh. BCC with sclerosis.   Basal cell carcinoma 08/14/2008   Right lat. lower thigh. Superficial.    Basal cell carcinoma 04/29/2019   Right mid forearm. Superficial and nodular patterns. EDC.   Basal cell carcinoma 08/17/2020   Right upper antecubital. EDC.   Basal cell carcinoma 08/23/2021   Left Lower Leg, EDC   Basal cell carcinoma 09/17/2023   L medial lower leg, EDC   Kidney stones    MS (multiple sclerosis)    UTI (urinary tract infection)    Past Surgical History:  Procedure Laterality Date   BREAST BIOPSY Right 2010   stereotactic biopsy/ neg/ dr.byrnett   BREAST BIOPSY Left 2012   neg/dr. brynett   CESAREAN SECTION  2008   RPH   TONSILLECTOMY  2006   There are no active problems to display for this patient.  ONSET DATE: diagnosed 12 years ago.   REFERRING DIAG: MS, gait and mobility   THERAPY DIAG:  No diagnosis found.  Rationale for Evaluation and Treatment: Rehabilitation  SUBJECTIVE:                                                                                                                                                                                             SUBJECTIVE STATEMENT:  ***  Pt accompanied by: self  PERTINENT HISTORY: Patient was seen by physical therapy until September of last year. Pmh includes MS, UTI, anxiety. Has extreme spasticity in LLE. Extreme cold and heat affect her LLE. Patient is now taking hormonal medication. Patient is a chartered certified accountant.   PAIN:  Are you having pain?  No  PRECAUTIONS: None  RED FLAGS: None   WEIGHT BEARING RESTRICTIONS: No  FALLS: Has patient fallen in last 6 months? No  LIVING ENVIRONMENT: Lives with: lives with their family Lives in: House/apartment Stairs: no stairs to enter, lives on second floor Has following equipment at home: Single point cane and shower chair  PLOF: Independent  PATIENT GOALS: continue walking as long as she can.   OBJECTIVE:  Note: Objective measures were completed at Evaluation unless otherwise noted.  LOWER EXTREMITY MMT:    MMT Right Eval Left  Eval  Hip flexion 6.4 4.2  Hip extension    Hip abduction 7.9 3.4  Hip adduction 7.5 8.5  Hip internal rotation    Hip external rotation    Knee flexion 11.1 10.2  Knee extension 7.8 10  Ankle dorsiflexion 10.8 6.8  Ankle plantarflexion 7.2 6.9  Ankle inversion    Ankle eversion    (Blank rows = not tested)   FUNCTIONAL TESTS:  5 times sit to stand: 13 seconds with arms crossed.  6 minute walk test: next session  Functional gait assessment: 14/30  PATIENT SURVEYS:  ABC scale 60.5%                                                                                                                               TREATMENT DATE: 02/06/24  ***  Gait belt donned with CGA for all standing activities, thorough therapeutic rest breaks provided throughout session.   TherAct:  Aura carries with 15# KB's in each UE, lap around the gym, down EVS hallway, past cafeteria, and back to gym (160')  Briefcase carries with 15# KB's in one UE, lap around the gym, down EVS hallway, past cafeteria, and back to gym (160')  STS x10 with verbal cues for proper form   Neuro:  Static stance on incline board, CW, 30 sec bouts, x2 Static stance on incline board, CCW, 30 sec bouts, x2 Static stance on decline board, CW, 30 sec bouts, x2 Static stance on decline board, CCW, 30 sec bouts, x2  Static stance on Airex pad, 30 sec bouts Static stance on Airex  pad, eyes closed 30 sec bouts Static NBOS on Airex pad, 30 sec bouts Static NBOS on Airex pad, eyes closed, 30 sec bouts Static staggered stance on Airex pad, 30 sec bouts Static staggered stance on Airex pad, eyes closed, 30 sec bouts  Backwards ambulation in therapy hallway, length of the hallway, x2     PATIENT EDUCATION: Education details: Exercise technique; importance of daily stretching Person educated: Patient Education method: Explanation, Demonstration, Tactile cues, and Verbal cues Education comprehension: verbalized understanding, returned demonstration, verbal cues required, and tactile cues required   HOME EXERCISE PROGRAM: Access Code: [email or web address] URL: [email or web address] Date: 08/13/2023 Prepared by: Reyes London  Exercises - Hooklying Single Knee to Chest Stretch  - 1-2 x daily - 3 sets - 30 sec  hold - Supine Lower Trunk Rotation  - 1-2 x daily - 3 sets - 30 sec hold - Supine Hamstring Stretch with Strap  - 1-2 x daily - 3 sets - 30 sec hold - Seated Hamstring Stretch  - 1-2 x daily - 3 sets - 30 sec hold - Supine Hip Internal and External Rotation  - 1-2 x daily - 3 sets - 10 reps - Seated Hip External Rotation Stretch  - 1 x daily - 3 sets - 30 sec hold - Supine Figure 4 Piriformis Stretch  - 1 x daily - 3 sets -  30 sec hold  GOALS:  Goals reviewed with patient? Yes  SHORT TERM GOALS: Target date: 05/22/2023  Patient will be independent in home exercise program to improve strength/mobility for better functional independence with ADLs. Baseline: 4/14: compliant  Goal status: MET   LONG TERM GOALS: Target date: 12/31/2023  Patient will increase Functional Gait Assessment score to >25/30 as to reduce fall risk and improve dynamic gait safety with community ambulation. Baseline: 1/28: 14/30   4/14: 17/30   6/11: 18/30  7/14: 20/30 12/05/23:    01/02/24: 20/30 Goal status: Partially Met  2.   Patient (< 59 years old) will complete  five times sit to stand test in <10 seconds indicating an increased LE strength and improved balance. Baseline: 1/28: 13 seconds  4/14: 10.72 seconds no hands  6/11: 10 seconds no hands  12/05/23: 14.44 sec no hands 01/02/24: 11.71 sec hands free; 9.90sec Goal status: Re-Assessed  3.  Patient will increase six minute walk test distance to >1000 for progression to community ambulator and improve gait ability Baseline: Perform next session 3/4: 820 ft  4/14: 890 ft with [facility]   6/11: 888 ft with [facility]  7/14: 870 ft with SPC- only one instance of L foot catching on ground throughout test 12/05/23: 818 ft with SPC and new Cadense shoes 01/02/24: 932ft c SPC and cadence  Goal status: Partially Met   4.  Patient will return to nightly ambulations with less than five foot scuffs per ambulation to return to PLOF.  Baseline: 1/28: limited ambulation  4/14: some limitations pending on day. Encouraged to perform multiple short walks  6/11: started doing nightly walks ; decreased foot scuffs   7/14: pt reports no scuffs Goal status: MET  5. Patient will increase ABC scale score >80% to demonstrate better functional mobility and better confidence with ADLs.  Baseline: 1/28: 60%   4/14: 65%   6/11: 68%  7/14: 59.38% 12/05/23: 56.88% 01/02/24: pending Goal status: Partially Met  6.  Pt will complete 30 minutes of low-impact cardio exercise (such as stationary cycling) 3 times per week for the next 6 weeks, gradually increasing the duration by 5 minutes every 2 weeks, to improve my cardiovascular endurance while monitoring for signs of fatigue or overheating. Baseline: 10/22/23: pt able to achieve 1 bout of 20 min in a week 12/05/23: Pt able to achieve 2 bouts of 25 minutes last week.   Goal Status: NEW   ASSESSMENT:  CLINICAL IMPRESSION:    ***     OBJECTIVE IMPAIRMENTS: Abnormal gait, decreased activity tolerance, decreased balance, decreased coordination, decreased endurance, decreased  mobility, difficulty walking, decreased strength, impaired perceived functional ability, impaired flexibility, impaired UE functional use, and improper body mechanics.   ACTIVITY LIMITATIONS: carrying, lifting, bending, sitting, standing, squatting, stairs, transfers, bed mobility, dressing, reach over head, hygiene/grooming, locomotion level, and caring for others  PARTICIPATION LIMITATIONS: meal prep, cleaning, laundry, interpersonal relationship, driving, shopping, community activity, and occupation  PERSONAL FACTORS: Age, Fitness, Past/current experiences, Profession, Time since onset of injury/illness/exacerbation, and 1-2 comorbidities: MS anxiety are also affecting patient's functional outcome.  REHAB POTENTIAL: Good CLINICAL DECISION MAKING: Evolving/moderate complexity EVALUATION COMPLEXITY: Moderate  PLAN:  PT FREQUENCY: 2x/week PT DURATION: 12 weeks PLANNED INTERVENTIONS: 97164- PT Re-evaluation, 97110-Therapeutic exercises, 97530- Therapeutic activity, 97112- Neuromuscular re-education, 97535- Self Care, 02859- Manual therapy, [phone]- Gait training, [phone]- Orthotic Fit/training, [phone]- Canalith repositioning, Patient/Family education, Balance training, Stair training, Taping, Dry Needling, Joint mobilization, Joint manipulation, Spinal mobilization, Vestibular training, Visual/preceptual remediation/compensation, DME  instructions, Cryotherapy, and Moist heat  PLAN FOR NEXT SESSION:    ***  airex pad/balance, LLE strengthening, HEP , LLE coordination. Adding cognitive/manual dual task with dynamic balance activities    Fonda Simpers, PT, DPT Physical Therapist - [facility]  02/06/24, 9:39 AM

## 2024-02-11 ENCOUNTER — Ambulatory Visit

## 2024-02-13 ENCOUNTER — Ambulatory Visit

## 2024-02-13 DIAGNOSIS — R262 Difficulty in walking, not elsewhere classified: Secondary | ICD-10-CM

## 2024-02-13 DIAGNOSIS — M6281 Muscle weakness (generalized): Secondary | ICD-10-CM

## 2024-02-13 DIAGNOSIS — M25521 Pain in right elbow: Secondary | ICD-10-CM

## 2024-02-13 DIAGNOSIS — R2681 Unsteadiness on feet: Secondary | ICD-10-CM

## 2024-02-13 DIAGNOSIS — R269 Unspecified abnormalities of gait and mobility: Secondary | ICD-10-CM

## 2024-02-13 DIAGNOSIS — Z9181 History of falling: Secondary | ICD-10-CM

## 2024-02-13 DIAGNOSIS — M25621 Stiffness of right elbow, not elsewhere classified: Secondary | ICD-10-CM

## 2024-02-13 NOTE — Therapy (Signed)
 OUTPATIENT PHYSICAL THERAPY TREATMENT  Patient Name: Michelle Ruiz MRN: [id number] DOB:1971/03/22, 53 y.o., female Today's Date: 02/13/2024  PCP: Sherial Bail  REFERRING PROVIDER: Maree Hila K   END OF SESSION:   PT End of Session - 02/13/24 1322     Visit Number 53    Number of Visits 60    Date for Recertification  02/27/24    Progress Note Due on Visit 50    PT Start Time 1318    PT Stop Time 1400    PT Time Calculation (min) 42 min    Equipment Utilized During Treatment Gait belt    Activity Tolerance Patient tolerated treatment well;No increased pain    Behavior During Therapy WFL for tasks assessed/performed          Past Medical History:  Diagnosis Date   Abnormal Pap smear of cervix    Actinic keratosis 09/17/2023   R spinal mid upper back   Anxiety    Basal cell carcinoma 04/14/2008   Right upper ant. thigh. BCC with sclerosis.   Basal cell carcinoma 08/14/2008   Right lat. lower thigh. Superficial.    Basal cell carcinoma 04/29/2019   Right mid forearm. Superficial and nodular patterns. EDC.   Basal cell carcinoma 08/17/2020   Right upper antecubital. EDC.   Basal cell carcinoma 08/23/2021   Left Lower Leg, EDC   Basal cell carcinoma 09/17/2023   L medial lower leg, EDC   Kidney stones    MS (multiple sclerosis)    UTI (urinary tract infection)    Past Surgical History:  Procedure Laterality Date   BREAST BIOPSY Right 2010   stereotactic biopsy/ neg/ dr.byrnett   BREAST BIOPSY Left 2012   neg/dr. brynett   CESAREAN SECTION  2008   RPH   TONSILLECTOMY  2006   There are no active problems to display for this patient.  ONSET DATE: diagnosed 12 years ago.   REFERRING DIAG: MS, gait and mobility   THERAPY DIAG:  Difficulty in walking, not elsewhere classified  Abnormality of gait and mobility  Unsteadiness on feet  Muscle weakness (generalized)  History of falling  Stiffness of right elbow, not elsewhere classified  Pain in  right elbow  Rationale for Evaluation and Treatment: Rehabilitation  SUBJECTIVE:                                                                                                                                                                                             SUBJECTIVE STATEMENT:  Pt reports that she had to cancel the last visit due to having to take care of her mother's  appointments and other items.  Pt notes that she is doing well otherwise.    Pt accompanied by: self  PERTINENT HISTORY: Patient was seen by physical therapy until September of last year. Pmh includes MS, UTI, anxiety. Has extreme spasticity in LLE. Extreme cold and heat affect her LLE. Patient is now taking hormonal medication. Patient is a chartered certified accountant.   PAIN:  Are you having pain? No  PRECAUTIONS: None  RED FLAGS: None   WEIGHT BEARING RESTRICTIONS: No  FALLS: Has patient fallen in last 6 months? No  LIVING ENVIRONMENT: Lives with: lives with their family Lives in: House/apartment Stairs: no stairs to enter, lives on second floor Has following equipment at home: Single point cane and shower chair  PLOF: Independent  PATIENT GOALS: continue walking as long as she can.   OBJECTIVE:  Note: Objective measures were completed at Evaluation unless otherwise noted.  LOWER EXTREMITY MMT:    MMT Right Eval Left Eval  Hip flexion 6.4 4.2  Hip extension    Hip abduction 7.9 3.4  Hip adduction 7.5 8.5  Hip internal rotation    Hip external rotation    Knee flexion 11.1 10.2  Knee extension 7.8 10  Ankle dorsiflexion 10.8 6.8  Ankle plantarflexion 7.2 6.9  Ankle inversion    Ankle eversion    (Blank rows = not tested)   FUNCTIONAL TESTS:  5 times sit to stand: 13 seconds with arms crossed.  6 minute walk test: next session  Functional gait assessment: 14/30  PATIENT SURVEYS:  ABC scale 60.5%                                                                                                                                TREATMENT DATE: 02/13/24    Gait belt donned with CGA for all standing activities, thorough therapeutic rest breaks provided throughout session.   Gait:  Patient ambulated 30 min indoor/outdoor today- including tile, concrete, carpet, horizontal head turns, on varied surfaces: concrete, brick, asphalt, mulch, grass, up ramp, inclines, declines- with no seated rest break(s) - CGA for all indoor/outdoor - zero instance of instability when ambulating over pine needles - Focusing on overall endurance and to improve ambulation across uneven terrain that pt would come in contact with in the community.    TherEx:  Wellzone:  Seated leg press, 6 plates, [id number] Seated leg extension, 4 plates, k84 Seated hamstring curls, 4 plates, k84      PATIENT EDUCATION: Education details: Exercise technique; importance of daily stretching Person educated: Patient Education method: Explanation, Demonstration, Tactile cues, and Verbal cues Education comprehension: verbalized understanding, returned demonstration, verbal cues required, and tactile cues required   HOME EXERCISE PROGRAM: Access Code: [email or web address] URL: [email or web address] Date: 08/13/2023 Prepared by: Reyes London  Exercises - Hooklying Single Knee to Chest Stretch  - 1-2 x daily - 3 sets - 30 sec  hold - Supine Lower Trunk Rotation  - 1-2 x daily -  3 sets - 30 sec hold - Supine Hamstring Stretch with Strap  - 1-2 x daily - 3 sets - 30 sec hold - Seated Hamstring Stretch  - 1-2 x daily - 3 sets - 30 sec hold - Supine Hip Internal and External Rotation  - 1-2 x daily - 3 sets - 10 reps - Seated Hip External Rotation Stretch  - 1 x daily - 3 sets - 30 sec hold - Supine Figure 4 Piriformis Stretch  - 1 x daily - 3 sets - 30 sec hold  GOALS:  Goals reviewed with patient? Yes  SHORT TERM GOALS: Target date: 05/22/2023  Patient will be independent in home exercise program to  improve strength/mobility for better functional independence with ADLs. Baseline: 4/14: compliant  Goal status: MET   LONG TERM GOALS: Target date: 12/31/2023  Patient will increase Functional Gait Assessment score to >25/30 as to reduce fall risk and improve dynamic gait safety with community ambulation. Baseline: 1/28: 14/30   4/14: 17/30   6/11: 18/30  7/14: 20/30 12/05/23:    01/02/24: 20/30 Goal status: Partially Met  2.   Patient (< 64 years old) will complete five times sit to stand test in <10 seconds indicating an increased LE strength and improved balance. Baseline: 1/28: 13 seconds  4/14: 10.72 seconds no hands  6/11: 10 seconds no hands  12/05/23: 14.44 sec no hands 01/02/24: 11.71 sec hands free; 9.90sec Goal status: Re-Assessed  3.  Patient will increase six minute walk test distance to >1000 for progression to community ambulator and improve gait ability Baseline: Perform next session 3/4: 820 ft  4/14: 890 ft with [facility]   6/11: 888 ft with [facility]  7/14: 870 ft with SPC- only one instance of L foot catching on ground throughout test 12/05/23: 818 ft with SPC and new Cadense shoes 01/02/24: 970ft c SPC and cadence  Goal status: Partially Met   4.  Patient will return to nightly ambulations with less than five foot scuffs per ambulation to return to PLOF.  Baseline: 1/28: limited ambulation  4/14: some limitations pending on day. Encouraged to perform multiple short walks  6/11: started doing nightly walks ; decreased foot scuffs   7/14: pt reports no scuffs Goal status: MET  5. Patient will increase ABC scale score >80% to demonstrate better functional mobility and better confidence with ADLs.  Baseline: 1/28: 60%   4/14: 65%   6/11: 68%  7/14: 59.38% 12/05/23: 56.88% 01/02/24: pending Goal status: Partially Met  6.  Pt will complete 30 minutes of low-impact cardio exercise (such as stationary cycling) 3 times per week for the next 6 weeks, gradually increasing the  duration by 5 minutes every 2 weeks, to improve my cardiovascular endurance while monitoring for signs of fatigue or overheating. Baseline: 10/22/23: pt able to achieve 1 bout of 20 min in a week 12/05/23: Pt able to achieve 2 bouts of 25 minutes last week.   Goal Status: NEW   ASSESSMENT:  CLINICAL IMPRESSION:    Pt responded well to the exercises and was able to tolerate increased distance and time of mobilizing outside.  Pt continues to benefit from exercises that challenge her endurance levels, along with LE strengthening necessary for mobilization.   Pt will continue to benefit from skilled therapy to address remaining deficits in order to improve overall QoL and return to PLOF.        OBJECTIVE IMPAIRMENTS: Abnormal gait, decreased activity tolerance, decreased balance, decreased coordination, decreased endurance,  decreased mobility, difficulty walking, decreased strength, impaired perceived functional ability, impaired flexibility, impaired UE functional use, and improper body mechanics.   ACTIVITY LIMITATIONS: carrying, lifting, bending, sitting, standing, squatting, stairs, transfers, bed mobility, dressing, reach over head, hygiene/grooming, locomotion level, and caring for others  PARTICIPATION LIMITATIONS: meal prep, cleaning, laundry, interpersonal relationship, driving, shopping, community activity, and occupation  PERSONAL FACTORS: Age, Fitness, Past/current experiences, Profession, Time since onset of injury/illness/exacerbation, and 1-2 comorbidities: MS anxiety are also affecting patient's functional outcome.  REHAB POTENTIAL: Good CLINICAL DECISION MAKING: Evolving/moderate complexity EVALUATION COMPLEXITY: Moderate  PLAN:  PT FREQUENCY: 2x/week PT DURATION: 12 weeks PLANNED INTERVENTIONS: 97164- PT Re-evaluation, 97110-Therapeutic exercises, 97530- Therapeutic activity, 97112- Neuromuscular re-education, 97535- Self Care, 02859- Manual therapy, [phone]- Gait training,  [phone]- Orthotic Fit/training, [phone]- Canalith repositioning, Patient/Family education, Balance training, Stair training, Taping, Dry Needling, Joint mobilization, Joint manipulation, Spinal mobilization, Vestibular training, Visual/preceptual remediation/compensation, DME instructions, Cryotherapy, and Moist heat  PLAN FOR NEXT SESSION:    airex pad/balance, LLE strengthening, HEP , LLE coordination. Adding cognitive/manual dual task with dynamic balance activities    Fonda Simpers, PT, DPT Physical Therapist - [facility]  02/13/24, 2:07 PM

## 2024-02-18 ENCOUNTER — Ambulatory Visit

## 2024-02-18 DIAGNOSIS — R2681 Unsteadiness on feet: Secondary | ICD-10-CM

## 2024-02-18 DIAGNOSIS — R269 Unspecified abnormalities of gait and mobility: Secondary | ICD-10-CM

## 2024-02-18 DIAGNOSIS — R262 Difficulty in walking, not elsewhere classified: Secondary | ICD-10-CM

## 2024-02-18 DIAGNOSIS — Z9181 History of falling: Secondary | ICD-10-CM

## 2024-02-18 DIAGNOSIS — M6281 Muscle weakness (generalized): Secondary | ICD-10-CM

## 2024-02-18 NOTE — Therapy (Signed)
 OUTPATIENT PHYSICAL THERAPY TREATMENT  Patient Name: Michelle Ruiz MRN: [id number] DOB:24-Dec-1970, 53 y.o., female Today's Date: 02/18/2024  PCP: Sherial Bail  REFERRING PROVIDER: Maree Hila K   END OF SESSION:   PT End of Session - 02/18/24 1611     Visit Number 54    Number of Visits 60    Date for Recertification  02/27/24    Progress Note Due on Visit 50    PT Start Time 1614    PT Stop Time 1659    PT Time Calculation (min) 45 min    Equipment Utilized During Treatment Gait belt    Activity Tolerance Patient tolerated treatment well;No increased pain    Behavior During Therapy WFL for tasks assessed/performed          Past Medical History:  Diagnosis Date   Abnormal Pap smear of cervix    Actinic keratosis 09/17/2023   R spinal mid upper back   Anxiety    Basal cell carcinoma 04/14/2008   Right upper ant. thigh. BCC with sclerosis.   Basal cell carcinoma 08/14/2008   Right lat. lower thigh. Superficial.    Basal cell carcinoma 04/29/2019   Right mid forearm. Superficial and nodular patterns. EDC.   Basal cell carcinoma 08/17/2020   Right upper antecubital. EDC.   Basal cell carcinoma 08/23/2021   Left Lower Leg, EDC   Basal cell carcinoma 09/17/2023   L medial lower leg, EDC   Kidney stones    MS (multiple sclerosis)    UTI (urinary tract infection)    Past Surgical History:  Procedure Laterality Date   BREAST BIOPSY Right 2010   stereotactic biopsy/ neg/ dr.byrnett   BREAST BIOPSY Left 2012   neg/dr. brynett   CESAREAN SECTION  2008   RPH   TONSILLECTOMY  2006   There are no active problems to display for this patient.  ONSET DATE: diagnosed 12 years ago.   REFERRING DIAG: MS, gait and mobility   THERAPY DIAG:  Difficulty in walking, not elsewhere classified  Abnormality of gait and mobility  Unsteadiness on feet  Muscle weakness (generalized)  History of falling  Rationale for Evaluation and Treatment:  Rehabilitation  SUBJECTIVE:                                                                                                                                                                                             SUBJECTIVE STATEMENT: Pt reports doing well. Reports having significant soreness in LE's after outdoor walking activities then wellzone exercises. Reports R knee buckling multiple times during the work day. Intermittent need to hold onto the  wall for support. Took 4 days to recover back to baseline.   Pt accompanied by: self  PERTINENT HISTORY: Patient was seen by physical therapy until September of last year. Pmh includes MS, UTI, anxiety. Has extreme spasticity in LLE. Extreme cold and heat affect her LLE. Patient is now taking hormonal medication. Patient is a chartered certified accountant.   PAIN:  Are you having pain? No  PRECAUTIONS: None  RED FLAGS: None   WEIGHT BEARING RESTRICTIONS: No  FALLS: Has patient fallen in last 6 months? No  LIVING ENVIRONMENT: Lives with: lives with their family Lives in: House/apartment Stairs: no stairs to enter, lives on second floor Has following equipment at home: Single point cane and shower chair  PLOF: Independent  PATIENT GOALS: continue walking as long as she can.   OBJECTIVE:  Note: Objective measures were completed at Evaluation unless otherwise noted.  LOWER EXTREMITY MMT:    MMT Right Eval Left Eval  Hip flexion 6.4 4.2  Hip extension    Hip abduction 7.9 3.4  Hip adduction 7.5 8.5  Hip internal rotation    Hip external rotation    Knee flexion 11.1 10.2  Knee extension 7.8 10  Ankle dorsiflexion 10.8 6.8  Ankle plantarflexion 7.2 6.9  Ankle inversion    Ankle eversion    (Blank rows = not tested)   FUNCTIONAL TESTS:  5 times sit to stand: 13 seconds with arms crossed.  6 minute walk test: next session  Functional gait assessment: 14/30  PATIENT SURVEYS:  ABC scale 60.5%                                                                                                                                TREATMENT DATE: 02/18/24     Neuro Re-Ed:   Resisted gait 7.5# on OMEGA system.  5 reps forward/backward. X2 R/L lateral. Seated rest between.    Gait in hallway with ball tosses to self: 4 laps forwards/backwards, CGA. Dual task with conversation during ball tosses. 3 KG med ball   Gait in hallway with ball passes CW 1 lap, CCW 1 lap 2 KG med ball. Dual task with conversation and passing ball to self      PATIENT EDUCATION: Education details: Exercise technique; importance of daily stretching Person educated: Patient Education method: Explanation, Demonstration, Tactile cues, and Verbal cues Education comprehension: verbalized understanding, returned demonstration, verbal cues required, and tactile cues required   HOME EXERCISE PROGRAM: Access Code: [email or web address] URL: [email or web address] Date: 08/13/2023 Prepared by: Reyes London  Exercises - Hooklying Single Knee to Chest Stretch  - 1-2 x daily - 3 sets - 30 sec  hold - Supine Lower Trunk Rotation  - 1-2 x daily - 3 sets - 30 sec hold - Supine Hamstring Stretch with Strap  - 1-2 x daily - 3 sets - 30 sec hold - Seated Hamstring Stretch  - 1-2 x daily - 3 sets - 30 sec hold -  Supine Hip Internal and External Rotation  - 1-2 x daily - 3 sets - 10 reps - Seated Hip External Rotation Stretch  - 1 x daily - 3 sets - 30 sec hold - Supine Figure 4 Piriformis Stretch  - 1 x daily - 3 sets - 30 sec hold  GOALS:  Goals reviewed with patient? Yes  SHORT TERM GOALS: Target date: 05/22/2023  Patient will be independent in home exercise program to improve strength/mobility for better functional independence with ADLs. Baseline: 4/14: compliant  Goal status: MET   LONG TERM GOALS: Target date: 12/31/2023  Patient will increase Functional Gait Assessment score to >25/30 as to reduce fall risk and improve dynamic gait safety  with community ambulation. Baseline: 1/28: 14/30   4/14: 17/30   6/11: 18/30  7/14: 20/30 12/05/23:    01/02/24: 20/30 Goal status: Partially Met  2.   Patient (< 68 years old) will complete five times sit to stand test in <10 seconds indicating an increased LE strength and improved balance. Baseline: 1/28: 13 seconds  4/14: 10.72 seconds no hands  6/11: 10 seconds no hands  12/05/23: 14.44 sec no hands 01/02/24: 11.71 sec hands free; 9.90sec Goal status: Re-Assessed  3.  Patient will increase six minute walk test distance to >1000 for progression to community ambulator and improve gait ability Baseline: Perform next session 3/4: 820 ft  4/14: 890 ft with [facility]   6/11: 888 ft with [facility]  7/14: 870 ft with SPC- only one instance of L foot catching on ground throughout test 12/05/23: 818 ft with SPC and new Cadense shoes 01/02/24: 93ft c SPC and cadence  Goal status: Partially Met   4.  Patient will return to nightly ambulations with less than five foot scuffs per ambulation to return to PLOF.  Baseline: 1/28: limited ambulation  4/14: some limitations pending on day. Encouraged to perform multiple short walks  6/11: started doing nightly walks ; decreased foot scuffs   7/14: pt reports no scuffs Goal status: MET  5. Patient will increase ABC scale score >80% to demonstrate better functional mobility and better confidence with ADLs.  Baseline: 1/28: 60%   4/14: 65%   6/11: 68%  7/14: 59.38% 12/05/23: 56.88% 01/02/24: pending Goal status: Partially Met  6.  Pt will complete 30 minutes of low-impact cardio exercise (such as stationary cycling) 3 times per week for the next 6 weeks, gradually increasing the duration by 5 minutes every 2 weeks, to improve my cardiovascular endurance while monitoring for signs of fatigue or overheating. Baseline: 10/22/23: pt able to achieve 1 bout of 20 min in a week 12/05/23: Pt able to achieve 2 bouts of 25 minutes last week.   Goal Status:  NEW   ASSESSMENT:  CLINICAL IMPRESSION:   Continuing PT POC working on dynamic balance incorporating cognitive and physical dual tasks. Changed session compared to prior sessions due to LE soreness. Pt continues to require frequent seated rest breaks due to LE fatigue. Difficulty with added perturbation with resisted gait particularly in lateral planes and eccentric portions of exercise. Pt will continue to benefit from skilled therapy to address remaining deficits in order to improve overall QoL and return to PLOF.       OBJECTIVE IMPAIRMENTS: Abnormal gait, decreased activity tolerance, decreased balance, decreased coordination, decreased endurance, decreased mobility, difficulty walking, decreased strength, impaired perceived functional ability, impaired flexibility, impaired UE functional use, and improper body mechanics.   ACTIVITY LIMITATIONS: carrying, lifting, bending, sitting, standing, squatting, stairs,  transfers, bed mobility, dressing, reach over head, hygiene/grooming, locomotion level, and caring for others  PARTICIPATION LIMITATIONS: meal prep, cleaning, laundry, interpersonal relationship, driving, shopping, community activity, and occupation  PERSONAL FACTORS: Age, Fitness, Past/current experiences, Profession, Time since onset of injury/illness/exacerbation, and 1-2 comorbidities: MS anxiety are also affecting patient's functional outcome.  REHAB POTENTIAL: Good CLINICAL DECISION MAKING: Evolving/moderate complexity EVALUATION COMPLEXITY: Moderate  PLAN:  PT FREQUENCY: 2x/week PT DURATION: 12 weeks PLANNED INTERVENTIONS: 97164- PT Re-evaluation, 97110-Therapeutic exercises, 97530- Therapeutic activity, 97112- Neuromuscular re-education, 97535- Self Care, [id number]- Manual therapy, [phone]- Gait training, [phone]- Orthotic Fit/training, [phone]- Canalith repositioning, Patient/Family education, Balance training, Stair training, Taping, Dry Needling, Joint mobilization, Joint  manipulation, Spinal mobilization, Vestibular training, Visual/preceptual remediation/compensation, DME instructions, Cryotherapy, and Moist heat  PLAN FOR NEXT SESSION:    airex pad/balance, LLE strengthening, HEP , LLE coordination. Adding cognitive/manual dual task with dynamic balance activities   Dorina HERO. Fairly IV, PT, DPT Physical Therapist- [facility] 02/18/24, 5:08 PM

## 2024-02-25 ENCOUNTER — Ambulatory Visit

## 2024-02-27 ENCOUNTER — Ambulatory Visit: Attending: Neurology

## 2024-02-27 DIAGNOSIS — R269 Unspecified abnormalities of gait and mobility: Secondary | ICD-10-CM | POA: Diagnosis present

## 2024-02-27 DIAGNOSIS — R262 Difficulty in walking, not elsewhere classified: Secondary | ICD-10-CM | POA: Diagnosis present

## 2024-02-27 DIAGNOSIS — R2681 Unsteadiness on feet: Secondary | ICD-10-CM | POA: Diagnosis present

## 2024-02-27 DIAGNOSIS — Z9181 History of falling: Secondary | ICD-10-CM | POA: Insufficient documentation

## 2024-02-27 DIAGNOSIS — M6281 Muscle weakness (generalized): Secondary | ICD-10-CM | POA: Insufficient documentation

## 2024-02-27 DIAGNOSIS — M25521 Pain in right elbow: Secondary | ICD-10-CM | POA: Diagnosis present

## 2024-02-27 DIAGNOSIS — M25621 Stiffness of right elbow, not elsewhere classified: Secondary | ICD-10-CM | POA: Diagnosis present

## 2024-02-27 NOTE — Therapy (Signed)
 OUTPATIENT PHYSICAL THERAPY TREATMENT/RE-CERT  Patient Name: Michelle Ruiz MRN: [id number] DOB:21-Apr-1970, 53 y.o., female Today's Date: 02/27/2024  PCP: Sherial Bail  REFERRING PROVIDER: Maree Hila K   END OF SESSION:   PT End of Session - 02/27/24 1323     Visit Number 55    Number of Visits 60    Date for Recertification  03/26/24    Progress Note Due on Visit 50    PT Start Time 1319    PT Stop Time 1400    PT Time Calculation (min) 41 min    Equipment Utilized During Treatment Gait belt    Activity Tolerance Patient tolerated treatment well;No increased pain    Behavior During Therapy WFL for tasks assessed/performed           Past Medical History:  Diagnosis Date   Abnormal Pap smear of cervix    Actinic keratosis 09/17/2023   R spinal mid upper back   Anxiety    Basal cell carcinoma 04/14/2008   Right upper ant. thigh. BCC with sclerosis.   Basal cell carcinoma 08/14/2008   Right lat. lower thigh. Superficial.    Basal cell carcinoma 04/29/2019   Right mid forearm. Superficial and nodular patterns. EDC.   Basal cell carcinoma 08/17/2020   Right upper antecubital. EDC.   Basal cell carcinoma 08/23/2021   Left Lower Leg, EDC   Basal cell carcinoma 09/17/2023   L medial lower leg, EDC   Kidney stones    MS (multiple sclerosis)    UTI (urinary tract infection)    Past Surgical History:  Procedure Laterality Date   BREAST BIOPSY Right 2010   stereotactic biopsy/ neg/ dr.byrnett   BREAST BIOPSY Left 2012   neg/dr. brynett   CESAREAN SECTION  2008   RPH   TONSILLECTOMY  2006   There are no active problems to display for this patient.  ONSET DATE: diagnosed 12 years ago.   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  Pt notes that she was  in so much pain that it caused her to be nauseous and was out of commission for 4 days.    Pt accompanied by: self  PERTINENT HISTORY: Patient was seen by physical therapy until September of last year. Pmh includes MS, UTI, anxiety. Has extreme spasticity in LLE. Extreme cold and heat affect her LLE. Patient is now taking hormonal medication. Patient is a chartered certified accountant.   PAIN:  Are you having pain? No  PRECAUTIONS: None  RED FLAGS: None   WEIGHT BEARING RESTRICTIONS: No  FALLS: Has patient fallen in last 6 months? No  LIVING ENVIRONMENT: Lives with: lives with their family Lives in: House/apartment Stairs: no stairs to enter, lives on second floor Has following equipment at home: Single point cane and shower chair  PLOF: Independent  PATIENT GOALS: continue walking as long as she can.   OBJECTIVE:  Note: Objective measures were completed at Evaluation unless otherwise noted.  LOWER EXTREMITY MMT:    MMT Right Eval Left Eval  Hip flexion 6.4 4.2  Hip extension    Hip abduction 7.9 3.4  Hip adduction 7.5 8.5  Hip internal rotation    Hip external rotation    Knee flexion 11.1 10.2  Knee extension 7.8 10  Ankle dorsiflexion 10.8 6.8  Ankle plantarflexion 7.2 6.9  Ankle inversion    Ankle eversion    (Blank rows = not tested)   FUNCTIONAL TESTS:  5 times sit to stand: 13 seconds with arms crossed.  6 minute walk test: next session  Functional gait assessment: 14/30  PATIENT SURVEYS:  ABC scale 60.5%                                                                                                                                TREATMENT DATE: 02/27/24    Physical Performance Testing:  6 Min Walk Test:  Instructed patient to ambulate as quickly and as safely as possible for 6 minutes using LRAD. Patient was allowed to take standing rest breaks without stopping the test, but if the patient required a sitting rest break the clock would be stopped and the test would be over.  Results: 920 feet using a SPC with CGA. Results indicate that the patient has reduced endurance with ambulation compared to age matched norms.  Age Matched Norms (in meters): 46-69 yo M: 52 F: 59, 87-79 yo M: 90 F: 471, 62-89 yo M: 417 F: 392 MDC: 58.21 meters (190.98 feet) or 50 meters (ANPTA Core Set of Outcome Measures for Adults with Neurologic Conditions, 2018)   Five times Sit to Stand Test (FTSS)  TIME: 10.80 sec  Cut off scores indicative of increased fall risk: >12 sec CVA, >16 sec PD, >13 sec vestibular (ANPTA Core Set of Outcome Measures for Adults with Neurologic Conditions, 2018)  Activities-specific Balance Confidence Scale:  Score: 49.38% Increased risk of falls in community-dwelling, older adults <80% (79.89%)  0% = no confidence - 100% =  complete confidence (ANPTA Core Set of Outcome Measures for Adults with Neurologic Conditions, 2018)       PATIENT EDUCATION: Education details: Exercise technique; importance of daily stretching Person educated: Patient Education method: Explanation, Demonstration, Tactile cues, and Verbal cues Education comprehension: verbalized understanding, returned demonstration, verbal cues required, and tactile cues required   HOME EXERCISE PROGRAM: Access Code: [email or web address] URL: [email or web address] Date: 08/13/2023 Prepared by: Reyes London  Exercises - Hooklying Single Knee to Chest Stretch  - 1-2 x daily - 3 sets - 30 sec  hold - Supine Lower Trunk Rotation  - 1-2 x daily - 3 sets - 30 sec  hold - Supine Hamstring Stretch with Strap  - 1-2 x daily - 3 sets - 30 sec hold - Seated Hamstring Stretch  - 1-2 x daily - 3 sets - 30 sec hold - Supine Hip Internal and External Rotation  - 1-2 x daily - 3 sets - 10 reps - Seated Hip External Rotation Stretch  - 1 x daily - 3 sets - 30 sec hold - Supine Figure 4 Piriformis Stretch  - 1 x daily - 3 sets - 30 sec hold  GOALS:  Goals reviewed with patient? Yes  SHORT TERM GOALS: Target date: 05/22/2023  Patient will be independent in home exercise program to improve strength/mobility for better functional independence with ADLs. Baseline: 4/14: compliant  Goal status: MET   LONG TERM GOALS: Target date: 12/31/2023  Patient will increase Functional Gait Assessment score to >25/30 as to reduce fall risk and improve dynamic gait safety with community ambulation. Baseline: 1/28: 14/30   4/14: 17/30   6/11: 18/30  7/14: 20/30 12/05/23:    01/02/24: 20/30 Goal status: Partially Met  2.   Patient (< 26 years old) will complete five times sit to stand test in <10 seconds indicating an increased LE strength and improved balance. Baseline: 1/28: 13 seconds  4/14: 10.72 seconds no hands  6/11: 10 seconds no hands  12/05/23: 14.44 sec no hands 01/02/24: 11.71 sec hands free; 9.90sec 02/27/24: 10.80 secs hands free Goal status: PROGRESSING  3.  Patient will increase six minute walk test distance to >1000 for progression to community ambulator and improve gait ability Baseline: Perform next session 3/4: 820 ft  4/14: 890 ft with [facility]   6/11: 888 ft with [facility]  7/14: 870 ft with SPC- only one instance of L foot catching on ground throughout test 12/05/23: 818 ft with SPC and new Cadense shoes 01/02/24: 944ft c SPC and Cadense shoes 02/27/24: 920' with SPC and Cadense shoes Goal status: Partially Met   4.  Patient will return to nightly ambulations with less than five foot scuffs per ambulation to return to PLOF.  Baseline: 1/28: limited  ambulation  4/14: some limitations pending on day. Encouraged to perform multiple short walks  6/11: started doing nightly walks ; decreased foot scuffs   7/14: pt reports no scuffs Goal status: MET  5. Patient will increase ABC scale score >80% to demonstrate better functional mobility and better confidence with ADLs.  Baseline: 1/28: 60%   4/14: 65%   6/11: 68%  7/14: 59.38% 12/05/23: 56.88% 01/02/24: pending 02/27/24: 49.38% Goal status: Partially Met  6.  Pt will complete 30 minutes of low-impact cardio exercise (such as stationary cycling) 3 times per week for the next 6 weeks, gradually increasing the duration by 5 minutes every 2 weeks, to improve my cardiovascular endurance while monitoring for signs of fatigue or overheating. Baseline: 10/22/23: pt  able to achieve 1 bout of 20 min in a week 12/05/23: Pt able to achieve 2 bouts of 25 minutes last week.   02/27/24: Pt denies performing the stationary cycling and just walking the dog.  Goal Status: NOT PROGRESSING   ASSESSMENT:  CLINICAL IMPRESSION:    Pt participated in testing necessary for recertification during the session today.  Pt ultimately has made some improvements in her ability to ambulate, however has fallen behind on current HEP plan with lack of progress made on the cycling portion.  Pt advised to return to this task in order to build up necessary endurance levels for progression of goals and tasks.  Patient's condition has the potential to improve in response to therapy. Maximum improvement is yet to be obtained. The anticipated improvement is attainable and reasonable in a generally predictable time.   Pt will continue to benefit from skilled therapy to address remaining deficits in order to improve overall QoL and return to PLOF.       OBJECTIVE IMPAIRMENTS: Abnormal gait, decreased activity tolerance, decreased balance, decreased coordination, decreased endurance, decreased mobility, difficulty walking, decreased  strength, impaired perceived functional ability, impaired flexibility, impaired UE functional use, and improper body mechanics.   ACTIVITY LIMITATIONS: carrying, lifting, bending, sitting, standing, squatting, stairs, transfers, bed mobility, dressing, reach over head, hygiene/grooming, locomotion level, and caring for others  PARTICIPATION LIMITATIONS: meal prep, cleaning, laundry, interpersonal relationship, driving, shopping, community activity, and occupation  PERSONAL FACTORS: Age, Fitness, Past/current experiences, Profession, Time since onset of injury/illness/exacerbation, and 1-2 comorbidities: MS anxiety are also affecting patient's functional outcome.  REHAB POTENTIAL: Good CLINICAL DECISION MAKING: Evolving/moderate complexity EVALUATION COMPLEXITY: Moderate  PLAN:  PT FREQUENCY: 2x/week PT DURATION: 12 weeks PLANNED INTERVENTIONS: 97164- PT Re-evaluation, 97110-Therapeutic exercises, 97530- Therapeutic activity, 97112- Neuromuscular re-education, 97535- Self Care, 02859- Manual therapy, [phone]- Gait training, [phone]- Orthotic Fit/training, [phone]- Canalith repositioning, Patient/Family education, Balance training, Stair training, Taping, Dry Needling, Joint mobilization, Joint manipulation, Spinal mobilization, Vestibular training, Visual/preceptual remediation/compensation, DME instructions, Cryotherapy, and Moist heat  PLAN FOR NEXT SESSION:    airex pad/balance, LLE strengthening, HEP , LLE coordination. Adding cognitive/manual dual task with dynamic balance activities   Will plan to discharge at end of the year, and perform an evaluation on 04/03/24 when pt knows the amount of visits she will be allotted for the new year.    Fonda Simpers, PT, DPT Physical Therapist - [facility]  02/27/24, 3:16 PM

## 2024-03-03 ENCOUNTER — Ambulatory Visit

## 2024-03-05 ENCOUNTER — Ambulatory Visit

## 2024-03-10 ENCOUNTER — Ambulatory Visit

## 2024-03-12 ENCOUNTER — Ambulatory Visit

## 2024-03-12 NOTE — Therapy (Signed)
 OUTPATIENT PHYSICAL THERAPY TREATMENT  Patient Name: Michelle Ruiz MRN: [id number] DOB:[birth year], 53 y.o., female Today's Date: 03/13/2024  PCP: Sherial Bail  REFERRING PROVIDER: Maree Hila K   END OF SESSION:   PT End of Session - 03/13/24 1446     Visit Number 56    Number of Visits 60    Date for Recertification  03/26/24    Progress Note Due on Visit 50    PT Start Time 1445    PT Stop Time 1529    PT Time Calculation (min) 44 min    Equipment Utilized During Treatment Gait belt    Activity Tolerance Patient tolerated treatment well;No increased pain    Behavior During Therapy WFL for tasks assessed/performed            Past Medical History:  Diagnosis Date   Abnormal Pap smear of cervix    Actinic keratosis 09/17/2023   R spinal mid upper back   Anxiety    Basal cell carcinoma 04/14/2008   Right upper ant. thigh. BCC with sclerosis.   Basal cell carcinoma 08/14/2008   Right lat. lower thigh. Superficial.    Basal cell carcinoma 04/29/2019   Right mid forearm. Superficial and nodular patterns. EDC.   Basal cell carcinoma 08/17/2020   Right upper antecubital. EDC.   Basal cell carcinoma 08/23/2021   Left Lower Leg, EDC   Basal cell carcinoma 09/17/2023   L medial lower leg, EDC   Kidney stones    MS (multiple sclerosis)    UTI (urinary tract infection)    Past Surgical History:  Procedure Laterality Date   BREAST BIOPSY Right 2010   stereotactic biopsy/ neg/ dr.byrnett   BREAST BIOPSY Left 2012   neg/dr. brynett   CESAREAN SECTION  2008   RPH   TONSILLECTOMY  2006   There are no active problems to display for this patient.  ONSET DATE: diagnosed 12 years ago.   REFERRING DIAG: MS, gait and mobility   THERAPY DIAG:  Difficulty in walking, not elsewhere classified  Abnormality of gait and mobility  Unsteadiness on feet  Muscle weakness (generalized)  Rationale for Evaluation and Treatment: Rehabilitation  SUBJECTIVE:                                                                                                                                                                                              SUBJECTIVE STATEMENT:  Patient has a christmas party this evening.   Pt accompanied by: self  PERTINENT HISTORY: Patient was seen by physical therapy until September of last year. Pmh includes MS, UTI, anxiety. Has extreme  spasticity in LLE. Extreme cold and heat affect her LLE. Patient is now taking hormonal medication. Patient is a chartered certified accountant.   PAIN:  Are you having pain? No  PRECAUTIONS: None  RED FLAGS: None   WEIGHT BEARING RESTRICTIONS: No  FALLS: Has patient fallen in last 6 months? No  LIVING ENVIRONMENT: Lives with: lives with their family Lives in: House/apartment Stairs: no stairs to enter, lives on second floor Has following equipment at home: Single point cane and shower chair  PLOF: Independent  PATIENT GOALS: continue walking as long as she can.   OBJECTIVE:  Note: Objective measures were completed at Evaluation unless otherwise noted.  LOWER EXTREMITY MMT:    MMT Right Eval Left Eval  Hip flexion 6.4 4.2  Hip extension    Hip abduction 7.9 3.4  Hip adduction 7.5 8.5  Hip internal rotation    Hip external rotation    Knee flexion 11.1 10.2  Knee extension 7.8 10  Ankle dorsiflexion 10.8 6.8  Ankle plantarflexion 7.2 6.9  Ankle inversion    Ankle eversion    (Blank rows = not tested)   FUNCTIONAL TESTS:  5 times sit to stand: 13 seconds with arms crossed.  6 minute walk test: next session  Functional gait assessment: 14/30  PATIENT SURVEYS:  ABC scale 60.5%                                                                                                                               TREATMENT DATE: 03/13/2024        TherAct: Self ball toss 4x100 ft; two rest breaks occasional LOB Ball bounce on floor 4x100 ft;  one rest break occasional LOB Sit to  stand 10x with 3lb overhead press 3lb DB curl with march 10x each LE  Large step forward with arm abduction 10x each LE   TherEx: Seated adduction with IR/ER 15x; 2 sets Heel toe raises 15x   PATIENT EDUCATION: Education details: Exercise technique; importance of daily stretching Person educated: Patient Education method: Explanation, Demonstration, Tactile cues, and Verbal cues Education comprehension: verbalized understanding, returned demonstration, verbal cues required, and tactile cues required   HOME EXERCISE PROGRAM: Access Code: [email or web address] URL: [email or web address] Date: 08/13/2023 Prepared by: Reyes London  Exercises - Hooklying Single Knee to Chest Stretch  - 1-2 x daily - 3 sets - 30 sec  hold - Supine Lower Trunk Rotation  - 1-2 x daily - 3 sets - 30 sec hold - Supine Hamstring Stretch with Strap  - 1-2 x daily - 3 sets - 30 sec hold - Seated Hamstring Stretch  - 1-2 x daily - 3 sets - 30 sec hold - Supine Hip Internal and External Rotation  - 1-2 x daily - 3 sets - 10 reps - Seated Hip External Rotation Stretch  - 1 x daily - 3 sets - 30 sec hold - Supine Figure 4 Piriformis Stretch  - 1 x daily -  3 sets - 30 sec hold  GOALS:  Goals reviewed with patient? Yes  SHORT TERM GOALS: Target date: 05/22/2023  Patient will be independent in home exercise program to improve strength/mobility for better functional independence with ADLs. Baseline: 4/14: compliant  Goal status: MET   LONG TERM GOALS: Target date: 12/31/2023  Patient will increase Functional Gait Assessment score to >25/30 as to reduce fall risk and improve dynamic gait safety with community ambulation. Baseline: 1/28: 14/30   4/14: 17/30   6/11: 18/30  7/14: 20/30 12/05/23:    01/02/24: 20/30 Goal status: Partially Met  2.   Patient (< 12 years old) will complete five times sit to stand test in <10 seconds indicating an increased LE strength and improved balance. Baseline: 1/28:  13 seconds  4/14: 10.72 seconds no hands  6/11: 10 seconds no hands  12/05/23: 14.44 sec no hands 01/02/24: 11.71 sec hands free; 9.90sec 02/27/24: 10.80 secs hands free Goal status: PROGRESSING  3.  Patient will increase six minute walk test distance to >1000 for progression to community ambulator and improve gait ability Baseline: Perform next session 3/4: 820 ft  4/14: 890 ft with [facility]   6/11: 888 ft with [facility]  7/14: 870 ft with SPC- only one instance of L foot catching on ground throughout test 12/05/23: 818 ft with SPC and new [employer] shoes 01/02/24: 951ft c SPC and [employer] shoes 02/27/24: 920' with SPC and [employer]ense shoes Goal status: Partially Met   4.  Patient will return to nightly ambulations with less than five foot scuffs per ambulation to return to PLOF.  Baseline: 1/28: limited ambulation  4/14: some limitations pending on day. Encouraged to perform multiple short walks  6/11: started doing nightly walks ; decreased foot scuffs   7/14: pt reports no scuffs Goal status: MET  5. Patient will increase ABC scale score >80% to demonstrate better functional mobility and better confidence with ADLs.  Baseline: 1/28: 60%   4/14: 65%   6/11: 68%  7/14: 59.38% 12/05/23: 56.88% 01/02/24: pending 02/27/24: 49.38% Goal status: Partially Met  6.  Pt will complete 30 minutes of low-impact cardio exercise (such as stationary cycling) 3 times per week for the next 6 weeks, gradually increasing the duration by 5 minutes every 2 weeks, to improve my cardiovascular endurance while monitoring for signs of fatigue or overheating. Baseline: 10/22/23: pt able to achieve 1 bout of 20 min in a week 12/05/23: Pt able to achieve 2 bouts of 25 minutes last week.   02/27/24: Pt denies performing the stationary cycling and just walking the dog.  Goal Status: NOT PROGRESSING   ASSESSMENT:  CLINICAL IMPRESSION:    Patient presents with excellent motivation. She tolerates ambulation in hallway with dual  task of ball pass. Patient requests addition of UE strength in addition to balance. She fatigues quickly with total body exercises indicating area of continued focus. Pt will continue to benefit from skilled therapy to address remaining deficits in order to improve overall QoL and return to PLOF.       OBJECTIVE IMPAIRMENTS: Abnormal gait, decreased activity tolerance, decreased balance, decreased coordination, decreased endurance, decreased mobility, difficulty walking, decreased strength, impaired perceived functional ability, impaired flexibility, impaired UE functional use, and improper body mechanics.   ACTIVITY LIMITATIONS: carrying, lifting, bending, sitting, standing, squatting, stairs, transfers, bed mobility, dressing, reach over head, hygiene/grooming, locomotion level, and caring for others  PARTICIPATION LIMITATIONS: meal prep, cleaning, laundry, interpersonal relationship, driving, shopping, community activity, and occupation  PERSONAL  FACTORS: Age, Fitness, Past/current experiences, Profession, Time since onset of injury/illness/exacerbation, and 1-2 comorbidities: MS anxiety are also affecting patient's functional outcome.  REHAB POTENTIAL: Good CLINICAL DECISION MAKING: Evolving/moderate complexity EVALUATION COMPLEXITY: Moderate  PLAN:  PT FREQUENCY: 2x/week PT DURATION: 12 weeks PLANNED INTERVENTIONS: 97164- PT Re-evaluation, 97110-Therapeutic exercises, 97530- Therapeutic activity, 97112- Neuromuscular re-education, 97535- Self Care, 02859- Manual therapy, [phone]- Gait training, [phone]- Orthotic Fit/training, [phone]- Canalith repositioning, Patient/Family education, Balance training, Stair training, Taping, Dry Needling, Joint mobilization, Joint manipulation, Spinal mobilization, Vestibular training, Visual/preceptual remediation/compensation, DME instructions, Cryotherapy, and Moist heat  PLAN FOR NEXT SESSION:    airex pad/balance, LLE strengthening, HEP , LLE  coordination. Adding cognitive/manual dual task with dynamic balance activities   Will plan to discharge at end of the year, and perform an evaluation on 04/03/24 when pt knows the amount of visits she will be allotted for the new year.   Anuar Walgren  Leopoldo, PT, DPT Physical Therapist - [facility]  Outpatient Physical Therapy- Main Campus [phone]     03/13/2024, 3:29 PM

## 2024-03-13 ENCOUNTER — Ambulatory Visit

## 2024-03-13 DIAGNOSIS — M6281 Muscle weakness (generalized): Secondary | ICD-10-CM

## 2024-03-13 DIAGNOSIS — R269 Unspecified abnormalities of gait and mobility: Secondary | ICD-10-CM

## 2024-03-13 DIAGNOSIS — R262 Difficulty in walking, not elsewhere classified: Secondary | ICD-10-CM

## 2024-03-13 DIAGNOSIS — R2681 Unsteadiness on feet: Secondary | ICD-10-CM

## 2024-03-17 ENCOUNTER — Ambulatory Visit

## 2024-03-19 ENCOUNTER — Ambulatory Visit

## 2024-03-24 ENCOUNTER — Ambulatory Visit

## 2024-03-25 NOTE — Therapy (Signed)
 " OUTPATIENT PHYSICAL THERAPY TREATMENT/RECERT  Patient Name: Michelle Ruiz MRN: [id number] DOB:Dec 10, 1970, 53 y.o., female Today's Date: 03/26/2024  PCP: Sherial Bail  REFERRING PROVIDER: Maree Hila K   END OF SESSION:   PT End of Session - 03/26/24 1442     Visit Number 57    Number of Visits 81    Date for Recertification  06/18/24    Progress Note Due on Visit 50    PT Start Time 1445    PT Stop Time 1529    PT Time Calculation (min) 44 min    Equipment Utilized During Treatment Gait belt    Activity Tolerance Patient tolerated treatment well;No increased pain    Behavior During Therapy WFL for tasks assessed/performed             Past Medical History:  Diagnosis Date   Abnormal Pap smear of cervix    Actinic keratosis 09/17/2023   R spinal mid upper back   Anxiety    Basal cell carcinoma 04/14/2008   Right upper ant. thigh. BCC with sclerosis.   Basal cell carcinoma 08/14/2008   Right lat. lower thigh. Superficial.    Basal cell carcinoma 04/29/2019   Right mid forearm. Superficial and nodular patterns. EDC.   Basal cell carcinoma 08/17/2020   Right upper antecubital. EDC.   Basal cell carcinoma 08/23/2021   Left Lower Leg, EDC   Basal cell carcinoma 09/17/2023   L medial lower leg, EDC   Kidney stones    MS (multiple sclerosis)    UTI (urinary tract infection)    Past Surgical History:  Procedure Laterality Date   BREAST BIOPSY Right 2010   stereotactic biopsy/ neg/ dr.byrnett   BREAST BIOPSY Left 2012   neg/dr. brynett   CESAREAN SECTION  2008   RPH   TONSILLECTOMY  2006   There are no active problems to display for this patient.  ONSET DATE: diagnosed 12 years ago.   REFERRING DIAG: MS, gait and mobility   THERAPY DIAG:  Difficulty in walking, not elsewhere classified - Plan: PT plan of care cert/re-cert  Abnormality of gait and mobility - Plan: PT plan of care cert/re-cert  Unsteadiness on feet - Plan: PT plan of care  cert/re-cert  Muscle weakness (generalized) - Plan: PT plan of care cert/re-cert  Rationale for Evaluation and Treatment: Rehabilitation  SUBJECTIVE:                                                                                                                                                                                             SUBJECTIVE STATEMENT:  Patient reports she has  new insurance. Reports her walking is improving, still continues to have scuffing of LLE. Is now doing daily walks.   Pt accompanied by: self  PERTINENT HISTORY: Patient was seen by physical therapy until September of last year. Pmh includes MS, UTI, anxiety. Has extreme spasticity in LLE. Extreme cold and heat affect her LLE. Patient is now taking hormonal medication. Patient is a chartered certified accountant.   PAIN:  Are you having pain? No  PRECAUTIONS: None  RED FLAGS: None   WEIGHT BEARING RESTRICTIONS: No  FALLS: Has patient fallen in last 6 months? No  LIVING ENVIRONMENT: Lives with: lives with their family Lives in: House/apartment Stairs: no stairs to enter, lives on second floor Has following equipment at home: Single point cane and shower chair  PLOF: Independent  PATIENT GOALS: continue walking as long as she can.   OBJECTIVE:  Note: Objective measures were completed at Evaluation unless otherwise noted.  LOWER EXTREMITY MMT:    MMT Right Eval Left Eval  Hip flexion 6.4 4.2  Hip extension    Hip abduction 7.9 3.4  Hip adduction 7.5 8.5  Hip internal rotation    Hip external rotation    Knee flexion 11.1 10.2  Knee extension 7.8 10  Ankle dorsiflexion 10.8 6.8  Ankle plantarflexion 7.2 6.9  Ankle inversion    Ankle eversion    (Blank rows = not tested)   FUNCTIONAL TESTS:  5 times sit to stand: 13 seconds with arms crossed.  6 minute walk test: next session  Functional gait assessment: 14/30  PATIENT SURVEYS:  ABC scale 60.5%                                                                                                                                TREATMENT DATE: 03/26/2024   Physical therapy treatment session today consisted of completing assessment of goals and administration of testing as demonstrated and documented in flow sheet, treatment, and goals section of this note. Addition treatments may be found below.    FGA:   [facility] PT Assessment - 03/26/24 0001       Functional Gait  Assessment   Gait assessed  Yes    Gait Level Surface Walks 20 ft in less than 7 sec but greater than 5.5 sec, uses assistive device, slower speed, mild gait deviations, or deviates 6-10 in outside of the 12 in walkway width.    Change in Gait Speed Able to smoothly change walking speed without loss of balance or gait deviation. Deviate no more than 6 in outside of the 12 in walkway width.    Gait with Horizontal Head Turns Performs head turns smoothly with slight change in gait velocity (eg, minor disruption to smooth gait path), deviates 6-10 in outside 12 in walkway width, or uses an assistive device.    Gait with Vertical Head Turns Performs task with slight change in gait velocity (eg, minor disruption to smooth gait path), deviates  6 - 10 in outside 12 in walkway width or uses assistive device    Gait and Pivot Turn Pivot turns safely within 3 sec and stops quickly with no loss of balance.    Step Over Obstacle Is able to step over one shoe box (4.5 in total height) without changing gait speed. No evidence of imbalance.    Gait with Narrow Base of Support Ambulates 7-9 steps.    Gait with Eyes Closed Walks 20 ft, uses assistive device, slower speed, mild gait deviations, deviates 6-10 in outside 12 in walkway width. Ambulates 20 ft in less than 9 sec but greater than 7 sec.    Ambulating Backwards Walks 20 ft, no assistive devices, good speed, no evidence for imbalance, normal gait    Steps Alternating feet, no rail.    Total Score 24             HEP education :   Access Code: [email or web address] URL: [email or web address] Date: 03/26/2024 Prepared by: Yesenia Locurto  Exercises with 3lb dumbbells - Squat with [employer]  2 sets - 10 reps - 5 hold - Half Deadlift with Kettlebell  - 2 sets - 10 reps - 5 hold - Upright Side Lunge 2 sets - 10 reps - 5 hold - Mini Lunge  -2 sets - 10 reps - 5 hold  PATIENT EDUCATION: Education details: Exercise technique; importance of daily stretching Person educated: Patient Education method: Explanation, Demonstration, Tactile cues, and Verbal cues Education comprehension: verbalized understanding, returned demonstration, verbal cues required, and tactile cues required   HOME EXERCISE PROGRAM: Access Code: [email or web address] URL: [email or web address] Date: 08/13/2023 Prepared by: Reyes London  Exercises - Hooklying Single Knee to Chest Stretch  - 1-2 x daily - 3 sets - 30 sec  hold - Supine Lower Trunk Rotation  - 1-2 x daily - 3 sets - 30 sec hold - Supine Hamstring Stretch with Strap  - 1-2 x daily - 3 sets - 30 sec hold - Seated Hamstring Stretch  - 1-2 x daily - 3 sets - 30 sec hold - Supine Hip Internal and External Rotation  - 1-2 x daily - 3 sets - 10 reps - Seated Hip External Rotation Stretch  - 1 x daily - 3 sets - 30 sec hold - Supine Figure 4 Piriformis Stretch  - 1 x daily - 3 sets - 30 sec hold  Access Code: [email or web address] URL: [email or web address] Date: 03/26/2024 Prepared by: Tychelle Purkey  Exercises - Squat with [employer]  - 1 x daily - 7 x weekly - 2 sets - 10 reps - 5 hold - Half Deadlift with Kettlebell  - 1 x daily - 7 x weekly - 2 sets - 10 reps - 5 hold - Upright Side Lunge  - 1 x daily - 7 x weekly - 2 sets - 10 reps - 5 hold - Mini Lunge  - 1 x daily - 7 x weekly - 2 sets - 10 reps - 5 hold  GOALS:  Goals reviewed with patient? Yes  SHORT TERM GOALS: Target date: 05/22/2023  Patient will be independent in home  exercise program to improve strength/mobility for better functional independence with ADLs. Baseline: 4/14: compliant  Goal status: MET   LONG TERM GOALS: Target date: 06/18/2024    Patient will increase Functional Gait Assessment score to >25/30 as to reduce fall risk and improve dynamic gait safety with community ambulation. Baseline: 1/28: 14/30   4/14: 17/30   6/11:  18/30  7/14: 20/30 12/05/23:    01/02/24: 20/30 12/31: 24/30 Goal status: Partially Met  2.   Patient (< 10 years old) will complete five times sit to stand test in <10 seconds indicating an increased LE strength and improved balance. Baseline: 1/28: 13 seconds  4/14: 10.72 seconds no hands  6/11: 10 seconds no hands  12/05/23: 14.44 sec no hands 01/02/24: 11.71 sec hands free; 9.90sec 02/27/24: 10.80 secs hands free Goal status: PROGRESSING  3.  Patient will increase six minute walk test distance to >1000 for progression to community ambulator and improve gait ability Baseline: Perform next session 3/4: 820 ft  4/14: 890 ft with [facility]   6/11: 888 ft with [facility]  7/14: 870 ft with SPC- only one instance of L foot catching on ground throughout test 12/05/23: 818 ft with SPC and new [employer] shoes 01/02/24: 975ft c SPC and [employer] shoes 02/27/24: 920' with SPC and [employer] shoes Goal status: Partially Met   4.  Patient will return to nightly ambulations with less than five foot scuffs per ambulation to return to PLOF.  Baseline: 1/28: limited ambulation  4/14: some limitations pending on day. Encouraged to perform multiple short walks  6/11: started doing nightly walks ; decreased foot scuffs   7/14: pt reports no scuffs Goal status: MET  5. Patient will increase ABC scale score >80% to demonstrate better functional mobility and better confidence with ADLs.  Baseline: 1/28: 60%   4/14: 65%   6/11: 68%  7/14: 59.38% 12/05/23: 56.88% 01/02/24: pending 02/27/24: 49.38% Goal status: Partially Met  6.  Pt will complete 30  minutes of low-impact cardio exercise (such as stationary cycling) 3 times per week for the next 6 weeks, gradually increasing the duration by 5 minutes every 2 weeks, to improve my cardiovascular endurance while monitoring for signs of fatigue or overheating. Baseline: 10/22/23: pt able to achieve 1 bout of 20 min in a week 12/05/23: Pt able to achieve 2 bouts of 25 minutes last week.   02/27/24: Pt denies performing the stationary cycling and just walking the dog.  Goal Status: PROGRESSING   ASSESSMENT:  CLINICAL IMPRESSION:    Patient will continue to benefit from skilled physical therapy to improve functional mobility for independence. Some goals performed on 02/27/24, two sessions prior , please refer to this note for functional progression and information. HEP progressed with new HEP addressed with patient demonstrating understanding. Pt will continue to benefit from skilled therapy to address remaining deficits in order to improve overall QoL and return to PLOF.       OBJECTIVE IMPAIRMENTS: Abnormal gait, decreased activity tolerance, decreased balance, decreased coordination, decreased endurance, decreased mobility, difficulty walking, decreased strength, impaired perceived functional ability, impaired flexibility, impaired UE functional use, and improper body mechanics.   ACTIVITY LIMITATIONS: carrying, lifting, bending, sitting, standing, squatting, stairs, transfers, bed mobility, dressing, reach over head, hygiene/grooming, locomotion level, and caring for others  PARTICIPATION LIMITATIONS: meal prep, cleaning, laundry, interpersonal relationship, driving, shopping, community activity, and occupation  PERSONAL FACTORS: Age, Fitness, Past/current experiences, Profession, Time since onset of injury/illness/exacerbation, and 1-2 comorbidities: MS anxiety are also affecting patient's functional outcome.  REHAB POTENTIAL: Good CLINICAL DECISION MAKING: Evolving/moderate  complexity EVALUATION COMPLEXITY: Moderate  PLAN:  PT FREQUENCY: 2x/week PT DURATION: 12 weeks PLANNED INTERVENTIONS: 97164- PT Re-evaluation, 97110-Therapeutic exercises, 97530- Therapeutic activity, 97112- Neuromuscular re-education, 97535- Self Care, 02859- Manual therapy, [id number]- Gait training, [phone]- Orthotic Fit/training, [phone]- Canalith repositioning, Patient/Family education, Balance training, Stair training, Taping, Dry  Needling, Joint mobilization, Joint manipulation, Spinal mobilization, Vestibular training, Visual/preceptual remediation/compensation, DME instructions, Cryotherapy, and Moist heat  PLAN FOR NEXT SESSION:    airex pad/balance, LLE strengthening, HEP , LLE coordination. Adding cognitive/manual dual task with dynamic balance activities   Will plan to discharge at end of the year, and perform an evaluation on 04/03/24 when pt knows the amount of visits she will be allotted for the new year.   Francia Verry  Leopoldo, PT, DPT Physical Therapist - [facility]  Outpatient Physical Therapy- Main Campus [phone]     03/26/2024, 3:29 PM  "

## 2024-03-26 ENCOUNTER — Ambulatory Visit

## 2024-03-26 DIAGNOSIS — R262 Difficulty in walking, not elsewhere classified: Secondary | ICD-10-CM

## 2024-03-26 DIAGNOSIS — R269 Unspecified abnormalities of gait and mobility: Secondary | ICD-10-CM

## 2024-03-26 DIAGNOSIS — R2681 Unsteadiness on feet: Secondary | ICD-10-CM

## 2024-03-26 DIAGNOSIS — M6281 Muscle weakness (generalized): Secondary | ICD-10-CM

## 2024-03-31 ENCOUNTER — Ambulatory Visit

## 2024-04-01 ENCOUNTER — Ambulatory Visit (INDEPENDENT_AMBULATORY_CARE_PROVIDER_SITE_OTHER): Admitting: Dermatology

## 2024-04-01 DIAGNOSIS — L821 Other seborrheic keratosis: Secondary | ICD-10-CM | POA: Diagnosis not present

## 2024-04-01 DIAGNOSIS — D225 Melanocytic nevi of trunk: Secondary | ICD-10-CM | POA: Diagnosis not present

## 2024-04-01 DIAGNOSIS — D2271 Melanocytic nevi of right lower limb, including hip: Secondary | ICD-10-CM | POA: Diagnosis not present

## 2024-04-01 DIAGNOSIS — L814 Other melanin hyperpigmentation: Secondary | ICD-10-CM | POA: Diagnosis not present

## 2024-04-01 DIAGNOSIS — Z85828 Personal history of other malignant neoplasm of skin: Secondary | ICD-10-CM

## 2024-04-01 DIAGNOSIS — D229 Melanocytic nevi, unspecified: Secondary | ICD-10-CM

## 2024-04-01 DIAGNOSIS — L820 Inflamed seborrheic keratosis: Secondary | ICD-10-CM | POA: Diagnosis not present

## 2024-04-01 DIAGNOSIS — L578 Other skin changes due to chronic exposure to nonionizing radiation: Secondary | ICD-10-CM | POA: Diagnosis not present

## 2024-04-01 DIAGNOSIS — L719 Rosacea, unspecified: Secondary | ICD-10-CM

## 2024-04-01 DIAGNOSIS — L570 Actinic keratosis: Secondary | ICD-10-CM | POA: Diagnosis not present

## 2024-04-01 DIAGNOSIS — Z1283 Encounter for screening for malignant neoplasm of skin: Secondary | ICD-10-CM

## 2024-04-01 DIAGNOSIS — D2272 Melanocytic nevi of left lower limb, including hip: Secondary | ICD-10-CM

## 2024-04-01 DIAGNOSIS — W908XXA Exposure to other nonionizing radiation, initial encounter: Secondary | ICD-10-CM | POA: Diagnosis not present

## 2024-04-01 DIAGNOSIS — D2262 Melanocytic nevi of left upper limb, including shoulder: Secondary | ICD-10-CM | POA: Diagnosis not present

## 2024-04-01 DIAGNOSIS — D1801 Hemangioma of skin and subcutaneous tissue: Secondary | ICD-10-CM

## 2024-04-01 MED ORDER — ORACEA 40 MG PO CPDR: 40.0000 mg | DELAYED_RELEASE_CAPSULE | Freq: Every day | ORAL | 3 refills | Status: AC

## 2024-04-01 MED ORDER — IVERMECTIN 1 % EX CREA: TOPICAL_CREAM | CUTANEOUS | 5 refills | Status: AC

## 2024-04-01 NOTE — Patient Instructions (Addendum)
 Photodynamic Therapy- Blue or Red Light Therapy  Actinic keratoses are the dry, red scaly spots on the skin caused by sun damage. A portion of these spots can turn into skin cancer with time, and treating them can help prevent development of skin cancer.   Treatment of these spots requires removal of the defective skin cells. There are various ways to remove actinic keratoses, including freezing with liquid nitrogen, treatment with creams, or treatment with a blue light procedure in the office.   Photodynamic Therapy (PDT), also known as blue or red light therapy is an in office procedure used to treat actinic keratoses. It works by targeting precancerous cells. After treatment, these cells peel off and are replaced by healthy ones.   For your phototherapy appointment, you will have two appointments on the day of your treatment. The first appointment will be to apply a cream to the treatment area. You will leave this cream on for 1-2 hours depending on the area being treated. The second appointment will be to shine a blue or red light on the area for 16-20 minutes to kill off the precancer cells. It is common to experience a burning sensation during the treatment.  After your treatment, it will be important to keep the treated areas of skin out of the sun completely for 48-72 hours (2-3 days) to prevent having a reaction.   Common side effects include: - Burning or stinging, which may be severe and can last up to 24-72 hours after your treatment - Scaling and crusting which may last up to 2 weeks - Redness, swelling and/or peeling which can last up to 4 weeks  To Care for Your Skin After PDT/Blue/Red Light Therapy: - Wash with soap, water and shampoo as normal. - If needed, you can use cold compresses (e.g. ice packs) for comfort - If okay with your primary care doctor, you may use analgesics such as acetaminophen (tylenol) every 4-6 hours, not to exceed recommended dose - You may apply  Cerave Healing Ointment, Vaseline or Aquaphor as needed - If you have a lot of swelling you may take a Benadryl to help with this (this may cause drowsiness), not to exceed recommended dose. This may increase the risk of falls in people over 65 and may slow reaction time while driving, so it is not recommended to take before driving or operating machinery. - Sun Precautions - Wear a wide brim hat for the next week if outside  - Wear a sunblock with zinc or titanium dioxide at least SPF 50 daily  If you have any questions or concerns, please call the office and ask to speak with a nurse.      Cryotherapy Aftercare  Wash gently with soap and water everyday.   Apply Vaseline and Band-Aid daily until healed.   Due to recent changes in healthcare laws, you may see results of your pathology and/or laboratory studies on MyChart before the doctors have had a chance to review them. We understand that in some cases there may be results that are confusing or concerning to you. Please understand that not all results are received at the same time and often the doctors may need to interpret multiple results in order to provide you with the best plan of care or course of treatment. Therefore, we ask that you please give us  2 business days to thoroughly review all your results before contacting the office for clarification. Should we see a critical lab result, you will be contacted sooner.  If You Need Anything After Your Visit  If you have any questions or concerns for your doctor, please call our main line at [phone] and press option 4 to reach your doctor's medical assistant. If no one answers, please leave a voicemail as directed and we will return your call as soon as possible. Messages left after 4 pm will be answered the following business day.   You may also send us  a message via MyChart. We typically respond to MyChart messages within 1-2 business days.  For prescription refills, please ask  your pharmacy to contact our office. Our fax number is [phone].  If you have an urgent issue when the clinic is closed that cannot wait until the next business day, you can page your doctor at the number below.    Please note that while we do our best to be available for urgent issues outside of office hours, we are not available 24/7.   If you have an urgent issue and are unable to reach us , you may choose to seek medical care at your doctor's office, retail clinic, urgent care center, or emergency room.  If you have a medical emergency, please immediately call 911 or go to the emergency department.  Pager Numbers  - Dr. Hester: [phone]  - Dr. Jackquline: [phone]  - Dr. Claudene: [phone]   - Dr. Raymund: [phone]  In the event of inclement weather, please call our main line at [phone] for an update on the status of any delays or closures.  Dermatology Medication Tips: Please keep the boxes that topical medications come in in order to help keep track of the instructions about where and how to use these. Pharmacies typically print the medication instructions only on the boxes and not directly on the medication tubes.   If your medication is too expensive, please contact our office at [phone] option 4 or send us  a message through MyChart.   We are unable to tell what your co-pay for medications will be in advance as this is different depending on your insurance coverage. However, we may be able to find a substitute medication at lower cost or fill out paperwork to get insurance to cover a needed medication.   If a prior authorization is required to get your medication covered by your insurance company, please allow us  1-2 business days to complete this process.  Drug prices often vary depending on where the prescription is filled and some pharmacies may offer cheaper prices.  The website www.goodrx.com contains coupons for medications through different  pharmacies. The prices here do not account for what the cost may be with help from insurance (it may be cheaper with your insurance), but the website can give you the price if you did not use any insurance.  - You can print the associated coupon and take it with your prescription to the pharmacy.  - You may also stop by our office during regular business hours and pick up a GoodRx coupon card.  - If you need your prescription sent electronically to a different pharmacy, notify our office through [facility] or by phone at [phone] option 4.     Si Usted Necesita Algo Despus de Su Visita  Tambin puede enviarnos un mensaje a travs de Clinical Cytogeneticist. Por lo general respondemos a los mensajes de MyChart en el transcurso de 1 a 2 das hbiles.  Para renovar recetas, por favor pida a su farmacia que se ponga en contacto con nuestra oficina. Nuestro nmero de  fax es el [phone].  Si tiene un asunto urgente cuando la clnica est cerrada y que no puede esperar hasta el siguiente da hbil, puede llamar/localizar a su doctor(a) al nmero que aparece a continuacin.   Por favor, tenga en cuenta que aunque hacemos todo lo posible para estar disponibles para asuntos urgentes fuera del horario de [place], no estamos disponibles las 24 horas del da, los 7 [address]  [phone] de la [place].   Si tiene un problema urgente y no puede comunicarse con nosotros, puede optar por buscar atencin mdica  en el consultorio de su doctor(a), en una clnica privada, en un centro de atencin urgente o en una sala de emergencias.  Si tiene engineer, drilling, por favor llame inmediatamente al 911 o vaya a la sala de emergencias.  Nmeros de bper  - Dr. Hester: [phone]  - Dra. Jackquline: [phone]  - Dr. Claudene: [phone]  - Dra. Kitts: [phone]  En caso de inclemencias del [place], por favor llame a nuestra lnea principal al [phone] para una actualizacin sobre el estado de cualquier retraso o  cierre.  Consejos para la medicacin en dermatologa: Por favor, guarde las cajas en las que vienen los medicamentos de uso tpico para ayudarle a seguir las instrucciones sobre dnde y cmo usarlos. Las farmacias generalmente imprimen las instrucciones del medicamento slo en las cajas y no directamente en los tubos del [place].   Si su medicamento es muy caro, por favor, pngase en contacto con landry rieger llamando al [phone] y presione la opcin 4 o envenos un mensaje a travs de Clinical Cytogeneticist.   No podemos decirle cul ser su copago por los medicamentos por adelantado ya que esto es diferente dependiendo de la cobertura de su seguro. Sin embargo, es posible que podamos encontrar un medicamento sustituto a audiological scientist un formulario para que el seguro cubra el medicamento que se considera necesario.   Si se requiere una autorizacin previa para que su compaa de seguros cubra su medicamento, por favor permtanos de 1 a 2 das hbiles para completar este proceso.  Los precios de los medicamentos varan con frecuencia dependiendo del environmental consultant de dnde se surte la receta y alguna farmacias pueden ofrecer precios ms baratos.  El sitio web www.goodrx.com tiene cupones para medicamentos de health and safety inspector. Los precios aqu no tienen en cuenta lo que podra costar con la ayuda del seguro (puede ser ms barato con su seguro), pero el sitio web puede darle el precio si no utiliz tourist information centre manager.  - Puede imprimir el cupn correspondiente y llevarlo con su receta a la farmacia.  - Tambin puede pasar por nuestra oficina durante el horario de atencin regular y education officer, museum una tarjeta de cupones de GoodRx.  - Si necesita que su receta se enve electrnicamente a una farmacia diferente, informe a nuestra oficina a travs de MyChart de [place] o por telfono llamando al [phone] y presione la opcin 4.

## 2024-04-01 NOTE — Progress Notes (Signed)
 "  Follow-Up Visit   Subjective  RILIE GLANZ is a 54 y.o. female who presents for the following: Skin Cancer Screening and Full Body Skin Exam; hx of BCC's and AK's. Patient reports not areas of concern.   The patient presents for Total-Body Skin Exam (TBSE) for skin cancer screening and mole check. The patient has spots, moles and lesions to be evaluated, some may be new or changing and the patient may have concern these could be cancer.  New pink spot on left foot dorsum    The following portions of the chart were reviewed this encounter and updated as appropriate: medications, allergies, medical history  Review of Systems:  No other skin or systemic complaints except as noted in HPI or Assessment and Plan.  Objective  Well appearing patient in no apparent distress; mood and affect are within normal limits.  A full examination was performed including scalp, head, eyes, ears, nose, lips, neck, chest, axillae, abdomen, back, buttocks, bilateral upper extremities, bilateral lower extremities, hands, feet, fingers, toes, fingernails, and toenails. All findings within normal limits unless otherwise noted below.   Relevant physical exam findings are noted in the Assessment and Plan.  Left Foot Dorsum x1, Left medial calf x1 (2) Stuck on waxy papules with erythema  Assessment & Plan   SKIN CANCER SCREENING PERFORMED TODAY.  ACTINIC DAMAGE - Chronic condition, secondary to cumulative UV/sun exposure - diffuse scaly erythematous macules with underlying dyspigmentation - Recommend daily broad spectrum sunscreen SPF 30+ to sun-exposed areas, reapply every 2 hours as needed.  - Staying in the shade or wearing long sleeves, sun glasses (UVA+UVB protection) and wide brim hats (4-inch brim around the entire circumference of the hat) are also recommended for sun protection.  - Call for new or changing lesions.  LENTIGINES, SEBORRHEIC KERATOSES, HEMANGIOMAS - Benign normal skin lesions -  Benign-appearing - Call for any changes  SEBORRHEIC KERATOSIS - 0.7 cm gray brown macule at right medial breast, stable - Benign-appearing - Discussed benign etiology and prognosis. - Observe - Call for any changes   MELANOCYTIC NEVI - Tan-brown and/or pink-flesh-colored symmetric macules and papules - 5 x 3 mm medium brown macule at left mid back - 4 mm brown macule with darker center at left posterior shoulder - 2.5 mm med dark brown macule at right medial knee - 4 mm speckled brown macule at left lateral heel - Benign appearing on exam today - Observation - Call clinic for new or changing moles - Recommend daily use of broad spectrum spf 30+ sunscreen to sun-exposed areas.   HISTORY OF BASAL CELL CARCINOMA OF THE SKIN - No evidence of recurrence today - Recommend regular full body skin exams - Recommend daily broad spectrum sunscreen SPF 30+ to sun-exposed areas, reapply every 2 hours as needed.  - Call if any new or changing lesions are noted between office visits  ACTINIC DAMAGE WITH PRECANCEROUS ACTINIC KERATOSES Counseling for Topical Chemotherapy Management: Patient exhibits: - Severe, confluent actinic changes with pre-cancerous actinic keratoses that is secondary to cumulative UV radiation exposure over time - Condition that is severe; chronic, not at goal. - diffuse scaly erythematous macules and papules with underlying dyspigmentation - Discussed Prescription Field Treatment topical Chemotherapy for Severe, Chronic Confluent Actinic Changes with Pre-Cancerous Actinic Keratoses Field treatment involves treatment of an entire area of skin that has confluent Actinic Changes (Sun/ Ultraviolet light damage) and PreCancerous Actinic Keratoses by method of PhotoDynamic Therapy (PDT) and/or prescription Topical Chemotherapy agents such as 5-fluorouracil,  5-fluorouracil/calcipotriene, and/or imiquimod.  The purpose is to decrease the number of clinically evident and subclinical  PreCancerous lesions to prevent progression to development of skin cancer by chemically destroying early precancer changes that may or may not be visible.  It has been shown to reduce the risk of developing skin cancer in the treated area. As a result of treatment, redness, scaling, crusting, and open sores may occur during treatment course. One or more than one of these methods may be used and may have to be used several times to control, suppress and eliminate the PreCancerous changes. Discussed treatment course, expected reaction, and possible side effects. - Recommend daily broad spectrum sunscreen SPF 30+ to sun-exposed areas, reapply every 2 hours as needed.  - Staying in the shade or wearing long sleeves, sun glasses (UVA+UVB protection) and wide brim hats (4-inch brim around the entire circumference of the hat) are also recommended. - Call for new or changing lesions.  - Recommend red light PDT with debridement to face.  Patient will schedule.     EARLY BCC vs Inflamed SK Exam: L lower medial calf with 4 mm pink slightly pearly papule, stable compared to photo from 09/17/22  - Treated with cryotherapy (see below) in clinic with possible biopsy at next appointment if not resolved INFLAMED SEBORRHEIC KERATOSIS (2) Left Foot Dorsum x1, Left medial calf x1 (2) Symptomatic, irritating, patient would like treated.  Vs early BCC L medial calf, recheck on f/up - Destruction of lesion - Left Foot Dorsum x1, Left medial calf x1 (2)  Destruction method: cryotherapy   Informed consent: discussed and consent obtained   Lesion destroyed using liquid nitrogen: Yes   Region frozen until ice ball extended beyond lesion: Yes   Cryo cycles: 1 or 2. Outcome: patient tolerated procedure well with no complications   Post-procedure details: wound care instructions given    ROSACEA   This Visit - Doxycycline , Rosacea, (ORACEA ) 40 MG CPDR - Take 40 mg by mouth daily. - Ivermectin  (SOOLANTRA ) 1 % CREA -  Apply to affected areas rosacea every night. Existing Treatments - Azelaic Acid  15 % gel - Apply 1 application topically 2 (two) times daily. After skin is thoroughly washed and patted dry, gently but thoroughly massage a thin film of azelaic acid  cream into the affected area twice daily, in the morning and evening. - metroNIDAZOLE  (METROCREAM ) 0.75 % cream - Apply to affected areas face every morning for rosacea.  ROSACEA Exam: Clear on exam in clinic    Chronic condition with duration or expected duration over one year. Currently well-controlled.   Rosacea is a chronic progressive skin condition usually affecting the face of adults, causing redness and/or acne bumps. It is treatable but not curable. It sometimes affects the eyes (ocular rosacea) as well. It may respond to topical and/or systemic medication and can flare with stress, sun exposure, alcohol, exercise, topical steroids (including hydrocortisone /cortisone 10) and some foods.  Daily application of broad spectrum spf 30+ sunscreen to face is recommended to reduce flares.   Treatment Plan D/C Azelaic acid  gel D/C metronidazole  0.75% cream Apply to aa face qam dsp 45g 5Rf.  Continue Soolantra  cream Apply to aa face every night dsp 45g 5Rf.  Continue Oracea  40 MG take 1 po every day with food dsp #30 5Rf. Doxycycline  should be taken with food to prevent nausea. Do not lay down for 30 minutes after taking. Be cautious with sun exposure and use good sun protection while on this medication. Pregnant women  should not take this medication.    Recommend daily broad spectrum sunscreen SPF 30+ to sun-exposed areas, reapply every 2 hours as needed. Call for new or changing lesions.  Staying in the shade or wearing long sleeves, sun glasses (UVA+UVB protection) and wide brim hats (4-inch brim around the entire circumference of the hat) are also recommended for sun protection.    Return in about 6 months (around 09/29/2024) for TBSE, red light PDT  with debridement to face.  I, Emerick Ege, CMA am acting as scribe for Michelle Rattler, MD.   Documentation: I have reviewed the above documentation for accuracy and completeness, and I agree with the above.  Michelle Rattler, MD    "

## 2024-04-02 ENCOUNTER — Ambulatory Visit

## 2024-04-02 NOTE — Therapy (Signed)
 " OUTPATIENT PHYSICAL THERAPY TREATMENT  Patient Name: Michelle Ruiz MRN: [id number] DOB:1970/07/11, 54 y.o., female Today's Date: 04/03/2024  PCP: Sherial Bail  REFERRING PROVIDER: Maree Hila K   END OF SESSION:   PT End of Session - 04/03/24 1445     Visit Number 58    Number of Visits 81    Date for Recertification  06/18/24    Progress Note Due on Visit 50    PT Start Time 1445    PT Stop Time 1529    PT Time Calculation (min) 44 min    Equipment Utilized During Treatment Gait belt    Activity Tolerance Patient tolerated treatment well;No increased pain    Behavior During Therapy WFL for tasks assessed/performed              Past Medical History:  Diagnosis Date   Abnormal Pap smear of cervix    Actinic keratosis 09/17/2023   R spinal mid upper back   Anxiety    Basal cell carcinoma 04/14/2008   Right upper ant. thigh. BCC with sclerosis.   Basal cell carcinoma 08/14/2008   Right lat. lower thigh. Superficial.    Basal cell carcinoma 04/29/2019   Right mid forearm. Superficial and nodular patterns. EDC.   Basal cell carcinoma 08/17/2020   Right upper antecubital. EDC.   Basal cell carcinoma 08/23/2021   Left Lower Leg, EDC   Basal cell carcinoma 09/17/2023   L medial lower leg, EDC   Kidney stones    MS (multiple sclerosis)    UTI (urinary tract infection)    Past Surgical History:  Procedure Laterality Date   BREAST BIOPSY Right 2010   stereotactic biopsy/ neg/ dr.byrnett   BREAST BIOPSY Left 2012   neg/dr. brynett   CESAREAN SECTION  2008   RPH   TONSILLECTOMY  2006   There are no active problems to display for this patient.  ONSET DATE: diagnosed 12 years ago.   REFERRING DIAG: MS, gait and mobility   THERAPY DIAG:  Difficulty in walking, not elsewhere classified  Abnormality of gait and mobility  Unsteadiness on feet  Muscle weakness (generalized)  Rationale for Evaluation and Treatment: Rehabilitation  SUBJECTIVE:                                                                                                                                                                                              SUBJECTIVE STATEMENT:  Patient has questions on HEP. Was sick over the weekend. Patient went to neuro today.   Pt accompanied by: self  PERTINENT HISTORY: Patient was seen by physical therapy until  September of last year. Pmh includes MS, UTI, anxiety. Has extreme spasticity in LLE. Extreme cold and heat affect her LLE. Patient is now taking hormonal medication. Patient is a chartered certified accountant.   PAIN:  Are you having pain? No  PRECAUTIONS: None  RED FLAGS: None   WEIGHT BEARING RESTRICTIONS: No  FALLS: Has patient fallen in last 6 months? No  LIVING ENVIRONMENT: Lives with: lives with their family Lives in: House/apartment Stairs: no stairs to enter, lives on second floor Has following equipment at home: Single point cane and shower chair  PLOF: Independent  PATIENT GOALS: continue walking as long as she can.   OBJECTIVE:  Note: Objective measures were completed at Evaluation unless otherwise noted.  LOWER EXTREMITY MMT:    MMT Right Eval Left Eval  Hip flexion 6.4 4.2  Hip extension    Hip abduction 7.9 3.4  Hip adduction 7.5 8.5  Hip internal rotation    Hip external rotation    Knee flexion 11.1 10.2  Knee extension 7.8 10  Ankle dorsiflexion 10.8 6.8  Ankle plantarflexion 7.2 6.9  Ankle inversion    Ankle eversion    (Blank rows = not tested)   FUNCTIONAL TESTS:  5 times sit to stand: 13 seconds with arms crossed.  6 minute walk test: next session  Functional gait assessment: 14/30  PATIENT SURVEYS:  ABC scale 60.5%                                                                                                                               TREATMENT DATE: 04/03/2024   TA- To improve functional movements patterns for everyday tasks  Velocity changes from fast to slow  based on PT guidance 300 ft with increased L foot shuffle with fatigue   TE- To improve strength, endurance, mobility, and function of specific targeted muscle groups or improve joint range of motion or improve muscle flexibility  Seated adduction with IR/ER 15x; 2 sets with ball Heel toe raises 15x with adduction ball squeeze  LAQ with adduction 15 ball   NMR: To facilitate reeducation of movement, balance, posture, coordination, and/or proprioception/kinesthetic sense. SLS 30 seconds x 2 trials each LE Tandem stance 2x30 seconds each LE   PATIENT EDUCATION: Education details: Exercise technique; importance of daily stretching Person educated: Patient Education method: Explanation, Demonstration, Tactile cues, and Verbal cues Education comprehension: verbalized understanding, returned demonstration, verbal cues required, and tactile cues required   HOME EXERCISE PROGRAM: Access Code: [email or web address] URL: [email or web address] Date: 08/13/2023 Prepared by: Reyes London  Exercises - Hooklying Single Knee to Chest Stretch  - 1-2 x daily - 3 sets - 30 sec  hold - Supine Lower Trunk Rotation  - 1-2 x daily - 3 sets - 30 sec hold - Supine Hamstring Stretch with Strap  - 1-2 x daily - 3 sets - 30 sec hold - Seated Hamstring Stretch  - 1-2 x daily - 3 sets - 30 sec  hold - Supine Hip Internal and External Rotation  - 1-2 x daily - 3 sets - 10 reps - Seated Hip External Rotation Stretch  - 1 x daily - 3 sets - 30 sec hold - Supine Figure 4 Piriformis Stretch  - 1 x daily - 3 sets - 30 sec hold  Access Code: [email or web address] URL: [email or web address] Date: 03/26/2024 Prepared by: Giulietta Prokop  Exercises - Squat with [employer]  - 1 x daily - 7 x weekly - 2 sets - 10 reps - 5 hold - Half Deadlift with Kettlebell  - 1 x daily - 7 x weekly - 2 sets - 10 reps - 5 hold - Upright Side Lunge  - 1 x daily - 7 x weekly - 2 sets - 10 reps - 5 hold - Mini  Lunge  - 1 x daily - 7 x weekly - 2 sets - 10 reps - 5 hold  GOALS:  Goals reviewed with patient? Yes  SHORT TERM GOALS: Target date: 05/22/2023  Patient will be independent in home exercise program to improve strength/mobility for better functional independence with ADLs. Baseline: 4/14: compliant  Goal status: MET   LONG TERM GOALS: Target date: 06/18/2024    Patient will increase Functional Gait Assessment score to >25/30 as to reduce fall risk and improve dynamic gait safety with community ambulation. Baseline: 1/28: 14/30   4/14: 17/30   6/11: 18/30  7/14: 20/30 12/05/23:    01/02/24: 20/30 12/31: 24/30 Goal status: Partially Met  2.   Patient (< 28 years old) will complete five times sit to stand test in <10 seconds indicating an increased LE strength and improved balance. Baseline: 1/28: 13 seconds  4/14: 10.72 seconds no hands  6/11: 10 seconds no hands  12/05/23: 14.44 sec no hands 01/02/24: 11.71 sec hands free; 9.90sec 02/27/24: 10.80 secs hands free Goal status: PROGRESSING  3.  Patient will increase six minute walk test distance to >1000 for progression to community ambulator and improve gait ability Baseline: Perform next session 3/4: 820 ft  4/14: 890 ft with [facility]   6/11: 888 ft with [facility]  7/14: 870 ft with SPC- only one instance of L foot catching on ground throughout test 12/05/23: 818 ft with SPC and new [employer] shoes 01/02/24: 982ft c SPC and [employer] shoes 02/27/24: 920' with SPC and [employer] shoes Goal status: Partially Met   4.  Patient will return to nightly ambulations with less than five foot scuffs per ambulation to return to PLOF.  Baseline: 1/28: limited ambulation  4/14: some limitations pending on day. Encouraged to perform multiple short walks  6/11: started doing nightly walks ; decreased foot scuffs   7/14: pt reports no scuffs Goal status: MET  5. Patient will increase ABC scale score >80% to demonstrate better functional mobility and better  confidence with ADLs.  Baseline: 1/28: 60%   4/14: 65%   6/11: 68%  7/14: 59.38% 12/05/23: 56.88% 01/02/24: pending 02/27/24: 49.38% Goal status: Partially Met  6.  Pt will complete 30 minutes of low-impact cardio exercise (such as stationary cycling) 3 times per week for the next 6 weeks, gradually increasing the duration by 5 minutes every 2 weeks, to improve my cardiovascular endurance while monitoring for signs of fatigue or overheating. Baseline: 10/22/23: pt able to achieve 1 bout of 20 min in a week 12/05/23: Pt able to achieve 2 bouts of 25 minutes last week.   02/27/24: Pt denies performing the stationary cycling and just  walking the dog.  Goal Status: PROGRESSING   ASSESSMENT:  CLINICAL IMPRESSION:   Patient is highly motivated throughout session. Dual task strengthening is challenging for patient but tolerated well. Change of speed is functional although stability is challenging. Pt will continue to benefit from skilled therapy to address remaining deficits in order to improve overall QoL and return to PLOF.       OBJECTIVE IMPAIRMENTS: Abnormal gait, decreased activity tolerance, decreased balance, decreased coordination, decreased endurance, decreased mobility, difficulty walking, decreased strength, impaired perceived functional ability, impaired flexibility, impaired UE functional use, and improper body mechanics.   ACTIVITY LIMITATIONS: carrying, lifting, bending, sitting, standing, squatting, stairs, transfers, bed mobility, dressing, reach over head, hygiene/grooming, locomotion level, and caring for others  PARTICIPATION LIMITATIONS: meal prep, cleaning, laundry, interpersonal relationship, driving, shopping, community activity, and occupation  PERSONAL FACTORS: Age, Fitness, Past/current experiences, Profession, Time since onset of injury/illness/exacerbation, and 1-2 comorbidities: MS anxiety are also affecting patient's functional outcome.  REHAB POTENTIAL:  Good CLINICAL DECISION MAKING: Evolving/moderate complexity EVALUATION COMPLEXITY: Moderate  PLAN:  PT FREQUENCY: 2x/week PT DURATION: 12 weeks PLANNED INTERVENTIONS: 97164- PT Re-evaluation, 97110-Therapeutic exercises, 97530- Therapeutic activity, 97112- Neuromuscular re-education, 97535- Self Care, 02859- Manual therapy, [phone]- Gait training, [phone]- Orthotic Fit/training, [phone]- Canalith repositioning, Patient/Family education, Balance training, Stair training, Taping, Dry Needling, Joint mobilization, Joint manipulation, Spinal mobilization, Vestibular training, Visual/preceptual remediation/compensation, DME instructions, Cryotherapy, and Moist heat  PLAN FOR NEXT SESSION:    airex pad/balance, LLE strengthening, HEP , LLE coordination. Adding cognitive/manual dual task with dynamic balance activities      Lexxie Winberg  Leopoldo, PT, DPT Physical Therapist - [facility]  Outpatient Physical Therapy- Main Campus [phone]     04/03/2024, 3:33 PM  "

## 2024-04-03 ENCOUNTER — Ambulatory Visit: Attending: Neurology

## 2024-04-03 DIAGNOSIS — R269 Unspecified abnormalities of gait and mobility: Secondary | ICD-10-CM | POA: Insufficient documentation

## 2024-04-03 DIAGNOSIS — R262 Difficulty in walking, not elsewhere classified: Secondary | ICD-10-CM | POA: Insufficient documentation

## 2024-04-03 DIAGNOSIS — M6281 Muscle weakness (generalized): Secondary | ICD-10-CM | POA: Insufficient documentation

## 2024-04-03 DIAGNOSIS — R2681 Unsteadiness on feet: Secondary | ICD-10-CM | POA: Insufficient documentation

## 2024-04-07 ENCOUNTER — Ambulatory Visit

## 2024-04-08 ENCOUNTER — Other Ambulatory Visit: Payer: Self-pay | Admitting: Neurology

## 2024-04-08 DIAGNOSIS — G35A Relapsing-remitting multiple sclerosis: Secondary | ICD-10-CM

## 2024-04-09 ENCOUNTER — Ambulatory Visit

## 2024-04-10 ENCOUNTER — Ambulatory Visit

## 2024-04-14 ENCOUNTER — Ambulatory Visit

## 2024-04-14 NOTE — Therapy (Incomplete)
 " OUTPATIENT PHYSICAL THERAPY TREATMENT  Patient Name: Michelle Ruiz MRN: [id number] DOB:May 25, 1970, 54 y.o., female Today's Date: 04/14/2024  PCP: Sherial Bail  REFERRING PROVIDER: Maree Hila K   END OF SESSION:         Past Medical History:  Diagnosis Date   Abnormal Pap smear of cervix    Actinic keratosis 09/17/2023   R spinal mid upper back   Anxiety    Basal cell carcinoma 04/14/2008   Right upper ant. thigh. BCC with sclerosis.   Basal cell carcinoma 08/14/2008   Right lat. lower thigh. Superficial.    Basal cell carcinoma 04/29/2019   Right mid forearm. Superficial and nodular patterns. EDC.   Basal cell carcinoma 08/17/2020   Right upper antecubital. EDC.   Basal cell carcinoma 08/23/2021   Left Lower Leg, EDC   Basal cell carcinoma 09/17/2023   L medial lower leg, EDC   Kidney stones    MS (multiple sclerosis)    UTI (urinary tract infection)    Past Surgical History:  Procedure Laterality Date   BREAST BIOPSY Right 2010   stereotactic biopsy/ neg/ dr.byrnett   BREAST BIOPSY Left 2012   neg/dr. brynett   CESAREAN SECTION  2008   RPH   TONSILLECTOMY  2006   There are no active problems to display for this patient.  ONSET DATE: diagnosed 12 years ago.   REFERRING DIAG: MS, gait and mobility   THERAPY DIAG:  No diagnosis found.  Rationale for Evaluation and Treatment: Rehabilitation  SUBJECTIVE:                                                                                                                                                                                             SUBJECTIVE STATEMENT:  ***  Pt accompanied by: self  PERTINENT HISTORY: Patient was seen by physical therapy until September of last year. Pmh includes MS, UTI, anxiety. Has extreme spasticity in LLE. Extreme cold and heat affect her LLE. Patient is now taking hormonal medication. Patient is a chartered certified accountant.   PAIN:  Are you having pain?  No  PRECAUTIONS: None  RED FLAGS: None   WEIGHT BEARING RESTRICTIONS: No  FALLS: Has patient fallen in last 6 months? No  LIVING ENVIRONMENT: Lives with: lives with their family Lives in: House/apartment Stairs: no stairs to enter, lives on second floor Has following equipment at home: Single point cane and shower chair  PLOF: Independent  PATIENT GOALS: continue walking as long as she can.   OBJECTIVE:  Note: Objective measures were completed at Evaluation unless otherwise noted.  LOWER EXTREMITY MMT:  MMT Right Eval Left Eval  Hip flexion 6.4 4.2  Hip extension    Hip abduction 7.9 3.4  Hip adduction 7.5 8.5  Hip internal rotation    Hip external rotation    Knee flexion 11.1 10.2  Knee extension 7.8 10  Ankle dorsiflexion 10.8 6.8  Ankle plantarflexion 7.2 6.9  Ankle inversion    Ankle eversion    (Blank rows = not tested)   FUNCTIONAL TESTS:  5 times sit to stand: 13 seconds with arms crossed.  6 minute walk test: next session  Functional gait assessment: 14/30  PATIENT SURVEYS:  ABC scale 60.5%                                                                                                                               TREATMENT DATE: 04/14/24   TA- To improve functional movements patterns for everyday tasks  Velocity changes from fast to slow based on PT guidance 300 ft with increased L foot shuffle with fatigue   TE- To improve strength, endurance, mobility, and function of specific targeted muscle groups or improve joint range of motion or improve muscle flexibility  Seated adduction with IR/ER 15x; 2 sets with ball Heel toe raises 15x with adduction ball squeeze  LAQ with adduction 15 ball   NMR: To facilitate reeducation of movement, balance, posture, coordination, and/or proprioception/kinesthetic sense. SLS 30 seconds x 2 trials each LE Tandem stance 2x30 seconds each LE   PATIENT EDUCATION: Education details: Exercise technique;  importance of daily stretching Person educated: Patient Education method: Explanation, Demonstration, Tactile cues, and Verbal cues Education comprehension: verbalized understanding, returned demonstration, verbal cues required, and tactile cues required   HOME EXERCISE PROGRAM: Access Code: [email or web address] URL: [email or web address] Date: 08/13/2023 Prepared by: Reyes London  Exercises - Hooklying Single Knee to Chest Stretch  - 1-2 x daily - 3 sets - 30 sec  hold - Supine Lower Trunk Rotation  - 1-2 x daily - 3 sets - 30 sec hold - Supine Hamstring Stretch with Strap  - 1-2 x daily - 3 sets - 30 sec hold - Seated Hamstring Stretch  - 1-2 x daily - 3 sets - 30 sec hold - Supine Hip Internal and External Rotation  - 1-2 x daily - 3 sets - 10 reps - Seated Hip External Rotation Stretch  - 1 x daily - 3 sets - 30 sec hold - Supine Figure 4 Piriformis Stretch  - 1 x daily - 3 sets - 30 sec hold  Access Code: [email or web address] URL: [email or web address] Date: 03/26/2024 Prepared by: Maricsa Sammons  Exercises - Squat with [employer]  - 1 x daily - 7 x weekly - 2 sets - 10 reps - 5 hold - Half Deadlift with Kettlebell  - 1 x daily - 7 x weekly - 2 sets - 10 reps - 5 hold - Upright Side Lunge  - 1 x daily - 7 x weekly -  2 sets - 10 reps - 5 hold - Mini Lunge  - 1 x daily - 7 x weekly - 2 sets - 10 reps - 5 hold  GOALS:  Goals reviewed with patient? Yes  SHORT TERM GOALS: Target date: 05/22/2023  Patient will be independent in home exercise program to improve strength/mobility for better functional independence with ADLs. Baseline: 4/14: compliant  Goal status: MET   LONG TERM GOALS: Target date: 06/18/2024    Patient will increase Functional Gait Assessment score to >25/30 as to reduce fall risk and improve dynamic gait safety with community ambulation. Baseline: 1/28: 14/30   4/14: 17/30   6/11: 18/30  7/14: 20/30 12/05/23:    01/02/24:  20/30 12/31: 24/30 Goal status: Partially Met  2.   Patient (< 31 years old) will complete five times sit to stand test in <10 seconds indicating an increased LE strength and improved balance. Baseline: 1/28: 13 seconds  4/14: 10.72 seconds no hands  6/11: 10 seconds no hands  12/05/23: 14.44 sec no hands 01/02/24: 11.71 sec hands free; 9.90sec 02/27/24: 10.80 secs hands free Goal status: PROGRESSING  3.  Patient will increase six minute walk test distance to >1000 for progression to community ambulator and improve gait ability Baseline: Perform next session 3/4: 820 ft  4/14: 890 ft with [facility]   6/11: 888 ft with [facility]  7/14: 870 ft with SPC- only one instance of L foot catching on ground throughout test 12/05/23: 818 ft with SPC and new [employer] shoes 01/02/24: 962ft c SPC and [employer] shoes 02/27/24: 920' with SPC and [employer] shoes Goal status: Partially Met   4.  Patient will return to nightly ambulations with less than five foot scuffs per ambulation to return to PLOF.  Baseline: 1/28: limited ambulation  4/14: some limitations pending on day. Encouraged to perform multiple short walks  6/11: started doing nightly walks ; decreased foot scuffs   7/14: pt reports no scuffs Goal status: MET  5. Patient will increase ABC scale score >80% to demonstrate better functional mobility and better confidence with ADLs.  Baseline: 1/28: 60%   4/14: 65%   6/11: 68%  7/14: 59.38% 12/05/23: 56.88% 01/02/24: pending 02/27/24: 49.38% Goal status: Partially Met  6.  Pt will complete 30 minutes of low-impact cardio exercise (such as stationary cycling) 3 times per week for the next 6 weeks, gradually increasing the duration by 5 minutes every 2 weeks, to improve my cardiovascular endurance while monitoring for signs of fatigue or overheating. Baseline: 10/22/23: pt able to achieve 1 bout of 20 min in a week 12/05/23: Pt able to achieve 2 bouts of 25 minutes last week.   02/27/24: Pt denies performing the  stationary cycling and just walking the dog.  Goal Status: PROGRESSING   ASSESSMENT:  CLINICAL IMPRESSION:   *** Pt will continue to benefit from skilled therapy to address remaining deficits in order to improve overall QoL and return to PLOF.       OBJECTIVE IMPAIRMENTS: Abnormal gait, decreased activity tolerance, decreased balance, decreased coordination, decreased endurance, decreased mobility, difficulty walking, decreased strength, impaired perceived functional ability, impaired flexibility, impaired UE functional use, and improper body mechanics.   ACTIVITY LIMITATIONS: carrying, lifting, bending, sitting, standing, squatting, stairs, transfers, bed mobility, dressing, reach over head, hygiene/grooming, locomotion level, and caring for others  PARTICIPATION LIMITATIONS: meal prep, cleaning, laundry, interpersonal relationship, driving, shopping, community activity, and occupation  PERSONAL FACTORS: Age, Fitness, Past/current experiences, Profession, Time since onset of injury/illness/exacerbation, and  1-2 comorbidities: MS anxiety are also affecting patient's functional outcome.  REHAB POTENTIAL: Good CLINICAL DECISION MAKING: Evolving/moderate complexity EVALUATION COMPLEXITY: Moderate  PLAN:  PT FREQUENCY: 2x/week PT DURATION: 12 weeks PLANNED INTERVENTIONS: 97164- PT Re-evaluation, 97110-Therapeutic exercises, 97530- Therapeutic activity, 97112- Neuromuscular re-education, 97535- Self Care, 02859- Manual therapy, [phone]- Gait training, [phone]- Orthotic Fit/training, [phone]- Canalith repositioning, Patient/Family education, Balance training, Stair training, Taping, Dry Needling, Joint mobilization, Joint manipulation, Spinal mobilization, Vestibular training, Visual/preceptual remediation/compensation, DME instructions, Cryotherapy, and Moist heat  PLAN FOR NEXT SESSION:    airex pad/balance, LLE strengthening, HEP , LLE coordination. Adding cognitive/manual dual task with  dynamic balance activities      Lalania Haseman  Leopoldo, PT, DPT Physical Therapist - [facility]  Outpatient Physical Therapy- Main Campus [phone]     04/14/24, 8:27 AM  "

## 2024-04-16 ENCOUNTER — Ambulatory Visit

## 2024-04-16 NOTE — Therapy (Signed)
 " OUTPATIENT PHYSICAL THERAPY TREATMENT  Patient Name: Michelle Ruiz MRN: [id number] DOB:1970-06-23, 54 y.o., female Today's Date: 04/17/2024  PCP: Sherial Bail  REFERRING PROVIDER: Maree Hila K   END OF SESSION:   PT End of Session - 04/17/24 1311     Visit Number 59    Number of Visits 81    Date for Recertification  06/18/24    Progress Note Due on Visit 50    PT Start Time 1315    PT Stop Time 1359    PT Time Calculation (min) 44 min    Equipment Utilized During Treatment Gait belt    Activity Tolerance Patient tolerated treatment well;No increased pain    Behavior During Therapy WFL for tasks assessed/performed               Past Medical History:  Diagnosis Date   Abnormal Pap smear of cervix    Actinic keratosis 09/17/2023   R spinal mid upper back   Anxiety    Basal cell carcinoma 04/14/2008   Right upper ant. thigh. BCC with sclerosis.   Basal cell carcinoma 08/14/2008   Right lat. lower thigh. Superficial.    Basal cell carcinoma 04/29/2019   Right mid forearm. Superficial and nodular patterns. EDC.   Basal cell carcinoma 08/17/2020   Right upper antecubital. EDC.   Basal cell carcinoma 08/23/2021   Left Lower Leg, EDC   Basal cell carcinoma 09/17/2023   L medial lower leg, EDC   Kidney stones    MS (multiple sclerosis)    UTI (urinary tract infection)    Past Surgical History:  Procedure Laterality Date   BREAST BIOPSY Right 2010   stereotactic biopsy/ neg/ dr.byrnett   BREAST BIOPSY Left 2012   neg/dr. brynett   CESAREAN SECTION  2008   RPH   TONSILLECTOMY  2006   There are no active problems to display for this patient.  ONSET DATE: diagnosed 12 years ago.   REFERRING DIAG: MS, gait and mobility   THERAPY DIAG:  Difficulty in walking, not elsewhere classified  Abnormality of gait and mobility  Unsteadiness on feet  Muscle weakness (generalized)  Rationale for Evaluation and Treatment: Rehabilitation  SUBJECTIVE:                                                                                                                                                                                              SUBJECTIVE STATEMENT:  Patient reports the cold weather is affecting her. Patient fell Friday due to her puppy tripping her. Was able to control her fall to fall safely.   Pt accompanied  by: self  PERTINENT HISTORY: Patient was seen by physical therapy until September of last year. Pmh includes MS, UTI, anxiety. Has extreme spasticity in LLE. Extreme cold and heat affect her LLE. Patient is now taking hormonal medication. Patient is a chartered certified accountant.   PAIN:  Are you having pain? No  PRECAUTIONS: None  RED FLAGS: None   WEIGHT BEARING RESTRICTIONS: No  FALLS: Has patient fallen in last 6 months? No  LIVING ENVIRONMENT: Lives with: lives with their family Lives in: House/apartment Stairs: no stairs to enter, lives on second floor Has following equipment at home: Single point cane and shower chair  PLOF: Independent  PATIENT GOALS: continue walking as long as she can.   OBJECTIVE:  Note: Objective measures were completed at Evaluation unless otherwise noted.  LOWER EXTREMITY MMT:    MMT Right Eval Left Eval  Hip flexion 6.4 4.2  Hip extension    Hip abduction 7.9 3.4  Hip adduction 7.5 8.5  Hip internal rotation    Hip external rotation    Knee flexion 11.1 10.2  Knee extension 7.8 10  Ankle dorsiflexion 10.8 6.8  Ankle plantarflexion 7.2 6.9  Ankle inversion    Ankle eversion    (Blank rows = not tested)   FUNCTIONAL TESTS:  5 times sit to stand: 13 seconds with arms crossed.  6 minute walk test: next session  Functional gait assessment: 14/30  PATIENT SURVEYS:  ABC scale 60.5%                                                                                                                               TREATMENT DATE: 04/17/24   TA- To improve functional movements  patterns for everyday tasks  Velocity changes from fast to slow based on PT guidance 300 ft with increased L foot shuffle with fatigue 5x STS with cues for weight shift    TE- To improve strength, endurance, mobility, and function of specific targeted muscle groups or improve joint range of motion or improve muscle flexibility Scapular retractions 10x  GTB abduction 15x  GTB march with arms crossed 15x  GTB hamstring curl 15x each side    NMR: To facilitate reeducation of movement, balance, posture, coordination, and/or proprioception/kinesthetic sense. SLS 30 seconds x 2 trials each LE airex pad  Airex pad dual task scan and place letters for reaching x multiple minutes  Tandem stance 2x30 seconds each LE  Incline stance 30 seconds x 3   PATIENT EDUCATION: Education details: Exercise technique; importance of daily stretching Person educated: Patient Education method: Explanation, Demonstration, Tactile cues, and Verbal cues Education comprehension: verbalized understanding, returned demonstration, verbal cues required, and tactile cues required   HOME EXERCISE PROGRAM: Access Code: [email or web address] URL: [email or web address] Date: 08/13/2023 Prepared by: Reyes London  Exercises - Hooklying Single Knee to Chest Stretch  - 1-2 x daily - 3 sets - 30 sec  hold - Supine Lower Trunk Rotation  - 1-2  x daily - 3 sets - 30 sec hold - Supine Hamstring Stretch with Strap  - 1-2 x daily - 3 sets - 30 sec hold - Seated Hamstring Stretch  - 1-2 x daily - 3 sets - 30 sec hold - Supine Hip Internal and External Rotation  - 1-2 x daily - 3 sets - 10 reps - Seated Hip External Rotation Stretch  - 1 x daily - 3 sets - 30 sec hold - Supine Figure 4 Piriformis Stretch  - 1 x daily - 3 sets - 30 sec hold  Access Code: [email or web address] URL: [email or web address] Date: 03/26/2024 Prepared by: Branda Chaudhary  Exercises - Squat with [employer]  - 1 x daily - 7 x  weekly - 2 sets - 10 reps - 5 hold - Half Deadlift with Kettlebell  - 1 x daily - 7 x weekly - 2 sets - 10 reps - 5 hold - Upright Side Lunge  - 1 x daily - 7 x weekly - 2 sets - 10 reps - 5 hold - Mini Lunge  - 1 x daily - 7 x weekly - 2 sets - 10 reps - 5 hold  GOALS:  Goals reviewed with patient? Yes  SHORT TERM GOALS: Target date: 05/22/2023  Patient will be independent in home exercise program to improve strength/mobility for better functional independence with ADLs. Baseline: 4/14: compliant  Goal status: MET   LONG TERM GOALS: Target date: 06/18/2024    Patient will increase Functional Gait Assessment score to >25/30 as to reduce fall risk and improve dynamic gait safety with community ambulation. Baseline: 1/28: 14/30   4/14: 17/30   6/11: 18/30  7/14: 20/30 12/05/23:    01/02/24: 20/30 12/31: 24/30 Goal status: Partially Met  2.   Patient (< 60 years old) will complete five times sit to stand test in <10 seconds indicating an increased LE strength and improved balance. Baseline: 1/28: 13 seconds  4/14: 10.72 seconds no hands  6/11: 10 seconds no hands  12/05/23: 14.44 sec no hands 01/02/24: 11.71 sec hands free; 9.90sec 02/27/24: 10.80 secs hands free Goal status: PROGRESSING  3.  Patient will increase six minute walk test distance to >1000 for progression to community ambulator and improve gait ability Baseline: Perform next session 3/4: 820 ft  4/14: 890 ft with [facility]   6/11: 888 ft with [facility]  7/14: 870 ft with SPC- only one instance of L foot catching on ground throughout test 12/05/23: 818 ft with SPC and new [employer] shoes 01/02/24: 913ft c SPC and [employer] shoes 02/27/24: 920' with SPC and [employer] shoes Goal status: Partially Met   4.  Patient will return to nightly ambulations with less than five foot scuffs per ambulation to return to PLOF.  Baseline: 1/28: limited ambulation  4/14: some limitations pending on day. Encouraged to perform multiple short walks  6/11:  started doing nightly walks ; decreased foot scuffs   7/14: pt reports no scuffs Goal status: MET  5. Patient will increase ABC scale score >80% to demonstrate better functional mobility and better confidence with ADLs.  Baseline: 1/28: 60%   4/14: 65%   6/11: 68%  7/14: 59.38% 12/05/23: 56.88% 01/02/24: pending 02/27/24: 49.38% Goal status: Partially Met  6.  Pt will complete 30 minutes of low-impact cardio exercise (such as stationary cycling) 3 times per week for the next 6 weeks, gradually increasing the duration by 5 minutes every 2 weeks, to improve my cardiovascular endurance while monitoring for  signs of fatigue or overheating. Baseline: 10/22/23: pt able to achieve 1 bout of 20 min in a week 12/05/23: Pt able to achieve 2 bouts of 25 minutes last week.   02/27/24: Pt denies performing the stationary cycling and just walking the dog.  Goal Status: PROGRESSING   ASSESSMENT:  CLINICAL IMPRESSION:   Patient presents with excellent motivation. She is slightly rattled from recent fall over her dog. Stability on unstable surface tolerated well with patient fatiguing but remaining focused. She is challenged with gait speed changes but is able to perform with cGA and no LOB.  Pt will continue to benefit from skilled therapy to address remaining deficits in order to improve overall QoL and return to PLOF.       OBJECTIVE IMPAIRMENTS: Abnormal gait, decreased activity tolerance, decreased balance, decreased coordination, decreased endurance, decreased mobility, difficulty walking, decreased strength, impaired perceived functional ability, impaired flexibility, impaired UE functional use, and improper body mechanics.   ACTIVITY LIMITATIONS: carrying, lifting, bending, sitting, standing, squatting, stairs, transfers, bed mobility, dressing, reach over head, hygiene/grooming, locomotion level, and caring for others  PARTICIPATION LIMITATIONS: meal prep, cleaning, laundry, interpersonal  relationship, driving, shopping, community activity, and occupation  PERSONAL FACTORS: Age, Fitness, Past/current experiences, Profession, Time since onset of injury/illness/exacerbation, and 1-2 comorbidities: MS anxiety are also affecting patient's functional outcome.  REHAB POTENTIAL: Good CLINICAL DECISION MAKING: Evolving/moderate complexity EVALUATION COMPLEXITY: Moderate  PLAN:  PT FREQUENCY: 2x/week PT DURATION: 12 weeks PLANNED INTERVENTIONS: 97164- PT Re-evaluation, 97110-Therapeutic exercises, 97530- Therapeutic activity, 97112- Neuromuscular re-education, 97535- Self Care, [id number]- Manual therapy, [phone]- Gait training, [phone]- Orthotic Fit/training, [phone]- Canalith repositioning, Patient/Family education, Balance training, Stair training, Taping, Dry Needling, Joint mobilization, Joint manipulation, Spinal mobilization, Vestibular training, Visual/preceptual remediation/compensation, DME instructions, Cryotherapy, and Moist heat  PLAN FOR NEXT SESSION:    airex pad/balance, LLE strengthening, HEP , LLE coordination. Adding cognitive/manual dual task with dynamic balance activities      Hazelle Woollard  Leopoldo, PT, DPT Physical Therapist - [facility]  Outpatient Physical Therapy- Main [facility] [phone]     04/17/24, 1:59 PM  "

## 2024-04-17 ENCOUNTER — Ambulatory Visit

## 2024-04-17 DIAGNOSIS — R2681 Unsteadiness on feet: Secondary | ICD-10-CM

## 2024-04-17 DIAGNOSIS — M6281 Muscle weakness (generalized): Secondary | ICD-10-CM

## 2024-04-17 DIAGNOSIS — R262 Difficulty in walking, not elsewhere classified: Secondary | ICD-10-CM

## 2024-04-17 DIAGNOSIS — R269 Unspecified abnormalities of gait and mobility: Secondary | ICD-10-CM

## 2024-04-20 ENCOUNTER — Ambulatory Visit

## 2024-04-21 ENCOUNTER — Ambulatory Visit

## 2024-04-23 ENCOUNTER — Ambulatory Visit

## 2024-04-23 DIAGNOSIS — M6281 Muscle weakness (generalized): Secondary | ICD-10-CM

## 2024-04-23 DIAGNOSIS — R269 Unspecified abnormalities of gait and mobility: Secondary | ICD-10-CM

## 2024-04-23 DIAGNOSIS — R2681 Unsteadiness on feet: Secondary | ICD-10-CM

## 2024-04-23 DIAGNOSIS — R262 Difficulty in walking, not elsewhere classified: Secondary | ICD-10-CM

## 2024-04-23 NOTE — Therapy (Signed)
 " OUTPATIENT PHYSICAL THERAPY TREATMENT/ Physical Therapy Progress Note   Dates of reporting period  01/16/24   to   04/23/24    Patient Name: Sireen E Kosel MRN: [id number] DOB:06-Jan-1971, 54 y.o., female Today's Date: 04/23/2024  PCP: Sherial Bail  REFERRING PROVIDER: Maree Hila K   END OF SESSION:   PT End of Session - 04/23/24 1315     Visit Number 60    Number of Visits 81    Date for Recertification  06/18/24    Progress Note Due on Visit 50    PT Start Time 1315    PT Stop Time 1359    PT Time Calculation (min) 44 min    Equipment Utilized During Treatment Gait belt    Activity Tolerance Patient tolerated treatment well;No increased pain    Behavior During Therapy WFL for tasks assessed/performed                Past Medical History:  Diagnosis Date   Abnormal Pap smear of cervix    Actinic keratosis 09/17/2023   R spinal mid upper back   Anxiety    Basal cell carcinoma 04/14/2008   Right upper ant. thigh. BCC with sclerosis.   Basal cell carcinoma 08/14/2008   Right lat. lower thigh. Superficial.    Basal cell carcinoma 04/29/2019   Right mid forearm. Superficial and nodular patterns. EDC.   Basal cell carcinoma 08/17/2020   Right upper antecubital. EDC.   Basal cell carcinoma 08/23/2021   Left Lower Leg, EDC   Basal cell carcinoma 09/17/2023   L medial lower leg, EDC   Kidney stones    MS (multiple sclerosis)    UTI (urinary tract infection)    Past Surgical History:  Procedure Laterality Date   BREAST BIOPSY Right 2010   stereotactic biopsy/ neg/ dr.byrnett   BREAST BIOPSY Left 2012   neg/dr. brynett   CESAREAN SECTION  2008   RPH   TONSILLECTOMY  2006   There are no active problems to display for this patient.  ONSET DATE: diagnosed 12 years ago.   REFERRING DIAG: MS, gait and mobility   THERAPY DIAG:  Difficulty in walking, not elsewhere classified  Abnormality of gait and mobility  Unsteadiness on feet  Muscle  weakness (generalized)  Rationale for Evaluation and Treatment: Rehabilitation  SUBJECTIVE:                                                                                                                                                                                             SUBJECTIVE STATEMENT:  Patient reports feeling very stiff from the colder weather.  Hasn't been able to walk around due to the ice.   Pt accompanied by: self  PERTINENT HISTORY: Patient was seen by physical therapy until September of last year. Pmh includes MS, UTI, anxiety. Has extreme spasticity in LLE. Extreme cold and heat affect her LLE. Patient is now taking hormonal medication. Patient is a chartered certified accountant.   PAIN:  Are you having pain? No  PRECAUTIONS: None  RED FLAGS: None   WEIGHT BEARING RESTRICTIONS: No  FALLS: Has patient fallen in last 6 months? No  LIVING ENVIRONMENT: Lives with: lives with their family Lives in: House/apartment Stairs: no stairs to enter, lives on second floor Has following equipment at home: Single point cane and shower chair  PLOF: Independent  PATIENT GOALS: continue walking as long as she can.   OBJECTIVE:  Note: Objective measures were completed at Evaluation unless otherwise noted.  LOWER EXTREMITY MMT:    MMT Right Eval Left Eval  Hip flexion 6.4 4.2  Hip extension    Hip abduction 7.9 3.4  Hip adduction 7.5 8.5  Hip internal rotation    Hip external rotation    Knee flexion 11.1 10.2  Knee extension 7.8 10  Ankle dorsiflexion 10.8 6.8  Ankle plantarflexion 7.2 6.9  Ankle inversion    Ankle eversion    (Blank rows = not tested)   FUNCTIONAL TESTS:  5 times sit to stand: 13 seconds with arms crossed.  6 minute walk test: next session  Functional gait assessment: 14/30  PATIENT SURVEYS:  ABC scale 60.5%                                                                                                                                TREATMENT DATE: 04/23/24    Physical therapy treatment session today consisted of completing assessment of goals and administration of testing as demonstrated and documented in flow sheet, treatment, and goals section of this note. Addition treatments may be found below.     Interventions Performed:   FGA:  [facility] PT Assessment - 04/23/24 0001       Functional Gait  Assessment   Gait assessed  Yes    Gait Level Surface Walks 20 ft in less than 7 sec but greater than 5.5 sec, uses assistive device, slower speed, mild gait deviations, or deviates 6-10 in outside of the 12 in walkway width.    Change in Gait Speed Able to change speed, demonstrates mild gait deviations, deviates 6-10 in outside of the 12 in walkway width, or no gait deviations, unable to achieve a major change in velocity, or uses a change in velocity, or uses an assistive device.    Gait with Horizontal Head Turns Performs head turns smoothly with slight change in gait velocity (eg, minor disruption to smooth gait path), deviates 6-10 in outside 12 in walkway width, or uses an assistive device.    Gait with Vertical Head Turns Performs task with slight change  in gait velocity (eg, minor disruption to smooth gait path), deviates 6 - 10 in outside 12 in walkway width or uses assistive device    Gait and Pivot Turn Pivot turns safely in greater than 3 sec and stops with no loss of balance, or pivot turns safely within 3 sec and stops with mild imbalance, requires small steps to catch balance.    Step Over Obstacle Is able to step over one shoe box (4.5 in total height) without changing gait speed. No evidence of imbalance.    Gait with Narrow Base of Support Ambulates 7-9 steps.    Gait with Eyes Closed Walks 20 ft, uses assistive device, slower speed, mild gait deviations, deviates 6-10 in outside 12 in walkway width. Ambulates 20 ft in less than 9 sec but greater than 7 sec.    Ambulating Backwards Walks 20 ft, uses assistive  device, slower speed, mild gait deviations, deviates 6-10 in outside 12 in walkway width.    Steps Alternating feet, must use rail.    Total Score 20          6 Min Walk Test:  Instructed patient to ambulate as quickly and as safely as possible for 6 minutes using LRAD. Patient was allowed to take standing rest breaks without stopping the test, but if the patient required a sitting rest break the clock would be stopped and the test would be over.  Results: 900 feet  using a SPC with CGA. Results indicate that the patient has reduced endurance with ambulation compared to age matched norms.  Age Matched Norms: 41-69 yo M: 41 F: 34, 17-79 yo M: 33 F: 471, 40-89 yo M: 417 F: 392 MDC: 58.21 meters (190.98 feet) or 50 meters (ANPTA Core Set of Outcome Measures for Adults with Neurologic Conditions, 2018)  Pt performed 5 time sit<>stand (5xSTS): 10.3 sec (>15 sec indicates increased fall risk)  ABC:  48   TA- To improve functional movements patterns for everyday tasks  6 step: toe taps to black mark on step finger tip support 15x each LE 6 step lateral toe tap to black mark 15x each LE 3 way hip hike 10x each LE with UE support   PATIENT EDUCATION: Education details: Exercise technique; importance of daily stretching Person educated: Patient Education method: Explanation, Demonstration, Tactile cues, and Verbal cues Education comprehension: verbalized understanding, returned demonstration, verbal cues required, and tactile cues required   HOME EXERCISE PROGRAM: Access Code: [email or web address] URL: [email or web address] Date: 08/13/2023 Prepared by: Reyes London  Exercises - Hooklying Single Knee to Chest Stretch  - 1-2 x daily - 3 sets - 30 sec  hold - Supine Lower Trunk Rotation  - 1-2 x daily - 3 sets - 30 sec hold - Supine Hamstring Stretch with Strap  - 1-2 x daily - 3 sets - 30 sec hold - Seated Hamstring Stretch  - 1-2 x daily - 3 sets - 30 sec hold - Supine  Hip Internal and External Rotation  - 1-2 x daily - 3 sets - 10 reps - Seated Hip External Rotation Stretch  - 1 x daily - 3 sets - 30 sec hold - Supine Figure 4 Piriformis Stretch  - 1 x daily - 3 sets - 30 sec hold  Access Code: [email or web address] URL: [email or web address] Date: 03/26/2024 Prepared by: Eusevio Schriver  Exercises - Squat with [employer]  - 1 x daily - 7 x weekly - 2 sets - 10 reps - 5 hold - Half Deadlift  with Kettlebell  - 1 x daily - 7 x weekly - 2 sets - 10 reps - 5 hold - Upright Side Lunge  - 1 x daily - 7 x weekly - 2 sets - 10 reps - 5 hold - Mini Lunge  - 1 x daily - 7 x weekly - 2 sets - 10 reps - 5 hold  GOALS:  Goals reviewed with patient? Yes  SHORT TERM GOALS: Target date: 05/22/2023  Patient will be independent in home exercise program to improve strength/mobility for better functional independence with ADLs. Baseline: 4/14: compliant  Goal status: MET   LONG TERM GOALS: Target date: 06/18/2024    Patient will increase Functional Gait Assessment score to >25/30 as to reduce fall risk and improve dynamic gait safety with community ambulation. Baseline: 1/28: 14/30   4/14: 17/30   6/11: 18/30  7/14: 20/30 12/05/23:    01/02/24: 20/30 12/31: 24/30 1/28: 20/30-affected by the cold  Goal status: Partially Met  2.   Patient (< 51 years old) will complete five times sit to stand test in <10 seconds indicating an increased LE strength and improved balance. Baseline: 1/28: 13 seconds  4/14: 10.72 seconds no hands  6/11: 10 seconds no hands  12/05/23: 14.44 sec no hands 01/02/24: 11.71 sec hands free; 9.90sec 02/27/24: 10.80 secs hands free 1/28; 10.3 seconds hands free  Goal status: PROGRESSING  3.  Patient will increase six minute walk test distance to >1000 for progression to community ambulator and improve gait ability Baseline: Perform next session 3/4: 820 ft  4/14: 890 ft with [facility]   6/11: 888 ft with [facility]  7/14: 870 ft  with SPC- only one instance of L foot catching on ground throughout test 12/05/23: 818 ft with SPC and new [employer] shoes 01/02/24: 952ft c SPC and [employer] shoes 02/27/24: 920' with SPC and [employer] shoes 1/28: 900 ft with SPC -cold weather affecting mobility  Goal status: Partially Met   4.  Patient will return to nightly ambulations with less than five foot scuffs per ambulation to return to PLOF.  Baseline: 1/28: limited ambulation  4/14: some limitations pending on day. Encouraged to perform multiple short walks  6/11: started doing nightly walks ; decreased foot scuffs   7/14: pt reports no scuffs Goal status: MET  5. Patient will increase ABC scale score >80% to demonstrate better functional mobility and better confidence with ADLs.  Baseline: 1/28: 60%   4/14: 65%   6/11: 68%  7/14: 59.38% 12/05/23: 56.88% 01/02/24: pending 02/27/24: 49.38% 1/28: 48%  Goal status: Partially Met  6.  Pt will complete 30 minutes of low-impact cardio exercise (such as stationary cycling) 3 times per week for the next 6 weeks, gradually increasing the duration by 5 minutes every 2 weeks, to improve my cardiovascular endurance while monitoring for signs of fatigue or overheating. Baseline: 10/22/23: pt able to achieve 1 bout of 20 min in a week 12/05/23: Pt able to achieve 2 bouts of 25 minutes last week.   02/27/24: Pt denies performing the stationary cycling and just walking the dog. 1/28: has not been able to do walking due to ice and its too cold to workout in garage at this time per patient report   Goal Status: PROGRESSING   ASSESSMENT:  CLINICAL IMPRESSION:   Patient's condition has the potential to improve in response to therapy. Maximum improvement is yet to be obtained. The anticipated improvement is attainable and reasonable in a generally predictable time.  The  patient is a 54 year old female with MS who presents today with increased tone and gait instability, which she reports is worsened by  cold temperatures and limited outdoor mobility due to icy conditions. Her Functional Gait Assessment score of 20 indicates notable gait impairments, particularly with dynamic balance tasks, variable gait speeds, and head turns. Her 6 Minute Walk Test distance of 900 feet using a SPC with contact guard assistance demonstrates reduced endurance compared to age matched norms, and her ABC score of 48 suggests low balance confidence. Despite these challenges, her 5xSTS time of 10.3 seconds shows preserved functional lower extremity strength. Continued physical therapy is beneficial to address the patients current decline in mobility, manage increased tone, and improve dynamic balance and gait mechanics to reduce fall risk. Ongoing skilled intervention can also support endurance training, improve confidence with functional mobility, and maintain safety during ambulation as symptoms fluctuate with environmental and disease related factors.     OBJECTIVE IMPAIRMENTS: Abnormal gait, decreased activity tolerance, decreased balance, decreased coordination, decreased endurance, decreased mobility, difficulty walking, decreased strength, impaired perceived functional ability, impaired flexibility, impaired UE functional use, and improper body mechanics.   ACTIVITY LIMITATIONS: carrying, lifting, bending, sitting, standing, squatting, stairs, transfers, bed mobility, dressing, reach over head, hygiene/grooming, locomotion level, and caring for others  PARTICIPATION LIMITATIONS: meal prep, cleaning, laundry, interpersonal relationship, driving, shopping, community activity, and occupation  PERSONAL FACTORS: Age, Fitness, Past/current experiences, Profession, Time since onset of injury/illness/exacerbation, and 1-2 comorbidities: MS anxiety are also affecting patient's functional outcome.  REHAB POTENTIAL: Good CLINICAL DECISION MAKING: Evolving/moderate complexity EVALUATION COMPLEXITY: Moderate  PLAN:  PT  FREQUENCY: 2x/week PT DURATION: 12 weeks PLANNED INTERVENTIONS: 97164- PT Re-evaluation, 97110-Therapeutic exercises, 97530- Therapeutic activity, 97112- Neuromuscular re-education, 97535- Self Care, [id number]- Manual therapy, [phone]- Gait training, [phone]- Orthotic Fit/training, [phone]- Canalith repositioning, Patient/Family education, Balance training, Stair training, Taping, Dry Needling, Joint mobilization, Joint manipulation, Spinal mobilization, Vestibular training, Visual/preceptual remediation/compensation, DME instructions, Cryotherapy, and Moist heat  PLAN FOR NEXT SESSION:    airex pad/balance, LLE strengthening, HEP , LLE coordination. Adding cognitive/manual dual task with dynamic balance activities      Nafis Farnan  Leopoldo, PT, DPT Physical Therapist - [facility]  Outpatient Physical Therapy- Main Campus [phone]     04/23/24, 2:14 PM  "

## 2024-04-24 ENCOUNTER — Ambulatory Visit

## 2024-04-27 ENCOUNTER — Ambulatory Visit

## 2024-04-28 ENCOUNTER — Ambulatory Visit

## 2024-04-30 ENCOUNTER — Ambulatory Visit

## 2024-04-30 DIAGNOSIS — M6281 Muscle weakness (generalized): Secondary | ICD-10-CM

## 2024-04-30 DIAGNOSIS — R262 Difficulty in walking, not elsewhere classified: Secondary | ICD-10-CM

## 2024-04-30 DIAGNOSIS — R2681 Unsteadiness on feet: Secondary | ICD-10-CM

## 2024-04-30 DIAGNOSIS — Z9181 History of falling: Secondary | ICD-10-CM

## 2024-04-30 DIAGNOSIS — R269 Unspecified abnormalities of gait and mobility: Secondary | ICD-10-CM

## 2024-04-30 DIAGNOSIS — M25521 Pain in right elbow: Secondary | ICD-10-CM

## 2024-04-30 DIAGNOSIS — M25621 Stiffness of right elbow, not elsewhere classified: Secondary | ICD-10-CM

## 2024-04-30 NOTE — Therapy (Signed)
 " OUTPATIENT PHYSICAL THERAPY TREATMENT    Patient Name: Michelle Ruiz MRN: [id number] DOB:Aug 11, 1970, 54 y.o., female Today's Date: 04/30/2024  PCP: Sherial Bail  REFERRING PROVIDER: Maree Hila K   END OF SESSION:   PT End of Session - 04/30/24 1318     Visit Number 61    Number of Visits 81    Date for Recertification  06/18/24    Progress Note Due on Visit 50    PT Start Time 1318    PT Stop Time 1400    PT Time Calculation (min) 42 min    Equipment Utilized During Treatment Gait belt    Activity Tolerance Patient tolerated treatment well;No increased pain    Behavior During Therapy WFL for tasks assessed/performed                Past Medical History:  Diagnosis Date   Abnormal Pap smear of cervix    Actinic keratosis 09/17/2023   R spinal mid upper back   Anxiety    Basal cell carcinoma 04/14/2008   Right upper ant. thigh. BCC with sclerosis.   Basal cell carcinoma 08/14/2008   Right lat. lower thigh. Superficial.    Basal cell carcinoma 04/29/2019   Right mid forearm. Superficial and nodular patterns. EDC.   Basal cell carcinoma 08/17/2020   Right upper antecubital. EDC.   Basal cell carcinoma 08/23/2021   Left Lower Leg, EDC   Basal cell carcinoma 09/17/2023   L medial lower leg, EDC   Kidney stones    MS (multiple sclerosis)    UTI (urinary tract infection)    Past Surgical History:  Procedure Laterality Date   BREAST BIOPSY Right 2010   stereotactic biopsy/ neg/ dr.byrnett   BREAST BIOPSY Left 2012   neg/dr. brynett   CESAREAN SECTION  2008   RPH   TONSILLECTOMY  2006   There are no active problems to display for this patient.  ONSET DATE: diagnosed 12 years ago.   REFERRING DIAG: MS, gait and mobility   THERAPY DIAG:  Difficulty in walking, not elsewhere classified  Abnormality of gait and mobility  Unsteadiness on feet  Muscle weakness (generalized)  History of falling  Stiffness of right elbow, not elsewhere  classified  Pain in right elbow  Rationale for Evaluation and Treatment: Rehabilitation  SUBJECTIVE:                                                                                                                                                                                             SUBJECTIVE STATEMENT:  Pt reports that she hasn't been able to do much  outside due to the weather.  Pt notes that she's a little scared to walk on the ice with her Cadense shoes.  Pt accompanied by: self  PERTINENT HISTORY: Patient was seen by physical therapy until September of last year. Pmh includes MS, UTI, anxiety. Has extreme spasticity in LLE. Extreme cold and heat affect her LLE. Patient is now taking hormonal medication. Patient is a chartered certified accountant.   PAIN:  Are you having pain? No  PRECAUTIONS: None  RED FLAGS: None   WEIGHT BEARING RESTRICTIONS: No  FALLS: Has patient fallen in last 6 months? No  LIVING ENVIRONMENT: Lives with: lives with their family Lives in: House/apartment Stairs: no stairs to enter, lives on second floor Has following equipment at home: Single point cane and shower chair  PLOF: Independent  PATIENT GOALS: continue walking as long as she can.   OBJECTIVE:  Note: Objective measures were completed at Evaluation unless otherwise noted.  LOWER EXTREMITY MMT:    MMT Right Eval Left Eval  Hip flexion 6.4 4.2  Hip extension    Hip abduction 7.9 3.4  Hip adduction 7.5 8.5  Hip internal rotation    Hip external rotation    Knee flexion 11.1 10.2  Knee extension 7.8 10  Ankle dorsiflexion 10.8 6.8  Ankle plantarflexion 7.2 6.9  Ankle inversion    Ankle eversion    (Blank rows = not tested)   FUNCTIONAL TESTS:  5 times sit to stand: 13 seconds with arms crossed.  6 minute walk test: next session  Functional gait assessment: 14/30  PATIENT SURVEYS:  ABC scale 60.5%                                                                                                                                TREATMENT DATE: 04/30/24   TA- To improve functional movements patterns for everyday tasks   2x10 STS with cues for weight shift, and rest break provided between sets    TE- To improve strength, endurance, mobility, and function of specific targeted muscle groups or improve joint range of motion or improve muscle flexibility  Scapular retractions 2x10 GTB abduction 2x15 GTB march with arms crossed 2x15 GTB hamstring curls 2x15 each side  Hip adduction with physioball, 3 sec squeezes, x15     NMR: To facilitate reeducation of movement, balance, posture, coordination, and/or proprioception/kinesthetic sense.  Tandem stance 2x30 seconds each LE  Incline stance 30 seconds x2 Tandem incline stance, 30 sec, x2 each LE leading Tandem rocks with airex pad under front of large wobbleboard, 2x10 rocks each LE leading    PATIENT EDUCATION: Education details: Exercise technique; importance of daily stretching Person educated: Patient Education method: Explanation, Demonstration, Tactile cues, and Verbal cues Education comprehension: verbalized understanding, returned demonstration, verbal cues required, and tactile cues required   HOME EXERCISE PROGRAM: Access Code: [email or web address] URL: [email or web address] Date: 08/13/2023 Prepared by: Reyes London  Exercises - Hooklying Single Knee to Chest Stretch  -  1-2 x daily - 3 sets - 30 sec  hold - Supine Lower Trunk Rotation  - 1-2 x daily - 3 sets - 30 sec hold - Supine Hamstring Stretch with Strap  - 1-2 x daily - 3 sets - 30 sec hold - Seated Hamstring Stretch  - 1-2 x daily - 3 sets - 30 sec hold - Supine Hip Internal and External Rotation  - 1-2 x daily - 3 sets - 10 reps - Seated Hip External Rotation Stretch  - 1 x daily - 3 sets - 30 sec hold - Supine Figure 4 Piriformis Stretch  - 1 x daily - 3 sets - 30 sec hold  Access Code: [email or web address] URL:  [email or web address] Date: 03/26/2024 Prepared by: Marina  Moser  Exercises - Squat with [employer]  - 1 x daily - 7 x weekly - 2 sets - 10 reps - 5 hold - Half Deadlift with Kettlebell  - 1 x daily - 7 x weekly - 2 sets - 10 reps - 5 hold - Upright Side Lunge  - 1 x daily - 7 x weekly - 2 sets - 10 reps - 5 hold - Mini Lunge  - 1 x daily - 7 x weekly - 2 sets - 10 reps - 5 hold  GOALS:  Goals reviewed with patient? Yes  SHORT TERM GOALS: Target date: 05/22/2023  Patient will be independent in home exercise program to improve strength/mobility for better functional independence with ADLs. Baseline: 4/14: compliant  Goal status: MET   LONG TERM GOALS: Target date: 06/18/2024    Patient will increase Functional Gait Assessment score to >25/30 as to reduce fall risk and improve dynamic gait safety with community ambulation. Baseline: 1/28: 14/30   4/14: 17/30   6/11: 18/30  7/14: 20/30 12/05/23:    01/02/24: 20/30 12/31: 24/30 1/28: 20/30-affected by the cold  Goal status: Partially Met  2.   Patient (< 54 years old) will complete five times sit to stand test in <10 seconds indicating an increased LE strength and improved balance. Baseline: 1/28: 13 seconds  4/14: 10.72 seconds no hands  6/11: 10 seconds no hands  12/05/23: 14.44 sec no hands 01/02/24: 11.71 sec hands free; 9.90sec 02/27/24: 10.80 secs hands free 1/28; 10.3 seconds hands free  Goal status: PROGRESSING  3.  Patient will increase six minute walk test distance to >1000 for progression to community ambulator and improve gait ability Baseline: Perform next session 3/4: 820 ft  4/14: 890 ft with [facility]   6/11: 888 ft with [facility]  7/14: 870 ft with SPC- only one instance of L foot catching on ground throughout test 12/05/23: 818 ft with SPC and new Cadense shoes 01/02/24: 963ft c SPC and Cadense shoes 02/27/24: 920' with SPC and Cadense shoes 1/28: 900 ft with SPC -cold weather affecting  mobility  Goal status: Partially Met   4.  Patient will return to nightly ambulations with less than five foot scuffs per ambulation to return to PLOF.  Baseline: 1/28: limited ambulation  4/14: some limitations pending on day. Encouraged to perform multiple short walks  6/11: started doing nightly walks ; decreased foot scuffs   7/14: pt reports no scuffs Goal status: MET  5. Patient will increase ABC scale score >80% to demonstrate better functional mobility and better confidence with ADLs.  Baseline: 1/28: 60%   4/14: 65%   6/11: 68%  7/14: 59.38% 12/05/23: 56.88% 01/02/24: pending 02/27/24: 49.38% 1/28: 48%  Goal status: Partially  Met  6.  Pt will complete 30 minutes of low-impact cardio exercise (such as stationary cycling) 3 times per week for the next 6 weeks, gradually increasing the duration by 5 minutes every 2 weeks, to improve my cardiovascular endurance while monitoring for signs of fatigue or overheating. Baseline: 10/22/23: pt able to achieve 1 bout of 20 min in a week 12/05/23: Pt able to achieve 2 bouts of 25 minutes last week.   02/27/24: Pt denies performing the stationary cycling and just walking the dog. 1/28: has not been able to do walking due to ice and its too cold to workout in garage at this time per patient report   Goal Status: PROGRESSING   ASSESSMENT:  CLINICAL IMPRESSION:    Pt responded well to the exercises and noted that she was sweating from the difficulty of the challenges.  Pt performed well with the tasks and was challenged most by the wobbleboard exercise.  Pt notes that she feels most unstable doing this and close CGA was utilized throughout this portion to keep pt safe from falls.  Pt ultimately was able to perform well and challenge her balance with the performance.   Pt will continue to benefit from skilled therapy to address remaining deficits in order to improve overall QoL and return to PLOF.        OBJECTIVE IMPAIRMENTS: Abnormal gait,  decreased activity tolerance, decreased balance, decreased coordination, decreased endurance, decreased mobility, difficulty walking, decreased strength, impaired perceived functional ability, impaired flexibility, impaired UE functional use, and improper body mechanics.   ACTIVITY LIMITATIONS: carrying, lifting, bending, sitting, standing, squatting, stairs, transfers, bed mobility, dressing, reach over head, hygiene/grooming, locomotion level, and caring for others  PARTICIPATION LIMITATIONS: meal prep, cleaning, laundry, interpersonal relationship, driving, shopping, community activity, and occupation  PERSONAL FACTORS: Age, Fitness, Past/current experiences, Profession, Time since onset of injury/illness/exacerbation, and 1-2 comorbidities: MS anxiety are also affecting patient's functional outcome.  REHAB POTENTIAL: Good CLINICAL DECISION MAKING: Evolving/moderate complexity EVALUATION COMPLEXITY: Moderate  PLAN:  PT FREQUENCY: 2x/week PT DURATION: 12 weeks PLANNED INTERVENTIONS: 97164- PT Re-evaluation, 97110-Therapeutic exercises, 97530- Therapeutic activity, 97112- Neuromuscular re-education, 97535- Self Care, [id number]- Manual therapy, [phone]- Gait training, [phone]- Orthotic Fit/training, [phone]- Canalith repositioning, Patient/Family education, Balance training, Stair training, Taping, Dry Needling, Joint mobilization, Joint manipulation, Spinal mobilization, Vestibular training, Visual/preceptual remediation/compensation, DME instructions, Cryotherapy, and Moist heat  PLAN FOR NEXT SESSION:     airex pad/balance, LLE strengthening, HEP , LLE coordination. Adding cognitive/manual dual task with dynamic balance activities    Fonda Simpers, PT, DPT Physical Therapist - [facility]  04/30/24, 1:19 PM   "

## 2024-05-01 ENCOUNTER — Ambulatory Visit

## 2024-05-05 ENCOUNTER — Ambulatory Visit

## 2024-05-06 ENCOUNTER — Ambulatory Visit

## 2024-05-07 ENCOUNTER — Ambulatory Visit

## 2024-05-11 ENCOUNTER — Ambulatory Visit

## 2024-05-12 ENCOUNTER — Ambulatory Visit

## 2024-05-14 ENCOUNTER — Ambulatory Visit

## 2024-05-19 ENCOUNTER — Ambulatory Visit

## 2024-05-21 ENCOUNTER — Ambulatory Visit

## 2024-05-26 ENCOUNTER — Ambulatory Visit

## 2024-05-27 ENCOUNTER — Encounter

## 2024-05-27 ENCOUNTER — Ambulatory Visit

## 2024-05-28 ENCOUNTER — Ambulatory Visit

## 2024-06-02 ENCOUNTER — Ambulatory Visit

## 2024-06-04 ENCOUNTER — Ambulatory Visit

## 2024-06-09 ENCOUNTER — Ambulatory Visit

## 2024-06-11 ENCOUNTER — Ambulatory Visit

## 2024-06-16 ENCOUNTER — Ambulatory Visit

## 2024-06-18 ENCOUNTER — Ambulatory Visit

## 2024-06-23 ENCOUNTER — Ambulatory Visit

## 2024-06-25 ENCOUNTER — Ambulatory Visit

## 2024-06-30 ENCOUNTER — Ambulatory Visit

## 2024-07-02 ENCOUNTER — Ambulatory Visit

## 2024-07-07 ENCOUNTER — Ambulatory Visit

## 2024-07-09 ENCOUNTER — Ambulatory Visit

## 2024-10-07 ENCOUNTER — Encounter: Admitting: Dermatology
